# Patient Record
Sex: Female | Born: 1945 | Race: White | Hispanic: No | Marital: Married | Smoking: Never smoker
Health system: Southern US, Community
[De-identification: ages and names within clinical notes are randomized; demographics above are authoritative.]

## PROBLEM LIST (undated history)

## (undated) DIAGNOSIS — E785 Hyperlipidemia, unspecified: Secondary | ICD-10-CM

## (undated) DIAGNOSIS — K219 Gastro-esophageal reflux disease without esophagitis: Secondary | ICD-10-CM

## (undated) DIAGNOSIS — M2550 Pain in unspecified joint: Secondary | ICD-10-CM

## (undated) DIAGNOSIS — J189 Pneumonia, unspecified organism: Secondary | ICD-10-CM

## (undated) DIAGNOSIS — T7840XA Allergy, unspecified, initial encounter: Secondary | ICD-10-CM

## (undated) DIAGNOSIS — IMO0001 Reserved for inherently not codable concepts without codable children: Secondary | ICD-10-CM

## (undated) DIAGNOSIS — I10 Essential (primary) hypertension: Secondary | ICD-10-CM

## (undated) DIAGNOSIS — K648 Other hemorrhoids: Secondary | ICD-10-CM

## (undated) DIAGNOSIS — J45909 Unspecified asthma, uncomplicated: Secondary | ICD-10-CM

## (undated) DIAGNOSIS — R42 Dizziness and giddiness: Secondary | ICD-10-CM

## (undated) DIAGNOSIS — Z9889 Other specified postprocedural states: Secondary | ICD-10-CM

## (undated) DIAGNOSIS — D649 Anemia, unspecified: Secondary | ICD-10-CM

## (undated) DIAGNOSIS — R000 Tachycardia, unspecified: Secondary | ICD-10-CM

## (undated) DIAGNOSIS — M1990 Unspecified osteoarthritis, unspecified site: Secondary | ICD-10-CM

## (undated) DIAGNOSIS — R0602 Shortness of breath: Secondary | ICD-10-CM

## (undated) DIAGNOSIS — J383 Other diseases of vocal cords: Secondary | ICD-10-CM

## (undated) DIAGNOSIS — G4730 Sleep apnea, unspecified: Secondary | ICD-10-CM

## (undated) DIAGNOSIS — R112 Nausea with vomiting, unspecified: Secondary | ICD-10-CM

## (undated) DIAGNOSIS — E669 Obesity, unspecified: Secondary | ICD-10-CM

## (undated) DIAGNOSIS — K829 Disease of gallbladder, unspecified: Secondary | ICD-10-CM

## (undated) DIAGNOSIS — F329 Major depressive disorder, single episode, unspecified: Secondary | ICD-10-CM

## (undated) DIAGNOSIS — F32A Depression, unspecified: Secondary | ICD-10-CM

## (undated) DIAGNOSIS — K589 Irritable bowel syndrome without diarrhea: Secondary | ICD-10-CM

## (undated) DIAGNOSIS — E538 Deficiency of other specified B group vitamins: Secondary | ICD-10-CM

## (undated) DIAGNOSIS — I480 Paroxysmal atrial fibrillation: Secondary | ICD-10-CM

## (undated) DIAGNOSIS — M549 Dorsalgia, unspecified: Secondary | ICD-10-CM

## (undated) DIAGNOSIS — R1310 Dysphagia, unspecified: Secondary | ICD-10-CM

## (undated) DIAGNOSIS — K5790 Diverticulosis of intestine, part unspecified, without perforation or abscess without bleeding: Secondary | ICD-10-CM

## (undated) DIAGNOSIS — K5900 Constipation, unspecified: Secondary | ICD-10-CM

## (undated) DIAGNOSIS — Z9289 Personal history of other medical treatment: Secondary | ICD-10-CM

## (undated) DIAGNOSIS — R079 Chest pain, unspecified: Secondary | ICD-10-CM

## (undated) DIAGNOSIS — F419 Anxiety disorder, unspecified: Secondary | ICD-10-CM

## (undated) DIAGNOSIS — E559 Vitamin D deficiency, unspecified: Secondary | ICD-10-CM

## (undated) DIAGNOSIS — I200 Unstable angina: Secondary | ICD-10-CM

## (undated) HISTORY — DX: Dorsalgia, unspecified: M54.9

## (undated) HISTORY — DX: Anemia, unspecified: D64.9

## (undated) HISTORY — DX: Disease of gallbladder, unspecified: K82.9

## (undated) HISTORY — DX: Dizziness and giddiness: R42

## (undated) HISTORY — PX: ANKLE FUSION: SHX881

## (undated) HISTORY — DX: Vitamin D deficiency, unspecified: E55.9

## (undated) HISTORY — DX: Dysphagia, unspecified: R13.10

## (undated) HISTORY — DX: Constipation, unspecified: K59.00

## (undated) HISTORY — PX: ELBOW SURGERY: SHX618

## (undated) HISTORY — PX: TOTAL KNEE ARTHROPLASTY: SHX125

## (undated) HISTORY — PX: COLONOSCOPY: SHX174

## (undated) HISTORY — DX: Unspecified osteoarthritis, unspecified site: M19.90

## (undated) HISTORY — DX: Diverticulosis of intestine, part unspecified, without perforation or abscess without bleeding: K57.90

## (undated) HISTORY — PX: TOTAL HIP ARTHROPLASTY: SHX124

## (undated) HISTORY — DX: Shortness of breath: R06.02

## (undated) HISTORY — DX: Chest pain, unspecified: R07.9

## (undated) HISTORY — PX: WRIST SURGERY: SHX841

## (undated) HISTORY — PX: BACK SURGERY: SHX140

## (undated) HISTORY — DX: Deficiency of other specified B group vitamins: E53.8

## (undated) HISTORY — DX: Reserved for inherently not codable concepts without codable children: IMO0001

## (undated) HISTORY — DX: Irritable bowel syndrome, unspecified: K58.9

## (undated) HISTORY — PX: TOTAL ANKLE REPLACEMENT: SUR1218

## (undated) HISTORY — DX: Tachycardia, unspecified: R00.0

## (undated) HISTORY — PX: SHOULDER SURGERY: SHX246

## (undated) HISTORY — DX: Other hemorrhoids: K64.8

## (undated) HISTORY — DX: Obesity, unspecified: E66.9

## (undated) HISTORY — PX: CHOLECYSTECTOMY: SHX55

## (undated) HISTORY — DX: Allergy, unspecified, initial encounter: T78.40XA

## (undated) HISTORY — PX: JOINT REPLACEMENT: SHX530

## (undated) HISTORY — PX: OTHER SURGICAL HISTORY: SHX169

## (undated) HISTORY — DX: Pain in unspecified joint: M25.50

## (undated) HISTORY — DX: Paroxysmal atrial fibrillation: I48.0

## (undated) HISTORY — PX: FOOT OSTEOTOMY W/ PLANTAR FASCIA RELEASE: SHX1665

---
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
## 1986-03-22 HISTORY — PX: ABDOMINAL HYSTERECTOMY: SHX81

## 1995-03-23 HISTORY — PX: NASAL SINUS SURGERY: SHX719

## 1995-03-23 HISTORY — PX: ILIOTIBIAL BAND RELEASE: SHX675

## 1995-03-23 HISTORY — PX: OTHER SURGICAL HISTORY: SHX169

## 1997-06-20 ENCOUNTER — Ambulatory Visit (HOSPITAL_BASED_OUTPATIENT_CLINIC_OR_DEPARTMENT_OTHER): Admission: RE | Admit: 1997-06-20 | Discharge: 1997-06-20 | Payer: Self-pay | Admitting: *Deleted

## 1997-10-07 ENCOUNTER — Ambulatory Visit (HOSPITAL_COMMUNITY): Admission: RE | Admit: 1997-10-07 | Discharge: 1997-10-07 | Payer: Self-pay

## 1998-12-24 ENCOUNTER — Ambulatory Visit (HOSPITAL_COMMUNITY): Admission: RE | Admit: 1998-12-24 | Discharge: 1998-12-24 | Payer: Self-pay | Admitting: Orthopedic Surgery

## 1999-10-03 ENCOUNTER — Emergency Department (HOSPITAL_COMMUNITY): Admission: EM | Admit: 1999-10-03 | Discharge: 1999-10-03 | Payer: Self-pay | Admitting: Emergency Medicine

## 1999-10-03 ENCOUNTER — Encounter: Payer: Self-pay | Admitting: Emergency Medicine

## 2000-06-09 ENCOUNTER — Encounter: Payer: Self-pay | Admitting: Orthopedic Surgery

## 2000-06-14 ENCOUNTER — Inpatient Hospital Stay (HOSPITAL_COMMUNITY): Admission: RE | Admit: 2000-06-14 | Discharge: 2000-06-18 | Payer: Self-pay | Admitting: Orthopedic Surgery

## 2000-07-28 ENCOUNTER — Encounter: Admission: RE | Admit: 2000-07-28 | Discharge: 2000-07-28 | Payer: Self-pay | Admitting: Endocrinology

## 2000-07-28 ENCOUNTER — Encounter: Payer: Self-pay | Admitting: Endocrinology

## 2001-10-04 ENCOUNTER — Other Ambulatory Visit: Admission: RE | Admit: 2001-10-04 | Discharge: 2001-10-04 | Payer: Self-pay | Admitting: *Deleted

## 2002-09-07 ENCOUNTER — Encounter: Payer: Self-pay | Admitting: Gastroenterology

## 2002-09-07 ENCOUNTER — Ambulatory Visit (HOSPITAL_COMMUNITY): Admission: RE | Admit: 2002-09-07 | Discharge: 2002-09-07 | Payer: Self-pay | Admitting: Gastroenterology

## 2004-03-12 ENCOUNTER — Ambulatory Visit (HOSPITAL_COMMUNITY): Admission: RE | Admit: 2004-03-12 | Discharge: 2004-03-12 | Payer: Self-pay | Admitting: Endocrinology

## 2005-03-31 ENCOUNTER — Ambulatory Visit (HOSPITAL_COMMUNITY): Admission: RE | Admit: 2005-03-31 | Discharge: 2005-03-31 | Payer: Self-pay | Admitting: Orthopedic Surgery

## 2005-07-23 ENCOUNTER — Encounter: Admission: RE | Admit: 2005-07-23 | Discharge: 2005-07-23 | Payer: Self-pay | Admitting: Neurosurgery

## 2006-07-25 ENCOUNTER — Encounter: Admission: RE | Admit: 2006-07-25 | Discharge: 2006-07-25 | Payer: Self-pay | Admitting: Orthopedic Surgery

## 2007-10-31 ENCOUNTER — Encounter: Admission: RE | Admit: 2007-10-31 | Discharge: 2007-10-31 | Payer: Self-pay | Admitting: Endocrinology

## 2008-01-17 ENCOUNTER — Ambulatory Visit (HOSPITAL_COMMUNITY): Admission: RE | Admit: 2008-01-17 | Discharge: 2008-01-17 | Payer: Self-pay | Admitting: Gastroenterology

## 2008-05-07 ENCOUNTER — Encounter (INDEPENDENT_AMBULATORY_CARE_PROVIDER_SITE_OTHER): Payer: Self-pay | Admitting: General Surgery

## 2008-05-07 ENCOUNTER — Ambulatory Visit (HOSPITAL_COMMUNITY): Admission: RE | Admit: 2008-05-07 | Discharge: 2008-05-07 | Payer: Self-pay | Admitting: General Surgery

## 2008-07-25 ENCOUNTER — Encounter: Admission: RE | Admit: 2008-07-25 | Discharge: 2008-07-25 | Payer: Self-pay | Admitting: Orthopedic Surgery

## 2010-02-17 ENCOUNTER — Encounter: Admission: RE | Admit: 2010-02-17 | Discharge: 2010-02-17 | Payer: Self-pay | Admitting: Allergy and Immunology

## 2010-04-08 ENCOUNTER — Encounter
Admission: RE | Admit: 2010-04-08 | Discharge: 2010-04-21 | Payer: Self-pay | Source: Home / Self Care | Attending: Otolaryngology | Admitting: Otolaryngology

## 2010-04-24 ENCOUNTER — Ambulatory Visit: Payer: BC Managed Care – PPO | Attending: Otolaryngology

## 2010-04-24 DIAGNOSIS — IMO0001 Reserved for inherently not codable concepts without codable children: Secondary | ICD-10-CM | POA: Insufficient documentation

## 2010-04-24 DIAGNOSIS — R498 Other voice and resonance disorders: Secondary | ICD-10-CM | POA: Insufficient documentation

## 2010-06-09 ENCOUNTER — Encounter (HOSPITAL_COMMUNITY)
Admission: RE | Admit: 2010-06-09 | Discharge: 2010-06-09 | Disposition: A | Discharge status: Home / Self Care | Payer: BC Managed Care – PPO | Source: Ambulatory Visit | Attending: Orthopedic Surgery | Admitting: Orthopedic Surgery

## 2010-06-09 LAB — DIFFERENTIAL
Basophils Relative: 0 % (ref 0–1)
Eosinophils Relative: 2 % (ref 0–5)
Lymphs Abs: 1.6 10*3/uL (ref 0.7–4.0)
Monocytes Absolute: 0.2 10*3/uL (ref 0.1–1.0)
Monocytes Relative: 5 % (ref 3–12)
Neutrophils Relative %: 59 % (ref 43–77)

## 2010-06-09 LAB — CBC
Hemoglobin: 12.7 g/dL (ref 12.0–15.0)
MCHC: 32.9 g/dL (ref 30.0–36.0)
MCV: 88.3 fL (ref 78.0–100.0)
RBC: 4.37 MIL/uL (ref 3.87–5.11)
WBC: 4.7 10*3/uL (ref 4.0–10.5)

## 2010-06-09 LAB — COMPREHENSIVE METABOLIC PANEL
AST: 25 U/L (ref 0–37)
Alkaline Phosphatase: 80 U/L (ref 39–117)
BUN: 14 mg/dL (ref 6–23)
Calcium: 9.3 mg/dL (ref 8.4–10.5)
GFR calc Af Amer: >60 mL/min (ref >60)
Glucose, Bld: 99 mg/dL (ref 70–99)
Total Bilirubin: 0.4 mg/dL (ref 0.3–1.2)
Total Protein: 6.2 g/dL (ref 6.0–8.3)

## 2010-06-09 LAB — URINALYSIS, ROUTINE W REFLEX MICROSCOPIC
Bilirubin Urine: NEGATIVE
Ketones, ur: NEGATIVE mg/dL
Leukocytes, UA: NEGATIVE
Nitrite: NEGATIVE
pH: 5.5 (ref 5.0–8.0)

## 2010-06-09 LAB — PROTIME-INR: INR: 0.91 (ref 0.00–1.49)

## 2010-06-09 LAB — URINE MICROSCOPIC-ADD ON

## 2010-06-09 LAB — ABO/RH: ABO/RH(D): A POS

## 2010-06-10 LAB — URINE CULTURE

## 2010-06-12 ENCOUNTER — Inpatient Hospital Stay (HOSPITAL_COMMUNITY): Payer: BC Managed Care – PPO

## 2010-06-12 ENCOUNTER — Inpatient Hospital Stay (HOSPITAL_COMMUNITY)
Admission: RE | Admit: 2010-06-12 | Discharge: 2010-06-15 | DRG: 209 | Disposition: A | Discharge status: Home Health | Payer: BC Managed Care – PPO | Source: Ambulatory Visit | Attending: Orthopedic Surgery | Admitting: Orthopedic Surgery

## 2010-06-12 DIAGNOSIS — I10 Essential (primary) hypertension: Secondary | ICD-10-CM | POA: Diagnosis present

## 2010-06-12 DIAGNOSIS — K219 Gastro-esophageal reflux disease without esophagitis: Secondary | ICD-10-CM | POA: Diagnosis present

## 2010-06-12 DIAGNOSIS — J449 Chronic obstructive pulmonary disease, unspecified: Secondary | ICD-10-CM | POA: Diagnosis present

## 2010-06-12 DIAGNOSIS — M1710 Unilateral primary osteoarthritis, unspecified knee: Principal | ICD-10-CM | POA: Diagnosis present

## 2010-06-12 DIAGNOSIS — J4489 Other specified chronic obstructive pulmonary disease: Secondary | ICD-10-CM | POA: Diagnosis present

## 2010-06-12 DIAGNOSIS — Z6841 Body Mass Index (BMI) 40.0 and over, adult: Secondary | ICD-10-CM

## 2010-06-12 DIAGNOSIS — R32 Unspecified urinary incontinence: Secondary | ICD-10-CM | POA: Diagnosis present

## 2010-06-13 LAB — CBC
MCH: 28.9 pg (ref 26.0–34.0)
MCHC: 32.3 g/dL (ref 30.0–36.0)
MCV: 89.4 fL (ref 78.0–100.0)
RBC: 3.39 MIL/uL — ABNORMAL LOW (ref 3.87–5.11)

## 2010-06-13 LAB — BASIC METABOLIC PANEL
Calcium: 8 mg/dL — ABNORMAL LOW (ref 8.4–10.5)
Chloride: 106 mEq/L (ref 96–112)
GFR calc non Af Amer: >60 mL/min (ref >60)
Potassium: 4.4 mEq/L (ref 3.5–5.1)

## 2010-06-14 LAB — CBC
Hemoglobin: 8.8 g/dL — ABNORMAL LOW (ref 12.0–15.0)
MCH: 29.4 pg (ref 26.0–34.0)
MCHC: 33.1 g/dL (ref 30.0–36.0)
MCV: 89 fL (ref 78.0–100.0)
Platelets: 96 10*3/uL — ABNORMAL LOW (ref 150–400)
RBC: 2.99 MIL/uL — ABNORMAL LOW (ref 3.87–5.11)
RDW: 13.2 % (ref 11.5–15.5)
WBC: 5.4 10*3/uL (ref 4.0–10.5)

## 2010-06-14 LAB — BASIC METABOLIC PANEL
BUN: 3 mg/dL — ABNORMAL LOW (ref 6–23)
CO2: 29 mEq/L (ref 19–32)
Calcium: 7.7 mg/dL — ABNORMAL LOW (ref 8.4–10.5)
Creatinine, Ser: 0.73 mg/dL (ref 0.4–1.2)
GFR calc non Af Amer: >60 mL/min (ref >60)
Potassium: 3.5 mEq/L (ref 3.5–5.1)

## 2010-06-15 LAB — CBC
Hemoglobin: 9.8 g/dL — ABNORMAL LOW (ref 12.0–15.0)
MCH: 28.8 pg (ref 26.0–34.0)
MCHC: 32.9 g/dL (ref 30.0–36.0)
Platelets: 108 10*3/uL — ABNORMAL LOW (ref 150–400)
RBC: 3.4 MIL/uL — ABNORMAL LOW (ref 3.87–5.11)
RDW: 13.5 % (ref 11.5–15.5)

## 2010-06-15 LAB — TYPE AND SCREEN
ABO/RH(D): A POS
Antibody Screen: NEGATIVE

## 2010-06-15 LAB — BASIC METABOLIC PANEL
BUN: 5 mg/dL — ABNORMAL LOW (ref 6–23)
CO2: 29 mEq/L (ref 19–32)
Chloride: 104 mEq/L (ref 96–112)
Creatinine, Ser: 0.73 mg/dL (ref 0.4–1.2)
Glucose, Bld: 116 mg/dL — ABNORMAL HIGH (ref 70–99)
Potassium: 3.5 mEq/L (ref 3.5–5.1)

## 2010-07-07 LAB — URINALYSIS, ROUTINE W REFLEX MICROSCOPIC
Bilirubin Urine: NEGATIVE
Glucose, UA: NEGATIVE mg/dL
Ketones, ur: NEGATIVE mg/dL
Protein, ur: NEGATIVE mg/dL
pH: 5.5 (ref 5.0–8.0)

## 2010-07-07 LAB — URINE MICROSCOPIC-ADD ON

## 2010-07-07 LAB — COMPREHENSIVE METABOLIC PANEL
ALT: 14 U/L (ref 0–35)
Albumin: 3.5 g/dL (ref 3.5–5.2)
Alkaline Phosphatase: 79 U/L (ref 39–117)
BUN: 16 mg/dL (ref 6–23)
Chloride: 107 mEq/L (ref 96–112)
Glucose, Bld: 101 mg/dL — ABNORMAL HIGH (ref 70–99)
Potassium: 4.1 mEq/L (ref 3.5–5.1)
Sodium: 138 mEq/L (ref 135–145)
Total Bilirubin: 0.5 mg/dL (ref 0.3–1.2)
Total Protein: 6 g/dL (ref 6.0–8.3)

## 2010-07-07 LAB — CBC
HCT: 36.9 % (ref 36.0–46.0)
Hemoglobin: 12.6 g/dL (ref 12.0–15.0)
RDW: 12.7 % (ref 11.5–15.5)
WBC: 5.4 10*3/uL (ref 4.0–10.5)

## 2010-07-07 LAB — DIFFERENTIAL
Basophils Absolute: 0 10*3/uL (ref 0.0–0.1)
Basophils Relative: 1 % (ref 0–1)
Eosinophils Absolute: 0.1 10*3/uL (ref 0.0–0.7)
Monocytes Absolute: 0.3 10*3/uL (ref 0.1–1.0)
Monocytes Relative: 6 % (ref 3–12)
Neutrophils Relative %: 56 % (ref 43–77)

## 2010-07-20 NOTE — Op Note (Signed)
NAMESARYA, LINENBERGER             ACCOUNT NO.:  [id number]  MEDICAL RECORD NO.:  [id number]           PATIENT TYPE:  I  LOCATION:  5039                         FACILITY:  [facility]  PHYSICIAN:  Dyke Brackett, M.D.    DATE OF BIRTH:  [birth year]  DATE OF PROCEDURE:  06/12/2010 DATE OF DISCHARGE:                              OPERATIVE REPORT   PREOPERATIVE DIAGNOSES: 1. Severe osteoarthritis valgus deformity, right knee. 2. Morbid obesity with body mass index greater than 40.  POSTOPERATIVE DIAGNOSES: 1. Severe osteoarthritis valgus deformity, right knee. 2. Morbid obesity with body mass index greater than 40.  OPERATION:  Right total knee replacement (DePuy Sigma knee cemented size 3 femur, tibia, 10-mm bearing, 3 peg all poly patella).  SURGEON:  Dyke Brackett, MD  ASSISTANTSu Hilt, PA  TOURNIQUET TIME:  1 hour and 25 minutes.  DESCRIPTION OF PROCEDURE:  The patient was noted to be technically more difficult secondary to a significant elevation of her BMI as indicated above made the technical difficulty of the case higher, however, we did not have any significant complications throughout the case.  She was approached through a straight skin incision, medial parapatellar approach to the knee.  A straight skin incision was made with medial parapatellar approach.  We identified the most diseased lateral compartment.  We first placed a hole in the femur with placement of the distal femoral cutting guide, cut with 10 mm resection with minimal cutting of the deficient lateral condyle.  Attention was next directed to the tibia.  Using the external guide, we set the appropriate degree with valgus and we actually decided to use a 3-degree valgus for the femoral cut, cut about 2 mm below the most diseased lateral compartment after excising the anterior horns of menisci and a 2-degree slope for the MBT tray.  I completed this cut without difficulty, then checked the extension gap  which was symmetric at 10 mm with full extension.  We next resected remnants of the menisci and the posterior cruciate ligament was sacrificed.  The femur was trialed to size 3, followed by placement of the guide through the femur.  We then placed a C clamp after fixing the provisional cutting guide for the pin holes to the flange in the femur making care that this was set not to notch the femur.  It did set the anterior and posterior cutting.  We then placed the C clamp to set the rotation and external rotation.  Two pins were placed followed by replacement back guide with the 5-in-1 cutting guide. The 5-in-1 cutting guide was used to resect anterior and posterior femoral with chamfer cuts.  We then put a cruciate retaining trial to check and then we did have some posterior bone particularly on the lateral side that we resected, recessed the capsule and removed several loose bodies from the posterior medial and intercondylar area of the knee.  On the tibial side, we elected due to the patient's high BMI to do revision MBT tray.  We sized it to be 3, placed the keel punch for the revision tray, followed by putting the trial in  the tibia.  The patella itself showed significant wear with almost deficiency on the lateral side making it somewhat difficult to retain enough lateral thickness on the patella.  Therefore, we did a freehand technique trying to preserve about 15-16 mm of the patella to make a total of patellar thickness based on the original thickness of 25.  We placed a  38-mm patella trial once this was done and checked all parameters with all trials.  Full extension and flexion was noted to be stable.  No tendency to bearing spin-out was noted and good balance.  Trials were removed.  The bony surfaces were irrigated and the final components were put in with the tourniquet up, tibia followed by femur and patella.  We did elect to put a trial bearing back in.  Cement  was allowed to harden.  Once the cement was hardened, we removed the trial peg and trial bearing.  Tourniquet was released.  We first for any excess bone in the back and did not see any.  Then, the tourniquet was released.  No excess bleeding was noted in the posterior aspect of the knee.  All bleeders were coagulated.  Closure was effected after Hemovac drain placed superolaterally and we closed with #1 Ethibond to the capsule, 0 and 2-0 Vicryl, skin clips and placed some Marcaine with epinephrine into the skin.  She also had a preoperative femoral nerve block.     Dyke Brackett, M.D.     WDC/MEDQ  D:  06/12/2010  T:  06/13/2010  Job:  [id number]  Electronically Signed by W. Jamara Vary M.D. on 07/20/2010 05:11:13 PM

## 2010-08-04 NOTE — Op Note (Signed)
NAMEJIMMY, Green             ACCOUNT NO.:  [id number]   MEDICAL RECORD NO.:  [id number]          PATIENT TYPE:  AMB   LOCATION:  DAY                          FACILITY:  [facility]   PHYSICIAN:  Sharlet Salina T. Hoxworth, M.D.DATE OF BIRTH:  Jul 25, 1945   DATE OF PROCEDURE:  05/07/2008  DATE OF DISCHARGE:                               OPERATIVE REPORT   PREOPERATIVE DIAGNOSIS:  Biliary dyskinesia/chronic cholecystitis.   POSTOPERATIVE DIAGNOSIS:  Biliary dyskinesia/chronic cholecystitis.   SURGICAL PROCEDURE:  Laparoscopic cholecystectomy with intraoperative  cholangiogram.   ASSISTANT:  Velora Heckler, M.D.   ANESTHESIA:  General.   BRIEF HISTORY:  [name] is a 65 year old female who presents  with persistent episodic right upper quadrant and epigastric abdominal  pain associated with nausea and vomiting.  Her symptoms are very typical  for severe biliary colic, although she has had a negative abdominal  ultrasound and CT scan of the abdomen.  Due to persistent symptoms  despite negative workup, she is felt to have biliary dyskinesia or  chronic cholecystitis and laparoscopic cholecystectomy with  cholangiogram has been recommended in an effort to relieve her symptoms.  The nature of the procedure, its indications, risks of anesthetic  complications, bleeding, infection, bile leak, bile duct injury, and  failure to relieve her symptoms have been discussed and understood.  She  is now brought to the operating room for this procedure.   DESCRIPTION OF OPERATION:  The patient was brought to the operating  room, placed in supine position on the operating table, and general  endotracheal anesthesia was induced.  The abdomen was widely sterilely  prepped and draped.  PAS's were in place.  She received preoperative IV  antibiotics.  Correct patient and procedure were verified.  Local  anesthesia was used to infiltrate the trocar sites.  A 1.5-cm incision  was made at the umbilicus  and dissection carried down to the midline  fascia, which was sharply incised for 1 cm transversely and the  peritoneum entered under direct vision.  Through a mattress suture of 0  Vicryl, the Hasson trocar was placed and pneumoperitoneum established.  Under direct vision, an 11-mm trocar was placed in the epigastrium and  two 5-mm trocars along the right subcostal margin.   The gallbladder was exposed and grossly did not have evidence of  inflammation.  The fundus was grasped and elevated up over the liver and  the infundibulum retracted inferolaterally.  Peritoneum anterior and  posterior to Calot triangle was incised and fibrofatty tissue was  stripped off the neck of the gallbladder toward the porta hepatis.  The  distal gallbladder was thoroughly dissected.  The anterior branch of the  cystic artery was clearly identified coursing up to the gallbladder wall  and was divided between 2 proximal and 1 distal clip.   The cystic duct was then identified and dissected free, Calot triangle  skeletonized, and the cystic duct/gallbladder junction dissected 360  degrees.  When the anatomy appeared clear, the cystic duct was clipped  at the gallbladder junction and an operative cholangiogram obtained  through the cystic duct.  This showed good  visualization of a normal  common bile duct and intrahepatic ducts with free flow into the duodenum  and no filling defects.  Following this, the cholangiocath was removed  and the cystic duct was doubly clipped proximally and divided.  The  gallbladder was then dissected free from its bed using hook cautery.  A  posterior branch of the cystic artery well up on the gallbladder bed was  clipped.  The gallbladder was dissected off the liver intact and removed  through the umbilicus.   Complete hemostasis was assured.  Trocars were removed under direct  vision and all CO2 evacuated.  The mattress suture was secured to the  umbilicus.  Skin incisions were  closed with subcuticular Monocryl and  Dermabond.  Sponge, needle, and instrument counts were correct.  The  patient was taken to recovery in good condition.      [name]. Hoxworth, M.D.  Electronically Signed     BTH/MEDQ  D:  05/07/2008  T:  05/07/2008  Job:  [id number]

## 2010-08-06 NOTE — Discharge Summary (Signed)
[name], [name]             ACCOUNT NO.:  [id number]  MEDICAL RECORD NO.:  [id number]           PATIENT TYPE:  I  LOCATION:  5039                         FACILITY:  [facility]  PHYSICIAN:  Dyke Brackett, M.D.    DATE OF BIRTH:  [birth year]  DATE OF ADMISSION:  06/12/2010 DATE OF DISCHARGE:  06/15/2010                              DISCHARGE SUMMARY   DIAGNOSIS:  Severe osteoarthritis right knee.  SECONDARY DIAGNOSES: 1. Morbid obesity with BMI greater than 40. 2. Asthma/chronic obstructive pulmonary disease. 3. Urinary retention, 4. Vocal cord injury. 5. Hypertension. 6. Gastroesophageal reflux disease.  PROCEDURE IN HOSPITAL:  Right total knee arthroplasty.  DISCHARGE SUMMARY:  The patient is a 65 year old woman with a previous left total knee arthroplasty for severe osteoarthritis of her knee.  She has a many year history of bilateral knee pain and after undergoing her successful left total knee arthroplasty in 2002 has been considering one on the right.  Her pain has now increased in the right knee to the point where it interferes with activities of daily living, interferes with sleep and is worsening making ambulation difficult and at times unsafe. She has failed conservative care,  rest and physical therapy and wishes to proceed with a total knee arthroplasty after discussing risks versus benefits.  MEDICAL HISTORY:  Significant for osteoarthritis, asthma, COPD, urinary retention, vocal cord injury with resulting speech difficulties that has been cleared for surgical procedure as far as relative risks of anesthesia, hypertension and GERD.  SURGICAL HISTORY:  Three surgeries on the right foot, plantar fascial release, right knee scope, left knee scope, left total knee arthroplasty, left hip scope, right thumb joint replacement, left wrist procedure, hysterectomy, sinus surgery, varicose vein stripping and lumbar disk surgeries, gallbladder, left shoulder surgery and  fatty tumor removal from left leg as teenager.  ALLERGIES:  CORTISONE INJECTIONS.  She can tolerate cortisone orally and she had a GI bleed from taking ANTI-INFLAMMATORIES.  PRIMARY MD:  Dr. Clelia Croft and she sees Dr. Coralyn Mark at [facility] for her vocal cord injury.  REVIEW OF SYSTEMS:  Positive for bridge work in her upper teeth, glasses and history of IBS.  PHYSICAL EXAMINATION:  VITAL SIGNS:  The patient's temperature 97.9, pulse of 85, blood pressure 138/95.  She is 5 feet 3-1/2 inch, 246 woman. HEAD:  Normocephalic, atraumatic. NECK:  Full range of motion. EYES:  Pupils equal, round and reactive to light and accommodation. NARES:  Pink. EARS: TMs are clear. CHEST:  Lungs clear to auscultation. CARDIAC:  Regular rate and rhythm. ABDOMEN:  Soft, nontender, obese. NEUROVASCULAR:  Intact with exception of her right foot which has decreased sensation along the lateral foot. SKIN:  Multiple scars from surgeries but no other abnormalities. MUSCULOSKELETAL:  Right knee range of motion 5-110 degrees with stable ligaments, positive crepitus, questionable effusion, 5-10 degrees varus deformity.  X-rays show bone-on-bone lateral compartment with osteophytes throughout.  MEDICATIONS:  At the time of admission: 1. Hydrocodone 1 tablet as needed at bedtime. 2. Tylenol No. 3 every 8 hours as needed for pain. 3. Tussionex Extended oral release suspension for cough. 4. Zyrtec 10 mg one p.o.  daily as needed. 5. Clorazepate 7.5 mg 1 tablet daily as needed. 6. ProAir albuterol inhaler 2 puffs every 4 hours as needed. 7. QVAR inhalation treatment 40 mg dose 2 puffs twice daily. 8. Nasonex nasal spray, 1 spray twice daily. 9. Ranitidine 300 mg p.o. daily at bedtime. 10.Omeprazole 20 mg p.o. b.i.d. 11.Simvastatin 1 tablet q.a.m. 12.Losartan 50 mg p.o. q.a.m.  The patient's preoperative labs including CBC, CMET, chest x-ray, EKG, PT and PTT were within acceptable levels.  On the day of admission,  the patient underwent a right total knee arthroplasty using DePuy Sigma cemented size 3 femur, tibia with a 10-mm bearing and a 3-peg all poly patella.  She was placed on perioperative antibiotics.  She was placed on postoperative prophylaxis using Lovenox.  Physical Therapy was begun in the recovery room with a CPM.  She was placed on CPM postoperatively for 16 hours per day first postoperative day.  HOSPITAL COURSE:  Continues the first postoperative day the patient complained of moderate pain.  Hemoglobin 9.8, WBC of 5.8, T-max 99.0, blood pressure 120/73.  She was alert and oriented x3.  She was neurovascularly intact at her baseline level.  Calf was soft and nontender and she was tolerating a CPM well.  She showed no signs or symptoms from her acute blood loss anemia but continued to monitor. Physical Therapy was continued.  Weightbearing as tolerated. Postoperative day #2, the patient's T-max 98.2.  Pain had decreased. Hemoglobin 8.8.  Alert and oriented x3, neurovascularly intact at her baseline level 0 to 90 degrees with CPM, otherwise, stable, continuing postoperative course.  Postoperative day #3, the patient pain's level had settled to 3/10.  She was tolerating her diet, voiding without difficulty, was not having bowel yet did have positive flatus without pain or distention.  Hemoglobin 9.8, blood pressure 109/31.  WBC 6.3. Wound was clean and dry.  Calf was soft and nontender.  She continued to tolerate a CPM and physical therapy and she was medically stable and improving with physical therapy and medically she was discharged home to the care of her family on that day.  She will return to see Dr. Madelon Lips in 10 days time for follow-up appointment or sooner should she have any increasing pain and the drainage from the wound or temperature greater than 101.  She will continue with physical therapy at home with home health PT as well as CPM 6-8 hours per day, 0-90 degrees.   Weightbearing as tolerated using a walker.  Dressing changes daily or as needed remained clean and dry.  MEDICATIONS:  At the time of discharge: 1. Cepacol throat lozenges as needed for sore throat. 2. Docusate sodium 100 mg b.i.d. 3. Lovenox 30 mg injection subcu b.i.d. for a total of 10 days     postoperatively. 4. Methocarbamol 500 mg p.o. q.6 hours as needed for spasm. 5. Percocet 5/325 1-2 tablets by mouth every 4 hours as needed for     pain. 6. Senokot 1 tablet by mouth at bedtime as needed for constipation. 7. Clorazepate 7.5 mg one p.o. daily as needed for anxiety. 8. Losartan 50 mg one p.o. daily q.a.m. 9. Nasonex nasal spray 1 spray nasally twice daily. 10.Omeprazole 20 mg 1 capsule by mouth b.i.d. 11.Albuterol inhaler 2 puffs inhaled every 4 hours as needed. 12.QVAR inhalation 2 puffs inhaled twice daily. 13.Ranitidine one p.o. at bedtime. 14.Simvastatin 20 mg one p.o. q.a.m. 15.Tussionex Extended Release oral suspension 1-2 teaspoons by mouth     every 12 hours  as needed for cough. 16.Zyrtec 10 mg 1 tablet daily as needed.     Laural Benes. Jannet Mantis   ______________________________ Dyke Brackett, M.D.    JBR/MEDQ  D:  07/22/2010  T:  07/23/2010  Job:  [id number]  Electronically Signed by Dan Humphreys P.A. on 08/03/2010 01:29:32 PM Electronically Signed by Lacretia Nicks. Glenda Kunst M.D. on 08/06/2010 11:13:00 AM

## 2010-08-07 NOTE — Discharge Summary (Signed)
[place]  Patient:    [name], [name]                    MRN: [id number] Adm. Date:  [id number] Disc. Date: [id number] Attending:  Cornell Barman Dictator:   Jamelle Rushing, P.A.                           Discharge Summary  ADMISSION DIAGNOSES: 1. Osteoarthritis of bilateral knees, left greater than right. 2. Hypertension. 3. Obesity. 4. Asthma. 5. Chronic pain.  DISCHARGE DIAGNOSES: 1. Left total knee arthroplasty. 2. Postoperative blood loss anemia. 3. Hypertension. 4. Obesity. 5. Asthma. 6. Esophageal reflux.  HISTORY OF PRESENT ILLNESS:  The patient is a 65 year old white female with a history of five to six years of off and on bilateral knee pain.  The patient had her knees go out in September of 2000 as a result of an injury and had arthroscopic evaluation and debridement with slight improvement.  The pain gradually returned over time.  The patient did have Hyalgan injections bilaterally with improvement in the right knee, but none in the left.  As time continued, the pain did significantly worsen.  A second attempt with Hyalgan injections gave her only short-term improvement.  The patient describes the pain currently as a constant ache with occasional knife-like stabbing pains. The pain is present in the posterolateral and in the anterior middle part of the knee with no radiation.  She does have a sensation like the knee will give out.  She does have popping, grinding, and swelling of the knees.  She is currently using a cane and crutches on a p.r.n. basis.  ALLERGIES:  No known drug allergies.  CURRENT MEDICATIONS: 1. Celebrex 100 mg p.o. b.i.d. 2. Paxil. 3. Atenolol. 4. Darvocet. 5. Esterase patch. 6. Prilosec. 7. Claritin. 8. Albuterol multidose inhaler.  SURGICAL PROCEDURE:  On June 14, 2000, the patient was taken to the OR by Thereasa Distance A. Chaney Malling, M.D., assisted by Jamelle Rushing, P.A.  Under  general anesthesia, the patient had a left total knee arthroplasty performed.  The patient had the following components glued in:  A standard sized left femoral component with a #3 cemented tibial keel tray with a 12.5 LSC standard sized insert and a three-peg cemented patellar rotating standard size.  The patient tolerated the procedure well.  There were no complications.  One Hemovac drain was left in place.  CONSULTS:  On June 14, 2000, the patient had the following routine consults requested:  Physical therapy, occupational therapy, rehabilitation, care management, and pharmacy for dosing of Lovenox for DVT prophylaxis.  HOSPITAL COURSE:  On June 14, 2000, the patient was admitted to [facility] under the care of O'Neill A. Chaney Malling, M.D.  The patient was taken to the OR where a left total knee arthroplasty was performed.  The patient tolerated the procedure well and was transferred to the recovery room and then to the orthopedic floor for further recuperative phases.  The patient then incurred a four-day postoperative course in which she did develop some postoperative blood loss anemia on postoperative day #1 with her hemoglobin and hematocrit dropping to 7.7 and 23.0, respectively.  The patient was transfused two units of autologous blood without any complications.  The patient worked well with physical therapy.  She tolerated the CPM very well. Her leg remained neuromuscular and vascularly intact and her wound benign. She  did not develop a Homans sign.  There were no other significant complications or problems incurred.  It was felt that on postoperative day #4 the patient had progressed nicely and she was safe to be able to be discharged to home with continued home health physical therapy.  So arrangements were made and she was discharged on postoperative day #4 to home.  LABORATORY DATA:  The EKG on admission was normal sinus rhythm with nonspecific T-wave  abnormalities at 84 beats per minute.  The CBC on June 17, 2000, showed WBC 6.1, hemoglobin 8.6, hematocrit 25.2, and platelets 145.  Routine chemistries on June 16, 2000, were sodium of 135, potassium of 3.6, glucose 148, BUN 11, and creatinine 0.9.  Routine urinalysis on admission was normal.  The patient received a total of two units of autologous blood during hospitalization.  MEDICATIONS ON DISCHARGE FROM THE ORTHOPEDIC FLOOR:  1. Tylenol 650 mg p.o. q.4h. p.r.n. temperature.  2. Albuterol MDI two puffs q.6h. p.r.n.  3. Tenormin 25 mg p.o. q.h.s.  4. Beclomethasone inhaler one puff q.d.  5. Dulcolax 10 mg p.o. q.d.  6. Colace 100 mg p.o. b.i.d.  7. Lovenox 300 mg subqu b.i.d.  8. Robaxin 500 mg p.o. q.6-8h. p.r.n. spasm.  9. Reglan 10 mg p.o. q.8h. p.r.n. 10. OxyContin CR 10 mg p.o. q.h.s. 11. Percocet one or two tablets every four to six hours p.r.n. pain. 12. Protonix 40 mg p.o. q.d. 13. Paxil 20 mg p.o. q.d. 14. Fleets enema p.r.n. 15. Restoril 15 mg p.o. q.h.s. p.r.n.  DISCHARGE INSTRUCTIONS: 1. Discharge medications:  The patient is to continue routine home    medications.    a. OxyContin CR 10 mg p.o. q.h.s.    b. Percocet one or two tablets every four to six hours for pain if needed.    c. Colace 100 mg p.o. b.i.d.    d. Lovenox per home health.    e. Atenolol 25 mg once a day. 2. Activity:  No restrictions. 3. Diet: No restrictions. 4. Wound care:  Keep the dressing dry. 5. Follow-up:  Call for a follow-up appointment two weeks from surgery.  CONDITION ON DISCHARGE TO HOME:  Listed as good. DD:  07/19/00 TD:  07/19/00 Job: [id number]

## 2010-08-07 NOTE — H&P (Signed)
[place]  Patient:    [name], [name]                 MRN: [id number] Adm. Date:  06/14/00 Attending:  Thereasa Distance A. Chaney Malling, M.D. Dictator:   Jamelle Rushing, P.A.                         History and Physical  DATE OF BIRTH:  [birth year]  CHIEF COMPLAINT:  Left knee pain for approximately 6-7 years.  HISTORY OF PRESENT ILLNESS:  Patient is a 65 year old white female with a 6-7 year history of off-and-on knee pain.  The patient had an injury in September 2000 in which her knee collapsed.  The patient had an arthroscopic debridement with some slight improvement.  Initially the patient did have some pain in bilateral knees so Hyalgan injections were attempted in both knees.  She had significant improvement in the right knee but no improvement in the left knee and the injections were discontinued after the third injection.  The patient continued to have worsening of her pain until June 2001 so Hyalgan injections once again were attempted and the patient did have some improvement of the pain in her left knee with the Hyalgan.  The pain once again progressively returned and it described as a constant aching sensation with occasional knife-like quality.  The patient can make the pain improve with manipulation of her knee.  The pain is primarily located in the posterolateral aspect but will radiate around to the anterior middle part of the knee with no radiation up or down the leg.  The patient does have a sensation that occasionally the knee will give out.  She does have a significant sharp popping quality to it with some constant grinding when she bends over to pick up something off the floor.  The patient has been using a cane since about June 2001 and she occasionally goes to a crutch when the pain is at its worst.  DRUG ALLERGIES:  No known drug allergies.  CURRENT MEDICATIONS:  Celebrex, Paxil, atenolol, Darvocet, Estrace patch, Prilosec,  Claritin, Albuterol MDI.  Patient will provide the dosages and scheduling on admission to the hospital.  MEDICAL HISTORY:  Patient has been diagnosed with hypertension over the last three years and initially was started on Maxzide but currently managed with the atenolol with fairly good results.  Patient is also diagnosed with asthma which is exacerbated with upper respiratory infections and exercise.  The patient has never been hospitalized or intubated to treat the asthma.  The patient is taking Paxil due to some chronic pains.  The patient specifically denies any diabetes, thyroid disease, hiatal hernia, peptic ulcers, or heart disease.  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  Patient denies any complications with any of the above-mentioned surgical procedures.  SOCIAL HISTORY:  Patient is a 65 year old white obese female who denies any history of smoking or alcohol use.  The patient is currently married with two grown children.  She does live in a one-story house and she is currently employed as a Programmer, systems.  FAMILY PHYSICIAN:  Dr. Alfonse Alpers. [place], [phone].  FAMILY MEDICAL HISTORY:  Mother is alive at 49 years of age with insulin-dependent  diabetes, hypertension, significant osteoarthritis.  The father is deceased at 69 from esophageal cancer.  Patient has one sister alive and in good health.  REVIEW OF SYSTEMS:  Positive for increased weight of 30 pounds since last June.  She contributes this to lack of activity due to significant problems with her right ankle fusion surgery and inability to exercise due to her left knee.  Patient does wear contact lenses and glasses.  Patient does have occasional shortness of breath with exertion she contributes to  her significant weight and deconditioning.  Patient does have occasional reflux which is well treated with the Prilosec.  Patient does have occasional increased urinary frequency which has improved since being taken off the Maxzide.  PHYSICAL EXAMINATION:  VITAL SIGNS:  Weight 300 pounds, height 5 feet 4 inches.  Pulse 80, respirations 12, temperature 98.4, blood pressure 128/82.  GENERAL:  This is a very pleasant, well-groomed, obese white female.  She does ambulate with the use of a cane in her right hand.  She does get on and off the exam table with a little bit of assistance but no significant discomfort. Patient did have some very slight shortness of breath upon entering the exam room from ambulation from the front lobby.  HEENT:  Head is normocephalic, atraumatic, nontender over maxillary or frontal sinuses.  Pupils are equal, round, and reactive, accommodating to light. Extraocular movements are intact.  Sclerae are not icteric.  Conjunctivae are pink and moist.  External ears are without deformities, canals patent, TMs pearly gray and intact.  Gross hearing is intact.  Nasal septum was midline. Mucous membranes pink and moist, no polyps noted.  Oral buccal mucosa was pink and moist.  Dentition was in good repair.  Uvula was midline, moved symmetrically with phonation.  Patient was able to swallow without any difficulty.  NECK:  Supple, no palpable lymphadenopathy.  Thyroid gland was nontender.  The patient had good range of motion of her cervical spine without any difficulty or tenderness.  CHEST:  Large from AP diameter and lateral diameter.  Lungs clear and equal bilaterally.  No wheezes, rales, rhonchi, or rubs noted.  HEART:  Regular rate and rhythm of S1 and S2 was auscultated.  No murmurs, rubs, or gallops noted.  ABDOMEN:  Obese, unable to palpate any hepatosplenomegaly.  It was soft, she had no tenderness with deep palpation in any quadrants.  There were  normal active bowel sounds throughout.  CVA was nontender to percussion.  EXTREMITIES:  Upper extremities:  Symmetrically sized and shaped with  excellent range of motion of her elbows, shoulder, and wrists.  She had 5/5 motor strength in all muscle groups tested.  Lower extremities:  Patient had full extension and flexion to approximately 90 degrees in the right and left hip with about 10-20 degrees internal-external rotation with no tenderness in either hip.  Patients lower extremities were extremely obese in the upper thighs which significantly limited range of motion of her knees and her hips.  Bilateral knees were symmetrically sized and shaped with no sign of erythema or ecchymosis.  They were rather large and obese and boggy appearing.  She had no palpable effusion.  She did have tenderness along the medial joint lines bilaterally.  She did have significant valgus-varus laxity with stressing, which was about 10 degrees on the left and approximately 15 degrees on the right.  The left leg had approximately a 10 degree valgus stress with no weight on it but would increase to approximately  20 degrees with weight bearing.  Right leg had a 20 degree valgus deformity without any weightbearing.  Bilateral calves were nontender.  She had trace lower extremity edema bilaterally.  Bilateral ankles were symmetrical with good dorsi and plantar flexion.  PERIPHERAL VASCULATURE:  Carotid pulses were 2+, no bruits.  Radial pulses were 1+ bilaterally.  Unable to palpate femoral pulses.  Dorsalis pedis pulses were 1+ bilaterally.  No lower extremity varicosities or venous stasis changes noted in the lower extremities.  NEUROLOGIC:  Patient was conscious, alert, and appropriate, held an easy conversation with the examiner.  Cranial nerves 2-12 were grossly intact. Deep tendon reflexes of the upper and lower extremities were grossly intact.  BREAST/RECTAL/GENITOURINARY:  Deferred at this  time.  IMPRESSION: 1. Significant osteoarthritis bilateral knees, left being greater than right. 2. Hypertension. 3. Obesity. 4. Asthma. 5. Chronic pain.  PLAN:  Patient will be admitted to [facility] on June 14, 2000. Patient will undergo all routine labs and tests prior to undergoing a left total knee arthroplasty by Dr. Chaney Malling.  The patient has donated 2 units of autologous blood. DD:  06/06/00 TD:  06/07/00 Job: [id number]

## 2010-08-07 NOTE — Op Note (Signed)
[place]  Patient:    [name], [name]                    MRN: [id number] Proc. Date: 06/14/00 Adm. Date:  [id number] Attending:  Cornell Barman                           Operative Report  PREOPERATIVE DIAGNOSIS:  Severe osteoarthritis, left knee.  POSTOPERATIVE DIAGNOSIS:  Severe osteoarthritis, left knee.  OPERATION PERFORMED:  Total knee replacement on the left.  All components were glued in.  A cemented standard size left femoral component with a #3 cemented tibial keel tray with a 12.5 mm LCS standard size insert and a three-peg cemented patella rotating standard size.  SURGEON:  Lenard Galloway. Chaney Malling, M.D.  ASSISTANT:  Jamelle Rushing, P.A.  ANESTHESIA:  General.  DESCRIPTION OF PROCEDURE:  The patient was placed on the operating table in the supine position.  After satisfactory general anesthesia, a pneumatic tourniquet was placed about the left upper thigh.  The entire left lower extremity was prepped with DuraPrep and draped out in the usual manner.  The leg was then wrapped out with and Esmarch and the tourniquet was elevated.  A long incision was made starting at about the area of the tibial tubercle adn carried proximally to the patella.  Skin edges were retracted.  Bleeders were coagulated.  A great deal of time and care was taken to coagulate all the bleeders.  There was a large amount of adipose tissue over the anterior aspect of the knee.  A long median parapatellar incision was then made with a Bovie. The patella was everted.  The knee was flexed to 90 degrees.  Both medial and lateral meniscus were excised.  At this point fairly good exposure of the proximal tibia was achieved.  Tibial guide #1 was placed over the proximal end of the tibia and the cutting block was placed at what was felt to be the appropriate level.  This was checked with the external guide with the tibial tower.  A good alignment appeared to be  achieved.  A cutting block was then fixed with pins.  Using capture guide, the proximal end of the tibia was amputated.  The cut section was removed and excellent flush cut was achieved. At this point attention was then turned to the distal end of the femur.  A notch was made over the anterior aspect of the femur.  A standard sized femoral block #1 was placed over the anterior aspect of the femur and the drill hole was placed in the femur.  The intramedullary rod was inserted. With the C-clamp and the knee flexed 90 degrees, with part of the C-clamp on the cut surface of the tibia, the rotation of the distal femur centered correctly.  This was then fixed with fixation pins.  Anterior and posterior cuts were made over the distal end of the femur.  A spacer block was then placed with the knee flexed and a 12.5 mm spacer block showed both the collateral ligaments were balanced beautifully.  Cutting block #2 was placed over the anterior aspect of the femur with intramedullary rod.  This was held flush with the cut surface of the femur and fixed in place with fixation pins. With the knee extended, a 12.5 mm cutting block set up the collateral ligaments very nicely.  The distal end of the femur was then  amputated.  With the spacer block inserted, there was excellent stability of the collateral ligaments with varus and valgus stressing.  There was full extension.  The final chamfer guide was placed over the distal end of the femur.  This was locked in place with fixation pins.  The chamfer cuts were made and drill holes were made.  Block #3 was removed and debris was removed from the popliteal area.  Bone spurs were removed.  At this point the proximal tibia was subluxed anteriorly using ____________ retractor.  A size 3 tibial tray seemed to fit very nicely.  This was pinned in position.  A drill hole was placed for the tibial keel.  The wing of the keel was then passed down the guide in the  tibial tray.  The tray was then locked in position.  A 12 mm trial platform was inserted and the trial femoral component was placed over the distal end of the femur.  The knee was then articulated and put through a full range of motion.  This had excellent stability and excellent alignment, full flexion, full extension and excellent balancing of collateral ligaments. At this point attention was turned to the posterior aspect of the patella which was everted.  A lateral release was done.  The cutting guide was placed over the posterior aspect of the patella and this was amputated.  Using a 3-peg guide, drill holes were placed over the posterior aspect of the patella and a trial patella was inserted.  The knee was then put through a full range of motion.  Patella tracked very nicely after a lateral release.  There was no tilt of the patella.  All the components fit very nicely.  At this point all of the components were removed.  Using pulsating lavage, all debris was removed. Blood was evacuated from the knee.  Antibiotic solution was used to flush the knee out multiple times throughout the procedure.  Glue was mixed. All the components were then inserted sequentially starting with the tibia. The tibial tray was inserted first.  Excess glue was removed.  The poly spacer was inserted and glue was placed over the distal end of the femur and posterior runners of the femoral component.  The femoral component was then articulated and driven home with impactor.  Exess glue was removed.  The knee was articulated and ____________  upon the Mayo stand.  Glue was placed in the  posterior aspect of the patella and the patella was inserted in the drill holes.  This was held in place with a patellar clamp.  As the glue was hardening, all excess glue was removed.  Once the glue was hardened, the knee was put through a full range of motion and the knee was extremely stable.  The tibial poly trial was then  removed and the tourniquet was dropped.  All bleeders were coagulated.  Good hemostasis was achieved.  The final 12.5 mm poly insert was inserted with a tibial tray and articulated.  The knee was put  through a full range of motion again and wonderful stability in full flexion and extension was achieved.  Hemovac drain was inserted.  The long median parapatellar incision was closed with interrupted heavy Tycron sutures. Vicryl was used to close the subcutaneous tissues and stainless steel staples were used to close the skin.  Sterile dressings were applied and the patient returned to recovery room in excellent condition.  Technically, this went extremely well.  DRAINS:  Hemovac.  COMPLICATIONS:  None. DD:  06/14/00 TD:  06/14/00 Job: [id number]

## 2010-08-07 NOTE — Op Note (Signed)
NAMEERIONA, KINCHEN             ACCOUNT NO.:  [id number]   MEDICAL RECORD NO.:  [id number]          PATIENT TYPE:  AMB   LOCATION:  SDS                          FACILITY:  [facility]   PHYSICIAN:  Artist Pais. Weingold, M.D.DATE OF BIRTH:  July 13, 1945   DATE OF PROCEDURE:  03/31/2005  DATE OF DISCHARGE:  03/31/2005                                 OPERATIVE REPORT   PREOPERATIVE DIAGNOSIS:  Right thumb carpometacarpal joint arthritis.   POSTOPERATIVE DIAGNOSIS:  Right thumb carpometacarpal joint arthritis.   OPERATION PERFORMED:  Right thumb carpometacarpal joint arthroplasty with  Artelon spacer.   SURGEON:  Artist Pais. Mina Marble, M.D.   ASSISTANT:  None.   ANESTHESIA:  General.   TOURNIQUET TIME:  40 minutes.   COMPLICATIONS:  None.   DRAINS:  None.   DESCRIPTION OF PROCEDURE:  The patient was taken to the operating room.  After the induction of adequate general anesthesia, the right upper  extremity was prepped and draped in the usual sterile fashion.  Esmarch was  used to exsanguinate the limb.  The tourniquet was inflated to 275 mmHg.  At  that point a longitudinal incision was made over the [facility] joint of the right  thumb for a distance of 3 to 4 cm.  Skin was incised.  Dissection was  carried down to the [facility] capsule.  The snuffbox artery was carefully  identified and retracted.  A CMC capsulotomy was performed and a large  proximally based flap was elevated.  The dorsal cortical aspects of the base  of the thumb metacarpal and trapezium were decorticated down to cancellous  bone and a small trough was made in both bones.  At this point using a small  oscillating saw, the articular surface of the trapezium was removed as well  as large osteophytes both on the radial and ulnar sides.  The wound was then  thoroughly irrigated to remove all loose bone chips.  At this point the  Artelon spacer was placed through the thumb metacarpal using 18 mm 2.0 screw  and a 14 mm 2.0 screw in  the trapezium.  Intraoperative fluoroscopy revealed  adequate placement of the screw and all hardware.  The wound was then  thoroughly irrigated.  The capsule was repaired with 4-0 Tycron and the skin  with 3-0 Prolene subcuticular stitch.  Steri-Strips, 4 x 4s, fluffs and a  radial gutter splint was applied.  The patient tolerated the procedure well  and went to recovery in stable fashion.     Artist Pais Mina Marble, M.D.  Electronically Signed    MAW/MEDQ  D:  03/31/2005  T:  03/31/2005  Job:  [id number]

## 2011-03-23 DIAGNOSIS — J383 Other diseases of vocal cords: Secondary | ICD-10-CM

## 2011-03-23 HISTORY — DX: Other diseases of vocal cords: J38.3

## 2011-03-23 HISTORY — PX: CARDIAC CATHETERIZATION: SHX172

## 2011-05-31 DIAGNOSIS — M19079 Primary osteoarthritis, unspecified ankle and foot: Secondary | ICD-10-CM | POA: Insufficient documentation

## 2011-07-01 ENCOUNTER — Other Ambulatory Visit: Payer: Self-pay | Admitting: Orthopedic Surgery

## 2011-07-01 ENCOUNTER — Ambulatory Visit
Admission: RE | Admit: 2011-07-01 | Discharge: 2011-07-01 | Disposition: A | Discharge status: Home / Self Care | Payer: BC Managed Care – PPO | Source: Ambulatory Visit | Attending: Orthopedic Surgery | Admitting: Orthopedic Surgery

## 2011-07-01 DIAGNOSIS — R52 Pain, unspecified: Secondary | ICD-10-CM

## 2011-07-04 ENCOUNTER — Ambulatory Visit
Admission: RE | Admit: 2011-07-04 | Discharge: 2011-07-04 | Disposition: A | Discharge status: Home / Self Care | Payer: BC Managed Care – PPO | Source: Ambulatory Visit | Attending: Orthopedic Surgery | Admitting: Orthopedic Surgery

## 2011-07-04 DIAGNOSIS — R52 Pain, unspecified: Secondary | ICD-10-CM

## 2011-08-06 DIAGNOSIS — M25559 Pain in unspecified hip: Secondary | ICD-10-CM | POA: Diagnosis not present

## 2011-08-09 DIAGNOSIS — M7060 Trochanteric bursitis, unspecified hip: Secondary | ICD-10-CM | POA: Insufficient documentation

## 2011-08-17 DIAGNOSIS — J45901 Unspecified asthma with (acute) exacerbation: Secondary | ICD-10-CM | POA: Diagnosis not present

## 2011-08-27 ENCOUNTER — Ambulatory Visit
Admission: RE | Admit: 2011-08-27 | Discharge: 2011-08-27 | Disposition: A | Discharge status: Home / Self Care | Payer: Medicare Other | Source: Ambulatory Visit | Attending: Allergy and Immunology | Admitting: Allergy and Immunology

## 2011-08-27 ENCOUNTER — Other Ambulatory Visit: Payer: Self-pay | Admitting: Allergy and Immunology

## 2011-08-27 DIAGNOSIS — R0602 Shortness of breath: Secondary | ICD-10-CM | POA: Diagnosis not present

## 2011-08-27 DIAGNOSIS — J45909 Unspecified asthma, uncomplicated: Secondary | ICD-10-CM | POA: Diagnosis not present

## 2011-08-27 DIAGNOSIS — J309 Allergic rhinitis, unspecified: Secondary | ICD-10-CM | POA: Diagnosis not present

## 2011-08-31 DIAGNOSIS — J45909 Unspecified asthma, uncomplicated: Secondary | ICD-10-CM | POA: Diagnosis not present

## 2011-08-31 DIAGNOSIS — J309 Allergic rhinitis, unspecified: Secondary | ICD-10-CM | POA: Diagnosis not present

## 2011-09-07 ENCOUNTER — Emergency Department (HOSPITAL_COMMUNITY)
Admission: EM | Admit: 2011-09-07 | Discharge: 2011-09-07 | Disposition: A | Discharge status: Home / Self Care | Payer: Medicare Other | Attending: Emergency Medicine | Admitting: Emergency Medicine

## 2011-09-07 ENCOUNTER — Encounter (HOSPITAL_COMMUNITY): Payer: Self-pay | Admitting: Emergency Medicine

## 2011-09-07 ENCOUNTER — Emergency Department (HOSPITAL_COMMUNITY): Payer: Medicare Other

## 2011-09-07 DIAGNOSIS — R079 Chest pain, unspecified: Secondary | ICD-10-CM | POA: Diagnosis not present

## 2011-09-07 DIAGNOSIS — M129 Arthropathy, unspecified: Secondary | ICD-10-CM | POA: Insufficient documentation

## 2011-09-07 DIAGNOSIS — R209 Unspecified disturbances of skin sensation: Secondary | ICD-10-CM | POA: Insufficient documentation

## 2011-09-07 DIAGNOSIS — Z7982 Long term (current) use of aspirin: Secondary | ICD-10-CM | POA: Insufficient documentation

## 2011-09-07 DIAGNOSIS — R61 Generalized hyperhidrosis: Secondary | ICD-10-CM | POA: Diagnosis not present

## 2011-09-07 DIAGNOSIS — J45909 Unspecified asthma, uncomplicated: Secondary | ICD-10-CM | POA: Insufficient documentation

## 2011-09-07 DIAGNOSIS — I10 Essential (primary) hypertension: Secondary | ICD-10-CM | POA: Insufficient documentation

## 2011-09-07 DIAGNOSIS — E7800 Pure hypercholesterolemia, unspecified: Secondary | ICD-10-CM | POA: Insufficient documentation

## 2011-09-07 DIAGNOSIS — F411 Generalized anxiety disorder: Secondary | ICD-10-CM | POA: Insufficient documentation

## 2011-09-07 DIAGNOSIS — R0602 Shortness of breath: Secondary | ICD-10-CM

## 2011-09-07 HISTORY — DX: Unspecified osteoarthritis, unspecified site: M19.90

## 2011-09-07 HISTORY — DX: Essential (primary) hypertension: I10

## 2011-09-07 HISTORY — DX: Unspecified asthma, uncomplicated: J45.909

## 2011-09-07 LAB — POCT I-STAT TROPONIN I
Troponin i, poc: 0 ng/mL (ref 0.00–0.08)
Troponin i, poc: 0 ng/mL (ref 0.00–0.08)

## 2011-09-07 LAB — DIFFERENTIAL
Basophils Absolute: 0 10*3/uL (ref 0.0–0.1)
Basophils Relative: 0 % (ref 0–1)
Eosinophils Absolute: 0.1 10*3/uL (ref 0.0–0.7)
Monocytes Relative: 8 % (ref 3–12)
Neutrophils Relative %: 59 % (ref 43–77)

## 2011-09-07 LAB — CBC
MCH: 25.5 pg — ABNORMAL LOW (ref 26.0–34.0)
MCHC: 31.8 g/dL (ref 30.0–36.0)
Platelets: 158 10*3/uL (ref 150–400)
RDW: 16.7 % — ABNORMAL HIGH (ref 11.5–15.5)

## 2011-09-07 LAB — BASIC METABOLIC PANEL
BUN: 19 mg/dL (ref 6–23)
Calcium: 9.1 mg/dL (ref 8.4–10.5)
Creatinine, Ser: 0.82 mg/dL (ref 0.50–1.10)
GFR calc Af Amer: 85 mL/min — ABNORMAL LOW (ref >90)
GFR calc non Af Amer: 73 mL/min — ABNORMAL LOW (ref >90)
Glucose, Bld: 110 mg/dL — ABNORMAL HIGH (ref 70–99)

## 2011-09-07 MED ORDER — NITROGLYCERIN 0.4 MG SL SUBL: 0.4000 mg | SUBLINGUAL_TABLET | SUBLINGUAL | Status: DC | PRN

## 2011-09-07 NOTE — ED Notes (Signed)
Pt woke up right at 0200 with tingling and pain in right breast, arm, back tingling. Been struggling with SOB for several weeks; aspirin taken at the house before coming.

## 2011-09-07 NOTE — Discharge Instructions (Signed)
Please read and follow all provided instructions.  Your diagnoses today include:  1. Shortness of breath     Tests performed today include:  An EKG of your heart  A chest x-ray  Cardiac enzymes - a blood test for heart muscle damage  Blood counts and electrolytes  Vital signs. See below for your results today.   Medications prescribed:   Nitroglycerin - take if you have chest pain  Take any prescribed medications only as directed.  Follow-up instructions: Please follow-up with your primary care provider next Monday as planned for further evaluation of your symptoms.  If you have chest pain, take nitroglycerin. If your pain is not improved after one nitroglycerin, call 9-1-1.    Return instructions:  SEEK IMMEDIATE MEDICAL ATTENTION IF:  You have severe chest pain, especially if the pain is crushing or pressure-like and spreads to the arms, back, neck, or jaw, or if you have sweating, nausea (feeling sick to your stomach), or shortness of breath. THIS IS AN EMERGENCY. Don't wait to see if the pain will go away. Get medical help at once. Call 911 or 0 (operator). DO NOT drive yourself to the hospital.   Your chest pain gets worse and does not go away with rest.   You have an attack of chest pain lasting longer than usual, despite rest and treatment with the medications your caregiver has prescribed.   You wake from sleep with chest pain or shortness of breath.  You feel dizzy or faint.  You have chest pain not typical of your usual pain for which you originally saw your caregiver.   You have any other emergent concerns regarding your health.  Additional Information: Chest pain comes from many different causes. Your caregiver has diagnosed you as having chest pain that is not specific for one problem, but does not require admission.  You are at low risk for an acute heart condition or other serious illness.   Your vital signs today were: BP 152/79  Pulse 98  Temp  98.3 F (36.8 C) (Oral)  Resp 18  Ht 5' 3.5" (1.613 m)  Wt 250 lb (113.399 kg)  BMI 43.59 kg/m2  SpO2 100% If your blood pressure (BP) was elevated above 135/85 this visit, please have this repeated by your doctor within one month. -------------- No Primary Care Doctor Call [facility]  [phone] Other agencies that provide inexpensive medical care    [facility]  [phone]    [facility]  [phone]    [facility]  [phone]    [facility]  [phone]    [facility]  [phone]    [facility]  [phone] -------------- RESOURCE GUIDE:  Dental Problems  Patients with Medicaid: [facility] [phone]                                            [phone] [address] [employer] [address] Phone:  [phone]  Phone:  [phone]  If unable to pay or uninsured, contact:  [facility] or [facility] to become qualified for the adult dental clinic.  Chronic Pain Problems Contact [facility]  [phone] Patients need to be referred by their primary care doctor.  Insufficient Money for Medicine Contact [employer]:  call "211" or [facility] [phone].  Psychological Services [facility]  [phone] [facility]  [phone] [facility]   [phone] (emergency services [phone])  Substance Abuse Resources [facility]  [phone] [facility] [phone] [facility] [place] [phone] Floydene Flock [phone] Residential & Outpatient Substance Abuse Program  [phone]  Abuse/Neglect [facility] Child Abuse Hotline 3321180794 [facility] Child Abuse Hotline [phone] (After Hours)  Emergency Shelter [facility] [phone]  Maternity Homes Room at the [place] [facility] [phone] [place] [facility] [phone]  [facility] Resources  [facility]     [employer]                          [facility] Dept. [address]                        [address]      [address]  Blondell Reveal Phone:  [phone]                                   Phone:  [phone]                 Phone:  [phone]  [facility] Phone:  [phone]  [facility] Child Abuse Hotline 8652183237 [phone] (After Hours)

## 2011-09-07 NOTE — ED Notes (Addendum)
First meeting with patient. Patient state she continues to feel SOB and is waiting for test results.

## 2011-09-07 NOTE — ED Notes (Signed)
Pain free at this time.  SR on monitor.  Given po liquids per PA Geiple

## 2011-09-07 NOTE — ED Provider Notes (Addendum)
Medical screening examination/treatment/procedure(s) were conducted as a shared visit with non-physician practitioner(s) and myself.  I personally evaluated the patient during the encounter Pt c/o intermittent cp with dyspnea, nausea and sweating when she ambulates.  Sxs do not occur all the time with ambulation. When sxs do occur, they resolve with rest.  She is asx now.  pe normal except obese.  Trop neg X2.  Will discuss with pcp  Cheri Guppy, MD 09/07/11 [phone]  Spoke with dr. Clelia Croft.   He will arrange eval by cardiologist.  He also wants pt to keep her appt. With him next Monday.  I advised pt to take asa daily.  We will give ntg.  If her sxs recur, she is to take 1 ntg, if sxs persist, she is to call 911 and come to ed. Pt and husband both understand and agree with plan.  Cheri Guppy, MD 09/07/11 [phone]

## 2011-09-07 NOTE — ED Provider Notes (Signed)
History     CSN: [id number]  Arrival date & time 09/07/11  4098   First MD Initiated Contact with Patient 09/07/11 [phone]      Chief Complaint  Patient presents with  . Chest Pain    (Consider location/radiation/quality/duration/timing/severity/associated sxs/prior treatment) HPI Comments: Patient with history of hypertension, high cholesterol, asthma, no prior cardiac history -- presents with complaint of right chest pain with radiation to right arm and back that awoke her from sleep at approximately 2 AM. This was associated with shortness of breath, sweating. The symptoms lasted for approximately 30 minutes. Patient took aspirin prior to coming to the hospital. Patient states that she has had several episodes of shortness of breath over the past several months that are intermittent, worsened with activity, and resolved with rest. She's not had chest pain with these symptoms prior. Patient is scheduled an appointment with her primary care physician in 6 days for further evaluation of the symptoms. Patient denies fever, cough, swelling of her legs.   The history is provided by the patient.    Past Medical History  Diagnosis Date  . Asthma   . Arthritis   . Hypertension   . Hypercholesteremia     No past surgical history on file.  No family history on file.  History  Substance Use Topics  . Smoking status: Not on file  . Smokeless tobacco: Not on file  . Alcohol Use:     OB History    Grav Para Term Preterm Abortions TAB SAB Ect Mult Living                  Review of Systems  Constitutional: Positive for diaphoresis. Negative for fever.  HENT: Negative for neck pain.   Eyes: Negative for redness.  Respiratory: Positive for shortness of breath. Negative for cough.   Cardiovascular: Positive for chest pain. Negative for palpitations and leg swelling.  Gastrointestinal: Negative for nausea, vomiting and abdominal pain.  Genitourinary: Negative for dysuria.    Musculoskeletal: Negative for back pain.  Skin: Negative for rash.  Neurological: Negative for syncope and light-headedness.  Psychiatric/Behavioral: The patient is nervous/anxious.     Allergies  Review of patient's allergies indicates no known allergies.  Home Medications   Current Outpatient Rx  Name Route Sig Dispense Refill  . ALBUTEROL SULFATE HFA 108 (90 BASE) MCG/ACT IN AERS Inhalation Inhale 2 puffs into the lungs 3 (three) times daily.    . ASPIRIN 325 MG PO TABS Oral Take 325 mg by mouth once.    . BECLOMETHASONE DIPROPIONATE 80 MCG/ACT IN AERS Inhalation Inhale 1 puff into the lungs 2 (two) times daily.    . CELECOXIB 200 MG PO CAPS Oral Take 200 mg by mouth daily.    [name] CETIRIZINE HCL 10 MG PO TABS Oral Take 10 mg by mouth daily.    . DULOXETINE HCL 60 MG PO CPEP Oral Take 60 mg by mouth daily.    [name] LOSARTAN POTASSIUM 50 MG PO TABS Oral Take 50 mg by mouth daily.    . MOMETASONE FUROATE 50 MCG/ACT NA SUSP Nasal Place 2 sprays into the nose 2 (two) times daily.    [name] OMEPRAZOLE 20 MG PO CPDR Oral Take 20 mg by mouth 2 (two) times daily.    [name] RANITIDINE HCL 300 MG PO TABS Oral Take 300 mg by mouth at bedtime.    [name] SIMVASTATIN 20 MG PO TABS Oral Take 20 mg by mouth every evening.  BP 134/86  Pulse 95  Temp 98.3 F (36.8 C) (Oral)  Resp 15  Ht 5' 3.5" (1.613 m)  Wt 250 lb (113.399 kg)  BMI 43.59 kg/m2  SpO2 98%  Physical Exam  Nursing note and vitals reviewed. Constitutional: She appears well-developed and well-nourished.  HENT:  Head: Normocephalic and atraumatic.  Mouth/Throat: Mucous membranes are normal. Mucous membranes are not dry.  Eyes: Conjunctivae are normal.  Neck: Trachea normal and normal range of motion. Neck supple. Normal carotid pulses and no JVD present. No muscular tenderness present. Carotid bruit is not present. No tracheal deviation present.  Cardiovascular: Normal rate, regular rhythm, S1 normal, S2 normal, normal heart sounds and intact  distal pulses.  Exam reveals no decreased pulses.   No murmur heard. Pulmonary/Chest: Effort normal and breath sounds normal. No respiratory distress. She has no wheezes. She exhibits no tenderness.  Abdominal: Soft. Normal aorta and bowel sounds are normal. There is no tenderness. There is no rebound and no guarding.  Musculoskeletal: Normal range of motion.  Neurological: She is alert.  Skin: Skin is warm and dry. She is not diaphoretic. No cyanosis. No pallor.  Psychiatric: She has a normal mood and affect.    ED Course  Procedures (including critical care time)  Labs Reviewed  BASIC METABOLIC PANEL - Abnormal; Notable for the following:    Glucose, Bld 110 (*)     GFR calc non Af Amer 73 (*)     GFR calc Af Amer 85 (*)     All other components within normal limits  CBC - Abnormal; Notable for the following:    Hemoglobin 11.4 (*)     HCT 35.8 (*)     MCH 25.5 (*)     RDW 16.7 (*)     All other components within normal limits  DIFFERENTIAL   Dg Chest 2 View  09/07/2011  *RADIOLOGY REPORT*  Clinical Data: Right-sided chest pain and right hand numbness. Shortness of breath.  CHEST - 2 VIEW  Comparison: Chest radiograph performed 08/27/2011  Findings: The lungs are well-aerated and clear.  There is no evidence of focal opacification, pleural effusion or pneumothorax.  The heart is normal in size; the mediastinal contour is within normal limits.  No acute osseous abnormalities are seen; subcortical cystic change is noted at the right humeral head. Clips are noted within the right upper quadrant, reflecting prior cholecystectomy.  IMPRESSION: No acute cardiopulmonary process seen.  Original Report Authenticated By: Tonia Ghent, M.D.     1. Shortness of breath     6:32 AM Patient seen and examined. Results reviewed. Two troponins are negative.   Vital signs reviewed and are as follows: Filed Vitals:   09/07/11 0402  BP: 134/86  Pulse: 95  Temp: 98.3 F (36.8 C)  Resp: 15     Date: 09/07/2011  Rate: 101  Rhythm: sinus tachycardia  QRS Axis: normal  Intervals: normal  ST/T Wave abnormalities: normal  Conduction Disutrbances:none  Narrative Interpretation:   Old EKG Reviewed: changes noted from 11/01/10, poor r progression resolved.    Monitor strip review shows NSR with frequent PACs. Monitor was reading rhythm as irregular, however this was not afib.   8:12 AM Patient was discussed with Dr. Weldon Inches who has seen patient. He will speak with patient's PCP to arrange follow-up.   Dr. Weldon Inches has spoken with Dr. Clelia Croft who will arrange cardiology eval and see in office next week.  Patient d/c to home with rx for nitro. Instructed  to take one with CP. If no relief, call [id number]. She verbalizes understanding.   Patient was counseled to return with severe chest pain, especially if the pain is crushing or pressure-like and spreads to the arms, back, neck, or jaw, or if they have sweating, nausea, or shortness of breath with the pain. They were encouraged to call 911 with these symptoms.   They were also told to return if their chest pain gets worse and does not go away with rest, they have an attack of chest pain lasting longer than usual despite rest and treatment with the medications their caregiver has prescribed, if they wake from sleep with chest pain or shortness of breath, if they feel dizzy or faint, if they have chest pain not typical of their usual pain, or if they have any other emergent concerns regarding their health.  The patient verbalized understanding and agreed.    MDM  CP -- work-up neg here however she will need further evaluation to delineate etiology. Patient has good PCP follow-up. Contact with PCP made. They will arrange for cardiology eval and follow in office. Patient is comfortable with plan and is pain free. Strict return instructions given.         Renne Crigler, [place] 09/07/11 1924

## 2011-09-08 DIAGNOSIS — R0602 Shortness of breath: Secondary | ICD-10-CM | POA: Diagnosis not present

## 2011-09-08 DIAGNOSIS — R079 Chest pain, unspecified: Secondary | ICD-10-CM | POA: Diagnosis not present

## 2011-09-08 NOTE — ED Provider Notes (Signed)
Medical screening examination/treatment/procedure(s) were conducted as a shared visit with non-physician practitioner(s) and myself.  I personally evaluated the patient during the encounter  Kevonna Nolte, MD 09/08/11 1538 

## 2011-09-09 ENCOUNTER — Other Ambulatory Visit: Payer: Self-pay | Admitting: Cardiovascular Disease

## 2011-09-09 ENCOUNTER — Encounter (HOSPITAL_COMMUNITY): Payer: Self-pay | Admitting: Cardiology

## 2011-09-09 ENCOUNTER — Encounter (HOSPITAL_COMMUNITY): Payer: Self-pay | Admitting: Pharmacy Technician

## 2011-09-09 DIAGNOSIS — Z96669 Presence of unspecified artificial ankle joint: Secondary | ICD-10-CM | POA: Diagnosis not present

## 2011-09-09 DIAGNOSIS — R0602 Shortness of breath: Secondary | ICD-10-CM | POA: Diagnosis not present

## 2011-09-09 DIAGNOSIS — K219 Gastro-esophageal reflux disease without esophagitis: Secondary | ICD-10-CM | POA: Diagnosis present

## 2011-09-09 DIAGNOSIS — M25579 Pain in unspecified ankle and joints of unspecified foot: Secondary | ICD-10-CM | POA: Diagnosis not present

## 2011-09-09 DIAGNOSIS — E785 Hyperlipidemia, unspecified: Secondary | ICD-10-CM | POA: Diagnosis present

## 2011-09-09 DIAGNOSIS — I200 Unstable angina: Secondary | ICD-10-CM

## 2011-09-09 DIAGNOSIS — I10 Essential (primary) hypertension: Secondary | ICD-10-CM

## 2011-09-09 DIAGNOSIS — R079 Chest pain, unspecified: Secondary | ICD-10-CM | POA: Diagnosis not present

## 2011-09-09 DIAGNOSIS — M19079 Primary osteoarthritis, unspecified ankle and foot: Secondary | ICD-10-CM | POA: Diagnosis not present

## 2011-09-09 HISTORY — DX: Essential (primary) hypertension: I10

## 2011-09-09 HISTORY — DX: Hyperlipidemia, unspecified: E78.5

## 2011-09-09 HISTORY — PX: TRANSTHORACIC ECHOCARDIOGRAM: SHX275

## 2011-09-09 HISTORY — DX: Unstable angina: I20.0

## 2011-09-09 HISTORY — DX: Gastro-esophageal reflux disease without esophagitis: K21.9

## 2011-09-09 NOTE — H&P (Addendum)
[name] is an 66 y.o. female.    OFFICE NOTE FROM 09/08/11  PCP: Dr. Clelia Croft  Chief Complaint: chest pain HPI: 66 year old white, married, female with no prior history of CAD presents to the office at referral of Dr. Clelia Croft secondary to ER visit early AM after awakening from sleep with chest pain with radiation from rt. Chest to rt. scapula  Associated with SOB, Nausea and diaphoresis.  The severity of the pain was resolving enroute to ER and it was resolved in the ER.  Her cardiac markers were negative, chest x-ray WNL.  She was discharged and followed up in the office today.  Currently occ. Mild chest tightness.    She also stated she has been having increased SOB with exertion over the last 2 months increasing more in the last 2 weeks.   This is frequently associated with chest tightness.  Resolves with rest.   No nausea until last pm.  She does have recent history of PNA that has been treated and normal CXR in ER.  She also has asthma that is aggravated by her reflux.  She also complains of runs, short runs, of fast heart beats.   Here in the office HR is 120 at rest.  In the ER it was 102  SR.  EKG ST no acute ST changes.  Past Medical History  Diagnosis Date  . Asthma   . Arthritis   . Hypertension   . Hypercholesteremia   . Unstable angina 09/09/2011  . HTN (hypertension) 09/09/2011  . Hyperlipemia 09/09/2011  . GERD (gastroesophageal reflux disease) 09/09/2011  . Anxiety     Past Surgical History  Procedure Date  . Back surgery [phone]    3 ruptured discs  . Joint replacement lt. knee 2002, rt. knee 05/2010, rt. ankle replacement 03/12/11,    . Cholecystectomy   . Abdominal hysterectomy   . Tumor excision 1964    rt. leg fatty tumor  . Shoulder surgery 198    rt. shoulder  . Wrist surgery 1997, 2009     Family History  Problem Relation Age of Onset  . Cancer Mother   . Cancer Father   . Coronary artery disease Neg Hx    Social History:  reports that  she has been passively smoking.  She has never used smokeless tobacco. She reports that she does not drink alcohol or use illicit drugs. She is married ,2 children and 4 GC.    Allergies: No Known Allergies  OUTPATIENT MEDICATIONS:  Losartan 50 mg. Daily Simvastatin 20 mg. Daily Omeprazole 20 mg BID Ranitidine 300 mg. Daily Nasonex spray BID QVAR 80 mg. BID Cymbalta 60 mg daily Celebrex 200 mg daily NTG 0.4 mg sl prn Albuterol inhaler prn Zyrtec 10 mg daily    No results found for this or any previous visit (from the past 48 hour(s)). No results found.  ROS: General:no colds of fevers Skin:no rashes or ulcers HEENT:no blurred vision, wears glasses CV:see HPI PUL:see HPI, but no wheezes this week GI:no diarrhea no constipation, no melena GU:no hematuria, no dysuria MW:[id number] pain in most joints Neuro:no syncope, no lightheadedness except with pain prior to ER visit she thought she would pass out. Endo: no DM no thyroid disease   BP 126/80 lt  Rt arm 124/90  Standing 112/90  Wt 260.4  Ht 5'3.5"  P 121 PE: General:alert and oriented X3, pleasant affect, husband is with her for visit. Skin:w&d, brisk capillary refill HEENT:Glasses in place, sclera  clear Neck:supple, no JVD, no bruits, 2+ carotid upstroke Heart:S1S2 RRR rapid heart rate, no obvious murmur or gallup Lungs:no rales or wheezes, clear lungs bil. ZOX:WRUEAV, soft non tender + BS Ext:no edema, 2 + pedal pulses bil. 2 + radial pulses bil.  Neuro:alert and oriented X 3 MAE, follows commands,     Assessment/Plan Patient Active Problem List  Diagnosis  . Unstable angina  . HTN (hypertension)  . Hyperlipemia  . GERD (gastroesophageal reflux disease)   PLAN:Dr. Little saw and examined the patient.  Concern for CAD with unstable angina with chest pain, SOB, diaphoresis, and nausea.  Additionally she is having increased episodes of SOB with exertion and chest tightness.  Also inappropriate sinus tach.  Dr. Clarene Duke  discussed cardiac cath most likely rt. Radial procedure to evaluate her coronary arteries.  She has NTG if needed for chest pain.  We will also obtain 2D Echo prior to cath. Pt. And her husband were agreeable to proceed.  [id number],BJYNW R 09/09/2011, 2:37 PM    Agree with note written by Nada Boozer RNP  Pt with + CRF, recent ER visit for ROMI with neg enz, and increasing SOB presents today for OP cath.  Runell Gess 09/10/2011 2:41 PM

## 2011-09-10 ENCOUNTER — Ambulatory Visit (HOSPITAL_COMMUNITY)
Admission: RE | Admit: 2011-09-10 | Discharge: 2011-09-10 | Disposition: A | Discharge status: Home / Self Care | Payer: Medicare Other | Source: Ambulatory Visit | Attending: Cardiovascular Disease | Admitting: Cardiovascular Disease

## 2011-09-10 ENCOUNTER — Encounter (HOSPITAL_COMMUNITY)
Admission: RE | Disposition: A | Discharge status: Home / Self Care | Payer: Self-pay | Source: Ambulatory Visit | Attending: Cardiovascular Disease

## 2011-09-10 DIAGNOSIS — E785 Hyperlipidemia, unspecified: Secondary | ICD-10-CM | POA: Diagnosis not present

## 2011-09-10 DIAGNOSIS — R079 Chest pain, unspecified: Secondary | ICD-10-CM | POA: Insufficient documentation

## 2011-09-10 DIAGNOSIS — J45909 Unspecified asthma, uncomplicated: Secondary | ICD-10-CM | POA: Diagnosis not present

## 2011-09-10 DIAGNOSIS — K219 Gastro-esophageal reflux disease without esophagitis: Secondary | ICD-10-CM | POA: Insufficient documentation

## 2011-09-10 DIAGNOSIS — I2510 Atherosclerotic heart disease of native coronary artery without angina pectoris: Secondary | ICD-10-CM | POA: Insufficient documentation

## 2011-09-10 DIAGNOSIS — I10 Essential (primary) hypertension: Secondary | ICD-10-CM | POA: Diagnosis not present

## 2011-09-10 DIAGNOSIS — R0602 Shortness of breath: Secondary | ICD-10-CM | POA: Insufficient documentation

## 2011-09-10 DIAGNOSIS — F411 Generalized anxiety disorder: Secondary | ICD-10-CM | POA: Insufficient documentation

## 2011-09-10 DIAGNOSIS — I200 Unstable angina: Secondary | ICD-10-CM | POA: Diagnosis present

## 2011-09-10 DIAGNOSIS — E7800 Pure hypercholesterolemia, unspecified: Secondary | ICD-10-CM | POA: Diagnosis not present

## 2011-09-10 HISTORY — DX: Anxiety disorder, unspecified: F41.9

## 2011-09-10 HISTORY — PX: LEFT HEART CATHETERIZATION WITH CORONARY ANGIOGRAM: SHX5451

## 2011-09-10 HISTORY — DX: Hyperlipidemia, unspecified: E78.5

## 2011-09-10 HISTORY — DX: Essential (primary) hypertension: I10

## 2011-09-10 HISTORY — DX: Gastro-esophageal reflux disease without esophagitis: K21.9

## 2011-09-10 HISTORY — DX: Unstable angina: I20.0

## 2011-09-10 LAB — BASIC METABOLIC PANEL
Calcium: 9.1 mg/dL (ref 8.4–10.5)
Creatinine, Ser: 0.81 mg/dL (ref 0.50–1.10)
GFR calc Af Amer: 86 mL/min — ABNORMAL LOW (ref >90)
Sodium: 140 mEq/L (ref 135–145)

## 2011-09-10 LAB — PROTIME-INR: Prothrombin Time: 13.4 seconds (ref 11.6–15.2)

## 2011-09-10 SURGERY — LEFT HEART CATHETERIZATION WITH CORONARY ANGIOGRAM
Anesthesia: LOCAL | Laterality: Bilateral

## 2011-09-10 MED ORDER — SODIUM CHLORIDE 0.9 % IJ SOLN: 3.0000 mL | INTRAMUSCULAR | Status: DC | PRN

## 2011-09-10 MED ORDER — NITROGLYCERIN 0.2 MG/ML ON CALL CATH LAB
INTRAVENOUS | Status: AC
  Filled 2011-09-10: qty 1

## 2011-09-10 MED ORDER — DIAZEPAM 5 MG PO TABS
5.0000 mg | ORAL_TABLET | Freq: Once | ORAL | Status: AC
  Administered 2011-09-10: 5 mg via ORAL

## 2011-09-10 MED ORDER — MIDAZOLAM HCL 2 MG/2ML IJ SOLN
INTRAMUSCULAR | Status: AC
  Filled 2011-09-10: qty 2

## 2011-09-10 MED ORDER — HEPARIN SODIUM (PORCINE) 1000 UNIT/ML IJ SOLN
INTRAMUSCULAR | Status: AC
  Filled 2011-09-10: qty 1

## 2011-09-10 MED ORDER — SODIUM CHLORIDE 0.9 % IV SOLN
INTRAVENOUS | Status: DC
  Administered 2011-09-10: 11:00:00 via INTRAVENOUS

## 2011-09-10 MED ORDER — DIAZEPAM 5 MG PO TABS
ORAL_TABLET | ORAL | Status: AC
  Administered 2011-09-10: 1 mg
  Filled 2011-09-10: qty 1

## 2011-09-10 MED ORDER — VERAPAMIL HCL 2.5 MG/ML IV SOLN
INTRAVENOUS | Status: AC
  Filled 2011-09-10: qty 2

## 2011-09-10 MED ORDER — LORAZEPAM 2 MG/ML IJ SOLN: 1.0000 mg | Freq: Once | INTRAMUSCULAR | Status: DC

## 2011-09-10 MED ORDER — ASPIRIN 81 MG PO CHEW
CHEWABLE_TABLET | ORAL | Status: AC
  Filled 2011-09-10: qty 4

## 2011-09-10 MED ORDER — LIDOCAINE HCL (PF) 1 % IJ SOLN
INTRAMUSCULAR | Status: AC
  Filled 2011-09-10: qty 30

## 2011-09-10 MED ORDER — DIAZEPAM 5 MG PO TABS
5.0000 mg | ORAL_TABLET | ORAL | Status: AC
  Administered 2011-09-10: 5 mg via ORAL

## 2011-09-10 MED ORDER — HEPARIN (PORCINE) IN NACL 2-0.9 UNIT/ML-% IJ SOLN
INTRAMUSCULAR | Status: AC
  Filled 2011-09-10: qty 2000

## 2011-09-10 MED ORDER — LORAZEPAM 2 MG/ML IJ SOLN
INTRAMUSCULAR | Status: AC
  Filled 2011-09-10: qty 1

## 2011-09-10 MED ORDER — ASPIRIN 81 MG PO CHEW
324.0000 mg | CHEWABLE_TABLET | ORAL | Status: AC
  Administered 2011-09-10: 324 mg via ORAL

## 2011-09-10 MED ORDER — DIAZEPAM 5 MG PO TABS
ORAL_TABLET | ORAL | Status: AC
  Filled 2011-09-10: qty 1

## 2011-09-10 MED ORDER — FENTANYL CITRATE 0.05 MG/ML IJ SOLN
INTRAMUSCULAR | Status: AC
  Filled 2011-09-10: qty 2

## 2011-09-10 NOTE — H&P (Signed)
    Pt was reexamined and existing H & P reviewed. No changes found.  Runell Gess, MD [facility] 09/10/2011 2:42 PM

## 2011-09-10 NOTE — CV Procedure (Signed)
[name] is a 66 y.o. female    [id number] LOCATION:  FACILITY: [facility]  PHYSICIAN: Nanetta Batty, M.D. 17-Jun-1945   DATE OF PROCEDURE:  09/10/2011  DATE OF DISCHARGE:  [facility]  CARDIAC CATHETERIZATION     History obtained from chart review. 66 year old white, married, female with no prior history of CAD presents to the office at referral of Dr. Clelia Croft secondary to ER visit early AM after awakening from sleep with chest pain with radiation from rt. Chest to rt. scapula Associated with SOB, Nausea and diaphoresis. The severity of the pain was resolving enroute to ER and it was resolved in the ER. Her cardiac markers were negative, chest x-ray WNL. She was discharged and followed up in the office today. Currently occ. Mild chest tightness.  She also stated she has been having increased SOB with exertion over the last 2 months increasing more in the last 2 weeks. This is frequently associated with chest tightness. Resolves with rest. No nausea until last pm. She presents today for outpatient diagnostic cardiac catheterization via the right radial approach.    PROCEDURE DESCRIPTION:    The patient was brought to the second floor  [place] [facility] cath lab in the postabsorptive state. She was  premedicated with Valium 5 mg by mouth, IV Ativan Versed and fentanyl.. Her right wrist was prepped and shaved in usual sterile fashion. Xylocaine 1% was used  for local anesthesia. A 5 French sheath was inserted into the right radial  artery using standard Seldinger technique. The patient received  5000 units  of heparin  intravenously.  She received 10 cc of "radial cocktai'l via the SideArm sheath. A 5 [place] TIG catheter was used for selective coronary angiography. Left ventriculography was not performed because she had previously had a 2-D echo and there was evidence of "radial spasm". Visipaque dye was used for the entirety of the case. Retrograde aortic  pressure was monitored in the case.    HEMODYNAMICS:    AO SYSTOLIC/AO DIASTOLIC: 140/91    ANGIOGRAPHIC RESULTS:   1. Left main; normal  2. LAD; the LAD itself was normal. The first and second diagonal branches were small and had 80% ostial stenoses. 3. Left circumflex; normal.  4. 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 Continue medical therapy will be recommended. The radial sheath was removed and a curet was placed on the right wrist which is patent hemostasis. The patient left lung stable condition. She'll be gently hydrated and discharged home. I'll see her in the office in one to 2 weeks for followup. Dr. Eric Form at [facility] was notified of these results.  Runell Gess MD, [facility] 09/10/2011 3:21 PM

## 2011-09-10 NOTE — Discharge Instructions (Signed)
Radial Site Care Refer to this sheet in the next few weeks. These instructions provide you with information on caring for yourself after your procedure. Your caregiver may also give you more specific instructions. Your treatment has been planned according to current medical practices, but problems sometimes occur. Call your caregiver if you have any problems or questions after your procedure. HOME CARE INSTRUCTIONS  You may shower the day after the procedure.Remove the bandage (dressing) and gently wash the site with plain soap and water.Gently pat the site dry.   Do not apply powder or lotion to the site.   Do not submerge the affected site in water for 3 to 5 days.   Inspect the site at least twice daily.   Do not flex or bend the affected arm for 24 hours.   No lifting over 5 pounds (2.3 kg) for 5 days after your procedure.   Do not drive home if you are discharged the same day of the procedure. Have someone else drive you.   You may drive 24 hours after the procedure unless otherwise instructed by your caregiver.   Do not operate machinery or power tools for 24 hours.   A responsible adult should be with you for the first 24 hours after you arrive home.  What to expect:  Any bruising will usually fade within 1 to 2 weeks.   Blood that collects in the tissue (hematoma) may be painful to the touch. It should usually decrease in size and tenderness within 1 to 2 weeks.  SEEK IMMEDIATE MEDICAL CARE IF:  You have unusual pain at the radial site.   You have redness, warmth, swelling, or pain at the radial site.   You have drainage (other than a small amount of blood on the dressing).   You have chills.   You have a fever or persistent symptoms for more than 72 hours.   You have a fever and your symptoms suddenly get worse.   Your arm becomes pale, cool, tingly, or numb.   You have heavy bleeding from the site. Hold pressure on the site.  Document Released: 04/10/2010  Document Revised: 02/25/2011 Document Reviewed: 04/10/2010 [employer] Patient Information 2012 [employer]. 

## 2011-09-10 NOTE — Progress Notes (Signed)
Pt very anxious even after valium.  I have explained that due to prolonged procedures her cath will be around 1500.  Will give extra valium now.

## 2011-09-13 DIAGNOSIS — F341 Dysthymic disorder: Secondary | ICD-10-CM | POA: Diagnosis not present

## 2011-09-13 DIAGNOSIS — J45909 Unspecified asthma, uncomplicated: Secondary | ICD-10-CM | POA: Diagnosis not present

## 2011-09-13 DIAGNOSIS — R0602 Shortness of breath: Secondary | ICD-10-CM | POA: Diagnosis not present

## 2011-09-13 DIAGNOSIS — I10 Essential (primary) hypertension: Secondary | ICD-10-CM | POA: Diagnosis not present

## 2011-09-29 DIAGNOSIS — E782 Mixed hyperlipidemia: Secondary | ICD-10-CM | POA: Diagnosis not present

## 2011-09-29 DIAGNOSIS — R0602 Shortness of breath: Secondary | ICD-10-CM | POA: Diagnosis not present

## 2011-09-29 DIAGNOSIS — I10 Essential (primary) hypertension: Secondary | ICD-10-CM | POA: Diagnosis not present

## 2011-10-07 ENCOUNTER — Other Ambulatory Visit: Payer: Self-pay | Admitting: Internal Medicine

## 2011-10-07 DIAGNOSIS — J45909 Unspecified asthma, uncomplicated: Secondary | ICD-10-CM | POA: Diagnosis not present

## 2011-10-07 DIAGNOSIS — R0600 Dyspnea, unspecified: Secondary | ICD-10-CM

## 2011-10-07 DIAGNOSIS — J383 Other diseases of vocal cords: Secondary | ICD-10-CM | POA: Diagnosis not present

## 2011-10-07 DIAGNOSIS — R0602 Shortness of breath: Secondary | ICD-10-CM | POA: Diagnosis not present

## 2011-10-07 DIAGNOSIS — I10 Essential (primary) hypertension: Secondary | ICD-10-CM | POA: Diagnosis not present

## 2011-10-08 ENCOUNTER — Ambulatory Visit
Admission: RE | Admit: 2011-10-08 | Discharge: 2011-10-08 | Disposition: A | Discharge status: Home / Self Care | Payer: Medicare Other | Source: Ambulatory Visit | Attending: Internal Medicine | Admitting: Internal Medicine

## 2011-10-08 DIAGNOSIS — R0600 Dyspnea, unspecified: Secondary | ICD-10-CM

## 2011-10-08 DIAGNOSIS — R0602 Shortness of breath: Secondary | ICD-10-CM | POA: Diagnosis not present

## 2011-10-08 DIAGNOSIS — R0789 Other chest pain: Secondary | ICD-10-CM | POA: Diagnosis not present

## 2011-10-08 MED ORDER — IOHEXOL 300 MG/ML  SOLN
125.0000 mL | Freq: Once | INTRAMUSCULAR | Status: AC | PRN
  Administered 2011-10-08: 125 mL via INTRAVENOUS

## 2011-11-05 ENCOUNTER — Ambulatory Visit (INDEPENDENT_AMBULATORY_CARE_PROVIDER_SITE_OTHER): Payer: Medicare Other | Admitting: Pulmonary Disease

## 2011-11-05 ENCOUNTER — Encounter: Payer: Self-pay | Admitting: Pulmonary Disease

## 2011-11-05 VITALS — BP 126/82 | HR 96 | Temp 98.1°F | Ht 63.5 in | Wt 273.2 lb

## 2011-11-05 DIAGNOSIS — R0989 Other specified symptoms and signs involving the circulatory and respiratory systems: Secondary | ICD-10-CM | POA: Diagnosis not present

## 2011-11-05 DIAGNOSIS — R0609 Other forms of dyspnea: Secondary | ICD-10-CM | POA: Diagnosis not present

## 2011-11-05 DIAGNOSIS — R0600 Dyspnea, unspecified: Secondary | ICD-10-CM

## 2011-11-05 DIAGNOSIS — J45909 Unspecified asthma, uncomplicated: Secondary | ICD-10-CM | POA: Diagnosis not present

## 2011-11-05 DIAGNOSIS — Z6841 Body Mass Index (BMI) 40.0 and over, adult: Secondary | ICD-10-CM

## 2011-11-05 DIAGNOSIS — J383 Other diseases of vocal cords: Secondary | ICD-10-CM

## 2011-11-05 DIAGNOSIS — R05 Cough: Secondary | ICD-10-CM | POA: Insufficient documentation

## 2011-11-05 DIAGNOSIS — J45998 Other asthma: Secondary | ICD-10-CM | POA: Insufficient documentation

## 2011-11-05 NOTE — Assessment & Plan Note (Signed)
I do not think her current symptoms are related to her history of asthma.  I have requested that her records be sent from Dr. Kathyrn Lass office.  Will defer additional evaluation of her asthma.

## 2011-11-05 NOTE — Assessment & Plan Note (Signed)
She has significant weight gain, and relatively sedentary life style since her orthopedic surgeries.  She would likely benefit from an exercise/weight reduction program.  Certainly her weight and deconditioning are contributing to her dyspnea with exertion.

## 2011-11-05 NOTE — Assessment & Plan Note (Signed)
Likely related to vocal cord dysfunction, obesity, and deconditioning.

## 2011-11-05 NOTE — Assessment & Plan Note (Signed)
I think the majority of her current symptoms are related to vocal cord dysfunction.    I will arrange for there to be re-evaluated with [facility] in [place].

## 2011-11-05 NOTE — Progress Notes (Signed)
Chief Complaint  Patient presents with  . Advice Only    refer Dr. Clelia Croft. Pt c/o SOB w/ activity but doesn't usually last long, dry cough. Sometimes she coughs so much she will throat up. Also has terrible wheezing    History of Present Illness: [name] is a 66 y.o. female for evaluation of dyspnea.   She has notice trouble with her breathing for several years.  She has a history of asthma and has been followed by Dr. Lucie Leather.  There was concern that some of her asthma may be related to reflux, and she has been on several medicines for this.  There was concern that tenormin use could have contributed to her asthma and breathing difficulties.  This was stopped, but she still had difficulty with her breathing.  She gets hoarse frequently, especially when she talks a lot.  She gets a hacking cough, but not as much as before.  Her breathing seems to have gotten worse over the past 6 months.  She has tried using symbicort, but this made her gag and didn't help her breathing or cough.  She typically will have asthma symptoms when she gets bronchitis.  She will get wheezing in her chest when she has bronchitis.  Her current symptoms are related to wheezing in her upper chest and throat.  This happens more when she gets excited.  Once she slows her breathing down and relaxes her breathing and wheezing get better.  She has to frequently clear her throat.  She takes nasonex on a regular basis for her sinuses, and will occasionally use astepro.  She does not think her sinuses are causing too much trouble at present.  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  She does not notice much trouble around strong smells.  She has tried using albuterol when she gets her attacks, but this does not help.  She saw an ENT doctor at [facility] about 1.5 years ago.  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  She was seen by a speech therapist.  She felt the therapies offered were "voodoo."  She, however, was surprised that  the therapies did help some when she tried them.  She has not been doing any of the exercises recently.  She had knee surgery about 1 year ago.  Interestingly, her hoarseness and voice quality temporarily improved after she was intubated for her procedure.  After her surgery she gained about 25 lbs.  She has not been as active since her surgery.  She was admitted to the hospital in June 2013 chest pain.  She had a heart catheterization which was unremarkable.  She also had CT chest which was normal.   Past Medical History  Diagnosis Date  . Asthma   . Arthritis   . Hypertension   . Hypercholesteremia   . Unstable angina 09/09/2011  . HTN (hypertension) 09/09/2011  . Hyperlipemia 09/09/2011  . GERD (gastroesophageal reflux disease) 09/09/2011  . Anxiety     Past Surgical History  Procedure Date  . Back surgery [id number]    3 ruptured discs  . Joint replacement lt. knee 2002, rt. knee 05/2010, rt. ankle replacement 03/12/11,    . Cholecystectomy   . Abdominal hysterectomy   . Tumor excision 1964    rt. leg fatty tumor  . Shoulder surgery 198    rt. shoulder  . Wrist surgery 1997, 2009   . Left elbow surgery     removed bone chip  . Foot osteotomy  w/ plantar fascia release     left  . Right ankle reconstrucion and 2 fusions     Current Outpatient Prescriptions on File Prior to Visit  Medication Sig Dispense Refill  . albuterol (PROVENTIL HFA;VENTOLIN HFA) 108 (90 BASE) MCG/ACT inhaler Inhale 2 puffs into the lungs every 4 (four) hours as needed. Shortness of breath      . Azelastine HCl (ASTEPRO NA) As needed      . celecoxib (CELEBREX) 200 MG capsule Take 200 mg by mouth daily.      . cetirizine (ZYRTEC) 10 MG tablet Take 10 mg by mouth daily as needed. allergies      . losartan (COZAAR) 50 MG tablet Take 50 mg by mouth daily.      . mometasone (NASONEX) 50 MCG/ACT nasal spray Place 2 sprays into the nose 2 (two) times daily.      . nitroGLYCERIN (NITROSTAT) 0.4 MG SL  tablet Place 0.4 mg under the tongue every 5 (five) minutes as needed. Chest pain      . omeprazole (PRILOSEC) 20 MG capsule Take 20 mg by mouth 2 (two) times daily.      . ranitidine (ZANTAC) 300 MG tablet Take 300 mg by mouth at bedtime.      . simvastatin (ZOCOR) 20 MG tablet Take 20 mg by mouth every evening.        No Known Allergies  Family History  Problem Relation Age of Onset  . Esophageal cancer Mother   . Esophageal cancer Father   . Coronary artery disease Neg Hx     History  Substance Use Topics  . Smoking status: Passive Smoker  . Smokeless tobacco: Never Used   Comment: as a teenager tried a couple times  . Alcohol Use: No    Review of Systems  Constitutional: Positive for unexpected weight change. Negative for fever and appetite change.  HENT: Negative for ear pain, congestion, sore throat, sneezing, trouble swallowing and dental problem.   Respiratory: Positive for cough and shortness of breath.   Cardiovascular: Negative for chest pain, palpitations and leg swelling.  Gastrointestinal: Negative for abdominal pain.  Musculoskeletal: Positive for arthralgias.  Skin: Negative for rash.  Neurological: Negative for headaches.  Psychiatric/Behavioral: Negative for dysphoric mood. The patient is not nervous/anxious.    Physical Exam: Filed Vitals:   11/05/11 1504 11/05/11 1506  BP:  126/82  Pulse:  96  Temp: 98.1 F (36.7 C)   TempSrc: Oral   Height: 5' 3.5" (1.613 m)   Weight: 273 lb 3.2 oz (123.923 kg)   SpO2:  100%  ,  Current Encounter SPO2  11/05/11 1506 100%  09/10/11 1720 98%  09/10/11 1705 97%  09/10/11 1650 97%  09/10/11 1028 96%  09/10/11 1720 98%  09/10/11 1705 97%  09/10/11 1650 97%  09/10/11 1028 96%    Wt Readings from Last 3 Encounters:  11/05/11 273 lb 3.2 oz (123.923 kg)  09/10/11 250 lb (113.399 kg)  09/10/11 250 lb (113.399 kg)    Body mass index is 47.64 kg/(m^2).   General - No distress, obese ENT - TM clear, no sinus  tenderness, no oral exudate, no LAN, no thyromegaly Cardiac - s1s2 regular, no murmur, pulses symmetric, no edema Chest - normal respiratory excursion, good air entry, no wheeze/rales/dullness Back - no focal tenderness Abd - soft, non-tender, no organomegaly, + bowel sounds Ext - normal motor strength Neuro - Cranial nerves are normal. PERLA. EOM's intact. Skin - no discernible active dermatitis,  erythema, urticaria or inflammatory process. Psych - normal mood, and behavior.   Ct Angio Chest W/cm &/or Wo Cm  10/08/2011  *RADIOLOGY REPORT*  Clinical Data: Shortness of breath, chest pressure, evaluate for PE  CT ANGIOGRAPHY CHEST  Technique:  Multidetector CT imaging of the chest using the standard protocol during bolus administration of intravenous contrast. Multiplanar reconstructed images including MIPs were obtained and reviewed to evaluate the vascular anatomy.  Contrast: OMNIPAQUE IOHEXOL 300 MG/ML  SOLN  Comparison: Chest radiograph dated 09/07/2011  Findings: No evidence of pulmonary embolism.  Mild mosaic attenuation.  Lungs are otherwise clear.  No suspicious pulmonary nodules.  No pleural effusion or pneumothorax.  Visualized thyroid is unremarkable.  The heart is normal in size.  No pericardial effusion.  Coronary atherosclerosis.  Dialysis fascia is mediastinal, hilar, or axillary lymphadenopathy.  Visualized upper abdomen is notable for cholecystectomy clips.  Degenerative changes of the visualized thoracolumbar spine.  IMPRESSION: No evidence of pulmonary embolism.  No evidence of acute cardiopulmonary disease.  Original Report Authenticated By: Charline Bills, M.D.    Lab Results  Component Value Date   WBC 6.0 09/07/2011   HGB 11.4* 09/07/2011   HCT 35.8* 09/07/2011   MCV 80.1 09/07/2011   PLT 158 09/07/2011    Lab Results  Component Value Date   CREATININE 0.81 09/10/2011   BUN 19 09/10/2011   NA 140 09/10/2011   K 3.8 09/10/2011   CL 105 09/10/2011   CO2 23 09/10/2011     Lab Results  Component Value Date   ALT 19 06/09/2010   AST 25 06/09/2010   ALKPHOS 80 06/09/2010   BILITOT 0.4 06/09/2010     Assessment/Plan:  Coralyn Helling, MD [facility] Pager:  [phone] 11/05/2011, 3:11 PM

## 2011-11-05 NOTE — Progress Notes (Deleted)
  Subjective:    Patient ID: [name], female    DOB: 01-04-1946, 66 y.o.   MRN: [id number]  HPI    Review of Systems  Constitutional: Positive for unexpected weight change. Negative for fever and appetite change.  HENT: Negative for ear pain, congestion, sore throat, sneezing, trouble swallowing and dental problem.   Respiratory: Positive for cough and shortness of breath.   Cardiovascular: Negative for chest pain, palpitations and leg swelling.  Gastrointestinal: Negative for abdominal pain.  Musculoskeletal: Positive for arthralgias.  Skin: Negative for rash.  Neurological: Negative for headaches.  Psychiatric/Behavioral: Negative for dysphoric mood. The patient is not nervous/anxious.        Objective:   Physical Exam        Assessment & Plan:

## 2011-11-05 NOTE — Patient Instructions (Signed)
Will arrange for evaluation at [facility] in [place] Will get records from Dr. Kathyrn Lass office Follow up in 2 months

## 2011-11-12 DIAGNOSIS — M169 Osteoarthritis of hip, unspecified: Secondary | ICD-10-CM | POA: Diagnosis not present

## 2011-11-12 DIAGNOSIS — Z96669 Presence of unspecified artificial ankle joint: Secondary | ICD-10-CM | POA: Diagnosis not present

## 2011-11-12 DIAGNOSIS — Z96659 Presence of unspecified artificial knee joint: Secondary | ICD-10-CM | POA: Diagnosis not present

## 2011-11-30 ENCOUNTER — Other Ambulatory Visit: Payer: Self-pay | Admitting: Dermatology

## 2011-11-30 DIAGNOSIS — D485 Neoplasm of uncertain behavior of skin: Secondary | ICD-10-CM | POA: Diagnosis not present

## 2011-11-30 DIAGNOSIS — L820 Inflamed seborrheic keratosis: Secondary | ICD-10-CM | POA: Diagnosis not present

## 2011-11-30 DIAGNOSIS — L570 Actinic keratosis: Secondary | ICD-10-CM | POA: Diagnosis not present

## 2011-11-30 DIAGNOSIS — B079 Viral wart, unspecified: Secondary | ICD-10-CM | POA: Diagnosis not present

## 2011-11-30 DIAGNOSIS — D2370 Other benign neoplasm of skin of unspecified lower limb, including hip: Secondary | ICD-10-CM | POA: Diagnosis not present

## 2011-11-30 DIAGNOSIS — L919 Hypertrophic disorder of the skin, unspecified: Secondary | ICD-10-CM | POA: Diagnosis not present

## 2011-11-30 DIAGNOSIS — L821 Other seborrheic keratosis: Secondary | ICD-10-CM | POA: Diagnosis not present

## 2011-11-30 DIAGNOSIS — L909 Atrophic disorder of skin, unspecified: Secondary | ICD-10-CM | POA: Diagnosis not present

## 2011-12-07 DIAGNOSIS — Z23 Encounter for immunization: Secondary | ICD-10-CM | POA: Diagnosis not present

## 2011-12-14 DIAGNOSIS — M47817 Spondylosis without myelopathy or radiculopathy, lumbosacral region: Secondary | ICD-10-CM | POA: Diagnosis not present

## 2011-12-14 DIAGNOSIS — M169 Osteoarthritis of hip, unspecified: Secondary | ICD-10-CM | POA: Diagnosis not present

## 2011-12-21 DIAGNOSIS — J309 Allergic rhinitis, unspecified: Secondary | ICD-10-CM | POA: Diagnosis not present

## 2011-12-21 DIAGNOSIS — J45909 Unspecified asthma, uncomplicated: Secondary | ICD-10-CM | POA: Diagnosis not present

## 2012-01-04 DIAGNOSIS — M5137 Other intervertebral disc degeneration, lumbosacral region: Secondary | ICD-10-CM | POA: Diagnosis not present

## 2012-01-04 DIAGNOSIS — M1990 Unspecified osteoarthritis, unspecified site: Secondary | ICD-10-CM | POA: Diagnosis not present

## 2012-01-04 DIAGNOSIS — M47817 Spondylosis without myelopathy or radiculopathy, lumbosacral region: Secondary | ICD-10-CM | POA: Diagnosis not present

## 2012-01-04 DIAGNOSIS — IMO0002 Reserved for concepts with insufficient information to code with codable children: Secondary | ICD-10-CM | POA: Diagnosis not present

## 2012-01-17 DIAGNOSIS — K219 Gastro-esophageal reflux disease without esophagitis: Secondary | ICD-10-CM | POA: Diagnosis not present

## 2012-01-17 DIAGNOSIS — R490 Dysphonia: Secondary | ICD-10-CM | POA: Diagnosis not present

## 2012-01-17 DIAGNOSIS — J385 Laryngeal spasm: Secondary | ICD-10-CM | POA: Diagnosis not present

## 2012-01-20 DIAGNOSIS — J383 Other diseases of vocal cords: Secondary | ICD-10-CM | POA: Diagnosis not present

## 2012-01-20 DIAGNOSIS — M5137 Other intervertebral disc degeneration, lumbosacral region: Secondary | ICD-10-CM | POA: Diagnosis not present

## 2012-01-20 DIAGNOSIS — Z96669 Presence of unspecified artificial ankle joint: Secondary | ICD-10-CM | POA: Diagnosis not present

## 2012-01-20 DIAGNOSIS — Z79899 Other long term (current) drug therapy: Secondary | ICD-10-CM | POA: Diagnosis not present

## 2012-01-20 DIAGNOSIS — I10 Essential (primary) hypertension: Secondary | ICD-10-CM | POA: Diagnosis not present

## 2012-01-20 DIAGNOSIS — M76899 Other specified enthesopathies of unspecified lower limb, excluding foot: Secondary | ICD-10-CM | POA: Diagnosis not present

## 2012-01-20 DIAGNOSIS — Z96659 Presence of unspecified artificial knee joint: Secondary | ICD-10-CM | POA: Diagnosis not present

## 2012-01-20 DIAGNOSIS — J45909 Unspecified asthma, uncomplicated: Secondary | ICD-10-CM | POA: Diagnosis not present

## 2012-01-20 DIAGNOSIS — K219 Gastro-esophageal reflux disease without esophagitis: Secondary | ICD-10-CM | POA: Diagnosis not present

## 2012-01-20 DIAGNOSIS — E785 Hyperlipidemia, unspecified: Secondary | ICD-10-CM | POA: Diagnosis not present

## 2012-01-20 DIAGNOSIS — M1990 Unspecified osteoarthritis, unspecified site: Secondary | ICD-10-CM | POA: Diagnosis not present

## 2012-02-01 DIAGNOSIS — Z1231 Encounter for screening mammogram for malignant neoplasm of breast: Secondary | ICD-10-CM | POA: Diagnosis not present

## 2012-02-08 DIAGNOSIS — K219 Gastro-esophageal reflux disease without esophagitis: Secondary | ICD-10-CM | POA: Diagnosis not present

## 2012-02-08 DIAGNOSIS — D131 Benign neoplasm of stomach: Secondary | ICD-10-CM | POA: Diagnosis not present

## 2012-02-08 DIAGNOSIS — I10 Essential (primary) hypertension: Secondary | ICD-10-CM | POA: Diagnosis not present

## 2012-02-08 DIAGNOSIS — K449 Diaphragmatic hernia without obstruction or gangrene: Secondary | ICD-10-CM | POA: Diagnosis not present

## 2012-02-08 DIAGNOSIS — J449 Chronic obstructive pulmonary disease, unspecified: Secondary | ICD-10-CM | POA: Diagnosis not present

## 2012-02-08 DIAGNOSIS — R490 Dysphonia: Secondary | ICD-10-CM | POA: Diagnosis not present

## 2012-02-08 DIAGNOSIS — Z9889 Other specified postprocedural states: Secondary | ICD-10-CM | POA: Diagnosis not present

## 2012-02-08 DIAGNOSIS — M129 Arthropathy, unspecified: Secondary | ICD-10-CM | POA: Diagnosis not present

## 2012-02-08 DIAGNOSIS — J45909 Unspecified asthma, uncomplicated: Secondary | ICD-10-CM | POA: Diagnosis not present

## 2012-02-14 DIAGNOSIS — R82998 Other abnormal findings in urine: Secondary | ICD-10-CM | POA: Diagnosis not present

## 2012-02-14 DIAGNOSIS — E785 Hyperlipidemia, unspecified: Secondary | ICD-10-CM | POA: Diagnosis not present

## 2012-02-14 DIAGNOSIS — I10 Essential (primary) hypertension: Secondary | ICD-10-CM | POA: Diagnosis not present

## 2012-02-14 DIAGNOSIS — E559 Vitamin D deficiency, unspecified: Secondary | ICD-10-CM | POA: Diagnosis not present

## 2012-02-21 DIAGNOSIS — R7301 Impaired fasting glucose: Secondary | ICD-10-CM | POA: Diagnosis not present

## 2012-02-21 DIAGNOSIS — Z23 Encounter for immunization: Secondary | ICD-10-CM | POA: Diagnosis not present

## 2012-02-21 DIAGNOSIS — Z1331 Encounter for screening for depression: Secondary | ICD-10-CM | POA: Diagnosis not present

## 2012-02-21 DIAGNOSIS — Z0000 Encounter for general adult medical examination without abnormal findings: Secondary | ICD-10-CM | POA: Diagnosis not present

## 2012-02-21 DIAGNOSIS — I10 Essential (primary) hypertension: Secondary | ICD-10-CM | POA: Diagnosis not present

## 2012-02-21 DIAGNOSIS — E785 Hyperlipidemia, unspecified: Secondary | ICD-10-CM | POA: Diagnosis not present

## 2012-02-24 DIAGNOSIS — N3946 Mixed incontinence: Secondary | ICD-10-CM | POA: Diagnosis not present

## 2012-02-24 DIAGNOSIS — Z124 Encounter for screening for malignant neoplasm of cervix: Secondary | ICD-10-CM | POA: Diagnosis not present

## 2012-02-24 DIAGNOSIS — Z01419 Encounter for gynecological examination (general) (routine) without abnormal findings: Secondary | ICD-10-CM | POA: Diagnosis not present

## 2012-03-01 DIAGNOSIS — M1990 Unspecified osteoarthritis, unspecified site: Secondary | ICD-10-CM | POA: Diagnosis not present

## 2012-03-01 DIAGNOSIS — M47817 Spondylosis without myelopathy or radiculopathy, lumbosacral region: Secondary | ICD-10-CM | POA: Diagnosis not present

## 2012-03-01 DIAGNOSIS — M5137 Other intervertebral disc degeneration, lumbosacral region: Secondary | ICD-10-CM | POA: Diagnosis not present

## 2012-03-01 DIAGNOSIS — IMO0002 Reserved for concepts with insufficient information to code with codable children: Secondary | ICD-10-CM | POA: Diagnosis not present

## 2012-03-09 DIAGNOSIS — M79609 Pain in unspecified limb: Secondary | ICD-10-CM | POA: Diagnosis not present

## 2012-03-09 DIAGNOSIS — S8290XS Unspecified fracture of unspecified lower leg, sequela: Secondary | ICD-10-CM | POA: Diagnosis not present

## 2012-03-09 DIAGNOSIS — M7989 Other specified soft tissue disorders: Secondary | ICD-10-CM | POA: Diagnosis not present

## 2012-03-09 DIAGNOSIS — S92919A Unspecified fracture of unspecified toe(s), initial encounter for closed fracture: Secondary | ICD-10-CM | POA: Diagnosis not present

## 2012-03-09 DIAGNOSIS — M19079 Primary osteoarthritis, unspecified ankle and foot: Secondary | ICD-10-CM | POA: Diagnosis not present

## 2012-03-24 DIAGNOSIS — H04129 Dry eye syndrome of unspecified lacrimal gland: Secondary | ICD-10-CM | POA: Diagnosis not present

## 2012-03-24 DIAGNOSIS — H5710 Ocular pain, unspecified eye: Secondary | ICD-10-CM | POA: Diagnosis not present

## 2012-04-18 DIAGNOSIS — H5710 Ocular pain, unspecified eye: Secondary | ICD-10-CM | POA: Diagnosis not present

## 2012-04-18 DIAGNOSIS — H04129 Dry eye syndrome of unspecified lacrimal gland: Secondary | ICD-10-CM | POA: Diagnosis not present

## 2012-05-09 DIAGNOSIS — M19049 Primary osteoarthritis, unspecified hand: Secondary | ICD-10-CM | POA: Diagnosis not present

## 2012-05-22 DIAGNOSIS — E669 Obesity, unspecified: Secondary | ICD-10-CM | POA: Diagnosis not present

## 2012-05-22 DIAGNOSIS — R7301 Impaired fasting glucose: Secondary | ICD-10-CM | POA: Diagnosis not present

## 2012-05-22 DIAGNOSIS — I10 Essential (primary) hypertension: Secondary | ICD-10-CM | POA: Diagnosis not present

## 2012-05-22 DIAGNOSIS — M549 Dorsalgia, unspecified: Secondary | ICD-10-CM | POA: Diagnosis not present

## 2012-06-13 DIAGNOSIS — M19049 Primary osteoarthritis, unspecified hand: Secondary | ICD-10-CM | POA: Diagnosis not present

## 2012-06-21 DIAGNOSIS — G5630 Lesion of radial nerve, unspecified upper limb: Secondary | ICD-10-CM | POA: Diagnosis not present

## 2012-06-21 DIAGNOSIS — M654 Radial styloid tenosynovitis [de Quervain]: Secondary | ICD-10-CM | POA: Diagnosis not present

## 2012-06-21 DIAGNOSIS — M19049 Primary osteoarthritis, unspecified hand: Secondary | ICD-10-CM | POA: Diagnosis not present

## 2012-06-29 DIAGNOSIS — G5690 Unspecified mononeuropathy of unspecified upper limb: Secondary | ICD-10-CM | POA: Diagnosis not present

## 2012-06-29 DIAGNOSIS — M654 Radial styloid tenosynovitis [de Quervain]: Secondary | ICD-10-CM | POA: Diagnosis not present

## 2012-06-29 DIAGNOSIS — M19049 Primary osteoarthritis, unspecified hand: Secondary | ICD-10-CM | POA: Diagnosis not present

## 2012-07-13 DIAGNOSIS — R82998 Other abnormal findings in urine: Secondary | ICD-10-CM | POA: Diagnosis not present

## 2012-07-13 DIAGNOSIS — R300 Dysuria: Secondary | ICD-10-CM | POA: Diagnosis not present

## 2012-07-31 DIAGNOSIS — H43819 Vitreous degeneration, unspecified eye: Secondary | ICD-10-CM | POA: Diagnosis not present

## 2012-08-21 DIAGNOSIS — H43819 Vitreous degeneration, unspecified eye: Secondary | ICD-10-CM | POA: Diagnosis not present

## 2012-08-25 DIAGNOSIS — J45909 Unspecified asthma, uncomplicated: Secondary | ICD-10-CM | POA: Diagnosis not present

## 2012-08-25 DIAGNOSIS — I10 Essential (primary) hypertension: Secondary | ICD-10-CM | POA: Diagnosis not present

## 2012-08-25 DIAGNOSIS — Z6841 Body Mass Index (BMI) 40.0 and over, adult: Secondary | ICD-10-CM | POA: Diagnosis not present

## 2012-08-25 DIAGNOSIS — E669 Obesity, unspecified: Secondary | ICD-10-CM | POA: Diagnosis not present

## 2012-08-25 DIAGNOSIS — R7301 Impaired fasting glucose: Secondary | ICD-10-CM | POA: Diagnosis not present

## 2012-08-25 DIAGNOSIS — F341 Dysthymic disorder: Secondary | ICD-10-CM | POA: Diagnosis not present

## 2012-08-25 DIAGNOSIS — E785 Hyperlipidemia, unspecified: Secondary | ICD-10-CM | POA: Diagnosis not present

## 2012-08-25 DIAGNOSIS — J383 Other diseases of vocal cords: Secondary | ICD-10-CM | POA: Diagnosis not present

## 2012-09-05 DIAGNOSIS — M25539 Pain in unspecified wrist: Secondary | ICD-10-CM | POA: Diagnosis not present

## 2012-09-08 DIAGNOSIS — M25539 Pain in unspecified wrist: Secondary | ICD-10-CM | POA: Diagnosis not present

## 2012-09-11 DIAGNOSIS — M25539 Pain in unspecified wrist: Secondary | ICD-10-CM | POA: Diagnosis not present

## 2012-09-13 DIAGNOSIS — M25539 Pain in unspecified wrist: Secondary | ICD-10-CM | POA: Diagnosis not present

## 2012-09-18 DIAGNOSIS — M25539 Pain in unspecified wrist: Secondary | ICD-10-CM | POA: Diagnosis not present

## 2012-09-21 DIAGNOSIS — M25539 Pain in unspecified wrist: Secondary | ICD-10-CM | POA: Diagnosis not present

## 2012-09-25 DIAGNOSIS — I10 Essential (primary) hypertension: Secondary | ICD-10-CM | POA: Diagnosis not present

## 2012-09-25 DIAGNOSIS — M25539 Pain in unspecified wrist: Secondary | ICD-10-CM | POA: Diagnosis not present

## 2012-09-28 DIAGNOSIS — M25539 Pain in unspecified wrist: Secondary | ICD-10-CM | POA: Diagnosis not present

## 2012-10-03 DIAGNOSIS — M25539 Pain in unspecified wrist: Secondary | ICD-10-CM | POA: Diagnosis not present

## 2012-10-17 DIAGNOSIS — M25539 Pain in unspecified wrist: Secondary | ICD-10-CM | POA: Diagnosis not present

## 2012-10-19 DIAGNOSIS — M25539 Pain in unspecified wrist: Secondary | ICD-10-CM | POA: Diagnosis not present

## 2012-11-14 DIAGNOSIS — K219 Gastro-esophageal reflux disease without esophagitis: Secondary | ICD-10-CM | POA: Diagnosis not present

## 2012-11-14 DIAGNOSIS — R1011 Right upper quadrant pain: Secondary | ICD-10-CM | POA: Diagnosis not present

## 2012-11-16 DIAGNOSIS — R1013 Epigastric pain: Secondary | ICD-10-CM | POA: Diagnosis not present

## 2012-11-16 DIAGNOSIS — K219 Gastro-esophageal reflux disease without esophagitis: Secondary | ICD-10-CM | POA: Diagnosis not present

## 2012-11-16 DIAGNOSIS — K2960 Other gastritis without bleeding: Secondary | ICD-10-CM | POA: Diagnosis not present

## 2012-11-16 DIAGNOSIS — K449 Diaphragmatic hernia without obstruction or gangrene: Secondary | ICD-10-CM | POA: Diagnosis not present

## 2012-11-16 DIAGNOSIS — R1011 Right upper quadrant pain: Secondary | ICD-10-CM | POA: Diagnosis not present

## 2012-11-16 DIAGNOSIS — D131 Benign neoplasm of stomach: Secondary | ICD-10-CM | POA: Diagnosis not present

## 2012-11-23 ENCOUNTER — Other Ambulatory Visit: Payer: Self-pay | Admitting: Gastroenterology

## 2012-11-23 DIAGNOSIS — R109 Unspecified abdominal pain: Secondary | ICD-10-CM

## 2012-11-24 ENCOUNTER — Ambulatory Visit
Admission: RE | Admit: 2012-11-24 | Discharge: 2012-11-24 | Disposition: A | Discharge status: Home / Self Care | Payer: Medicare Other | Source: Ambulatory Visit | Attending: Gastroenterology | Admitting: Gastroenterology

## 2012-11-24 DIAGNOSIS — K449 Diaphragmatic hernia without obstruction or gangrene: Secondary | ICD-10-CM | POA: Diagnosis not present

## 2012-11-24 DIAGNOSIS — R109 Unspecified abdominal pain: Secondary | ICD-10-CM

## 2012-11-24 MED ORDER — IOHEXOL 300 MG/ML  SOLN
30.0000 mL | Freq: Once | INTRAMUSCULAR | Status: AC | PRN
  Administered 2012-11-24: 30 mL via ORAL

## 2012-11-24 MED ORDER — IOHEXOL 300 MG/ML  SOLN
125.0000 mL | Freq: Once | INTRAMUSCULAR | Status: AC | PRN
  Administered 2012-11-24: 125 mL via INTRAVENOUS

## 2012-12-08 DIAGNOSIS — R1011 Right upper quadrant pain: Secondary | ICD-10-CM | POA: Diagnosis not present

## 2012-12-12 DIAGNOSIS — M5137 Other intervertebral disc degeneration, lumbosacral region: Secondary | ICD-10-CM | POA: Diagnosis not present

## 2012-12-12 DIAGNOSIS — Z96659 Presence of unspecified artificial knee joint: Secondary | ICD-10-CM | POA: Diagnosis not present

## 2012-12-12 DIAGNOSIS — M47817 Spondylosis without myelopathy or radiculopathy, lumbosacral region: Secondary | ICD-10-CM | POA: Diagnosis not present

## 2012-12-12 DIAGNOSIS — J45909 Unspecified asthma, uncomplicated: Secondary | ICD-10-CM | POA: Diagnosis not present

## 2012-12-12 DIAGNOSIS — I10 Essential (primary) hypertension: Secondary | ICD-10-CM | POA: Diagnosis not present

## 2012-12-12 DIAGNOSIS — J383 Other diseases of vocal cords: Secondary | ICD-10-CM | POA: Diagnosis not present

## 2012-12-12 DIAGNOSIS — E785 Hyperlipidemia, unspecified: Secondary | ICD-10-CM | POA: Diagnosis not present

## 2012-12-12 DIAGNOSIS — Z79899 Other long term (current) drug therapy: Secondary | ICD-10-CM | POA: Diagnosis not present

## 2012-12-12 DIAGNOSIS — K219 Gastro-esophageal reflux disease without esophagitis: Secondary | ICD-10-CM | POA: Diagnosis not present

## 2012-12-12 DIAGNOSIS — M76899 Other specified enthesopathies of unspecified lower limb, excluding foot: Secondary | ICD-10-CM | POA: Diagnosis not present

## 2012-12-12 DIAGNOSIS — Z96669 Presence of unspecified artificial ankle joint: Secondary | ICD-10-CM | POA: Diagnosis not present

## 2012-12-12 DIAGNOSIS — M1990 Unspecified osteoarthritis, unspecified site: Secondary | ICD-10-CM | POA: Diagnosis not present

## 2012-12-26 DIAGNOSIS — M25569 Pain in unspecified knee: Secondary | ICD-10-CM | POA: Diagnosis not present

## 2012-12-28 DIAGNOSIS — Z23 Encounter for immunization: Secondary | ICD-10-CM | POA: Diagnosis not present

## 2013-01-02 ENCOUNTER — Encounter: Payer: Self-pay | Admitting: *Deleted

## 2013-01-04 ENCOUNTER — Other Ambulatory Visit (INDEPENDENT_AMBULATORY_CARE_PROVIDER_SITE_OTHER): Payer: Medicare Other

## 2013-01-04 ENCOUNTER — Ambulatory Visit (INDEPENDENT_AMBULATORY_CARE_PROVIDER_SITE_OTHER): Payer: Medicare Other | Admitting: Gastroenterology

## 2013-01-04 ENCOUNTER — Encounter: Payer: Self-pay | Admitting: Gastroenterology

## 2013-01-04 VITALS — BP 124/74 | HR 78 | Ht 63.5 in | Wt 279.0 lb

## 2013-01-04 DIAGNOSIS — K219 Gastro-esophageal reflux disease without esophagitis: Secondary | ICD-10-CM

## 2013-01-04 DIAGNOSIS — R197 Diarrhea, unspecified: Secondary | ICD-10-CM | POA: Insufficient documentation

## 2013-01-04 DIAGNOSIS — R110 Nausea: Secondary | ICD-10-CM

## 2013-01-04 DIAGNOSIS — R1011 Right upper quadrant pain: Secondary | ICD-10-CM | POA: Diagnosis not present

## 2013-01-04 DIAGNOSIS — R490 Dysphonia: Secondary | ICD-10-CM | POA: Diagnosis not present

## 2013-01-04 DIAGNOSIS — R109 Unspecified abdominal pain: Secondary | ICD-10-CM | POA: Diagnosis not present

## 2013-01-04 DIAGNOSIS — R634 Abnormal weight loss: Secondary | ICD-10-CM | POA: Diagnosis not present

## 2013-01-04 LAB — COMPREHENSIVE METABOLIC PANEL
BUN: 18 mg/dL (ref 6–23)
CO2: 27 mEq/L (ref 19–32)
Creatinine, Ser: 1.1 mg/dL (ref 0.4–1.2)
GFR: 55.66 mL/min — ABNORMAL LOW (ref >60.00)
Glucose, Bld: 108 mg/dL — ABNORMAL HIGH (ref 70–99)
Total Bilirubin: 0.3 mg/dL (ref 0.3–1.2)

## 2013-01-04 LAB — CBC WITH DIFFERENTIAL/PLATELET
Basophils Relative: 0.5 % (ref 0.0–3.0)
Eosinophils Absolute: 0.1 10*3/uL (ref 0.0–0.7)
Eosinophils Relative: 2.1 % (ref 0.0–5.0)
HCT: 34.9 % — ABNORMAL LOW (ref 36.0–46.0)
Hemoglobin: 11.9 g/dL — ABNORMAL LOW (ref 12.0–15.0)
Lymphs Abs: 2 10*3/uL (ref 0.7–4.0)
MCHC: 34 g/dL (ref 30.0–36.0)
MCV: 83.3 fl (ref 78.0–100.0)
Monocytes Absolute: 0.3 10*3/uL (ref 0.1–1.0)
Neutro Abs: 3.2 10*3/uL (ref 1.4–7.7)
RBC: 4.19 Mil/uL (ref 3.87–5.11)
WBC: 5.7 10*3/uL (ref 4.5–10.5)

## 2013-01-04 MED ORDER — DICYCLOMINE HCL 20 MG PO TABS: ORAL_TABLET | ORAL | Status: DC

## 2013-01-04 NOTE — Progress Notes (Signed)
01/04/2013 [name] [id number] 25-May-1945   HISTORY OF PRESENT ILLNESS:  This is a 67 year old female who was a patient of Dr. Jennye Boroughs, but has transferred her care to our practice and was accepted by Dr. Arlyce Dice. She comes in today with several complaints however, her main complaint is of right upper quadrant abdominal pain. She states that the right upper quadrant abdominal pain came on suddenly in August.  It stays in her right upper quadrant and sometimes goes around to her right side. The pain is constant on a daily basis but sometimes worse than others. She cannot identify certain foods or other triggers that seem to make it worse. States that if gets a severe at times that she has to take pain medication for it. She is status post cholecystectomy. She just had an EGD in August of this year which showed a large hiatal hernia and an antral polyp, which was removed and found to be benign antral-type mucosa with minimal chronic inflammation, negative for H. pylori. She had a CT scan of the abdomen with contrast in September which showed no acute findings; just confirmed moderate hiatal hernia, and transverse colon and descending colon diverticulosis without evidence of diverticulitis. Last colonoscopy was in 2009 and she was only found have diverticulosis at that time. Colonoscopy in 2004 only showed diverticulosis and internal hemorrhoids as well. She also complains of hoarseness in her voice on and off for the past couple of years. She has seen an ENT physician who told her her vocal cords looked inflamed, most likely secondary to reflux. She has been on several reflux regimens over the years including her current regimen of Protonix 40 mg twice daily and Zantac 300 mg daily. Despite acid suppression she continues to have a hoarse voice. She states that on days that her right upper quadrant abdominal pain is worse then her voice seems to be more hoarse as well. She also complains of nausea,  bloating, and loss of appetite. She states that despite having no appetite she continues to gain weight. She does have intermittent vomiting and does vomited undigested food on occasion if it occurs immediately after eating. In regards to the pain, it seems to be worse if she does not eat then gets better for a little while when she eats, but then returns again shortly after. She takes a daily probiotic.    Past Medical History  Diagnosis Date  . Asthma   . Arthritis   . Hypertension   . Hypercholesteremia   . Unstable angina 09/09/2011  . HTN (hypertension) 09/09/2011  . Hyperlipemia 09/09/2011  . GERD (gastroesophageal reflux disease) 09/09/2011  . Anxiety   . Fatty liver 07/28/00  . Hiatal hernia   . Diverticulosis   . Antral polyp     benign  . IBS (irritable bowel syndrome)   . Obesity   . Internal hemorrhoid    Past Surgical History  Procedure Laterality Date  . Back surgery  [phone]    3 ruptured discs  . Total knee arthroplasty Bilateral   . Cholecystectomy    . Abdominal hysterectomy  1988  . Tumor excision  1964    rt. leg fatty tumor  . Shoulder surgery Right 1987  . Wrist surgery  1997, 2009   . Elbow surgery Left     removed bone chip  . Foot osteotomy w/ plantar fascia release Left   . Ankle reconstruction Right     x 3  . Hip ilioban release Right 1997  .  Ankle fusion Right     x 2  . Total ankle replacement Right 2012    reports that she has been passively smoking.  She has never used smokeless tobacco. She reports that she does not drink alcohol or use illicit drugs. family history includes Arthritis in her mother; Diabetes in her mother; Esophageal cancer (age of onset: 89) in her father; Hypertension in her mother. There is no history of Coronary artery disease. No Known Allergies    Outpatient Encounter Prescriptions as of 01/04/2013  Medication Sig Dispense Refill  . acetaminophen-codeine (TYLENOL #3) 300-30 MG per tablet Take 1 tablet by  mouth every 4 (four) hours as needed for pain.      [name] albuterol (PROVENTIL HFA;VENTOLIN HFA) 108 (90 BASE) MCG/ACT inhaler Inhale 2 puffs into the lungs every 4 (four) hours as needed. Shortness of breath      . cetirizine (ZYRTEC) 10 MG tablet Take 10 mg by mouth daily as needed. allergies      . clorazepate (TRANXENE) 7.5 MG tablet Take 7.5 mg by mouth as needed.      . fish oil-omega-3 fatty acids 1000 MG capsule Take 2 g by mouth daily.      . hydrochlorothiazide (MICROZIDE) 12.5 MG capsule Take 12.5 mg by mouth daily.      [name] losartan (COZAAR) 50 MG tablet Take 50 mg by mouth daily.      . methocarbamol (ROBAXIN) 750 MG tablet Take 750 mg by mouth 4 (four) times daily.      . pantoprazole (PROTONIX) 40 MG tablet Take 40 mg by mouth 2 (two) times daily.      . Probiotic Product (PROBIOTIC DAILY PO) Take by mouth.      . ranitidine (ZANTAC) 300 MG tablet Take 300 mg by mouth at bedtime.      . simvastatin (ZOCOR) 20 MG tablet Take 20 mg by mouth every evening.      . dicyclomine (BENTYL) 20 MG tablet Take 1 tab twice daily for cramping and spasms  60 tablet  1  . nitroGLYCERIN (NITROSTAT) 0.4 MG SL tablet Place 0.4 mg under the tongue every 5 (five) minutes as needed. Chest pain      . [DISCONTINUED] Azelastine HCl (ASTEPRO NA) As needed      . [DISCONTINUED] celecoxib (CELEBREX) 200 MG capsule Take 200 mg by mouth daily.      . [DISCONTINUED] mometasone (NASONEX) 50 MCG/ACT nasal spray Place 2 sprays into the nose 2 (two) times daily.      . [DISCONTINUED] omeprazole (PRILOSEC) 20 MG capsule Take 20 mg by mouth 2 (two) times daily.       No facility-administered encounter medications on file as of 01/04/2013.     REVIEW OF SYSTEMS  : All other systems reviewed and negative except where noted in the History of Present Illness.   PHYSICAL EXAM: BP 124/74  Pulse 78  Ht 5' 3.5" (1.613 m)  Wt 279 lb (126.554 kg)  BMI 48.64 kg/m2 General: Well developed white female in no acute  distress Head: Normocephalic and atraumatic Eyes:  Sclerae anicteric, conjunctiva pink. Ears: Normal auditory acuity Lungs: Clear throughout to auscultation Heart: Regular rate and rhythm Abdomen: Soft, obese, non-distended.  BS present.  Laparoscopy scars noted.  Mild RUQ TTP without R/R/G. Musculoskeletal: Symmetrical with no gross deformities  Skin: No lesions on visible extremities Extremities: No edema  Neurological: Alert oriented x 4, grossly nonfocal Psychological:  Alert and cooperative. Normal mood and affect  ASSESSMENT  AND PLAN: -This is a 67 year old female with complaints mostly of right upper quadrant abdominal pain, but also with nausea, hoarseness, and intermittent diarrhea as well. She has been given a diagnosis of IBS per Dr. Jennye Boroughs notes. I am unsure at this point what the cause of her abdominal pain is.  ? If this is cramping/spasm secondary to irritable bowel versus symptoms from her hiatal hernia versus scar tissue and adhesions versus musculoskeletal or other causes. I also wonder if she has some gastroparesis that is causing her nausea and hoarseness, which could be from uncontrolled reflux. We will start by scheduling her for a gastric emptying scan. We will check some basic labs including a CBC, CMP and celiac labs. I am going to place her on Bentyl 20 mg twice a day to see if this helps her abdominal pain. She will follow up with Dr. Arlyce Dice in approximately 4 weeks.  ? If she will need a pH study while on her medications to see if she is having adequate acid suppression if GES is negative.

## 2013-01-04 NOTE — Patient Instructions (Signed)
Please go to the basement level to have your labs drawn.   We scheduled the Gastric Emptying Scan at [facility], 1st floor for 01-17-2013. Arrive at 12:45 PM.  Have nothing to eat or drink after 7 Am that day. Do not take the Protonix the day before of the morning of.   We sent a prescription for the Bentyl 20 mg ( dicyclomine ) to [address]   We made you a follow up appointment with Dr. Arlyce Dice  On 11-26-at 9:15 AM..

## 2013-01-04 NOTE — Progress Notes (Signed)
Reviewed and agree with management.  Proceed with gastric emptying scan.  If negative it is probably worthwhile doing a bravo pH study while on PPI therapy. Barbette Hair. Arlyce Dice, M.D., [facility]

## 2013-01-08 ENCOUNTER — Other Ambulatory Visit: Payer: Self-pay | Admitting: *Deleted

## 2013-01-08 MED ORDER — RANITIDINE HCL 300 MG PO TABS: 300.0000 mg | ORAL_TABLET | Freq: Every day | ORAL | Status: DC

## 2013-01-17 ENCOUNTER — Encounter (HOSPITAL_COMMUNITY)
Admission: RE | Admit: 2013-01-17 | Discharge: 2013-01-17 | Disposition: A | Discharge status: Home / Self Care | Payer: Medicare Other | Source: Ambulatory Visit | Attending: Gastroenterology | Admitting: Gastroenterology

## 2013-01-17 ENCOUNTER — Encounter (HOSPITAL_COMMUNITY): Payer: Self-pay

## 2013-01-17 DIAGNOSIS — R197 Diarrhea, unspecified: Secondary | ICD-10-CM | POA: Diagnosis not present

## 2013-01-17 DIAGNOSIS — J383 Other diseases of vocal cords: Secondary | ICD-10-CM | POA: Diagnosis not present

## 2013-01-17 DIAGNOSIS — K219 Gastro-esophageal reflux disease without esophagitis: Secondary | ICD-10-CM | POA: Diagnosis not present

## 2013-01-17 DIAGNOSIS — R110 Nausea: Secondary | ICD-10-CM | POA: Diagnosis not present

## 2013-01-17 MED ORDER — TECHNETIUM TC 99M SULFUR COLLOID
2.0000 | Freq: Once | INTRAVENOUS | Status: AC | PRN
  Administered 2013-01-17: 2 via INTRAVENOUS

## 2013-01-18 ENCOUNTER — Other Ambulatory Visit: Payer: Self-pay | Admitting: *Deleted

## 2013-01-18 MED ORDER — COLESTIPOL HCL 1 G PO TABS: 1.0000 g | ORAL_TABLET | Freq: Two times a day (BID) | ORAL | Status: DC

## 2013-01-18 MED ORDER — CHOLESTYRAMINE 4 G PO PACK: PACK | ORAL | Status: DC

## 2013-01-31 DIAGNOSIS — IMO0002 Reserved for concepts with insufficient information to code with codable children: Secondary | ICD-10-CM | POA: Diagnosis not present

## 2013-01-31 DIAGNOSIS — M5137 Other intervertebral disc degeneration, lumbosacral region: Secondary | ICD-10-CM | POA: Diagnosis not present

## 2013-01-31 DIAGNOSIS — M1990 Unspecified osteoarthritis, unspecified site: Secondary | ICD-10-CM | POA: Diagnosis not present

## 2013-01-31 DIAGNOSIS — M47817 Spondylosis without myelopathy or radiculopathy, lumbosacral region: Secondary | ICD-10-CM | POA: Diagnosis not present

## 2013-02-14 ENCOUNTER — Encounter (HOSPITAL_COMMUNITY): Payer: Self-pay

## 2013-02-14 ENCOUNTER — Ambulatory Visit (INDEPENDENT_AMBULATORY_CARE_PROVIDER_SITE_OTHER): Payer: Medicare Other | Admitting: Gastroenterology

## 2013-02-14 ENCOUNTER — Encounter: Payer: Self-pay | Admitting: Gastroenterology

## 2013-02-14 ENCOUNTER — Ambulatory Visit (HOSPITAL_COMMUNITY): Admit: 2013-02-14 | Payer: Self-pay | Admitting: Gastroenterology

## 2013-02-14 ENCOUNTER — Ambulatory Visit: Payer: Medicare Other | Admitting: Gastroenterology

## 2013-02-14 VITALS — BP 106/74 | HR 104 | Ht 63.25 in | Wt 280.1 lb

## 2013-02-14 DIAGNOSIS — K219 Gastro-esophageal reflux disease without esophagitis: Secondary | ICD-10-CM | POA: Diagnosis not present

## 2013-02-14 DIAGNOSIS — R1011 Right upper quadrant pain: Secondary | ICD-10-CM

## 2013-02-14 DIAGNOSIS — M25579 Pain in unspecified ankle and joints of unspecified foot: Secondary | ICD-10-CM | POA: Diagnosis not present

## 2013-02-14 DIAGNOSIS — Z96669 Presence of unspecified artificial ankle joint: Secondary | ICD-10-CM | POA: Diagnosis not present

## 2013-02-14 DIAGNOSIS — M79609 Pain in unspecified limb: Secondary | ICD-10-CM | POA: Diagnosis not present

## 2013-02-14 SURGERY — PH MONITORING, ESOPHAGUS, WIRELESS
Anesthesia: Moderate Sedation

## 2013-02-14 NOTE — Assessment & Plan Note (Signed)
Etiology is unclear but she clearly has had an excellent response to Bentyl.  Plan to continue with the same but the patient was instructed to use it when necessary only   

## 2013-02-14 NOTE — Progress Notes (Signed)
History of Present Illness:  Patient has returned for followup of abdominal pain and reflux.  Right upper quadrant pain has entirely subsided.  She suffers from intermittent hoarseness which has been attributed to reflux.  She remains on ranitidine and Protonix.  Despite this regimen she intermittently has stomach upset that's associated with severe hoarseness or raspiness of her throat.  Recent gastric emptying scan was normal.    Review of Systems: Pertinent positive and negative review of systems were noted in the above HPI section. All other review of systems were otherwise negative.    Current Medications, Allergies, Past Medical History, Past Surgical History, Family History and Social History were reviewed in [employer] electronic medical record  Vital signs were reviewed in today's medical record. Physical Exam: General: Well developed , well nourished, no acute distress

## 2013-02-14 NOTE — Addendum Note (Signed)
Addended by: Marlowe Kays on: 02/14/2013 12:05 PM   Modules accepted: Orders

## 2013-02-14 NOTE — Assessment & Plan Note (Signed)
Symptoms are moderately well controlled with ranitidine and Protonix.  She has ongoing issues with vocal cord dysfunction which may be due to reflux.  I think it's prudent to explore this further since, if she has ongoing reflux causing vocal cord dysfunction, then it may be worthwhile doing an antireflux procedure.  Accordingly, she'll be scheduled for a 48 hour bravo pH study while taking her medications.

## 2013-02-14 NOTE — Patient Instructions (Addendum)
Use Colestipol twice a day Discontinue Zantac Use Bentyl as needed

## 2013-02-19 ENCOUNTER — Encounter (HOSPITAL_COMMUNITY): Payer: Self-pay | Admitting: *Deleted

## 2013-02-20 ENCOUNTER — Encounter (HOSPITAL_COMMUNITY): Payer: Self-pay | Admitting: Pharmacy Technician

## 2013-02-22 DIAGNOSIS — R609 Edema, unspecified: Secondary | ICD-10-CM | POA: Diagnosis not present

## 2013-02-22 DIAGNOSIS — Z96669 Presence of unspecified artificial ankle joint: Secondary | ICD-10-CM | POA: Diagnosis not present

## 2013-02-22 DIAGNOSIS — M79609 Pain in unspecified limb: Secondary | ICD-10-CM | POA: Diagnosis not present

## 2013-02-22 DIAGNOSIS — M25579 Pain in unspecified ankle and joints of unspecified foot: Secondary | ICD-10-CM | POA: Diagnosis not present

## 2013-02-22 DIAGNOSIS — M19079 Primary osteoarthritis, unspecified ankle and foot: Secondary | ICD-10-CM | POA: Diagnosis not present

## 2013-03-02 DIAGNOSIS — E559 Vitamin D deficiency, unspecified: Secondary | ICD-10-CM | POA: Diagnosis not present

## 2013-03-02 DIAGNOSIS — I10 Essential (primary) hypertension: Secondary | ICD-10-CM | POA: Diagnosis not present

## 2013-03-02 DIAGNOSIS — R7301 Impaired fasting glucose: Secondary | ICD-10-CM | POA: Diagnosis not present

## 2013-03-02 DIAGNOSIS — E785 Hyperlipidemia, unspecified: Secondary | ICD-10-CM | POA: Diagnosis not present

## 2013-03-06 ENCOUNTER — Ambulatory Visit (HOSPITAL_COMMUNITY)
Admission: RE | Admit: 2013-03-06 | Discharge: 2013-03-06 | Disposition: A | Discharge status: Home / Self Care | Payer: Medicare Other | Source: Ambulatory Visit | Attending: Gastroenterology | Admitting: Gastroenterology

## 2013-03-06 ENCOUNTER — Ambulatory Visit (HOSPITAL_COMMUNITY): Payer: Medicare Other | Admitting: Anesthesiology

## 2013-03-06 ENCOUNTER — Encounter (HOSPITAL_COMMUNITY)
Admission: RE | Disposition: A | Discharge status: Home / Self Care | Payer: Self-pay | Source: Ambulatory Visit | Attending: Gastroenterology

## 2013-03-06 ENCOUNTER — Encounter (HOSPITAL_COMMUNITY): Payer: Self-pay | Admitting: *Deleted

## 2013-03-06 ENCOUNTER — Encounter (HOSPITAL_COMMUNITY): Payer: Medicare Other | Admitting: Anesthesiology

## 2013-03-06 DIAGNOSIS — J449 Chronic obstructive pulmonary disease, unspecified: Secondary | ICD-10-CM | POA: Insufficient documentation

## 2013-03-06 DIAGNOSIS — Z79899 Other long term (current) drug therapy: Secondary | ICD-10-CM | POA: Insufficient documentation

## 2013-03-06 DIAGNOSIS — I10 Essential (primary) hypertension: Secondary | ICD-10-CM | POA: Insufficient documentation

## 2013-03-06 DIAGNOSIS — E669 Obesity, unspecified: Secondary | ICD-10-CM | POA: Insufficient documentation

## 2013-03-06 DIAGNOSIS — R1011 Right upper quadrant pain: Secondary | ICD-10-CM

## 2013-03-06 DIAGNOSIS — J4489 Other specified chronic obstructive pulmonary disease: Secondary | ICD-10-CM | POA: Insufficient documentation

## 2013-03-06 DIAGNOSIS — R490 Dysphonia: Secondary | ICD-10-CM | POA: Diagnosis not present

## 2013-03-06 DIAGNOSIS — K219 Gastro-esophageal reflux disease without esophagitis: Secondary | ICD-10-CM | POA: Diagnosis not present

## 2013-03-06 DIAGNOSIS — R1310 Dysphagia, unspecified: Secondary | ICD-10-CM | POA: Diagnosis not present

## 2013-03-06 HISTORY — DX: Nausea with vomiting, unspecified: Z98.890

## 2013-03-06 HISTORY — DX: Other diseases of vocal cords: J38.3

## 2013-03-06 HISTORY — DX: Shortness of breath: R06.02

## 2013-03-06 HISTORY — DX: Nausea with vomiting, unspecified: R11.2

## 2013-03-06 HISTORY — PX: BRAVO PH STUDY: SHX5421

## 2013-03-06 HISTORY — PX: ESOPHAGOGASTRODUODENOSCOPY: SHX5428

## 2013-03-06 SURGERY — EGD (ESOPHAGOGASTRODUODENOSCOPY)
Anesthesia: Monitor Anesthesia Care

## 2013-03-06 MED ORDER — MIDAZOLAM HCL 10 MG/2ML IJ SOLN
INTRAMUSCULAR | Status: AC
  Filled 2013-03-06: qty 4

## 2013-03-06 MED ORDER — PROPOFOL 10 MG/ML IV BOLUS
INTRAVENOUS | Status: AC
  Filled 2013-03-06: qty 20

## 2013-03-06 MED ORDER — PROPOFOL INFUSION 10 MG/ML OPTIME
INTRAVENOUS | Status: DC | PRN
  Administered 2013-03-06: 50 ug/kg/min via INTRAVENOUS

## 2013-03-06 MED ORDER — MIDAZOLAM HCL 5 MG/5ML IJ SOLN
INTRAMUSCULAR | Status: DC | PRN
  Administered 2013-03-06: 2 mg via INTRAVENOUS

## 2013-03-06 MED ORDER — LACTATED RINGERS IV SOLN
INTRAVENOUS | Status: DC | PRN
  Administered 2013-03-06: 11:00:00 via INTRAVENOUS

## 2013-03-06 MED ORDER — GLYCOPYRROLATE 0.2 MG/ML IJ SOLN
INTRAMUSCULAR | Status: AC
  Filled 2013-03-06: qty 1

## 2013-03-06 MED ORDER — SODIUM CHLORIDE 0.9 % IV SOLN: INTRAVENOUS | Status: DC

## 2013-03-06 MED ORDER — KETAMINE HCL 50 MG/ML IJ SOLN
INTRAMUSCULAR | Status: AC
  Filled 2013-03-06: qty 10

## 2013-03-06 MED ORDER — KETAMINE HCL 50 MG/ML IJ SOLN
INTRAMUSCULAR | Status: DC | PRN
  Administered 2013-03-06: 25 mg via INTRAMUSCULAR

## 2013-03-06 MED ORDER — FENTANYL CITRATE 0.05 MG/ML IJ SOLN
INTRAMUSCULAR | Status: AC
  Filled 2013-03-06: qty 4

## 2013-03-06 MED ORDER — MIDAZOLAM HCL 2 MG/2ML IJ SOLN
INTRAMUSCULAR | Status: AC
  Filled 2013-03-06: qty 2

## 2013-03-06 NOTE — Anesthesia Preprocedure Evaluation (Addendum)
Anesthesia Evaluation  Patient identified by MRN, date of birth, ID band Patient awake    Reviewed: Allergy & Precautions, H&P , NPO status , Patient's Chart, lab work & pertinent test results  History of Anesthesia Complications (+) PONV and history of anesthetic complications  Airway Mallampati: II TM Distance: >3 FB Neck ROM: Full    Dental no notable dental hx.    Pulmonary shortness of breath, asthma , COPD COPD inhaler,  breath sounds clear to auscultation  Pulmonary exam normal       Cardiovascular Exercise Tolerance: Good hypertension, Pt. on medications + angina Rhythm:Regular Rate:Normal  Denies recent chest pain. No recent SOB.   Neuro/Psych Anxiety  Neuromuscular disease    GI/Hepatic Neg liver ROS, hiatal hernia, GERD-  Medicated,  Endo/Other  negative endocrine ROS  Renal/GU negative Renal ROS  negative genitourinary   Musculoskeletal negative musculoskeletal ROS (+)   Abdominal (+) + obese,   Peds negative pediatric ROS (+)  Hematology negative hematology ROS (+)   Anesthesia Other Findings   Reproductive/Obstetrics negative OB ROS                          Anesthesia Physical Anesthesia Plan  ASA: III  Anesthesia Plan: MAC   Post-op Pain Management:    Induction: Intravenous  Airway Management Planned:   Additional Equipment:   Intra-op Plan:   Post-operative Plan:   Informed Consent: I have reviewed the patients History and Physical, chart, labs and discussed the procedure including the risks, benefits and alternatives for the proposed anesthesia with the patient or authorized representative who has indicated his/her understanding and acceptance.   Dental advisory given  Plan Discussed with: CRNA  Anesthesia Plan Comments: (H/O vocal cord dysplasia. Bad experience in the past with oral anesthetic. Her throat will not be anesthetized today per Dr. Arlyce Dice.)        Anesthesia Quick Evaluation

## 2013-03-06 NOTE — Anesthesia Postprocedure Evaluation (Signed)
  Anesthesia Post-op Note  Patient: [name]  Procedure(s) Performed: Procedure(s) (LRB): ESOPHAGOGASTRODUODENOSCOPY (EGD) ([name]) BRAVO PH STUDY ([name])  Patient Location: PACU  Anesthesia Type: MAC  Level of Consciousness: awake and alert   Airway and Oxygen Therapy: Patient Spontanous Breathing  Post-op Pain: mild  Post-op Assessment: Post-op Vital signs reviewed, Patient's Cardiovascular Status Stable, Respiratory Function Stable, Patent Airway and No signs of Nausea or vomiting  Last Vitals:  Filed Vitals:   03/06/13 1210  BP: 160/97  Pulse:   Temp:   Resp: 35    Post-op Vital Signs: stable   Complications: No apparent anesthesia complications

## 2013-03-06 NOTE — Op Note (Signed)
[facility] [address] [place], [address]   ENDOSCOPY PROCEDURE REPORT  PATIENT: [name], [name]  MR#: [id number] BIRTHDATE: 10/27/1945 , 67  yrs. old GENDER: Female ENDOSCOPIST: Louis Meckel, MD REFERRED BY:  Kari Baars, M.D. PROCEDURE DATE:  03/06/2013 PROCEDURE:  EGD w/ Bravo capsule placement ASA CLASS:     Class II INDICATIONS:  Therapy of esophageal reflux.   chronic hoarseness. MEDICATIONS: MAC sedation, administered by CRNA TOPICAL ANESTHETIC:  DESCRIPTION OF PROCEDURE: After the risks benefits and alternatives of the procedure were thoroughly explained, informed consent was obtained.  The    endoscope was introduced through the mouth and advanced to the third portion of the duodenum. Without limitations. The instrument was slowly withdrawn as the mucosa was fully examined.      The upper, middle and distal third of the esophagus were carefully inspected and no abnormalities were noted.  The z-line was well seen at the [facility] which was 40 cm from the incisors. The endoscope was pushed into the fundus which was normal including a retroflexed view.  The antrum, gastric body, first and second part of the duodenum were unremarkable.  Retroflexed views revealed no abnormalities.     The scope was then withdrawn from the patient and the procedure completed.  A probable 48 hour pH probe was placed at 34 cm from the incisors.  COMPLICATIONS: There were no complications. ENDOSCOPIC IMPRESSION: Normal EGD - status post bravo pH probe placement  RECOMMENDATIONS: 1.  continue PPI therapy 2.  office visit 2-3 weeks REPEAT EXAM:  eSigned:  Louis Meckel, MD 03/06/2013 11:44 AM   CC:

## 2013-03-06 NOTE — H&P (View-Only) (Signed)
History of Present Illness:  Patient has returned for followup of abdominal pain and reflux.  Right upper quadrant pain has entirely subsided.  She suffers from intermittent hoarseness which has been attributed to reflux.  She remains on ranitidine and Protonix.  Despite this regimen she intermittently has stomach upset that's associated with severe hoarseness or raspiness of her throat.  Recent gastric emptying scan was normal.    Review of Systems: Pertinent positive and negative review of systems were noted in the above HPI section. All other review of systems were otherwise negative.    Current Medications, Allergies, Past Medical History, Past Surgical History, Family History and Social History were reviewed in [facility] electronic medical record  Vital signs were reviewed in today's medical record. Physical Exam: General: Well developed , well nourished, no acute distress     

## 2013-03-06 NOTE — Transfer of Care (Signed)
Immediate Anesthesia Transfer of Care Note  Patient: [name]  Procedure(s) Performed: Procedure(s): ESOPHAGOGASTRODUODENOSCOPY (EGD) ([name]) BRAVO PH STUDY ([name])  Patient Location: PACU  Anesthesia Type:MAC  Level of Consciousness: sedated  Airway & Oxygen Therapy: Patient Spontanous Breathing  Post-op Assessment: Report given to PACU RN and Post -op Vital signs reviewed and stable  Post vital signs: Reviewed and stable  Complications: No apparent anesthesia complications

## 2013-03-06 NOTE — Interval H&P Note (Signed)
History and Physical Interval Note:  03/06/2013 11:20 AM  Byrd Hesselbach  has presented today for surgery, with the diagnosis of Dysphagia [787.20]  The various methods of treatment have been discussed with the patient and family. After consideration of risks, benefits and other options for treatment, the patient has consented to  Procedure(s): ESOPHAGOGASTRODUODENOSCOPY (EGD) ([name]) BRAVO PH STUDY ([name]) as a surgical intervention .  The patient's history has been reviewed, patient examined, no change in status, stable for surgery.  I have reviewed the patient's chart and labs.  Questions were answered to the patient's satisfaction.     The recent H&P (dated **02/14/13*) was reviewed, the patient was examined and there is no change in the patients condition since that H&P was completed.   Melvia Heaps  03/06/2013, 11:20 AM   Melvia Heaps

## 2013-03-07 ENCOUNTER — Encounter (HOSPITAL_COMMUNITY): Payer: Self-pay | Admitting: Gastroenterology

## 2013-03-07 DIAGNOSIS — Z23 Encounter for immunization: Secondary | ICD-10-CM | POA: Diagnosis not present

## 2013-03-07 DIAGNOSIS — I10 Essential (primary) hypertension: Secondary | ICD-10-CM | POA: Diagnosis not present

## 2013-03-07 DIAGNOSIS — E785 Hyperlipidemia, unspecified: Secondary | ICD-10-CM | POA: Diagnosis not present

## 2013-03-07 DIAGNOSIS — J45909 Unspecified asthma, uncomplicated: Secondary | ICD-10-CM | POA: Diagnosis not present

## 2013-03-07 DIAGNOSIS — K449 Diaphragmatic hernia without obstruction or gangrene: Secondary | ICD-10-CM | POA: Diagnosis not present

## 2013-03-07 DIAGNOSIS — R7301 Impaired fasting glucose: Secondary | ICD-10-CM | POA: Diagnosis not present

## 2013-03-07 DIAGNOSIS — Z0000 Encounter for general adult medical examination without abnormal findings: Secondary | ICD-10-CM | POA: Diagnosis not present

## 2013-03-07 DIAGNOSIS — E669 Obesity, unspecified: Secondary | ICD-10-CM | POA: Diagnosis not present

## 2013-03-07 DIAGNOSIS — F341 Dysthymic disorder: Secondary | ICD-10-CM | POA: Diagnosis not present

## 2013-03-19 DIAGNOSIS — Z1231 Encounter for screening mammogram for malignant neoplasm of breast: Secondary | ICD-10-CM | POA: Diagnosis not present

## 2013-03-27 DIAGNOSIS — N6009 Solitary cyst of unspecified breast: Secondary | ICD-10-CM | POA: Diagnosis not present

## 2013-03-28 NOTE — Procedures (Signed)
[facility]                               PROCEDURE NOTE  NAME:[name], Theresa ODONNELL                    MRN:          [id number] DATE:01/04/2013                            DOB:          1945-06-09   This is a 48-hour BRAVO pH study.  48-hour BRAVO pH study was done while the patient took her PPI therapy, and ranitidine.  1. On day 1, there were 5 episodes of reflux.  The total percent of     time in reflux was 0.5%.  DeMeester score for day 1 was 1.8 with     normal less than or equal to 14.72. 2. On day 2, there were only 2     episodes of reflux.  DeMeester score was 0.7.  The findings on 48-hour pH study  conclusively do not demonstrate significant acid reflux.  RECOMMENDATIONS:  Continue current medications.    Sandy Salaam. Deatra Ina, MD,FACG    RDK/MedQ  DD: 03/27/2013  DT: 03/28/2013  Job #: [id number]

## 2013-04-05 ENCOUNTER — Ambulatory Visit (INDEPENDENT_AMBULATORY_CARE_PROVIDER_SITE_OTHER): Payer: Medicare Other | Admitting: Gastroenterology

## 2013-04-05 ENCOUNTER — Encounter: Payer: Self-pay | Admitting: Gastroenterology

## 2013-04-05 VITALS — BP 140/90 | HR 108 | Ht 63.5 in | Wt 277.0 lb

## 2013-04-05 DIAGNOSIS — K219 Gastro-esophageal reflux disease without esophagitis: Secondary | ICD-10-CM | POA: Diagnosis not present

## 2013-04-05 DIAGNOSIS — R1319 Other dysphagia: Secondary | ICD-10-CM

## 2013-04-05 DIAGNOSIS — R1011 Right upper quadrant pain: Secondary | ICD-10-CM | POA: Diagnosis not present

## 2013-04-05 MED ORDER — SUCRALFATE 1 G PO TABS: 1.0000 g | ORAL_TABLET | Freq: Three times a day (TID) | ORAL | Status: DC

## 2013-04-05 MED ORDER — PANTOPRAZOLE SODIUM 40 MG PO TBEC: 40.0000 mg | DELAYED_RELEASE_TABLET | Freq: Two times a day (BID) | ORAL | Status: DC

## 2013-04-05 MED ORDER — SUCRALFATE 1 GM/10ML PO SUSP: 1.0000 g | Freq: Four times a day (QID) | ORAL | Status: DC

## 2013-04-05 NOTE — Assessment & Plan Note (Signed)
Etiology is unclear but she clearly has had an excellent response to Bentyl.  Plan to continue with the same but the patient was instructed to use it when necessary only

## 2013-04-05 NOTE — Patient Instructions (Addendum)
You have been scheduled for a Barium Esophogram at [facility] (1st floor of the hospital) on __1/22/2015______ at __9:30am_________. Please arrive 15 minutes prior to your appointment for registration. Make certain not to have anything to eat or drink 6 hours prior to your test. If you need to reschedule for any reason, please contact radiology at [phone] to do so. __________________________________________________________________ A barium swallow is an examination that concentrates on views of the esophagus. This tends to be a double contrast exam (barium and two liquids which, when combined, create a gas to distend the wall of the oesophagus) or single contrast (non-ionic iodine based). The study is usually tailored to your symptoms so a good history is essential. Attention is paid during the study to the form, structure and configuration of the esophagus, looking for functional disorders (such as aspiration, dysphagia, achalasia, motility and reflux) EXAMINATION You may be asked to change into a gown, depending on the type of swallow being performed. A radiologist and radiographer will perform the procedure. The radiologist will advise you of the type of contrast selected for your procedure and direct you during the exam. You will be asked to stand, sit or lie in several different positions and to hold a small amount of fluid in your mouth before being asked to swallow while the imaging is performed .In some instances you may be asked to swallow barium coated marshmallows to assess the motility of a solid food bolus. The exam can be recorded as a digital or video fluoroscopy procedure. POST PROCEDURE It will take 1-2 days for the barium to pass through your system. To facilitate this, it is important, unless otherwise directed, to increase your fluids for the next 24-48hrs and to resume your normal diet.  This test typically takes about 30 minutes to  perform. __________________________________________________________________________________    Dennis Bast stated you could not take a liquid carafate, so I resent in carafate in a tablet per your request

## 2013-04-05 NOTE — Assessment & Plan Note (Signed)
Patient remains symptomatic despite not having significant acid reflux.  She claims hoarseness is worse when she has significant reflux characterized by fullness in her chest and throat.  Recommendations #1 barium swallow #2 trial of Carafate slurry 1 g 4 times a day

## 2013-04-05 NOTE — Progress Notes (Signed)
          History of Present Illness:  [name] has returned following 48 hour bravo pH study.  This did not demonstrate significant acid reflux.  Nonetheless, she still suffers from regurgitation of gastric contents, especially at night.  She claims that, when this occurs, hoarseness is worse.  Prior gastric emptying scan was normal.  Occasional right upper quadrant pain response to Bentyl.    Review of Systems: Pertinent positive and negative review of systems were noted in the above HPI section. All other review of systems were otherwise negative.    Current Medications, Allergies, Past Medical History, Past Surgical History, Family History and Social History were reviewed in [facility] record  Vital signs were reviewed in today's medical record. Physical Exam: General: Well developed , well nourished, no acute distress   See Assessment and Plan under Problem List

## 2013-04-12 ENCOUNTER — Ambulatory Visit (HOSPITAL_COMMUNITY): Payer: Medicare Other

## 2013-04-12 ENCOUNTER — Encounter: Payer: Self-pay | Admitting: Gastroenterology

## 2013-05-11 ENCOUNTER — Ambulatory Visit (HOSPITAL_COMMUNITY)
Admission: RE | Admit: 2013-05-11 | Discharge: 2013-05-11 | Disposition: A | Discharge status: Home / Self Care | Payer: Medicare Other | Source: Ambulatory Visit | Attending: Gastroenterology | Admitting: Gastroenterology

## 2013-05-11 DIAGNOSIS — R1319 Other dysphagia: Secondary | ICD-10-CM

## 2013-05-11 DIAGNOSIS — K219 Gastro-esophageal reflux disease without esophagitis: Secondary | ICD-10-CM | POA: Diagnosis not present

## 2013-05-11 DIAGNOSIS — K449 Diaphragmatic hernia without obstruction or gangrene: Secondary | ICD-10-CM | POA: Diagnosis not present

## 2013-05-14 DIAGNOSIS — R059 Cough, unspecified: Secondary | ICD-10-CM | POA: Diagnosis not present

## 2013-05-14 DIAGNOSIS — R05 Cough: Secondary | ICD-10-CM | POA: Diagnosis not present

## 2013-05-14 DIAGNOSIS — J111 Influenza due to unidentified influenza virus with other respiratory manifestations: Secondary | ICD-10-CM | POA: Diagnosis not present

## 2013-05-22 DIAGNOSIS — J309 Allergic rhinitis, unspecified: Secondary | ICD-10-CM | POA: Diagnosis not present

## 2013-05-22 DIAGNOSIS — J45901 Unspecified asthma with (acute) exacerbation: Secondary | ICD-10-CM | POA: Diagnosis not present

## 2013-06-01 ENCOUNTER — Encounter: Payer: Self-pay | Admitting: Pulmonary Disease

## 2013-06-01 ENCOUNTER — Encounter: Payer: Self-pay | Admitting: Gastroenterology

## 2013-06-01 ENCOUNTER — Ambulatory Visit (INDEPENDENT_AMBULATORY_CARE_PROVIDER_SITE_OTHER): Payer: Medicare Other | Admitting: Gastroenterology

## 2013-06-01 ENCOUNTER — Ambulatory Visit (INDEPENDENT_AMBULATORY_CARE_PROVIDER_SITE_OTHER): Payer: Medicare Other | Admitting: Pulmonary Disease

## 2013-06-01 ENCOUNTER — Ambulatory Visit (INDEPENDENT_AMBULATORY_CARE_PROVIDER_SITE_OTHER)
Admission: RE | Admit: 2013-06-01 | Discharge: 2013-06-01 | Disposition: A | Discharge status: Home / Self Care | Payer: Medicare Other | Source: Ambulatory Visit | Attending: Pulmonary Disease | Admitting: Pulmonary Disease

## 2013-06-01 ENCOUNTER — Ambulatory Visit: Payer: Medicare Other | Admitting: Gastroenterology

## 2013-06-01 VITALS — BP 110/78 | HR 100 | Temp 97.8°F | Ht 63.25 in | Wt 281.0 lb

## 2013-06-01 VITALS — BP 120/70 | HR 116 | Ht 63.25 in | Wt 279.1 lb

## 2013-06-01 DIAGNOSIS — R05 Cough: Secondary | ICD-10-CM

## 2013-06-01 DIAGNOSIS — R059 Cough, unspecified: Secondary | ICD-10-CM | POA: Diagnosis not present

## 2013-06-01 DIAGNOSIS — J45909 Unspecified asthma, uncomplicated: Secondary | ICD-10-CM | POA: Diagnosis not present

## 2013-06-01 DIAGNOSIS — R053 Chronic cough: Secondary | ICD-10-CM

## 2013-06-01 DIAGNOSIS — R0609 Other forms of dyspnea: Secondary | ICD-10-CM | POA: Diagnosis not present

## 2013-06-01 DIAGNOSIS — R0989 Other specified symptoms and signs involving the circulatory and respiratory systems: Secondary | ICD-10-CM

## 2013-06-01 DIAGNOSIS — K219 Gastro-esophageal reflux disease without esophagitis: Secondary | ICD-10-CM

## 2013-06-01 DIAGNOSIS — R058 Other specified cough: Secondary | ICD-10-CM

## 2013-06-01 DIAGNOSIS — J45998 Other asthma: Secondary | ICD-10-CM

## 2013-06-01 DIAGNOSIS — R0600 Dyspnea, unspecified: Secondary | ICD-10-CM

## 2013-06-01 MED ORDER — TRIAMCINOLONE ACETONIDE 55 MCG/ACT NA AERO: 2.0000 | INHALATION_SPRAY | Freq: Every day | NASAL | Status: DC

## 2013-06-01 MED ORDER — DM-DOXYLAMINE-ACETAMINOPHEN 15-6.25-325 MG/15ML PO LIQD: 30.0000 mL | Freq: Every evening | ORAL | Status: DC | PRN

## 2013-06-01 MED ORDER — BACLOFEN 10 MG PO TABS: 10.0000 mg | ORAL_TABLET | Freq: Three times a day (TID) | ORAL | Status: DC

## 2013-06-01 NOTE — Patient Instructions (Signed)
Your appointment in pulmonary is scheduled on  Today at 1:45pm  Follow up in one month

## 2013-06-01 NOTE — Assessment & Plan Note (Signed)
The patient's continued hoarseness with subjective regurgitation, and possible asthma could be related to non-acid reflux.  PH study only measures acid reflux. If her symptoms are reflective of continued reflux and she fails medical therapy she might be a candidate for a fundoplication, provided that we can demonstrate significant nonreflux.  Recommendations #1 trial of baclofen #2 if symptoms do not improve with baclofen I would consider a formal impedance study with plethysmography to determine whether she has significant non-acid reflux.  Should this be the case I would consider fundoplication.

## 2013-06-01 NOTE — Progress Notes (Signed)
Chief Complaint  Patient presents with  . Shortness of Breath    Breathing is not doing well. Reports SOB, wheezing, coughing and chest tightness.     History of Present Illness: [name] is a 68 y.o. female never smoker with dyspnea.  I last saw her in August 2013.  At that time her dyspnea was felt to be related to vocal cord dysfunction, asthma, and obesity with deconditioning.  She has been followed by Dr. Neldon Mc with allergy medicine.  There was concern she had GERD as cause of her vocal cord dysfunction.  She was [facility] at [facility], and had upper endoscopy >> she was told this was normal except for small nodule.  She has notice recurrence of hoarseness.  She had a bad spell with her stomach in July 2015, and then has noticed trouble with her voice and throat since.  With this she has noticed more trouble breathing.  She reports having the flu 3 weeks ago, and had temperature of 102F then.  There was concern this triggered flare of her asthma, and she was started on symbicort.  She saw Dr. Neldon Mc last week, was given a steroid shot and changed to Qvar.  She was seen by Dr. Deatra Ina with GI, and is being assessed for reflux as a contributor to her upper airway symptoms.  TESTS: CT chest 10/07/11 >> mild mosaic attenuation, coronary atherosclerosis  [name]  has a past medical history of Asthma; Arthritis; Hypertension; Hypercholesteremia; Unstable angina (09/09/2011); HTN (hypertension) (09/09/2011); Hyperlipemia (09/09/2011); GERD (gastroesophageal reflux disease) (09/09/2011); Anxiety; Hiatal hernia; Diverticulosis; Antral polyp; IBS (irritable bowel syndrome); Obesity; Internal hemorrhoid; Vocal cord dysfunction (2013); PONV (postoperative nausea and vomiting); COPD (chronic obstructive pulmonary disease); and Shortness of breath.  [name]  has past surgical history that includes Total knee arthroplasty (Bilateral); Cholecystectomy; Abdominal  hysterectomy (1988); Tumor excision (1964); Shoulder surgery (Right, 1987); Wrist surgery (Left, 1997, 2009 ); Elbow surgery (Left); Foot osteotomy w/ plantar fascia release (Left); Ankle reconstruction (Right); hip ilioban release (Right, 1997); Total ankle replacement (Right, 2012); Ankle Fusion (Right); thumb replacement (Bilateral); Back surgery [phone]); lower back (sept 2014); Nasal sinus surgery (1997); Esophagogastroduodenoscopy ([name], 03/06/2013); and BRAVO ph study ([name], 03/06/2013).  Prior to Admission medications   Medication Sig Start Date End Date Taking? Authorizing Provider  acetaminophen-codeine (TYLENOL #3) 300-30 MG per tablet Take 1 tablet by mouth every 4 (four) hours as needed for moderate pain or severe pain.    Yes Historical Provider, MD  albuterol (PROVENTIL HFA;VENTOLIN HFA) 108 (90 BASE) MCG/ACT inhaler Inhale 2 puffs into the lungs every 4 (four) hours as needed for wheezing or shortness of breath. Shortness of breath   Yes Historical Provider, MD  baclofen (LIORESAL) 10 MG tablet Take 1 tablet (10 mg total) by mouth 3 (three) times daily. 06/01/13  Yes Inda Castle, MD  beclomethasone (QVAR) 40 MCG/ACT inhaler Inhale 2 puffs into the lungs 2 (two) times daily.   Yes Historical Provider, MD  clorazepate (TRANXENE) 7.5 MG tablet Take 7.5 mg by mouth as needed for anxiety.    Yes Historical Provider, MD  diclofenac (VOLTAREN) 75 MG EC tablet Take 75 mg by mouth daily.   Yes Historical Provider, MD  dicyclomine (BENTYL) 20 MG tablet Take 20 mg by mouth 2 (two) times daily as needed for spasms. Take 1 tab twice daily for cramping and spasms 01/04/13  Yes Jessica D. Zehr, PA-C  DULoxetine HCl (CYMBALTA PO) Take 1 tablet by  mouth daily with supper.   Yes Historical Provider, MD  hydrochlorothiazide (MICROZIDE) 12.5 MG capsule Take 12.5 mg by mouth daily with breakfast.    Yes Historical Provider, MD  losartan (COZAAR) 50 MG tablet Take 50 mg by mouth 2 (two) times daily.     Yes Historical Provider, MD  methocarbamol (ROBAXIN) 750 MG tablet Take 750 mg by mouth every 6 (six) hours as needed for muscle spasms.   Yes Historical Provider, MD  pantoprazole (PROTONIX) 40 MG tablet Take 1 tablet (40 mg total) by mouth 2 (two) times daily. 04/05/13  Yes Inda Castle, MD  simvastatin (ZOCOR) 20 MG tablet Take 20 mg by mouth every evening.   Yes Historical Provider, MD  sucralfate (CARAFATE) 1 G tablet Take 1 tablet (1 g total) by mouth 4 (four) times daily -  with meals and at bedtime. 04/05/13  Yes Inda Castle, MD    No Known Allergies   Physical Exam:  General - No distress ENT - No sinus tenderness, no oral exudate, no LAN, wheeze over neck area Cardiac - s1s2 regular, no murmur Chest - No wheeze/rales/dullness Back - No focal tenderness Abd - Soft, non-tender Ext - No edema Neuro - Normal strength Skin - No rashes Psych - normal mood, and behavior   Assessment/Plan:  Chesley Mires, MD [place] Pager:  [phone]

## 2013-06-01 NOTE — Patient Instructions (Signed)
Delsym multi-symptom night time relief 30 ml at night as needed Nasacort two sprays each nostril daily Sip water when you have urge to cough Limit speaking to only when necessary Use sugarless candy to keep your mouth moist Chest xray today Follow up in 2 to 3 weeks

## 2013-06-01 NOTE — Progress Notes (Signed)
          History of Present Illness:  Ms. Faraone has returned for followup of hoarseness and regurgitation.  Barium swallow demonstrated only mild reflux and a small hiatal hernia.  She continues to complain of hoarseness and regurgitation of gastric contents.  She's also having worsening dyspnea on exertion.    Review of Systems: She has muscle cramps in her abdomen and lower extremities.  Pertinent positive and negative review of systems were noted in the above HPI section. All other review of systems were otherwise negative.    Current Medications, Allergies, Past Medical History, Past Surgical History, Family History and Social History were reviewed in [facility] record  Vital signs were reviewed in today's medical record. Physical Exam: General: Well developed , well nourished, no acute distress Skin: anicteric Head: Normocephalic and atraumatic Eyes:  sclerae anicteric, EOMI Ears: Normal auditory acuity Mouth: No deformity or lesions Lungs: Clear throughout to auscultation Heart: Regular rate and rhythm; no murmurs, rubs or bruits Abdomen: Soft, non tender and non distended. No masses, hepatosplenomegaly or hernias noted. Normal Bowel sounds Rectal:deferred Musculoskeletal: Symmetrical with no gross deformities  Pulses:  Normal pulses noted Extremities: No clubbing, cyanosis, edema or deformities noted Neurological: Alert oriented x 4, grossly nonfocal Psychological:  Alert and cooperative. Normal mood and affect  See Assessment and Plan under Problem List

## 2013-06-04 ENCOUNTER — Telehealth: Payer: Self-pay | Admitting: Gastroenterology

## 2013-06-04 ENCOUNTER — Telehealth: Payer: Self-pay | Admitting: Pulmonary Disease

## 2013-06-04 DIAGNOSIS — J383 Other diseases of vocal cords: Secondary | ICD-10-CM

## 2013-06-04 DIAGNOSIS — R05 Cough: Secondary | ICD-10-CM

## 2013-06-04 DIAGNOSIS — R059 Cough, unspecified: Secondary | ICD-10-CM

## 2013-06-04 NOTE — Telephone Encounter (Signed)
Dg Chest 2 View  06/01/2013   CLINICAL DATA:  Cough.  EXAM: CHEST  2 VIEW  COMPARISON:  CT ANGIO CHEST W/CM &/OR WO/CM dated 10/08/2011; DG CHEST 2 VIEW dated 09/07/2011  FINDINGS: The heart size and mediastinal contours are within normal limits. Small hiatal hernia. Both lungs are clear. Degenerative changes thoracic spine. .  IMPRESSION: No active cardiopulmonary disease.  Small hiatal hernia.   Electronically Signed   By: Marcello Moores  Register   On: 06/01/2013 15:31   Please inform pt that CXR showed small hiatal hernia, but otherwise normal.  Okay to place order to refer her to [facility].

## 2013-06-04 NOTE — Telephone Encounter (Signed)
I spoke with patient about results and she verbalized understanding and had no questions. Referral placed. 

## 2013-06-04 NOTE — Telephone Encounter (Signed)
Dr. Deatra Ina please see note below regarding referral. Are you ok with making this referral or do you want this to come from her PCP?

## 2013-06-04 NOTE — Telephone Encounter (Signed)
I called and spoke with pt. Aware will ask VS regarding CXR results. Pt also stated she would like a referral to [facility]. The # is [phone] Please advise VS thanks

## 2013-06-04 NOTE — Telephone Encounter (Signed)
I don't mind making the referral, however, I think we should try baclofen first and, failing that, impedance plethysmography.  I am suspicious that symptoms may be related to reflux. It is up to the patient.

## 2013-06-05 ENCOUNTER — Telehealth: Payer: Self-pay | Admitting: Pulmonary Disease

## 2013-06-05 NOTE — Telephone Encounter (Signed)
Called and spoke with pt. She reports she called [facility] and was advised they have not received order. I can't tell anything has been done with order. Please advise [facility] THANKS

## 2013-06-05 NOTE — Telephone Encounter (Signed)
i have spoke with this pt she is gfoing to see dr Shanon Brow witseel 06/15/13@1 :30 and she is aware  [name]

## 2013-06-06 NOTE — Telephone Encounter (Signed)
Spoke with pt and she states that Dr. Naida Sleight has already taken care of this for her.

## 2013-06-07 DIAGNOSIS — R490 Dysphonia: Secondary | ICD-10-CM | POA: Diagnosis not present

## 2013-06-07 DIAGNOSIS — G479 Sleep disorder, unspecified: Secondary | ICD-10-CM | POA: Diagnosis not present

## 2013-06-07 DIAGNOSIS — J383 Other diseases of vocal cords: Secondary | ICD-10-CM | POA: Diagnosis not present

## 2013-06-11 ENCOUNTER — Telehealth: Payer: Self-pay | Admitting: Pulmonary Disease

## 2013-06-11 NOTE — Assessment & Plan Note (Signed)
I don't think her current symptoms are related to lower airway disease with asthma.  She is to continue Qvar and prn albuterol.

## 2013-06-11 NOTE — Assessment & Plan Note (Addendum)
Likely related to vocal cord dysfunction, deconditioning, and asthma.  Will get chest xray today and call her with results.

## 2013-06-11 NOTE — Telephone Encounter (Signed)
Called and made pt aware of recs. Nothing further needed 

## 2013-06-11 NOTE — Telephone Encounter (Signed)
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  They were also concerned about her sleep apnea as a contributor to her shortness of breath, and this is likely what was meant by "pulmonary work up".  Can discuss in more detail at next ROV.

## 2013-06-11 NOTE — Assessment & Plan Note (Addendum)
Advised her to try nasal irrigation and nasacort on regular basis.  She can use delsym multi-symptom night time relief 30 ml at night as needed.  She is to sip water when she has urge to cough, limit speaking, and use sugarless candy to maintain salivary flow.  She is to f/u with GI for further assessment of reflux.

## 2013-06-11 NOTE — Telephone Encounter (Signed)
Called and spoke with pt. She reports she was seen at [facility] Thursday. Per pt they want her to have a "pulmonary work up".  She asked what this meant and was advised we would know what this means.  [facility] note is in care everywhere. Please advise Dr. Halford Chessman thanks

## 2013-06-12 DIAGNOSIS — H5210 Myopia, unspecified eye: Secondary | ICD-10-CM | POA: Diagnosis not present

## 2013-06-12 DIAGNOSIS — H43819 Vitreous degeneration, unspecified eye: Secondary | ICD-10-CM | POA: Diagnosis not present

## 2013-06-15 DIAGNOSIS — R490 Dysphonia: Secondary | ICD-10-CM | POA: Diagnosis not present

## 2013-06-25 ENCOUNTER — Telehealth: Payer: Self-pay | Admitting: Gastroenterology

## 2013-06-25 NOTE — Telephone Encounter (Signed)
Pt having lots of issues with her stomach, really does not know what to take for her stomach. States she was placed on Baclofen and it has not helped. Pt reports having lots of diarrhea, abdominal cramping and pain. Pt scheduled to see Nicoletta Ba PA tomorrow at 3:30pm. Pt aware of appt.

## 2013-06-26 ENCOUNTER — Ambulatory Visit (INDEPENDENT_AMBULATORY_CARE_PROVIDER_SITE_OTHER): Payer: Medicare Other | Admitting: Physician Assistant

## 2013-06-26 ENCOUNTER — Other Ambulatory Visit (INDEPENDENT_AMBULATORY_CARE_PROVIDER_SITE_OTHER): Payer: Medicare Other

## 2013-06-26 ENCOUNTER — Encounter: Payer: Self-pay | Admitting: Physician Assistant

## 2013-06-26 VITALS — BP 110/90 | HR 74 | Ht 63.25 in | Wt 274.4 lb

## 2013-06-26 DIAGNOSIS — K5730 Diverticulosis of large intestine without perforation or abscess without bleeding: Secondary | ICD-10-CM | POA: Diagnosis not present

## 2013-06-26 DIAGNOSIS — R110 Nausea: Secondary | ICD-10-CM

## 2013-06-26 DIAGNOSIS — R1011 Right upper quadrant pain: Secondary | ICD-10-CM | POA: Diagnosis not present

## 2013-06-26 DIAGNOSIS — R197 Diarrhea, unspecified: Secondary | ICD-10-CM | POA: Diagnosis not present

## 2013-06-26 LAB — CBC WITH DIFFERENTIAL/PLATELET
Basophils Absolute: 0.1 10*3/uL (ref 0.0–0.1)
Basophils Relative: 0.6 % (ref 0.0–3.0)
EOS ABS: 0.1 10*3/uL (ref 0.0–0.7)
EOS PCT: 0.6 % (ref 0.0–5.0)
HEMATOCRIT: 38.9 % (ref 36.0–46.0)
HEMOGLOBIN: 13.1 g/dL (ref 12.0–15.0)
Lymphocytes Relative: 31.8 % (ref 12.0–46.0)
Lymphs Abs: 2.7 10*3/uL (ref 0.7–4.0)
MCHC: 33.6 g/dL (ref 30.0–36.0)
MCV: 85.4 fl (ref 78.0–100.0)
MONO ABS: 0.5 10*3/uL (ref 0.1–1.0)
Monocytes Relative: 6.1 % (ref 3.0–12.0)
NEUTROS ABS: 5.2 10*3/uL (ref 1.4–7.7)
Neutrophils Relative %: 60.9 % (ref 43.0–77.0)
Platelets: 237 10*3/uL (ref 150.0–400.0)
RBC: 4.55 Mil/uL (ref 3.87–5.11)
RDW: 14.9 % — ABNORMAL HIGH (ref 11.5–14.6)
WBC: 8.6 10*3/uL (ref 4.5–10.5)

## 2013-06-26 LAB — BASIC METABOLIC PANEL
BUN: 17 mg/dL (ref 6–23)
CHLORIDE: 100 meq/L (ref 96–112)
CO2: 27 mEq/L (ref 19–32)
CREATININE: 0.9 mg/dL (ref 0.4–1.2)
Calcium: 9.6 mg/dL (ref 8.4–10.5)
GFR: 66.4 mL/min (ref >60.00)
Glucose, Bld: 111 mg/dL — ABNORMAL HIGH (ref 70–99)
Potassium: 4.7 mEq/L (ref 3.5–5.1)
Sodium: 135 mEq/L (ref 135–145)

## 2013-06-26 MED ORDER — TRAMADOL HCL 50 MG PO TABS: 50.0000 mg | ORAL_TABLET | Freq: Four times a day (QID) | ORAL | Status: DC | PRN

## 2013-06-26 MED ORDER — DICYCLOMINE HCL 20 MG PO TABS: ORAL_TABLET | ORAL | Status: DC

## 2013-06-26 MED ORDER — METRONIDAZOLE 250 MG PO TABS: 250.0000 mg | ORAL_TABLET | Freq: Four times a day (QID) | ORAL | Status: AC

## 2013-06-26 MED ORDER — ONDANSETRON HCL 4 MG PO TABS: 4.0000 mg | ORAL_TABLET | Freq: Three times a day (TID) | ORAL | Status: DC | PRN

## 2013-06-26 NOTE — Progress Notes (Signed)
Subjective:    Patient ID: [name], female    DOB: 26-Jan-1946, 68 y.o.   MRN: [id number]  HPI  [name] is a 68 year old white female known to Dr. Deatra Ina. She has history of chronic GERD and has had chronic hoarseness for which she is now been referred to the [facility] voice clinic. She also has diverticular disease, morbid obesity, asthma hypertension and hyperlipidemia. She had colonoscopy in 2009 with Dr. Earlean Shawl  revealing extensive universal diverticulosis Patient had last been seen in mid March and at that time baclofen was added to her regimen to see if this would help with her hoarseness and regurgitation symptoms. She has since been seen in pulmonary. She has been taking the baclofen but does not feel this is making much difference. She comes in today stating that she had been having increased problems with diarrhea over the past couple of weeks and some urgency postprandially. She had taken a Z-Pak in March for respiratory symptoms. Now over the past 5 days she has had horrible problems with right upper quadrant pain cramping and severe diarrhea as well as nausea and vomiting. She says she'll have 2-3 bowel movements after every meal and these have been watery and yellow. She put herself back on dicyclomine twice daily. She's not had any documented fever but says she's been having sweats and generally feels bad. She had 2 days over the weekend at which time she was having nausea, vomiting and diarrhea at the same  time. Now she is able to keep down by mouth but continues with diarrhea. She is having primarily right-sided pain which she describes as crampy and radiating through to her back. She says this is the same pain that she has had in the past "which nobody can figure out". She is status post cholecystectomy.    Review of Systems  Constitutional: Positive for appetite change and fatigue.  HENT: Positive for voice change.   Eyes: Negative.   Respiratory: Negative.   Cardiovascular:  Negative.   Gastrointestinal: Positive for nausea, abdominal pain and diarrhea.  Endocrine: Negative.   Genitourinary: Negative.   Musculoskeletal: Positive for arthralgias.  Allergic/Immunologic: Negative.   Neurological: Negative.   Hematological: Negative.   Psychiatric/Behavioral: Negative.    Outpatient Prescriptions Prior to Visit  Medication Sig Dispense Refill  . acetaminophen-codeine (TYLENOL #3) 300-30 MG per tablet Take 1 tablet by mouth every 4 (four) hours as needed for moderate pain or severe pain.       [name] albuterol (PROVENTIL HFA;VENTOLIN HFA) 108 (90 BASE) MCG/ACT inhaler Inhale 2 puffs into the lungs every 4 (four) hours as needed for wheezing or shortness of breath. Shortness of breath      . beclomethasone (QVAR) 40 MCG/ACT inhaler Inhale 2 puffs into the lungs 2 (two) times daily.      . clorazepate (TRANXENE) 7.5 MG tablet Take 7.5 mg by mouth as needed for anxiety.       [name] DM-Doxylamine-Acetaminophen (DELSYM NIGHT TIME MULTI-SYMPT) 15-6.25-325 MG/15ML LIQD Take 30 mLs by mouth at bedtime as needed (cough).      . DULoxetine HCl (CYMBALTA PO) Take 1 tablet by mouth daily with supper.      . hydrochlorothiazide (MICROZIDE) 12.5 MG capsule Take 12.5 mg by mouth daily with breakfast.       . losartan (COZAAR) 50 MG tablet Take 50 mg by mouth 2 (two) times daily.       . methocarbamol (ROBAXIN) 750 MG tablet Take 750 mg by mouth every  6 (six) hours as needed for muscle spasms.      . pantoprazole (PROTONIX) 40 MG tablet Take 1 tablet (40 mg total) by mouth 2 (two) times daily.  180 tablet  3  . simvastatin (ZOCOR) 20 MG tablet Take 20 mg by mouth every evening.      . triamcinolone (NASACORT ALLERGY 24HR) 55 MCG/ACT AERO nasal inhaler Place 2 sprays into the nose daily.  1 Inhaler  12  . dicyclomine (BENTYL) 20 MG tablet Take 20 mg by mouth 2 (two) times daily as needed for spasms. Take 1 tab twice daily for cramping and spasms      . baclofen (LIORESAL) 10 MG tablet Take 1  tablet (10 mg total) by mouth 3 (three) times daily.  30 each  5  . diclofenac (VOLTAREN) 75 MG EC tablet Take 75 mg by mouth daily.      . sucralfate (CARAFATE) 1 G tablet Take 1 tablet (1 g total) by mouth 4 (four) times daily -  with meals and at bedtime.  90 tablet  1   No facility-administered medications prior to visit.   No Known Allergies Patient Active Problem List   Diagnosis Date Noted  . Diverticulosis of colon without hemorrhage 06/26/2013  . Abdominal pain, right upper quadrant 01/04/2013  . Nausea alone 01/04/2013  . Hoarseness of voice 01/04/2013  . Diarrhea 01/04/2013  . Dyspnea 11/05/2011  . Upper airway cough syndrome 11/05/2011  . Asthma, persistent controlled 11/05/2011  . Morbid obesity with BMI of 45.0-49.9, adult 11/05/2011  . Unstable angina 09/09/2011  . HTN (hypertension) 09/09/2011  . Hyperlipemia 09/09/2011   History  Substance Use Topics  . Smoking status: Passive Smoke Exposure - Never Smoker  . Smokeless tobacco: Never Used     Comment: as a teenager tried a couple times  . Alcohol Use: No   family history includes Arthritis in her mother; Diabetes in her mother; Esophageal cancer (age of onset: 31) in her father; Hypertension in her mother. There is no history of Coronary artery disease.     Objective:   Physical Exam  well-developed older white female no acute distress, voice is hoarse blood pressure 110/90 pulse 74 height 5 foot 3 weight 274, BMI 49.03. HEENT; nontraumatic normocephalic EOMI PERRLA sclera anicteric, Supple ;no JVD, Cardiovascular; regular rate and rhythm with S1-S2 no murmur or gallop, Pulmonary; clear bilaterally, Abdomen; obese soft she is tender in the right upper quadrant right mid quadrant there is no guarding or rebound no palpable mass or hepatosplenomegaly she does have cholecystectomy scar bowel sounds are present, Rectal; exam not done, Ext;no clubbing cyanosis or edema skin warm and dry, Psych; mood and affect  appropriate        Assessment & Plan:  #46  68 year old female with acute illness with right-sided abdominal pain cramping diarrhea and nausea. Symptoms x5 days. Rule out acute gastroenteritis versus possible C. difficile colitis. #2 chronic GERD #3 vocal cord dysfunction #4 morbid obesity #5 diffuse diverticular disease #6 hypertension #7 hyperlipidemia #8 status post cholecystectomy  Plan CBC with differential, be met and GI pathogen panel today Increase Bentyl to 10 mg by mouth every 6 hours when necessary for cramping and pain Add Ultram  50 mg every 6 hours when necessary pain Add Zofran 4 mg every 6 hours when necessary nausea Start empiric Flagyl 250 mg by mouth 4 times daily x10 days Stop baclofen Continue Protonix 40 mg as previous ;

## 2013-06-26 NOTE — Patient Instructions (Signed)
Please go to the basement level to have your labs drawn and for a stool study. We sent prescriptions to [facility] [address] [place], [place]. 1. Dicyclomine ( Bentyl)  2. Zofran  4 mg (Ondansetron )  3. Flagyl 250 mg 4. Ultram ( Tramadol)  We faxed to the pharmacy.  We made you a follow up appointment with Dr. Deatra Ina on 08-08-2013 at 8:45 am.

## 2013-06-27 ENCOUNTER — Other Ambulatory Visit: Payer: Medicare Other

## 2013-06-27 DIAGNOSIS — R197 Diarrhea, unspecified: Secondary | ICD-10-CM

## 2013-06-27 DIAGNOSIS — R110 Nausea: Secondary | ICD-10-CM

## 2013-06-27 DIAGNOSIS — R1011 Right upper quadrant pain: Secondary | ICD-10-CM

## 2013-06-27 NOTE — Progress Notes (Signed)
Reviewed and agree with management. Robert D. Kaplan, M.D., FACG  

## 2013-06-28 LAB — GASTROINTESTINAL PATHOGEN PANEL PCR
C. DIFFICILE TOX A/B, PCR: NEGATIVE
CAMPYLOBACTER, PCR: NEGATIVE
Cryptosporidium, PCR: NEGATIVE
E COLI (ETEC) LT/ST, PCR: NEGATIVE
E coli (STEC) stx1/stx2, PCR: NEGATIVE
E coli 0157, PCR: NEGATIVE
Giardia lamblia, PCR: NEGATIVE
Norovirus, PCR: NEGATIVE
Rotavirus A, PCR: NEGATIVE
Salmonella, PCR: NEGATIVE
Shigella, PCR: NEGATIVE

## 2013-07-02 ENCOUNTER — Telehealth: Payer: Self-pay | Admitting: Physician Assistant

## 2013-07-02 NOTE — Telephone Encounter (Signed)
Spoke with patient and told her GI pathogen is negative. She wants to let Nicoletta Ba, PA know she is much better since taking antibiotics.

## 2013-07-03 ENCOUNTER — Encounter: Payer: Self-pay | Admitting: Pulmonary Disease

## 2013-07-03 ENCOUNTER — Ambulatory Visit (INDEPENDENT_AMBULATORY_CARE_PROVIDER_SITE_OTHER): Payer: Medicare Other | Admitting: Pulmonary Disease

## 2013-07-03 VITALS — BP 126/76 | HR 106 | Ht 63.5 in | Wt 277.0 lb

## 2013-07-03 DIAGNOSIS — R0989 Other specified symptoms and signs involving the circulatory and respiratory systems: Secondary | ICD-10-CM

## 2013-07-03 DIAGNOSIS — J45998 Other asthma: Secondary | ICD-10-CM

## 2013-07-03 DIAGNOSIS — R0609 Other forms of dyspnea: Secondary | ICD-10-CM | POA: Diagnosis not present

## 2013-07-03 DIAGNOSIS — J45909 Unspecified asthma, uncomplicated: Secondary | ICD-10-CM | POA: Diagnosis not present

## 2013-07-03 DIAGNOSIS — R05 Cough: Secondary | ICD-10-CM | POA: Diagnosis not present

## 2013-07-03 DIAGNOSIS — R0683 Snoring: Secondary | ICD-10-CM | POA: Insufficient documentation

## 2013-07-03 DIAGNOSIS — R0600 Dyspnea, unspecified: Secondary | ICD-10-CM

## 2013-07-03 DIAGNOSIS — R000 Tachycardia, unspecified: Secondary | ICD-10-CM | POA: Insufficient documentation

## 2013-07-03 DIAGNOSIS — R059 Cough, unspecified: Secondary | ICD-10-CM

## 2013-07-03 DIAGNOSIS — R110 Nausea: Secondary | ICD-10-CM

## 2013-07-03 DIAGNOSIS — R058 Other specified cough: Secondary | ICD-10-CM

## 2013-07-03 MED ORDER — ALBUTEROL SULFATE (2.5 MG/3ML) 0.083% IN NEBU: 2.5000 mg | INHALATION_SOLUTION | Freq: Four times a day (QID) | RESPIRATORY_TRACT | Status: DC | PRN

## 2013-07-03 MED ORDER — BUDESONIDE 0.5 MG/2ML IN SUSP: 0.5000 mg | Freq: Two times a day (BID) | RESPIRATORY_TRACT | Status: DC

## 2013-07-03 NOTE — Assessment & Plan Note (Signed)
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  She is to continue her sinus regimen, and f/u at [facility] voice center for speech therapy.  Advised her to avoid forcing cough/clearing throat and allowing for voice rest when able.  She can also sip on water when she has urge to cough, and use sugarless candy to keep her mouth moist.  She has very sensitive gag and does not think she could do salt water garlges.  Will also change her inhaler regimen to determine if this reduces upper airway irritation.  I explained that improvement for this can be a slow process.

## 2013-07-03 NOTE — Progress Notes (Signed)
Chief Complaint  Patient presents with  . Follow-up    Breathing has gotten worse since last OV. Reports hoarseness and SOB    History of Present Illness: [name] is a 68 y.o. female never smoker with dyspnea likely related to vocal cord dysfunction, deconditioning, upper airway cough syndrome, and asthma.  Since her last visit she was seen at [facility] voice center.  Concern was for vocal cord atrophy.  She will f/u with speech therapy therapy.  It was recommended that she get PFT and sleep study.  She feels Qvar makes her breathing worse.  She does not think she has trouble with her sleep.  Her husband reports that she snores occasionally.  She continues to have trouble with her stomach and throwing up.  She is followed by Dr. Deatra Ina with GI.    She gets wheeze in her throat.  She gets short of breath when she does activities, but then feels better after a few minutes of rest.  She has noticed that her heart rate has been staying high.   TESTS: CT chest 10/07/11 >> mild mosaic attenuation, coronary atherosclerosis   [name]  has a past medical history of Asthma; Arthritis; Hypertension; Hypercholesteremia; Unstable angina (09/09/2011); HTN (hypertension) (09/09/2011); Hyperlipemia (09/09/2011); GERD (gastroesophageal reflux disease) (09/09/2011); Anxiety; Hiatal hernia; Diverticulosis; Antral polyp; IBS (irritable bowel syndrome); Obesity; Internal hemorrhoid; Vocal cord dysfunction (2013); PONV (postoperative nausea and vomiting); COPD (chronic obstructive pulmonary disease); and Shortness of breath.  [name]  has past surgical history that includes Total knee arthroplasty (Bilateral); Cholecystectomy; Abdominal hysterectomy (1988); Tumor excision (1964); Shoulder surgery (Right, 1987); Wrist surgery (Left, 1997, 2009 ); Elbow surgery (Left); Foot osteotomy w/ plantar fascia release (Left); Ankle reconstruction (Right); hip ilioban release (Right, 1997); Total ankle  replacement (Right, 2012); Ankle Fusion (Right); thumb replacement (Bilateral); Back surgery [phone]); lower back (sept 2014); Nasal sinus surgery (1997); Esophagogastroduodenoscopy ([name], 03/06/2013); and BRAVO ph study ([name], 03/06/2013).  Prior to Admission medications   Medication Sig Start Date End Date Taking? Authorizing Provider  acetaminophen-codeine (TYLENOL #3) 300-30 MG per tablet Take 1 tablet by mouth every 4 (four) hours as needed for moderate pain or severe pain.    Yes Historical Provider, MD  albuterol (PROVENTIL HFA;VENTOLIN HFA) 108 (90 BASE) MCG/ACT inhaler Inhale 2 puffs into the lungs every 4 (four) hours as needed for wheezing or shortness of breath. Shortness of breath   Yes Historical Provider, MD  beclomethasone (QVAR) 40 MCG/ACT inhaler Inhale 2 puffs into the lungs 2 (two) times daily.   Yes Historical Provider, MD  clorazepate (TRANXENE) 7.5 MG tablet Take 7.5 mg by mouth as needed for anxiety.    Yes Historical Provider, MD  diclofenac (VOLTAREN) 75 MG EC tablet Take 75 mg by mouth daily.   Yes Historical Provider, MD  dicyclomine (BENTYL) 20 MG tablet Take 1 tab every 6 hours as needed  for cramping and spasms 06/26/13  Yes Amy S Esterwood, PA-C  DM-Doxylamine-Acetaminophen (DELSYM NIGHT TIME MULTI-SYMPT) 15-6.25-325 MG/15ML LIQD Take 30 mLs by mouth at bedtime as needed (cough). 06/01/13  Yes Chesley Mires, MD  DULoxetine HCl (CYMBALTA PO) Take 1 tablet by mouth daily with supper.   Yes Historical Provider, MD  hydrochlorothiazide (MICROZIDE) 12.5 MG capsule Take 12.5 mg by mouth daily with breakfast.    Yes Historical Provider, MD  losartan (COZAAR) 50 MG tablet Take 50 mg by mouth 2 (two) times daily.    Yes Historical Provider, MD  metroNIDAZOLE (FLAGYL)  250 MG tablet Take 1 tablet (250 mg total) by mouth 4 (four) times daily. 06/26/13 07/06/13 Yes Amy S Esterwood, PA-C  ondansetron (ZOFRAN) 4 MG tablet Take 1 tablet (4 mg total) by mouth every 8 (eight) hours as  needed for nausea or vomiting. 06/26/13  Yes Amy S Esterwood, PA-C  pantoprazole (PROTONIX) 40 MG tablet Take 1 tablet (40 mg total) by mouth 2 (two) times daily. 04/05/13  Yes Inda Castle, MD  simvastatin (ZOCOR) 20 MG tablet Take 20 mg by mouth every evening.   Yes Historical Provider, MD  sucralfate (CARAFATE) 1 G tablet Take 1 tablet (1 g total) by mouth 4 (four) times daily -  with meals and at bedtime. 04/05/13  Yes Inda Castle, MD  traMADol (ULTRAM) 50 MG tablet Take 1 tablet (50 mg total) by mouth every 6 (six) hours as needed. 06/26/13  Yes Amy S Esterwood, PA-C  triamcinolone (NASACORT ALLERGY 24HR) 55 MCG/ACT AERO nasal inhaler Place 2 sprays into the nose daily. 06/01/13  Yes Chesley Mires, MD    No Known Allergies   Physical Exam:  General - No distress, hoarse voice ENT - No sinus tenderness, no oral exudate, no LAN, wheeze over neck with partial improvement with purse lip breathing Cardiac - s1s2 regular, tachycardic, no murmur Chest - No wheeze/rales/dullness Back - No focal tenderness Abd - Soft, non-tender Ext - No edema Neuro - Normal strength Skin - No rashes Psych - normal mood, and behavior  Dg Chest 2 View  06/01/2013   CLINICAL DATA:  Cough.  EXAM: CHEST  2 VIEW  COMPARISON:  CT ANGIO CHEST W/CM &/OR WO/CM dated 10/08/2011; DG CHEST 2 VIEW dated 09/07/2011  FINDINGS: The heart size and mediastinal contours are within normal limits. Small hiatal hernia. Both lungs are clear. Degenerative changes thoracic spine. .  IMPRESSION: No active cardiopulmonary disease.  Small hiatal hernia.   Electronically Signed   By: Marcello Moores  Register   On: 06/01/2013 15:31   CBC    Component Value Date/Time   WBC 8.6 06/26/2013 1640   RBC 4.55 06/26/2013 1640   HGB 13.1 06/26/2013 1640   HCT 38.9 06/26/2013 1640   PLT 237.0 06/26/2013 1640   MCV 85.4 06/26/2013 1640   MCHC 33.6 06/26/2013 1640   RDW 14.9* 06/26/2013 1640   LYMPHSABS 2.7 06/26/2013 1640   MONOABS 0.5 06/26/2013 1640   EOSABS 0.1  06/26/2013 1640   BASOSABS 0.1 06/26/2013 1640    CMP     Component Value Date/Time   NA 135 06/26/2013 1640   K 4.7 06/26/2013 1640   CL 100 06/26/2013 1640   CO2 27 06/26/2013 1640   GLUCOSE 111* 06/26/2013 1640   BUN 17 06/26/2013 1640   CREATININE 0.9 06/26/2013 1640   CALCIUM 9.6 06/26/2013 1640       Assessment/Plan:  Chesley Mires, MD [place] [facility] Pager:  [phone]

## 2013-07-03 NOTE — Assessment & Plan Note (Signed)
She has developed resting tachycardia >> she reports this happens even when she does not use her albuterol.  She was previously seen by Dr. Gwenlyn Found with cardiology.  Will arrange for referral to cardiology to further assess.

## 2013-07-03 NOTE — Telephone Encounter (Signed)
Ok, good!

## 2013-07-03 NOTE — Patient Instructions (Signed)
Pulmicort one vial nebulized twice per day >> rinse mouth after each use Albuterol one vial nebulized up to four times per day as needed for cough, wheeze or chest congestion Stop Qvar while using pulmicort Will arrange for breathing test (PFT) Will arrange for cardiology evaluation Follow up in 4 to 6 weeks

## 2013-07-03 NOTE — Assessment & Plan Note (Signed)
Main concern still is related to vocal cord dysfunction and deconditioning.  She may have a component of asthma, but this does not seem prominent at present.

## 2013-07-03 NOTE — Assessment & Plan Note (Signed)
Will arrange for PFT's to further assess.  Will have her stop Qvar, and change to nebulized pulmicort and albuterol.

## 2013-07-03 NOTE — Assessment & Plan Note (Signed)
She is to f/u with GI.

## 2013-07-03 NOTE — Assessment & Plan Note (Signed)
She has mild snoring, and is not convinced she has sleep disorder.  She would like to defer sleep study for now.

## 2013-07-09 ENCOUNTER — Telehealth: Payer: Self-pay | Admitting: Gastroenterology

## 2013-07-09 DIAGNOSIS — M25559 Pain in unspecified hip: Secondary | ICD-10-CM | POA: Diagnosis not present

## 2013-07-09 NOTE — Telephone Encounter (Signed)
Pt had a steroid injection today into her hip. Her doctor would like to know if she can take Celebrex or would it cause her more GI problems. Please advise.

## 2013-07-10 NOTE — Telephone Encounter (Signed)
Okay to take Celebrex provided that it doesn't bother her stomach or worsen her GERD

## 2013-07-10 NOTE — Telephone Encounter (Signed)
Spoke with pt and she is aware.

## 2013-07-11 DIAGNOSIS — R490 Dysphonia: Secondary | ICD-10-CM | POA: Diagnosis not present

## 2013-07-12 ENCOUNTER — Telehealth: Payer: Self-pay | Admitting: Gastroenterology

## 2013-07-12 ENCOUNTER — Telehealth: Payer: Self-pay | Admitting: Pulmonary Disease

## 2013-07-12 NOTE — Telephone Encounter (Signed)
Pts appt rescheduled to 08/03/13@10 :30am. Pt aware of appt.

## 2013-07-12 NOTE — Telephone Encounter (Signed)
Spoke with the pt  She states that she is fine with [place] faxing next ov note and PFT that will be done in May  I advised we will take care of this after she is seen then  Nothing further needed per pt

## 2013-07-12 NOTE — Telephone Encounter (Signed)
Called and spoke with pt and she is aware of how to use the nebulizer medications now.  She voiced her understanding and is aware.    Pt stated that she has been seeing the [place] voice center and they would like to have copies of any notes that VS feels would be beneficial for the pts care with them.  They would like to have a copy of the next ov note and PFT that is due on 5-15.    Fax #  [phone] Phone#  [phone]  VS please advise. thanks

## 2013-07-12 NOTE — Telephone Encounter (Signed)
If pt is okay with this, then okay to send copy of my notes.

## 2013-07-18 DIAGNOSIS — R490 Dysphonia: Secondary | ICD-10-CM | POA: Diagnosis not present

## 2013-07-20 DIAGNOSIS — M25559 Pain in unspecified hip: Secondary | ICD-10-CM | POA: Diagnosis not present

## 2013-07-25 DIAGNOSIS — R490 Dysphonia: Secondary | ICD-10-CM | POA: Diagnosis not present

## 2013-08-03 ENCOUNTER — Ambulatory Visit (INDEPENDENT_AMBULATORY_CARE_PROVIDER_SITE_OTHER): Payer: Medicare Other | Admitting: Pulmonary Disease

## 2013-08-03 ENCOUNTER — Encounter: Payer: Self-pay | Admitting: Gastroenterology

## 2013-08-03 ENCOUNTER — Ambulatory Visit (INDEPENDENT_AMBULATORY_CARE_PROVIDER_SITE_OTHER): Payer: Medicare Other | Admitting: Gastroenterology

## 2013-08-03 ENCOUNTER — Other Ambulatory Visit (INDEPENDENT_AMBULATORY_CARE_PROVIDER_SITE_OTHER): Payer: Medicare Other

## 2013-08-03 ENCOUNTER — Encounter: Payer: Self-pay | Admitting: Pulmonary Disease

## 2013-08-03 VITALS — BP 100/70 | HR 100 | Ht 63.25 in | Wt 274.4 lb

## 2013-08-03 VITALS — BP 114/70 | HR 100 | Ht 63.0 in | Wt 275.0 lb

## 2013-08-03 DIAGNOSIS — R0683 Snoring: Secondary | ICD-10-CM

## 2013-08-03 DIAGNOSIS — R059 Cough, unspecified: Secondary | ICD-10-CM

## 2013-08-03 DIAGNOSIS — R197 Diarrhea, unspecified: Secondary | ICD-10-CM

## 2013-08-03 DIAGNOSIS — R0609 Other forms of dyspnea: Secondary | ICD-10-CM

## 2013-08-03 DIAGNOSIS — G4733 Obstructive sleep apnea (adult) (pediatric): Secondary | ICD-10-CM | POA: Diagnosis not present

## 2013-08-03 DIAGNOSIS — R000 Tachycardia, unspecified: Secondary | ICD-10-CM

## 2013-08-03 DIAGNOSIS — R058 Other specified cough: Secondary | ICD-10-CM

## 2013-08-03 DIAGNOSIS — R05 Cough: Secondary | ICD-10-CM | POA: Diagnosis not present

## 2013-08-03 DIAGNOSIS — J45909 Unspecified asthma, uncomplicated: Secondary | ICD-10-CM

## 2013-08-03 DIAGNOSIS — J45998 Other asthma: Secondary | ICD-10-CM

## 2013-08-03 DIAGNOSIS — R0989 Other specified symptoms and signs involving the circulatory and respiratory systems: Secondary | ICD-10-CM

## 2013-08-03 LAB — MAGNESIUM: Magnesium: 1.8 mg/dL (ref 1.5–2.5)

## 2013-08-03 LAB — CBC
HEMATOCRIT: 37.5 % (ref 36.0–46.0)
HEMOGLOBIN: 12.6 g/dL (ref 12.0–15.0)
MCHC: 33.4 g/dL (ref 30.0–36.0)
MCV: 87.2 fl (ref 78.0–100.0)
Platelets: 218 10*3/uL (ref 150.0–400.0)
RBC: 4.3 Mil/uL (ref 3.87–5.11)
RDW: 15.4 % (ref 11.5–15.5)
WBC: 8.9 10*3/uL (ref 4.0–10.5)

## 2013-08-03 LAB — TSH: TSH: 1.37 u[IU]/mL (ref 0.35–4.50)

## 2013-08-03 LAB — COMPREHENSIVE METABOLIC PANEL
ALT: 20 U/L (ref 0–35)
AST: 21 U/L (ref 0–37)
Albumin: 3.7 g/dL (ref 3.5–5.2)
Alkaline Phosphatase: 62 U/L (ref 39–117)
BILIRUBIN TOTAL: 0.5 mg/dL (ref 0.2–1.2)
BUN: 21 mg/dL (ref 6–23)
CO2: 27 mEq/L (ref 19–32)
CREATININE: 1.1 mg/dL (ref 0.4–1.2)
Calcium: 9.1 mg/dL (ref 8.4–10.5)
Chloride: 102 mEq/L (ref 96–112)
GFR: 53.22 mL/min — AB (ref >60.00)
Glucose, Bld: 107 mg/dL — ABNORMAL HIGH (ref 70–99)
Potassium: 4.1 mEq/L (ref 3.5–5.1)
Sodium: 139 mEq/L (ref 135–145)
Total Protein: 6.5 g/dL (ref 6.0–8.3)

## 2013-08-03 MED ORDER — ALOSETRON HCL 1 MG PO TABS: 1.0000 mg | ORAL_TABLET | Freq: Two times a day (BID) | ORAL | Status: DC

## 2013-08-03 MED ORDER — TRAMADOL HCL 50 MG PO TABS: 50.0000 mg | ORAL_TABLET | Freq: Four times a day (QID) | ORAL | Status: DC | PRN

## 2013-08-03 MED ORDER — METOCLOPRAMIDE HCL 10 MG PO TABS: ORAL_TABLET | ORAL | Status: DC

## 2013-08-03 NOTE — Addendum Note (Signed)
Addended by: Oda Kilts on: 08/03/2013 11:38 AM   Modules accepted: Orders

## 2013-08-03 NOTE — Assessment & Plan Note (Signed)
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  May also have snoring/sleep apnea as contributor to her upper airway instability.

## 2013-08-03 NOTE — Assessment & Plan Note (Signed)
She is concerned that she could have a thyroid disorder causing her tachycardia.  Will check labs today.  She is scheduled for cardiology appointment later this month.

## 2013-08-03 NOTE — Assessment & Plan Note (Addendum)
She has snoring, sleep disruption, and daytime sleepiness.  She has a history of hypertension.  We discussed how sleep apnea can affect various health problems including risks for hypertension, cardiovascular disease, and diabetes.  Also discussed how sleep apnea can contribute to upper airway instability, and increase frequency of reflux at night.  In addition we reviewed how sleep disruption can increase risks for accident, such as while driving.  Weight loss as a means of improving sleep apnea was also reviewed.  Additional treatment options discussed were CPAP therapy, oral appliance, and surgical intervention.  To further assess will arrange for in lab sleep study.

## 2013-08-03 NOTE — Assessment & Plan Note (Signed)
Continue scheduled budesonide with prn albuterol for now.

## 2013-08-03 NOTE — Assessment & Plan Note (Signed)
At this point I suspect that the patient's symptoms are 2 to IBS.  Recommendations #1 trial of Lotronex 1 mg twice a day

## 2013-08-03 NOTE — Assessment & Plan Note (Signed)
I remain suspicious that upper airway symptoms of cough and hoarseness are 2 to GERD.  Symptoms did not improved with baclofen.  Bravo pH study ruled out acid reflux but this does not rule out non-acid reflux.  Recommendations #1 trial of Reglan 10 mg one half hour a.c. and at bedtime #2 reduce Protonix to 40 mg daily #3 gastroparesis diet #4 if symptoms have not improved I will refer her for impedance plethysmography  Instruct patient to  contact me immediately  if she develops any side effects from her Reglan including paresthesias, tremors, confusion , weakness or muscle spasms.

## 2013-08-03 NOTE — Patient Instructions (Signed)
Reduce Protonix to once a day Follow up with first available appointment Prescription of Reglan will be sent to your pharmacy Lotronex prescription printed for you to take to your pharmacy  Gastroparesis  Gastroparesis is also called slowed stomach emptying (delayed gastric emptying). It is a condition in which the stomach takes too long to empty its contents. It often happens in people with diabetes.  CAUSES  Gastroparesis happens when nerves to the stomach are damaged or stop working. When the nerves are damaged, the muscles of the stomach and intestines do not work normally. The movement of food is slowed or stopped. High blood glucose (sugar) causes changes in nerves and can damage the blood vessels that carry oxygen and nutrients to the nerves. RISK FACTORS  Diabetes.  Post-viral syndromes.  Eating disorders (anorexia, bulimia).  Surgery on the stomach or vagus nerve.  Gastroesophageal reflux disease (rarely).  Smooth muscle disorders (amyloidosis, scleroderma).  Metabolic disorders, including hypothyroidism.  Parkinson disease. SYMPTOMS   Heartburn.  Feeling sick to your stomach (nausea).  Vomiting of undigested food.  An early feeling of fullness when eating.  Weight loss.  Abdominal bloating.  Erratic blood glucose levels.  Lack of appetite.  Gastroesophageal reflux.  Spasms of the stomach wall. Complications can include:  Bacterial overgrowth in stomach. Food stays in the stomach and can ferment and cause bacteria to grow.  Weight loss due to difficulty digesting and absorbing nutrients.  Vomiting.  Obstruction in the stomach. Undigested food can harden and cause nausea and vomiting.  Blood glucose fluctuations caused by inconsistent food absorption. DIAGNOSIS  The diagnosis of gastroparesis is confirmed through one or more of the following tests:  Barium X-rays and scans. These tests look at how long it takes for food to move through the  stomach.  Gastric manometry. This test measures electrical and muscular activity in the stomach. A thin tube is passed down the throat into the stomach. The tube contains a wire that takes measurements of the stomach's electrical and muscular activity as it digests liquids and solid food.  Endoscopy. This procedure is done with a long, thin tube called an endoscope. It is passed through the mouth and gently down the esophagus into the stomach. This tube helps the caregiver look at the lining of the stomach to check for any abnormalities.  Ultrasonography. This can rule out gallbladder disease or pancreatitis. This test will outline and define the shape of the gallbladder and pancreas. TREATMENT   Treatments may include:  Exercise.  Medicines to control nausea and vomiting.  Medicines to stimulate stomach muscles.  Changes in what and when you eat.  Having smaller meals more often.  Eating low-fiber forms of high-fiber foods, such as eating cooked vegetables instead of raw vegetables.  Eating low-fat foods.  Consuming liquids, which are easier to digest.  In severe cases, feeding tubes and intravenous (IV) feeding may be needed. It is important to note that in most cases, treatment does not cure gastroparesis. It is usually a lasting (chronic) condition. Treatment helps you manage the underlying condition so that you can be as healthy and comfortable as possible. Other treatments  A gastric neurostimulator has been developed to assist people with gastroparesis. The battery-operated device is surgically implanted. It emits mild electrical pulses to help improve stomach emptying and to control nausea and vomiting.  The use of botulinum toxin has been shown to improve stomach emptying by decreasing the prolonged contractions of the muscle between the stomach and the small intestine (  pyloric sphincter). The benefits are temporary. SEEK MEDICAL CARE IF:   You have diabetes and you are  having problems keeping your blood glucose in goal range.  You are having nausea, vomiting, bloating, or early feelings of fullness with eating.  Your symptoms do not change with a change in diet. Document Released: 03/08/2005 Document Revised: 07/03/2012 Document Reviewed: 08/15/2008 [facility] Patient Information 2014 [place], [place].

## 2013-08-03 NOTE — Progress Notes (Signed)
Chief Complaint  Patient presents with  . Follow-up    review PFT.  Pt c/o dizziness after the pft.  Believes that her nebulized medications are helping greatly.     History of Present Illness: [name] is a 68 y.o. female never smoker with dyspnea likely related to vocal cord dysfunction, deconditioning, upper airway cough syndrome, and asthma.  She feels that using budesonide nebulizer has helped.  She is not as hoarse, and able to keep her voice quality longer.  She is not having wheeze or chest congestion as often.  She does not need to use albuterol much >> she gets jittery when she uses albuterol.  She does snore, and has trouble with her breathing while asleep.  She will get sleepy during the day.  She had PFT earlier today.  This was essentially normal except for borderline bronchodilator responsiveness based on change in FEF 25-75%.  She has appointment with Dr. Gwenlyn Found next week.  She saw Dr. Deatra Ina earlier today, and had additional recommendations to control her reflux.  TESTS: CT chest 10/07/11 >> mild mosaic attenuation, coronary atherosclerosis PFT 08/03/13 >> FEV1 2.27 (100%), FEV1% 84, TLC 4.51 (92%), DLCO 88%, +BD from FEF 25-75  [name]  has a past medical history of Asthma; Arthritis; Hypertension; Hypercholesteremia; Unstable angina (09/09/2011); HTN (hypertension) (09/09/2011); Hyperlipemia (09/09/2011); GERD (gastroesophageal reflux disease) (09/09/2011); Anxiety; Hiatal hernia; Diverticulosis; Antral polyp; IBS (irritable bowel syndrome); Obesity; Internal hemorrhoid; Vocal cord dysfunction (2013); PONV (postoperative nausea and vomiting); COPD (chronic obstructive pulmonary disease); and Shortness of breath.  [name]  has past surgical history that includes Total knee arthroplasty (Bilateral); Cholecystectomy; Abdominal hysterectomy (1988); Tumor excision (1964); Shoulder surgery (Right, 1987); Wrist surgery (Left, 1997, 2009 ); Elbow surgery  (Left); Foot osteotomy w/ plantar fascia release (Left); Ankle reconstruction (Right); hip ilioban release (Right, 1997); Total ankle replacement (Right, 2012); Ankle Fusion (Right); thumb replacement (Bilateral); Back surgery [phone]); lower back (sept 2014); Nasal sinus surgery (1997); Esophagogastroduodenoscopy ([name], 03/06/2013); and BRAVO ph study ([name], 03/06/2013).  Prior to Admission medications   Medication Sig Start Date End Date Taking? Authorizing Provider  acetaminophen-codeine (TYLENOL #3) 300-30 MG per tablet Take 1 tablet by mouth every 4 (four) hours as needed for moderate pain or severe pain.    Yes Historical Provider, MD  albuterol (PROVENTIL HFA;VENTOLIN HFA) 108 (90 BASE) MCG/ACT inhaler Inhale 2 puffs into the lungs every 4 (four) hours as needed for wheezing or shortness of breath. Shortness of breath   Yes Historical Provider, MD  beclomethasone (QVAR) 40 MCG/ACT inhaler Inhale 2 puffs into the lungs 2 (two) times daily.   Yes Historical Provider, MD  clorazepate (TRANXENE) 7.5 MG tablet Take 7.5 mg by mouth as needed for anxiety.    Yes Historical Provider, MD  diclofenac (VOLTAREN) 75 MG EC tablet Take 75 mg by mouth daily.   Yes Historical Provider, MD  dicyclomine (BENTYL) 20 MG tablet Take 1 tab every 6 hours as needed  for cramping and spasms 06/26/13  Yes Amy S Esterwood, PA-C  DM-Doxylamine-Acetaminophen (DELSYM NIGHT TIME MULTI-SYMPT) 15-6.25-325 MG/15ML LIQD Take 30 mLs by mouth at bedtime as needed (cough). 06/01/13  Yes Chesley Mires, MD  DULoxetine HCl (CYMBALTA PO) Take 1 tablet by mouth daily with supper.   Yes Historical Provider, MD  hydrochlorothiazide (MICROZIDE) 12.5 MG capsule Take 12.5 mg by mouth daily with breakfast.    Yes Historical Provider, MD  losartan (COZAAR) 50 MG tablet Take 50 mg by mouth 2 (  two) times daily.    Yes Historical Provider, MD  metroNIDAZOLE (FLAGYL) 250 MG tablet Take 1 tablet (250 mg total) by mouth 4 (four) times daily. 06/26/13  07/06/13 Yes Amy S Esterwood, PA-C  ondansetron (ZOFRAN) 4 MG tablet Take 1 tablet (4 mg total) by mouth every 8 (eight) hours as needed for nausea or vomiting. 06/26/13  Yes Amy S Esterwood, PA-C  pantoprazole (PROTONIX) 40 MG tablet Take 1 tablet (40 mg total) by mouth 2 (two) times daily. 04/05/13  Yes Inda Castle, MD  simvastatin (ZOCOR) 20 MG tablet Take 20 mg by mouth every evening.   Yes Historical Provider, MD  sucralfate (CARAFATE) 1 G tablet Take 1 tablet (1 g total) by mouth 4 (four) times daily -  with meals and at bedtime. 04/05/13  Yes Inda Castle, MD  traMADol (ULTRAM) 50 MG tablet Take 1 tablet (50 mg total) by mouth every 6 (six) hours as needed. 06/26/13  Yes Amy S Esterwood, PA-C  triamcinolone (NASACORT ALLERGY 24HR) 55 MCG/ACT AERO nasal inhaler Place 2 sprays into the nose daily. 06/01/13  Yes Chesley Mires, MD    No Known Allergies   Physical Exam:  General - No distress, improved voice quality ENT - No sinus tenderness, no oral exudate, no LAN Cardiac - s1s2 regular, tachycardic, no murmur Chest - No wheeze/rales/dullness Back - No focal tenderness Abd - Soft, non-tender Ext - No edema Neuro - Normal strength Skin - No rashes Psych - normal mood, and behavior  Assessment/Plan:  Chesley Mires, MD [facility] Pager:  [phone]

## 2013-08-03 NOTE — Progress Notes (Signed)
PFT done today. 

## 2013-08-03 NOTE — Progress Notes (Signed)
          History of Present Illness:  [name] continues to complain of hoarseness, regurgitation of gastric contents, and coughing.  At baseline she has at least 3-4 loose stools a day.  She's had bouts of severe diarrhea accompanied by right upper quadrant pain.  She was treated empirically with antibiotics at her last office visit without much improvement.  48 hour bravo pH study was negative for acid reflux.  Gastric empty scan was negative.  Colonoscopy in 2009 demonstrated diverticulosis.    Review of Systems: She complains of left hip pain Pertinent positive and negative review of systems were noted in the above HPI section. All other review of systems were otherwise negative.    Current Medications, Allergies, Past Medical History, Past Surgical History, Family History and Social History were reviewed in [name] record  Vital signs were reviewed in today's medical record. Physical Exam: General: Well developed , well nourished, no acute distress   See Assessment and Plan under Problem List

## 2013-08-03 NOTE — Patient Instructions (Signed)
Blood test today Will arrange for sleep study Will call to arrange for follow up after sleep study reviewed

## 2013-08-06 ENCOUNTER — Telehealth: Payer: Self-pay | Admitting: Pulmonary Disease

## 2013-08-06 ENCOUNTER — Encounter: Payer: Self-pay | Admitting: Cardiovascular Disease

## 2013-08-06 ENCOUNTER — Telehealth: Payer: Self-pay | Admitting: Gastroenterology

## 2013-08-06 ENCOUNTER — Ambulatory Visit (INDEPENDENT_AMBULATORY_CARE_PROVIDER_SITE_OTHER): Payer: Medicare Other | Admitting: Cardiovascular Disease

## 2013-08-06 ENCOUNTER — Encounter: Payer: Self-pay | Admitting: Internal Medicine

## 2013-08-06 VITALS — BP 141/77 | HR 97 | Ht 63.0 in | Wt 277.3 lb

## 2013-08-06 DIAGNOSIS — I10 Essential (primary) hypertension: Secondary | ICD-10-CM

## 2013-08-06 DIAGNOSIS — R000 Tachycardia, unspecified: Secondary | ICD-10-CM

## 2013-08-06 DIAGNOSIS — E785 Hyperlipidemia, unspecified: Secondary | ICD-10-CM | POA: Diagnosis not present

## 2013-08-06 DIAGNOSIS — R002 Palpitations: Secondary | ICD-10-CM

## 2013-08-06 NOTE — Assessment & Plan Note (Signed)
On statin therapy followed by her PCP 

## 2013-08-06 NOTE — Telephone Encounter (Signed)
CMP     Component Value Date/Time   NA 139 08/03/2013 1421   K 4.1 08/03/2013 1421   CL 102 08/03/2013 1421   CO2 27 08/03/2013 1421   GLUCOSE 107* 08/03/2013 1421   BUN 21 08/03/2013 1421   CREATININE 1.1 08/03/2013 1421   CALCIUM 9.1 08/03/2013 1421   PROT 6.5 08/03/2013 1421   ALBUMIN 3.7 08/03/2013 1421   AST 21 08/03/2013 1421   ALT 20 08/03/2013 1421   ALKPHOS 62 08/03/2013 1421   BILITOT 0.5 08/03/2013 1421    CBC    Component Value Date/Time   WBC 8.9 08/03/2013 1421   RBC 4.30 08/03/2013 1421   HGB 12.6 08/03/2013 1421   HCT 37.5 08/03/2013 1421   PLT 218.0 08/03/2013 1421   MCV 87.2 08/03/2013 1421   MCHC 33.4 08/03/2013 1421   RDW 15.4 08/03/2013 1421   Will have my nurse inform pt that lab tests normal.

## 2013-08-06 NOTE — Telephone Encounter (Signed)
Pt states she cannot afford Lotronex and she wants to know if there is an alternative. She also states her paperwork mentioned gastroparesis and a gastroparesis diet. Pt states she never heard the word gastroparesis at her visit and wants to know where this came from. Please advise.

## 2013-08-06 NOTE — Assessment & Plan Note (Signed)
Well-controlled on current medications 

## 2013-08-06 NOTE — Patient Instructions (Signed)
Your physician has recommended that you wear an event monitor for 2 weeks. Event monitors are medical devices that record the heart's electrical activity. Doctors most often [place] these monitors to diagnose arrhythmias. Arrhythmias are problems with the speed or rhythm of the heartbeat. The monitor is a small, portable device. You can wear one while you do your normal daily activities. This is usually used to diagnose what is causing palpitations/syncope (passing out).  Dr Gwenlyn Found will see you in follow-up after you have worn the monitor for 2 weeks.

## 2013-08-06 NOTE — Assessment & Plan Note (Signed)
The patient is a 68 year old morbidly overweight Caucasian female with a history of normal coronary arteries and normal left function referred to me by her pulmonologist, Dr. Halford Chessman , for evaluation of sinus tachycardia.in reviewing her past chart it appears that she's had sinus tachycardia in almost every office visit. He does complain of chronic pain in her back and various joints. There is a history of reactive airways disease as well. Her thyroid function tests are normal. It is unclear whether she has physiologic sinus tachycardia or inappropriate sinus tachycardia although I don't think that it will really matters. I do not see a need to do with beta-blockade. She does have episodes of shortness of breath and palpitations. I'm going to get a 2 week event monitor to rule out any arrhythmia.

## 2013-08-06 NOTE — Progress Notes (Signed)
08/06/2013 [name]   [birth year]  [id number]  Primary Physician [name], MD Primary Cardiologist: Lorretta Harp MD [name]   HPI:  The patient is a 68 year old, severely overweight, married Caucasian female who was seen by Dr. Aldona Bar in the office on September 08, 2011, for chest pain. Her risk factors include hypertension and hyperlipidemia. She also has GERD. She was admitted for heart cath which I performed radially on September 10, 2011, that was essentially normal with normal LV function. Her 2D echo was normal as well. Her major complaints have been progressive dyspnea on exertion. She does have reactive airway disease and had pneumonia in the recent past as well. I saw her last 7-10/13 she has been evaluated at [facility] and was told she had vocal cord dysfunction. She is seeing Dr. Halford Chessman for pulmonary evaluation for her back here for evaluation of sinus tachycardia. She really denies chest pain but does get dyspneic on exertion which was a complaint when I saw her 2 years ago as well. Recent thyroid function tests were normal.    Current Outpatient Prescriptions  Medication Sig Dispense Refill  . acetaminophen-codeine (TYLENOL #3) 300-30 MG per tablet Take 1 tablet by mouth every 4 (four) hours as needed for moderate pain or severe pain.       [name] albuterol (PROVENTIL HFA;VENTOLIN HFA) 108 (90 BASE) MCG/ACT inhaler Inhale 2 puffs into the lungs every 4 (four) hours as needed for wheezing or shortness of breath. Shortness of breath      . albuterol (PROVENTIL) (2.5 MG/3ML) 0.083% nebulizer solution Take 3 mLs (2.5 mg total) by nebulization every 6 (six) hours as needed for wheezing or shortness of breath.  75 mL  12  . budesonide (PULMICORT) 0.5 MG/2ML nebulizer solution Take 2 mLs (0.5 mg total) by nebulization 2 (two) times daily.  120 mL  12  . clorazepate (TRANXENE) 7.5 MG tablet Take 7.5 mg by mouth as needed for anxiety.       .  dicyclomine (BENTYL) 20 MG tablet Take 1 tab every 6 hours as needed  for cramping and spasms  120 tablet  3  . DULoxetine HCl (CYMBALTA PO) Take 1 tablet by mouth daily with supper.      . hydrochlorothiazide (MICROZIDE) 12.5 MG capsule Take 12.5 mg by mouth daily with breakfast.       . losartan (COZAAR) 50 MG tablet Take 50 mg by mouth 2 (two) times daily.       . metoCLOPramide (REGLAN) 10 MG tablet Take one tab one half-hour a.c. and at bedtime  120 tablet  2  . ondansetron (ZOFRAN) 4 MG tablet Take 1 tablet (4 mg total) by mouth every 8 (eight) hours as needed for nausea or vomiting.  40 tablet  0  . pantoprazole (PROTONIX) 40 MG tablet Take 1 tablet (40 mg total) by mouth 2 (two) times daily.  180 tablet  3  . simvastatin (ZOCOR) 20 MG tablet Take 20 mg by mouth every evening.      . traMADol (ULTRAM) 50 MG tablet Take 1 tablet (50 mg total) by mouth every 6 (six) hours as needed.  40 tablet  0  . triamcinolone (NASACORT ALLERGY 24HR) 55 MCG/ACT AERO nasal inhaler Place 2 sprays into the nose daily.  1 Inhaler  12   No current facility-administered medications for this visit.    No Known Allergies  History   Social History  . Marital Status:  Married    Spouse Name: [name]    Number of Children: [name]  . Years of Education: [name]   Occupational History  . retired    Social History Main Topics  . Smoking status: Passive Smoke Exposure - Never Smoker  . Smokeless tobacco: Never Used     Comment: as a teenager tried a couple times  . Alcohol Use: No  . Drug Use: No  . Sexual Activity: Yes   Other Topics Concern  . Not on file   Social History Narrative  . No narrative on file     Review of Systems: General: negative for chills, fever, night sweats or weight changes.  Cardiovascular: negative for chest pain, dyspnea on exertion, edema, orthopnea, palpitations, paroxysmal nocturnal dyspnea or shortness of breath Dermatological: negative for rash Respiratory: negative for cough  or wheezing Urologic: negative for hematuria Abdominal: negative for nausea, vomiting, diarrhea, bright red blood per rectum, melena, or hematemesis Neurologic: negative for visual changes, syncope, or dizziness All other systems reviewed and are otherwise negative except as noted above.    Blood pressure 141/77, pulse 97, height 5\' 3"  (1.6 m), weight 277 lb 4.8 oz (125.782 kg).  General appearance: alert and no distress Neck: no adenopathy, no carotid bruit, no JVD, supple, symmetrical, trachea midline and thyroid not enlarged, symmetric, no tenderness/mass/nodules Lungs: clear to auscultation bilaterally Heart: regular rate and rhythm, S1, S2 normal, no murmur, click, rub or gallop Extremities: extremities normal, atraumatic, no cyanosis or edema  EKG sinus tachycardia at 100 without ST or T wave changes. 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  ASSESSMENT AND PLAN:   HTN (hypertension) Well-controlled on current medications  Hyperlipemia On statin therapy followed by her PCP  Tachycardia The patient is a 68 year old morbidly overweight Caucasian female with a history of normal coronary arteries and normal left function referred to me by her pulmonologist, Dr. Halford Chessman , for evaluation of sinus tachycardia.in reviewing her past chart it appears that she's had sinus tachycardia in almost every office visit. He does complain of chronic pain in her back and various joints. There is a history of reactive airways disease as well. Her thyroid function tests are normal. It is unclear whether she has physiologic sinus tachycardia or inappropriate sinus tachycardia although I don't think that it will really matters. I do not see a need to do with beta-blockade. She does have episodes of shortness of breath and palpitations. I'm going to get a 2 week event monitor to rule out any arrhythmia.      Lorretta Harp MD FACP,FACC,FAHA, [facility] 08/06/2013 4:41 PM

## 2013-08-07 NOTE — Telephone Encounter (Signed)
She does not have gastroparesis but frequent small feedings may help her symptoms - hence a gastroparesis diet. Can try immodium 2 tabs qam then q6h prn

## 2013-08-07 NOTE — Telephone Encounter (Signed)
Put her on Amy Esterwoods schedule per Patients request  08/16/2013  Wanted to see someone sooner than Dr Guillermina City next available which was 7/28  Patient aware

## 2013-08-07 NOTE — Telephone Encounter (Signed)
Pt is aware of results. 

## 2013-08-07 NOTE — Telephone Encounter (Signed)
lmtcb x1 

## 2013-08-07 NOTE — Telephone Encounter (Signed)
Dr Deatra Ina, Please advise on alternative med for Lotonex  Also patient wants to know why we gave her a gastroporesis information  Her GES was normal in 12/2012

## 2013-08-08 ENCOUNTER — Ambulatory Visit: Payer: Medicare Other | Admitting: Pulmonary Disease

## 2013-08-08 ENCOUNTER — Ambulatory Visit: Payer: Medicare Other | Admitting: Cardiovascular Disease

## 2013-08-08 ENCOUNTER — Ambulatory Visit: Payer: Medicare Other | Admitting: Gastroenterology

## 2013-08-14 ENCOUNTER — Telehealth: Payer: Self-pay | Admitting: Cardiovascular Disease

## 2013-08-14 DIAGNOSIS — M545 Low back pain, unspecified: Secondary | ICD-10-CM | POA: Diagnosis not present

## 2013-08-14 DIAGNOSIS — M25559 Pain in unspecified hip: Secondary | ICD-10-CM | POA: Diagnosis not present

## 2013-08-16 ENCOUNTER — Ambulatory Visit: Payer: Medicare Other | Admitting: Physician Assistant

## 2013-08-16 DIAGNOSIS — M169 Osteoarthritis of hip, unspecified: Secondary | ICD-10-CM | POA: Diagnosis not present

## 2013-08-16 DIAGNOSIS — M1610 Unilateral primary osteoarthritis, unspecified hip: Secondary | ICD-10-CM | POA: Diagnosis not present

## 2013-08-22 ENCOUNTER — Other Ambulatory Visit: Payer: Self-pay

## 2013-08-22 DIAGNOSIS — R002 Palpitations: Secondary | ICD-10-CM

## 2013-08-24 NOTE — Telephone Encounter (Signed)
Closed encounter °

## 2013-08-27 ENCOUNTER — Encounter: Payer: Self-pay | Admitting: Physician Assistant

## 2013-08-27 ENCOUNTER — Telehealth: Payer: Self-pay | Admitting: Cardiovascular Disease

## 2013-08-27 ENCOUNTER — Ambulatory Visit (INDEPENDENT_AMBULATORY_CARE_PROVIDER_SITE_OTHER): Payer: Medicare Other | Admitting: Physician Assistant

## 2013-08-27 VITALS — BP 120/68 | HR 86 | Ht 63.25 in | Wt 278.2 lb

## 2013-08-27 DIAGNOSIS — R059 Cough, unspecified: Secondary | ICD-10-CM

## 2013-08-27 DIAGNOSIS — R058 Other specified cough: Secondary | ICD-10-CM

## 2013-08-27 DIAGNOSIS — K219 Gastro-esophageal reflux disease without esophagitis: Secondary | ICD-10-CM

## 2013-08-27 DIAGNOSIS — K589 Irritable bowel syndrome without diarrhea: Secondary | ICD-10-CM | POA: Diagnosis not present

## 2013-08-27 DIAGNOSIS — R05 Cough: Secondary | ICD-10-CM | POA: Diagnosis not present

## 2013-08-27 DIAGNOSIS — R197 Diarrhea, unspecified: Secondary | ICD-10-CM | POA: Diagnosis not present

## 2013-08-27 DIAGNOSIS — Z860100 Personal history of colon polyps, unspecified: Secondary | ICD-10-CM | POA: Insufficient documentation

## 2013-08-27 MED ORDER — METOCLOPRAMIDE HCL 10 MG PO TABS: ORAL_TABLET | ORAL | Status: DC

## 2013-08-27 MED ORDER — DICYCLOMINE HCL 20 MG PO TABS: ORAL_TABLET | ORAL | Status: DC

## 2013-08-27 NOTE — Progress Notes (Signed)
Subjective:    Patient ID: [name], female    DOB: 1945-04-12, 68 y.o.   MRN: [id number]  HPI  [name] is a pleasant 68 year old white female known to Dr. Deatra Ina who has also recently been seen by myself. She has multiple medical issues including morbid obesity, sleep apnea, asthma, chronic GERD, vocal cord dysfunction, universal diverticulosis and chronic diarrhea which is felt to be IBS related. Patient has had fairly extensive evaluation for hoarseness and chronic reflux. She had undergone a pH study in the fall of 2014 and this was negative for significant acid reflux. She also had a gastric emptying scan done which was normal. She is being maintained on Protonix 40 mg daily and had metoclopramide 10 mg a.c. and at bedtime added about a month ago. She seems to be tolerating this fairly well. Today she has multiple questions about diarrhea and IBS and wants to know if that is actually her diagnosis. Last colonoscopy was done in 2009 per Dr. Earlean Shawl and showed universal diverticulosis and otherwise was negative. She had been given a trial of Lotronex which would have caused her $900 per month with her co-pay and therefore she did not try this. She was told by Dr. Deatra Ina to only use Bentyl as needed for abdominal   cramping and therefore has not been using this regularly either. Patient says she's been doing fairly well recently and has not had any" major IBS attacks ". With these she gets diarrhea and abdominal cramping and pain which is usually on the right side. She is normally having a couple of bowel movements every day and says that over the weekend she did have a few episodes of urgency postprandially with diarrhea. No melena or hematochezia. Appetite is good. She is interested in having gastric bypass surgery done and is scheduled for her first class coming up. She has been told by her orthopedist that this would help her greatly as she is artery had several joint replacements and needs a  hip replacement.    Review of Systems  Constitutional: Negative.   HENT: Negative.   Eyes: Negative.   Respiratory: Negative.   Cardiovascular: Negative.   Gastrointestinal: Positive for diarrhea.  Endocrine: Negative.   Genitourinary: Negative.   Musculoskeletal: Positive for arthralgias and gait problem.  Skin: Negative.   Allergic/Immunologic: Negative.   Neurological: Negative.   Hematological: Negative.   Psychiatric/Behavioral: Negative.    Outpatient Prescriptions Prior to Visit  Medication Sig Dispense Refill  . albuterol (PROVENTIL) (2.5 MG/3ML) 0.083% nebulizer solution Take 3 mLs (2.5 mg total) by nebulization every 6 (six) hours as needed for wheezing or shortness of breath.  75 mL  12  . budesonide (PULMICORT) 0.5 MG/2ML nebulizer solution Take 2 mLs (0.5 mg total) by nebulization 2 (two) times daily.  120 mL  12  . clorazepate (TRANXENE) 7.5 MG tablet Take 7.5 mg by mouth as needed for anxiety.       . DULoxetine HCl (CYMBALTA PO) Take 1 tablet by mouth daily with supper.      . hydrochlorothiazide (MICROZIDE) 12.5 MG capsule Take 12.5 mg by mouth daily with breakfast.       . losartan (COZAAR) 50 MG tablet Take 50 mg by mouth 2 (two) times daily.       . simvastatin (ZOCOR) 20 MG tablet Take 20 mg by mouth every evening.      . traMADol (ULTRAM) 50 MG tablet Take 1 tablet (50 mg total) by mouth every 6 (six)  hours as needed.  40 tablet  0  . triamcinolone (NASACORT ALLERGY 24HR) 55 MCG/ACT AERO nasal inhaler Place 2 sprays into the nose daily.  1 Inhaler  12  . metoCLOPramide (REGLAN) 10 MG tablet Take one tab one half-hour a.c. and at bedtime  120 tablet  2  . pantoprazole (PROTONIX) 40 MG tablet Take 1 tablet (40 mg total) by mouth 2 (two) times daily.  180 tablet  3  . albuterol (PROVENTIL HFA;VENTOLIN HFA) 108 (90 BASE) MCG/ACT inhaler Inhale 2 puffs into the lungs every 4 (four) hours as needed for wheezing or shortness of breath. Shortness of breath      .  acetaminophen-codeine (TYLENOL #3) 300-30 MG per tablet Take 1 tablet by mouth every 4 (four) hours as needed for moderate pain or severe pain.       [name] dicyclomine (BENTYL) 20 MG tablet Take 1 tab every 6 hours as needed  for cramping and spasms  120 tablet  3  . ondansetron (ZOFRAN) 4 MG tablet Take 1 tablet (4 mg total) by mouth every 8 (eight) hours as needed for nausea or vomiting.  40 tablet  0   No facility-administered medications prior to visit.   No Known Allergies    Patient Active Problem List   Diagnosis Date Noted  . Tachycardia 07/03/2013  . Snoring 07/03/2013  . Diverticulosis of colon without hemorrhage 06/26/2013  . Abdominal pain, right upper quadrant 01/04/2013  . Nausea alone 01/04/2013  . Hoarseness of voice 01/04/2013  . Diarrhea 01/04/2013  . Dyspnea 11/05/2011  . Upper airway cough syndrome 11/05/2011  . Asthma, persistent controlled 11/05/2011  . Morbid obesity with BMI of 45.0-49.9, adult 11/05/2011  . HTN (hypertension) 09/09/2011  . Hyperlipemia 09/09/2011   History  Substance Use Topics  . Smoking status: Passive Smoke Exposure - Never Smoker  . Smokeless tobacco: Never Used     Comment: as a teenager tried a couple times  . Alcohol Use: No   family history includes Arthritis in her mother; Diabetes in her mother; Esophageal cancer in her mother; Esophageal cancer (age of onset: 1) in her father; Heart disease in her maternal grandmother; Hypertension in her mother; Other in her sister. There is no history of Coronary artery disease, Colon cancer, Liver disease, or Kidney disease.  Objective:   Physical Exam     WDWF in NAD,pleasant, accompanied by husband, pt is  Morbidly obese. BMI 48.8, BP;120/68, P;86 HEENT; nontraumatic normocephalic EOMI PERRLA sclera anicteric, Supple ;no JVD, Cardiovascular; regular rate and rhythm with S1-S2 no murmur or gallop, Pulmonary; clear bilaterally, Abdomen ;obese soft mildly tender in the right mid quadrant , no  guarding ,or rebound no palpable mass or hepatosplenomegaly bowel sounds are present, Rectal; exam not done, Extremities; no clubbing cyanosis or edema skin warm and dry, Psych; mood and affect appropriate      Assessment & Plan:  #1  68 year old white female with chronic GERD. Currently doing well on protonix 40 mg daily and metoclopramide 10 a.c. and at bedtime #2 vocal cord dysfunction with chronic hoarseness-unclear whether this may be in part related to GERD though her pH study was negative. Her symptoms are improved. #3 IBS with chronic diarrhea/variable #4 universal diverticulosis #5 morbid obesity BMI 48.8 #6 hypertension #7 sleep apnea  Plan; continue Protonix 40 mg by mouth every morning Continue metoclopramide 10 mg a.c. and at bedtime for now-try to decrease dose and a couple of months Add daily probiotic, patient was given samples of  Restora today Patient encouraged to take Bentyl 10 mg every morning and then up to 3-4 times daily if she is having a bad day with urgency and diarrhea. She may also use Imodium on a when necessary basis We discussed triggers for her IBS symptoms and avoidance of those triggers- she is also given  a FODMAP diet to try to eliminate potential dietary triggers  Office followup in 2-3 months as needed

## 2013-08-27 NOTE — Telephone Encounter (Signed)
Spoke to patient . RN informed patient. Dr Gwenlyn Found has not reviewed monitor. pateint request she is called once results are given. she is anxious does not want to wait until 09/17/13. Will notify Dr Gwenlyn Found Curt Bears RN

## 2013-08-27 NOTE — Patient Instructions (Addendum)
Take a probiotic , 1 tab daily. We have given you samples of Restora. You can get this at [facility], or any pharmacy. We sent prescriptions to your mail order: [facility] 1. Bentyl  ( dicyclomine)  Take 3 times daily for bad cramping and diarrhea. 2. Reglan, (Metoclopramide) Take 1/2 hour before meals and at bedtime.  Stay on Protonix 40 mg once daily in the morning. We have given you a FOD MAP DIET to follow.  Call and make an appointment to see Amy for 2-3 months out.  Her schedule is not our that far.  Call 2 weeks before the week  you want to come for an appointment.

## 2013-08-27 NOTE — Telephone Encounter (Signed)
Pt said she turned her monitor in last Monday,she wants to know the results please.

## 2013-08-27 NOTE — Telephone Encounter (Signed)
I think that I've reviewed and resulted

## 2013-08-28 NOTE — Progress Notes (Signed)
Reviewed and agree with management. Robert D. Kaplan, M.D., FACG  

## 2013-08-28 NOTE — Telephone Encounter (Signed)
Results released to mychart

## 2013-09-06 ENCOUNTER — Ambulatory Visit: Payer: Medicare Other | Admitting: Cardiovascular Disease

## 2013-09-07 ENCOUNTER — Ambulatory Visit (INDEPENDENT_AMBULATORY_CARE_PROVIDER_SITE_OTHER): Payer: Medicare Other | Admitting: Cardiovascular Disease

## 2013-09-07 ENCOUNTER — Encounter: Payer: Self-pay | Admitting: Cardiovascular Disease

## 2013-09-07 VITALS — BP 135/81 | HR 100 | Ht 63.5 in | Wt 275.4 lb

## 2013-09-07 DIAGNOSIS — I4891 Unspecified atrial fibrillation: Secondary | ICD-10-CM

## 2013-09-07 DIAGNOSIS — I480 Paroxysmal atrial fibrillation: Secondary | ICD-10-CM | POA: Insufficient documentation

## 2013-09-07 MED ORDER — DILTIAZEM HCL ER COATED BEADS 180 MG PO CP24: 180.0000 mg | ORAL_CAPSULE | Freq: Every day | ORAL | Status: DC

## 2013-09-07 NOTE — Assessment & Plan Note (Signed)
The monitor showed sinus rhythm with episodes of PAF.Her CHA2DS2VASC score is 3. I'm going to arrange for her to start a novel oral anticoagulant as well as Cardizem CD 180 mg a day. She does have normal LV function and normal coronary arteries and therefore may be a candidate for antiarrhythmic medication. I am referring her to Dr. Thompson Grayer for further evaluation.

## 2013-09-07 NOTE — Progress Notes (Signed)
09/07/2013 [name]   10/21/1945  [id number]  Primary Physician [name], MD Primary Cardiologist: Lorretta Harp MD [name]   HPI:  The patient is a 68 year old, severely overweight, married Caucasian female who was seen by Dr. Aldona Bar in the office on September 08, 2011, for chest pain. Her risk factors include hypertension and hyperlipidemia. She also has GERD. She was admitted for heart cath which I performed radially on September 10, 2011, that was essentially normal with normal LV function. Her 2D echo was normal as well. Her major complaints have been progressive dyspnea on exertion. She does have reactive airway disease and had pneumonia in the recent past as well. 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 She is seeing Dr. Halford Chessman for pulmonary evaluation for her back here for evaluation of sinus tachycardia. She really denies chest pain but does get dyspneic on exertion which was a complaint when I saw her 2 years ago as well. Recent thyroid function tests were normal. An event monitor showed sinus rhythm, sinus tachycardia with episodes of paroxysmal atrial fibrillation. Based on her age, is gender and history of hypertension she would be a candidate for oral anticoagulation. I am going to put her on a calcium channel blocker and arrange for her to see Dr. Genene Churn for further evaluation and treatment of hiatal fibrillation. She may be a candidate for a class IC drug    Current Outpatient Prescriptions  Medication Sig Dispense Refill  . albuterol (PROVENTIL HFA;VENTOLIN HFA) 108 (90 BASE) MCG/ACT inhaler Inhale 2 puffs into the lungs every 4 (four) hours as needed for wheezing or shortness of breath. Shortness of breath      . albuterol (PROVENTIL) (2.5 MG/3ML) 0.083% nebulizer solution Take 3 mLs (2.5 mg total) by nebulization every 6 (six) hours as needed for wheezing or shortness of  breath.  75 mL  12  . budesonide (PULMICORT) 0.5 MG/2ML nebulizer solution Take 2 mLs (0.5 mg total) by nebulization 2 (two) times daily.  120 mL  12  . clorazepate (TRANXENE) 7.5 MG tablet Take 7.5 mg by mouth as needed for anxiety.       . diclofenac (VOLTAREN) 75 MG EC tablet Take 75 mg by mouth daily.      [name] dicyclomine (BENTYL) 20 MG tablet Take 1 tab every 6 hours as needed  for cramping and spasms  90 tablet  3  . DULoxetine HCl (CYMBALTA PO) Take 1 tablet by mouth daily with supper.      . hydrochlorothiazide (MICROZIDE) 12.5 MG capsule Take 12.5 mg by mouth daily with breakfast.       . HYDROcodone-acetaminophen (NORCO) 7.5-325 MG per tablet Take 0.5 tablets by mouth every 6 (six) hours as needed for moderate pain.      [name] losartan (COZAAR) 50 MG tablet Take 50 mg by mouth 2 (two) times daily.       . metoCLOPramide (REGLAN) 10 MG tablet Take one tab one half-hour a.c. and at bedtime  360 tablet  3  . pantoprazole (PROTONIX) 40 MG tablet Take 40 mg by mouth daily.      . simvastatin (ZOCOR) 20 MG tablet Take 20 mg by mouth every evening.      . traMADol (ULTRAM) 50 MG tablet Take 1 tablet (50 mg total) by mouth every 6 (six) hours as needed.  40 tablet  0  . triamcinolone (  NASACORT ALLERGY 24HR) 55 MCG/ACT AERO nasal inhaler Place 2 sprays into the nose daily.  1 Inhaler  12  . diltiazem (CARDIZEM CD) 180 MG 24 hr capsule Take 1 capsule (180 mg total) by mouth daily.  30 capsule  11   No current facility-administered medications for this visit.    No Known Allergies  History   Social History  . Marital Status: Married    Spouse Name: [name]    Number of Children: [name]  . Years of Education: [name]   Occupational History  . retired    Social History Main Topics  . Smoking status: Passive Smoke Exposure - Never Smoker  . Smokeless tobacco: Never Used     Comment: as a teenager tried a couple times  . Alcohol Use: No  . Drug Use: No  . Sexual Activity: Yes   Other Topics Concern   . Not on file   Social History Narrative  . No narrative on file     Review of Systems: General: negative for chills, fever, night sweats or weight changes.  Cardiovascular: negative for chest pain, dyspnea on exertion, edema, orthopnea, palpitations, paroxysmal nocturnal dyspnea or shortness of breath Dermatological: negative for rash Respiratory: negative for cough or wheezing Urologic: negative for hematuria Abdominal: negative for nausea, vomiting, diarrhea, bright red blood per rectum, melena, or hematemesis Neurologic: negative for visual changes, syncope, or dizziness All other systems reviewed and are otherwise negative except as noted above.    Blood pressure 135/81, pulse 100, height 5' 3.5" (1.613 m), weight 275 lb 6.4 oz (124.921 kg).  General appearance: alert and no distress Neck: no adenopathy, no carotid bruit, no JVD, supple, symmetrical, trachea midline and thyroid not enlarged, symmetric, no tenderness/mass/nodules Lungs: clear to auscultation bilaterally Heart: regular rate and rhythm, S1, S2 normal, no murmur, click, rub or gallop Extremities: extremities normal, atraumatic, no cyanosis or edema  EKG not performed today  ASSESSMENT AND PLAN:   Paroxysmal atrial fibrillation The monitor showed sinus rhythm with episodes of PAF.Her CHA2DS2VASC score is 3. I'm going to arrange for her to start a novel oral anticoagulant as well as Cardizem CD 180 mg a day. She does have normal LV function and normal coronary arteries and therefore may be a candidate for antiarrhythmic medication. I am referring her to Dr. Thompson Grayer for further evaluation.      Lorretta Harp MD FACP,FACC,FAHA, [facility] 09/07/2013 5:41 PM

## 2013-09-07 NOTE — Patient Instructions (Signed)
Dr Gwenlyn Found has referred you to Dr Rayann Heman for discussion about your irregular heart rhythm (atrial fibrillation)  Dr Gwenlyn Found has also referred you to our pharmacist, Erasmo Downer, for discussion of anticoagulants.   Start Cardizem CD 180mg  daily  Dr Gwenlyn Found will see you back in 3 months.

## 2013-09-10 ENCOUNTER — Telehealth: Payer: Self-pay | Admitting: Pulmonary Disease

## 2013-09-10 DIAGNOSIS — J45998 Other asthma: Secondary | ICD-10-CM

## 2013-09-10 NOTE — Telephone Encounter (Signed)
Spoke with the pt  She states that Dr Gwenlyn Found had rec that we change albuterol nebs to xopenex nebs since she has dx of Afib  If this is okay with you we will need to send new rx to [facility]  Please advise, thanks!

## 2013-09-11 DIAGNOSIS — Z6841 Body Mass Index (BMI) 40.0 and over, adult: Secondary | ICD-10-CM | POA: Diagnosis not present

## 2013-09-11 DIAGNOSIS — E785 Hyperlipidemia, unspecified: Secondary | ICD-10-CM | POA: Diagnosis not present

## 2013-09-11 DIAGNOSIS — I10 Essential (primary) hypertension: Secondary | ICD-10-CM | POA: Diagnosis not present

## 2013-09-11 DIAGNOSIS — I4891 Unspecified atrial fibrillation: Secondary | ICD-10-CM | POA: Diagnosis not present

## 2013-09-11 DIAGNOSIS — M159 Polyosteoarthritis, unspecified: Secondary | ICD-10-CM | POA: Diagnosis not present

## 2013-09-11 DIAGNOSIS — E669 Obesity, unspecified: Secondary | ICD-10-CM | POA: Diagnosis not present

## 2013-09-11 DIAGNOSIS — R7301 Impaired fasting glucose: Secondary | ICD-10-CM | POA: Diagnosis not present

## 2013-09-11 DIAGNOSIS — F341 Dysthymic disorder: Secondary | ICD-10-CM | POA: Diagnosis not present

## 2013-09-11 MED ORDER — LEVALBUTEROL HCL 0.63 MG/3ML IN NEBU: 0.6300 mg | INHALATION_SOLUTION | Freq: Four times a day (QID) | RESPIRATORY_TRACT | Status: DC | PRN

## 2013-09-11 MED ORDER — LEVALBUTEROL TARTRATE 45 MCG/ACT IN AERO: 2.0000 | INHALATION_SPRAY | Freq: Four times a day (QID) | RESPIRATORY_TRACT | Status: DC | PRN

## 2013-09-11 NOTE — Telephone Encounter (Signed)
Per rhonda no DME will supply xopenex neb. This will need to go to local pharm. I caleld made pt aware. RX sent to [facility]. Nothing further needed

## 2013-09-11 NOTE — Telephone Encounter (Signed)
Okay to send order for xopenex HFA, dispense 1 inhaler with 5 refills.  Also send order for xopenex 0.63 mg nebulized q6h prn, dispense 30 day supply with 5 refills.

## 2013-09-11 NOTE — Telephone Encounter (Signed)
RX printed for VS to sign. Order placed. Spoke with pt. She would like RX sent to [facility]. Nothing further needed

## 2013-09-11 NOTE — Addendum Note (Signed)
Addended by: Inge Rise on: 09/11/2013 10:09 AM   Modules accepted: Orders

## 2013-09-12 ENCOUNTER — Encounter: Payer: Self-pay | Admitting: Internal Medicine

## 2013-09-12 ENCOUNTER — Ambulatory Visit (INDEPENDENT_AMBULATORY_CARE_PROVIDER_SITE_OTHER): Payer: Medicare Other | Admitting: Pharmacist Clinician (PhC)/ Clinical Pharmacy Specialist

## 2013-09-12 DIAGNOSIS — I4891 Unspecified atrial fibrillation: Secondary | ICD-10-CM | POA: Diagnosis not present

## 2013-09-12 DIAGNOSIS — Z5181 Encounter for therapeutic drug level monitoring: Secondary | ICD-10-CM | POA: Diagnosis not present

## 2013-09-12 MED ORDER — APIXABAN 5 MG PO TABS: 5.0000 mg | ORAL_TABLET | Freq: Two times a day (BID) | ORAL | Status: DC

## 2013-09-12 NOTE — Progress Notes (Signed)
Pt was started on Eliquis for atrial fibrillation on June 19.    Reviewed patients medication list.  Pt is not currently on any combined P-gp and strong CYP3A4 inhibitors/inducers (ketoconazole, traconazole, ritonavir, carbamazepine, phenytoin, rifampin, St. John's wort).  Reviewed labs.  SCr 1.1, Weight 275, CrCl- 97.8.  Dose appropriate based on CrCl.   Hgb and HCT Within Normal Limits  A full discussion of the nature of anticoagulants has been carried out.  A benefit/risk analysis has been presented to the patient, so that they understand the justification for choosing anticoagulation with Eliquis at this time.  The need for compliance is stressed.  Pt is aware to take the medication twice daily.  Side effects of potential bleeding are discussed, including unusual colored urine or stools, coughing up blood or coffee ground emesis, nose bleeds or serious fall or head trauma.  Discussed signs and symptoms of stroke. The patient should avoid any OTC items containing aspirin or ibuprofen.  Avoid alcohol consumption.   Call if any signs of abnormal bleeding.  Discussed financial obligations and resolved any difficulty in obtaining medication.  Next lab test test in 6 months.   Pt took first dose of diltiazem 180mg  on Saturday.  Complained of dizziness/diaphoresis/difficulty catching her breath.  Did not take Sunday or Monday am.  Saw her PCP - Dr. Brigitte Pulse on Monday.  He advised to d/c HCTZ 12.5mg  and restart diltiazem.  Pt did this Tuesday and today, felt fine.

## 2013-09-14 ENCOUNTER — Other Ambulatory Visit: Payer: Self-pay | Admitting: Pulmonary Disease

## 2013-09-14 MED ORDER — LEVALBUTEROL HCL 0.63 MG/3ML IN NEBU: 0.6300 mg | INHALATION_SOLUTION | Freq: Four times a day (QID) | RESPIRATORY_TRACT | Status: DC | PRN

## 2013-09-17 ENCOUNTER — Ambulatory Visit (HOSPITAL_BASED_OUTPATIENT_CLINIC_OR_DEPARTMENT_OTHER): Payer: Medicare Other | Attending: Pulmonary Disease | Admitting: Radiology

## 2013-09-17 ENCOUNTER — Telehealth: Payer: Self-pay | Admitting: Pulmonary Disease

## 2013-09-17 VITALS — Ht 63.0 in | Wt 268.0 lb

## 2013-09-17 DIAGNOSIS — Z79899 Other long term (current) drug therapy: Secondary | ICD-10-CM | POA: Diagnosis not present

## 2013-09-17 DIAGNOSIS — G4710 Hypersomnia, unspecified: Secondary | ICD-10-CM | POA: Insufficient documentation

## 2013-09-17 DIAGNOSIS — R0609 Other forms of dyspnea: Secondary | ICD-10-CM | POA: Diagnosis not present

## 2013-09-17 DIAGNOSIS — R000 Tachycardia, unspecified: Secondary | ICD-10-CM | POA: Diagnosis not present

## 2013-09-17 DIAGNOSIS — R0989 Other specified symptoms and signs involving the circulatory and respiratory systems: Secondary | ICD-10-CM | POA: Insufficient documentation

## 2013-09-17 DIAGNOSIS — G4733 Obstructive sleep apnea (adult) (pediatric): Secondary | ICD-10-CM

## 2013-09-17 DIAGNOSIS — J45909 Unspecified asthma, uncomplicated: Secondary | ICD-10-CM | POA: Insufficient documentation

## 2013-09-17 DIAGNOSIS — I10 Essential (primary) hypertension: Secondary | ICD-10-CM | POA: Insufficient documentation

## 2013-09-17 MED ORDER — LEVALBUTEROL HCL 0.63 MG/3ML IN NEBU: 0.6300 mg | INHALATION_SOLUTION | Freq: Four times a day (QID) | RESPIRATORY_TRACT | Status: DC | PRN

## 2013-09-17 NOTE — Telephone Encounter (Signed)
Pt states that she was told her Xopenex HFA inhaler she was already changed to was not at her pharmacy as well as her Xopenex 0.63% neb solution Rx was not at pharmacy. Looks as though Rx was sent electronically for [facility] inhaler and neb Rx was printed to have [facility] send to [facility]. Pt can not go through DME for her neb solution and therefore I have resent Rx electronically to pharmacy and they are aware its coming so they can get both HFA (was on file at pharmacy) and neb solution ready for pick up.    Pt was made aware of the above and will pick both Rx's up at pharmacy tomorrow after she completes her sleep study tonight. Pt is also aware that (per pharmacy) her neb solution will be filed under her Medicare Part B.   Nothing more needed at this time.

## 2013-09-19 ENCOUNTER — Telehealth: Payer: Self-pay | Admitting: Pulmonary Disease

## 2013-09-19 DIAGNOSIS — G4730 Sleep apnea, unspecified: Secondary | ICD-10-CM

## 2013-09-19 DIAGNOSIS — G4710 Hypersomnia, unspecified: Secondary | ICD-10-CM | POA: Diagnosis not present

## 2013-09-19 NOTE — Sleep Study (Signed)
[name]  NAME: [name] DATE OF BIRTH:  [birth year] MEDICAL RECORD NUMBER [id number]  LOCATION: [facility]  PHYSICIAN: Chesley Mires, M.D. DATE OF STUDY: 09/17/2013  SLEEP STUDY TYPE: Polysomnogram               REFERRING PHYSICIAN: Chesley Mires, MD  INDICATION FOR STUDY:  [name] is a 68 y.o. female who presents to the sleep lab for evaluation of hypersomnia with obstructive sleep apnea.  She reports snoring, sleep disruption, apnea, and daytime sleepiness.  She has history of hypertension, tachycardia and asthma.  EPWORTH SLEEPINESS SCORE: 4. HEIGHT: 5\' 3"  (160 cm)  WEIGHT: 268 lb (121.564 kg)    Body mass index is 47.49 kg/(m^2).  NECK SIZE: 16.5 in.  MEDICATIONS:  Current Outpatient Prescriptions on File Prior to Visit  Medication Sig Dispense Refill  . apixaban (ELIQUIS) 5 MG TABS tablet Take 1 tablet (5 mg total) by mouth 2 (two) times daily.  60 tablet  5  . budesonide (PULMICORT) 0.5 MG/2ML nebulizer solution Take 2 mLs (0.5 mg total) by nebulization 2 (two) times daily.  120 mL  12  . clorazepate (TRANXENE) 7.5 MG tablet Take 7.5 mg by mouth as needed for anxiety.       . diclofenac (VOLTAREN) 75 MG EC tablet Take 75 mg by mouth daily.      [name] dicyclomine (BENTYL) 20 MG tablet Take 1 tab every 6 hours as needed  for cramping and spasms  90 tablet  3  . diltiazem (CARDIZEM CD) 180 MG 24 hr capsule Take 1 capsule (180 mg total) by mouth daily.  30 capsule  11  . DULoxetine HCl (CYMBALTA PO) Take 1 tablet by mouth daily with supper.      [name] HYDROcodone-acetaminophen (NORCO) 7.5-325 MG per tablet Take 0.5 tablets by mouth every 6 (six) hours as needed for moderate pain.      [name] levalbuterol (XOPENEX HFA) 45 MCG/ACT inhaler Inhale 2 puffs into the lungs every 6 (six) hours as needed for wheezing.  1 Inhaler  5  . losartan (COZAAR) 50 MG tablet Take 50 mg by mouth 2 (two) times daily.       . metoCLOPramide (REGLAN) 10 MG  tablet Take one tab one half-hour a.c. and at bedtime  360 tablet  3  . pantoprazole (PROTONIX) 40 MG tablet Take 40 mg by mouth daily.      . simvastatin (ZOCOR) 20 MG tablet Take 20 mg by mouth every evening.      . traMADol (ULTRAM) 50 MG tablet Take 1 tablet (50 mg total) by mouth every 6 (six) hours as needed.  40 tablet  0  . triamcinolone (NASACORT ALLERGY 24HR) 55 MCG/ACT AERO nasal inhaler Place 2 sprays into the nose daily.  1 Inhaler  12   No current facility-administered medications on file prior to visit.    SLEEP ARCHITECTURE:  Total recording time: 385.5 minutes.  Total sleep time was: 253 minutes.  Sleep efficiency: 65.6%.  Sleep latency: 63 minutes.  REM latency: 316.5 minutes.  Stage N1: 5.5%.  Stage N2: 93.7%.  Stage N3: 0%.  Stage R:  0.8%.  Supine sleep: 116.5 minutes.  Non-supine sleep: 136.5 minutes.  CARDIAC DATA:  Average heart rate: 75 beats per minute. Rhythm strip: sinus rhythm with PAC's and PVC's.  RESPIRATORY DATA: Average respiratory rate: 14. Snoring: loud. Average AHI: 3.8.   Apnea index: 0.7.  Hypopnea index: 3.1. Obstructive apnea index:  0.7.  Central apnea index: 0.  Mixed apnea index: 0. REM AHI: 0.  NREM AHI: 3.8. Supine AHI: 2.1. Non-supine AHI: 2.7.  MOVEMENT/PARASOMNIA:  Periodic limb movement: 0.9.  Period limb movements with arousals: 0.2. Restroom trips: two.  OXYGEN DATA:  Baseline oxygenation: 92%. Lowest SaO2: 85%. Time spent below SaO2 90%: 3.3 minutes. Supplemental oxygen used: none.  IMPRESSION/ RECOMMENDATION:   While this study showed several obstructive respiratory events, these were not sufficient to diagnose obstructive sleep apnea.  If there is strong clinical suspicion for sleep apnea, then the patient could be referred back to sleep lab for additional sleep testing.   Weight loss options should also be reviewed.  Chesley Mires, M.D. Diplomate, [employer] of Sleep Medicine  ELECTRONICALLY SIGNED ON:  09/19/2013,  11:05 AM [place] PH: [phone]   FX: [phone] [place]

## 2013-09-19 NOTE — Telephone Encounter (Signed)
PSG 09/17/13 >> AHI 3.8, SaO2 low 85%  Will have my nurse schedule ROV to review results.

## 2013-09-25 NOTE — Telephone Encounter (Signed)
ATC x 2 Phone busy wcb

## 2013-09-26 ENCOUNTER — Telehealth: Payer: Self-pay | Admitting: Cardiovascular Disease

## 2013-09-26 NOTE — Telephone Encounter (Signed)
[facility] requested cardionet results.  Results faxed to [facility]

## 2013-09-27 DIAGNOSIS — M1610 Unilateral primary osteoarthritis, unspecified hip: Secondary | ICD-10-CM | POA: Diagnosis not present

## 2013-09-27 DIAGNOSIS — M25559 Pain in unspecified hip: Secondary | ICD-10-CM | POA: Diagnosis not present

## 2013-09-27 DIAGNOSIS — M169 Osteoarthritis of hip, unspecified: Secondary | ICD-10-CM | POA: Diagnosis not present

## 2013-09-27 DIAGNOSIS — M259 Joint disorder, unspecified: Secondary | ICD-10-CM | POA: Diagnosis not present

## 2013-09-27 DIAGNOSIS — Z6841 Body Mass Index (BMI) 40.0 and over, adult: Secondary | ICD-10-CM | POA: Diagnosis not present

## 2013-09-28 NOTE — Telephone Encounter (Signed)
Pt scheduled for ROV to discuss results Scheduled for 10/03/13 at 1:45 with VS Nothing further needed.

## 2013-10-03 ENCOUNTER — Ambulatory Visit (INDEPENDENT_AMBULATORY_CARE_PROVIDER_SITE_OTHER): Payer: Medicare Other | Admitting: Pulmonary Disease

## 2013-10-03 ENCOUNTER — Institutional Professional Consult (permissible substitution): Payer: Medicare Other | Admitting: Internal Medicine

## 2013-10-03 ENCOUNTER — Encounter: Payer: Self-pay | Admitting: Pulmonary Disease

## 2013-10-03 VITALS — BP 124/86 | HR 91 | Temp 98.0°F | Ht 63.5 in | Wt 273.0 lb

## 2013-10-03 DIAGNOSIS — R05 Cough: Secondary | ICD-10-CM

## 2013-10-03 DIAGNOSIS — J45909 Unspecified asthma, uncomplicated: Secondary | ICD-10-CM

## 2013-10-03 DIAGNOSIS — R0683 Snoring: Secondary | ICD-10-CM

## 2013-10-03 DIAGNOSIS — R490 Dysphonia: Secondary | ICD-10-CM | POA: Diagnosis not present

## 2013-10-03 DIAGNOSIS — R059 Cough, unspecified: Secondary | ICD-10-CM

## 2013-10-03 DIAGNOSIS — R0609 Other forms of dyspnea: Secondary | ICD-10-CM | POA: Diagnosis not present

## 2013-10-03 DIAGNOSIS — R0989 Other specified symptoms and signs involving the circulatory and respiratory systems: Secondary | ICD-10-CM | POA: Diagnosis not present

## 2013-10-03 DIAGNOSIS — J45998 Other asthma: Secondary | ICD-10-CM

## 2013-10-03 DIAGNOSIS — R058 Other specified cough: Secondary | ICD-10-CM

## 2013-10-03 NOTE — Progress Notes (Signed)
Chief Complaint  Patient presents with  . Follow-up    Review sleep study    History of Present Illness: [name] is a 68 y.o. female never smoker with dyspnea likely related to vocal cord dysfunction, deconditioning, upper airway cough syndrome, and asthma.  She is here to review her sleep study from June 29.  This showed an AHI of only 3.8.  Since her last visit she was diagnosed with atrial fibrillation.  She has been started on cardizem and eliquis.    Her cough and hoarse have improved.  She continues with exercises from speech therapy and these have helped.    She still gets dry mouth at times.    She is trying to lose weight.  She gets sore in her mouth occasionally.  TESTS: CT chest 10/07/11 >> mild mosaic attenuation, coronary atherosclerosis PFT 08/03/13 >> FEV1 2.27 (100%), FEV1% 84, TLC 4.51 (92%), DLCO 88%, +BD from FEF 25-75 PSG 09/17/13 >> AHI 3.8, SaO2 low 85%  [name]  has a past medical history of Asthma; Arthritis; Hypertension; Hypercholesteremia; Unstable angina (09/09/2011); HTN (hypertension) (09/09/2011); Hyperlipemia (09/09/2011); GERD (gastroesophageal reflux disease) (09/09/2011); Anxiety; Hiatal hernia; Diverticulosis; Antral polyp; IBS (irritable bowel syndrome); Obesity; Internal hemorrhoid; Vocal cord dysfunction (2013); PONV (postoperative nausea and vomiting); Shortness of breath; Tachycardia; and Paroxysmal atrial fibrillation.  [name]  has past surgical history that includes Total knee arthroplasty (Bilateral); Cholecystectomy; Abdominal hysterectomy (1988); Tumor excision (1964); Shoulder surgery (Right, 1987); Wrist surgery (Left, 1997, 2009 ); Elbow surgery (Left); Foot osteotomy w/ plantar fascia release (Left); Total ankle replacement (Right); hip ilioban release (Right, 1997); Total ankle replacement (Right, 2012); Ankle Fusion (Right); thumb replacement (Bilateral); Back surgery [phone]); Nasal sinus surgery (1997);  Esophagogastroduodenoscopy ([name], 03/06/2013); BRAVO ph study ([name], 03/06/2013); and tfc wrist (Left, 1997).  Prior to Admission medications   Medication Sig Start Date End Date Taking? Authorizing Provider  acetaminophen-codeine (TYLENOL #3) 300-30 MG per tablet Take 1 tablet by mouth every 4 (four) hours as needed for moderate pain or severe pain.    Yes Historical Provider, MD  albuterol (PROVENTIL HFA;VENTOLIN HFA) 108 (90 BASE) MCG/ACT inhaler Inhale 2 puffs into the lungs every 4 (four) hours as needed for wheezing or shortness of breath. Shortness of breath   Yes Historical Provider, MD  beclomethasone (QVAR) 40 MCG/ACT inhaler Inhale 2 puffs into the lungs 2 (two) times daily.   Yes Historical Provider, MD  clorazepate (TRANXENE) 7.5 MG tablet Take 7.5 mg by mouth as needed for anxiety.    Yes Historical Provider, MD  diclofenac (VOLTAREN) 75 MG EC tablet Take 75 mg by mouth daily.   Yes Historical Provider, MD  dicyclomine (BENTYL) 20 MG tablet Take 1 tab every 6 hours as needed  for cramping and spasms 06/26/13  Yes Amy S Esterwood, PA-C  DM-Doxylamine-Acetaminophen (DELSYM NIGHT TIME MULTI-SYMPT) 15-6.25-325 MG/15ML LIQD Take 30 mLs by mouth at bedtime as needed (cough). 06/01/13  Yes Chesley Mires, MD  DULoxetine HCl (CYMBALTA PO) Take 1 tablet by mouth daily with supper.   Yes Historical Provider, MD  hydrochlorothiazide (MICROZIDE) 12.5 MG capsule Take 12.5 mg by mouth daily with breakfast.    Yes Historical Provider, MD  losartan (COZAAR) 50 MG tablet Take 50 mg by mouth 2 (two) times daily.    Yes Historical Provider, MD  metroNIDAZOLE (FLAGYL) 250 MG tablet Take 1 tablet (250 mg total) by mouth 4 (four) times daily. 06/26/13 07/06/13 Yes Amy S Esterwood, PA-C  ondansetron (  ZOFRAN) 4 MG tablet Take 1 tablet (4 mg total) by mouth every 8 (eight) hours as needed for nausea or vomiting. 06/26/13  Yes Amy S Esterwood, PA-C  pantoprazole (PROTONIX) 40 MG tablet Take 1 tablet (40 mg total) by mouth 2  (two) times daily. 04/05/13  Yes Inda Castle, MD  simvastatin (ZOCOR) 20 MG tablet Take 20 mg by mouth every evening.   Yes Historical Provider, MD  sucralfate (CARAFATE) 1 G tablet Take 1 tablet (1 g total) by mouth 4 (four) times daily -  with meals and at bedtime. 04/05/13  Yes Inda Castle, MD  traMADol (ULTRAM) 50 MG tablet Take 1 tablet (50 mg total) by mouth every 6 (six) hours as needed. 06/26/13  Yes Amy S Esterwood, PA-C  triamcinolone (NASACORT ALLERGY 24HR) 55 MCG/ACT AERO nasal inhaler Place 2 sprays into the nose daily. 06/01/13  Yes Chesley Mires, MD    No Known Allergies   Physical Exam:  General - No distress, improved voice quality ENT - No sinus tenderness, no oral exudate, no LAN Cardiac - s1s2 regular, no murmur Chest - No wheeze/rales/dullness Back - No focal tenderness Abd - Soft, non-tender Ext - No edema Neuro - Normal strength Skin - No rashes Psych - normal mood, and behavior  Assessment/Plan:  Chesley Mires, MD [place] [facility] Pager:  [phone]

## 2013-10-03 NOTE — Patient Instructions (Signed)
Follow up in 6 months 

## 2013-10-05 DIAGNOSIS — D239 Other benign neoplasm of skin, unspecified: Secondary | ICD-10-CM | POA: Diagnosis not present

## 2013-10-05 DIAGNOSIS — D1801 Hemangioma of skin and subcutaneous tissue: Secondary | ICD-10-CM | POA: Diagnosis not present

## 2013-10-05 DIAGNOSIS — D2370 Other benign neoplasm of skin of unspecified lower limb, including hip: Secondary | ICD-10-CM | POA: Diagnosis not present

## 2013-10-05 DIAGNOSIS — L821 Other seborrheic keratosis: Secondary | ICD-10-CM | POA: Diagnosis not present

## 2013-10-05 DIAGNOSIS — L820 Inflamed seborrheic keratosis: Secondary | ICD-10-CM | POA: Diagnosis not present

## 2013-10-07 ENCOUNTER — Encounter: Payer: Self-pay | Admitting: Pulmonary Disease

## 2013-10-07 NOTE — Assessment & Plan Note (Addendum)
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  Improved.

## 2013-10-07 NOTE — Assessment & Plan Note (Signed)
Improved since last visit.  She is to continue with exercises from speech therapy.

## 2013-10-07 NOTE — Assessment & Plan Note (Signed)
Her sleep study showed a few respiratory events, but not significant enough to qualify for dx of sleep apnea.  Encouraged her to continue with her weight loss efforts.

## 2013-10-07 NOTE — Assessment & Plan Note (Addendum)
Continue scheduled budesonide with prn xopenex for now.  Will re-assess her asthma regimen at next visit >> if she continues to improve, then could consider stepping down her regimen.

## 2013-10-08 ENCOUNTER — Telehealth: Payer: Self-pay | Admitting: Physician Assistant

## 2013-10-08 MED ORDER — RESTORA PO CAPS: 1.0000 | ORAL_CAPSULE | Freq: Every day | ORAL | Status: DC

## 2013-10-08 NOTE — Telephone Encounter (Signed)
I sent a prescription to [facility], [place], [address]. I called the patient and she asked me to send it to [facility], [place].  I called it in to a pharmacist there.

## 2013-10-17 ENCOUNTER — Ambulatory Visit (INDEPENDENT_AMBULATORY_CARE_PROVIDER_SITE_OTHER): Payer: Medicare Other | Admitting: Internal Medicine

## 2013-10-17 ENCOUNTER — Encounter: Payer: Self-pay | Admitting: Internal Medicine

## 2013-10-17 VITALS — BP 118/69 | HR 83 | Ht 63.5 in | Wt 273.0 lb

## 2013-10-17 DIAGNOSIS — I4891 Unspecified atrial fibrillation: Secondary | ICD-10-CM

## 2013-10-17 DIAGNOSIS — Z6841 Body Mass Index (BMI) 40.0 and over, adult: Secondary | ICD-10-CM

## 2013-10-17 DIAGNOSIS — I10 Essential (primary) hypertension: Secondary | ICD-10-CM | POA: Diagnosis not present

## 2013-10-17 DIAGNOSIS — I480 Paroxysmal atrial fibrillation: Secondary | ICD-10-CM

## 2013-10-17 NOTE — Patient Instructions (Signed)
Your physician recommends that you schedule a follow-up appointment in: 3 months in the afib clinic with Roderic Palau, NP   Your physician has requested that you have an echocardiogram. Echocardiography is a painless test that uses sound waves to create images of your heart. It provides your doctor with information about the size and shape of your heart and how well your heart's chambers and valves are working. This procedure takes approximately one hour. There are no restrictions for this procedure.

## 2013-10-17 NOTE — Progress Notes (Signed)
Primary Care Physician: [name], MD Primary Cardiologist:Dr. Quay Burow Primary Electrophysiologist: Dr. Thompson Grayer Referring Physician: Dr. Quay Burow   [name] is a 68 y.o. female with a h/o morbid obesity paroxysmal atrial fibrillation who presents for consultation in the [facility].  The patient was initially diagnosed with atrial fibrillation  after presenting with symptoms of shortness of breath with exertion, diaphoresis. Patient was evaluated in the vocal cord dysfunction clinic at [facility] and it was felt that her shortness of breath was out of proportion  to her level of dysfunction. She was encouraged to see a cardiologist and pulmonologist. She does have history of asthma and her inhaler was changed which patient thinks may be contributing to less shortness of breath. She saw Dr. Gwenlyn Found for a Holter monitor which did reveal some nonsustained episodes of A. Fib, as well as SVT.   Chads 2 vas score of at least  3 and has been started on Eliquis which she is tolerating. She was also started on diltiazem which she feels has made a significant improvement in her symptoms, now with less shortness of breath very little diaphoresis with exertion. She was never aware of any heart racing or palpitations. Had undergone a sleep study which was borderline for the need for cpap but just 2 nights ago went through further testing with pulse oximetry for which the results are not known. Patient currently is seeing a nutritionist for weight loss she did have hip surgery. She has lost 11 pounds. Her goal is to BMI less than 40 currently is 47.6.   Today, she denies symptoms of palpitations, chest pain, shortness of breath, orthopnea, PND, lower extremity edema, dizziness, presyncope, syncope, snoring, daytime somnolence, bleeding, or neurologic sequela. The patient is tolerating medications without difficulties and is otherwise without complaint today.     Atrial Fibrillation Risk Factors:  she does have symptoms or diagnosis of sleep apnea with borderline sleep apnea, not requiring CPAP, recently undergoing pulse oximetry overnight.    she does not have a history of rheumatic fever.  she does not have a history of alcohol use.  she has a BMI of Body mass index is 47.6 kg/(m^2).[name] Filed Weights   10/17/13 1124  Weight: 123.832 kg (273 lb)    LA size: echo pending, reported normal in 2013. Cardiac catheterization also reported as normal in 2013. Atrial Fibrillation Management history:  Previous antiarrhythmic drugs: none  Previous cardioversions: none  Previous ablations: none  CHADS2VASC score: at least 3  Anticoagulation history: Eliquis 5 mg bid   Past Medical History  Diagnosis Date  . Asthma   . Arthritis   . Hypertension   . Hypercholesteremia   . Unstable angina 09/09/2011  . HTN (hypertension) 09/09/2011  . Hyperlipemia 09/09/2011  . GERD (gastroesophageal reflux disease) 09/09/2011  . Anxiety   . Hiatal hernia   . Diverticulosis   . Antral polyp     benign  . IBS (irritable bowel syndrome)   . Obesity   . Internal hemorrhoid   . Vocal cord dysfunction 2013    swelling  . PONV (postoperative nausea and vomiting)     with big surgeries, none with endos etc  . Shortness of breath   . Tachycardia   . Paroxysmal atrial fibrillation    Past Surgical History  Procedure Laterality Date  . Total knee arthroplasty Bilateral     left 2002, right 2012  . Cholecystectomy    . Abdominal  hysterectomy  1988  . Tumor excision  1964    rt. leg fatty tumor  . Shoulder surgery Right 1987  . Wrist surgery Left 1997, 2009   . Elbow surgery Left     removed bone chip  . Foot osteotomy w/ plantar fascia release Left   . Total ankle replacement Right     x 3  . Hip ilioban release Right 1997  . Total ankle replacement Right 2012  . Ankle fusion Right     x 2  . Thumb replacement Bilateral     one 2009 and one  2014  . Back surgery  [id number]    3 ruptured discs, lower back  . Nasal sinus surgery  1997  . Esophagogastroduodenoscopy [name] 03/06/2013    Procedure: ESOPHAGOGASTRODUODENOSCOPY (EGD);  Surgeon: Inda Castle, MD;  Location: [facility];  Service: Endoscopy;  Laterality: [name];  . Bravo ph study [name] 03/06/2013    Procedure: BRAVO Rowan STUDY;  Surgeon: Inda Castle, MD;  Location: [facility];  Service: Endoscopy;  Laterality: [name];  . Tfc wrist Left 1997    Current Outpatient Prescriptions  Medication Sig Dispense Refill  . apixaban (ELIQUIS) 5 MG TABS tablet Take 1 tablet (5 mg total) by mouth 2 (two) times daily.  60 tablet  5  . budesonide (PULMICORT) 0.5 MG/2ML nebulizer solution Take 2 mLs (0.5 mg total) by nebulization 2 (two) times daily.  120 mL  12  . Cholecalciferol (VITAMIN D3) 5000 UNITS CAPS Take 1 tablet by mouth at bedtime.      . clorazepate (TRANXENE) 7.5 MG tablet Take 7.5 mg by mouth as needed for anxiety.       . diclofenac (VOLTAREN) 75 MG EC tablet Take 75 mg by mouth daily.      [name] diltiazem (CARDIZEM CD) 180 MG 24 hr capsule Take 1 capsule (180 mg total) by mouth daily.  30 capsule  11  . DULoxetine HCl (CYMBALTA PO) Take 1 tablet by mouth daily with supper.      [name] HYDROcodone-acetaminophen (NORCO) 7.5-325 MG per tablet Take 1 tablet by mouth every 6 (six) hours as needed for moderate pain.       [name] losartan (COZAAR) 50 MG tablet Take 50 mg by mouth 2 (two) times daily.       . metoCLOPramide (REGLAN) 10 MG tablet Take one tablet one half-hour before meals & 1 tablet at bedtime      . pantoprazole (PROTONIX) 40 MG tablet Take 40 mg by mouth daily.      [name] PROAIR HFA 108 (90 BASE) MCG/ACT inhaler Inhale 2 puffs into the lungs 2 (two) times daily as needed.      . Probiotic Product (RESTORA) CAPS Take 1 capsule by mouth daily.  30 capsule  2  . simvastatin (ZOCOR) 20 MG tablet Take 20 mg by mouth every evening.      . triamcinolone (NASACORT) 55 MCG/ACT AERO  nasal inhaler Place 1 spray into the nose daily.       No current facility-administered medications for this visit.    No Known Allergies  History   Social History  . Marital Status: Married    Spouse Name: [name]    Number of Children: [name]  . Years of Education: [name]   Occupational History  . retired    Social History Main Topics  . Smoking status: Passive Smoke Exposure - Never Smoker  . Smokeless tobacco: Never Used     Comment: as a  teenager tried a couple times  . Alcohol Use: No  . Drug Use: No  . Sexual Activity: Yes   Other Topics Concern  . Not on file   Social History Narrative  . No narrative on file    Family History  Problem Relation Age of Onset  . Hypertension Mother   . Esophageal cancer Father 67  . Coronary artery disease Neg Hx   . Diabetes Mother   . Arthritis Mother   . Colon cancer Neg Hx   . Esophageal cancer Mother   . Liver disease Neg Hx   . Kidney disease Neg Hx   . Other Sister     kidney mass removed  . Heart disease Maternal Grandmother    The patient does not have a history of early familial atrial fibrillation or other arrhythmias.  ROS- All systems are reviewed and negative except as per the HPI above.  Physical Exam: Filed Vitals:   10/17/13 1124  BP: 118/69  Pulse: 83  Height: 5' 3.5" (1.613 m)  Weight: 123.832 kg (273 lb)    GEN- The patient is well appearing, alert and oriented x 3 today.   Head- normocephalic, atraumatic Eyes-  Sclera clear, conjunctiva pink Ears- hearing intact Oropharynx- clear Neck- supple, no JVP Lymph- no cervical lymphadenopathy Lungs- Clear to ausculation bilaterally, normal work of breathing Heart- Regular rate and rhythm, no murmurs, rubs or gallops, PMI not laterally displaced GI- soft, NT, ND, + BS Extremities- no clubbing, cyanosis, or edema MS- no significant deformity or atrophy Skin- no rash or lesion Psych- euthymic mood, full affect Neuro- strength and sensation are  intact  EKG sinus rhythm at 83 bpm Echo pending Holter monitor revealed afib of short duration without any significant burden, as well as nonsustained episodes of SVT.  Epic records are reviewed at length today.  Assessment and Plan:  1. Atrial fibrillation The patient has Asymptomatic paroxysmal  fibrillation.  The patients CHAD2VASC score is  at least 3.  she is  appropriately anticoagulated at this time. The patient is adequately rate controlled with ditiazem, with significant reduction is symptoms. For now, continue with rate control and reserve  antiarrhythmics if afib burden significantly increases and patient becomes symptomatic.   The patients left atrial size is  unknown.  Echo pending. A long discussion with the patient was had today regarding therapeutic strategies.  Extensive discussion of lifestyle modification, including weight loss, treatment of any sleep disorders was also discussed.   Presently, our recommendations include -  2. Morbid obesity As above, lifestyle modification was discussed at length including regular exercise and weight reduction. She  has already lost 11 pounds seeing a local nutritionist who helped her lose 130 lbs in the past.   3. Obstructive sleep apnea The importance of adequate treatment of sleep apnea was discussed today in order to improve our ability to maintain sinus rhythm long term. She was encouraged to continue with current testing.   4. See back in afib clinic in 3 months.   Roderic Palau NP  Nurse Practitioner, [facility] 10/17/2013 12:23 PM   I have seen, examined the patient, and reviewed the above assessment and plan.  Changes to above are made where necessary.  The patient has minimal symptoms presently.  She does have significant lifestyle modification measures required.  She will follow with Roderic Palau in the afib clinic going forward.  I am happy to see her in the future should she have  additional  issues with her rhythm.  Co Sign: Thompson Grayer, MD 10/22/2013 9:17 AM

## 2013-10-18 LAB — PULMONARY FUNCTION TEST
DL/VA % pred: 102 %
DL/VA: 4.79 ml/min/mmHg/L
DLCO UNC % PRED: 88 %
DLCO UNC: 20.38 ml/min/mmHg
FEF 25-75 Post: 2.96 L/sec
FEF 25-75 Pre: 2.38 L/sec
FEF2575-%Change-Post: 24 %
FEF2575-%Pred-Post: 149 %
FEF2575-%Pred-Pre: 120 %
FEV1-%Change-Post: 4 %
FEV1-%Pred-Post: 100 %
FEV1-%Pred-Pre: 96 %
FEV1-POST: 2.27 L
FEV1-Pre: 2.17 L
FEV1FVC-%CHANGE-POST: 1 %
FEV1FVC-%Pred-Pre: 108 %
FEV6-%Change-Post: 3 %
FEV6-%PRED-PRE: 91 %
FEV6-%Pred-Post: 94 %
FEV6-PRE: 2.6 L
FEV6-Post: 2.69 L
FEV6FVC-%PRED-POST: 104 %
FEV6FVC-%PRED-PRE: 104 %
FVC-%Change-Post: 3 %
FVC-%PRED-POST: 90 %
FVC-%PRED-PRE: 87 %
FVC-PRE: 2.6 L
FVC-Post: 2.69 L
PRE FEV6/FVC RATIO: 100 %
Post FEV1/FVC ratio: 84 %
Post FEV6/FVC ratio: 100 %
Pre FEV1/FVC ratio: 83 %
RV % PRED: 89 %
RV: 1.87 L
TLC % PRED: 92 %
TLC: 4.51 L

## 2013-10-26 ENCOUNTER — Ambulatory Visit (HOSPITAL_COMMUNITY): Payer: Medicare Other | Attending: Internal Medicine | Admitting: Cardiology

## 2013-10-26 DIAGNOSIS — R0989 Other specified symptoms and signs involving the circulatory and respiratory systems: Secondary | ICD-10-CM | POA: Insufficient documentation

## 2013-10-26 DIAGNOSIS — I359 Nonrheumatic aortic valve disorder, unspecified: Secondary | ICD-10-CM | POA: Insufficient documentation

## 2013-10-26 DIAGNOSIS — R0609 Other forms of dyspnea: Secondary | ICD-10-CM | POA: Diagnosis not present

## 2013-10-26 DIAGNOSIS — R0602 Shortness of breath: Secondary | ICD-10-CM

## 2013-10-26 DIAGNOSIS — I4891 Unspecified atrial fibrillation: Secondary | ICD-10-CM | POA: Insufficient documentation

## 2013-10-26 DIAGNOSIS — I480 Paroxysmal atrial fibrillation: Secondary | ICD-10-CM

## 2013-10-26 DIAGNOSIS — I059 Rheumatic mitral valve disease, unspecified: Secondary | ICD-10-CM | POA: Insufficient documentation

## 2013-10-26 NOTE — Progress Notes (Signed)
Echo performed. 

## 2013-10-30 DIAGNOSIS — M25569 Pain in unspecified knee: Secondary | ICD-10-CM | POA: Diagnosis not present

## 2013-11-14 DIAGNOSIS — M25559 Pain in unspecified hip: Secondary | ICD-10-CM | POA: Diagnosis not present

## 2013-12-11 ENCOUNTER — Encounter: Payer: Self-pay | Admitting: Cardiovascular Disease

## 2013-12-11 ENCOUNTER — Ambulatory Visit (INDEPENDENT_AMBULATORY_CARE_PROVIDER_SITE_OTHER): Payer: Medicare Other | Admitting: Cardiovascular Disease

## 2013-12-11 VITALS — BP 149/94 | HR 100 | Ht 63.5 in | Wt 272.0 lb

## 2013-12-11 DIAGNOSIS — E785 Hyperlipidemia, unspecified: Secondary | ICD-10-CM

## 2013-12-11 DIAGNOSIS — I10 Essential (primary) hypertension: Secondary | ICD-10-CM

## 2013-12-11 DIAGNOSIS — I480 Paroxysmal atrial fibrillation: Secondary | ICD-10-CM

## 2013-12-11 DIAGNOSIS — I4891 Unspecified atrial fibrillation: Secondary | ICD-10-CM | POA: Diagnosis not present

## 2013-12-11 MED ORDER — DILTIAZEM HCL ER COATED BEADS 180 MG PO CP24: 180.0000 mg | ORAL_CAPSULE | Freq: Every day | ORAL | Status: DC

## 2013-12-11 MED ORDER — APIXABAN 5 MG PO TABS: 5.0000 mg | ORAL_TABLET | Freq: Two times a day (BID) | ORAL | Status: DC

## 2013-12-11 NOTE — Assessment & Plan Note (Signed)
On statin therapy followed by her PCP 

## 2013-12-11 NOTE — Patient Instructions (Signed)
Your physician wants you to follow-up in: 1 year with Dr Berry. You will receive a reminder letter in the mail two months in advance. If you don't receive a letter, please call our office to schedule the follow-up appointment.  

## 2013-12-11 NOTE — Assessment & Plan Note (Signed)
On rate lowering drugs and eliquis anticoagulation with a CHA2DS2VASC score of 3. She did see Dr. Rayann Heman in the office for evaluation.2-D echocardiogram revealed normal LV function and normal left atrial size

## 2013-12-11 NOTE — Progress Notes (Signed)
12/11/2013 [name]   02-15-1946  [id number]  Primary Physician [name], MD Primary Cardiologist: Lorretta Harp MD [name]   HPI:  The patient is a 68 year old, severely overweight, married Caucasian female who was seen by Dr. Aldona Bar in the office on September 08, 2011, for chest pain. I last saw her in the office 09/07/13.Her risk factors include hypertension and hyperlipidemia. She also has GERD. She was admitted for heart cath which I performed radially on September 10, 2011, that was essentially normal with normal LV function. Her 2D echo was normal as well. Her major complaints have been progressive dyspnea on exertion. She does have reactive airway disease and had pneumonia in the recent past as well. I saw her last 7-10/13 she has been evaluated at [facility] and was told she had vocal cord dysfunction. She is seeing Dr. Halford Chessman for pulmonary evaluation for her back here for evaluation of sinus tachycardia. She really denies chest pain but does get dyspneic on exertion which was a complaint when I saw her 2 years ago as well. Recent thyroid function tests were normal. An event monitor showed sinus rhythm, sinus tachycardia with episodes of paroxysmal atrial fibrillation. Based on her age, is gender and history of hypertension she would be a candidate for oral anticoagulation. I am going to put her on a calcium channel blocker and arrange for her to see Dr. Rayann Heman for further evaluation and treatment of her atrial fibrillation. Because of a CHA2DS2VASC score of 3, she was begun on Eiquis oral anticoagulations.  Current Outpatient Prescriptions  Medication Sig Dispense Refill  . apixaban (ELIQUIS) 5 MG TABS tablet Take 1 tablet (5 mg total) by mouth 2 (two) times daily.  180 tablet  3  . budesonide (PULMICORT) 0.5 MG/2ML nebulizer solution Take 2 mLs (0.5 mg total) by nebulization 2 (two) times daily.  120 mL  12  . Cholecalciferol (VITAMIN D3) 5000  UNITS CAPS Take 1 tablet by mouth at bedtime.      . clorazepate (TRANXENE) 7.5 MG tablet Take 7.5 mg by mouth as needed for anxiety.       . diclofenac (VOLTAREN) 75 MG EC tablet Take 75 mg by mouth daily.      [name] diltiazem (CARDIZEM CD) 180 MG 24 hr capsule Take 1 capsule (180 mg total) by mouth daily.  90 capsule  3  . DULoxetine HCl (CYMBALTA PO) Take 1 tablet by mouth daily with supper.      [name] HYDROcodone-acetaminophen (NORCO) 7.5-325 MG per tablet Take 1 tablet by mouth every 6 (six) hours as needed for moderate pain.       [name] losartan (COZAAR) 50 MG tablet Take 50 mg by mouth 2 (two) times daily.       . metoCLOPramide (REGLAN) 10 MG tablet Take one tablet one half-hour before meals & 1 tablet at bedtime      . pantoprazole (PROTONIX) 40 MG tablet Take 40 mg by mouth daily.      [name] PROAIR HFA 108 (90 BASE) MCG/ACT inhaler Inhale 2 puffs into the lungs 2 (two) times daily as needed.      . Probiotic Product (RESTORA) CAPS Take 1 capsule by mouth daily.  30 capsule  2  . simvastatin (ZOCOR) 20 MG tablet Take 20 mg by mouth every evening.      . triamcinolone (NASACORT) 55 MCG/ACT AERO nasal inhaler Place 1 spray into the nose daily.  No current facility-administered medications for this visit.    No Known Allergies  History   Social History  . Marital Status: Married    Spouse Name: [name]    Number of Children: [name]  . Years of Education: [name]   Occupational History  . retired    Social History Main Topics  . Smoking status: Passive Smoke Exposure - Never Smoker  . Smokeless tobacco: Never Used     Comment: as a teenager tried a couple times  . Alcohol Use: No  . Drug Use: No  . Sexual Activity: Yes   Other Topics Concern  . Not on file   Social History Narrative  . No narrative on file     Review of Systems: General: negative for chills, fever, night sweats or weight changes.  Cardiovascular: negative for chest pain, dyspnea on exertion, edema, orthopnea,  palpitations, paroxysmal nocturnal dyspnea or shortness of breath Dermatological: negative for rash Respiratory: negative for cough or wheezing Urologic: negative for hematuria Abdominal: negative for nausea, vomiting, diarrhea, bright red blood per rectum, melena, or hematemesis Neurologic: negative for visual changes, syncope, or dizziness All other systems reviewed and are otherwise negative except as noted above.    Blood pressure 149/94, pulse 100, height 5' 3.5" (1.613 m), weight 272 lb (123.378 kg).  General appearance: alert and no distress Neck: no adenopathy, no carotid bruit, no JVD, supple, symmetrical, trachea midline and thyroid not enlarged, symmetric, no tenderness/mass/nodules Lungs: clear to auscultation bilaterally Heart: regular rate and rhythm, S1, S2 normal, no murmur, click, rub or gallop Extremities: extremities normal, atraumatic, no cyanosis or edema  EKG not performed today  ASSESSMENT AND PLAN:   Paroxysmal atrial fibrillation On rate lowering drugs and eliquis anticoagulation with a CHA2DS2VASC score of 3. She did see Dr. Rayann Heman in the office for evaluation.2-D echocardiogram revealed normal LV function and normal left atrial size  HTN (hypertension) Controlled on current medications  Hyperlipemia On statin therapy followed by her PCP      Lorretta Harp MD [facility], [facility] 12/11/2013 11:38 AM

## 2013-12-11 NOTE — Assessment & Plan Note (Signed)
Controlled on current medications 

## 2013-12-13 ENCOUNTER — Other Ambulatory Visit: Payer: Self-pay | Admitting: Orthopedic Surgery

## 2013-12-13 DIAGNOSIS — M545 Low back pain, unspecified: Secondary | ICD-10-CM | POA: Diagnosis not present

## 2013-12-17 ENCOUNTER — Ambulatory Visit
Admission: RE | Admit: 2013-12-17 | Discharge: 2013-12-17 | Disposition: A | Discharge status: Home / Self Care | Payer: Medicare Other | Source: Ambulatory Visit | Attending: Orthopedic Surgery | Admitting: Orthopedic Surgery

## 2013-12-17 DIAGNOSIS — IMO0002 Reserved for concepts with insufficient information to code with codable children: Secondary | ICD-10-CM | POA: Diagnosis not present

## 2013-12-17 DIAGNOSIS — M48061 Spinal stenosis, lumbar region without neurogenic claudication: Secondary | ICD-10-CM | POA: Diagnosis not present

## 2013-12-17 DIAGNOSIS — M5137 Other intervertebral disc degeneration, lumbosacral region: Secondary | ICD-10-CM | POA: Diagnosis not present

## 2013-12-17 DIAGNOSIS — M545 Low back pain, unspecified: Secondary | ICD-10-CM

## 2013-12-17 DIAGNOSIS — M47817 Spondylosis without myelopathy or radiculopathy, lumbosacral region: Secondary | ICD-10-CM | POA: Diagnosis not present

## 2013-12-18 ENCOUNTER — Telehealth: Payer: Self-pay | Admitting: *Deleted

## 2013-12-18 NOTE — Telephone Encounter (Signed)
[facility] pharmacy faxed a paper asking for clarification about simvastatin and diltiazem.  Patient is taking simvastatin 20mg  and diltiazem 180mg .  Dr Gwenlyn Found, please advise

## 2013-12-18 NOTE — Telephone Encounter (Signed)
Okay to continue diltiazem and simvastatin

## 2013-12-19 NOTE — Telephone Encounter (Signed)
Form signed to continue meds and faxed back

## 2013-12-20 ENCOUNTER — Other Ambulatory Visit: Payer: Self-pay | Admitting: Orthopedic Surgery

## 2013-12-20 DIAGNOSIS — M25551 Pain in right hip: Secondary | ICD-10-CM

## 2013-12-20 DIAGNOSIS — M545 Low back pain: Secondary | ICD-10-CM | POA: Diagnosis not present

## 2013-12-22 ENCOUNTER — Ambulatory Visit
Admission: RE | Admit: 2013-12-22 | Discharge: 2013-12-22 | Disposition: A | Discharge status: Home / Self Care | Payer: Medicare Other | Source: Ambulatory Visit | Attending: Orthopedic Surgery | Admitting: Orthopedic Surgery

## 2013-12-22 DIAGNOSIS — M25551 Pain in right hip: Secondary | ICD-10-CM | POA: Diagnosis not present

## 2013-12-22 DIAGNOSIS — S79911A Unspecified injury of right hip, initial encounter: Secondary | ICD-10-CM | POA: Diagnosis not present

## 2013-12-22 DIAGNOSIS — M1611 Unilateral primary osteoarthritis, right hip: Secondary | ICD-10-CM | POA: Diagnosis not present

## 2013-12-28 ENCOUNTER — Other Ambulatory Visit: Payer: Medicare Other

## 2014-01-03 DIAGNOSIS — Z23 Encounter for immunization: Secondary | ICD-10-CM | POA: Diagnosis not present

## 2014-01-14 ENCOUNTER — Ambulatory Visit: Payer: Medicare Other | Admitting: Internal Medicine

## 2014-01-16 DIAGNOSIS — M25551 Pain in right hip: Secondary | ICD-10-CM | POA: Diagnosis not present

## 2014-02-04 ENCOUNTER — Encounter: Payer: Self-pay | Admitting: Internal Medicine

## 2014-02-04 ENCOUNTER — Ambulatory Visit (INDEPENDENT_AMBULATORY_CARE_PROVIDER_SITE_OTHER): Payer: Medicare Other | Admitting: Internal Medicine

## 2014-02-04 VITALS — BP 136/84 | HR 92 | Ht 63.5 in | Wt 275.8 lb

## 2014-02-04 DIAGNOSIS — I480 Paroxysmal atrial fibrillation: Secondary | ICD-10-CM

## 2014-02-04 DIAGNOSIS — Z6841 Body Mass Index (BMI) 40.0 and over, adult: Secondary | ICD-10-CM

## 2014-02-04 NOTE — Progress Notes (Signed)
Primary Care Physician: [name], MD Primary Cardiologist:Dr. Quay Burow Primary Electrophysiologist: Dr. Thompson Grayer Referring Physician: Dr. Quay Burow   [name] is a 68 y.o. female with a h/o morbid obesity paroxysmal atrial fibrillation who presents for consultation in the [facility].  The patient was initially diagnosed with atrial fibrillation  after presenting with symptoms of shortness of breath with exertion, diaphoresis. Patient was evaluated in the vocal cord dysfunction clinic at [facility] and it was felt that her shortness of breath was out of proportion  to her level of dysfunction. She was encouraged to see a cardiologist and pulmonologist. She does have history of asthma and her inhaler was changed which patient thinks may be contributing to less shortness of breath. She saw Dr. Gwenlyn Found for a Holter monitor which did reveal some nonsustained episodes of A. Fib, as well as SVT.   Chads 2 vas score of at least  3 and has been started on Eliquis which she is tolerating. She was also started on diltiazem which she feels has made a significant improvement in her symptoms, now with less shortness of breath very little diaphoresis with exertion. She was never aware of any heart racing or palpitations. Had undergone a sleep study which was borderline for the need for CPAP. Had an echo which shows normal EF with LVD, normal atrial sizes.  She returns today without any reports of atrial fibrillation. Says she was never aware of any irregularity. She still has some dyspnea possibly from inactivity and dyspnea. She has normal EF and a cath 3 years ago with She has multiple orthopedic issues with previous back, hip and knee surgeries which limit her ability to be active. Was seeing a dietitian but due to knee issues cannot get up her steps. Weight is unchanged. No bleeding issues on NOAC.  Today, she denies symptoms of palpitations, chest pain,   orthopnea, PND, lower extremity edema, dizziness, presyncope, syncope,  daytime somnolence, bleeding, or neurologic sequela. The patient is tolerating medications without difficulties and is otherwise without complaint today.    Atrial Fibrillation Risk Factors:  she does have symptoms or diagnosis of sleep apnea with borderline sleep apnea, not requiring CPAP.   she does not have a history of rheumatic fever.  she does not have a history of alcohol use.  she has a BMI of Body mass index is 48.08 kg/(m^2).[name] Filed Weights   02/04/14 1041  Weight: 275 lb 12.8 oz (125.102 kg)    LA size: echo pending, reported normal in 2013. Cardiac catheterization also reported as normal in 2013. Atrial Fibrillation Management history:  Previous antiarrhythmic drugs: none  Previous cardioversions: none  Previous ablations: none  CHADS2VASC score: at least 3  Anticoagulation history: Eliquis 5 mg bid   Past Medical History  Diagnosis Date  . Asthma   . Arthritis   . Hypertension   . Hypercholesteremia   . Unstable angina 09/09/2011  . HTN (hypertension) 09/09/2011  . Hyperlipemia 09/09/2011  . GERD (gastroesophageal reflux disease) 09/09/2011  . Anxiety   . Hiatal hernia   . Diverticulosis   . Antral polyp     benign  . IBS (irritable bowel syndrome)   . Obesity   . Internal hemorrhoid   . Vocal cord dysfunction 2013    swelling  . PONV (postoperative nausea and vomiting)     with big surgeries, none with endos etc  . Shortness of breath   . Tachycardia   .  Paroxysmal atrial fibrillation    Past Surgical History  Procedure Laterality Date  . Total knee arthroplasty Bilateral     left 2002, right 2012  . Cholecystectomy    . Abdominal hysterectomy  1988  . Tumor excision  1964    rt. leg fatty tumor  . Shoulder surgery Right 1987  . Wrist surgery Left 1997, 2009   . Elbow surgery Left     removed bone chip  . Foot osteotomy w/ plantar fascia release Left   . Total ankle  replacement Right     x 3  . Hip ilioban release Right 1997  . Total ankle replacement Right 2012  . Ankle fusion Right     x 2  . Thumb replacement Bilateral     one 2009 and one 2014  . Back surgery  [id number]    3 ruptured discs, lower back  . Nasal sinus surgery  1997  . Esophagogastroduodenoscopy [name] 03/06/2013    Procedure: ESOPHAGOGASTRODUODENOSCOPY (EGD);  Surgeon: Inda Castle, MD;  Location: [facility];  Service: Endoscopy;  Laterality: [name];  . Bravo ph study [name] 03/06/2013    Procedure: BRAVO [place] STUDY;  Surgeon: Inda Castle, MD;  Location: [facility];  Service: Endoscopy;  Laterality: [name];  . Tfc wrist Left 1997    Current Outpatient Prescriptions  Medication Sig Dispense Refill  . apixaban (ELIQUIS) 5 MG TABS tablet Take 1 tablet (5 mg total) by mouth 2 (two) times daily. 180 tablet 3  . budesonide (PULMICORT) 0.5 MG/2ML nebulizer solution Take 2 mLs (0.5 mg total) by nebulization 2 (two) times daily. 120 mL 12  . Cholecalciferol (VITAMIN D3) 5000 UNITS CAPS Take 1 tablet by mouth at bedtime.    . clorazepate (TRANXENE) 7.5 MG tablet Take 7.5 mg by mouth daily as needed for anxiety.     . diclofenac (VOLTAREN) 75 MG EC tablet Take 75 mg by mouth daily.    [name] diltiazem (CARDIZEM CD) 180 MG 24 hr capsule Take 1 capsule (180 mg total) by mouth daily. 90 capsule 3  . DULoxetine (CYMBALTA) 60 MG capsule Take 1 capsule by mouth daily with supper.    [name] HYDROcodone-acetaminophen (NORCO) 7.5-325 MG per tablet Take 1 tablet by mouth every 6 (six) hours as needed for moderate pain.     [name] losartan (COZAAR) 50 MG tablet Take 50 mg by mouth 2 (two) times daily.     . metoCLOPramide (REGLAN) 10 MG tablet Take 1 tablet by mouth one half-hour before meals & 1 tablet at bedtime    . pantoprazole (PROTONIX) 40 MG tablet Take 40 mg by mouth daily.    [name] PROAIR HFA 108 (90 BASE) MCG/ACT inhaler Inhale 2 puffs into the lungs 2 (two) times daily as needed for wheezing or shortness  of breath.     . Probiotic Product (RESTORA) CAPS Take 1 capsule by mouth daily. 30 capsule 2  . simvastatin (ZOCOR) 20 MG tablet Take 20 mg by mouth every evening.    . triamcinolone (NASACORT) 55 MCG/ACT AERO nasal inhaler Place 1 spray into the nose daily.     No current facility-administered medications for this visit.    No Known Allergies  History   Social History  . Marital Status: Married    Spouse Name: [name]    Number of Children: [name]  . Years of Education: [name]   Occupational History  . retired    Social History Main Topics  . Smoking status: Passive Smoke Exposure -  Never Smoker  . Smokeless tobacco: Never Used     Comment: as a teenager tried a couple times  . Alcohol Use: No  . Drug Use: No  . Sexual Activity: Yes   Other Topics Concern  . Not on file   Social History Narrative    Family History  Problem Relation Age of Onset  . Hypertension Mother   . Esophageal cancer Father 4  . Coronary artery disease Neg Hx   . Diabetes Mother   . Arthritis Mother   . Colon cancer Neg Hx   . Esophageal cancer Mother   . Liver disease Neg Hx   . Kidney disease Neg Hx   . Other Sister     kidney mass removed  . Heart disease Maternal Grandmother    The patient does not have a history of early familial atrial fibrillation or other arrhythmias.  ROS- All systems are reviewed and negative except as per the HPI above.  Physical Exam: Filed Vitals:   02/04/14 1041  BP: 136/84  Pulse: 92  Height: 5' 3.5" (1.613 m)  Weight: 275 lb 12.8 oz (125.102 kg)    GEN- The patient is well appearing, alert and oriented x 3 today.   Head- normocephalic, atraumatic Eyes-  Sclera clear, conjunctiva pink Ears- hearing intact Oropharynx- clear Neck- supple, no JVP Lymph- no cervical lymphadenopathy Lungs- Clear to ausculation bilaterally, normal work of breathing Heart- Regular rate and rhythm, no murmurs, rubs or gallops, PMI not laterally displaced GI- soft, NT, ND, +  BS Extremities- no clubbing, cyanosis, or edema MS- no significant deformity or atrophy Skin- no rash or lesion Psych- euthymic mood, full affect Neuro- strength and sensation are intact  EKG sinus rhythm at 92 bpm, LAD, unchanged from previous. Echo 10/26/13  Left ventricle: The cavity size was normal. There was moderate concentric hypertrophy. Systolic function was normal. The estimated ejection fraction was in the range of 55% to 60%. Wall motion was normal; there were no regional wall motion abnormalities. Doppler parameters are consistent with abnormal left ventricular relaxation (grade 1 diastolic dysfunction). The E/e&' ratio is between 8-15, suggesting indeterminate LV filling pressure. - Aortic valve: Poorly visualized. Trileaflet. There was trivial regurgitation. - Right atrium: The atrium was normal in size.  Holter monitor revealed afib of short duration without any significant burden, as well as nonsustained episodes of SVT.    Assessment and Plan:  1. Atrial fibrillation The patient has Asymptomatic paroxysmal  fibrillation.  The patients CHAD2VASC score is  at least 3.  she is  appropriately anticoagulated at this time. The patient is adequately rate controlled with ditiazem, with  reduction in exertional edema.  For now, continue with rate control and reserve  antiarrhythmics if afib burden significantly increases and patient becomes symptomatic.  A long discussion with the patient was had today regarding therapeutic strategies.  Extensive discussion of lifestyle modification, including weight loss, treatment of any sleep disorders was also discussed.    2. Morbid obesity As above, lifestyle modification was discussed at length including regular exercise and weight reduction. She struggles with weight loss, due to orthopedic issues.  3. Obstructive sleep apnea Evaluated and does not appear to have significant sleep apnea.  4. See back in afib  clinic in 3 months.   Roderic Palau NP  Nurse Practitioner, [facility]  I have seen, examined the patient, and reviewed the above assessment and plan.  Changes to above are made where necessary.  The patient  has minimal symptoms presently.  She does have significant lifestyle modification measures required.  She will follow with Roderic Palau in the afib clinic going forward.  I am happy to see her in the future should she have additional issues with her rhythm.  Co Sign: Thompson Grayer, MD 02/04/2014 8:29 PM

## 2014-02-04 NOTE — Patient Instructions (Signed)
Your physician recommends that you schedule a follow-up appointment in: 3 months afib clinic with Roderic Palau, NP at hospital

## 2014-02-07 ENCOUNTER — Telehealth: Payer: Self-pay | Admitting: Cardiovascular Disease

## 2014-02-07 DIAGNOSIS — M545 Low back pain: Secondary | ICD-10-CM | POA: Diagnosis not present

## 2014-02-07 DIAGNOSIS — M25551 Pain in right hip: Secondary | ICD-10-CM | POA: Diagnosis not present

## 2014-02-07 DIAGNOSIS — M25562 Pain in left knee: Secondary | ICD-10-CM | POA: Diagnosis not present

## 2014-02-07 NOTE — Telephone Encounter (Signed)
[name] is calling in to get clearance for the pt to stop taking her Eliquis because she will be receiving a epidural steriod shot. She stated that she will be faxing over a clearance form. Please call  thanks

## 2014-02-08 ENCOUNTER — Telehealth: Payer: Self-pay | Admitting: *Deleted

## 2014-02-08 NOTE — Telephone Encounter (Signed)
Okay to stop Eliquis  for her injections

## 2014-02-08 NOTE — Telephone Encounter (Signed)
Cardiac clearance for injection request. Ok to stop Eliquis??

## 2014-02-08 NOTE — Telephone Encounter (Signed)
Spoke to [employer] [facility]) RN asked Laurette Schimke to refax clearance  RN  gave to [employer]

## 2014-02-11 NOTE — Telephone Encounter (Signed)
Pt says she still do not know when to stop her Eliquis and they still have not received her clarence at Dr Cherre Huger office.

## 2014-02-11 NOTE — Telephone Encounter (Signed)
Patient can hold Eliquis 2 days prior to procedure per Dr Gwenlyn Found.  Patient aware.  Dr Gwenlyn Found signed fax for [facility] and I faxed it to them.

## 2014-02-11 NOTE — Telephone Encounter (Signed)
Spoke with [employer], they need to know how long the patient would need to hold the eliquis

## 2014-02-26 DIAGNOSIS — M25551 Pain in right hip: Secondary | ICD-10-CM | POA: Diagnosis not present

## 2014-02-26 DIAGNOSIS — M545 Low back pain: Secondary | ICD-10-CM | POA: Diagnosis not present

## 2014-02-26 DIAGNOSIS — M25552 Pain in left hip: Secondary | ICD-10-CM | POA: Diagnosis not present

## 2014-02-28 ENCOUNTER — Encounter (HOSPITAL_COMMUNITY): Payer: Self-pay | Admitting: Cardiovascular Disease

## 2014-03-28 DIAGNOSIS — M25571 Pain in right ankle and joints of right foot: Secondary | ICD-10-CM | POA: Diagnosis not present

## 2014-03-28 DIAGNOSIS — Z471 Aftercare following joint replacement surgery: Secondary | ICD-10-CM | POA: Insufficient documentation

## 2014-03-28 DIAGNOSIS — M1990 Unspecified osteoarthritis, unspecified site: Secondary | ICD-10-CM | POA: Diagnosis not present

## 2014-03-28 DIAGNOSIS — Z96661 Presence of right artificial ankle joint: Secondary | ICD-10-CM | POA: Diagnosis not present

## 2014-03-28 DIAGNOSIS — Z9660 Presence of unspecified orthopedic joint implant: Secondary | ICD-10-CM | POA: Insufficient documentation

## 2014-03-29 DIAGNOSIS — I10 Essential (primary) hypertension: Secondary | ICD-10-CM | POA: Diagnosis not present

## 2014-03-29 DIAGNOSIS — R7301 Impaired fasting glucose: Secondary | ICD-10-CM | POA: Diagnosis not present

## 2014-03-29 DIAGNOSIS — Z0000 Encounter for general adult medical examination without abnormal findings: Secondary | ICD-10-CM | POA: Diagnosis not present

## 2014-03-29 DIAGNOSIS — E785 Hyperlipidemia, unspecified: Secondary | ICD-10-CM | POA: Diagnosis not present

## 2014-03-29 DIAGNOSIS — E559 Vitamin D deficiency, unspecified: Secondary | ICD-10-CM | POA: Diagnosis not present

## 2014-03-31 ENCOUNTER — Other Ambulatory Visit: Payer: Self-pay | Admitting: Cardiology

## 2014-04-05 DIAGNOSIS — K589 Irritable bowel syndrome without diarrhea: Secondary | ICD-10-CM | POA: Diagnosis not present

## 2014-04-05 DIAGNOSIS — Z1389 Encounter for screening for other disorder: Secondary | ICD-10-CM | POA: Diagnosis not present

## 2014-04-05 DIAGNOSIS — I480 Paroxysmal atrial fibrillation: Secondary | ICD-10-CM | POA: Diagnosis not present

## 2014-04-05 DIAGNOSIS — R7301 Impaired fasting glucose: Secondary | ICD-10-CM | POA: Diagnosis not present

## 2014-04-05 DIAGNOSIS — Z6841 Body Mass Index (BMI) 40.0 and over, adult: Secondary | ICD-10-CM | POA: Diagnosis not present

## 2014-04-05 DIAGNOSIS — E559 Vitamin D deficiency, unspecified: Secondary | ICD-10-CM | POA: Diagnosis not present

## 2014-04-05 DIAGNOSIS — M159 Polyosteoarthritis, unspecified: Secondary | ICD-10-CM | POA: Diagnosis not present

## 2014-04-05 DIAGNOSIS — I10 Essential (primary) hypertension: Secondary | ICD-10-CM | POA: Diagnosis not present

## 2014-04-05 DIAGNOSIS — Z0000 Encounter for general adult medical examination without abnormal findings: Secondary | ICD-10-CM | POA: Diagnosis not present

## 2014-04-05 DIAGNOSIS — J45909 Unspecified asthma, uncomplicated: Secondary | ICD-10-CM | POA: Diagnosis not present

## 2014-04-05 DIAGNOSIS — M25552 Pain in left hip: Secondary | ICD-10-CM | POA: Diagnosis not present

## 2014-04-05 DIAGNOSIS — R358 Other polyuria: Secondary | ICD-10-CM | POA: Diagnosis not present

## 2014-04-05 DIAGNOSIS — E785 Hyperlipidemia, unspecified: Secondary | ICD-10-CM | POA: Diagnosis not present

## 2014-04-16 DIAGNOSIS — M25552 Pain in left hip: Secondary | ICD-10-CM | POA: Diagnosis not present

## 2014-04-16 DIAGNOSIS — M25852 Other specified joint disorders, left hip: Secondary | ICD-10-CM | POA: Diagnosis not present

## 2014-04-16 DIAGNOSIS — M1612 Unilateral primary osteoarthritis, left hip: Secondary | ICD-10-CM | POA: Diagnosis not present

## 2014-04-24 ENCOUNTER — Ambulatory Visit (INDEPENDENT_AMBULATORY_CARE_PROVIDER_SITE_OTHER): Payer: Medicare Other | Admitting: Nurse Practitioner

## 2014-04-24 VITALS — BP 120/72 | HR 99 | Ht 63.5 in | Wt 272.8 lb

## 2014-04-24 DIAGNOSIS — I480 Paroxysmal atrial fibrillation: Secondary | ICD-10-CM

## 2014-04-24 NOTE — Patient Instructions (Addendum)
Your physician recommends that you continue on your current medications as directed. Please refer to the Current Medication list given to you today.  Your physician wants you to follow-up in: 6 months with Dr. Rayann Heman.  You will receive a reminder letter in the mail two months in advance. If you don't receive a letter, please call our office to schedule the follow-up appointment.

## 2014-04-24 NOTE — Progress Notes (Signed)
Primary Care Physician: [name], MD Primary Cardiologist:Dr. Quay Burow Primary Electrophysiologist: Dr. Thompson Grayer Referring Physician: Dr. Quay Burow   [name] is a 69 y.o. female with a h/o morbid obesity paroxysmal atrial fibrillation who presents for follow up  in the  [facility].  The patient was initially diagnosed with atrial fibrillation  after presenting with symptoms of shortness of breath with exertion, diaphoresis. Patient was evaluated in the vocal cord dysfunction clinic at [facility] and it was felt that her shortness of breath was out of proportion  to her level of dysfunction. She was encouraged to see a cardiologist and pulmonologist. She does have history of asthma and her inhaler was changed which patient thinks may be contributing to less shortness of breath. She saw Dr. Gwenlyn Found for a Holter monitor which did reveal some nonsustained episodes of A. Fib, as well as SVT.   Chads 2 vas score of at least  3 and has been started on Eliquis which she is tolerating. She was also started on diltiazem which she feels has made a significant improvement in her symptoms, now with less shortness of breath very little diaphoresis with exertion. She was never aware of any heart racing or palpitations. Had undergone a sleep study which was borderline for the need for CPAP. Had an echo which shows normal EF with LVD, normal atrial sizes.  She returns today in pain, crying and upset for need of Left hip surgery  after seeking the opinions of 4 orthopedic surgeons. Everyone of the surgeons said that surgery was out of the question unless she underwent bariatric surgery. The first MD suggested that, but she failed to take his advice and she went on trying to find a doctor that would do the surgery without weight loss. She has finally accepted bariatric surgery as her way to have her hip repaired and get out of pain. She is to have her first appointment next week.In  the interim, she is waiting to be seen in the pain management clinic for her constant pain.  She is unaware of any heart palpitations, and EKG today shows NSR with one PAC. She continues on NOAC without bleeding issues. Is also taking Voltaren, which may increase her bleeding risk and she is going to ask when evaluated in the pain clinic if she can stop this drug.   Today, she denies symptoms of palpitations, chest pain,  orthopnea, PND, lower extremity edema, dizziness, presyncope, syncope,  daytime somnolence, bleeding, or neurologic sequela.Positive for chronic pain as described above. The patient is tolerating medications without difficulties and is otherwise without complaint today.    Atrial Fibrillation Risk Factors:  she does have symptoms or diagnosis of sleep apnea with borderline sleep apnea, not requiring CPAP.   she does not have a history of rheumatic fever.  she does not have a history of alcohol use.  she has a BMI of There is no weight on file to calculate BMI.. There were no vitals filed for this visit.  LA size: echo pending, reported normal in 2013. Cardiac catheterization also reported as normal in 2013. Atrial Fibrillation Management history:  Previous antiarrhythmic drugs: none  Previous cardioversions: none  Previous ablations: none  CHADS2VASC score: at least 3  Anticoagulation history: Eliquis 5 mg bid   Past Medical History  Diagnosis Date  . Asthma   . Arthritis   . Hypertension   . Hypercholesteremia   . Unstable angina 09/09/2011  . HTN (hypertension)  09/09/2011  . Hyperlipemia 09/09/2011  . GERD (gastroesophageal reflux disease) 09/09/2011  . Anxiety   . Hiatal hernia   . Diverticulosis   . Antral polyp     benign  . IBS (irritable bowel syndrome)   . Obesity   . Internal hemorrhoid   . Vocal cord dysfunction 2013    swelling  . PONV (postoperative nausea and vomiting)     with big surgeries, none with endos etc  . Shortness of breath    . Tachycardia   . Paroxysmal atrial fibrillation    Past Surgical History  Procedure Laterality Date  . Total knee arthroplasty Bilateral     left 2002, right 2012  . Cholecystectomy    . Abdominal hysterectomy  1988  . Tumor excision  1964    rt. leg fatty tumor  . Shoulder surgery Right 1987  . Wrist surgery Left 1997, 2009   . Elbow surgery Left     removed bone chip  . Foot osteotomy w/ plantar fascia release Left   . Total ankle replacement Right     x 3  . Hip ilioban release Right 1997  . Total ankle replacement Right 2012  . Ankle fusion Right     x 2  . Thumb replacement Bilateral     one 2009 and one 2014  . Back surgery  [phone]    3 ruptured discs, lower back  . Nasal sinus surgery  1997  . Esophagogastroduodenoscopy [name] 03/06/2013    Procedure: ESOPHAGOGASTRODUODENOSCOPY (EGD);  Surgeon: Inda Castle, MD;  Location: [facility];  Service: Endoscopy;  Laterality: [name];  . Bravo ph study [name] 03/06/2013    Procedure: BRAVO [place] STUDY;  Surgeon: Inda Castle, MD;  Location: [facility] ENDOSCOPY;  Service: Endoscopy;  Laterality: [name];  . Tfc wrist Left 1997  . Left heart catheterization with coronary angiogram Bilateral 09/10/2011    Procedure: LEFT HEART CATHETERIZATION WITH CORONARY ANGIOGRAM;  Surgeon: Lorretta Harp, MD;  Location: [facility] CATH LAB;  Service: Cardiovascular;  Laterality: Bilateral;    Current Outpatient Prescriptions  Medication Sig Dispense Refill  . apixaban (ELIQUIS) 5 MG TABS tablet Take 1 tablet (5 mg total) by mouth 2 (two) times daily. 180 tablet 3  . budesonide (PULMICORT) 0.5 MG/2ML nebulizer solution Take 2 mLs (0.5 mg total) by nebulization 2 (two) times daily. 120 mL 12  . Cholecalciferol (VITAMIN D3) 5000 UNITS CAPS Take 1 tablet by mouth at bedtime.    . clorazepate (TRANXENE) 7.5 MG tablet Take 7.5 mg by mouth daily as needed for anxiety.     . diclofenac (VOLTAREN) 75 MG EC tablet Take 75 mg by mouth daily.    [name] diltiazem  (CARDIZEM CD) 180 MG 24 hr capsule Take 1 capsule (180 mg total) by mouth daily. 90 capsule 3  . DULoxetine (CYMBALTA) 60 MG capsule Take 1 capsule by mouth daily with supper.    [name] ELIQUIS 5 MG TABS tablet TAKE ONE TABLET BY MOUTH TWICE DAILY 60 tablet 5  . HYDROcodone-acetaminophen (NORCO) 7.5-325 MG per tablet Take 1 tablet by mouth every 6 (six) hours as needed for moderate pain.     [name] losartan (COZAAR) 50 MG tablet Take 50 mg by mouth 2 (two) times daily.     . metoCLOPramide (REGLAN) 10 MG tablet Take 1 tablet by mouth one half-hour before meals & 1 tablet at bedtime    . pantoprazole (PROTONIX) 40 MG tablet Take 40 mg by mouth daily.    [name]  PROAIR HFA 108 (90 BASE) MCG/ACT inhaler Inhale 2 puffs into the lungs 2 (two) times daily as needed for wheezing or shortness of breath.     . Probiotic Product (RESTORA) CAPS Take 1 capsule by mouth daily. 30 capsule 2  . simvastatin (ZOCOR) 20 MG tablet Take 20 mg by mouth every evening.    . triamcinolone (NASACORT) 55 MCG/ACT AERO nasal inhaler Place 1 spray into the nose daily.     No current facility-administered medications for this visit.    No Known Allergies  History   Social History  . Marital Status: Married    Spouse Name: [name]    Number of Children: [name]  . Years of Education: [name]   Occupational History  . retired    Social History Main Topics  . Smoking status: Passive Smoke Exposure - Never Smoker  . Smokeless tobacco: Never Used     Comment: as a teenager tried a couple times  . Alcohol Use: No  . Drug Use: No  . Sexual Activity: Yes   Other Topics Concern  . Not on file   Social History Narrative    Family History  Problem Relation Age of Onset  . Hypertension Mother   . Esophageal cancer Father 67  . Coronary artery disease Neg Hx   . Diabetes Mother   . Arthritis Mother   . Colon cancer Neg Hx   . Esophageal cancer Mother   . Liver disease Neg Hx   . Kidney disease Neg Hx   . Other Sister     kidney mass  removed  . Heart disease Maternal Grandmother    The patient does not have a history of early familial atrial fibrillation or other arrhythmias.  ROS- All systems are reviewed and negative except as per the HPI above.  Physical Exam:  GEN- The patient is upset,crying, alert and oriented x 3 today.   Head- normocephalic, atraumatic Eyes-  Sclera clear, conjunctiva pink Ears- hearing intact Oropharynx- clear Neck- supple, no JVP Lymph- no cervical lymphadenopathy Lungs- Clear to ausculation bilaterally, normal work of breathing Heart- Regular rate and rhythm, no murmurs, rubs or gallops, PMI not laterally displaced GI- soft, NT, ND, + BS Extremities- no clubbing, cyanosis, or edema MS- no significant deformity or atrophy Skin- no rash or lesion Psych- euthymic mood, full affect Neuro- strength and sensation are intact  EKG sinus rhythm at 93 bpm, one PAC, LAD, possible antrolateral infarct,  unchanged from previous.  Echo 10/26/13  Left ventricle: The cavity size was normal. There was moderate concentric hypertrophy. Systolic function was normal. The estimated ejection fraction was in the range of 55% to 60%. Wall motion was normal; there were no regional wall motion abnormalities. Doppler parameters are consistent with abnormal left ventricular relaxation (grade 1 diastolic dysfunction). The E/e&' ratio is between 8-15, suggesting indeterminate LV filling pressure. - Aortic valve: Poorly visualized. Trileaflet. There was trivial regurgitation. - Right atrium: The atrium was normal in size.   Previous Holter monitor revealed afib of short duration without any significant burden, as well as nonsustained episodes of SVT.    Assessment and Plan:  1. Atrial fibrillation The patient has Asymptomatic paroxysmal  fibrillation.  The patients CHAD2VASC score is  at least 3.  she is  appropriately anticoagulated at this time. She plans to discuss stopping Voltaren  with pain management clinic due to possible issues with NOAC/bleeding. The patient is adequately rate controlled with ditiazem, with  reduction in exertional edema.  For  now, continue with rate control and reserve antiarrhythmics if afib burden significantly increases and patient becomes symptomatic.   2. Morbid obesity As above, lifestyle modification was discussed at length including  weight reduction. She struggles with weight loss, due to orthopedic issues. F/u with bariatric surgery  as scheduled.  3. Obstructive sleep apnea Evaluated and does not appear to have significant sleep apnea.  4. See back in afib clinic in 6 months.   Roderic Palau NP  Nurse Practitioner, [facility]    Co Sign: Roderic Palau, MD 04/24/2014 1:49 PM     Primary Care Physician: [name], MD Primary Cardiologist:Dr. Quay Burow Primary Electrophysiologist: Dr. Thompson Grayer Referring Physician: Dr. Quay Burow   [name] is a 69 y.o. female with a h/o morbid obesity paroxysmal atrial fibrillation who presents for consultation in the [facility].  The patient was initially diagnosed with atrial fibrillation  after presenting with symptoms of shortness of breath with exertion, diaphoresis. Patient was evaluated in the vocal cord dysfunction clinic at [facility] and it was felt that her shortness of breath was out of proportion  to her level of dysfunction. She was encouraged to see a cardiologist and pulmonologist. She does have history of asthma and her inhaler was changed which patient thinks may be contributing to less shortness of breath. She saw Dr. Gwenlyn Found for a Holter monitor which did reveal some nonsustained episodes of A. Fib, as well as SVT.   Chads 2 vas score of at least  3 and has been started on Eliquis which she is tolerating. She was also started on diltiazem which she feels has made a significant improvement in her symptoms, now  with less shortness of breath very little diaphoresis with exertion. She was never aware of any heart racing or palpitations. Had undergone a sleep study which was borderline for the need for CPAP. Had an echo which shows normal EF with LVD, normal atrial sizes.  She returns today without any reports of atrial fibrillation. Says she was never aware of any irregularity. She still has some dyspnea possibly from inactivity and dyspnea. She has normal EF and a cath 3 years ago with She has multiple orthopedic issues with previous back, hip and knee surgeries which limit her ability to be active. Was seeing a dietitian but due to knee issues cannot get up her steps. Weight is unchanged. No bleeding issues on NOAC.  Today, she denies symptoms of palpitations, chest pain,  orthopnea, PND, lower extremity edema, dizziness, presyncope, syncope,  daytime somnolence, bleeding, or neurologic sequela. The patient is tolerating medications without difficulties and is otherwise without complaint today.    Atrial Fibrillation Risk Factors:  she does have symptoms or diagnosis of sleep apnea with borderline sleep apnea, not requiring CPAP.   she does not have a history of rheumatic fever.  she does not have a history of alcohol use.  she has a BMI of Body mass index is 47.56 kg/(m^2).[name] Filed Weights   04/24/14 1405  Weight: 272 lb 12.8 oz (123.741 kg)    LA size: echo pending, reported normal in 2013. Cardiac catheterization also reported as normal in 2013. Atrial Fibrillation Management history:  Previous antiarrhythmic drugs: none  Previous cardioversions: none  Previous ablations: none  CHADS2VASC score: at least 3  Anticoagulation history: Eliquis 5 mg bid   Past Medical History  Diagnosis Date  . Asthma   . Arthritis   . Hypertension   . Hypercholesteremia   .  Unstable angina 09/09/2011  . HTN (hypertension) 09/09/2011  . Hyperlipemia 09/09/2011  . GERD (gastroesophageal reflux disease)  09/09/2011  . Anxiety   . Hiatal hernia   . Diverticulosis   . Antral polyp     benign  . IBS (irritable bowel syndrome)   . Obesity   . Internal hemorrhoid   . Vocal cord dysfunction 2013    swelling  . PONV (postoperative nausea and vomiting)     with big surgeries, none with endos etc  . Shortness of breath   . Tachycardia   . Paroxysmal atrial fibrillation    Past Surgical History  Procedure Laterality Date  . Total knee arthroplasty Bilateral     left 2002, right 2012  . Cholecystectomy    . Abdominal hysterectomy  1988  . Tumor excision  1964    rt. leg fatty tumor  . Shoulder surgery Right 1987  . Wrist surgery Left 1997, 2009   . Elbow surgery Left     removed bone chip  . Foot osteotomy w/ plantar fascia release Left   . Total ankle replacement Right     x 3  . Hip ilioban release Right 1997  . Total ankle replacement Right 2012  . Ankle fusion Right     x 2  . Thumb replacement Bilateral     one 2009 and one 2014  . Back surgery  [phone]    3 ruptured discs, lower back  . Nasal sinus surgery  1997  . Esophagogastroduodenoscopy [name] 03/06/2013    Procedure: ESOPHAGOGASTRODUODENOSCOPY (EGD);  Surgeon: Inda Castle, MD;  Location: [facility];  Service: Endoscopy;  Laterality: [name];  . Bravo ph study [name] 03/06/2013    Procedure: BRAVO [place] STUDY;  Surgeon: Inda Castle, MD;  Location: [facility]OS[facility];  Service: Endoscopy;  Laterality: [name];  . Tfc wrist Left 1997  . Left heart catheterization with coronary angiogram Bilateral 09/10/2011    Procedure: LEFT HEART CATHETERIZATION WITH CORONARY ANGIOGRAM;  Surgeon: Lorretta Harp, MD;  Location: [facility] CATH LAB;  Service: Cardiovascular;  Laterality: Bilateral;    Current Outpatient Prescriptions  Medication Sig Dispense Refill  . apixaban (ELIQUIS) 5 MG TABS tablet Take 1 tablet (5 mg total) by mouth 2 (two) times daily. 180 tablet 3  . budesonide (PULMICORT) 0.5 MG/2ML nebulizer solution Take 2 mLs (0.5 mg  total) by nebulization 2 (two) times daily. 120 mL 12  . Cholecalciferol (VITAMIN D3) 5000 UNITS CAPS Take 1 tablet by mouth at bedtime.    . clorazepate (TRANXENE) 7.5 MG tablet Take 7.5 mg by mouth daily as needed for anxiety.     . diclofenac (VOLTAREN) 75 MG EC tablet Take 75 mg by mouth 2 (two) times daily.     [name] diltiazem (CARDIZEM CD) 180 MG 24 hr capsule Take 1 capsule (180 mg total) by mouth daily. 90 capsule 3  . DULoxetine (CYMBALTA) 60 MG capsule Take 1 capsule by mouth daily with supper.    [name] HYDROcodone-acetaminophen (NORCO/VICODIN) 5-325 MG per tablet Take 1 tablet by mouth as needed.    [name] losartan (COZAAR) 50 MG tablet Take 50 mg by mouth 2 (two) times daily.     . metoCLOPramide (REGLAN) 10 MG tablet Take 1 tablet by mouth one half-hour before meals & 1 tablet at bedtime    . oxyCODONE-acetaminophen (PERCOCET/ROXICET) 5-325 MG per tablet Take 1 tablet by mouth. Every 4 to 6 hours as needed    . pantoprazole (PROTONIX) 40 MG tablet Take  40 mg by mouth daily.    [name] PROAIR HFA 108 (90 BASE) MCG/ACT inhaler Inhale 2 puffs into the lungs 2 (two) times daily as needed for wheezing or shortness of breath.     . Probiotic Product (RESTORA) CAPS Take 1 capsule by mouth daily. 30 capsule 2  . simvastatin (ZOCOR) 20 MG tablet Take 20 mg by mouth every evening.    . triamcinolone (NASACORT) 55 MCG/ACT AERO nasal inhaler Place 1 spray into the nose daily.     No current facility-administered medications for this visit.    No Known Allergies  History   Social History  . Marital Status: Married    Spouse Name: [name]    Number of Children: [name]  . Years of Education: [name]   Occupational History  . retired    Social History Main Topics  . Smoking status: Passive Smoke Exposure - Never Smoker  . Smokeless tobacco: Never Used     Comment: as a teenager tried a couple times  . Alcohol Use: No  . Drug Use: No  . Sexual Activity: Yes   Other Topics Concern  . Not on file   Social  History Narrative    Family History  Problem Relation Age of Onset  . Hypertension Mother   . Esophageal cancer Father 66  . Coronary artery disease Neg Hx   . Diabetes Mother   . Arthritis Mother   . Colon cancer Neg Hx   . Esophageal cancer Mother   . Liver disease Neg Hx   . Kidney disease Neg Hx   . Other Sister     kidney mass removed  . Heart disease Maternal Grandmother    The patient does not have a history of early familial atrial fibrillation or other arrhythmias.  ROS- All systems are reviewed and negative except as per the HPI above.  Physical Exam: Filed Vitals:   04/24/14 1405  BP: 120/72  Pulse: 99  Height: 5' 3.5" (1.613 m)  Weight: 272 lb 12.8 oz (123.741 kg)  SpO2: 97%    GEN- The patient is well appearing, alert and oriented x 3 today.   Head- normocephalic, atraumatic Eyes-  Sclera clear, conjunctiva pink Ears- hearing intact Oropharynx- clear Neck- supple, no JVP Lymph- no cervical lymphadenopathy Lungs- Clear to ausculation bilaterally, normal work of breathing Heart- Regular rate and rhythm, no murmurs, rubs or gallops, PMI not laterally displaced GI- soft, NT, ND, + BS Extremities- no clubbing, cyanosis, or edema MS- no significant deformity or atrophy Skin- no rash or lesion Psych- euthymic mood, full affect Neuro- strength and sensation are intact  EKG sinus rhythm at 92 bpm, LAD, unchanged from previous. Echo 10/26/13  Left ventricle: The cavity size was normal. There was moderate concentric hypertrophy. Systolic function was normal. The estimated ejection fraction was in the range of 55% to 60%. Wall motion was normal; there were no regional wall motion abnormalities. Doppler parameters are consistent with abnormal left ventricular relaxation (grade 1 diastolic dysfunction). The E/e&' ratio is between 8-15, suggesting indeterminate LV filling pressure. - Aortic valve: Poorly visualized. Trileaflet. There was  trivial regurgitation. - Right atrium: The atrium was normal in size.  Holter monitor revealed afib of short duration without any significant burden, as well as nonsustained episodes of SVT.    Assessment and Plan:  1. Atrial fibrillation The patient has Asymptomatic paroxysmal  fibrillation.  The patients CHAD2VASC score is  at least 3.  she is  appropriately anticoagulated at this time.  The patient is adequately rate controlled with ditiazem, with  reduction in exertional edema.  For now, continue with rate control and reserve  antiarrhythmics if afib burden significantly increases and patient becomes symptomatic.  A long discussion with the patient was had today regarding therapeutic strategies.  Extensive discussion of lifestyle modification, including weight loss, treatment of any sleep disorders was also discussed.    2. Morbid obesity As above, lifestyle modification was discussed at length including regular exercise and weight reduction. She struggles with weight loss, due to orthopedic issues.  3. Obstructive sleep apnea Evaluated and does not appear to have significant sleep apnea.  4. See back in afib clinic in 3 months.   Roderic Palau NP  Nurse Practitioner, [facility]  I have seen, examined the patient, and reviewed the above assessment and plan.  Changes to above are made where necessary.  The patient has minimal symptoms presently.  She does have significant lifestyle modification measures required.  She will follow with Roderic Palau in the afib clinic going forward.  I am happy to see her in the future should she have additional issues with her rhythm.  Co SignRoderic Palau, MD 04/25/2014 8:36 AM

## 2014-04-25 ENCOUNTER — Encounter: Payer: Self-pay | Admitting: Nurse Practitioner

## 2014-04-29 DIAGNOSIS — Z6841 Body Mass Index (BMI) 40.0 and over, adult: Secondary | ICD-10-CM | POA: Diagnosis not present

## 2014-04-29 DIAGNOSIS — G8929 Other chronic pain: Secondary | ICD-10-CM | POA: Diagnosis not present

## 2014-04-29 DIAGNOSIS — I10 Essential (primary) hypertension: Secondary | ICD-10-CM | POA: Diagnosis not present

## 2014-04-29 DIAGNOSIS — J45909 Unspecified asthma, uncomplicated: Secondary | ICD-10-CM | POA: Diagnosis not present

## 2014-04-29 DIAGNOSIS — M159 Polyosteoarthritis, unspecified: Secondary | ICD-10-CM | POA: Diagnosis not present

## 2014-04-29 DIAGNOSIS — M25552 Pain in left hip: Secondary | ICD-10-CM | POA: Diagnosis not present

## 2014-04-29 DIAGNOSIS — I480 Paroxysmal atrial fibrillation: Secondary | ICD-10-CM | POA: Diagnosis not present

## 2014-05-13 DIAGNOSIS — M1990 Unspecified osteoarthritis, unspecified site: Secondary | ICD-10-CM | POA: Diagnosis not present

## 2014-05-13 DIAGNOSIS — M797 Fibromyalgia: Secondary | ICD-10-CM | POA: Diagnosis not present

## 2014-05-13 DIAGNOSIS — M961 Postlaminectomy syndrome, not elsewhere classified: Secondary | ICD-10-CM | POA: Diagnosis not present

## 2014-05-13 DIAGNOSIS — M792 Neuralgia and neuritis, unspecified: Secondary | ICD-10-CM | POA: Diagnosis not present

## 2014-05-27 DIAGNOSIS — G894 Chronic pain syndrome: Secondary | ICD-10-CM | POA: Diagnosis not present

## 2014-05-27 DIAGNOSIS — M961 Postlaminectomy syndrome, not elsewhere classified: Secondary | ICD-10-CM | POA: Diagnosis not present

## 2014-05-27 DIAGNOSIS — M25569 Pain in unspecified knee: Secondary | ICD-10-CM | POA: Diagnosis not present

## 2014-05-27 DIAGNOSIS — Z79899 Other long term (current) drug therapy: Secondary | ICD-10-CM | POA: Diagnosis not present

## 2014-06-10 DIAGNOSIS — M545 Low back pain: Secondary | ICD-10-CM | POA: Diagnosis not present

## 2014-06-10 DIAGNOSIS — G894 Chronic pain syndrome: Secondary | ICD-10-CM | POA: Diagnosis not present

## 2014-06-10 DIAGNOSIS — M25569 Pain in unspecified knee: Secondary | ICD-10-CM | POA: Diagnosis not present

## 2014-06-10 DIAGNOSIS — Z79899 Other long term (current) drug therapy: Secondary | ICD-10-CM | POA: Diagnosis not present

## 2014-06-12 ENCOUNTER — Other Ambulatory Visit: Payer: Self-pay | Admitting: Surgery

## 2014-06-17 DIAGNOSIS — I10 Essential (primary) hypertension: Secondary | ICD-10-CM | POA: Diagnosis not present

## 2014-06-17 DIAGNOSIS — K912 Postsurgical malabsorption, not elsewhere classified: Secondary | ICD-10-CM | POA: Diagnosis not present

## 2014-06-17 DIAGNOSIS — E780 Pure hypercholesterolemia: Secondary | ICD-10-CM | POA: Diagnosis not present

## 2014-06-26 ENCOUNTER — Other Ambulatory Visit (INDEPENDENT_AMBULATORY_CARE_PROVIDER_SITE_OTHER): Payer: Self-pay

## 2014-06-26 ENCOUNTER — Other Ambulatory Visit: Payer: Self-pay | Admitting: Surgery

## 2014-06-27 ENCOUNTER — Encounter: Payer: Medicare Other | Attending: Surgery | Admitting: Dietician

## 2014-06-27 ENCOUNTER — Other Ambulatory Visit: Payer: Self-pay

## 2014-06-27 ENCOUNTER — Encounter: Payer: Self-pay | Admitting: Dietician

## 2014-06-27 VITALS — Ht 63.0 in | Wt 277.7 lb

## 2014-06-27 DIAGNOSIS — Z713 Dietary counseling and surveillance: Secondary | ICD-10-CM | POA: Insufficient documentation

## 2014-06-27 DIAGNOSIS — Z6841 Body Mass Index (BMI) 40.0 and over, adult: Secondary | ICD-10-CM | POA: Diagnosis not present

## 2014-06-27 NOTE — Patient Instructions (Signed)
Follow Pre-Op Goals Try Protein Shakes Call [facility] at [phone] when surgery is scheduled to enroll in Pre-Op Class Contact Sherion at [facility] to see about your next steps.  Things to remember:  Please always be honest with [place]. We want to support you!  If you have any questions or concerns in between appointments, please call or email Ferol Luz, or Margarita Grizzle.  The diet after surgery will be high protein and low in carbohydrate.  Vitamins and calcium need to be taken for the rest of your life.  Feel free to include support people in any classes or appointments.

## 2014-06-27 NOTE — Progress Notes (Signed)
  Pre-Op Assessment Visit:  Pre-Operative Sleeve Gastrectomy Surgery  Medical Nutrition Therapy:  Appt start time: 0845   End time:  0940.  Patient was seen on 06/27/2014 for Pre-Operative Nutrition Assessment. Assessment and letter of approval faxed to [facility] Bariatric Surgery Program coordinator on 06/27/2014.   Preferred Learning Style:   No preference indicated   Learning Readiness:   Ready  Handouts given during visit include:  Pre-Op Goals Bariatric Surgery Protein Shakes   During the appointment today the following Pre-Op Goals were reviewed with the patient: Maintain or lose weight as instructed by your surgeon Make healthy food choices Begin to limit portion sizes Limited concentrated sugars and fried foods Keep fat/sugar in the single digits per serving on   food labels Practice CHEWING your food  (aim for 30 chews per bite or until applesauce consistency) Practice not drinking 15 minutes before, during, and 30 minutes after each meal/snack Avoid all carbonated beverages  Avoid/limit caffeinated beverages  Avoid all sugar-sweetened beverages Consume 3 meals per day; eat every 3-5 hours Make a list of non-food related activities Aim for 64-100 ounces of FLUID daily  Aim for at least 60-80 grams of PROTEIN daily Look for a liquid protein source that contain ?15 g protein and ?5 g carbohydrate  (ex: shakes, drinks, shots)  Patient-Centered Goals:  She would like to start to start walking again and look better. 7 level of confidence/10 level of importanve  Demonstrated degree of understanding via:  Teach Back  Teaching Method Utilized:  Visual Auditory Hands on  Barriers to learning/adherence to lifestyle change: pain, limited mobility  Patient to call the [facility] to enroll in Pre-Op and Post-Op Nutrition Education when surgery date is scheduled.

## 2014-07-01 DIAGNOSIS — M79606 Pain in leg, unspecified: Secondary | ICD-10-CM | POA: Diagnosis not present

## 2014-07-01 DIAGNOSIS — Z79899 Other long term (current) drug therapy: Secondary | ICD-10-CM | POA: Diagnosis not present

## 2014-07-01 DIAGNOSIS — M545 Low back pain: Secondary | ICD-10-CM | POA: Diagnosis not present

## 2014-07-01 DIAGNOSIS — G894 Chronic pain syndrome: Secondary | ICD-10-CM | POA: Diagnosis not present

## 2014-07-03 ENCOUNTER — Ambulatory Visit (HOSPITAL_COMMUNITY)
Admission: RE | Admit: 2014-07-03 | Discharge: 2014-07-03 | Disposition: A | Discharge status: Home / Self Care | Payer: Medicare Other | Source: Ambulatory Visit | Attending: Surgery | Admitting: Surgery

## 2014-07-03 ENCOUNTER — Other Ambulatory Visit: Payer: Self-pay

## 2014-07-03 DIAGNOSIS — Z1389 Encounter for screening for other disorder: Secondary | ICD-10-CM | POA: Diagnosis not present

## 2014-07-03 DIAGNOSIS — K449 Diaphragmatic hernia without obstruction or gangrene: Secondary | ICD-10-CM | POA: Insufficient documentation

## 2014-07-17 DIAGNOSIS — R1030 Lower abdominal pain, unspecified: Secondary | ICD-10-CM | POA: Diagnosis not present

## 2014-07-17 DIAGNOSIS — M1990 Unspecified osteoarthritis, unspecified site: Secondary | ICD-10-CM | POA: Diagnosis not present

## 2014-07-17 DIAGNOSIS — M25559 Pain in unspecified hip: Secondary | ICD-10-CM | POA: Diagnosis not present

## 2014-07-17 DIAGNOSIS — Z79899 Other long term (current) drug therapy: Secondary | ICD-10-CM | POA: Diagnosis not present

## 2014-07-17 DIAGNOSIS — G894 Chronic pain syndrome: Secondary | ICD-10-CM | POA: Diagnosis not present

## 2014-07-17 DIAGNOSIS — M545 Low back pain: Secondary | ICD-10-CM | POA: Diagnosis not present

## 2014-07-18 ENCOUNTER — Ambulatory Visit (INDEPENDENT_AMBULATORY_CARE_PROVIDER_SITE_OTHER): Payer: Medicare Other | Admitting: Psychology

## 2014-07-18 DIAGNOSIS — E669 Obesity, unspecified: Secondary | ICD-10-CM

## 2014-07-18 DIAGNOSIS — F4321 Adjustment disorder with depressed mood: Secondary | ICD-10-CM

## 2014-07-29 ENCOUNTER — Encounter: Payer: Medicare Other | Attending: Surgery

## 2014-07-29 DIAGNOSIS — Z6841 Body Mass Index (BMI) 40.0 and over, adult: Secondary | ICD-10-CM | POA: Insufficient documentation

## 2014-07-29 DIAGNOSIS — Z713 Dietary counseling and surveillance: Secondary | ICD-10-CM | POA: Insufficient documentation

## 2014-07-29 NOTE — Progress Notes (Signed)
  Pre-Operative Nutrition Class:  Appt start time: 7225   End time:  1830.  Patient was seen on 07/29/2014 for Pre-Operative Bariatric Surgery Education at [facility].   Surgery date: 08/20/14 Surgery type: Gastric sleeve Start weight at [facility]: 278 lbs on 06/27/14 Weight today: patient declined  TANITA  BODY COMP RESULTS  Patient unable   BMI (kg/m^2)    Fat Mass (lbs)    Fat Free Mass (lbs)    Total Body Water (lbs)    Samples given per MNT protocol. Patient educated on appropriate usage: Premier protein shake (chocolate - qty 1) Lot #: [id number] Exp: 01/2015  Celebrate multivitamin (mandarin orange - qty 1) Lot #: [id number] Exp: 02/2015  PB2 (chocolate - qty 1) Lot #: none Exp: 01/2015  Unjury Protein Powder (unflavored - qty 1) Lot #: [id number] Exp: 04/2015  The following the learning objectives were met by the patient during this course:  Identify Pre-Op Dietary Goals and will begin 2 weeks pre-operatively  Identify appropriate sources of fluids and proteins   State protein recommendations and appropriate sources pre and post-operatively  Identify Post-Operative Dietary Goals and will follow for 2 weeks post-operatively  Identify appropriate multivitamin and calcium sources  Describe the need for physical activity post-operatively and will follow MD recommendations  State when to call healthcare provider regarding medication questions or post-operative complications  Handouts given during class include:  Pre-Op Bariatric Surgery Diet Handout  Protein Shake Handout  Post-Op Bariatric Surgery Nutrition Handout  BELT Program Information Flyer  Support Group Information Flyer  [facility] Bariatric Supplements Price List  Follow-Up Plan: Patient will follow-up at [facility] 2 weeks post operatively for diet advancement per MD.

## 2014-07-31 ENCOUNTER — Ambulatory Visit: Payer: PRIVATE HEALTH INSURANCE | Admitting: Psychology

## 2014-08-05 ENCOUNTER — Telehealth: Payer: Self-pay | Admitting: Cardiovascular Disease

## 2014-08-05 NOTE — Telephone Encounter (Signed)
Request for surgical clearance:  1. What type of surgery is being performed? Bariactric Sleeve  2. When is this surgery scheduled? 5/31  3. Are there any medications that need to be held prior to surgery and how long?Eliquis   4. Name of physician performing surgery? Dr. Johnathan Hausen  5. What is your office phone and fax number? Fax [phone] 6.  7.

## 2014-08-05 NOTE — Telephone Encounter (Signed)
FORWARD TO DR Gwenlyn Found AND Brita Romp RN

## 2014-08-06 NOTE — Telephone Encounter (Signed)
Cleared for her surgery at low risk. She has a history of normal coronary arteries by cath the last several years. She can come off her oral anti-coagulation since she has PAF.

## 2014-08-07 NOTE — Telephone Encounter (Signed)
CALLED SPOKE TO SYLVIA AT [facility] TRIAGE  SEND INFORMATION TO ATTN CHRISTY  FAX [phone] INFORMATION ROUTED

## 2014-08-08 ENCOUNTER — Telehealth: Payer: Self-pay | Admitting: Cardiovascular Disease

## 2014-08-08 ENCOUNTER — Telehealth: Payer: Self-pay | Admitting: Dietician

## 2014-08-08 NOTE — Progress Notes (Signed)
Please put orders in Epic surgery 08-20-14 pre op 08-15-14 Thanks 

## 2014-08-08 NOTE — Telephone Encounter (Signed)
Will forward to kristin pharm md to advise

## 2014-08-08 NOTE — Telephone Encounter (Signed)
Spoke with pt, aware of instructions

## 2014-08-08 NOTE — Telephone Encounter (Signed)
[name] is calling to find out when she is supposed to stop taking her Eliquis as well as her other blood thinners, before her bariatric surgery on May 31,2016. Please call if you have any questions    Thanks

## 2014-08-08 NOTE — Telephone Encounter (Signed)
Talked with Rip Harbour about trying Unflavored Unjury in [employer], Flinstones Complete, and Citrical Petites for vitamins and supplements.

## 2014-08-08 NOTE — Telephone Encounter (Signed)
Ok to hold Eliquis x 48 hours prior to surgery.  Restart 24-48 hours after at discretion of surgeon. CHADS2 score of 1, platelets ok.  Per protocol.

## 2014-08-13 DIAGNOSIS — G894 Chronic pain syndrome: Secondary | ICD-10-CM | POA: Diagnosis not present

## 2014-08-13 DIAGNOSIS — M1990 Unspecified osteoarthritis, unspecified site: Secondary | ICD-10-CM | POA: Diagnosis not present

## 2014-08-13 DIAGNOSIS — M797 Fibromyalgia: Secondary | ICD-10-CM | POA: Diagnosis not present

## 2014-08-13 DIAGNOSIS — Z79899 Other long term (current) drug therapy: Secondary | ICD-10-CM | POA: Diagnosis not present

## 2014-08-13 DIAGNOSIS — M961 Postlaminectomy syndrome, not elsewhere classified: Secondary | ICD-10-CM | POA: Diagnosis not present

## 2014-08-13 DIAGNOSIS — M792 Neuralgia and neuritis, unspecified: Secondary | ICD-10-CM | POA: Diagnosis not present

## 2014-08-14 ENCOUNTER — Ambulatory Visit: Payer: Self-pay | Admitting: Surgery

## 2014-08-14 NOTE — Progress Notes (Signed)
Please place orders in [facility] for surgery on 08/20/14.  Preop on 08/15/14 at 200pm.  Thank You.

## 2014-08-14 NOTE — Patient Instructions (Addendum)
[name]  08/14/2014   Your procedure is scheduled on:     08/20/2014    Report to [facility] Main  Entrance and follow signs to               [place] at     [phone].  Call this number if you have problems the morning of surgery [phone]   Remember: ONLY 1 PERSON MAY GO WITH YOU TO SHORT STAY TO GET  READY MORNING OF YOUR SURGERY.  Do not eat food or drink liquids :After Midnight.     Take these medicines the morning of surgery with A SIP OF WATER:    Albuterol inhaler if needed and bring, Pulmicort nebulizer if needed, Xopenex neb is needed, Diltiazem ( Cardiazem), Protonix,                                You may not have any metal on your body including hair pins and              piercings  Do not wear jewelry, make-up, lotions, powders or perfumes, deodorant             Do not wear nail polish.  Do not shave  48 hours prior to surgery.     Do not bring valuables to the hospital. [place].  Contacts, dentures or bridgework may not be worn into surgery.  Leave suitcase in the car. After surgery it may be brought to your room.         Special Instructions: coughing and deep breathing exercises, leg exercises               Please read over the following fact sheets you were given: _____________________________________________________________________             [facility] - Preparing for Surgery Before surgery, you can play an important role.  Because skin is not sterile, your skin needs to be as free of germs as possible.  You can reduce the number of germs on your skin by washing with CHG (chlorahexidine gluconate) soap before surgery.  CHG is an antiseptic cleaner which kills germs and bonds with the skin to continue killing germs even after washing. Please DO NOT use if you have an allergy to CHG or antibacterial soaps.  If your skin becomes reddened/irritated stop using the CHG and  inform your nurse when you arrive at Short Stay. Do not shave (including legs and underarms) for at least 48 hours prior to the first CHG shower.  You may shave your face/neck. Please follow these instructions carefully:  1.  Shower with CHG Soap the night before surgery and the  morning of Surgery.  2.  If you choose to wash your hair, wash your hair first as usual with your  normal  shampoo.  3.  After you shampoo, rinse your hair and body thoroughly to remove the  shampoo.                           4.  Use CHG as you would any other liquid soap.  You can apply chg directly  to the skin and wash  Gently with a scrungie or clean washcloth.  5.  Apply the CHG Soap to your body ONLY FROM THE NECK DOWN.   Do not use on face/ open                           Wound or open sores. Avoid contact with eyes, ears mouth and genitals (private parts).                       Wash face,  Genitals (private parts) with your normal soap.             6.  Wash thoroughly, paying special attention to the area where your surgery  will be performed.  7.  Thoroughly rinse your body with warm water from the neck down.  8.  DO NOT shower/wash with your normal soap after using and rinsing off  the CHG Soap.                9.  Pat yourself dry with a clean towel.            10.  Wear clean pajamas.            11.  Place clean sheets on your bed the night of your first shower and do not  sleep with pets. Day of Surgery : Do not apply any lotions/deodorants the morning of surgery.  Please wear clean clothes to the hospital/surgery center.  FAILURE TO FOLLOW THESE INSTRUCTIONS MAY RESULT IN THE CANCELLATION OF YOUR SURGERY PATIENT SIGNATURE_________________________________  NURSE SIGNATURE__________________________________  ________________________________________________________________________

## 2014-08-14 NOTE — H&P (Signed)
Chief Complaint:  Morbid obesity and severe arthritis  History of Present Illness:  [name] is an 69 y.o. female with a BMI of 46 who has bone on bone issues in her left hip.  She has been told that she cannot have hip surgery with her current BMI and she has sought bariatric surgery and has decided to have a sleeve gastrectomy.  Informed consent has been obtained with her and her husband.  She has atrial fibrillation and will have been off her Eloquis for 1 week.    Past Medical History  Diagnosis Date  . Asthma   . Arthritis   . Hypertension   . Hypercholesteremia   . Unstable angina 09/09/2011  . HTN (hypertension) 09/09/2011  . Hyperlipemia 09/09/2011  . GERD (gastroesophageal reflux disease) 09/09/2011  . Anxiety   . Hiatal hernia   . Diverticulosis   . Antral polyp     benign  . IBS (irritable bowel syndrome)   . Obesity   . Internal hemorrhoid   . Vocal cord dysfunction 2013    swelling  . PONV (postoperative nausea and vomiting)     with big surgeries, none with endos etc  . Shortness of breath   . Tachycardia   . Paroxysmal atrial fibrillation     Past Surgical History  Procedure Laterality Date  . Total knee arthroplasty Bilateral     left 2002, right 2012  . Cholecystectomy    . Abdominal hysterectomy  1988  . Tumor excision  1964    rt. leg fatty tumor  . Shoulder surgery Right 1987  . Wrist surgery Left 1997, 2009   . Elbow surgery Left     removed bone chip  . Foot osteotomy w/ plantar fascia release Left   . Total ankle replacement Right     x 3  . Hip ilioban release Right 1997  . Total ankle replacement Right 2012  . Ankle fusion Right     x 2  . Thumb replacement Bilateral     one 2009 and one 2014  . Back surgery  [phone]    3 ruptured discs, lower back  . Nasal sinus surgery  1997  . Esophagogastroduodenoscopy [name] 03/06/2013    Procedure: ESOPHAGOGASTRODUODENOSCOPY (EGD);  Surgeon: Inda Castle, MD;  Location: [facility];   Service: Endoscopy;  Laterality: [name];  . Bravo ph study [name] 03/06/2013    Procedure: BRAVO [place] STUDY;  Surgeon: Inda Castle, MD;  Location: [facility];  Service: Endoscopy;  Laterality: [name];  . Tfc wrist Left 1997  . Left heart catheterization with coronary angiogram Bilateral 09/10/2011    Procedure: LEFT HEART CATHETERIZATION WITH CORONARY ANGIOGRAM;  Surgeon: Lorretta Harp, MD;  Location: [facility] CATH LAB;  Service: Cardiovascular;  Laterality: Bilateral;    Current Outpatient Prescriptions  Medication Sig Dispense Refill  . acetaminophen (TYLENOL) 500 MG tablet Take 1,000 mg by mouth 3 (three) times daily as needed for moderate pain or headache.     . albuterol (PROVENTIL HFA;VENTOLIN HFA) 108 (90 BASE) MCG/ACT inhaler Inhale 1-2 puffs into the lungs every 6 (six) hours as needed for wheezing or shortness of breath.    [name] apixaban (ELIQUIS) 5 MG TABS tablet Take 1 tablet (5 mg total) by mouth 2 (two) times daily. 180 tablet 3  . budesonide (PULMICORT) 0.5 MG/2ML nebulizer solution Take 2 mLs (0.5 mg total) by nebulization 2 (two) times daily. (Patient taking differently: Take 0.5 mg by nebulization 2 (two) times daily  as needed (shorntess of breathe.). ) 120 mL 12  . clorazepate (TRANXENE) 7.5 MG tablet Take 7.5 mg by mouth daily as needed for anxiety.     . cyclobenzaprine (FLEXERIL) 10 MG tablet Take 10 mg by mouth 3 (three) times daily.    . diclofenac (VOLTAREN) 75 MG EC tablet Take 75 mg by mouth 2 (two) times daily.     [name] diltiazem (CARDIZEM CD) 180 MG 24 hr capsule Take 1 capsule (180 mg total) by mouth daily. 90 capsule 3  . DULoxetine (CYMBALTA) 60 MG capsule Take 1 capsule by mouth daily with supper.    . levalbuterol (XOPENEX) 0.63 MG/3ML nebulizer solution Take 0.63 mg by nebulization every 4 (four) hours as needed for wheezing or shortness of breath.    . losartan (COZAAR) 50 MG tablet Take 50 mg by mouth 2 (two) times daily.     . metoCLOPramide (REGLAN) 10 MG tablet Take 10  mg by mouth 2 (two) times daily.     [name] oxyCODONE (ROXICODONE) 15 MG immediate release tablet Take 15 mg by mouth every 4 (four) hours as needed for pain.    . pantoprazole (PROTONIX) 40 MG tablet Take 40 mg by mouth 2 (two) times daily.     . Probiotic Product (RESTORA) CAPS Take 1 capsule by mouth daily. (Patient taking differently: Take 1 capsule by mouth daily with supper. ) 30 capsule 2  . simvastatin (ZOCOR) 20 MG tablet Take 20 mg by mouth every evening.     No current facility-administered medications for this visit.   Morphine and related Family History  Problem Relation Age of Onset  . Hypertension Mother   . Esophageal cancer Father 25  . Coronary artery disease Neg Hx   . Diabetes Mother   . Arthritis Mother   . Colon cancer Neg Hx   . Esophageal cancer Mother   . Liver disease Neg Hx   . Kidney disease Neg Hx   . Other Sister     kidney mass removed  . Heart disease Maternal Grandmother    Social History:   reports that she has been passively smoking.  She has never used smokeless tobacco. She reports that she does not drink alcohol or use illicit drugs.   REVIEW OF SYSTEMS : Negative except for see extensive problem list  Physical Exam:   There were no vitals taken for this visit. There is no weight on file to calculate BMI.  Gen:  WDWN WF NAD  Neurological: Alert and oriented to person, place, and time. Motor and sensory function is grossly intact  Head: Normocephalic and atraumatic.  Eyes: Conjunctivae are normal. Pupils are equal, round, and reactive to light. No scleral icterus.  Neck: Normal range of motion. Neck supple. No tracheal deviation or thyromegaly present.  Cardiovascular:  SR without murmurs or gallops.  No carotid bruits Breast:  Not examined Respiratory: Effort normal.  No respiratory distress. No chest wall tenderness. Breath sounds normal.  No wheezes, rales or rhonchi.  Abdomen:  obese GU:  Not examined Musculoskeletal: Normal range of  motion. Extremities are nontender. No cyanosis, edema or clubbing noted Lymphadenopathy: No cervical, preauricular, postauricular or axillary adenopathy is present Skin: Skin is warm and dry. No rash noted. No diaphoresis. No erythema. No pallor. Pscyh: Normal mood and affect. Behavior is normal. Judgment and thought content normal.   LABORATORY RESULTS: No results found for this or any previous visit (from the past 48 hour(s)).   RADIOLOGY RESULTS: No results  found.  Problem List: Patient Active Problem List   Diagnosis Date Noted  . Aftercare following joint replacement 03/28/2014  . History of artificial joint 03/28/2014  . Degenerative arthritis of hip 09/27/2013  . Paroxysmal atrial fibrillation 09/07/2013  . Tachycardia 07/03/2013  . Snoring 07/03/2013  . Diverticulosis of colon without hemorrhage 06/26/2013  . Abdominal pain, right upper quadrant 01/04/2013  . Nausea alone 01/04/2013  . Hoarseness of voice 01/04/2013  . Diarrhea 01/04/2013  . Broken toe 03/09/2012  . Dyspnea 11/05/2011  . Upper airway cough syndrome 11/05/2011  . Asthma, persistent controlled 11/05/2011  . Morbid obesity with BMI of 45.0-49.9, adult 11/05/2011  . HTN (hypertension) 09/09/2011  . Hyperlipemia 09/09/2011  . Bursitis, trochanteric 08/09/2011  . Ankle arthropathy 05/31/2011    Assessment & Plan: Morbid obesity with long history of debilitating back and orthopedic operations severely limiting her mobility.  Plan sleeve gastrectomy    Matt B. Hassell Done, MD, [facility] [phone] beeper [phone]  08/14/2014 12:09 PM

## 2014-08-15 ENCOUNTER — Encounter (HOSPITAL_COMMUNITY): Payer: Self-pay

## 2014-08-15 ENCOUNTER — Encounter (HOSPITAL_COMMUNITY)
Admission: RE | Admit: 2014-08-15 | Discharge: 2014-08-15 | Disposition: A | Discharge status: Home / Self Care | Payer: Medicare Other | Source: Ambulatory Visit | Attending: Surgery | Admitting: Surgery

## 2014-08-15 DIAGNOSIS — Z01818 Encounter for other preprocedural examination: Secondary | ICD-10-CM | POA: Insufficient documentation

## 2014-08-15 DIAGNOSIS — I480 Paroxysmal atrial fibrillation: Secondary | ICD-10-CM | POA: Insufficient documentation

## 2014-08-15 HISTORY — DX: Sleep apnea, unspecified: G47.30

## 2014-08-15 LAB — COMPREHENSIVE METABOLIC PANEL
ALK PHOS: 93 U/L (ref 38–126)
ALT: 16 U/L (ref 14–54)
AST: 23 U/L (ref 15–41)
Albumin: 3.7 g/dL (ref 3.5–5.0)
Anion gap: 10 (ref 5–15)
BUN: 29 mg/dL — ABNORMAL HIGH (ref 6–20)
CO2: 24 mmol/L (ref 22–32)
CREATININE: 0.8 mg/dL (ref 0.44–1.00)
Calcium: 9 mg/dL (ref 8.9–10.3)
Chloride: 100 mmol/L — ABNORMAL LOW (ref 101–111)
GFR calc Af Amer: >60 mL/min (ref >60)
GFR calc non Af Amer: >60 mL/min (ref >60)
GLUCOSE: 127 mg/dL — AB (ref 65–99)
Potassium: 4.1 mmol/L (ref 3.5–5.1)
Sodium: 134 mmol/L — ABNORMAL LOW (ref 135–145)
TOTAL PROTEIN: 6.5 g/dL (ref 6.5–8.1)
Total Bilirubin: 0.3 mg/dL (ref 0.3–1.2)

## 2014-08-15 LAB — CBC WITH DIFFERENTIAL/PLATELET
BASOS ABS: 0 10*3/uL (ref 0.0–0.1)
BASOS PCT: 0 % (ref 0–1)
Eosinophils Absolute: 0 10*3/uL (ref 0.0–0.7)
Eosinophils Relative: 0 % (ref 0–5)
HCT: 32.7 % — ABNORMAL LOW (ref 36.0–46.0)
Hemoglobin: 9.8 g/dL — ABNORMAL LOW (ref 12.0–15.0)
LYMPHS ABS: 1.4 10*3/uL (ref 0.7–4.0)
Lymphocytes Relative: 19 % (ref 12–46)
MCH: 23.8 pg — ABNORMAL LOW (ref 26.0–34.0)
MCHC: 30 g/dL (ref 30.0–36.0)
MCV: 79.4 fL (ref 78.0–100.0)
Monocytes Absolute: 0.5 10*3/uL (ref 0.1–1.0)
Monocytes Relative: 7 % (ref 3–12)
NEUTROS PCT: 74 % (ref 43–77)
Neutro Abs: 5.2 10*3/uL (ref 1.7–7.7)
Platelets: 257 10*3/uL (ref 150–400)
RBC: 4.12 MIL/uL (ref 3.87–5.11)
RDW: 17.2 % — ABNORMAL HIGH (ref 11.5–15.5)
WBC: 7.1 10*3/uL (ref 4.0–10.5)

## 2014-08-15 LAB — PROTIME-INR
INR: 1.03 (ref 0.00–1.49)
Prothrombin Time: 13.7 seconds (ref 11.6–15.2)

## 2014-08-15 NOTE — Progress Notes (Signed)
AT preop appointment blood pressure in left arm was 87/57 and right arm 89/55.  Patient states " people have a hard time taking my blood pressure.  Sometimes they use my lower arm. Recheck with xlarge cuff and blood pressure was 112/53 right and 118/54 on left.  Patient denies any dizziness or lightheadedness.

## 2014-08-15 NOTE — Progress Notes (Addendum)
EKG- 07/03/14  EPIC  10/26/13 ECHO- EPIC  08/03/13 PFT- EPIC  11/01/13 Sleep Study Documents - EPIC  2V CXR- 07/03/2014 EPIC  04/24/14- LOV with NP in Cardiology EPIC  Telephone note regarding Eliquis in EPIc.

## 2014-08-15 NOTE — Progress Notes (Signed)
CMP report faxed to Dr Hassell Done via [facility]

## 2014-08-15 NOTE — Progress Notes (Signed)
CBC results done 08/15/14 faxed to Dr Hassell Done via [facility].

## 2014-08-20 ENCOUNTER — Encounter (HOSPITAL_COMMUNITY): Payer: Self-pay | Admitting: *Deleted

## 2014-08-20 ENCOUNTER — Inpatient Hospital Stay (HOSPITAL_COMMUNITY)
Admission: RE | Admit: 2014-08-20 | Discharge: 2014-08-24 | DRG: 621 | Disposition: A | Discharge status: Home Health | Payer: Medicare Other | Source: Ambulatory Visit | Attending: Surgery | Admitting: Surgery

## 2014-08-20 ENCOUNTER — Inpatient Hospital Stay (HOSPITAL_COMMUNITY): Payer: Medicare Other | Admitting: Certified Registered Nurse Anesthetist

## 2014-08-20 ENCOUNTER — Encounter (HOSPITAL_COMMUNITY)
Admission: RE | Disposition: A | Discharge status: Home Health | Payer: Self-pay | Source: Ambulatory Visit | Attending: Surgery

## 2014-08-20 DIAGNOSIS — Z833 Family history of diabetes mellitus: Secondary | ICD-10-CM

## 2014-08-20 DIAGNOSIS — Z01812 Encounter for preprocedural laboratory examination: Secondary | ICD-10-CM

## 2014-08-20 DIAGNOSIS — E785 Hyperlipidemia, unspecified: Secondary | ICD-10-CM | POA: Diagnosis present

## 2014-08-20 DIAGNOSIS — Z79891 Long term (current) use of opiate analgesic: Secondary | ICD-10-CM

## 2014-08-20 DIAGNOSIS — I480 Paroxysmal atrial fibrillation: Secondary | ICD-10-CM | POA: Diagnosis present

## 2014-08-20 DIAGNOSIS — K589 Irritable bowel syndrome without diarrhea: Secondary | ICD-10-CM | POA: Diagnosis present

## 2014-08-20 DIAGNOSIS — Z96653 Presence of artificial knee joint, bilateral: Secondary | ICD-10-CM | POA: Diagnosis present

## 2014-08-20 DIAGNOSIS — Z800 Family history of malignant neoplasm of digestive organs: Secondary | ICD-10-CM

## 2014-08-20 DIAGNOSIS — F419 Anxiety disorder, unspecified: Secondary | ICD-10-CM | POA: Diagnosis present

## 2014-08-20 DIAGNOSIS — K449 Diaphragmatic hernia without obstruction or gangrene: Secondary | ICD-10-CM | POA: Diagnosis present

## 2014-08-20 DIAGNOSIS — Z79899 Other long term (current) drug therapy: Secondary | ICD-10-CM | POA: Diagnosis not present

## 2014-08-20 DIAGNOSIS — Z6841 Body Mass Index (BMI) 40.0 and over, adult: Secondary | ICD-10-CM

## 2014-08-20 DIAGNOSIS — Z90710 Acquired absence of both cervix and uterus: Secondary | ICD-10-CM | POA: Diagnosis not present

## 2014-08-20 DIAGNOSIS — K219 Gastro-esophageal reflux disease without esophagitis: Secondary | ICD-10-CM | POA: Diagnosis present

## 2014-08-20 DIAGNOSIS — M25559 Pain in unspecified hip: Secondary | ICD-10-CM | POA: Diagnosis present

## 2014-08-20 DIAGNOSIS — Z9884 Bariatric surgery status: Secondary | ICD-10-CM

## 2014-08-20 DIAGNOSIS — E780 Pure hypercholesterolemia: Secondary | ICD-10-CM | POA: Diagnosis present

## 2014-08-20 DIAGNOSIS — M1990 Unspecified osteoarthritis, unspecified site: Secondary | ICD-10-CM | POA: Diagnosis present

## 2014-08-20 DIAGNOSIS — Z8249 Family history of ischemic heart disease and other diseases of the circulatory system: Secondary | ICD-10-CM | POA: Diagnosis not present

## 2014-08-20 DIAGNOSIS — K2950 Unspecified chronic gastritis without bleeding: Secondary | ICD-10-CM | POA: Diagnosis not present

## 2014-08-20 DIAGNOSIS — Z9889 Other specified postprocedural states: Secondary | ICD-10-CM | POA: Diagnosis not present

## 2014-08-20 DIAGNOSIS — I10 Essential (primary) hypertension: Secondary | ICD-10-CM | POA: Diagnosis present

## 2014-08-20 DIAGNOSIS — J45909 Unspecified asthma, uncomplicated: Secondary | ICD-10-CM | POA: Diagnosis present

## 2014-08-20 HISTORY — PX: UPPER GI ENDOSCOPY: SHX6162

## 2014-08-20 HISTORY — PX: LAPAROSCOPIC GASTRIC SLEEVE RESECTION WITH HIATAL HERNIA REPAIR: SHX6512

## 2014-08-20 LAB — CREATININE, SERUM: Creatinine, Ser: 0.87 mg/dL (ref 0.44–1.00)

## 2014-08-20 LAB — CBC
HCT: 34.1 % — ABNORMAL LOW (ref 36.0–46.0)
HEMOGLOBIN: 10 g/dL — AB (ref 12.0–15.0)
MCH: 23.3 pg — AB (ref 26.0–34.0)
MCHC: 29.3 g/dL — AB (ref 30.0–36.0)
MCV: 79.3 fL (ref 78.0–100.0)
Platelets: 215 10*3/uL (ref 150–400)
RBC: 4.3 MIL/uL (ref 3.87–5.11)
RDW: 17.3 % — ABNORMAL HIGH (ref 11.5–15.5)
WBC: 10.6 10*3/uL — ABNORMAL HIGH (ref 4.0–10.5)

## 2014-08-20 LAB — MRSA PCR SCREENING: MRSA BY PCR: NEGATIVE

## 2014-08-20 SURGERY — GASTRECTOMY, SLEEVE, LAPAROSCOPIC, WITH HIATAL HERNIA REPAIR
Anesthesia: General

## 2014-08-20 MED ORDER — ACETAMINOPHEN 10 MG/ML IV SOLN
1000.0000 mg | Freq: Once | INTRAVENOUS | Status: AC
  Administered 2014-08-20: 1000 mg via INTRAVENOUS
  Filled 2014-08-20: qty 100

## 2014-08-20 MED ORDER — PROMETHAZINE HCL 25 MG/ML IJ SOLN
12.5000 mg | INTRAMUSCULAR | Status: DC | PRN
  Administered 2014-08-21: 12.5 mg via INTRAVENOUS
  Filled 2014-08-20: qty 1

## 2014-08-20 MED ORDER — ACETAMINOPHEN 160 MG/5ML PO SOLN: 325.0000 mg | ORAL | Status: DC | PRN

## 2014-08-20 MED ORDER — KCL IN DEXTROSE-NACL 20-5-0.45 MEQ/L-%-% IV SOLN
INTRAVENOUS | Status: AC
  Administered 2014-08-20: 1000 mL
  Filled 2014-08-20: qty 1000

## 2014-08-20 MED ORDER — CHLORHEXIDINE GLUCONATE CLOTH 2 % EX PADS: 6.0000 | MEDICATED_PAD | Freq: Once | CUTANEOUS | Status: DC

## 2014-08-20 MED ORDER — SODIUM CHLORIDE 0.9 % IJ SOLN
INTRAMUSCULAR | Status: AC
  Filled 2014-08-20: qty 50

## 2014-08-20 MED ORDER — ARTIFICIAL TEARS OP OINT
TOPICAL_OINTMENT | OPHTHALMIC | Status: AC
  Filled 2014-08-20: qty 3.5

## 2014-08-20 MED ORDER — SODIUM CHLORIDE 0.9 % IJ SOLN
INTRAMUSCULAR | Status: AC
  Filled 2014-08-20: qty 10

## 2014-08-20 MED ORDER — HEPARIN SODIUM (PORCINE) 5000 UNIT/ML IJ SOLN
5000.0000 [IU] | INTRAMUSCULAR | Status: AC
  Administered 2014-08-20: 5000 [IU] via SUBCUTANEOUS
  Filled 2014-08-20: qty 1

## 2014-08-20 MED ORDER — DEXTROSE 5 % IV SOLN
2.0000 g | INTRAVENOUS | Status: AC
  Administered 2014-08-20 (×2): 2 g via INTRAVENOUS

## 2014-08-20 MED ORDER — UNJURY CHICKEN SOUP POWDER
2.0000 [oz_av] | Freq: Four times a day (QID) | ORAL | Status: DC
  Administered 2014-08-23: 2 [oz_av] via ORAL
  Filled 2014-08-20 (×8): qty 27

## 2014-08-20 MED ORDER — UNJURY CHOCOLATE CLASSIC POWDER
2.0000 [oz_av] | Freq: Four times a day (QID) | ORAL | Status: DC
  Administered 2014-08-22 – 2014-08-24 (×2): 2 [oz_av] via ORAL
  Filled 2014-08-20 (×8): qty 27

## 2014-08-20 MED ORDER — 0.9 % SODIUM CHLORIDE (POUR BTL) OPTIME
TOPICAL | Status: DC | PRN
  Administered 2014-08-20: 1000 mL

## 2014-08-20 MED ORDER — LACTATED RINGERS IR SOLN
Status: DC | PRN
  Administered 2014-08-20: 1000 mL

## 2014-08-20 MED ORDER — HYDROMORPHONE HCL 1 MG/ML IJ SOLN: 0.5000 mg | INTRAMUSCULAR | Status: DC | PRN

## 2014-08-20 MED ORDER — MIDAZOLAM HCL 5 MG/5ML IJ SOLN
INTRAMUSCULAR | Status: DC | PRN
  Administered 2014-08-20 (×2): 1 mg via INTRAVENOUS

## 2014-08-20 MED ORDER — ROCURONIUM BROMIDE 100 MG/10ML IV SOLN
INTRAVENOUS | Status: DC | PRN
  Administered 2014-08-20: 30 mg via INTRAVENOUS
  Administered 2014-08-20 (×5): 10 mg via INTRAVENOUS

## 2014-08-20 MED ORDER — ONDANSETRON HCL 4 MG/2ML IJ SOLN
INTRAMUSCULAR | Status: AC
  Filled 2014-08-20: qty 2

## 2014-08-20 MED ORDER — FENTANYL CITRATE (PF) 250 MCG/5ML IJ SOLN
INTRAMUSCULAR | Status: AC
  Filled 2014-08-20: qty 5

## 2014-08-20 MED ORDER — PANTOPRAZOLE SODIUM 40 MG IV SOLR
40.0000 mg | Freq: Every day | INTRAVENOUS | Status: DC
  Administered 2014-08-20 – 2014-08-23 (×4): 40 mg via INTRAVENOUS
  Filled 2014-08-20 (×5): qty 40

## 2014-08-20 MED ORDER — UNJURY VANILLA POWDER
2.0000 [oz_av] | Freq: Four times a day (QID) | ORAL | Status: DC
  Administered 2014-08-23: 2 [oz_av] via ORAL
  Filled 2014-08-20 (×8): qty 27

## 2014-08-20 MED ORDER — ONDANSETRON HCL 4 MG/2ML IJ SOLN
4.0000 mg | INTRAMUSCULAR | Status: DC | PRN
  Administered 2014-08-20 (×2): 4 mg via INTRAVENOUS
  Filled 2014-08-20 (×2): qty 2

## 2014-08-20 MED ORDER — ROCURONIUM BROMIDE 100 MG/10ML IV SOLN
INTRAVENOUS | Status: AC
  Filled 2014-08-20: qty 1

## 2014-08-20 MED ORDER — BUPIVACAINE LIPOSOME 1.3 % IJ SUSP
20.0000 mL | Freq: Once | INTRAMUSCULAR | Status: DC
  Filled 2014-08-20: qty 20

## 2014-08-20 MED ORDER — MIDAZOLAM HCL 2 MG/2ML IJ SOLN
INTRAMUSCULAR | Status: AC
Start: 2014-08-20 — End: 2014-08-20
  Filled 2014-08-20: qty 2

## 2014-08-20 MED ORDER — BUDESONIDE 0.5 MG/2ML IN SUSP: 0.5000 mg | Freq: Two times a day (BID) | RESPIRATORY_TRACT | Status: DC | PRN

## 2014-08-20 MED ORDER — KCL IN DEXTROSE-NACL 20-5-0.45 MEQ/L-%-% IV SOLN
INTRAVENOUS | Status: DC
  Administered 2014-08-20: 100 mL/h via INTRAVENOUS
  Administered 2014-08-20 – 2014-08-22 (×5): via INTRAVENOUS
  Filled 2014-08-20 (×8): qty 1000

## 2014-08-20 MED ORDER — PROPOFOL 10 MG/ML IV BOLUS
INTRAVENOUS | Status: AC
  Filled 2014-08-20: qty 20

## 2014-08-20 MED ORDER — GLYCOPYRROLATE 0.2 MG/ML IJ SOLN
INTRAMUSCULAR | Status: AC
  Filled 2014-08-20: qty 3

## 2014-08-20 MED ORDER — NEOSTIGMINE METHYLSULFATE 10 MG/10ML IV SOLN
INTRAVENOUS | Status: AC
  Filled 2014-08-20: qty 1

## 2014-08-20 MED ORDER — LACTATED RINGERS IV SOLN
INTRAVENOUS | Status: DC | PRN
  Administered 2014-08-20 (×2): via INTRAVENOUS

## 2014-08-20 MED ORDER — LIDOCAINE HCL (CARDIAC) 20 MG/ML IV SOLN
INTRAVENOUS | Status: AC
  Filled 2014-08-20: qty 5

## 2014-08-20 MED ORDER — DEXAMETHASONE SODIUM PHOSPHATE 10 MG/ML IJ SOLN
INTRAMUSCULAR | Status: AC
  Filled 2014-08-20: qty 1

## 2014-08-20 MED ORDER — ACETAMINOPHEN 160 MG/5ML PO SOLN: 650.0000 mg | ORAL | Status: DC | PRN

## 2014-08-20 MED ORDER — FENTANYL CITRATE (PF) 100 MCG/2ML IJ SOLN
INTRAMUSCULAR | Status: DC | PRN
  Administered 2014-08-20 (×6): 50 ug via INTRAVENOUS
  Administered 2014-08-20: 100 ug via INTRAVENOUS

## 2014-08-20 MED ORDER — HYDRALAZINE HCL 20 MG/ML IJ SOLN
10.0000 mg | Freq: Four times a day (QID) | INTRAMUSCULAR | Status: DC | PRN
  Administered 2014-08-20 – 2014-08-21 (×2): 10 mg via INTRAVENOUS
  Filled 2014-08-20 (×3): qty 1

## 2014-08-20 MED ORDER — LEVALBUTEROL HCL 0.63 MG/3ML IN NEBU: 0.6300 mg | INHALATION_SOLUTION | RESPIRATORY_TRACT | Status: DC | PRN

## 2014-08-20 MED ORDER — ALBUTEROL SULFATE HFA 108 (90 BASE) MCG/ACT IN AERS: 1.0000 | INHALATION_SPRAY | Freq: Four times a day (QID) | RESPIRATORY_TRACT | Status: DC | PRN

## 2014-08-20 MED ORDER — LIDOCAINE HCL (CARDIAC) 20 MG/ML IV SOLN
INTRAVENOUS | Status: DC | PRN
  Administered 2014-08-20: 75 mg via INTRAVENOUS

## 2014-08-20 MED ORDER — CETYLPYRIDINIUM CHLORIDE 0.05 % MT LIQD
7.0000 mL | Freq: Two times a day (BID) | OROMUCOSAL | Status: DC
  Administered 2014-08-20 – 2014-08-24 (×7): 7 mL via OROMUCOSAL

## 2014-08-20 MED ORDER — HYDROMORPHONE HCL 1 MG/ML IJ SOLN
0.5000 mg | INTRAMUSCULAR | Status: DC | PRN
  Administered 2014-08-20 – 2014-08-22 (×16): 1 mg via INTRAVENOUS
  Filled 2014-08-20 (×17): qty 1

## 2014-08-20 MED ORDER — EPHEDRINE SULFATE 50 MG/ML IJ SOLN
INTRAMUSCULAR | Status: AC
  Filled 2014-08-20: qty 1

## 2014-08-20 MED ORDER — GLYCOPYRROLATE 0.2 MG/ML IJ SOLN
INTRAMUSCULAR | Status: DC | PRN
  Administered 2014-08-20: .1 mg via INTRAVENOUS
  Administered 2014-08-20: .3 mg via INTRAVENOUS

## 2014-08-20 MED ORDER — MORPHINE SULFATE 2 MG/ML IJ SOLN: 2.0000 mg | INTRAMUSCULAR | Status: DC | PRN

## 2014-08-20 MED ORDER — OXYCODONE HCL 5 MG/5ML PO SOLN
5.0000 mg | ORAL | Status: DC | PRN
  Administered 2014-08-22 – 2014-08-24 (×10): 10 mg via ORAL
  Filled 2014-08-20 (×4): qty 10
  Filled 2014-08-20: qty 50
  Filled 2014-08-20 (×5): qty 10

## 2014-08-20 MED ORDER — BUPIVACAINE LIPOSOME 1.3 % IJ SUSP
INTRAMUSCULAR | Status: DC | PRN
  Administered 2014-08-20: 20 mL

## 2014-08-20 MED ORDER — DEXTROSE 5 % IV SOLN
INTRAVENOUS | Status: AC
  Filled 2014-08-20: qty 2

## 2014-08-20 MED ORDER — ONDANSETRON HCL 4 MG/2ML IJ SOLN
INTRAMUSCULAR | Status: DC | PRN
  Administered 2014-08-20 (×2): 2 mg via INTRAVENOUS

## 2014-08-20 MED ORDER — SUCCINYLCHOLINE CHLORIDE 20 MG/ML IJ SOLN
INTRAMUSCULAR | Status: DC | PRN
  Administered 2014-08-20: 120 mg via INTRAVENOUS

## 2014-08-20 MED ORDER — PROPOFOL 10 MG/ML IV BOLUS
INTRAVENOUS | Status: DC | PRN
  Administered 2014-08-20: 175 mg via INTRAVENOUS

## 2014-08-20 MED ORDER — DEXAMETHASONE SODIUM PHOSPHATE 10 MG/ML IJ SOLN
INTRAMUSCULAR | Status: DC | PRN
  Administered 2014-08-20: 10 mg via INTRAVENOUS

## 2014-08-20 MED ORDER — HEPARIN SODIUM (PORCINE) 5000 UNIT/ML IJ SOLN
5000.0000 [IU] | Freq: Three times a day (TID) | INTRAMUSCULAR | Status: DC
  Administered 2014-08-20 – 2014-08-24 (×10): 5000 [IU] via SUBCUTANEOUS
  Filled 2014-08-20 (×15): qty 1

## 2014-08-20 MED ORDER — NEOSTIGMINE METHYLSULFATE 10 MG/10ML IV SOLN
INTRAVENOUS | Status: DC | PRN
  Administered 2014-08-20: 5 mg via INTRAVENOUS

## 2014-08-20 MED ORDER — ONDANSETRON HCL 4 MG/2ML IJ SOLN
4.0000 mg | Freq: Once | INTRAMUSCULAR | Status: AC | PRN
  Administered 2014-08-20: 4 mg via INTRAVENOUS

## 2014-08-20 SURGICAL SUPPLY — 68 items
APL SRG 32X5 SNPLK LF DISP (MISCELLANEOUS)
APPLICATOR COTTON TIP 6IN STRL (MISCELLANEOUS) ×3 IMPLANT
APPLIER CLIP 5 13 M/L LIGAMAX5 (MISCELLANEOUS) ×4
APPLIER CLIP ROT 10 11.4 M/L (STAPLE)
APPLIER CLIP ROT 13.4 12 LRG (CLIP)
APR CLP LRG 13.4X12 ROT 20 MLT (CLIP)
APR CLP MED LRG 11.4X10 (STAPLE)
APR CLP MED LRG 5 ANG JAW (MISCELLANEOUS) ×2
BLADE SURG 15 STRL LF DISP TIS (BLADE) ×2 IMPLANT
BLADE SURG 15 STRL SS (BLADE) ×4
CABLE HIGH FREQUENCY MONO STRZ (ELECTRODE) ×3 IMPLANT
CLIP APPLIE 5 13 M/L LIGAMAX5 (MISCELLANEOUS) ×1 IMPLANT
CLIP APPLIE ROT 10 11.4 M/L (STAPLE) IMPLANT
CLIP APPLIE ROT 13.4 12 LRG (CLIP) IMPLANT
DEVICE SUT QUICK LOAD TK 5 (STAPLE) ×8 IMPLANT
DEVICE SUT TI-KNOT TK 5X26 (MISCELLANEOUS) ×2 IMPLANT
DEVICE SUTURE ENDOST 10MM (ENDOMECHANICALS) ×3 IMPLANT
DEVICE TI KNOT TK5 (MISCELLANEOUS) ×1
DEVICE TROCAR PUNCTURE CLOSURE (ENDOMECHANICALS) ×4 IMPLANT
DISSECTOR BLUNT TIP ENDO 5MM (MISCELLANEOUS) ×1 IMPLANT
DRAPE CAMERA CLOSED 9X96 (DRAPES) ×4 IMPLANT
ELECT REM PT RETURN 9FT ADLT (ELECTROSURGICAL) ×4
ELECTRODE REM PT RTRN 9FT ADLT (ELECTROSURGICAL) ×2 IMPLANT
GAUZE SPONGE 4X4 12PLY STRL (GAUZE/BANDAGES/DRESSINGS) IMPLANT
GLOVE BIOGEL M 8.0 STRL (GLOVE) ×7 IMPLANT
GOWN STRL REUS W/TWL XL LVL3 (GOWN DISPOSABLE) ×19 IMPLANT
HANDLE STAPLE EGIA 4 XL (STAPLE) ×4 IMPLANT
HOLDER FOLEY CATH W/STRAP (MISCELLANEOUS) ×3 IMPLANT
HOVERMATT SINGLE USE (MISCELLANEOUS) ×4 IMPLANT
KIT BASIN OR (CUSTOM PROCEDURE TRAY) ×4 IMPLANT
LIQUID BAND (GAUZE/BANDAGES/DRESSINGS) ×3 IMPLANT
NDL SPNL 22GX3.5 QUINCKE BK (NEEDLE) ×1 IMPLANT
NEEDLE SPNL 22GX3.5 QUINCKE BK (NEEDLE) ×4 IMPLANT
PACK UNIVERSAL I (CUSTOM PROCEDURE TRAY) ×4 IMPLANT
PEN SKIN MARKING BROAD (MISCELLANEOUS) ×4 IMPLANT
QUICK LOAD TK 5 (STAPLE) ×4
RELOAD STAPLE 45 PURP MED/THCK (STAPLE) IMPLANT
RELOAD TRI 45 ART MED THCK BLK (STAPLE) ×7 IMPLANT
RELOAD TRI 45 ART MED THCK PUR (STAPLE) IMPLANT
RELOAD TRI 60 ART MED THCK BLK (STAPLE) ×13 IMPLANT
RELOAD TRI 60 ART MED THCK PUR (STAPLE) ×4 IMPLANT
SCISSORS LAP 5X45 EPIX DISP (ENDOMECHANICALS) ×3 IMPLANT
SCRUB PCMX 4 OZ (MISCELLANEOUS) ×5 IMPLANT
SEALANT SURGICAL APPL DUAL CAN (MISCELLANEOUS) IMPLANT
SET IRRIG TUBING LAPAROSCOPIC (IRRIGATION / IRRIGATOR) ×4 IMPLANT
SHEARS CURVED HARMONIC AC 45CM (MISCELLANEOUS) ×4 IMPLANT
SLEEVE ADV FIXATION 5X100MM (TROCAR) ×11 IMPLANT
SLEEVE GASTRECTOMY 36FR VISIGI (MISCELLANEOUS) ×4 IMPLANT
SOLUTION ANTI FOG 6CC (MISCELLANEOUS) ×4 IMPLANT
SPONGE LAP 18X18 X RAY DECT (DISPOSABLE) ×4 IMPLANT
STAPLER VISISTAT 35W (STAPLE) ×4 IMPLANT
SUT SURGIDAC NAB ES-9 0 48 120 (SUTURE) ×12 IMPLANT
SUT VIC AB 4-0 SH 18 (SUTURE) ×4 IMPLANT
SUT VICRYL 0 TIES 12 18 (SUTURE) ×4 IMPLANT
SYR 20CC LL (SYRINGE) ×4 IMPLANT
SYR 50ML LL SCALE MARK (SYRINGE) ×4 IMPLANT
TOWEL OR 17X26 10 PK STRL BLUE (TOWEL DISPOSABLE) ×8 IMPLANT
TOWEL OR NON WOVEN STRL DISP B (DISPOSABLE) ×4 IMPLANT
TRAY FOLEY W/METER SILVER 14FR (SET/KITS/TRAYS/PACK) ×3 IMPLANT
TROCAR ADV FIXATION 12X100MM (TROCAR) ×4 IMPLANT
TROCAR ADV FIXATION 5X100MM (TROCAR) ×4 IMPLANT
TROCAR BLADELESS 15MM (ENDOMECHANICALS) ×4 IMPLANT
TROCAR BLADELESS OPT 5 100 (ENDOMECHANICALS) ×4 IMPLANT
TUBE CALIBRATION LAPBAND (TUBING) IMPLANT
TUBING CONNECTING 10 (TUBING) ×3 IMPLANT
TUBING CONNECTING 10' (TUBING) ×1
TUBING ENDO SMARTCAP (MISCELLANEOUS) ×4 IMPLANT
TUBING FILTER THERMOFLATOR (ELECTROSURGICAL) ×4 IMPLANT

## 2014-08-20 NOTE — Anesthesia Procedure Notes (Signed)
Procedure Name: Intubation Date/Time: 08/20/2014 7:14 AM Performed by: Ofilia Neas Pre-anesthesia Checklist: Patient identified, Timeout performed, Emergency Drugs available, Suction available and Patient being monitored Patient Re-evaluated:Patient Re-evaluated prior to inductionOxygen Delivery Method: Circle system utilized Preoxygenation: Pre-oxygenation with 100% oxygen Intubation Type: IV induction Ventilation: Mask ventilation without difficulty Laryngoscope Size: Mac and 4 Grade View: Grade I Tube type: Oral Tube size: 7.5 mm Number of attempts: 1 Airway Equipment and Method: Stylet Placement Confirmation: ETT inserted through vocal cords under direct vision,  positive ETCO2 and breath sounds checked- equal and bilateral Secured at: 21 cm Tube secured with: Tape Dental Injury: Teeth and Oropharynx as per pre-operative assessment

## 2014-08-20 NOTE — Anesthesia Postprocedure Evaluation (Signed)
  Anesthesia Post-op Note  Patient: [name]  Procedure(s) Performed: Procedure(s): LAPAROSCOPIC GASTRIC SLEEVE RESECTION WITH HIATAL HERNIA  REPAIR AND  UPPER ENDOSCOPY UPPER GI ENDOSCOPY  Patient Location: PACU  Anesthesia Type:General  Level of Consciousness: awake, alert , oriented and patient cooperative  Airway and Oxygen Therapy: Patient Spontanous Breathing  Post-op Pain: mild  Post-op Assessment: Post-op Vital signs reviewed, Patient's Cardiovascular Status Stable, Respiratory Function Stable, Patent Airway, No signs of Nausea or vomiting and Pain level controlled  Post-op Vital Signs: stable  Last Vitals:  Filed Vitals:   08/20/14 1133  BP: 180/78  Pulse: 88  Temp: 36.7 C  Resp: 14    Complications: No apparent anesthesia complications

## 2014-08-20 NOTE — Transfer of Care (Signed)
Immediate Anesthesia Transfer of Care Note  Patient: [name]  Procedure(s) Performed: Procedure(s): LAPAROSCOPIC GASTRIC SLEEVE RESECTION WITH HIATAL HERNIA  REPAIR AND  UPPER ENDOSCOPY UPPER GI ENDOSCOPY  Patient Location: PACU  Anesthesia Type:General  Level of Consciousness: awake, oriented, pateint uncooperative, lethargic and responds to stimulation  Airway & Oxygen Therapy: Patient Spontanous Breathing and Patient connected to face mask oxygen  Post-op Assessment: Report given to RN, Post -op Vital signs reviewed and stable and Patient moving all extremities  Post vital signs: Reviewed and stable  Last Vitals:  Filed Vitals:   08/20/14 1030  BP: 152/63  Pulse:   Temp:   Resp:     Complications: No apparent anesthesia complications

## 2014-08-20 NOTE — Op Note (Signed)
ROSEMARY MOSSBARGER [id number] 01-12-1946 08/20/2014  Preoperative diagnosis: morbid obesity  Postoperative diagnosis: Same   Procedure: upper endoscopy   Surgeon: Leighton Ruff. Demosthenes Virnig M.D., FACS   Anesthesia: Gen.   Indications for procedure: 69 year old WF undergoing Laparoscopic Gastric Sleeve Resection and an EGD was requested to evaluate the new gastric sleeve.   Description of procedure: After we have completed the sleeve resection, I scrubbed out and obtained the Olympus endoscope. I gently placed endoscope in the patient's oropharynx and gently glided it down the esophagus without any difficulty under direct visualization. Once I was in the gastric sleeve, I insufflated the stomach with air. I was able to cannulate and advanced the scope through the gastric sleeve. I was able to cannulate the duodenum with ease. Dr. Hassell Done had placed saline in the upper abdomen. Upon further insufflation of the gastric sleeve there was no evidence of bubbles. GE junction located at 42 cm.  Upon further inspection of the gastric sleeve, the mucosa appeared normal. There is no evidence of any mucosal abnormality. The sleeve was widely patent at the angularis. There was no evidence of bleeding. The gastric sleeve was decompressed. The scope was withdrawn. The patient tolerated this portion of the procedure well. Please see Dr Earlie Server operative note for details regarding the laparoscopic gastric sleeve resection.   Leighton Ruff. Redmond Pulling, MD, FACS  General, Bariatric, & Minimally Invasive Surgery  [facility], [place]

## 2014-08-20 NOTE — Progress Notes (Signed)
Patient does not walk at home except from the bed to the door and gets in wheelchair.  Patient had a recent fall with in the last week.  She is unsteady due to the pain medications.  She was placed in the chair position in the bed and it will be done a few more times before bed time.  She is in a Progressa Bed and the bed can assist her to the standing position.  Spoke to Bariatric Coordinator and she agrees that the plan will be to progress her to the floor with a rolling walker tomorrow.  Highly recommend that PT work with this patient.

## 2014-08-20 NOTE — Anesthesia Preprocedure Evaluation (Signed)
Anesthesia Evaluation  Patient identified by MRN, date of birth, ID band Patient awake    Reviewed: Allergy & Precautions, NPO status , Patient's Chart, lab work & pertinent test results  History of Anesthesia Complications (+) PONV  Airway Mallampati: II  TM Distance: >3 FB     Dental   Pulmonary asthma , sleep apnea ,    Pulmonary exam normal       Cardiovascular hypertension, + angina Normal cardiovascular exam    Neuro/Psych    GI/Hepatic GERD-  ,  Endo/Other  Morbid obesity  Renal/GU      Musculoskeletal  (+) Arthritis -,   Abdominal   Peds  Hematology   Anesthesia Other Findings   Reproductive/Obstetrics                             Anesthesia Physical Anesthesia Plan  ASA: III  Anesthesia Plan: General   Post-op Pain Management:    Induction: Intravenous  Airway Management Planned: Oral ETT  Additional Equipment:   Intra-op Plan:   Post-operative Plan: Extubation in OR  Informed Consent: I have reviewed the patients History and Physical, chart, labs and discussed the procedure including the risks, benefits and alternatives for the proposed anesthesia with the patient or authorized representative who has indicated his/her understanding and acceptance.     Plan Discussed with: CRNA, Anesthesiologist and Surgeon  Anesthesia Plan Comments:         Anesthesia Quick Evaluation

## 2014-08-20 NOTE — Brief Op Note (Signed)
08/20/2014  10:32 AM  PATIENT:  [name]  69 y.o. female  PRE-OPERATIVE DIAGNOSIS:  MORBID OBESITY  POST-OPERATIVE DIAGNOSIS:  MORBID OBESITY  PROCEDURE:  Procedure(s): LAPAROSCOPIC GASTRIC SLEEVE RESECTION WITH HIATAL HERNIA  REPAIR AND  UPPER ENDOSCOPY UPPER GI ENDOSCOPY  SURGEON:  Surgeon(s) and Role:    * Johnathan Hausen, MD - Primary    * Greer Pickerel, MD - Assisting  PHYSICIAN ASSISTANT:   ASSISTANTS: Greer Pickerel, MD FACS   ANESTHESIA:   general  EBL:  Total I/O In: 1700 [I.V.:1700] Out: 175 [Urine:175]  BLOOD ADMINISTERED:none  DRAINS: none   LOCAL MEDICATIONS USED:  BUPIVICAINE   SPECIMEN:  Source of Specimen:  stomach sleeve gastrectomy  DISPOSITION OF SPECIMEN:  PATHOLOGY  COUNTS:  YES  TOURNIQUET:  * No tourniquets in log *  DICTATION: .Other Dictation: Dictation Number [id number]  PLAN OF CARE: Admit to inpatient   PATIENT DISPOSITION:  PACU - hemodynamically stable.   Delay start of Pharmacological VTE agent (>24hrs) due to surgical blood loss or risk of bleeding: yes

## 2014-08-20 NOTE — H&P (View-Only) (Signed)
Chief Complaint:  Morbid obesity and severe arthritis  History of Present Illness:  [name] is an 69 y.o. female with a BMI of 46 who has bone on bone issues in her left hip.  She has been told that she cannot have hip surgery with her current BMI and she has sought bariatric surgery and has decided to have a sleeve gastrectomy.  Informed consent has been obtained with her and her husband.  She has atrial fibrillation and will have been off her Eloquis for 1 week.    Past Medical History  Diagnosis Date  . Asthma   . Arthritis   . Hypertension   . Hypercholesteremia   . Unstable angina 09/09/2011  . HTN (hypertension) 09/09/2011  . Hyperlipemia 09/09/2011  . GERD (gastroesophageal reflux disease) 09/09/2011  . Anxiety   . Hiatal hernia   . Diverticulosis   . Antral polyp     benign  . IBS (irritable bowel syndrome)   . Obesity   . Internal hemorrhoid   . Vocal cord dysfunction 2013    swelling  . PONV (postoperative nausea and vomiting)     with big surgeries, none with endos etc  . Shortness of breath   . Tachycardia   . Paroxysmal atrial fibrillation     Past Surgical History  Procedure Laterality Date  . Total knee arthroplasty Bilateral     left 2002, right 2012  . Cholecystectomy    . Abdominal hysterectomy  1988  . Tumor excision  1964    rt. leg fatty tumor  . Shoulder surgery Right 1987  . Wrist surgery Left 1997, 2009   . Elbow surgery Left     removed bone chip  . Foot osteotomy w/ plantar fascia release Left   . Total ankle replacement Right     x 3  . Hip ilioban release Right 1997  . Total ankle replacement Right 2012  . Ankle fusion Right     x 2  . Thumb replacement Bilateral     one 2009 and one 2014  . Back surgery  [phone]    3 ruptured discs, lower back  . Nasal sinus surgery  1997  . Esophagogastroduodenoscopy [name] 03/06/2013    Procedure: ESOPHAGOGASTRODUODENOSCOPY (EGD);  Surgeon: Inda Castle, MD;  Location: [facility];   Service: Endoscopy;  Laterality: [name];  . Bravo ph study [name] 03/06/2013    Procedure: BRAVO [place] STUDY;  Surgeon: Inda Castle, MD;  Location: [facility] ENDOSCOPY;  Service: Endoscopy;  Laterality: [name];  . Tfc wrist Left 1997  . Left heart catheterization with coronary angiogram Bilateral 09/10/2011    Procedure: LEFT HEART CATHETERIZATION WITH CORONARY ANGIOGRAM;  Surgeon: Lorretta Harp, MD;  Location: [facility] CATH LAB;  Service: Cardiovascular;  Laterality: Bilateral;    Current Outpatient Prescriptions  Medication Sig Dispense Refill  . acetaminophen (TYLENOL) 500 MG tablet Take 1,000 mg by mouth 3 (three) times daily as needed for moderate pain or headache.     . albuterol (PROVENTIL HFA;VENTOLIN HFA) 108 (90 BASE) MCG/ACT inhaler Inhale 1-2 puffs into the lungs every 6 (six) hours as needed for wheezing or shortness of breath.    [name] apixaban (ELIQUIS) 5 MG TABS tablet Take 1 tablet (5 mg total) by mouth 2 (two) times daily. 180 tablet 3  . budesonide (PULMICORT) 0.5 MG/2ML nebulizer solution Take 2 mLs (0.5 mg total) by nebulization 2 (two) times daily. (Patient taking differently: Take 0.5 mg by nebulization 2 (two) times daily  as needed (shorntess of breathe.). ) 120 mL 12  . clorazepate (TRANXENE) 7.5 MG tablet Take 7.5 mg by mouth daily as needed for anxiety.     . cyclobenzaprine (FLEXERIL) 10 MG tablet Take 10 mg by mouth 3 (three) times daily.    . diclofenac (VOLTAREN) 75 MG EC tablet Take 75 mg by mouth 2 (two) times daily.     [name] diltiazem (CARDIZEM CD) 180 MG 24 hr capsule Take 1 capsule (180 mg total) by mouth daily. 90 capsule 3  . DULoxetine (CYMBALTA) 60 MG capsule Take 1 capsule by mouth daily with supper.    . levalbuterol (XOPENEX) 0.63 MG/3ML nebulizer solution Take 0.63 mg by nebulization every 4 (four) hours as needed for wheezing or shortness of breath.    . losartan (COZAAR) 50 MG tablet Take 50 mg by mouth 2 (two) times daily.     . metoCLOPramide (REGLAN) 10 MG tablet Take 10  mg by mouth 2 (two) times daily.     [name] oxyCODONE (ROXICODONE) 15 MG immediate release tablet Take 15 mg by mouth every 4 (four) hours as needed for pain.    . pantoprazole (PROTONIX) 40 MG tablet Take 40 mg by mouth 2 (two) times daily.     . Probiotic Product (RESTORA) CAPS Take 1 capsule by mouth daily. (Patient taking differently: Take 1 capsule by mouth daily with supper. ) 30 capsule 2  . simvastatin (ZOCOR) 20 MG tablet Take 20 mg by mouth every evening.     No current facility-administered medications for this visit.   Morphine and related Family History  Problem Relation Age of Onset  . Hypertension Mother   . Esophageal cancer Father 15  . Coronary artery disease Neg Hx   . Diabetes Mother   . Arthritis Mother   . Colon cancer Neg Hx   . Esophageal cancer Mother   . Liver disease Neg Hx   . Kidney disease Neg Hx   . Other Sister     kidney mass removed  . Heart disease Maternal Grandmother    Social History:   reports that she has been passively smoking.  She has never used smokeless tobacco. She reports that she does not drink alcohol or use illicit drugs.   REVIEW OF SYSTEMS : Negative except for see extensive problem list  Physical Exam:   There were no vitals taken for this visit. There is no weight on file to calculate BMI.  Gen:  WDWN WF NAD  Neurological: Alert and oriented to person, place, and time. Motor and sensory function is grossly intact  Head: Normocephalic and atraumatic.  Eyes: Conjunctivae are normal. Pupils are equal, round, and reactive to light. No scleral icterus.  Neck: Normal range of motion. Neck supple. No tracheal deviation or thyromegaly present.  Cardiovascular:  SR without murmurs or gallops.  No carotid bruits Breast:  Not examined Respiratory: Effort normal.  No respiratory distress. No chest wall tenderness. Breath sounds normal.  No wheezes, rales or rhonchi.  Abdomen:  obese GU:  Not examined Musculoskeletal: Normal range of  motion. Extremities are nontender. No cyanosis, edema or clubbing noted Lymphadenopathy: No cervical, preauricular, postauricular or axillary adenopathy is present Skin: Skin is warm and dry. No rash noted. No diaphoresis. No erythema. No pallor. Pscyh: Normal mood and affect. Behavior is normal. Judgment and thought content normal.   LABORATORY RESULTS: No results found for this or any previous visit (from the past 48 hour(s)).   RADIOLOGY RESULTS: No results  found.  Problem List: Patient Active Problem List   Diagnosis Date Noted  . Aftercare following joint replacement 03/28/2014  . History of artificial joint 03/28/2014  . Degenerative arthritis of hip 09/27/2013  . Paroxysmal atrial fibrillation 09/07/2013  . Tachycardia 07/03/2013  . Snoring 07/03/2013  . Diverticulosis of colon without hemorrhage 06/26/2013  . Abdominal pain, right upper quadrant 01/04/2013  . Nausea alone 01/04/2013  . Hoarseness of voice 01/04/2013  . Diarrhea 01/04/2013  . Broken toe 03/09/2012  . Dyspnea 11/05/2011  . Upper airway cough syndrome 11/05/2011  . Asthma, persistent controlled 11/05/2011  . Morbid obesity with BMI of 45.0-49.9, adult 11/05/2011  . HTN (hypertension) 09/09/2011  . Hyperlipemia 09/09/2011  . Bursitis, trochanteric 08/09/2011  . Ankle arthropathy 05/31/2011    Assessment & Plan: Morbid obesity with long history of debilitating back and orthopedic operations severely limiting her mobility.  Plan sleeve gastrectomy    Matt B. Hassell Done, MD, [facility] [phone] beeper [phone]  08/14/2014 12:09 PM

## 2014-08-20 NOTE — Interval H&P Note (Signed)
History and Physical Interval Note:  08/20/2014 7:03 AM  [name]  has presented today for surgery, with the diagnosis of MORBID OBESITY  The various methods of treatment have been discussed with the patient and family. After consideration of risks, benefits and other options for treatment, the patient has consented to  Procedure(s): LAPAROSCOPIC GASTRIC SLEEVE RESECTION ([name]) as a surgical intervention .  The patient's history has been reviewed, patient examined, no change in status, stable for surgery.  I have reviewed the patient's chart and labs.  Questions were answered to the patient's satisfaction.     Jamile Sivils B

## 2014-08-21 ENCOUNTER — Inpatient Hospital Stay (HOSPITAL_COMMUNITY): Payer: Medicare Other

## 2014-08-21 ENCOUNTER — Encounter (HOSPITAL_COMMUNITY): Payer: Self-pay | Admitting: Surgery

## 2014-08-21 DIAGNOSIS — Z9889 Other specified postprocedural states: Secondary | ICD-10-CM

## 2014-08-21 LAB — COMPREHENSIVE METABOLIC PANEL
ALK PHOS: 82 U/L (ref 38–126)
ALT: 438 U/L — ABNORMAL HIGH (ref 14–54)
AST: 354 U/L — AB (ref 15–41)
Albumin: 3.5 g/dL (ref 3.5–5.0)
Anion gap: 7 (ref 5–15)
BUN: 10 mg/dL (ref 6–20)
CO2: 27 mmol/L (ref 22–32)
CREATININE: 0.7 mg/dL (ref 0.44–1.00)
Calcium: 8.8 mg/dL — ABNORMAL LOW (ref 8.9–10.3)
Chloride: 102 mmol/L (ref 101–111)
GFR calc non Af Amer: >60 mL/min (ref >60)
Glucose, Bld: 159 mg/dL — ABNORMAL HIGH (ref 65–99)
Potassium: 4.3 mmol/L (ref 3.5–5.1)
Sodium: 136 mmol/L (ref 135–145)
Total Bilirubin: 0.4 mg/dL (ref 0.3–1.2)
Total Protein: 6.3 g/dL — ABNORMAL LOW (ref 6.5–8.1)

## 2014-08-21 LAB — CBC WITH DIFFERENTIAL/PLATELET
BASOS ABS: 0 10*3/uL (ref 0.0–0.1)
Basophils Relative: 0 % (ref 0–1)
EOS ABS: 0 10*3/uL (ref 0.0–0.7)
Eosinophils Relative: 0 % (ref 0–5)
HEMATOCRIT: 34 % — AB (ref 36.0–46.0)
HEMOGLOBIN: 10 g/dL — AB (ref 12.0–15.0)
Lymphocytes Relative: 13 % (ref 12–46)
Lymphs Abs: 1.1 10*3/uL (ref 0.7–4.0)
MCH: 23 pg — AB (ref 26.0–34.0)
MCHC: 29.4 g/dL — AB (ref 30.0–36.0)
MCV: 78.3 fL (ref 78.0–100.0)
Monocytes Absolute: 0.7 10*3/uL (ref 0.1–1.0)
Monocytes Relative: 8 % (ref 3–12)
Neutro Abs: 6.7 10*3/uL (ref 1.7–7.7)
Neutrophils Relative %: 79 % — ABNORMAL HIGH (ref 43–77)
Platelets: 178 10*3/uL (ref 150–400)
RBC: 4.34 MIL/uL (ref 3.87–5.11)
RDW: 17.1 % — AB (ref 11.5–15.5)
WBC: 8.5 10*3/uL (ref 4.0–10.5)

## 2014-08-21 LAB — HEMOGLOBIN AND HEMATOCRIT, BLOOD
HCT: 33.6 % — ABNORMAL LOW (ref 36.0–46.0)
Hemoglobin: 9.9 g/dL — ABNORMAL LOW (ref 12.0–15.0)

## 2014-08-21 MED ORDER — IOHEXOL 300 MG/ML  SOLN
50.0000 mL | Freq: Once | INTRAMUSCULAR | Status: AC | PRN
  Administered 2014-08-21: 15 mL via ORAL

## 2014-08-21 MED ORDER — LORAZEPAM 2 MG/ML IJ SOLN
1.0000 mg | Freq: Four times a day (QID) | INTRAMUSCULAR | Status: DC | PRN
  Administered 2014-08-21 – 2014-08-23 (×3): 1 mg via INTRAVENOUS
  Filled 2014-08-21 (×3): qty 1

## 2014-08-21 NOTE — Plan of Care (Signed)
Problem: Phase II Progression Outcomes Goal: Activity at appropriate level-compared to baseline (UP IN CHAIR FOR HEMODIALYSIS)  Outcome: Not Progressing Unable to ambulate pt as ordered d/t weakness. Pt uses wheelchair at home and only ambulates to get in and out of wheelchair. PT evaluation ordered.

## 2014-08-21 NOTE — Progress Notes (Signed)
8:00 PM   Checked on patient because she failed her swallow.  She has no nausea.  She has had some hiccups, which she says hurt a lot.  She has not been out of bed today.  She is severely limited in her physical activity at home.  She is essentially wheel chair bound.  She takes a fair amount of narcotic because of arthritis pain.  She does not tolerate pain well.  Her VS are stable.  There is no evidence of acute problem.  Her daughter is in the room.  I tried to answer questions about surgery and plans.    Alphonsa Overall, MD, [facility] Pager: [phone] Office phone:  [phone]

## 2014-08-21 NOTE — Op Note (Signed)
[name], [name]             ACCOUNT NO.:  [id number]  MEDICAL RECORD NO.:  [id number]  LOCATION:  1601                         FACILITY:  [facility]  PHYSICIAN:  Isabel Caprice. Hassell Done, MD  DATE OF BIRTH:  1945/12/06  DATE OF PROCEDURE:  08/20/2014 DATE OF DISCHARGE:                              OPERATIVE REPORT   PREOPERATIVE DIAGNOSIS:  Morbid obesity, trying to get needed hip surgery with body mass index about 46.  POSTOPERATIVE DIAGNOSIS:  Large hiatal hernia with morbid obesity.  PROCEDURES:  Laparoscopic repair of a large hiatal hernia with stripping of the sac and reduction of the stomach out of the chest and posterior for suture repair of the hiatus with permanent sutures, laparoscopic sleeve gastrectomy using a 36 bougie (ViSiGi tube) and upper endoscopy by Dr. Redmond Pulling.  SURGEON:  Isabel Caprice. Hassell Done, MD  ASSISTANT:  Leighton Ruff. Redmond Pulling, MD, FACS  ANESTHESIA:  General endotracheal.  OPERATIVE TIME:  2.5 hours of which approximately 1-1/2 hour was repair of the hiatal hernia.  DESCRIPTION OF PROCEDURE:  The patient was taken to room 1 and after general anesthesia was administered, the abdomen was prepped with PCMX and draped sterilely.  Access to the abdomen was achieved through the left upper quadrant with a 5 mm OptiView without difficulty.  Following inspection, she had some adhesions in her left upper quadrant, which I took down with sharp dissection.  Standard placement of trocars occurred using 5 mm initially and 2 on the left side for Dr. Redmond Pulling, 1 left of midline for the camera.  I eventually upgraded to right of midline with a 15 mm port and another 5 laterally and then a 5 in the upper midline to retract the liver.  With the [facility] retractor placed, she was found to have about the upper third of her stomach up in her chest.  We were able to pull that down without too much difficulty and then I found her to have probably replaced hepatic vessels over on the left  side with large vessel, so I ended up having an operator around those and then added some time to the dissection.  I was able to go posteriorly to define the right crus and then go anteriorly to define the right crus and then go up and strip out the sac anteriorly.  On the patient's left side, we dissected along the left crus and again mobilized the sac out of the chest on to the stomach.  Eventually, we had the esophagogastric junction in the abdomen and there seemed to be no tension.  I did not place a Penrose drain, as we did all this with just a manual traction. At that point, I went ahead and passed the [place] and aspirated the stomach.  I repaired the hiatal hernia posteriorly with 4 sutures using Endo Stitch  and tie knots, these were 0 Surgidacs.  Felt like, it was nice and reasonably snugged using the 36 tube as a calibration.  We passed that on into the pylorus.  We had previously measured 5 cm back from the pylorus.  At that point, began dividing short gastrics.  I took those all the way up to the EG junction with the  Harmonic scalpel over on the left side.  Bleeding was controlled with Harmonic scalpel.  The sleeve then proceeded using the Covidien 4.5 mm black load initially followed by subsequent 6 cm black loads, all with the DWS buttressing material.  When completed, the patient was endoscoped by Dr. Redmond Pulling, this revealed the EG junction to be in the abdomen.  The sleeve was nice and cylindrical and seemed to be fairly uniformed.  No bleeding or leaks were noted.  I then repaired intact some of the remnant of the sac to the anterior crus.  The patient tolerated the procedure well.  Port sites were injected with Exparel.  The 15 mm where this specimen was extracted was closed using the Endo Close with a 0 Vicryl.  Wounds were closed with 4-0 Vicryl with Dermabond.  The patient was taken to the recovery room in satisfactory condition.  She will go to step-down because of her  history of atrial fibrillation.  She is also on an anticoagulant chronically.  She has mobility issues and Foley will be left in at least overnight.  FINAL DIAGNOSIS:  Large hiatal hernia, morbid obesity.  Procedures were laparoscopic takedown and repair of hiatal hernia with posterior repair and a laparoscopic sleeve resection and upper endoscopy.     Isabel Caprice Hassell Done, MD     MBM/MEDQ  D:  08/20/2014  T:  08/21/2014  Job:  [id number]

## 2014-08-21 NOTE — Progress Notes (Signed)
Patient A/Ox3 with occasional and brief, ntermittent confusion. She had c/o pain during the shift and prn pain medication were given. Patient received Zofran at change of shift. Pt.began to develop redness to left upper arm and neck area. She denied any itching or shortness of breath. Pt.blood pressure was also elevated. On call with [facility] was called and notified. New orders written. Pt.had two brief episodes of SVT non-sustained with HR in 160's -170's. She was asymptomatic and was in bed. On call with [facility] was called and notified. Was told to wait for results of am labs, report any abnormal electrolytes and to also call again if patient's begins to have SVT again. Pt.is currently resting in bed.

## 2014-08-21 NOTE — Progress Notes (Addendum)
General Surgery Note  LOS: 1 day  POD -  1 Day Post-Op  Assessment/Plan: 1.  LAPAROSCOPIC GASTRIC SLEEVE RESECTION WITH HIATAL HERNIA  REPAIR AND  UPPER ENDOSCOPY - 08/20/2014 - M.Hassell Done  For morbid obesity - BMI - 55  Swallow pending - will leave in unit until swallow back  Note: Swallow showed esophagus does not empty.  Questionably secondary to [facility] repair.  Will discuss with Dr. Hassell Done.  2.  HTN 3.  Hyperlipidemia 4.  History of atrial fibrillation  History of being on Eliquis - stopped 7 days pre op 5.  Significant ortho/hip issues  She has limited mobility and uses a wheel chair for any significant movement  Will get PT to see while in the hospital 5.  DVT prophylaxis - SQ Heparin 6.  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  No nausea.   Husband at the bedside. Objective:   Filed Vitals:   08/21/14 0555  BP: 150/68  Pulse: 110  Temp:   Resp: 20     Intake/Output from previous day:  05/31 0701 - 06/01 0700 In: 3850 [I.V.:3850] Out: 3875 [Urine:3535]  Intake/Output this shift:      Physical Exam:   General: Obese WF who is alert and oriented.    HEENT: Normal. Pupils equal. .   Lungs: Clear.  (no IS at bedside - nurse to get this)   Abdomen: Large.  Soft.   Wound: Clean   Lab Results:    Recent Labs  08/20/14 1216 08/21/14 0340  WBC 10.6* 8.5  HGB 10.0* 10.0*  HCT 34.1* 34.0*  PLT 215 178    BMET   Recent Labs  08/20/14 1216 08/21/14 0340  NA  --  136  K  --  4.3  CL  --  102  CO2  --  27  GLUCOSE  --  159*  BUN  --  10  CREATININE 0.87 0.70  CALCIUM  --  8.8*    PT/INR  No results for input(s): LABPROT, INR in the last 72 hours.  ABG  No results for input(s): PHART, HCO3 in the last 72 hours.  Invalid input(s): PCO2, PO2   Studies/Results:  No results found.   Anti-infectives:   Anti-infectives    Start     Dose/Rate Route Frequency  Ordered Stop   08/20/14 0524  cefOXitin (MEFOXIN) 2 g in dextrose 5 % 50 mL IVPB     2 g 100 mL/hr over 30 Minutes Intravenous On call to O.R. 08/20/14 0524 08/20/14 0900      Alphonsa Overall, MD, FACS Pager: [facility] Office: [phone] 08/21/2014

## 2014-08-21 NOTE — Progress Notes (Signed)
Patient alert and oriented, Post op day 1.  Provided support and encouragement.  Encouraged pulmonary toilet, ambulation.  Patient unable to complete swallow study this morning due to hip pain, if UGI is repeated tomorrow, patient will need pain medication prior to going to radiology.  Patient has a history of severe bone-on-bone arthritis in the left hip, as well as bilateral knee replacement, and right ankle replacement.  Patient's home medication regimen includes oxycodone 15mg  Q4H, her pain is managed on IV Dilaudid as long as she remains still, but with movement (transporting to radiology, moving to Fluoroscopy table, etc) she will need pain medications prior to transport to complete the test.  All questions answered.  Will continue to monitor.

## 2014-08-21 NOTE — Plan of Care (Signed)
Problem: Food- and Nutrition-Related Knowledge Deficit (NB-1.1) Goal: Nutrition education Formal process to instruct or train a patient/client in a skill or to impart knowledge to help patients/clients voluntarily manage or modify food choices and eating behavior to maintain or improve health. Nutrition Education Note  Received consult for diet education per DROP protocol.   Discussed 2 week post op diet with pt. Emphasized that liquids must be non carbonated, non caffeinated, and sugar free. Fluid goals discussed. Pt to follow up with outpatient bariatric RD for further diet progression after 2 weeks. Multivitamins and minerals also reviewed. Teach back method used, pt expressed understanding, expect good compliance.   Diet: First 2 Weeks  You will see the nutritionist about two (2) weeks after your surgery. The nutritionist will increase the types of foods you can eat if you are handling liquids well:  If you have severe vomiting or nausea and cannot handle clear liquids lasting longer than 1 day, call your surgeon  Protein Shake  Drink at least 2 ounces of shake 5-6 times per day  Each serving of protein shakes (usually 8 - 12 ounces) should have a minimum of:  15 grams of protein  And no more than 5 grams of carbohydrate  Goal for protein each day:  Men = 80 grams per day  Women = 60 grams per day  Protein powder may be added to fluids such as non-fat milk or Lactaid milk or Soy milk (limit to 35 grams added protein powder per serving)   Hydration  Slowly increase the amount of water and other clear liquids as tolerated (See Acceptable Fluids)  Slowly increase the amount of protein shake as tolerated  Sip fluids slowly and throughout the day  May use sugar substitutes in small amounts (no more than 6 - 8 packets per day; i.e. Splenda)   Fluid Goal  The first goal is to drink at least 8 ounces of protein shake/drink per day (or as directed by the nutritionist); some examples of  protein shakes are [employer], [employer], EAS Edge HP, and Unjury. See handout from pre-op Bariatric Education Class:  Slowly increase the amount of protein shake you drink as tolerated  You may find it easier to slowly sip shakes throughout the day  It is important to get your proteins in first  Your fluid goal is to drink 64 - 100 ounces of fluid daily  It may take a few weeks to build up to this  32 oz (or more) should be clear liquids  And  32 oz (or more) should be full liquids (see below for examples)  Liquids should not contain sugar, caffeine, or carbonation   Clear Liquids:  Water or Sugar-free flavored water (i.e. Fruit H2O, Propel)  Decaffeinated coffee or tea (sugar-free)  Crystal Lite, Wyler's Lite, Minute Maid Lite  Sugar-free Jell-O  Bouillon or broth  Sugar-free Popsicle: *Less than 20 calories each; Limit 1 per day   Full Liquids:  Protein Shakes/Drinks + 2 choices per day of other full liquids  Full liquids must be:  No More Than 12 grams of Carbs per serving  No More Than 3 grams of Fat per serving  Strained low-fat cream soup  Non-Fat milk  Fat-free Lactaid Milk  Sugar-free yogurt (Dannon Lite & Fit, Greek yogurt)   Pt reported that she does not like any of the protein shakes or chewable vitamins. She asked what would happen if she did not do any of the recommended 2 week post-op  diet. Concern that pt may not be compliant after discharge.   [name], [place], [place]

## 2014-08-21 NOTE — Progress Notes (Signed)
Bilateral lower extremity venous duplex completed:  No obvious evidence of DVT, superficial thrombosis, or Baker's cyst.  Technically difficult study due to the patient's body habitus.   

## 2014-08-21 NOTE — Discharge Instructions (Signed)

## 2014-08-22 LAB — CBC WITH DIFFERENTIAL/PLATELET
Basophils Absolute: 0 10*3/uL (ref 0.0–0.1)
Basophils Relative: 0 % (ref 0–1)
Eosinophils Absolute: 0 10*3/uL (ref 0.0–0.7)
Eosinophils Relative: 0 % (ref 0–5)
HEMATOCRIT: 31.5 % — AB (ref 36.0–46.0)
Hemoglobin: 9.3 g/dL — ABNORMAL LOW (ref 12.0–15.0)
Lymphocytes Relative: 19 % (ref 12–46)
Lymphs Abs: 1.2 10*3/uL (ref 0.7–4.0)
MCH: 23.4 pg — ABNORMAL LOW (ref 26.0–34.0)
MCHC: 29.5 g/dL — ABNORMAL LOW (ref 30.0–36.0)
MCV: 79.3 fL (ref 78.0–100.0)
MONO ABS: 0.6 10*3/uL (ref 0.1–1.0)
Monocytes Relative: 9 % (ref 3–12)
NEUTROS ABS: 4.6 10*3/uL (ref 1.7–7.7)
Neutrophils Relative %: 72 % (ref 43–77)
PLATELETS: 119 10*3/uL — AB (ref 150–400)
RBC: 3.97 MIL/uL (ref 3.87–5.11)
RDW: 17.5 % — AB (ref 11.5–15.5)
WBC: 6.4 10*3/uL (ref 4.0–10.5)

## 2014-08-22 LAB — BASIC METABOLIC PANEL
ANION GAP: 6 (ref 5–15)
BUN: 10 mg/dL (ref 6–20)
CALCIUM: 8.5 mg/dL — AB (ref 8.9–10.3)
CO2: 29 mmol/L (ref 22–32)
Chloride: 102 mmol/L (ref 101–111)
Creatinine, Ser: 0.63 mg/dL (ref 0.44–1.00)
GFR calc Af Amer: >60 mL/min (ref >60)
Glucose, Bld: 126 mg/dL — ABNORMAL HIGH (ref 65–99)
POTASSIUM: 4.4 mmol/L (ref 3.5–5.1)
SODIUM: 137 mmol/L (ref 135–145)

## 2014-08-22 NOTE — Progress Notes (Signed)
Patient alert and oriented, pain is not controlled but not related to surgery (pain in chronic hip and leg pain). Patient is tolerating fluids, plan to advance to protein shake today.  Reviewed Gastric sleeve discharge instructions with patient and husband, patient is able to articulate understanding.  Provided information on BELT program, Support Group and [facility] outpatient pharmacy. I still have concerns about patient mobility and compliance with post-operative diet, she does not "like" protein shakes or vitamins.  Awaiting PT evaulation and treatment, patient may benefit from bed side commode, un ure of families ability to  assist patient with ADLs.  All questions answered, will continue to monitor.

## 2014-08-22 NOTE — Progress Notes (Signed)
Patient ID: [name], female   DOB: 16-Oct-1945, 69 y.o.   MRN: [id number] [facility] Progress Note:   2 Days Post-Op  Subjective: Mental status is clear.  No nausea.   Objective: Vital signs in last 24 hours: Temp:  [97.9 F (36.6 C)-99.5 F (37.5 C)] 99.4 F (37.4 C) (06/02 0752) Pulse Rate:  [92-118] 106 (06/02 0800) Resp:  [13-29] 18 (06/02 0800) BP: (114-162)/(35-70) 124/39 mmHg (06/02 0800) SpO2:  [91 %-100 %] 91 % (06/02 0800) Weight:  [122.6 kg (270 lb 4.5 oz)] 122.6 kg (270 lb 4.5 oz) (06/02 0600)  Intake/Output from previous day: 06/01 0701 - 06/02 0700 In: 2400 [I.V.:2400] Out: 1275 [Urine:1275] Intake/Output this shift: Total I/O In: 200 [I.V.:200] Out: -   Physical Exam: Work of breathing is not labored.  Main pain is in her hip.    Lab Results:  Results for orders placed or performed during the hospital encounter of 08/20/14 (from the past 48 hour(s))  MRSA PCR Screening     Status: None   Collection Time: 08/20/14 11:37 AM  Result Value Ref Range   MRSA by PCR NEGATIVE NEGATIVE    Comment:        The GeneXpert MRSA Assay (FDA approved for NASAL specimens only), is one component of a comprehensive MRSA colonization surveillance program. It is not intended to diagnose MRSA infection nor to guide or monitor treatment for MRSA infections.   CBC     Status: Abnormal   Collection Time: 08/20/14 12:16 PM  Result Value Ref Range   WBC 10.6 (H) 4.0 - 10.5 K/uL   RBC 4.30 3.87 - 5.11 MIL/uL   Hemoglobin 10.0 (L) 12.0 - 15.0 g/dL   HCT 34.1 (L) 36.0 - 46.0 %   MCV 79.3 78.0 - 100.0 fL   MCH 23.3 (L) 26.0 - 34.0 pg   MCHC 29.3 (L) 30.0 - 36.0 g/dL   RDW 17.3 (H) 11.5 - 15.5 %   Platelets 215 150 - 400 K/uL  Creatinine, serum     Status: None   Collection Time: 08/20/14 12:16 PM  Result Value Ref Range   Creatinine, Ser 0.87 0.44 - 1.00 mg/dL   GFR calc non Af Amer >60 >60 mL/min   GFR calc Af Amer >60 >60 mL/min    Comment:  (NOTE) The eGFR has been calculated using the CKD EPI equation. This calculation has not been validated in all clinical situations. eGFR's persistently <60 mL/min signify possible Chronic Kidney Disease.   CBC WITH DIFFERENTIAL     Status: Abnormal   Collection Time: 08/21/14  3:40 AM  Result Value Ref Range   WBC 8.5 4.0 - 10.5 K/uL   RBC 4.34 3.87 - 5.11 MIL/uL   Hemoglobin 10.0 (L) 12.0 - 15.0 g/dL   HCT 34.0 (L) 36.0 - 46.0 %   MCV 78.3 78.0 - 100.0 fL   MCH 23.0 (L) 26.0 - 34.0 pg   MCHC 29.4 (L) 30.0 - 36.0 g/dL   RDW 17.1 (H) 11.5 - 15.5 %   Platelets 178 150 - 400 K/uL   Neutrophils Relative % 79 (H) 43 - 77 %   Neutro Abs 6.7 1.7 - 7.7 K/uL   Lymphocytes Relative 13 12 - 46 %   Lymphs Abs 1.1 0.7 - 4.0 K/uL   Monocytes Relative 8 3 - 12 %   Monocytes Absolute 0.7 0.1 - 1.0 K/uL   Eosinophils Relative 0 0 - 5 %   Eosinophils Absolute 0.0  0.0 - 0.7 K/uL   Basophils Relative 0 0 - 1 %   Basophils Absolute 0.0 0.0 - 0.1 K/uL  Comprehensive metabolic panel     Status: Abnormal   Collection Time: 08/21/14  3:40 AM  Result Value Ref Range   Sodium 136 135 - 145 mmol/L   Potassium 4.3 3.5 - 5.1 mmol/L   Chloride 102 101 - 111 mmol/L   CO2 27 22 - 32 mmol/L   Glucose, Bld 159 (H) 65 - 99 mg/dL   BUN 10 6 - 20 mg/dL   Creatinine, Ser 0.70 0.44 - 1.00 mg/dL   Calcium 8.8 (L) 8.9 - 10.3 mg/dL   Total Protein 6.3 (L) 6.5 - 8.1 g/dL   Albumin 3.5 3.5 - 5.0 g/dL   AST 354 (H) 15 - 41 U/L   ALT 438 (H) 14 - 54 U/L   Alkaline Phosphatase 82 38 - 126 U/L   Total Bilirubin 0.4 0.3 - 1.2 mg/dL   GFR calc non Af Amer >60 >60 mL/min   GFR calc Af Amer >60 >60 mL/min    Comment: (NOTE) The eGFR has been calculated using the CKD EPI equation. This calculation has not been validated in all clinical situations. eGFR's persistently <60 mL/min signify possible Chronic Kidney Disease.    Anion gap 7 5 - 15  Hemoglobin and hematocrit, blood     Status: Abnormal   Collection Time:  08/21/14  4:03 PM  Result Value Ref Range   Hemoglobin 9.9 (L) 12.0 - 15.0 g/dL   HCT 33.6 (L) 36.0 - 46.0 %  CBC with Differential     Status: Abnormal   Collection Time: 08/22/14  4:11 AM  Result Value Ref Range   WBC 6.4 4.0 - 10.5 K/uL   RBC 3.97 3.87 - 5.11 MIL/uL   Hemoglobin 9.3 (L) 12.0 - 15.0 g/dL   HCT 31.5 (L) 36.0 - 46.0 %   MCV 79.3 78.0 - 100.0 fL   MCH 23.4 (L) 26.0 - 34.0 pg   MCHC 29.5 (L) 30.0 - 36.0 g/dL   RDW 17.5 (H) 11.5 - 15.5 %   Platelets 119 (L) 150 - 400 K/uL    Comment: SPECIMEN CHECKED FOR CLOTS REPEATED TO VERIFY PLATELET COUNT CONFIRMED BY SMEAR    Neutrophils Relative % 72 43 - 77 %   Lymphocytes Relative 19 12 - 46 %   Monocytes Relative 9 3 - 12 %   Eosinophils Relative 0 0 - 5 %   Basophils Relative 0 0 - 1 %   Neutro Abs 4.6 1.7 - 7.7 K/uL   Lymphs Abs 1.2 0.7 - 4.0 K/uL   Monocytes Absolute 0.6 0.1 - 1.0 K/uL   Eosinophils Absolute 0.0 0.0 - 0.7 K/uL   Basophils Absolute 0.0 0.0 - 0.1 K/uL  Basic metabolic panel     Status: Abnormal   Collection Time: 08/22/14  4:11 AM  Result Value Ref Range   Sodium 137 135 - 145 mmol/L   Potassium 4.4 3.5 - 5.1 mmol/L   Chloride 102 101 - 111 mmol/L   CO2 29 22 - 32 mmol/L   Glucose, Bld 126 (H) 65 - 99 mg/dL   BUN 10 6 - 20 mg/dL   Creatinine, Ser 0.63 0.44 - 1.00 mg/dL   Calcium 8.5 (L) 8.9 - 10.3 mg/dL   GFR calc non Af Amer >60 >60 mL/min   GFR calc Af Amer >60 >60 mL/min    Comment: (NOTE) The eGFR has been  calculated using the CKD EPI equation. This calculation has not been validated in all clinical situations. eGFR's persistently <60 mL/min signify possible Chronic Kidney Disease.    Anion gap 6 5 - 15    Radiology/Results: Dg Ugi W/water Sol Cm  08/21/2014   CLINICAL DATA:  Postop day 1 status post laparoscopic sleeve gastrectomy.  EXAM: WATER SOLUBLE UPPER GI SERIES  TECHNIQUE: Single-column upper GI series was performed using water soluble contrast.  CONTRAST:  28m OMNIPAQUE  IOHEXOL 300 MG/ML  SOLN  COMPARISON:  07/03/2014  FLUOROSCOPY TIME:  Radiation Exposure Index (as provided by the fluoroscopic device):  If the device does not provide the exposure index:  Fluoroscopy Time (in minutes and seconds):  3 minutes, 34 seconds  Number of Acquired Images:  0  FINDINGS: When I entered the room, the patient was crying and moaning. She complained of discomfort all over her body, but especially in her hips, knees, and back. She was unable to tolerate having the table lifted up more than about 20 degrees from horizontal.  Initial scout image demonstrates bandlike opacities at the lung bases favoring atelectasis.  I administered 50 cc of Omnipaque 300 to the patient using a straw and holding the cup for her, since she was unable to tolerate upright positioning or controlling the cup.  Over an observation time of greater than 11 minutes, the contrast was noted to extend into the distal esophagus but never into the stomach. I tried various maneuvers such as turning the patient into the RPO position, and tilting her out as much as she could tolerate (to about 20 degrees from the horizontal), but there was stasis of contrast in the distal esophagus with occasional tertiary contractions and to and fro motion of the contrast in the esophagus. Given the prolonged observation and desire to minimize radiation exposure, as well as the patient's active complaints and demand that the procedure be terminated, the exam was ended at this point prior to filling of the stomach.  IMPRESSION: 1. Stasis of contrast medium in the esophagus without passage to the stomach despite prolonged observation. The patient was complaining of pain in her back, knees, and hips throughout the exam despite supine positioning, as well as crying throughout the exam. She was completely unable to tolerate the type of upright positioning which may have allowed gravity to drain the esophagus to the stomach. Accordingly, I am uncertain  how much of the appearance today might be due to actual narrowing at the gastroesophageal junction versus physiologic dysmotility of the esophagus. Clearly, as no contrast was observed to enter the stomach, we are not able to issue an opinion as to the presence or absence of gastric leak. Options for further workup might include CT scan or a repeat study with the patient under much better pain control common to see if standing the patient up allows the esophagus to drain.   Electronically Signed   By: WVan ClinesM.D.   On: 08/21/2014 10:40    Anti-infectives: Anti-infectives    Start     Dose/Rate Route Frequency Ordered Stop   08/20/14 0524  cefOXitin (MEFOXIN) 2 g in dextrose 5 % 50 mL IVPB     2 g 100 mL/hr over 30 Minutes Intravenous On call to O.R. 08/20/14 0524 08/20/14 0900      Assessment/Plan: Problem List: Patient Active Problem List   Diagnosis Date Noted  . 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 Aftercare following joint replacement 03/28/2014  .  History of artificial joint 03/28/2014  . Degenerative arthritis of hip 09/27/2013  . Paroxysmal atrial fibrillation 09/07/2013  . Tachycardia 07/03/2013  . Snoring 07/03/2013  . Diverticulosis of colon without hemorrhage 06/26/2013  . Abdominal pain, right upper quadrant 01/04/2013  . Nausea alone 01/04/2013  . Hoarseness of voice 01/04/2013  . Diarrhea 01/04/2013  . Broken toe 03/09/2012  . Dyspnea 11/05/2011  . Upper airway cough syndrome 11/05/2011  . Asthma, persistent controlled 11/05/2011  . Morbid obesity with BMI of 45.0-49.9, adult 11/05/2011  . HTN (hypertension) 09/09/2011  . Hyperlipemia 09/09/2011  . Bursitis, trochanteric 08/09/2011  . Ankle arthropathy 05/31/2011    UGI reviewed and results are not surprising because of the size of her hiatal hernia.  Will try her on sips of clears-PD1 diet.  Needs to get up into chair at least.   2 Days Post-Op    LOS: 2 days   Matt B.  Hassell Done, MD, [facility] [phone] beeper [phone]  08/22/2014 8:37 AM

## 2014-08-22 NOTE — Progress Notes (Signed)
Date:  August 22, 2014 U.R. performed for needs and level of care. Will continue to follow for Case Management needs.  Velva Harman, RN, BSN, [place]   [phone]

## 2014-08-22 NOTE — Care Management Note (Signed)
Case Management Note  Patient Details  Name: [name] MRN: [id number] Date of Birth: 19-Mar-1946  Subjective/Objective:                 Gastric sleeve with complications   Action/Plan: home when stable   Expected Discharge Date:        [id number]          Expected Discharge Plan:     In-House Referral:     Discharge planning Services     Post Acute Care Choice:    Choice offered to:     DME Arranged:    DME Agency:     HH Arranged:    [place] Agency:     Status of Service:     Medicare Important Message Given:    Date Medicare IM Given:    Medicare IM give by:    Date Additional Medicare IM Given:    Additional Medicare Important Message give by:     If discussed at [place] of Stay Meetings, dates discussed:    Additional Comments:  Leeroy Cha, RN 08/22/2014, 9:15 AM

## 2014-08-22 NOTE — Progress Notes (Deleted)
Paged MD and called box to let them know of increased HR and agitation still.

## 2014-08-22 NOTE — Progress Notes (Signed)
Spoke with Dr. Hassell Done re: patient's progress and motivation.  Concerned that patient may not be appropriate for discharge to home as she is unable to ambulate even 3 steps without 4+assist.  She is awaiting PT evaluation.  Informed Dr. Hassell Done of patients reluctance to move and immediate desire to return to the bed when placed in chair.  I discussed with patient the importance of movement and my desire that she spend time in the chair this afternoon while drinking her clear fluids.  Patient husband agreeable to my request that she stay in chair for 1 hour.  Will continue to monitor

## 2014-08-22 NOTE — Evaluation (Signed)
Physical Therapy Evaluation Patient Details Name: [name] MRN: [id number] DOB: [birth year] Today's Date: 08/22/2014   History of Present Illness  s/p laparoscopic gastric sleeve.  Pt with hx of Bil TKR, wrist replacement and need for Bil THR (L hip worse than R)  Clinical Impression  Pt s/p laparoscopic gastric sleeve presents with post op pain, generalized weakness, obesity, gross deconditioning, and painful arthritic hips limiting functional mobility.  Dependent on acute stay progress, pt may benefit from follow up rehab at SNF level to maximize IND. Pt and spouse (who assisted extensively prior to surgery) hope and prefer to dc home.  HHPT would be beneficial if pt progresses to home dc.    Follow Up Recommendations Home health PT;SNF (dependent on acute stay progress)    Equipment Recommendations  None recommended by PT    Recommendations for Other Services OT consult     Precautions / Restrictions Precautions Precautions: Fall Restrictions Weight Bearing Restrictions: No      Mobility  Bed Mobility Overal bed mobility: Needs Assistance             General bed mobility comments: Pt assisted to EOB by RN and spouse  Transfers Overall transfer level: Needs assistance Equipment used: Rolling walker (2 wheeled) Transfers: Sit to/from Stand Sit to Stand: Min assist;From elevated surface         General transfer comment: Cues for LE management and use of UEs to self assist  Ambulation/Gait Ambulation/Gait assistance: Min assist;+2 safety/equipment Ambulation Distance (Feet): 5 Feet Assistive device: Rolling walker (2 wheeled) Gait Pattern/deviations: Decreased step length - right;Decreased step length - left;Step-to pattern;Step-through pattern;Shuffle;Trunk flexed Gait velocity: decr   General Gait Details: cues for posture, position from RW and sequence  Stairs            Wheelchair Mobility    Modified Rankin (Stroke Patients Only)        Balance Overall balance assessment: Needs assistance Sitting-balance support: No upper extremity supported;Feet supported Sitting balance-Leahy Scale: Good     Standing balance support: Bilateral upper extremity supported Standing balance-Leahy Scale: Poor                               Pertinent Vitals/Pain Pain Assessment: Faces Faces Pain Scale: Hurts whole lot Pain Location: Hips - L>R Pain Descriptors / Indicators: Aching;Sharp Pain Intervention(s): Limited activity within patient's tolerance;Monitored during session;Premedicated before session    Home Living Family/patient expects to be discharged to:: Private residence Living Arrangements: Spouse/significant other Available Help at Discharge: Family Type of Home: House Home Access: [place]: [place]: Environmental consultant - 2 wheels;Walker - 4 wheels;Bedside commode;Wheelchair - manual      Prior Function Level of Independence: Needs assistance   Gait / Transfers Assistance Needed: Husband assists with limited gait.  Spouse states pt ambulates max of 15' to get to bathroom or to bedroom door to sit in [facility]  ADL's / Homemaking Assistance Needed: Spouse assists or completes task himself  Comments: Pt very limited 2* arthritic hips and obesity     Hand Dominance        Extremity/Trunk Assessment   Upper Extremity Assessment: Generalized weakness           Lower Extremity Assessment: Generalized weakness;RLE deficits/detail;LLE deficits/detail RLE Deficits / Details: Not fully assessed 2* pt fatigue level and low tolerance LLE Deficits / Details: Not fully assessed 2* pt fatigue  level and low tolerance     Communication   Communication: No difficulties  Cognition Arousal/Alertness: Awake/alert Behavior During Therapy: WFL for tasks assessed/performed Overall Cognitive Status: Within Functional Limits for tasks assessed                      General  Comments      Exercises        Assessment/Plan    PT Assessment Patient needs continued PT services  PT Diagnosis Difficulty walking   PT Problem List Decreased strength;Decreased range of motion;Decreased activity tolerance;Decreased balance;Decreased mobility;Decreased knowledge of use of DME;Obesity;Pain  PT Treatment Interventions DME instruction;Gait training;Functional mobility training;Therapeutic activities;Therapeutic exercise;Patient/family education   PT Goals (Current goals can be found in the Care Plan section) Acute Rehab PT Goals Patient Stated Goal: HOME PT Goal Formulation: With patient/family Time For Goal Achievement: 09/05/14 Potential to Achieve Goals: Fair    Frequency Min 3X/week   Barriers to discharge        Co-evaluation               End of Session   Activity Tolerance: Patient limited by fatigue;Patient limited by pain Patient left: in chair;with call bell/phone within reach;with family/visitor present;with nursing/sitter in room Nurse Communication: Mobility status         Time: 1510-1531 PT Time Calculation (min) (ACUTE ONLY): 21 min   Charges:   PT Evaluation $Initial PT Evaluation Tier I: 1 Procedure     PT G Codes:        Juston Goheen 09/09/14, 5:21 PM

## 2014-08-23 LAB — CBC WITH DIFFERENTIAL/PLATELET
Basophils Absolute: 0 10*3/uL (ref 0.0–0.1)
Basophils Relative: 0 % (ref 0–1)
EOS ABS: 0 10*3/uL (ref 0.0–0.7)
Eosinophils Relative: 0 % (ref 0–5)
HEMATOCRIT: 30.3 % — AB (ref 36.0–46.0)
HEMOGLOBIN: 8.9 g/dL — AB (ref 12.0–15.0)
Lymphocytes Relative: 23 % (ref 12–46)
Lymphs Abs: 1.1 10*3/uL (ref 0.7–4.0)
MCH: 22.9 pg — AB (ref 26.0–34.0)
MCHC: 29.4 g/dL — AB (ref 30.0–36.0)
MCV: 78.1 fL (ref 78.0–100.0)
Monocytes Absolute: 0.4 10*3/uL (ref 0.1–1.0)
Monocytes Relative: 9 % (ref 3–12)
Neutro Abs: 3.2 10*3/uL (ref 1.7–7.7)
Neutrophils Relative %: 67 % (ref 43–77)
Platelets: 107 10*3/uL — ABNORMAL LOW (ref 150–400)
RBC: 3.88 MIL/uL (ref 3.87–5.11)
RDW: 17.2 % — ABNORMAL HIGH (ref 11.5–15.5)
WBC: 4.7 10*3/uL (ref 4.0–10.5)

## 2014-08-23 MED ORDER — LOSARTAN POTASSIUM 50 MG PO TABS
50.0000 mg | ORAL_TABLET | Freq: Two times a day (BID) | ORAL | Status: DC
  Administered 2014-08-24: 50 mg via ORAL
  Filled 2014-08-23 (×4): qty 1

## 2014-08-23 MED ORDER — DULOXETINE HCL 60 MG PO CPEP
60.0000 mg | ORAL_CAPSULE | Freq: Every day | ORAL | Status: DC
  Filled 2014-08-23: qty 1

## 2014-08-23 NOTE — Progress Notes (Signed)
Pt transferred to 1531 via bed. Report given to dana and all questions answered.

## 2014-08-23 NOTE — Progress Notes (Signed)
OT Cancellation Note  Patient Details Name: [name] MRN: [id number] DOB: 1945-11-22   Cancelled Treatment:    Reason Eval/Treat Not Completed: Other (comment).  Pt just got back to bed.  Husband reports that pt is having a harder time moving since sx and is not oriented at this time--thinks she is in [place].  Will check back over the weekend.  Rios,Aaliyah Gavel 08/23/2014, 2:50 PM  Lesle Chris, OTR/L [phone] 08/23/2014

## 2014-08-23 NOTE — Progress Notes (Signed)
Patient ID: [name], female   DOB: 1945/05/11, 69 y.o.   MRN: [id number] [facility] Surgery Progress Note:   3 Days Post-Op  Subjective: Mental status is confused.  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 ?Ativan effect Objective: Vital signs in last 24 hours: Temp:  [97.7 F (36.5 C)-99.4 F (37.4 C)] 97.9 F (36.6 C) (06/03 0400) Pulse Rate:  [52-115] 52 (06/03 0400) Resp:  [12-25] 12 (06/03 0400) BP: (106-153)/(39-91) 124/91 mmHg (06/03 0400) SpO2:  [91 %-98 %] 98 % (06/03 0400)  Intake/Output from previous day: 06/02 0701 - 06/03 0700 In: 3520 [P.O.:1020; I.V.:2500] Out: 200 [Urine:200] Intake/Output this shift:    Physical Exam: Work of breathing is not labored.  Not complaining of much incisional pain.  Taking POs.    Lab Results:  Results for orders placed or performed during the hospital encounter of 08/20/14 (from the past 48 hour(s))  Hemoglobin and hematocrit, blood     Status: Abnormal   Collection Time: 08/21/14  4:03 PM  Result Value Ref Range   Hemoglobin 9.9 (L) 12.0 - 15.0 g/dL   HCT 33.6 (L) 36.0 - 46.0 %  CBC with Differential     Status: Abnormal   Collection Time: 08/22/14  4:11 AM  Result Value Ref Range   WBC 6.4 4.0 - 10.5 K/uL   RBC 3.97 3.87 - 5.11 MIL/uL   Hemoglobin 9.3 (L) 12.0 - 15.0 g/dL   HCT 31.5 (L) 36.0 - 46.0 %   MCV 79.3 78.0 - 100.0 fL   MCH 23.4 (L) 26.0 - 34.0 pg   MCHC 29.5 (L) 30.0 - 36.0 g/dL   RDW 17.5 (H) 11.5 - 15.5 %   Platelets 119 (L) 150 - 400 K/uL    Comment: SPECIMEN CHECKED FOR CLOTS REPEATED TO VERIFY PLATELET COUNT CONFIRMED BY SMEAR    Neutrophils Relative % 72 43 - 77 %   Lymphocytes Relative 19 12 - 46 %   Monocytes Relative 9 3 - 12 %   Eosinophils Relative 0 0 - 5 %   Basophils Relative 0 0 - 1 %   Neutro Abs 4.6 1.7 - 7.7 K/uL   Lymphs Abs 1.2 0.7 - 4.0 K/uL   Monocytes Absolute 0.6 0.1 - 1.0 K/uL   Eosinophils Absolute 0.0 0.0 - 0.7 K/uL   Basophils Absolute 0.0 0.0 - 0.1 K/uL  Basic metabolic panel      Status: Abnormal   Collection Time: 08/22/14  4:11 AM  Result Value Ref Range   Sodium 137 135 - 145 mmol/L   Potassium 4.4 3.5 - 5.1 mmol/L   Chloride 102 101 - 111 mmol/L   CO2 29 22 - 32 mmol/L   Glucose, Bld 126 (H) 65 - 99 mg/dL   BUN 10 6 - 20 mg/dL   Creatinine, Ser 0.63 0.44 - 1.00 mg/dL   Calcium 8.5 (L) 8.9 - 10.3 mg/dL   GFR calc non Af Amer >60 >60 mL/min   GFR calc Af Amer >60 >60 mL/min    Comment: (NOTE) The eGFR has been calculated using the CKD EPI equation. This calculation has not been validated in all clinical situations. eGFR's persistently <60 mL/min signify possible Chronic Kidney Disease.    Anion gap 6 5 - 15  CBC with Differential/Platelet     Status: Abnormal   Collection Time: 08/23/14  3:36 AM  Result Value Ref Range   WBC 4.7 4.0 - 10.5 K/uL   RBC 3.88 3.87 - 5.11 MIL/uL  Hemoglobin 8.9 (L) 12.0 - 15.0 g/dL   HCT 30.3 (L) 36.0 - 46.0 %   MCV 78.1 78.0 - 100.0 fL   MCH 22.9 (L) 26.0 - 34.0 pg   MCHC 29.4 (L) 30.0 - 36.0 g/dL   RDW 17.2 (H) 11.5 - 15.5 %   Platelets 107 (L) 150 - 400 K/uL    Comment: CONSISTENT WITH PREVIOUS RESULT   Neutrophils Relative % 67 43 - 77 %   Neutro Abs 3.2 1.7 - 7.7 K/uL   Lymphocytes Relative 23 12 - 46 %   Lymphs Abs 1.1 0.7 - 4.0 K/uL   Monocytes Relative 9 3 - 12 %   Monocytes Absolute 0.4 0.1 - 1.0 K/uL   Eosinophils Relative 0 0 - 5 %   Eosinophils Absolute 0.0 0.0 - 0.7 K/uL   Basophils Relative 0 0 - 1 %   Basophils Absolute 0.0 0.0 - 0.1 K/uL    Radiology/Results: Dg Ugi W/water Sol Cm  08/21/2014   CLINICAL DATA:  Postop day 1 status post laparoscopic sleeve gastrectomy.  EXAM: WATER SOLUBLE UPPER GI SERIES  TECHNIQUE: Single-column upper GI series was performed using water soluble contrast.  CONTRAST:  66m OMNIPAQUE IOHEXOL 300 MG/ML  SOLN  COMPARISON:  07/03/2014  FLUOROSCOPY TIME:  Radiation Exposure Index (as provided by the fluoroscopic device):  If the device does not provide the exposure index:   Fluoroscopy Time (in minutes and seconds):  3 minutes, 34 seconds  Number of Acquired Images:  0  FINDINGS: When I entered the room, the patient was crying and moaning. She complained of discomfort all over her body, but especially in her hips, knees, and back. She was unable to tolerate having the table lifted up more than about 20 degrees from horizontal.  Initial scout image demonstrates bandlike opacities at the lung bases favoring atelectasis.  I administered 50 cc of Omnipaque 300 to the patient using a straw and holding the cup for her, since she was unable to tolerate upright positioning or controlling the cup.  Over an observation time of greater than 11 minutes, the contrast was noted to extend into the distal esophagus but never into the stomach. I tried various maneuvers such as turning the patient into the RPO position, and tilting her out as much as she could tolerate (to about 20 degrees from the horizontal), but there was stasis of contrast in the distal esophagus with occasional tertiary contractions and to and fro motion of the contrast in the esophagus. Given the prolonged observation and desire to minimize radiation exposure, as well as the patient's active complaints and demand that the procedure be terminated, the exam was ended at this point prior to filling of the stomach.  IMPRESSION: 1. Stasis of contrast medium in the esophagus without passage to the stomach despite prolonged observation. The patient was complaining of pain in her back, knees, and hips throughout the exam despite supine positioning, as well as crying throughout the exam. She was completely unable to tolerate the type of upright positioning which may have allowed gravity to drain the esophagus to the stomach. Accordingly, I am uncertain how much of the appearance today might be due to actual narrowing at the gastroesophageal junction versus physiologic dysmotility of the esophagus. Clearly, as no contrast was observed to  enter the stomach, we are not able to issue an opinion as to the presence or absence of gastric leak. Options for further workup might include CT scan or a repeat study with  the patient under much better pain control common to see if standing the patient up allows the esophagus to drain.   Electronically Signed   By: Van Clines M.D.   On: 08/21/2014 10:40    Anti-infectives: Anti-infectives    Start     Dose/Rate Route Frequency Ordered Stop   08/20/14 0524  cefOXitin (MEFOXIN) 2 g in dextrose 5 % 50 mL IVPB     2 g 100 mL/hr over 30 Minutes Intravenous On call to O.R. 08/20/14 0524 08/20/14 0900      Assessment/Plan: Problem List: Patient Active Problem List   Diagnosis Date Noted  . 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 Aftercare following joint replacement 03/28/2014  . History of artificial joint 03/28/2014  . Degenerative arthritis of hip 09/27/2013  . Paroxysmal atrial fibrillation 09/07/2013  . Tachycardia 07/03/2013  . Snoring 07/03/2013  . Diverticulosis of colon without hemorrhage 06/26/2013  . Abdominal pain, right upper quadrant 01/04/2013  . Nausea alone 01/04/2013  . Hoarseness of voice 01/04/2013  . Diarrhea 01/04/2013  . Broken toe 03/09/2012  . Dyspnea 11/05/2011  . Upper airway cough syndrome 11/05/2011  . Asthma, persistent controlled 11/05/2011  . Morbid obesity with BMI of 45.0-49.9, adult 11/05/2011  . HTN (hypertension) 09/09/2011  . Hyperlipemia 09/09/2011  . Bursitis, trochanteric 08/09/2011  . Ankle arthropathy 05/31/2011    May need SNF placement for intensive PT.   3 Days Post-Op    LOS: 3 days   Matt B. Hassell Done, MD, [facility] [phone] beeper [phone]  08/23/2014 7:48 AM

## 2014-08-23 NOTE — Progress Notes (Signed)
Pt has increased confusion since yesterday. Pulling at wires this am and was incontinent all night with urinary urgency and frequency. Nursing attempted to toilet patient every hour.

## 2014-08-24 LAB — CBC WITH DIFFERENTIAL/PLATELET
BASOS ABS: 0 10*3/uL (ref 0.0–0.1)
Basophils Relative: 0 % (ref 0–1)
EOS ABS: 0.1 10*3/uL (ref 0.0–0.7)
Eosinophils Relative: 1 % (ref 0–5)
HCT: 28 % — ABNORMAL LOW (ref 36.0–46.0)
Hemoglobin: 8.4 g/dL — ABNORMAL LOW (ref 12.0–15.0)
LYMPHS ABS: 1.1 10*3/uL (ref 0.7–4.0)
Lymphocytes Relative: 22 % (ref 12–46)
MCH: 23.4 pg — ABNORMAL LOW (ref 26.0–34.0)
MCHC: 30 g/dL (ref 30.0–36.0)
MCV: 78 fL (ref 78.0–100.0)
MONOS PCT: 11 % (ref 3–12)
Monocytes Absolute: 0.5 10*3/uL (ref 0.1–1.0)
Neutro Abs: 3.1 10*3/uL (ref 1.7–7.7)
Neutrophils Relative %: 65 % (ref 43–77)
PLATELETS: 113 10*3/uL — AB (ref 150–400)
RBC: 3.59 MIL/uL — ABNORMAL LOW (ref 3.87–5.11)
RDW: 17.2 % — AB (ref 11.5–15.5)
WBC: 4.8 10*3/uL (ref 4.0–10.5)

## 2014-08-24 NOTE — Care Management Note (Signed)
Case Management Note  Patient Details  Name: [name] MRN: [id number] Date of Birth: 02/17/1946  Subjective/Objective:                    Action/Plan:d/c home w/[facility]PT.[facility]. No further d/c needs.   Expected Discharge Date:                  Expected Discharge Plan:  [place] ([place].TC [facility] rep Tiffany aware of d/c & HHPT order.)  In-House Referral:  NA  Discharge planning Services  CM Consult  Post Acute Care Choice:  NA Choice offered to:  NA, Patient  DME Arranged:    DME Agency:     HH Arranged:  PT [place]:  [place]  Status of Service:  Completed, signed off  Medicare Important Message Given:  Yes Date Medicare IM Given:  08/24/14 Medicare IM give by:  Dessa Phi Date Additional Medicare IM Given:    Additional Medicare Important Message give by:     If discussed at [place] of Stay Meetings, dates discussed:    Additional Comments:  Dessa Phi, RN 08/24/2014, 10:52 AM

## 2014-08-24 NOTE — Progress Notes (Signed)
Ok not to give Cozaar this time by T.Rosenbower.Pt on liquid diet only.

## 2014-08-24 NOTE — Discharge Summary (Signed)
Physician Discharge Summary  Patient ID: [name] MRN: [id number] DOB/AGE: 1945/06/28 69 y.o.  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  Observed in stepdown.  UGI showed slow emptying of esophagus.  Began tolerating clears.  Given Ativan which caused her to "visit [place]".  Medication reaction cleared.  Back to baseline and family wanted to take her home and not go to SNF for rehab.  Will abide by their wishes.  Will restart Eloquis at discharge  Consults: PT and OT  Significant Diagnostic Studies: UGI series    Discharge Exam: Blood pressure 159/88, pulse 101, temperature 98.2 F (36.8 C), temperature source Oral, resp. rate 16, height 5\' 3"  (1.6 m), weight 108.863 kg (240 lb), SpO2 97 %. Incisions OK and less sore  Disposition: 01-Home or Self Care  Discharge Instructions    Ambulate hourly while awake    Complete by:  As directed      Call MD for:  difficulty breathing, headache or visual disturbances    Complete by:  As directed      Call MD for:  persistant dizziness or light-headedness    Complete by:  As directed      Call MD for:  persistant nausea and vomiting    Complete by:  As directed      Call MD for:  redness, tenderness, or signs of infection (pain, swelling, redness, odor or green/yellow discharge around incision site)    Complete by:  As directed      Call MD for:  severe uncontrolled pain    Complete by:  As directed      Call MD for:  temperature >101 F    Complete by:  As directed      Diet bariatric full liquid    Complete by:  As directed      Discharge instructions    Complete by:  As directed   May resume Eloquis     Incentive  spirometry    Complete by:  As directed   Perform hourly while awake     No wound care    Complete by:  As directed             Medication List    STOP taking these medications        diclofenac 75 MG EC tablet  Commonly known as:  VOLTAREN      TAKE these medications        acetaminophen 500 MG tablet  Commonly known as:  TYLENOL  Take 1,000 mg by mouth 3 (three) times daily as needed for moderate pain or headache.     albuterol 108 (90 BASE) MCG/ACT inhaler  Commonly known as:  PROVENTIL HFA;VENTOLIN HFA  Inhale 1-2 puffs into the lungs every 6 (six) hours as needed for wheezing or shortness of breath.     apixaban 5 MG Tabs tablet  Commonly known as:  ELIQUIS  Take 1 tablet (5 mg total) by mouth 2 (two) times daily.     budesonide 0.5 MG/2ML nebulizer solution  Commonly known as:  PULMICORT  Take 2 mLs (0.5 mg total) by nebulization 2 (two) times daily.     clorazepate 7.5 MG tablet  Commonly  known as:  TRANXENE  Take 7.5 mg by mouth daily as needed for anxiety.     cyclobenzaprine 10 MG tablet  Commonly known as:  FLEXERIL  Take 10 mg by mouth 3 (three) times daily.     diltiazem 180 MG 24 hr capsule  Commonly known as:  CARDIZEM CD  Take 1 capsule (180 mg total) by mouth daily.     DULoxetine 60 MG capsule  Commonly known as:  CYMBALTA  Take 1 capsule by mouth daily with supper.     levalbuterol 0.63 MG/3ML nebulizer solution  Commonly known as:  XOPENEX  Take 0.63 mg by nebulization every 4 (four) hours as needed for wheezing or shortness of breath.     losartan 50 MG tablet  Commonly known as:  COZAAR  Take 50 mg by mouth 2 (two) times daily.     metoCLOPramide 10 MG tablet  Commonly known as:  REGLAN  Take 10 mg by mouth 2 (two) times daily.     oxyCODONE 15 MG immediate release tablet  Commonly known as:  ROXICODONE  Take 15 mg by mouth every 4 (four) hours as needed for pain. Patient takes 6 per day     oxyCODONE-acetaminophen 10-325 MG per  tablet  Commonly known as:  PERCOCET  Take 1 tablet by mouth every 4 (four) hours as needed for pain.     pantoprazole 40 MG tablet  Commonly known as:  PROTONIX  Take 40 mg by mouth 2 (two) times daily.     RESTORA Caps  Take 1 capsule by mouth daily.     simvastatin 20 MG tablet  Commonly known as:  ZOCOR  Take 20 mg by mouth every evening.           Follow-up Information    Follow up with Pedro Earls, MD. Go on 08/29/2014.   Specialty:  General Surgery   Why:  For Post-Op Check at 11:00AM   Contact information:   [address] [id number] [phone]       Signed: Pedro Earls 08/24/2014, 8:10 AM

## 2014-08-24 NOTE — Clinical Social Work Note (Signed)
CSW spoke with RN who stated that pt wants in home pt services  CSW called and spoke with RN/case manager Sarah to provide this information as discharge summary has been written by MD for today  .Dede Query, LCSW [facility] Clinical Social Worker - Weekend Coverage cell #: [phone]

## 2014-08-26 ENCOUNTER — Telehealth (HOSPITAL_COMMUNITY): Payer: Self-pay

## 2014-08-26 DIAGNOSIS — M25552 Pain in left hip: Secondary | ICD-10-CM | POA: Diagnosis not present

## 2014-08-26 DIAGNOSIS — Z6841 Body Mass Index (BMI) 40.0 and over, adult: Secondary | ICD-10-CM | POA: Diagnosis not present

## 2014-08-26 DIAGNOSIS — Z48815 Encounter for surgical aftercare following surgery on the digestive system: Secondary | ICD-10-CM | POA: Diagnosis not present

## 2014-08-26 DIAGNOSIS — Z7901 Long term (current) use of anticoagulants: Secondary | ICD-10-CM | POA: Diagnosis not present

## 2014-08-26 DIAGNOSIS — M25551 Pain in right hip: Secondary | ICD-10-CM | POA: Diagnosis not present

## 2014-08-26 NOTE — Telephone Encounter (Signed)
Made discharge phone call to patient per DROP protocol. Asking the following questions.    1. Do you have someone to care for you now that you are home?  yes 2. Are you having pain now that is not relieved by your pain medication?  no 3. Are you able to drink the recommended daily amount of fluids (48 ounces minimum/day) and protein (60-80 grams/day) as prescribed by the dietitian or nutritional counselor? Yes, doing well but doesn't like cold shakes  4. Are you taking the vitamins and minerals as prescribed?  yes 5. Do you have the "on call" number to contact your surgeon if you have a problem or question?  yes 6. Are your incisions free of redness, swelling or drainage? (If steri strips, address that these can fall off, shower as tolerated) no 7. Have your bowels moved since your surgery?  If not, are you passing gas?  Yes, with milk magnesia  8. Are you up and walking 3-4 times per day?  As i can -- my hip impedes movement significantly    1. Do you have an appointment made to see your surgeon in the next month?  yes 2. Were you provided your discharge medications before your surgery or before you were discharged from the hospital and are you taking them without problem?  yes 3. Were you provided phone numbers to the clinic/surgeon's office?  yes 4. Did you watch the patient education video module in the (clinic, surgeon's office, etc.) before your surgery? yes 5. Do you have a discharge checklist that was provided to you in the hospital to reference with instructions on how to take care of yourself after surgery?  yes 6. Did you see a dietitian or nutritional counselor while you were in the hospital?  yes 7. Do you have an appointment to see a dietitian or nutritional counselor in the next month?  yes

## 2014-08-28 DIAGNOSIS — Z6841 Body Mass Index (BMI) 40.0 and over, adult: Secondary | ICD-10-CM | POA: Diagnosis not present

## 2014-08-28 DIAGNOSIS — Z7901 Long term (current) use of anticoagulants: Secondary | ICD-10-CM | POA: Diagnosis not present

## 2014-08-28 DIAGNOSIS — M25551 Pain in right hip: Secondary | ICD-10-CM | POA: Diagnosis not present

## 2014-08-28 DIAGNOSIS — Z48815 Encounter for surgical aftercare following surgery on the digestive system: Secondary | ICD-10-CM | POA: Diagnosis not present

## 2014-08-28 DIAGNOSIS — M25552 Pain in left hip: Secondary | ICD-10-CM | POA: Diagnosis not present

## 2014-09-03 ENCOUNTER — Encounter: Payer: Medicare Other | Attending: Surgery

## 2014-09-03 ENCOUNTER — Other Ambulatory Visit: Payer: Self-pay | Admitting: Gastroenterology

## 2014-09-03 VITALS — Ht 63.0 in | Wt 261.6 lb

## 2014-09-03 DIAGNOSIS — Z6841 Body Mass Index (BMI) 40.0 and over, adult: Secondary | ICD-10-CM | POA: Diagnosis not present

## 2014-09-03 DIAGNOSIS — Z713 Dietary counseling and surveillance: Secondary | ICD-10-CM | POA: Insufficient documentation

## 2014-09-03 NOTE — Progress Notes (Signed)
  Follow-up visit:  2 Weeks Post-Operative Sleeve Gastrectomy Surgery  Medical Nutrition Therapy:  Appt start time: 2248 end time:  1615.  Primary concerns today: Post-operative Bariatric Surgery Nutrition Management. Returns with a 16.4 lbs since April. Started to feel hungry on Sunday. Retired and gets up at various times. Having about 4 protein shake per day.   Surgery date: 08/20/14 Surgery type: Gastric sleeve [facility] weight at [facility]: 278 lbs on 06/27/14 Weight today: 261.6 lbs Weight change: 16.4 lbs  TANITA  BODY COMP RESULTS  09/03/14   BMI (kg/m^2) no   Fat Mass (lbs) [place]   Fat Free Mass (lbs) done   Total Body Water (lbs) today     Preferred Learning Style:   No preference indicated   Learning Readiness:   Ready  24-hr recall: B (AM): Unjury chicken soup (21 g) Snk (AM): 1/2 shake (10 g) L (PM): Unjury with non fat lactaid (29 g) Snk (PM): 1/2 shake (10 g) D (PM): Unjury with non fat lactaid (29 g) Snk (PM): none  Fluid intake: 4, 8 oz shakes per day (32 oz), 24 oz water with flavoring or decaf unsweet tea with sweet and low (~56 oz)  Estimated total protein intake: ~102 g  Medications: see list  Supplementation: taking   Using straws: Yes Drinking while eating: [name] Hair loss: No  Carbonated beverages: No N/V/D/C: Constipation - just started taking Miralax  Dumping syndrome: No  Recent physical activity:  Physical therapy 1 x week, does not move much d/t pain hip  Progress Towards Goal(s):  In progress.  Handouts given during visit include:  Phase 3A High Protein   Nutritional Diagnosis:  [place]-3.3 Overweight/obesity related to past poor dietary habits and physical inactivity as evidenced by patient w/ recent sleeve gastrectomy surgery following dietary guidelines for continued weight loss.    Intervention:  Nutrition education/diet advancement.  Teaching Method Utilized:  Visual Auditory Hands on  Barriers to learning/adherence to lifestyle  change: limited mobility d/t hip pain  Demonstrated degree of understanding via:  Teach Back   Monitoring/Evaluation:  Dietary intake, exercise, lap band fills, and body weight. Follow up in 6 weeks 3 month post-op visit.

## 2014-09-04 DIAGNOSIS — Z48815 Encounter for surgical aftercare following surgery on the digestive system: Secondary | ICD-10-CM | POA: Diagnosis not present

## 2014-09-04 DIAGNOSIS — Z7901 Long term (current) use of anticoagulants: Secondary | ICD-10-CM | POA: Diagnosis not present

## 2014-09-04 DIAGNOSIS — Z6841 Body Mass Index (BMI) 40.0 and over, adult: Secondary | ICD-10-CM | POA: Diagnosis not present

## 2014-09-04 DIAGNOSIS — M25551 Pain in right hip: Secondary | ICD-10-CM | POA: Diagnosis not present

## 2014-09-04 DIAGNOSIS — M25552 Pain in left hip: Secondary | ICD-10-CM | POA: Diagnosis not present

## 2014-09-09 DIAGNOSIS — Z7901 Long term (current) use of anticoagulants: Secondary | ICD-10-CM | POA: Diagnosis not present

## 2014-09-09 DIAGNOSIS — M25551 Pain in right hip: Secondary | ICD-10-CM | POA: Diagnosis not present

## 2014-09-09 DIAGNOSIS — Z6841 Body Mass Index (BMI) 40.0 and over, adult: Secondary | ICD-10-CM | POA: Diagnosis not present

## 2014-09-09 DIAGNOSIS — Z48815 Encounter for surgical aftercare following surgery on the digestive system: Secondary | ICD-10-CM | POA: Diagnosis not present

## 2014-09-09 DIAGNOSIS — M25552 Pain in left hip: Secondary | ICD-10-CM | POA: Diagnosis not present

## 2014-09-11 ENCOUNTER — Encounter: Payer: Self-pay | Admitting: *Deleted

## 2014-09-16 ENCOUNTER — Other Ambulatory Visit: Payer: Self-pay

## 2014-09-18 DIAGNOSIS — M961 Postlaminectomy syndrome, not elsewhere classified: Secondary | ICD-10-CM | POA: Diagnosis not present

## 2014-09-18 DIAGNOSIS — M797 Fibromyalgia: Secondary | ICD-10-CM | POA: Diagnosis not present

## 2014-09-18 DIAGNOSIS — Z79899 Other long term (current) drug therapy: Secondary | ICD-10-CM | POA: Diagnosis not present

## 2014-09-18 DIAGNOSIS — M792 Neuralgia and neuritis, unspecified: Secondary | ICD-10-CM | POA: Diagnosis not present

## 2014-09-18 DIAGNOSIS — G894 Chronic pain syndrome: Secondary | ICD-10-CM | POA: Diagnosis not present

## 2014-09-18 DIAGNOSIS — M1990 Unspecified osteoarthritis, unspecified site: Secondary | ICD-10-CM | POA: Diagnosis not present

## 2014-09-25 DIAGNOSIS — M25551 Pain in right hip: Secondary | ICD-10-CM | POA: Diagnosis not present

## 2014-09-25 DIAGNOSIS — M25552 Pain in left hip: Secondary | ICD-10-CM | POA: Diagnosis not present

## 2014-09-27 DIAGNOSIS — Z48815 Encounter for surgical aftercare following surgery on the digestive system: Secondary | ICD-10-CM | POA: Diagnosis not present

## 2014-09-27 DIAGNOSIS — M25552 Pain in left hip: Secondary | ICD-10-CM | POA: Diagnosis not present

## 2014-09-27 DIAGNOSIS — M25551 Pain in right hip: Secondary | ICD-10-CM | POA: Diagnosis not present

## 2014-09-27 DIAGNOSIS — Z6841 Body Mass Index (BMI) 40.0 and over, adult: Secondary | ICD-10-CM | POA: Diagnosis not present

## 2014-09-27 DIAGNOSIS — Z7901 Long term (current) use of anticoagulants: Secondary | ICD-10-CM | POA: Diagnosis not present

## 2014-09-29 ENCOUNTER — Emergency Department (HOSPITAL_COMMUNITY)
Admission: EM | Admit: 2014-09-29 | Discharge: 2014-09-29 | Disposition: A | Discharge status: Home / Self Care | Payer: Medicare Other | Attending: Emergency Medicine | Admitting: Emergency Medicine

## 2014-09-29 ENCOUNTER — Emergency Department (HOSPITAL_COMMUNITY): Payer: Medicare Other

## 2014-09-29 ENCOUNTER — Encounter (HOSPITAL_COMMUNITY): Payer: Self-pay

## 2014-09-29 DIAGNOSIS — Z86018 Personal history of other benign neoplasm: Secondary | ICD-10-CM | POA: Insufficient documentation

## 2014-09-29 DIAGNOSIS — Z79899 Other long term (current) drug therapy: Secondary | ICD-10-CM | POA: Insufficient documentation

## 2014-09-29 DIAGNOSIS — E669 Obesity, unspecified: Secondary | ICD-10-CM | POA: Diagnosis not present

## 2014-09-29 DIAGNOSIS — R410 Disorientation, unspecified: Secondary | ICD-10-CM | POA: Diagnosis not present

## 2014-09-29 DIAGNOSIS — Z8669 Personal history of other diseases of the nervous system and sense organs: Secondary | ICD-10-CM | POA: Insufficient documentation

## 2014-09-29 DIAGNOSIS — E785 Hyperlipidemia, unspecified: Secondary | ICD-10-CM | POA: Insufficient documentation

## 2014-09-29 DIAGNOSIS — I209 Angina pectoris, unspecified: Secondary | ICD-10-CM | POA: Diagnosis not present

## 2014-09-29 DIAGNOSIS — Z8739 Personal history of other diseases of the musculoskeletal system and connective tissue: Secondary | ICD-10-CM | POA: Diagnosis not present

## 2014-09-29 DIAGNOSIS — R4182 Altered mental status, unspecified: Secondary | ICD-10-CM | POA: Insufficient documentation

## 2014-09-29 DIAGNOSIS — K219 Gastro-esophageal reflux disease without esophagitis: Secondary | ICD-10-CM | POA: Insufficient documentation

## 2014-09-29 DIAGNOSIS — J45909 Unspecified asthma, uncomplicated: Secondary | ICD-10-CM | POA: Diagnosis not present

## 2014-09-29 DIAGNOSIS — I10 Essential (primary) hypertension: Secondary | ICD-10-CM | POA: Diagnosis not present

## 2014-09-29 DIAGNOSIS — E780 Pure hypercholesterolemia: Secondary | ICD-10-CM | POA: Insufficient documentation

## 2014-09-29 LAB — COMPREHENSIVE METABOLIC PANEL
ALT: 16 U/L (ref 14–54)
ANION GAP: 12 (ref 5–15)
AST: 18 U/L (ref 15–41)
Albumin: 3.5 g/dL (ref 3.5–5.0)
Alkaline Phosphatase: 87 U/L (ref 38–126)
BILIRUBIN TOTAL: 0.4 mg/dL (ref 0.3–1.2)
BUN: 21 mg/dL — ABNORMAL HIGH (ref 6–20)
CALCIUM: 9.4 mg/dL (ref 8.9–10.3)
CHLORIDE: 102 mmol/L (ref 101–111)
CO2: 22 mmol/L (ref 22–32)
CREATININE: 0.79 mg/dL (ref 0.44–1.00)
Glucose, Bld: 130 mg/dL — ABNORMAL HIGH (ref 65–99)
Potassium: 3.7 mmol/L (ref 3.5–5.1)
SODIUM: 136 mmol/L (ref 135–145)
Total Protein: 6.3 g/dL — ABNORMAL LOW (ref 6.5–8.1)

## 2014-09-29 LAB — CBC
HEMATOCRIT: 35.4 % — AB (ref 36.0–46.0)
Hemoglobin: 10.8 g/dL — ABNORMAL LOW (ref 12.0–15.0)
MCH: 24.1 pg — ABNORMAL LOW (ref 26.0–34.0)
MCHC: 30.5 g/dL (ref 30.0–36.0)
MCV: 79 fL (ref 78.0–100.0)
Platelets: 282 10*3/uL (ref 150–400)
RBC: 4.48 MIL/uL (ref 3.87–5.11)
RDW: 20.1 % — ABNORMAL HIGH (ref 11.5–15.5)
WBC: 7.4 10*3/uL (ref 4.0–10.5)

## 2014-09-29 LAB — PROTIME-INR
INR: 1.27 (ref 0.00–1.49)
Prothrombin Time: 16 seconds — ABNORMAL HIGH (ref 11.6–15.2)

## 2014-09-29 NOTE — ED Notes (Signed)
Wheeled pt to room, she refused to get out of the wheelchair and onto the bed.

## 2014-09-29 NOTE — ED Notes (Signed)
Allen, MD at bedside

## 2014-09-29 NOTE — ED Notes (Signed)
Pt here for confusion, doesn't recognize people, places and other things. Pt had recent bariatric surgery. This has been going on for several weeks since the surgery

## 2014-09-29 NOTE — ED Provider Notes (Signed)
CSN: [id number]     Arrival date & time 09/29/14  1948 History   First MD Initiated Contact with Patient 09/29/14 2211     Chief Complaint  Patient presents with  . Altered Mental Status     (Consider location/radiation/quality/duration/timing/severity/associated sxs/prior Treatment) HPI Comments: Patient here complaining of confusion that has been intermittent times several weeks. She has had trouble with memory. Symptoms wax and wane and nothing makes them better worse. Her husband did decrease her dose of oxycodone at this time over the past 3 days and this seemed to improve things at times. No reported fever, cough, urinary symptoms. No headache or visual changes. No chest or abdominal pain. She has had periods of diffuse weakness. Husband states that times she doesn't recognize him. No prior history of same.  Patient is a 69 y.o. female presenting with altered mental status. The history is provided by the patient, the spouse and a relative.  Altered Mental Status   Past Medical History  Diagnosis Date  . Asthma   . Arthritis   . Hypertension   . Hypercholesteremia   . Unstable angina 09/09/2011  . HTN (hypertension) 09/09/2011  . Hyperlipemia 09/09/2011  . GERD (gastroesophageal reflux disease) 09/09/2011  . Anxiety   . Diverticulosis   . Antral polyp     benign  . IBS (irritable bowel syndrome)   . Obesity   . Internal hemorrhoid   . Vocal cord dysfunction 2013    swelling  . Shortness of breath   . Tachycardia   . Paroxysmal atrial fibrillation   . PONV (postoperative nausea and vomiting)     with big surgeries, none with endos etc, with colonoscopy- 5-6 years ago could not swallow   . Urinary incontinence   . Sleep apnea     borderline per patient no cpap    Past Surgical History  Procedure Laterality Date  . Total knee arthroplasty Bilateral     left 2002, right 2012  . Cholecystectomy    . Abdominal hysterectomy  1988  . Tumor excision  1964    rt. leg fatty  tumor  . Shoulder surgery Right 1987  . Wrist surgery Left 1997, 2009   . Elbow surgery Left     removed bone chip  . Foot osteotomy w/ plantar fascia release Left   . Total ankle replacement Right     x 3  . Hip ilioban release Right 1997  . Total ankle replacement Right 2012  . Ankle fusion Right     x 2  . Thumb replacement Bilateral     one 2009 and one 2014  . Back surgery  [phone]    3 ruptured discs, lower back  . Nasal sinus surgery  1997  . Esophagogastroduodenoscopy [name] 03/06/2013    Procedure: ESOPHAGOGASTRODUODENOSCOPY (EGD);  Surgeon: Inda Castle, MD;  Location: [facility];  Service: Endoscopy;  Laterality: [name];  . Bravo ph study [name] 03/06/2013    Procedure: BRAVO Rossiter STUDY;  Surgeon: Inda Castle, MD;  Location: [facility];  Service: Endoscopy;  Laterality: [name];  . Tfc wrist Left 1997  . Left heart catheterization with coronary angiogram Bilateral 09/10/2011    Procedure: LEFT HEART CATHETERIZATION WITH CORONARY ANGIOGRAM;  Surgeon: Lorretta Harp, MD;  [facility];  Service: Cardiovascular;  Laterality: Bilateral;  . Laparoscopic gastric sleeve resection with hiatal hernia repair  08/20/2014    Procedure: LAPAROSCOPIC GASTRIC SLEEVE RESECTION WITH HIATAL HERNIA  REPAIR AND  UPPER ENDOSCOPY;  Surgeon: Johnathan Hausen, MD;  Location: [facility] ORS;  Service: General;;  . Upper gi endoscopy  08/20/2014    Procedure: UPPER GI ENDOSCOPY;  Surgeon: Johnathan Hausen, MD;  Location: [facility] ORS;  Service: General;;  . Cardiac catheterization  2013    NORMAL  . Transthoracic echocardiogram  09/09/2011    MILD CONCENTRIC LVH. TRACE MITRAL REGURG. TRACE TR. MILD AORTIC REGURG.   Family History  Problem Relation Age of Onset  . Hypertension Mother   . Diabetes Mother   . Arthritis Mother   . Esophageal cancer Mother   . Esophageal cancer Father 57  . Coronary artery disease Neg Hx   . Colon cancer Neg Hx   . Liver disease Neg Hx   . Kidney disease Neg Hx   .  Other Sister     kidney mass removed  . Hypertension Sister   . Heart disease Maternal Grandmother   . Stroke Paternal Grandmother    History  Substance Use Topics  . Smoking status: Never Smoker   . Smokeless tobacco: Never Used  . Alcohol Use: No   OB History    No data available     Review of Systems  All other systems reviewed and are negative.     Allergies  Morphine and related  Home Medications   Prior to Admission medications   Medication Sig Start Date End Date Taking? Authorizing Provider  acetaminophen (TYLENOL) 500 MG tablet Take 1,000 mg by mouth 3 (three) times daily as needed for moderate pain or headache.     Historical Provider, MD  albuterol (PROVENTIL HFA;VENTOLIN HFA) 108 (90 BASE) MCG/ACT inhaler Inhale 1-2 puffs into the lungs every 6 (six) hours as needed for wheezing or shortness of breath.    Historical Provider, MD  apixaban (ELIQUIS) 5 MG TABS tablet Take 1 tablet (5 mg total) by mouth 2 (two) times daily. 12/11/13   Lorretta Harp, MD  budesonide (PULMICORT) 0.5 MG/2ML nebulizer solution Take 2 mLs (0.5 mg total) by nebulization 2 (two) times daily. Patient taking differently: Take 0.5 mg by nebulization 2 (two) times daily as needed (shorntess of breathe.).  07/03/13   Chesley Mires, MD  clorazepate (TRANXENE) 7.5 MG tablet Take 7.5 mg by mouth daily as needed for anxiety.     Historical Provider, MD  cyclobenzaprine (FLEXERIL) 10 MG tablet Take 10 mg by mouth 3 (three) times daily.    Historical Provider, MD  diltiazem (CARDIZEM CD) 180 MG 24 hr capsule Take 1 capsule (180 mg total) by mouth daily. 12/11/13   Lorretta Harp, MD  DULoxetine (CYMBALTA) 60 MG capsule Take 1 capsule by mouth daily with supper. 01/07/14   Historical Provider, MD  levalbuterol Penne Lash) 0.63 MG/3ML nebulizer solution Take 0.63 mg by nebulization every 4 (four) hours as needed for wheezing or shortness of breath.    Historical Provider, MD  losartan (COZAAR) 50 MG tablet  Take 50 mg by mouth 2 (two) times daily.     Historical Provider, MD  metoCLOPramide (REGLAN) 10 MG tablet Take 10 mg by mouth 2 (two) times daily.  08/27/13   Amy S Esterwood, PA-C  metoCLOPramide (REGLAN) 10 MG tablet TAKE ONE TABLET BY MOUTH 30 MINUTES  BEFORE MEAL(S) AND ONE AT BEDTIME 09/03/14   Inda Castle, MD  oxyCODONE (ROXICODONE) 15 MG immediate release tablet Take 15 mg by mouth every 4 (four) hours as needed for pain. Patient takes 6 per day    Historical Provider, MD  oxyCODONE-acetaminophen (  PERCOCET) 10-325 MG per tablet Take 1 tablet by mouth every 4 (four) hours as needed for pain.    Historical Provider, MD  pantoprazole (PROTONIX) 40 MG tablet Take 40 mg by mouth 2 (two) times daily.  04/05/13   Inda Castle, MD  Probiotic Product (RESTORA) CAPS Take 1 capsule by mouth daily. Patient taking differently: Take 1 capsule by mouth daily with supper.  10/08/13   Amy S Esterwood, PA-C  simvastatin (ZOCOR) 20 MG tablet Take 20 mg by mouth every evening.    Historical Provider, MD   BP 131/83 mmHg  Pulse 108  Temp(Src) 98.5 F (36.9 C) (Oral)  Resp 18  Ht 5\' 4"  (1.626 m)  Wt 242 lb (109.77 kg)  BMI 41.52 kg/m2  SpO2 97% Physical Exam  Constitutional: She is oriented to person, place, and time. She appears well-developed and well-nourished.  Non-toxic appearance. No distress.  HENT:  Head: Normocephalic and atraumatic.  Eyes: Conjunctivae, EOM and lids are normal. Pupils are equal, round, and reactive to light.  Neck: Normal range of motion. Neck supple. No tracheal deviation present. No thyroid mass present.  Cardiovascular: Normal rate, regular rhythm and normal heart sounds.  Exam reveals no gallop.   No murmur heard. Pulmonary/Chest: Effort normal and breath sounds normal. No stridor. No respiratory distress. She has no decreased breath sounds. She has no wheezes. She has no rhonchi. She has no rales.  Abdominal: Soft. Normal appearance and bowel sounds are normal. She  exhibits no distension. There is no tenderness. There is no rebound and no CVA tenderness.  Musculoskeletal: Normal range of motion. She exhibits no edema or tenderness.  Neurological: She is alert and oriented to person, place, and time. She has normal strength. No cranial nerve deficit or sensory deficit. GCS eye subscore is 4. GCS verbal subscore is 5. GCS motor subscore is 6.  Skin: Skin is warm and dry. No abrasion and no rash noted.  Psychiatric: Her affect is blunt. Her speech is delayed. She is slowed. Thought content is not paranoid and not delusional.  Nursing note and vitals reviewed.   ED Course  Procedures (including critical care time) Labs Review Labs Reviewed  COMPREHENSIVE METABOLIC PANEL - Abnormal; Notable for the following:    Glucose, Bld 130 (*)    BUN 21 (*)    Total Protein 6.3 (*)    All other components within normal limits  CBC - Abnormal; Notable for the following:    Hemoglobin 10.8 (*)    HCT 35.4 (*)    MCH 24.1 (*)    RDW 20.1 (*)    All other components within normal limits  PROTIME-INR - Abnormal; Notable for the following:    Prothrombin Time 16.0 (*)    All other components within normal limits  URINALYSIS, ROUTINE W REFLEX MICROSCOPIC (NOT AT [facility])  CBG MONITORING, ED    Imaging Review Dg Chest 2 View  09/29/2014   CLINICAL DATA:  Confusion. Recent bariatric surgery. Confusion for several weeks since the surgery.  EXAM: CHEST  2 VIEW  COMPARISON:  07/03/2014  FINDINGS: Normal heart size and pulmonary vascularity. No focal airspace disease or consolidation in the lungs. No blunting of costophrenic angles. No pneumothorax. Mediastinal contours appear intact. Degenerative changes in the spine and shoulders. Probable degenerative cysts in the right humeral head. Calcification of the aorta.  IMPRESSION: No active cardiopulmonary disease.   Electronically Signed   By: Lucienne Capers M.D.   On: 09/29/2014 21:28  Ct Head Wo Contrast  09/29/2014    CLINICAL DATA:  Confusion.  No longer recognizes people or places.  EXAM: CT HEAD WITHOUT CONTRAST  TECHNIQUE: Contiguous axial images were obtained from the base of the skull through the vertex without intravenous contrast.  COMPARISON:  None.  FINDINGS: The brain shows generalized atrophy, somewhat advanced for age. No sign of old or acute focal infarction, mass lesion, hemorrhage, hydrocephalus or extra-axial collection. The calvarium is unremarkable. Sinuses, middle ears and mastoids are clear. There is atherosclerotic calcification of the major vessels at the base of the brain.  IMPRESSION: Atrophy, advanced for age.  No focal or acute finding.   Electronically Signed   By: Nelson Chimes M.D.   On: 09/29/2014 20:51     EKG Interpretation None      MDM   Final diagnoses:  None    Discussed at length with patient about her current condition. CT of the head results reviewed. Patient has deferred urinalysis at this time. She has had symptoms for several weeks. I suspect that her current symptoms are from polypharmacy.. Symptoms have somewhat improved since decreasing her dose of opiates. She has a scheduled appointment in 4 days with her physician and she was instructed to keep this. Return precautions given    Lacretia Leigh, MD 09/29/14 2259

## 2014-09-29 NOTE — Discharge Instructions (Signed)
Keep your appointment with your physician for Thursday. Return here for fever, persistent confusion, or any other problems

## 2014-10-01 DIAGNOSIS — N390 Urinary tract infection, site not specified: Secondary | ICD-10-CM | POA: Diagnosis not present

## 2014-10-01 DIAGNOSIS — R410 Disorientation, unspecified: Secondary | ICD-10-CM | POA: Diagnosis not present

## 2014-10-03 DIAGNOSIS — Z6841 Body Mass Index (BMI) 40.0 and over, adult: Secondary | ICD-10-CM | POA: Diagnosis not present

## 2014-10-03 DIAGNOSIS — G8929 Other chronic pain: Secondary | ICD-10-CM | POA: Diagnosis not present

## 2014-10-03 DIAGNOSIS — R7301 Impaired fasting glucose: Secondary | ICD-10-CM | POA: Diagnosis not present

## 2014-10-03 DIAGNOSIS — Z9884 Bariatric surgery status: Secondary | ICD-10-CM | POA: Diagnosis not present

## 2014-10-03 DIAGNOSIS — R410 Disorientation, unspecified: Secondary | ICD-10-CM | POA: Diagnosis not present

## 2014-10-03 DIAGNOSIS — Z79899 Other long term (current) drug therapy: Secondary | ICD-10-CM | POA: Diagnosis not present

## 2014-10-03 DIAGNOSIS — M25552 Pain in left hip: Secondary | ICD-10-CM | POA: Diagnosis not present

## 2014-10-04 NOTE — Patient Outreach (Signed)
[place] [facility]) Care Management  10/04/2014  [name] 1945-12-26 [id number]   Referral from MD, assigned to Theresa Plowman, RN for patient outreach.  [name] L. Elbert Ew[facility] Care Management Assistant [phone]

## 2014-10-05 ENCOUNTER — Other Ambulatory Visit: Payer: Self-pay | Admitting: Gastroenterology

## 2014-10-07 DIAGNOSIS — M25552 Pain in left hip: Secondary | ICD-10-CM | POA: Diagnosis not present

## 2014-10-07 DIAGNOSIS — M25551 Pain in right hip: Secondary | ICD-10-CM | POA: Diagnosis not present

## 2014-10-09 ENCOUNTER — Other Ambulatory Visit: Payer: Self-pay | Admitting: Cardiovascular Disease

## 2014-10-09 MED ORDER — APIXABAN 5 MG PO TABS: 5.0000 mg | ORAL_TABLET | Freq: Two times a day (BID) | ORAL | Status: DC

## 2014-10-09 MED ORDER — DILTIAZEM HCL ER COATED BEADS 180 MG PO CP24: 180.0000 mg | ORAL_CAPSULE | Freq: Every day | ORAL | Status: DC

## 2014-10-09 NOTE — Telephone Encounter (Signed)
°  1. Which medications need to be refilled? Eliquis and Diltiazem-please call today  2. Which pharmacy is medication to be sent to [place]  3. Do they need a 30 day or 90 day supply? 30 and refills 4. Would they like a call back once the medication has been sent to the pharmacy? no

## 2014-10-09 NOTE — Telephone Encounter (Signed)
Rx(s) sent to pharmacy electronically.  

## 2014-10-10 DIAGNOSIS — M25551 Pain in right hip: Secondary | ICD-10-CM | POA: Diagnosis not present

## 2014-10-10 DIAGNOSIS — M25552 Pain in left hip: Secondary | ICD-10-CM | POA: Diagnosis not present

## 2014-10-11 ENCOUNTER — Encounter: Payer: Self-pay | Admitting: Cardiovascular Disease

## 2014-10-11 ENCOUNTER — Other Ambulatory Visit: Payer: Self-pay

## 2014-10-11 NOTE — Patient Outreach (Signed)
[facility]) Care Management  10/11/2014  [name] 1945-05-31 [id number]   Telephone call to patient regarding high risk list referral.  HIPAA confirmed with patient. Discussed and offered [facility] care management services to patient.  Patient refused services at this time.   PLAN: RNCM will refer patient to [facility] to close due to refusal of services.  RNCM will notify patients primary MD.  Quinn Plowman RN,BSN,CCM [place] Coordinator [phone]

## 2014-10-11 NOTE — Patient Outreach (Signed)
[facility] [facility]) Care Management  10/11/2014  ARONDA BURFORD [birth year] [id number]   Notification received from Quinn Plowman, RN to close case due to refusal of services.  Rowland Ericsson L. Toleen Lachapelle, [place] Care Management Assistant

## 2014-10-13 ENCOUNTER — Emergency Department (HOSPITAL_COMMUNITY): Payer: Medicare Other

## 2014-10-13 ENCOUNTER — Observation Stay (HOSPITAL_COMMUNITY)
Admission: EM | Admit: 2014-10-13 | Discharge: 2014-10-15 | Disposition: A | Discharge status: Home / Self Care | Payer: Medicare Other | Attending: Internal Medicine | Admitting: Internal Medicine

## 2014-10-13 ENCOUNTER — Encounter (HOSPITAL_COMMUNITY): Payer: Self-pay | Admitting: Emergency Medicine

## 2014-10-13 ENCOUNTER — Observation Stay (HOSPITAL_COMMUNITY): Payer: Medicare Other

## 2014-10-13 DIAGNOSIS — R1084 Generalized abdominal pain: Secondary | ICD-10-CM | POA: Diagnosis not present

## 2014-10-13 DIAGNOSIS — F419 Anxiety disorder, unspecified: Secondary | ICD-10-CM | POA: Diagnosis not present

## 2014-10-13 DIAGNOSIS — F329 Major depressive disorder, single episode, unspecified: Secondary | ICD-10-CM | POA: Diagnosis not present

## 2014-10-13 DIAGNOSIS — K760 Fatty (change of) liver, not elsewhere classified: Secondary | ICD-10-CM | POA: Diagnosis not present

## 2014-10-13 DIAGNOSIS — Z96653 Presence of artificial knee joint, bilateral: Secondary | ICD-10-CM | POA: Diagnosis not present

## 2014-10-13 DIAGNOSIS — I10 Essential (primary) hypertension: Secondary | ICD-10-CM | POA: Insufficient documentation

## 2014-10-13 DIAGNOSIS — M1610 Unilateral primary osteoarthritis, unspecified hip: Secondary | ICD-10-CM | POA: Diagnosis not present

## 2014-10-13 DIAGNOSIS — I482 Chronic atrial fibrillation: Secondary | ICD-10-CM | POA: Diagnosis not present

## 2014-10-13 DIAGNOSIS — E785 Hyperlipidemia, unspecified: Secondary | ICD-10-CM | POA: Insufficient documentation

## 2014-10-13 DIAGNOSIS — Z6841 Body Mass Index (BMI) 40.0 and over, adult: Secondary | ICD-10-CM | POA: Diagnosis not present

## 2014-10-13 DIAGNOSIS — R4182 Altered mental status, unspecified: Secondary | ICD-10-CM | POA: Diagnosis not present

## 2014-10-13 DIAGNOSIS — E860 Dehydration: Secondary | ICD-10-CM | POA: Diagnosis present

## 2014-10-13 DIAGNOSIS — K5900 Constipation, unspecified: Secondary | ICD-10-CM | POA: Insufficient documentation

## 2014-10-13 DIAGNOSIS — T40601A Poisoning by unspecified narcotics, accidental (unintentional), initial encounter: Principal | ICD-10-CM | POA: Insufficient documentation

## 2014-10-13 DIAGNOSIS — M160 Bilateral primary osteoarthritis of hip: Secondary | ICD-10-CM | POA: Diagnosis not present

## 2014-10-13 DIAGNOSIS — K219 Gastro-esophageal reflux disease without esophagitis: Secondary | ICD-10-CM | POA: Diagnosis not present

## 2014-10-13 DIAGNOSIS — E669 Obesity, unspecified: Secondary | ICD-10-CM | POA: Diagnosis not present

## 2014-10-13 DIAGNOSIS — K5730 Diverticulosis of large intestine without perforation or abscess without bleeding: Secondary | ICD-10-CM | POA: Diagnosis not present

## 2014-10-13 DIAGNOSIS — F4489 Other dissociative and conversion disorders: Secondary | ICD-10-CM

## 2014-10-13 DIAGNOSIS — F05 Delirium due to known physiological condition: Secondary | ICD-10-CM | POA: Diagnosis present

## 2014-10-13 DIAGNOSIS — G8929 Other chronic pain: Secondary | ICD-10-CM | POA: Insufficient documentation

## 2014-10-13 DIAGNOSIS — G311 Senile degeneration of brain, not elsewhere classified: Secondary | ICD-10-CM | POA: Diagnosis not present

## 2014-10-13 DIAGNOSIS — R410 Disorientation, unspecified: Secondary | ICD-10-CM | POA: Diagnosis not present

## 2014-10-13 DIAGNOSIS — M169 Osteoarthritis of hip, unspecified: Secondary | ICD-10-CM | POA: Diagnosis present

## 2014-10-13 DIAGNOSIS — R109 Unspecified abdominal pain: Secondary | ICD-10-CM

## 2014-10-13 LAB — CBC WITH DIFFERENTIAL/PLATELET
Basophils Absolute: 0 10*3/uL (ref 0.0–0.1)
Basophils Relative: 0 % (ref 0–1)
EOS PCT: 0 % (ref 0–5)
Eosinophils Absolute: 0 10*3/uL (ref 0.0–0.7)
HCT: 36.6 % (ref 36.0–46.0)
Hemoglobin: 11.5 g/dL — ABNORMAL LOW (ref 12.0–15.0)
LYMPHS PCT: 23 % (ref 12–46)
Lymphs Abs: 1.6 10*3/uL (ref 0.7–4.0)
MCH: 24.3 pg — ABNORMAL LOW (ref 26.0–34.0)
MCHC: 31.4 g/dL (ref 30.0–36.0)
MCV: 77.2 fL — AB (ref 78.0–100.0)
Monocytes Absolute: 0.5 10*3/uL (ref 0.1–1.0)
Monocytes Relative: 7 % (ref 3–12)
NEUTROS ABS: 4.8 10*3/uL (ref 1.7–7.7)
Neutrophils Relative %: 70 % (ref 43–77)
Platelets: 199 10*3/uL (ref 150–400)
RBC: 4.74 MIL/uL (ref 3.87–5.11)
RDW: 19.8 % — AB (ref 11.5–15.5)
WBC: 6.9 10*3/uL (ref 4.0–10.5)

## 2014-10-13 LAB — URINE MICROSCOPIC-ADD ON

## 2014-10-13 LAB — COMPREHENSIVE METABOLIC PANEL
ALBUMIN: 3.6 g/dL (ref 3.5–5.0)
ALK PHOS: 89 U/L (ref 38–126)
ALT: 18 U/L (ref 14–54)
ANION GAP: 14 (ref 5–15)
AST: 18 U/L (ref 15–41)
BUN: 14 mg/dL (ref 6–20)
CHLORIDE: 101 mmol/L (ref 101–111)
CO2: 20 mmol/L — ABNORMAL LOW (ref 22–32)
CREATININE: 0.69 mg/dL (ref 0.44–1.00)
Calcium: 9.4 mg/dL (ref 8.9–10.3)
GFR calc Af Amer: >60 mL/min (ref >60)
GFR calc non Af Amer: >60 mL/min (ref >60)
Glucose, Bld: 125 mg/dL — ABNORMAL HIGH (ref 65–99)
Potassium: 3.2 mmol/L — ABNORMAL LOW (ref 3.5–5.1)
Sodium: 135 mmol/L (ref 135–145)
TOTAL PROTEIN: 6.4 g/dL — AB (ref 6.5–8.1)
Total Bilirubin: 0.4 mg/dL (ref 0.3–1.2)

## 2014-10-13 LAB — CBG MONITORING, ED: Glucose-Capillary: 128 mg/dL — ABNORMAL HIGH (ref 65–99)

## 2014-10-13 LAB — URINALYSIS, ROUTINE W REFLEX MICROSCOPIC
GLUCOSE, UA: NEGATIVE mg/dL
HGB URINE DIPSTICK: NEGATIVE
Ketones, ur: 40 mg/dL — AB
Nitrite: NEGATIVE
Protein, ur: NEGATIVE mg/dL
Specific Gravity, Urine: 1.023 (ref 1.005–1.030)
Urobilinogen, UA: 0.2 mg/dL (ref 0.0–1.0)
pH: 6 (ref 5.0–8.0)

## 2014-10-13 LAB — MAGNESIUM: Magnesium: 1.9 mg/dL (ref 1.7–2.4)

## 2014-10-13 LAB — I-STAT CG4 LACTIC ACID, ED
Lactic Acid, Venous: 1.24 mmol/L (ref 0.5–2.0)
Lactic Acid, Venous: 1.77 mmol/L (ref 0.5–2.0)

## 2014-10-13 LAB — TROPONIN I
Troponin I: <0.03 ng/mL (ref <0.031)
Troponin I: <0.03 ng/mL (ref <0.031)

## 2014-10-13 LAB — TSH: TSH: 0.771 u[IU]/mL (ref 0.350–4.500)

## 2014-10-13 MED ORDER — FENTANYL CITRATE (PF) 100 MCG/2ML IJ SOLN
50.0000 ug | Freq: Once | INTRAMUSCULAR | Status: AC
  Administered 2014-10-13: 50 ug via INTRAVENOUS
  Filled 2014-10-13: qty 2

## 2014-10-13 MED ORDER — LOSARTAN POTASSIUM 50 MG PO TABS
50.0000 mg | ORAL_TABLET | Freq: Every day | ORAL | Status: DC
  Administered 2014-10-13 – 2014-10-15 (×3): 50 mg via ORAL
  Filled 2014-10-13 (×3): qty 1

## 2014-10-13 MED ORDER — POTASSIUM CHLORIDE IN NACL 40-0.9 MEQ/L-% IV SOLN
INTRAVENOUS | Status: DC
  Administered 2014-10-13 – 2014-10-14 (×2): 75 mL/h via INTRAVENOUS
  Filled 2014-10-13 (×3): qty 1000

## 2014-10-13 MED ORDER — SODIUM CHLORIDE 0.9 % IJ SOLN
3.0000 mL | Freq: Two times a day (BID) | INTRAMUSCULAR | Status: DC
  Administered 2014-10-13: 10 mL via INTRAVENOUS
  Administered 2014-10-14 – 2014-10-15 (×2): 3 mL via INTRAVENOUS

## 2014-10-13 MED ORDER — ACETAMINOPHEN 325 MG PO TABS
650.0000 mg | ORAL_TABLET | Freq: Four times a day (QID) | ORAL | Status: DC | PRN
  Administered 2014-10-14 – 2014-10-15 (×2): 650 mg via ORAL
  Filled 2014-10-13 (×2): qty 2

## 2014-10-13 MED ORDER — ACETAMINOPHEN 500 MG PO TABS: 1000.0000 mg | ORAL_TABLET | Freq: Three times a day (TID) | ORAL | Status: DC | PRN

## 2014-10-13 MED ORDER — APIXABAN 5 MG PO TABS
5.0000 mg | ORAL_TABLET | Freq: Two times a day (BID) | ORAL | Status: DC
  Administered 2014-10-13 – 2014-10-15 (×4): 5 mg via ORAL
  Filled 2014-10-13 (×5): qty 1

## 2014-10-13 MED ORDER — ONDANSETRON HCL 4 MG/2ML IJ SOLN
4.0000 mg | Freq: Four times a day (QID) | INTRAMUSCULAR | Status: DC | PRN
  Administered 2014-10-14: 4 mg via INTRAVENOUS
  Filled 2014-10-13: qty 2

## 2014-10-13 MED ORDER — BUDESONIDE 0.5 MG/2ML IN SUSP
0.5000 mg | Freq: Two times a day (BID) | RESPIRATORY_TRACT | Status: DC
Start: 2014-10-13 — End: 2014-10-13

## 2014-10-13 MED ORDER — POTASSIUM CHLORIDE CRYS ER 20 MEQ PO TBCR
40.0000 meq | EXTENDED_RELEASE_TABLET | Freq: Once | ORAL | Status: AC
  Administered 2014-10-13: 40 meq via ORAL
  Filled 2014-10-13: qty 2

## 2014-10-13 MED ORDER — SODIUM CHLORIDE 0.9 % IV BOLUS (SEPSIS)
1000.0000 mL | Freq: Once | INTRAVENOUS | Status: AC
  Administered 2014-10-13: 1000 mL via INTRAVENOUS

## 2014-10-13 MED ORDER — DIPHENHYDRAMINE HCL 50 MG/ML IJ SOLN
12.5000 mg | Freq: Once | INTRAMUSCULAR | Status: AC
  Administered 2014-10-13: 12.5 mg via INTRAVENOUS
  Filled 2014-10-13: qty 1

## 2014-10-13 MED ORDER — IOHEXOL 300 MG/ML  SOLN
100.0000 mL | Freq: Once | INTRAMUSCULAR | Status: AC | PRN
  Administered 2014-10-13: 100 mL via INTRAVENOUS

## 2014-10-13 MED ORDER — ALBUTEROL SULFATE (2.5 MG/3ML) 0.083% IN NEBU: 2.5000 mg | INHALATION_SOLUTION | Freq: Four times a day (QID) | RESPIRATORY_TRACT | Status: DC | PRN

## 2014-10-13 MED ORDER — OXYCODONE HCL 5 MG PO TABS
5.0000 mg | ORAL_TABLET | Freq: Four times a day (QID) | ORAL | Status: DC | PRN
  Administered 2014-10-13 (×2): 10 mg via ORAL
  Administered 2014-10-14: 5 mg via ORAL
  Filled 2014-10-13 (×3): qty 2

## 2014-10-13 MED ORDER — SODIUM CHLORIDE 0.9 % IV SOLN: INTRAVENOUS | Status: AC

## 2014-10-13 MED ORDER — DILTIAZEM HCL ER COATED BEADS 180 MG PO CP24
180.0000 mg | ORAL_CAPSULE | Freq: Every day | ORAL | Status: DC
  Administered 2014-10-13 – 2014-10-15 (×3): 180 mg via ORAL
  Filled 2014-10-13 (×3): qty 1

## 2014-10-13 MED ORDER — PANTOPRAZOLE SODIUM 40 MG PO TBEC
40.0000 mg | DELAYED_RELEASE_TABLET | Freq: Two times a day (BID) | ORAL | Status: DC
  Administered 2014-10-13 – 2014-10-15 (×4): 40 mg via ORAL
  Filled 2014-10-13 (×3): qty 1

## 2014-10-13 MED ORDER — OXYCODONE HCL 5 MG PO TABS: 5.0000 mg | ORAL_TABLET | Freq: Once | ORAL | Status: DC

## 2014-10-13 MED ORDER — DULOXETINE HCL 60 MG PO CPEP
60.0000 mg | ORAL_CAPSULE | Freq: Every day | ORAL | Status: DC
  Administered 2014-10-13 – 2014-10-14 (×2): 60 mg via ORAL
  Filled 2014-10-13 (×3): qty 1

## 2014-10-13 MED ORDER — ONDANSETRON HCL 4 MG PO TABS
4.0000 mg | ORAL_TABLET | Freq: Four times a day (QID) | ORAL | Status: DC | PRN
  Administered 2014-10-15: 4 mg via ORAL
  Filled 2014-10-13: qty 1

## 2014-10-13 MED ORDER — ACETAMINOPHEN 650 MG RE SUPP: 650.0000 mg | Freq: Four times a day (QID) | RECTAL | Status: DC | PRN

## 2014-10-13 NOTE — ED Notes (Signed)
  Pt transported to ct 

## 2014-10-13 NOTE — H&P (Signed)
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 Patient has a history of atrial fibrillation, followed by Dr. Gwenlyn Found, she held her eliquis for 7 days prior to surgery. Patient had some postop acute Delirium , after surgery which was attributed to lorazepam. Patient was also discharged home on large doses of oxycodone 15 mg every 4 hours. Patient was found to have intermittent confusion which was waxing and waning, worse in the morning. Husband states that the patient recently had a sleep study which was negative. She was recently treated with ciprofloxacin by her PCP. She completed ciprofloxacin on the 19th. Patient has had no fever, urinary symptoms, chest pain or shortness of breath. She simply had diffuse generalized weakness , worsening confusion when she wakes up in the morning.  In the ED the patient was found to be tachycardic but otherwise stable. She has been compliant with her medications. She has cut back  her oxycodone to 7.5 mg every 4 hours. Workup in the ER including  CT scan of the abdomen and pelvis, UA did not show any  postop complications of small bowel obstruction. UA unimpressive. Patient was admitted for dehydration and possible  nursing home placement.     Review of Systems: negative for the following  Constitutional: Denies fever, chills, diaphoresis, appetite change and fatigue.  HEENT: Denies photophobia, eye pain, redness, hearing loss, ear pain, congestion, sore throat, rhinorrhea, sneezing, mouth sores, trouble swallowing, neck pain, neck stiffness and tinnitus.  Respiratory: Denies SOB, DOE, cough, chest  tightness, and wheezing.  Cardiovascular: Denies chest pain, palpitations and leg swelling.  Gastrointestinal: Positive for abdominal pain and constipation.   Genitourinary: Denies dysuria, urgency, frequency, hematuria, flank pain and difficulty urinating.  Musculoskeletal: Denies myalgias, back pain, joint swelling, arthralgias and gait problem.  Skin: Denies pallor, rash and wound.  Neurological: Denies dizziness, seizures, syncope, weakness, light-headedness, numbness and headaches.  Hematological: Denies adenopathy. Easy bruising, personal or family bleeding history  Psychiatric/Behavioral: Positive for confusion.      Past Medical History  Diagnosis Date  . Asthma   . Arthritis   . Hypertension   . Hypercholesteremia   . Unstable angina 09/09/2011  . HTN (hypertension) 09/09/2011  . Hyperlipemia 09/09/2011  . GERD (gastroesophageal reflux disease) 09/09/2011  . Anxiety   . Diverticulosis   . Antral polyp     benign  . IBS (irritable bowel syndrome)   . Obesity   . Internal hemorrhoid   . Vocal cord dysfunction 2013    swelling  . Shortness of breath   . Tachycardia   . Paroxysmal atrial fibrillation   . PONV (postoperative nausea and vomiting)     with big surgeries, none with endos etc, with colonoscopy- 5-6 years ago could not swallow   . Urinary incontinence   . Sleep apnea     borderline per patient no cpap   . Confusion 10/13/2014     Past Surgical History  Procedure Laterality Date  . Total knee arthroplasty Bilateral     left 2002, right 2012  . Cholecystectomy    . Abdominal hysterectomy  1988  . Tumor excision  1964    rt. leg fatty tumor  .  Shoulder surgery Right 1987  . Wrist surgery Left 1997, 2009   . Elbow surgery Left     removed bone chip  . Foot osteotomy w/ plantar fascia release Left   . Total ankle replacement Right     x 3  . Hip ilioban release Right 1997  . Total ankle replacement Right 2012  . Ankle fusion Right     x 2  . Thumb  replacement Bilateral     one 2009 and one 2014  . Back surgery  [phone]    3 ruptured discs, lower back  . Nasal sinus surgery  1997  . Esophagogastroduodenoscopy [name] 03/06/2013    Procedure: ESOPHAGOGASTRODUODENOSCOPY (EGD);  Surgeon: Inda Castle, MD;  Location: [facility];  Service: Endoscopy;  Laterality: [name];  . Bravo ph study [name] 03/06/2013    Procedure: BRAVO [place] STUDY;  Surgeon: Inda Castle, MD;  Location: [facility]OSCOPY;  Service: Endoscopy;  Laterality: [name];  . Tfc wrist Left 1997  . Left heart catheterization with coronary angiogram Bilateral 09/10/2011    Procedure: LEFT HEART CATHETERIZATION WITH CORONARY ANGIOGRAM;  Surgeon: Lorretta Harp, MD;  Location: [facility] CATH LAB;  Service: Cardiovascular;  Laterality: Bilateral;  . Laparoscopic gastric sleeve resection with hiatal hernia repair  08/20/2014    Procedure: LAPAROSCOPIC GASTRIC SLEEVE RESECTION WITH HIATAL HERNIA  REPAIR AND  UPPER ENDOSCOPY;  Surgeon: Johnathan Hausen, MD;  Location: [facility] ORS;  Service: General;;  . Upper gi endoscopy  08/20/2014    Procedure: UPPER GI ENDOSCOPY;  Surgeon: Johnathan Hausen, MD;  Location: [facility] ORS;  Service: General;;  . Cardiac catheterization  2013    NORMAL  . Transthoracic echocardiogram  09/09/2011    MILD CONCENTRIC LVH. TRACE MITRAL REGURG. TRACE TR. MILD AORTIC REGURG.      Social History:  reports that she has never smoked. She has never used smokeless tobacco. She reports that she does not drink alcohol or use illicit drugs.    Allergies  Allergen Reactions  . Ativan [Lorazepam] Other (See Comments)    Agitation, aggressive actions, significant mental changes  . Fentanyl Itching  . Morphine And Related Itching and Rash    Family History  Problem Relation Age of Onset  . Hypertension Mother   . Diabetes Mother   . Arthritis Mother   . Esophageal cancer Mother   . Esophageal cancer Father 73  . Coronary artery disease Neg Hx   . Colon cancer Neg Hx   . Liver  disease Neg Hx   . Kidney disease Neg Hx   . Other Sister     kidney mass removed  . Hypertension Sister   . Heart disease Maternal Grandmother   . Stroke Paternal Grandmother        Prior to Admission medications   Medication Sig Start Date End Date Taking? Authorizing Provider  acetaminophen (TYLENOL) 500 MG tablet Take 1,000 mg by mouth 3 (three) times daily as needed for moderate pain or headache.    Yes Historical Provider, MD  albuterol (PROVENTIL HFA;VENTOLIN HFA) 108 (90 BASE) MCG/ACT inhaler Inhale 1-2 puffs into the lungs every 6 (six) hours as needed for wheezing or shortness of breath.   Yes Historical Provider, MD  apixaban (ELIQUIS) 5 MG TABS tablet Take 1 tablet (5 mg total) by mouth 2 (two) times daily. 10/09/14  Yes Lorretta Harp, MD  budesonide (PULMICORT) 0.5 MG/2ML nebulizer solution Take 2 mLs (0.5 mg total) by nebulization 2 (two) times daily. Patient taking differently:  Take 0.5 mg by nebulization 2 (two) times daily as needed (shorntess of breathe.).  07/03/13  Yes Chesley Mires, MD  diclofenac (VOLTAREN) 75 MG EC tablet Take 75 mg by mouth 2 (two) times daily.   Yes Historical Provider, MD  diltiazem (CARDIZEM CD) 180 MG 24 hr capsule Take 1 capsule (180 mg total) by mouth daily. 10/09/14  Yes Lorretta Harp, MD  DULoxetine (CYMBALTA) 60 MG capsule Take 1 capsule by mouth daily with supper. 01/07/14  Yes Historical Provider, MD  levalbuterol Penne Lash) 0.63 MG/3ML nebulizer solution Take 0.63 mg by nebulization every 4 (four) hours as needed for wheezing or shortness of breath.   Yes Historical Provider, MD  losartan (COZAAR) 50 MG tablet Take 50 mg by mouth daily.    Yes Historical Provider, MD  oxyCODONE (ROXICODONE) 15 MG immediate release tablet Take 15 mg by mouth every 4 (four) hours as needed for pain. Patient takes 6 per day   Yes Historical Provider, MD  pantoprazole (PROTONIX) 40 MG tablet TAKE 1 TABLET TWICE DAILY 10/07/14  Yes Inda Castle, MD  Probiotic  Product [facility]) CAPS Take 1 capsule by mouth daily. Patient taking differently: Take 1 capsule by mouth daily with supper.  10/08/13  Yes Amy S Esterwood, PA-C  ciprofloxacin (CIPRO) 500 MG tablet Take 500 mg by mouth 2 (two) times daily. 10/01/14   Historical Provider, MD     Physical Exam: Filed Vitals:   10/13/14 1400 10/13/14 1430 10/13/14 1500 10/13/14 1613  BP: 156/101  169/67 168/83  Pulse: 101 105 89 91  Temp:    98.2 F (36.8 C)  TempSrc:    Oral  Resp: 21 16 17 18   SpO2: 100% 99% 100% 100%     Constitutional: Vital signs reviewed. Patient is a well-developed and well-nourished in no acute distress and cooperative with exam. Alert and oriented x3.  Head: Normocephalic and atraumatic  Ear: TM normal bilaterally  Mouth: no erythema or exudates, MMM  slightly dry  Eyes: Conjunctivae are normal. Pupils are equal, round, and reactive to light.  Neck: Normal range of motion. Neck supple.  Cardiovascular: Normal rate, regular rhythm and normal heart sounds.  Pulmonary/Chest: Effort normal and breath sounds normal. No respiratory distress. She has no wheezes. She has no rales.  Abdominal: Soft. Bowel sounds are normal.  Mild diffuse lower abdominal tenderness  Musculoskeletal: Normal range of motion. She exhibits no edema or tenderness.  Neurological: She is alert.  A & O x 3. Moving all extremities. 4/5 strength throughout.  Skin: Warm, dry and intact. No rash, cyanosis, or clubbing.  Psychiatric: Normal mood and affect. speech and behavior is normal. Judgment and thought content normal. Cognition and memory are normal.      Data Review   Micro Results No results found for this or any previous visit (from the past 240 hour(s)).  Radiology Reports Dg Chest 2 View  09/29/2014   CLINICAL DATA:  Confusion. Recent bariatric surgery. Confusion for several weeks since the surgery.  EXAM: CHEST  2 VIEW  COMPARISON:  07/03/2014  FINDINGS: Normal heart size and pulmonary  vascularity. No focal airspace disease or consolidation in the lungs. No blunting of costophrenic angles. No pneumothorax. Mediastinal contours appear intact. Degenerative changes in the spine and shoulders. Probable degenerative cysts in the right humeral head. Calcification of the aorta.  IMPRESSION: No active cardiopulmonary disease.   Electronically Signed   By: Lucienne Capers M.D.   On: 09/29/2014 21:28   Ct Head  Wo Contrast  09/29/2014   CLINICAL DATA:  Confusion.  No longer recognizes people or places.  EXAM: CT HEAD WITHOUT CONTRAST  TECHNIQUE: Contiguous axial images were obtained from the base of the skull through the vertex without intravenous contrast.  COMPARISON:  None.  FINDINGS: The brain shows generalized atrophy, somewhat advanced for age. No sign of old or acute focal infarction, mass lesion, hemorrhage, hydrocephalus or extra-axial collection. The calvarium is unremarkable. Sinuses, middle ears and mastoids are clear. There is atherosclerotic calcification of the major vessels at the base of the brain.  IMPRESSION: Atrophy, advanced for age.  No focal or acute finding.   Electronically Signed   By: Nelson Chimes M.D.   On: 09/29/2014 20:51   Ct Abdomen Pelvis W Contrast  10/13/2014   CLINICAL DATA:  Left groin pain, constipation, and decreased oral intake. History of diverticulitis.  EXAM: CT ABDOMEN AND PELVIS WITH CONTRAST  TECHNIQUE: Multidetector CT imaging of the abdomen and pelvis was performed using the standard protocol following bolus administration of intravenous contrast.  CONTRAST:  117mL OMNIPAQUE IOHEXOL 300 MG/ML  SOLN  COMPARISON:  Abdominal radiographs earlier today. CT abdomen 11/24/2012. CT abdomen and pelvis 01/25/2007.  FINDINGS: Mosaic attenuation in the lung bases may reflect respiratory motion artifact and subsegmental atelectasis although a component of air trapping is also possible. There is no pleural effusion.  The diffusely decreased attenuation of the liver is  compatible with mild steatosis. The gallbladder is surgically absent. Mild extrahepatic biliary dilatation is slightly more prominent than on the prior study but most likely related to prior cholecystectomy and measuring up to 1.2 cm in diameter. The spleen, left adrenal gland, and pancreas are unremarkable. Right adrenal calcification is unchanged and may reflect prior hemorrhage. Subcentimeter hypodense lesions in the kidneys are too small to fully characterize but most likely represent cysts.  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 Oral contrast is present in the stomach and in multiple nondilated loops of small bowel without evidence of obstruction. Diffuse colonic diverticulosis is noted without evidence of diverticulitis. Ingested medication capsules are noted scattered throughout the colon and in the rectum. The appendix is not clearly identified, however no inflammatory changes are seen in the right lower quadrant. There is a small amount of stool throughout the colon and rectum.  The uterus is absent. The ovaries and bladder are unremarkable. Mild abdominal aortic atherosclerosis is noted. No free fluid or enlarged lymph nodes are identified. Severe degenerative changes are present involving both hips with complete joint space loss, subchondral sclerosis and cyst formation, and flattening of the femoral heads. Thoracolumbar spondylosis is noted.  IMPRESSION: 1. No acute abnormality identified in the abdomen or pelvis. 2. Hepatic steatosis. 3. Colonic diverticulosis. 4. Advanced bilateral hip osteoarthrosis.   Electronically Signed   By: Logan Bores   On: 10/13/2014 12:50   Dg Abd Acute W/chest  10/13/2014   CLINICAL DATA:  Altered mental status for 3 days which worsened last night. Initial encounter.  EXAM: DG ABDOMEN ACUTE W/ 1V CHEST  COMPARISON:  PA and lateral chest 09/29/2014. CT abdomen 11/24/2012.  FINDINGS: Single view of the chest demonstrates clear lungs and normal heart size. No  pneumothorax or pleural fluid.  Two views of the abdomen show no free intraperitoneal air. The bowel gas pattern is normal. No unexpected abdominal calcification is seen. There is convex right lumbar scoliosis and multilevel spondylosis. Advanced degenerative change is present about the hips.  IMPRESSION: No acute abnormality.  Advanced bilateral hip  osteoarthritis. Convex right lumbar scoliosis and spondylosis also noted.   Electronically Signed   By: Inge Rise M.D.   On: 10/13/2014 08:46     CBC  Recent Labs Lab 10/13/14 0755  WBC 6.9  HGB 11.5*  HCT 36.6  PLT 199  MCV 77.2*  MCH 24.3*  MCHC 31.4  RDW 19.8*  LYMPHSABS 1.6  MONOABS 0.5  EOSABS 0.0  BASOSABS 0.0    Chemistries   Recent Labs Lab 10/13/14 0755  NA 135  K 3.2*  CL 101  CO2 20*  GLUCOSE 125*  BUN 14  CREATININE 0.69  CALCIUM 9.4  AST 18  ALT 18  ALKPHOS 89  BILITOT 0.4   ------------------------------------------------------------------------------------------------------------------ estimated creatinine clearance is 81.5 mL/min (by C-G formula based on Cr of 0.69). ------------------------------------------------------------------------------------------------------------------ No results for input(s): HGBA1C in the last 72 hours. ------------------------------------------------------------------------------------------------------------------ No results for input(s): CHOL, HDL, LDLCALC, TRIG, CHOLHDL, LDLDIRECT in the last 72 hours. ------------------------------------------------------------------------------------------------------------------ No results for input(s): TSH, T4TOTAL, T3FREE, THYROIDAB in the last 72 hours.  Invalid input(s): FREET3 ------------------------------------------------------------------------------------------------------------------ No results for input(s): VITAMINB12, FOLATE, FERRITIN, TIBC, IRON, RETICCTPCT in the last 72 hours.  Coagulation profile No results  for input(s): INR, PROTIME in the last 168 hours.  No results for input(s): DDIMER in the last 72 hours.  Cardiac Enzymes No results for input(s): CKMB, TROPONINI, MYOGLOBIN in the last 168 hours.  Invalid input(s): CK ------------------------------------------------------------------------------------------------------------------ Invalid input(s): POCBNP   CBG:  Recent Labs Lab 10/13/14 0747  GLUCAP 128*       EKG: Independently reviewed.    Assessment/Plan Active Problems:   Confusion   Acute confusional state  #1 acute confusional state Patient is on high-dose narcotics at home, currently no acute pathology seen on abdominal CT and chest x-ray, UA negative No underlying infectious etiology to explain the confusion Have discussed with the patient's husband slowly cut back on her oxycodone, the patient does go to a pain clinic and was managed over there MRI of the brain to rule out stroke, patient is very deconditioned and may benefit from some therapy , will order PT OT consultation Currently the patient is neurologically intact but has a very sluggish speech According to the husband patient had a recent sleep study which was negative  #2 atrial fibrillation Continue with eliquis Patient is rate controlled  #3 hypertension-continue Cozaar, Cardizem,  #4 anxiety/ , continue with cymbalta   #5 Constipation start the patient on aggressive constipation regimen        Code Status:   full Family Communication: bedside Disposition Plan: admit   Total time spent 55 minutes.Greater than 50% of this time was spent in counseling, explanation of diagnosis, planning of further management, and coordination of care  [facility] Pager [phone]  If 7PM-7AM, please contact night-coverage [email or web address] Password [facility] 10/13/2014, 5:34 PM

## 2014-10-13 NOTE — ED Notes (Signed)
MD at bedside.[email or web address]

## 2014-10-13 NOTE — ED Notes (Signed)
Family sts pt has been constipated recently as well

## 2014-10-13 NOTE — ED Notes (Addendum)
Pt here with family c/o AMS x 3 days worse last night with increased confusion; pt alert at present; pt with hx of gastric sleeve sx in May and family thinks may be dehydrated due to decreased PO intake; per family pt has difficulty ambulating; pt recently stopped taking antibiotics for UTI

## 2014-10-13 NOTE — ED Notes (Signed)
Report attempted nurse to call back.  

## 2014-10-13 NOTE — ED Provider Notes (Addendum)
CSN: [id number]     Arrival date & time 10/13/14  0714 History   First MD Initiated Contact with Patient 10/13/14 0825     Chief Complaint  Patient presents with  . Altered Mental Status     (Consider location/radiation/quality/duration/timing/severity/associated sxs/prior Treatment) The history is provided by the EMS personnel and a relative.  [name] is a 69 y.o. female hx of HTN, HL, recent gastric sleeve surgery, afib on eliquis, here with confusion. Patient had gastric sleeve surgery in April and since then has been progressively more confused. She has sometimes trouble recognizing family and is disoriented. She sometimes has some visual hallucinations as well. She was seen in the ER several weeks ago and had CT head that was unremarkable and labs unremarkable. She was thought to be confused from polypharmacy so medications were adjusted. She then was diagnosed with urinary tract infection and finished a course of Cipro 5 days ago. Since then she has worsening confusion and sundowning. She has not been eating much but denies any vomiting. Does have worsening pain from her hips and has severe hip arthritis at baseline.    Past Medical History  Diagnosis Date  . Asthma   . Arthritis   . Hypertension   . Hypercholesteremia   . Unstable angina 09/09/2011  . HTN (hypertension) 09/09/2011  . Hyperlipemia 09/09/2011  . GERD (gastroesophageal reflux disease) 09/09/2011  . Anxiety   . Diverticulosis   . Antral polyp     benign  . IBS (irritable bowel syndrome)   . Obesity   . Internal hemorrhoid   . Vocal cord dysfunction 2013    swelling  . Shortness of breath   . Tachycardia   . Paroxysmal atrial fibrillation   . PONV (postoperative nausea and vomiting)     with big surgeries, none with endos etc, with colonoscopy- 5-6 years ago could not swallow   . Urinary incontinence   . Sleep apnea     borderline per patient no cpap    Past Surgical History  Procedure Laterality  Date  . Total knee arthroplasty Bilateral     left 2002, right 2012  . Cholecystectomy    . Abdominal hysterectomy  1988  . Tumor excision  1964    rt. leg fatty tumor  . Shoulder surgery Right 1987  . Wrist surgery Left 1997, 2009   . Elbow surgery Left     removed bone chip  . Foot osteotomy w/ plantar fascia release Left   . Total ankle replacement Right     x 3  . Hip ilioban release Right 1997  . Total ankle replacement Right 2012  . Ankle fusion Right     x 2  . Thumb replacement Bilateral     one 2009 and one 2014  . Back surgery  [id number]    3 ruptured discs, lower back  . Nasal sinus surgery  1997  . Esophagogastroduodenoscopy [name] 03/06/2013    Procedure: ESOPHAGOGASTRODUODENOSCOPY (EGD);  Surgeon: Inda Castle, MD;  Location: [facility];  Service: Endoscopy;  Laterality: [name];  . Bravo ph study [name] 03/06/2013    Procedure: BRAVO [place] STUDY;  Surgeon: Inda Castle, MD;  Location: [facility];  Service: Endoscopy;  Laterality: [name];  . Tfc wrist Left 1997  . Left heart catheterization with coronary angiogram Bilateral 09/10/2011    Procedure: LEFT HEART CATHETERIZATION WITH CORONARY ANGIOGRAM;  Surgeon: Lorretta Harp, MD;  Location: [facility] CATH LAB;  Service: Cardiovascular;  Laterality: Bilateral;  .  Laparoscopic gastric sleeve resection with hiatal hernia repair  08/20/2014    Procedure: LAPAROSCOPIC GASTRIC SLEEVE RESECTION WITH HIATAL HERNIA  REPAIR AND  UPPER ENDOSCOPY;  Surgeon: Johnathan Hausen, MD;  Location: [facility] ORS;  Service: General;;  . Upper gi endoscopy  08/20/2014    Procedure: UPPER GI ENDOSCOPY;  Surgeon: Johnathan Hausen, MD;  Location: [facility] ORS;  Service: General;;  . Cardiac catheterization  2013    NORMAL  . Transthoracic echocardiogram  09/09/2011    MILD CONCENTRIC LVH. TRACE MITRAL REGURG. TRACE TR. MILD AORTIC REGURG.   Family History  Problem Relation Age of Onset  . Hypertension Mother   . Diabetes Mother   . Arthritis Mother   .  Esophageal cancer Mother   . Esophageal cancer Father 26  . Coronary artery disease Neg Hx   . Colon cancer Neg Hx   . Liver disease Neg Hx   . Kidney disease Neg Hx   . Other Sister     kidney mass removed  . Hypertension Sister   . Heart disease Maternal Grandmother   . Stroke Paternal Grandmother    History  Substance Use Topics  . Smoking status: Never Smoker   . Smokeless tobacco: Never Used  . Alcohol Use: No   OB History    No data available     Review of Systems  Gastrointestinal: Positive for abdominal pain and constipation.  Psychiatric/Behavioral: Positive for confusion.  All other systems reviewed and are negative.     Allergies  Ativan and Morphine and related  Home Medications   Prior to Admission medications   Medication Sig Start Date End Date Taking? Authorizing Provider  acetaminophen (TYLENOL) 500 MG tablet Take 1,000 mg by mouth 3 (three) times daily as needed for moderate pain or headache.    Yes Historical Provider, MD  albuterol (PROVENTIL HFA;VENTOLIN HFA) 108 (90 BASE) MCG/ACT inhaler Inhale 1-2 puffs into the lungs every 6 (six) hours as needed for wheezing or shortness of breath.   Yes Historical Provider, MD  apixaban (ELIQUIS) 5 MG TABS tablet Take 1 tablet (5 mg total) by mouth 2 (two) times daily. 10/09/14  Yes Lorretta Harp, MD  budesonide (PULMICORT) 0.5 MG/2ML nebulizer solution Take 2 mLs (0.5 mg total) by nebulization 2 (two) times daily. Patient taking differently: Take 0.5 mg by nebulization 2 (two) times daily as needed (shorntess of breathe.).  07/03/13  Yes Chesley Mires, MD  diclofenac (VOLTAREN) 75 MG EC tablet Take 75 mg by mouth 2 (two) times daily.   Yes Historical Provider, MD  diltiazem (CARDIZEM CD) 180 MG 24 hr capsule Take 1 capsule (180 mg total) by mouth daily. 10/09/14  Yes Lorretta Harp, MD  DULoxetine (CYMBALTA) 60 MG capsule Take 1 capsule by mouth daily with supper. 01/07/14  Yes Historical Provider, MD   levalbuterol Penne Lash) 0.63 MG/3ML nebulizer solution Take 0.63 mg by nebulization every 4 (four) hours as needed for wheezing or shortness of breath.   Yes Historical Provider, MD  losartan (COZAAR) 50 MG tablet Take 50 mg by mouth daily.    Yes Historical Provider, MD  oxyCODONE (ROXICODONE) 15 MG immediate release tablet Take 15 mg by mouth every 4 (four) hours as needed for pain. Patient takes 6 per day   Yes Historical Provider, MD  pantoprazole (PROTONIX) 40 MG tablet TAKE 1 TABLET TWICE DAILY 10/07/14  Yes Inda Castle, MD  Probiotic Product [facility]) CAPS Take 1 capsule by mouth daily. Patient taking differently: Take 1  capsule by mouth daily with supper.  10/08/13  Yes Amy S Esterwood, PA-C  ciprofloxacin (CIPRO) 500 MG tablet Take 500 mg by mouth 2 (two) times daily. 10/01/14   Historical Provider, MD   BP 156/101 mmHg  Pulse 101  Temp(Src) 97.5 F (36.4 C) (Oral)  Resp 21  SpO2 100% Physical Exam  Constitutional:  Chronically ill, dehydrated   HENT:  Head: Normocephalic.  MM slightly dry   Eyes: Conjunctivae are normal. Pupils are equal, round, and reactive to light.  Neck: Normal range of motion. Neck supple.  Cardiovascular: Normal rate, regular rhythm and normal heart sounds.   Pulmonary/Chest: Effort normal and breath sounds normal. No respiratory distress. She has no wheezes. She has no rales.  Abdominal: Soft. Bowel sounds are normal.  Mild diffuse lower abdominal tenderness   Musculoskeletal: Normal range of motion. She exhibits no edema or tenderness.  Neurological: She is alert.  A & O x 3. Moving all extremities. 4/5 strength throughout.   Skin: Skin is warm and dry.  Psychiatric: She has a normal mood and affect. Her behavior is normal. Judgment and thought content normal.  Nursing note and vitals reviewed.   ED Course  Procedures (including critical care time) Labs Review Labs Reviewed  COMPREHENSIVE METABOLIC PANEL - Abnormal; Notable for the following:     Potassium 3.2 (*)    CO2 20 (*)    Glucose, Bld 125 (*)    Total Protein 6.4 (*)    All other components within normal limits  CBC WITH DIFFERENTIAL/PLATELET - Abnormal; Notable for the following:    Hemoglobin 11.5 (*)    MCV 77.2 (*)    MCH 24.3 (*)    RDW 19.8 (*)    All other components within normal limits  URINALYSIS, ROUTINE W REFLEX MICROSCOPIC (NOT AT [facility]) - Abnormal; Notable for the following:    Color, Urine AMBER (*)    APPearance CLOUDY (*)    Bilirubin Urine MODERATE (*)    Ketones, ur 40 (*)    Leukocytes, UA SMALL (*)    All other components within normal limits  URINE MICROSCOPIC-ADD ON - Abnormal; Notable for the following:    Crystals CA OXALATE CRYSTALS (*)    All other components within normal limits  CBG MONITORING, ED - Abnormal; Notable for the following:    Glucose-Capillary 128 (*)    All other components within normal limits  URINE CULTURE  I-STAT CG4 LACTIC ACID, ED  I-STAT CG4 LACTIC ACID, ED    Imaging Review Ct Abdomen Pelvis W Contrast  10/13/2014   CLINICAL DATA:  Left groin pain, constipation, and decreased oral intake. History of diverticulitis.  EXAM: CT ABDOMEN AND PELVIS WITH CONTRAST  TECHNIQUE: Multidetector CT imaging of the abdomen and pelvis was performed using the standard protocol following bolus administration of intravenous contrast.  CONTRAST:  133mL OMNIPAQUE IOHEXOL 300 MG/ML  SOLN  COMPARISON:  Abdominal radiographs earlier today. CT abdomen 11/24/2012. CT abdomen and pelvis 01/25/2007.  FINDINGS: Mosaic attenuation in the lung bases may reflect respiratory motion artifact and subsegmental atelectasis although a component of air trapping is also possible. There is no pleural effusion.  The diffusely decreased attenuation of the liver is compatible with mild steatosis. The gallbladder is surgically absent. Mild extrahepatic biliary dilatation is slightly more prominent than on the prior study but most likely related to prior  cholecystectomy and measuring up to 1.2 cm in diameter. The spleen, left adrenal gland, and pancreas are unremarkable. Right adrenal  calcification is unchanged and may reflect prior hemorrhage. Subcentimeter hypodense lesions in the kidneys are too small to fully characterize but most likely represent cysts.  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 Oral contrast is present in the stomach and in multiple nondilated loops of small bowel without evidence of obstruction. Diffuse colonic diverticulosis is noted without evidence of diverticulitis. Ingested medication capsules are noted scattered throughout the colon and in the rectum. The appendix is not clearly identified, however no inflammatory changes are seen in the right lower quadrant. There is a small amount of stool throughout the colon and rectum.  The uterus is absent. The ovaries and bladder are unremarkable. Mild abdominal aortic atherosclerosis is noted. No free fluid or enlarged lymph nodes are identified. Severe degenerative changes are present involving both hips with complete joint space loss, subchondral sclerosis and cyst formation, and flattening of the femoral heads. Thoracolumbar spondylosis is noted.  IMPRESSION: 1. No acute abnormality identified in the abdomen or pelvis. 2. Hepatic steatosis. 3. Colonic diverticulosis. 4. Advanced bilateral hip osteoarthrosis.   Electronically Signed   By: Logan Bores   On: 10/13/2014 12:50   Dg Abd Acute W/chest  10/13/2014   CLINICAL DATA:  Altered mental status for 3 days which worsened last night. Initial encounter.  EXAM: DG ABDOMEN ACUTE W/ 1V CHEST  COMPARISON:  PA and lateral chest 09/29/2014. CT abdomen 11/24/2012.  FINDINGS: Single view of the chest demonstrates clear lungs and normal heart size. No pneumothorax or pleural fluid.  Two views of the abdomen show no free intraperitoneal air. The bowel gas pattern is normal. No unexpected abdominal calcification is seen. There is convex right  lumbar scoliosis and multilevel spondylosis. Advanced degenerative change is present about the hips.  IMPRESSION: No acute abnormality.  Advanced bilateral hip osteoarthritis. Convex right lumbar scoliosis and spondylosis also noted.   Electronically Signed   By: Inge Rise M.D.   On: 10/13/2014 08:46     EKG Interpretation None      MDM   Final diagnoses:  Abdominal pain   [name] is a 69 y.o. female here with intermittent confusion. Likely multi factorial- poly pharmacy vs infection vs dementia vs complication from gastric sleeve. Will get labs, UA, CXR. Will get CT ab/pel to assess gastric sleeve. Likely need rehab vs nursing home vs home health aid to take care of her.   2:14 PM Given fentanyl but had hives improved with benadryl. K 3.2. Bicarb 20. Appears dehydrated. Given IVF, CT showed no SBO. UA contaminated. Urine culture sent. Held abx. Will admit for IVF. Discussed with family regarding possible rehab vs nursing home but they are not interested. Would rather get her hydrated overnight.     Wandra Arthurs, MD 10/13/14 [place], MD 10/13/14 [phone]

## 2014-10-14 DIAGNOSIS — F05 Delirium due to known physiological condition: Secondary | ICD-10-CM | POA: Diagnosis not present

## 2014-10-14 DIAGNOSIS — T40601A Poisoning by unspecified narcotics, accidental (unintentional), initial encounter: Secondary | ICD-10-CM | POA: Diagnosis not present

## 2014-10-14 LAB — COMPREHENSIVE METABOLIC PANEL
ALT: 15 U/L (ref 14–54)
AST: 17 U/L (ref 15–41)
Albumin: 3.3 g/dL — ABNORMAL LOW (ref 3.5–5.0)
Alkaline Phosphatase: 79 U/L (ref 38–126)
Anion gap: 8 (ref 5–15)
BILIRUBIN TOTAL: 0.3 mg/dL (ref 0.3–1.2)
BUN: 7 mg/dL (ref 6–20)
CALCIUM: 8.9 mg/dL (ref 8.9–10.3)
CO2: 24 mmol/L (ref 22–32)
CREATININE: 0.55 mg/dL (ref 0.44–1.00)
Chloride: 107 mmol/L (ref 101–111)
GFR calc Af Amer: >60 mL/min (ref >60)
Glucose, Bld: 110 mg/dL — ABNORMAL HIGH (ref 65–99)
POTASSIUM: 4 mmol/L (ref 3.5–5.1)
SODIUM: 139 mmol/L (ref 135–145)
Total Protein: 5.8 g/dL — ABNORMAL LOW (ref 6.5–8.1)

## 2014-10-14 LAB — HEMOGLOBIN A1C
Hgb A1c MFr Bld: 5.8 % — ABNORMAL HIGH (ref 4.8–5.6)
MEAN PLASMA GLUCOSE: 120 mg/dL

## 2014-10-14 LAB — GLUCOSE, CAPILLARY: GLUCOSE-CAPILLARY: 103 mg/dL — AB (ref 65–99)

## 2014-10-14 LAB — TROPONIN I

## 2014-10-14 MED ORDER — IBUPROFEN 600 MG PO TABS
600.0000 mg | ORAL_TABLET | Freq: Three times a day (TID) | ORAL | Status: DC | PRN
  Administered 2014-10-14: 600 mg via ORAL
  Filled 2014-10-14: qty 1

## 2014-10-14 MED ORDER — SODIUM CHLORIDE 0.9 % IV SOLN
INTRAVENOUS | Status: AC
  Administered 2014-10-14 – 2014-10-15 (×2): via INTRAVENOUS

## 2014-10-14 MED ORDER — IBUPROFEN 600 MG PO TABS
600.0000 mg | ORAL_TABLET | Freq: Three times a day (TID) | ORAL | Status: DC | PRN
  Administered 2014-10-14 – 2014-10-15 (×3): 600 mg via ORAL
  Filled 2014-10-14 (×3): qty 1

## 2014-10-14 MED ORDER — POTASSIUM CHLORIDE IN NACL 40-0.9 MEQ/L-% IV SOLN
INTRAVENOUS | Status: DC
  Administered 2014-10-14: 75 mL/h via INTRAVENOUS
  Filled 2014-10-14 (×2): qty 1000

## 2014-10-14 MED ORDER — OXYCODONE HCL 5 MG PO TABS
5.0000 mg | ORAL_TABLET | Freq: Two times a day (BID) | ORAL | Status: DC | PRN
  Administered 2014-10-14: 5 mg via ORAL
  Filled 2014-10-14 (×2): qty 1

## 2014-10-14 NOTE — Evaluation (Signed)
Physical Therapy Evaluation Patient Details Name: [name] MRN: [id number] DOB: Mar 07, 1946 Today's Date: 10/14/2014   History of Present Illness  69 y.o. female admitted to [facility] on 10/13/14 for AMS suspect due to narcotic use due to chronic bil hip and knee arthritis.  Pt with significant PMHx of HTN, anxiety, SOB, tachycardia, PAF, R total ankle replacement, multiple back surgeries, R shoulder surgery, and "weight loss surgery".  Clinical Impression  Pt was able to get up OOB to the chair and participate in short distance gait with PT/OT.  She needs quite a bit of motivation to participate partially due to pain, but she also seems to have some reasoning deficits that may be tied into her AMS.  PT/OT and husband did our best to encourage OOB mobility, but pt was very resistant even after being pre medicated.  PT will continue to follow acutely and recommends that she continue with OP PT aquatic therapy (she likes it, but has only been once), but with increased frequency to 5 times per week to help encourage daily mobility and reach her goal of having her hip replaced.      Follow Up Recommendations Outpatient PT;Supervision/Assistance - 24 hour (Resume OP PT aquatic therapy 5xs/wk recommended)    Equipment Recommendations  None recommended by PT    Recommendations for Other Services   NA    Precautions / Restrictions Precautions Precautions: Fall Precaution Comments: pt with h/o falls Restrictions Weight Bearing Restrictions: No RLE Weight Bearing: Weight bearing as tolerated LLE Weight Bearing: Weight bearing as tolerated      Mobility  Bed Mobility Overal bed mobility: Needs Assistance;+2 for physical assistance Bed Mobility: Sit to Supine     Supine to sit: +2 for physical assistance;Mod assist;HOB elevated     General bed mobility comments: Two person mod assist to help progress legs over EOB, support trunk for last 2/3 of supine to sit and help to weight shift hips  to scoot to EOB.   Transfers Overall transfer level: Needs assistance Equipment used: Rolling walker (2 wheeled) Transfers: Sit to/from Stand Sit to Stand: +2 physical assistance;Min assist         General transfer comment: Two person min assist to stabilize the RW and support trunk during transitions. Verbal cues for safe hand placement and slow descent to sit.   Ambulation/Gait Ambulation/Gait assistance: +2 physical assistance;Min assist Ambulation Distance (Feet): 5 Feet Assistive device: Rolling walker (2 wheeled) Gait Pattern/deviations: Step-to pattern;Antalgic;Trunk flexed Gait velocity: decreased Gait velocity interpretation: Below normal speed for age/gender General Gait Details: Two person min assist to support pt's trunk and steer RW during gait.  Chair to follow to encourage increased gait distance and safety.  Pt's joints could be audibly heard during gait, creeking and poping.  Pt reporting pain and inability to go further.          Balance Overall balance assessment: Needs assistance Sitting-balance support: Feet supported;Bilateral upper extremity supported Sitting balance-Leahy Scale: Fair     Standing balance support: Bilateral upper extremity supported Standing balance-Leahy Scale: Poor                               Pertinent Vitals/Pain Pain Assessment: Faces Faces Pain Scale: Hurts worst Pain Location: bil legs, hips and knees  Pain Descriptors / Indicators: Grimacing;Guarding Pain Intervention(s): Limited activity within patient's tolerance;Monitored during session;Premedicated before session;Repositioned    Home Living Family/patient expects to be discharged to:: Private  residence Living Arrangements: Spouse/significant other Available Help at Discharge: Family;Available 24 hours/day Type of Home: House Home Access: Ramped entrance     Home Layout: One level Home Equipment: Walker - 2 wheels;Walker - 4 wheels;Bedside  commode;Wheelchair - manual;Shower seat      Prior Function Level of Independence: Needs assistance   Gait / Transfers Assistance Needed: husband assists with gait with RW.  Pt spends most of her day in bed due to pain.    ADL's / Homemaking Assistance Needed: Husband assists.  Pt is able to preform her own peri care, but he helps bathe, dress, mobilize her.    Comments: Pt very limited 2* arthritic hips and obesity     Hand Dominance   Dominant Hand: Right    Extremity/Trunk Assessment   Upper Extremity Assessment: Defer to OT evaluation           Lower Extremity Assessment: RLE deficits/detail;LLE deficits/detail RLE Deficits / Details: bil LE functional weakness and audible crepitus during transitions from sit <->stand as well ad during gait.  Per husband report her left hip is the more arthritic side and she is trying to lose 10 more lbs to qualify for hip replacement surgery.  Pt functionally was unable, without assitance, to move bil legs over to the side of the bed, yet she had enough strength to support her body weight in standing and walk ~5' with the RW.  LLE Deficits / Details: bil LE functional weakness and audible crepitus during transitions from sit <->stand as well ad during gait.  Per husband report her left hip is the more arthritic side and she is trying to lose 10 more lbs to qualify for hip replacement surgery.  Pt functionally was unable, without assitance, to move bil legs over to the side of the bed, yet she had enough strength to support her body weight in standing and walk ~5' with the RW.   Cervical / Trunk Assessment: Other exceptions  Communication   Communication: No difficulties  Cognition Arousal/Alertness: Awake/alert Behavior During Therapy: WFL for tasks assessed/performed Overall Cognitive Status: Impaired/Different from baseline Area of Impairment: Safety/judgement;Awareness;Problem solving         Safety/Judgement: Decreased awareness of  safety;Decreased awareness of deficits Awareness: Emergent Problem Solving: Difficulty sequencing;Requires verbal cues General Comments: Pt with decreased clear reasoning.  She reported at one point during our session that she did not realize she was so weak until she went to her aquatic therapy session, yet she was not walking at home without assistance.  When this was pointed out she re-stated that it was not until her aquatic therapy that she realized how weak she had become.               Assessment/Plan    PT Assessment Patient needs continued PT services  PT Diagnosis Difficulty walking;Abnormality of gait;Generalized weakness;Acute pain   PT Problem List Decreased strength;Decreased range of motion;Decreased activity tolerance;Decreased balance;Decreased mobility;Decreased cognition;Decreased knowledge of use of DME;Decreased safety awareness  PT Treatment Interventions DME instruction;Gait training;Functional mobility training;Therapeutic activities;Therapeutic exercise;Balance training;Neuromuscular re-education;Cognitive remediation;Patient/family education;Modalities   PT Goals (Current goals can be found in the Care Plan section) Acute Rehab PT Goals Patient Stated Goal: pt would like to stay in the bed all day.  PT Goal Formulation: With family Time For Goal Achievement: 10/28/14 Potential to Achieve Goals: Fair    Frequency Min 3X/week        Co-evaluation PT/OT/SLP Co-Evaluation/Treatment: Yes Reason for Co-Treatment: For patient/therapist safety;Other (comment) (due to  pain) PT goals addressed during session: Mobility/safety with mobility;Balance;Proper use of DME;Strengthening/ROM         End of Session Equipment Utilized During Treatment: Gait belt Activity Tolerance: Patient limited by pain Patient left: in chair;with call bell/phone within reach;with family/visitor present Nurse Communication: Mobility status    Functional Assessment Tool Used: assist  level Functional Limitation: Mobility: Walking and moving around Mobility: Walking and Moving Around Current Status ([id number]): At least 20 percent but less than 40 percent impaired, limited or restricted Mobility: Walking and Moving Around Goal Status [phone]): At least 1 percent but less than 20 percent impaired, limited or restricted    Time: 1116-1150 PT Time Calculation (min) (ACUTE ONLY): 34 min   Charges:   PT Evaluation $Initial PT Evaluation Tier I: 1 Procedure     PT G Codes:   PT G-Codes **NOT FOR INPATIENT CLASS** Functional Assessment Tool Used: assist level Functional Limitation: Mobility: Walking and moving around Mobility: Walking and Moving Around Current Status (J6967): At least 20 percent but less than 40 percent impaired, limited or restricted Mobility: Walking and Moving Around Goal Status [phone]): At least 1 percent but less than 20 percent impaired, limited or restricted    Jarret Torre [place], [place], DPT [phone]   10/14/2014, 10:17 PM

## 2014-10-14 NOTE — Progress Notes (Signed)
Occupational Therapy Treatment Patient Details Name: [name] MRN: [id number] DOB: 1945-10-05 Today's Date: 10/14/2014    History of present illness 69 y.o. female admitted to [facility] on 10/13/14 for AMS suspect due to narcotic use due to chronic bil hip and knee arthritis.  Pt with significant PMHx of HTN, anxiety, SOB, tachycardia, PAF, R total ankle replacement, multiple back surgeries, R shoulder surgery, and "weight loss surgery".   OT comments  This 69 yo female admitted with above presents to acute OT with confusion and unable to get her to reason through and see how her point if view is not exactly are things are, decreased mobility and balance, as well as continued increased pain. She will continue to benefit from acute OT.  Follow Up Recommendations   (would like OPOT; however where pt is currently receiving OPPT they do not offer OT, do would recommend to increase her PT aquatic therapy to 5x/wk)    Equipment Recommendations  None recommended by OT       Precautions / Restrictions Precautions Precautions: Fall Precaution Comments: pt with h/o falls Restrictions Weight Bearing Restrictions: No RLE Weight Bearing: Weight bearing as tolerated LLE Weight Bearing: Weight bearing as tolerated       Mobility Bed Mobility Overal bed mobility: Needs Assistance Bed Mobility: Sit to Supine;Sit to Sidelying     Sit to supine: Max assist;HOB elevated (2nd time) Sit to sidelying: Max assist (1st time)    Transfers Overall transfer level: Needs assistance Equipment used: Stedy Transfers: Sit to/from Stand Sit to Stand: [employer] safety/equipment              Balance Overall balance assessment: Needs assistance Sitting-balance support: Feet supported;No upper extremity supported Sitting balance-Leahy Scale: Fair     Standing balance support: Bilateral upper extremity supported;During functional activity Standing balance-Leahy Scale: Poor                      ADL Overall ADL's : Needs assistance/impaired   Toilet Transfer: Minimal assistance;+2 for safety/equipment;BSC (using Stedy)   Toileting- Clothing Manipulation and Hygiene: Total assistance (with min guard for standing with Stedy)                          Cognition   Behavior During Therapy: WFL for tasks assessed/performed Overall Cognitive Status: Impaired/Different from baseline                  General Comments: Pt not happy with me when I entered room since it was 13:15 and she was to get back to bed with nursing staff around 12:45 (I aplogized for this and per RN they had left her up longer due to lunch had arrived). I  asked pt if she needed to use the BR before she got back to bed and she said no.  A'd  pt back to bed with use of Stedy (when asking her to pull up with both arms she said she had not had to do this last time, but she would have had to if she used the [place] with nursing earlier which she said she had). Pt back to bed and being quite silent with me and when asked why she said because she does not like people who are not honest with her--this was all due to having her work with [place] (OT/PT earlier), sitting up in recliner, and then not getting her back to bed when we said we would.  She then also said that we took her husband off to talk to him out of her presence--when in actuality she came to the desk and spoke to us)--her response when he corrected her this fact was that we had him on our side as well. Got her back to bed and while still in room she then said we had talked so long she now need to urinate (10 minutes after asking her and getting her back to bed)    Extremity/Trunk Assessment  Upper Extremity Assessment Upper Extremity Assessment: Generalized weakness                 Pertinent Vitals/ Pain       Pain Assessment: Faces Faces Pain Scale: Hurts worst Pain Location: Bil hips/knees Pain Descriptors / Indicators:  Discomfort;Crying;Grimacing;Aching Pain Intervention(s): Monitored during session;Repositioned;Patient requesting pain meds-RN notified         Frequency Min 2X/week     Progress Toward Goals  OT Goals(current goals can now be found in the care plan section)  Progress towards OT goals: Progressing toward goals  Acute Rehab OT Goals Patient Stated Goal: to not get up OT Goal Formulation: With family Time For Goal Achievement: 10/21/14 Potential to Achieve Goals: Good  Plan Discharge plan remains appropriate       End of Session Equipment Utilized During Treatment: Gait belt;Rolling walker   Activity Tolerance Patient limited by pain   Patient Left in bed;with call bell/phone within reach;with bed alarm set;with family/visitor present   Nurse Communication Patient requests pain meds    Functional Assessment Tool Used: Clinical observation Functional Limitation: Self care Self Care Current Status (D0301): At least 80 percent but less than 100 percent impaired, limited or restricted Self Care Goal Status (T1438): At least 60 percent but less than 80 percent impaired, limited or restricted   Time: 1301-1346 OT Time Calculation (min): 45 min  Charges: OT G-codes **NOT FOR INPATIENT CLASS** Functional Assessment Tool Used: Clinical observation Functional Limitation: Self care Self Care Current Status (O8757): At least 80 percent but less than 100 percent impaired, limited or restricted Self Care Goal Status (V7282): At least 60 percent but less than 80 percent impaired, limited or restricted OT General Charges $OT Visit: 1 Procedure OT Evaluation $Initial OT Evaluation Tier I: 1 Procedure OT Treatments $Self Care/Home Management : 38-52 mins  Almon Register [phone] 10/14/2014, 3:11 PM

## 2014-10-14 NOTE — Evaluation (Signed)
Occupational Therapy Evaluation Patient Details Name: [name] MRN: [id number] DOB: [birth year] Today's Date: 10/14/2014    History of Present Illness 69 y.o. female admitted to [facility] on 10/13/14 for AMS suspect due to narcotic use due to chronic bil hip and knee arthritis.  Pt with significant PMHx of HTN, anxiety, SOB, tachycardia, PAF, R total ankle replacement, multiple back surgeries, R shoulder surgery, and "weight loss surgery".   Clinical Impression   This 69 female admitted with above presents to acute OT with increased pain, decreased mobility, decreased balance, increased confusion, and obesity all affecting her ability to help care for herself at home. She would benefit from an inpatient rehab or SNF stay to increase her mobility and strength prior to hip surgery that she is hoping to have soon, however due to her insurance and observation status she does not qualify for either of these options. To keep her more mobile Outpatient services are the best (had just started outpt PT aquatic therapy last week--see follow up recommendations right below this). Will continue to follow.    Follow Up Recommendations   (would like OPOT; however where pt is currently receiving OPPT they do not offer OT, do would recommend to increase her PT aquatic therapy to 5x/wk)    Equipment Recommendations  None recommended by OT       Precautions / Restrictions Precautions Precautions: Fall Precaution Comments: pt with h/o falls Restrictions Weight Bearing Restrictions: No RLE Weight Bearing: Weight bearing as tolerated LLE Weight Bearing: Weight bearing as tolerated      Mobility Bed Mobility Overal bed mobility: Needs Assistance;+2 for physical assistance Bed Mobility: Supine to Sit     Supine to sit: Mod assist;+2 for physical assistance        Transfers Overall transfer level: Needs assistance Equipment used: Rolling walker (2 wheeled) Transfers: Sit to/from Stand Sit to  Stand: Min assist;+2 safety/equipment              Balance Overall balance assessment: Needs assistance Sitting-balance support: Feet supported;No upper extremity supported Sitting balance-Leahy Scale: Fair     Standing balance support: Bilateral upper extremity supported;During functional activity Standing balance-Leahy Scale: Poor                              ADL Overall ADL's : Needs assistance/impaired Eating/Feeding: Independent;Sitting   Grooming: Set up;Sitting   Upper Body Bathing: Minimal assitance;Sitting   Lower Body Bathing: Total assistance (with miin A sit<>stand)   Upper Body Dressing : Minimal assistance;Sitting   Lower Body Dressing: Total assistance (with min A sit<>stand)   Toilet Transfer: Minimal assistance;+2 for physical assistance;Ambulation;RW;BSC   Toileting- Clothing Manipulation and Hygiene: Total assistance (with min A sit<>stand)               Vision Additional Comments: No change from baseline          Pertinent Vitals/Pain Pain Assessment: Faces Faces Pain Scale: Hurts whole lot Pain Location: Bil hips/knees Pain Descriptors / Indicators: Discomfort;Grimacing;Aching (can hear and feel her creptius when she moves) Pain Intervention(s): Monitored during session;Repositioned     Hand Dominance Right   Extremity/Trunk Assessment Upper Extremity Assessment Upper Extremity Assessment: Generalized weakness   Lower Extremity Assessment Lower Extremity Assessment: Defer to PT evaluation       Communication Communication Communication: No difficulties   Cognition Arousal/Alertness: Awake/alert Behavior During Therapy: WFL for tasks assessed/performed Overall Cognitive Status: Impaired/Different from baseline  General Comments: We told pt from beginning that we were there to get her up and get her moving--the pt stated she was not up for this today and which point her husband interjected  that the doctor had told her that he wanted her up in the recliner and sitting up most of the day.  She did work with [place] and after walking 10 feet and sitting down in the recliner behind her she said she was ready to get back to bed. We explained and were firm with her that it was benefiical to be up to prevent PNA, blood clots, and further weakening.  We left her up in recliner with husband in room and asked her to sit up for 1 hour. I  called the NT and asked  her (April) to try and get pt back to bed from recliner around 12:45 with Stedy.  Pt not wanting to sit up and we could no reason with her that she needed to --she felt like we were not listening to her. I made her aware that we were hearing her and  also trying to help her get to her ultimate goal of getting hip surgery.              Home Living Family/patient expects to be discharged to:: Private residence Living Arrangements: Spouse/significant other Available Help at Discharge: Family;Available 24 hours/day Type of Home: House Home Access: Ramped entrance     Home Layout: One level     Bathroom Shower/Tub: Occupational psychologist: Standard     Home Equipment: Environmental consultant - 2 wheels;Walker - 4 wheels;Bedside commode;Wheelchair - manual;Shower seat          Prior Functioning/Environment Level of Independence: Needs assistance  Gait / Transfers Assistance Needed: husband assists with gait with RW.  Pt spends most of her day in bed due to pain.   ADL's / Homemaking Assistance Needed: Husband assists.  Pt is able to preform her own peri care, but he helps bathe, dress, mobilize her.   Communication / Swallowing Assistance Needed: NA Comments: Pt very limited 2* arthritic hips and obesity    OT Diagnosis: Generalized weakness;Cognitive deficits;Acute pain   OT Problem List: Decreased strength;Decreased range of motion;Decreased activity tolerance;Impaired balance (sitting and/or standing);Pain;Decreased  cognition;Obesity;Decreased knowledge of use of DME or AE   OT Treatment/Interventions: Self-care/ADL training;Patient/family education;Balance training;Therapeutic activities;DME and/or AE instruction    OT Goals(Current goals can be found in the care plan section) Acute Rehab OT Goals Patient Stated Goal: to not get up OT Goal Formulation: With family Time For Goal Achievement: 10/21/14 Potential to Achieve Goals: Good  OT Frequency: Min 2X/week              End of Session Equipment Utilized During Treatment: Gait belt;Rolling walker Nurse Communication:  (NT: please get pt back to bed around 12:45pm with Stedy)  Activity Tolerance: Patient limited by pain Patient left: in chair;with call bell/phone within reach;with family/visitor present   Time: 1116-1150 OT Time Calculation (min): 34 min Charges:  OT General Charges $OT Visit: 1 Procedure OT Evaluation $Initial OT Evaluation Tier I: 1 Procedure OT Treatments $Self Care/Home Management : 8-22 mins G-Codes: OT G-codes **NOT FOR INPATIENT CLASS** Functional Assessment Tool Used: Clinical observation Functional Limitation: Self care Self Care Current Status (Z0017): At least 80 percent but less than 100 percent impaired, limited or restricted Self Care Goal Status (C9449): At least 60 percent but less than 80 percent impaired, limited or restricted  Almon Register [phone] 10/14/2014, 2:54 PM

## 2014-10-14 NOTE — Progress Notes (Addendum)
Dr. Candiss Norse notified about patient's chronic pain and patient and family stating that MD stated he was going to put patient on another medication. Medication order placed by MD.

## 2014-10-14 NOTE — Progress Notes (Signed)
Patient Demographics:    [name], is a 69 y.o. female, DOB - Jun 26, 1945, DHR:[id number]  Admit date - 10/13/2014   Admitting Physician Reyne Dumas, MD  Outpatient Primary MD for the patient is [name], MD  LOS -    Chief Complaint  Patient presents with  . Altered Mental Status        Subjective:    [name] today has, No headache, No chest pain, No abdominal pain - No Nausea, No new weakness tingling or numbness, No Cough - SOB. Chronic bilateral hip pain, appears in no distress whatsoever   Assessment  & Plan :     1. Unintentional narcotic overdose with somnolence and confusion. Much improved with reduction in narcotic dose, CT MRI brain nonacute, no focal deficits, no headache. No alert awake oriented 3. Patient and husband extensively counseled on reducing narcotic use. We will place on Motrin with PPI. Continue Cymbalta.   2. Chronic atrial fibrillation. [place] VASC score is 3 or above - continue diltiazem and Eliquis.   3. 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 Supportive care. PT eval, increase activity, follow with outpatient PCP and orthopedics for definitive management.   4. GERD. On PPI   5. Essential hypertension. Continue ARB at home dose.   6. Depression on Cymbalta continue   Code Status : Full  Family Communication  : Husband bedside  Disposition Plan  : Home in a.m.  Consults  : None  Procedures  : CT, MRI brain unremarkable  DVT Prophylaxis  :  Eliquis  Lab Results  Component Value Date   PLT 157 10/14/2014    Inpatient Medications  Scheduled Meds: . apixaban  5 mg Oral BID  . diltiazem  180 mg Oral Daily  . DULoxetine  60 mg Oral Q supper  . losartan  50 mg Oral Daily  . pantoprazole  40 mg Oral BID  . sodium  chloride  3 mL Intravenous Q12H   Continuous Infusions: . 0.9 % NaCl with KCl 40 mEq / L 75 mL/hr (10/14/14 1008)   PRN Meds:.acetaminophen **OR** acetaminophen, albuterol, ibuprofen, ondansetron **OR** ondansetron (ZOFRAN) IV, oxyCODONE  Antibiotics  :    Anti-infectives    None        Objective:   Filed Vitals:   10/13/14 1613 10/13/14 2135 10/14/14 0536 10/14/14 1002  BP: 168/83 147/66 141/61 143/40  Pulse: 91 94 97 89  Temp: 98.2 F (36.8 C) 97.8 F (36.6 C) 97.6 F (36.4 C)   TempSrc: Oral Oral Oral   Resp: 18 17 18    SpO2: 100% 99% 99%     Wt Readings from Last 3 Encounters:  09/29/14 109.77 kg (242 lb)  09/03/14 118.661 kg (261 lb 9.6 oz)  08/23/14 108.863 kg (240 lb)     Intake/Output Summary (Last 24 hours) at 10/14/14 1134 Last data filed at 10/14/14 0900  Gross per 24 hour  Intake    320 ml  Output   1100 ml  Net   -780 ml     Physical Exam  Awake Alert, Oriented X 3, No new F.N deficits, Normal affect Vian.AT,PERRAL Supple Neck,No JVD, No cervical lymphadenopathy appriciated.  Symmetrical Chest wall movement, Good air movement bilaterally, CTAB RRR,No Gallops,Rubs or  new Murmurs, No Parasternal Heave +ve B.Sounds, Abd Soft, No tenderness, No organomegaly appriciated, No rebound - guarding or rigidity. No Cyanosis, Clubbing or edema, No new Rash or bruise       Data Review:   Micro Results No results found for this or any previous visit (from the past 240 hour(s)).  Radiology Reports Dg Chest 2 View  09/29/2014   CLINICAL DATA:  Confusion. Recent bariatric surgery. Confusion for several weeks since the surgery.  EXAM: CHEST  2 VIEW  COMPARISON:  07/03/2014  FINDINGS: Normal heart size and pulmonary vascularity. No focal airspace disease or consolidation in the lungs. No blunting of costophrenic angles. No pneumothorax. Mediastinal contours appear intact. Degenerative changes in the spine and shoulders. Probable degenerative cysts in the right  humeral head. Calcification of the aorta.  IMPRESSION: No active cardiopulmonary disease.   Electronically Signed   By: Lucienne Capers M.D.   On: 09/29/2014 21:28   Ct Head Wo Contrast  09/29/2014   CLINICAL DATA:  Confusion.  No longer recognizes people or places.  EXAM: CT HEAD WITHOUT CONTRAST  TECHNIQUE: Contiguous axial images were obtained from the base of the skull through the vertex without intravenous contrast.  COMPARISON:  None.  FINDINGS: The brain shows generalized atrophy, somewhat advanced for age. No sign of old or acute focal infarction, mass lesion, hemorrhage, hydrocephalus or extra-axial collection. The calvarium is unremarkable. Sinuses, middle ears and mastoids are clear. There is atherosclerotic calcification of the major vessels at the base of the brain.  IMPRESSION: Atrophy, advanced for age.  No focal or acute finding.   Electronically Signed   By: Nelson Chimes M.D.   On: 09/29/2014 20:51   Mr Brain Wo Contrast  10/13/2014   CLINICAL DATA:  Acute confusional state.  EXAM: MRI HEAD WITHOUT CONTRAST  TECHNIQUE: Multiplanar, multiecho pulse sequences of the brain and surrounding structures were obtained without intravenous contrast.  COMPARISON:  Head CT 09/29/2014  FINDINGS: Diffusion imaging does not show any acute or subacute infarction. There are are moderate chronic small-vessel ischemic changes affecting the pons. No focal cerebellar finding. The cerebral hemispheres show mild generalized atrophy with mild chronic small-vessel change of the white matter. No cortical or large vessel territory infarctions. No mass lesion, hemorrhage, hydrocephalus or extra-axial collection. No pituitary mass. No inflammatory sinus disease. No skull or skullbase lesion. Major vessels at the base of the brain show flow.  IMPRESSION: No acute or reversible finding.  Chronic small-vessel ischemic changes of the pons and cerebral hemispheric white matter.   Electronically Signed   By: Nelson Chimes M.D.    On: 10/13/2014 18:32   Ct Abdomen Pelvis W Contrast  10/13/2014   CLINICAL DATA:  Left groin pain, constipation, and decreased oral intake. History of diverticulitis.  EXAM: CT ABDOMEN AND PELVIS WITH CONTRAST  TECHNIQUE: Multidetector CT imaging of the abdomen and pelvis was performed using the standard protocol following bolus administration of intravenous contrast.  CONTRAST:  116mL OMNIPAQUE IOHEXOL 300 MG/ML  SOLN  COMPARISON:  Abdominal radiographs earlier today. CT abdomen 11/24/2012. CT abdomen and pelvis 01/25/2007.  FINDINGS: Mosaic attenuation in the lung bases may reflect respiratory motion artifact and subsegmental atelectasis although a component of air trapping is also possible. There is no pleural effusion.  The diffusely decreased attenuation of the liver is compatible with mild steatosis. The gallbladder is surgically absent. Mild extrahepatic biliary dilatation is slightly more prominent than on the prior study but most likely related to prior cholecystectomy  and measuring up to 1.2 cm in diameter. The spleen, left adrenal gland, and pancreas are unremarkable. Right adrenal calcification is unchanged and may reflect prior hemorrhage. Subcentimeter hypodense lesions in the kidneys are too small to fully characterize but most likely represent cysts.  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 Oral contrast is present in the stomach and in multiple nondilated loops of small bowel without evidence of obstruction. Diffuse colonic diverticulosis is noted without evidence of diverticulitis. Ingested medication capsules are noted scattered throughout the colon and in the rectum. The appendix is not clearly identified, however no inflammatory changes are seen in the right lower quadrant. There is a small amount of stool throughout the colon and rectum.  The uterus is absent. The ovaries and bladder are unremarkable. Mild abdominal aortic atherosclerosis is noted. No free fluid or enlarged lymph  nodes are identified. Severe degenerative changes are present involving both hips with complete joint space loss, subchondral sclerosis and cyst formation, and flattening of the femoral heads. Thoracolumbar spondylosis is noted.  IMPRESSION: 1. No acute abnormality identified in the abdomen or pelvis. 2. Hepatic steatosis. 3. Colonic diverticulosis. 4. Advanced bilateral hip osteoarthrosis.   Electronically Signed   By: Logan Bores   On: 10/13/2014 12:50   Dg Abd Acute W/chest  10/13/2014   CLINICAL DATA:  Altered mental status for 3 days which worsened last night. Initial encounter.  EXAM: DG ABDOMEN ACUTE W/ 1V CHEST  COMPARISON:  PA and lateral chest 09/29/2014. CT abdomen 11/24/2012.  FINDINGS: Single view of the chest demonstrates clear lungs and normal heart size. No pneumothorax or pleural fluid.  Two views of the abdomen show no free intraperitoneal air. The bowel gas pattern is normal. No unexpected abdominal calcification is seen. There is convex right lumbar scoliosis and multilevel spondylosis. Advanced degenerative change is present about the hips.  IMPRESSION: No acute abnormality.  Advanced bilateral hip osteoarthritis. Convex right lumbar scoliosis and spondylosis also noted.   Electronically Signed   By: Inge Rise M.D.   On: 10/13/2014 08:46     CBC  Recent Labs Lab 10/13/14 0755 10/14/14 0330  WBC 6.9 7.2  HGB 11.5* 8.9*  HCT 36.6 28.0*  PLT 199 157  MCV 77.2* 78.9  MCH 24.3* 25.1*  MCHC 31.4 31.8  RDW 19.8* 20.5*  LYMPHSABS 1.6  --   MONOABS 0.5  --   EOSABS 0.0  --   BASOSABS 0.0  --     Chemistries   Recent Labs Lab 10/13/14 0755 10/13/14 1700 10/14/14 0330  NA 135  --  139  K 3.2*  --  4.0  CL 101  --  107  CO2 20*  --  24  GLUCOSE 125*  --  110*  BUN 14  --  7  CREATININE 0.69  --  0.55  CALCIUM 9.4  --  8.9  MG  --  1.9  --   AST 18  --  17  ALT 18  --  15  ALKPHOS 89  --  79  BILITOT 0.4  --  0.3    ------------------------------------------------------------------------------------------------------------------ CrCl cannot be calculated (Unknown ideal weight.). ------------------------------------------------------------------------------------------------------------------ No results for input(s): HGBA1C in the last 72 hours. ------------------------------------------------------------------------------------------------------------------ No results for input(s): CHOL, HDL, LDLCALC, TRIG, CHOLHDL, LDLDIRECT in the last 72 hours. ------------------------------------------------------------------------------------------------------------------  Recent Labs  10/13/14 1700  TSH 0.771   ------------------------------------------------------------------------------------------------------------------ No results for input(s): VITAMINB12, FOLATE, FERRITIN, TIBC, IRON, RETICCTPCT in the last 72 hours.  Coagulation profile No results for  input(s): INR, PROTIME in the last 168 hours.  No results for input(s): DDIMER in the last 72 hours.  Cardiac Enzymes  Recent Labs Lab 10/13/14 1700 10/13/14 2131 10/14/14 0330  TROPONINI <0.03 <0.03 <0.03   ------------------------------------------------------------------------------------------------------------------ Invalid input(s): POCBNP   Time Spent in minutes  30   Wilmar Prabhakar K M.D on 10/14/2014 at 11:34 AM  Between 7am to 7pm - Pager - [phone]  After 7pm go to [email or web address] - password [facility]  [facility] -  Office  [phone]

## 2014-10-15 DIAGNOSIS — F05 Delirium due to known physiological condition: Secondary | ICD-10-CM | POA: Diagnosis not present

## 2014-10-15 DIAGNOSIS — T40601A Poisoning by unspecified narcotics, accidental (unintentional), initial encounter: Secondary | ICD-10-CM | POA: Diagnosis not present

## 2014-10-15 LAB — URINE CULTURE

## 2014-10-15 LAB — CBC
HCT: 28 % — ABNORMAL LOW (ref 36.0–46.0)
Hemoglobin: 8.9 g/dL — ABNORMAL LOW (ref 12.0–15.0)
MCH: 25.1 pg — ABNORMAL LOW (ref 26.0–34.0)
MCHC: 31.8 g/dL (ref 30.0–36.0)
MCV: 78.9 fL (ref 78.0–100.0)
Platelets: 157 10*3/uL (ref 150–400)
RBC: 3.55 MIL/uL — ABNORMAL LOW (ref 3.87–5.11)
RDW: 20.5 % — ABNORMAL HIGH (ref 11.5–15.5)
WBC: 7.2 10*3/uL (ref 4.0–10.5)

## 2014-10-15 MED ORDER — NONFORMULARY OR COMPOUNDED ITEM: Status: DC

## 2014-10-15 MED ORDER — OXYCODONE HCL 15 MG PO TABS: 15.0000 mg | ORAL_TABLET | Freq: Two times a day (BID) | ORAL | Status: DC | PRN

## 2014-10-15 MED ORDER — IBUPROFEN 600 MG PO TABS: 600.0000 mg | ORAL_TABLET | Freq: Two times a day (BID) | ORAL | Status: DC | PRN

## 2014-10-15 NOTE — Discharge Instructions (Signed)
Follow with Primary MD [name], MD in 7 days   Get CBC, CMP, 2 view Chest X ray checked  by Primary MD next visit.    Activity: As tolerated with Full fall precautions use walker/cane & assistance as needed   Disposition Home     Diet: Heart Healthy     For Heart failure patients - Check your Weight same time everyday, if you gain over 2 pounds, or you develop in leg swelling, experience more shortness of breath or chest pain, call your Primary MD immediately. Follow Cardiac Low Salt Diet and 1.5 lit/day fluid restriction.   On your next visit with your primary care physician please Get Medicines reviewed and adjusted.   Please request your Prim.MD to go over all Hospital Tests and Procedure/Radiological results at the follow up, please get all Hospital records sent to your Prim MD by signing hospital release before you go home.   If you experience worsening of your admission symptoms, develop shortness of breath, life threatening emergency, suicidal or homicidal thoughts you must seek medical attention immediately by calling 911 or calling your MD immediately  if symptoms less severe.  You Must read complete instructions/literature along with all the possible adverse reactions/side effects for all the Medicines you take and that have been prescribed to you. Take any new Medicines after you have completely understood and accpet all the possible adverse reactions/side effects.   Do not drive, operating heavy machinery, perform activities at heights, swimming or participation in water activities or provide baby sitting services if your were admitted for syncope or siezures until you have seen by Primary MD or a Neurologist and advised to do so again.  Do not drive when taking Pain medications.    Do not take more than prescribed Pain, Sleep and Anxiety Medications  Special Instructions: If you have smoked or chewed Tobacco  in the last 2 yrs please stop smoking, stop any regular  Alcohol  and or any Recreational drug use.  Wear Seat belts while driving.   Please note  You were cared for by a hospitalist during your hospital stay. If you have any questions about your discharge medications or the care you received while you were in the hospital after you are discharged, you can call the unit and asked to speak with the hospitalist on call if the hospitalist that took care of you is not available. Once you are discharged, your primary care physician will handle any further medical issues. Please note that NO REFILLS for any discharge medications will be authorized once you are discharged, as it is imperative that you return to your primary care physician (or establish a relationship with a primary care physician if you do not have one) for your aftercare needs so that they can reassess your need for medications and monitor your lab values.

## 2014-10-15 NOTE — Progress Notes (Signed)
Patient and husband given discharge instructions.  Patient husband verbalized understanding of discharge instruction.  PIV removed and area clean, dry, and intact.    Discharge Vitals Filed Vitals:   10/15/14 0952  BP: 128/70  Pulse: 93  Temp:   Resp:    Discharge medications   Medication List    STOP taking these medications        ciprofloxacin 500 MG tablet  Commonly known as:  CIPRO     diclofenac 75 MG EC tablet  Commonly known as:  VOLTAREN      TAKE these medications        acetaminophen 500 MG tablet  Commonly known as:  TYLENOL  Take 1,000 mg by mouth 3 (three) times daily as needed for moderate pain or headache.     albuterol 108 (90 BASE) MCG/ACT inhaler  Commonly known as:  PROVENTIL HFA;VENTOLIN HFA  Inhale 1-2 puffs into the lungs every 6 (six) hours as needed for wheezing or shortness of breath.     apixaban 5 MG Tabs tablet  Commonly known as:  ELIQUIS  Take 1 tablet (5 mg total) by mouth 2 (two) times daily.     budesonide 0.5 MG/2ML nebulizer solution  Commonly known as:  PULMICORT  Take 2 mLs (0.5 mg total) by nebulization 2 (two) times daily.     diltiazem 180 MG 24 hr capsule  Commonly known as:  CARDIZEM CD  Take 1 capsule (180 mg total) by mouth daily.     DULoxetine 60 MG capsule  Commonly known as:  CYMBALTA  Take 1 capsule by mouth daily with supper.     ibuprofen 600 MG tablet  Commonly known as:  ADVIL,MOTRIN  Take 1 tablet (600 mg total) by mouth every 12 (twelve) hours as needed for headache.     levalbuterol 0.63 MG/3ML nebulizer solution  Commonly known as:  XOPENEX  Take 0.63 mg by nebulization every 4 (four) hours as needed for wheezing or shortness of breath.     losartan 50 MG tablet  Commonly known as:  COZAAR  Take 50 mg by mouth daily.     NONFORMULARY OR COMPOUNDED ITEM  Resume outpatient water therapy as before to contraindications     oxyCODONE 15 MG immediate release tablet  Commonly known as:  ROXICODONE   Take 1 tablet (15 mg total) by mouth every 12 (twelve) hours as needed for pain. Patient takes 6 per day     pantoprazole 40 MG tablet  Commonly known as:  PROTONIX  TAKE 1 TABLET TWICE DAILY     RESTORA Caps  Take 1 capsule by mouth daily.       Patient escorted via wheelchair to car.   Forrestine Him, RN 10/15/14 at 1130am.

## 2014-10-15 NOTE — Care Management Note (Signed)
Case Management Note  Patient Details  Name: [name] MRN: [id number] Date of Birth: 04/18/1945  Subjective/Objective:                 Patient lives at home with husband. Patient goes to aquatic therapy three times a week, as arranged through her PMD. Patient stated she has gone twice and likes it, and would like to go everyday. Patient has medicare and her/ husband looking into what additional therapy days medicare will cover. NO further DME or home needs identified.     Action/Plan:  Pt to discharge to home today with husband.   Expected Discharge Date:                  Expected Discharge Plan:  Home/Self Care  In-House Referral:     Discharge planning Services  CM Consult  Post Acute Care Choice:    Choice offered to:  Patient  DME Arranged:    DME Agency:     HH Arranged:    [place] Agency:     Status of Service:  Completed, signed off  Medicare Important Message Given:    Date Medicare IM Given:    Medicare IM give by:    Date Additional Medicare IM Given:    Additional Medicare Important Message give by:     If discussed at [place] of Stay Meetings, dates discussed:    Additional Comments:  Carles Collet, RN 10/15/2014, 12:04 PM

## 2014-10-15 NOTE — Discharge Summary (Signed)
[name], is a 69 y.o. female  DOB 16-Sep-1945  MRN [id number].  Admission date:  10/13/2014  Admitting Physician  Reyne Dumas, MD  Discharge Date:  10/15/2014   Primary MD  [name], MD  Recommendations for primary care physician for things to follow:   Monitor mental status closely, minimize narcotics. Monitor BMP and GI symptoms on low-dose NSAID.   Admission Diagnosis  Dehydration [E86.0] Confusion [R41.0] Confusion state [F44.89] Abdominal pain [R10.9]   Discharge Diagnosis  Dehydration [E86.0] Confusion [R41.0] Confusion state [F44.89] Abdominal pain [R10.9]     Principal Problem:   Acute confusional state Active Problems:   Hyperlipemia   Morbid obesity with BMI of 45.0-49.9, adult   Diverticulosis of colon without hemorrhage   Degenerative arthritis of hip      Past Medical History  Diagnosis Date  . Asthma   . Arthritis   . Hypertension   . Hypercholesteremia   . Unstable angina 09/09/2011  . HTN (hypertension) 09/09/2011  . Hyperlipemia 09/09/2011  . GERD (gastroesophageal reflux disease) 09/09/2011  . Anxiety   . Diverticulosis   . Antral polyp     benign  . IBS (irritable bowel syndrome)   . Obesity   . Internal hemorrhoid   . Vocal cord dysfunction 2013    swelling  . Shortness of breath   . Tachycardia   . Paroxysmal atrial fibrillation   . PONV (postoperative nausea and vomiting)     with big surgeries, none with endos etc, with colonoscopy- 5-6 years ago could not swallow   . Urinary incontinence   . Sleep apnea     borderline per patient no cpap   . Confusion 10/13/2014    Past Surgical History  Procedure Laterality Date  . Total knee arthroplasty Bilateral     left 2002, right 2012  . Cholecystectomy    . Abdominal hysterectomy  1988  . Tumor excision   1964    rt. leg fatty tumor  . Shoulder surgery Right 1987  . Wrist surgery Left 1997, 2009   . Elbow surgery Left     removed bone chip  . Foot osteotomy w/ plantar fascia release Left   . Total ankle replacement Right     x 3  . Hip ilioban release Right 1997  . Total ankle replacement Right 2012  . Ankle fusion Right     x 2  . Thumb replacement Bilateral     one 2009 and one 2014  . Back surgery  [phone]    3 ruptured discs, lower back  . Nasal sinus surgery  1997  . Esophagogastroduodenoscopy [name] 03/06/2013    Procedure: ESOPHAGOGASTRODUODENOSCOPY (EGD);  Surgeon: Inda Castle, MD;  Location: [facility];  Service: Endoscopy;  Laterality: [name];  . Bravo ph study [name] 03/06/2013    Procedure: BRAVO [place] STUDY;  Surgeon: Inda Castle, MD;  Location: [facility];  Service: Endoscopy;  Laterality: [name];  . Tfc wrist Left 1997  . Left heart catheterization with coronary angiogram  Bilateral 09/10/2011    Procedure: LEFT HEART CATHETERIZATION WITH CORONARY ANGIOGRAM;  Surgeon: Lorretta Harp, MD;  Location: [facility] CATH LAB;  Service: Cardiovascular;  Laterality: Bilateral;  . Laparoscopic gastric sleeve resection with hiatal hernia repair  08/20/2014    Procedure: LAPAROSCOPIC GASTRIC SLEEVE RESECTION WITH HIATAL HERNIA  REPAIR AND  UPPER ENDOSCOPY;  Surgeon: Johnathan Hausen, MD;  Location: [facility] ORS;  Service: General;;  . Upper gi endoscopy  08/20/2014    Procedure: UPPER GI ENDOSCOPY;  Surgeon: Johnathan Hausen, MD;  Location: [facility] ORS;  Service: General;;  . Cardiac catheterization  2013    NORMAL  . Transthoracic echocardiogram  09/09/2011    MILD CONCENTRIC LVH. TRACE MITRAL REGURG. TRACE TR. MILD AORTIC REGURG.       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 Patient has a history of atrial fibrillation, followed by Dr. Gwenlyn Found, she held her eliquis for 7 days prior to surgery. Patient had some postop acute Delirium , after surgery which was attributed to lorazepam. Patient was also discharged home on large doses of oxycodone 15 mg every 4 hours. Patient was found to have intermittent confusion which was waxing and waning, worse in the morning. Husband states that the patient recently had a sleep study which was negative. She was recently treated with ciprofloxacin by her PCP. She completed ciprofloxacin on the 19th. Patient has had no fever, urinary symptoms, chest pain or shortness of breath. She simply had diffuse generalized weakness , worsening confusion when she wakes up in the morning.   In the ED the patient was found to be tachycardic but otherwise stable. She has been compliant with her medications. She has cut back her oxycodone to 7.5 mg every 4 hours. Workup in the ER including CT scan of the abdomen and pelvis, UA did not show any postop complications of small bowel obstruction. UA unimpressive. Patient was admitted for dehydration and possible nursing home placement.       Hospital Course:     1. Unintentional narcotic overdose with somnolence and confusion. Much improved with reduction in narcotic dose, CT MRI brain nonacute, no focal deficits, no headache. No alert awake oriented 3. Patient and husband extensively counseled on reducing narcotic use. Excellent response to low-dose Motrin in terms of pain control, she is already on  PPI. Continue Cymbalta. Will minimize narcotics upon discharge and place her on low-dose Motrin along with home dose PPI, request PCP to monitor renal function and GI side effects of NSAIDs closely.   2. Chronic atrial fibrillation. [place] VASC score is 3 or above - continue diltiazem and Eliquis.   3. 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 Supportive care. PT eval, increase  activity, follow with outpatient PCP and orthopedics for definitive management.   4. GERD. On PPI   5. Essential hypertension. Continue ARB at home dose.   6. Depression on Cymbalta continue   Discharge Condition: Fair  Follow UP  Follow-up Information    Follow up with [name], MD. Schedule an appointment as soon as possible for a visit in 1 week.   Specialty:  Internal Medicine   Contact information:   [place] [id number] [phone]        Consults obtained - stable  Diet and Activity recommendation: See Discharge Instructions below  Discharge Instructions       Discharge Instructions    Diet - low sodium heart healthy    Complete by:  As directed      Discharge instructions    Complete by:  As directed   Follow with Primary MD [name], MD in 7 days   Get CBC, CMP, 2 view Chest X ray checked  by Primary MD next visit.    Activity: As tolerated with Full fall precautions use walker/cane & assistance as needed   Disposition Home     Diet: Heart Healthy     For Heart failure patients - Check your Weight same time everyday, if you gain over 2 pounds, or you develop in leg swelling, experience more shortness of breath or chest pain, call your Primary MD immediately. Follow Cardiac Low Salt Diet and 1.5 lit/day fluid restriction.   On your next visit with your primary care physician please Get Medicines reviewed and adjusted.   Please request your Prim.MD to go over all Hospital Tests and Procedure/Radiological results at the follow up, please get all Hospital records sent to your Prim MD by signing hospital release before you go home.   If you experience worsening of your admission symptoms, develop shortness of breath, life threatening emergency, suicidal or homicidal thoughts you must seek medical attention immediately by calling 911 or calling your MD immediately  if symptoms less severe.  You Must read complete  instructions/literature along with all the possible adverse reactions/side effects for all the Medicines you take and that have been prescribed to you. Take any new Medicines after you have completely understood and accpet all the possible adverse reactions/side effects.   Do not drive, operating heavy machinery, perform activities at heights, swimming or participation in water activities or provide baby sitting services if your were admitted for syncope or siezures until you have seen by Primary MD or a Neurologist and advised to do so again.  Do not drive when taking Pain medications.    Do not take more than prescribed Pain, Sleep and Anxiety Medications  Special Instructions: If you have smoked or chewed Tobacco  in the last 2 yrs please stop smoking, stop any regular Alcohol  and or any Recreational drug use.  Wear Seat belts while driving.   Please note  You were cared for by a hospitalist during your hospital stay. If you have any questions about your discharge medications or the care you received while you were in the hospital after you are discharged, you can call the unit and asked to speak with the hospitalist on call if the hospitalist that took care of you is not available. Once you are discharged, your primary care physician will handle any further medical issues. Please note that NO REFILLS for any discharge medications will be authorized once you are discharged, as it is imperative that you return to your primary care physician (or establish a relationship with a primary care physician if you do not have one) for your aftercare needs so that they can reassess your need for medications and monitor your lab values.     Increase activity slowly    Complete by:  As directed              Discharge Medications       Medication List    STOP taking these medications        ciprofloxacin 500 MG tablet  Commonly known  as:  CIPRO     diclofenac 75 MG EC tablet  Commonly  known as:  VOLTAREN      TAKE these medications        acetaminophen 500 MG tablet  Commonly known as:  TYLENOL  Take 1,000 mg by mouth 3 (three) times daily as needed for moderate pain or headache.     albuterol 108 (90 BASE) MCG/ACT inhaler  Commonly known as:  PROVENTIL HFA;VENTOLIN HFA  Inhale 1-2 puffs into the lungs every 6 (six) hours as needed for wheezing or shortness of breath.     apixaban 5 MG Tabs tablet  Commonly known as:  ELIQUIS  Take 1 tablet (5 mg total) by mouth 2 (two) times daily.     budesonide 0.5 MG/2ML nebulizer solution  Commonly known as:  PULMICORT  Take 2 mLs (0.5 mg total) by nebulization 2 (two) times daily.     diltiazem 180 MG 24 hr capsule  Commonly known as:  CARDIZEM CD  Take 1 capsule (180 mg total) by mouth daily.     DULoxetine 60 MG capsule  Commonly known as:  CYMBALTA  Take 1 capsule by mouth daily with supper.     ibuprofen 600 MG tablet  Commonly known as:  ADVIL,MOTRIN  Take 1 tablet (600 mg total) by mouth every 12 (twelve) hours as needed for headache.     levalbuterol 0.63 MG/3ML nebulizer solution  Commonly known as:  XOPENEX  Take 0.63 mg by nebulization every 4 (four) hours as needed for wheezing or shortness of breath.     losartan 50 MG tablet  Commonly known as:  COZAAR  Take 50 mg by mouth daily.     NONFORMULARY OR COMPOUNDED ITEM  Resume outpatient water therapy as before to contraindications     oxyCODONE 15 MG immediate release tablet  Commonly known as:  ROXICODONE  Take 1 tablet (15 mg total) by mouth every 12 (twelve) hours as needed for pain. Patient takes 6 per day     pantoprazole 40 MG tablet  Commonly known as:  PROTONIX  TAKE 1 TABLET TWICE DAILY     RESTORA Caps  Take 1 capsule by mouth daily.        Major procedures and Radiology Reports - PLEASE review detailed and final reports for all details, in brief -       Dg Chest 2 View  09/29/2014   CLINICAL DATA:  Confusion. Recent  bariatric surgery. Confusion for several weeks since the surgery.  EXAM: CHEST  2 VIEW  COMPARISON:  07/03/2014  FINDINGS: Normal heart size and pulmonary vascularity. No focal airspace disease or consolidation in the lungs. No blunting of costophrenic angles. No pneumothorax. Mediastinal contours appear intact. Degenerative changes in the spine and shoulders. Probable degenerative cysts in the right humeral head. Calcification of the aorta.  IMPRESSION: No active cardiopulmonary disease.   Electronically Signed   By: Lucienne Capers M.D.   On: 09/29/2014 21:28   Ct Head Wo Contrast  09/29/2014   CLINICAL DATA:  Confusion.  No longer recognizes people or places.  EXAM: CT HEAD WITHOUT CONTRAST  TECHNIQUE: Contiguous axial images were obtained from the base of the skull through the vertex without intravenous contrast.  COMPARISON:  None.  FINDINGS: The brain shows generalized atrophy, somewhat advanced for age. No sign of old or acute focal infarction, mass lesion, hemorrhage, hydrocephalus or extra-axial collection. The calvarium is unremarkable. Sinuses, middle ears and mastoids are clear. There is atherosclerotic  calcification of the major vessels at the base of the brain.  IMPRESSION: Atrophy, advanced for age.  No focal or acute finding.   Electronically Signed   By: Nelson Chimes M.D.   On: 09/29/2014 20:51   Mr Brain Wo Contrast  10/13/2014   CLINICAL DATA:  Acute confusional state.  EXAM: MRI HEAD WITHOUT CONTRAST  TECHNIQUE: Multiplanar, multiecho pulse sequences of the brain and surrounding structures were obtained without intravenous contrast.  COMPARISON:  Head CT 09/29/2014  FINDINGS: Diffusion imaging does not show any acute or subacute infarction. There are are moderate chronic small-vessel ischemic changes affecting the pons. No focal cerebellar finding. The cerebral hemispheres show mild generalized atrophy with mild chronic small-vessel change of the white matter. No cortical or large vessel  territory infarctions. No mass lesion, hemorrhage, hydrocephalus or extra-axial collection. No pituitary mass. No inflammatory sinus disease. No skull or skullbase lesion. Major vessels at the base of the brain show flow.  IMPRESSION: No acute or reversible finding.  Chronic small-vessel ischemic changes of the pons and cerebral hemispheric white matter.   Electronically Signed   By: Nelson Chimes M.D.   On: 10/13/2014 18:32   Ct Abdomen Pelvis W Contrast  10/13/2014   CLINICAL DATA:  Left groin pain, constipation, and decreased oral intake. History of diverticulitis.  EXAM: CT ABDOMEN AND PELVIS WITH CONTRAST  TECHNIQUE: Multidetector CT imaging of the abdomen and pelvis was performed using the standard protocol following bolus administration of intravenous contrast.  CONTRAST:  183mL OMNIPAQUE IOHEXOL 300 MG/ML  SOLN  COMPARISON:  Abdominal radiographs earlier today. CT abdomen 11/24/2012. CT abdomen and pelvis 01/25/2007.  FINDINGS: Mosaic attenuation in the lung bases may reflect respiratory motion artifact and subsegmental atelectasis although a component of air trapping is also possible. There is no pleural effusion.  The diffusely decreased attenuation of the liver is compatible with mild steatosis. The gallbladder is surgically absent. Mild extrahepatic biliary dilatation is slightly more prominent than on the prior study but most likely related to prior cholecystectomy and measuring up to 1.2 cm in diameter. The spleen, left adrenal gland, and pancreas are unremarkable. Right adrenal calcification is unchanged and may reflect prior hemorrhage. Subcentimeter hypodense lesions in the kidneys are too small to fully characterize but most likely represent cysts.  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 Oral contrast is present in the stomach and in multiple nondilated loops of small bowel without evidence of obstruction. Diffuse colonic diverticulosis is noted without evidence of diverticulitis.  Ingested medication capsules are noted scattered throughout the colon and in the rectum. The appendix is not clearly identified, however no inflammatory changes are seen in the right lower quadrant. There is a small amount of stool throughout the colon and rectum.  The uterus is absent. The ovaries and bladder are unremarkable. Mild abdominal aortic atherosclerosis is noted. No free fluid or enlarged lymph nodes are identified. Severe degenerative changes are present involving both hips with complete joint space loss, subchondral sclerosis and cyst formation, and flattening of the femoral heads. Thoracolumbar spondylosis is noted.  IMPRESSION: 1. No acute abnormality identified in the abdomen or pelvis. 2. Hepatic steatosis. 3. Colonic diverticulosis. 4. Advanced bilateral hip osteoarthrosis.   Electronically Signed   By: Logan Bores   On: 10/13/2014 12:50   Dg Abd Acute W/chest  10/13/2014   CLINICAL DATA:  Altered mental status for 3 days which worsened last night. Initial encounter.  EXAM: DG ABDOMEN ACUTE W/ 1V CHEST  COMPARISON:  PA  and lateral chest 09/29/2014. CT abdomen 11/24/2012.  FINDINGS: Single view of the chest demonstrates clear lungs and normal heart size. No pneumothorax or pleural fluid.  Two views of the abdomen show no free intraperitoneal air. The bowel gas pattern is normal. No unexpected abdominal calcification is seen. There is convex right lumbar scoliosis and multilevel spondylosis. Advanced degenerative change is present about the hips.  IMPRESSION: No acute abnormality.  Advanced bilateral hip osteoarthritis. Convex right lumbar scoliosis and spondylosis also noted.   Electronically Signed   By: Inge Rise M.D.   On: 10/13/2014 08:46    Micro Results      Recent Results (from the past 240 hour(s))  Urine culture     Status: None (Preliminary result)   Collection Time: 10/13/14  9:12 AM  Result Value Ref Range Status   Specimen Description URINE, CLEAN CATCH  Final    Special Requests NONE  Final   Culture MULTIPLE SPECIES PRESENT, SUGGEST RECOLLECTION  Final   Report Status PENDING  Incomplete       Today   Subjective    Saba Gomm today has no headache,no chest abdominal pain,no new weakness tingling or numbness, feels much better wants to go home today.     Objective   Blood pressure 128/70, pulse 93, temperature 98.5 F (36.9 C), temperature source Oral, resp. rate 18, SpO2 96 %.   Intake/Output Summary (Last 24 hours) at 10/15/14 1036 Last data filed at 10/15/14 0238  Gross per 24 hour  Intake    240 ml  Output   1600 ml  Net  -1360 ml    Exam Awake Alert, Oriented x 3, No new F.N deficits, Normal affect [place].AT,PERRAL Supple Neck,No JVD, No cervical lymphadenopathy appriciated.  Symmetrical Chest wall movement, Good air movement bilaterally, CTAB RRR,No Gallops,Rubs or new Murmurs, No Parasternal Heave +ve B.Sounds, Abd Soft, Non tender, No organomegaly appriciated, No rebound -guarding or rigidity. No Cyanosis, Clubbing or edema, No new Rash or bruise   Data Review   CBC w Diff: Lab Results  Component Value Date   WBC 7.2 10/14/2014   HGB 8.9* 10/14/2014   HCT 28.0* 10/14/2014   PLT 157 10/14/2014   LYMPHOPCT 23 10/13/2014   MONOPCT 7 10/13/2014   EOSPCT 0 10/13/2014   BASOPCT 0 10/13/2014    CMP: Lab Results  Component Value Date   NA 139 10/14/2014   K 4.0 10/14/2014   CL 107 10/14/2014   CO2 24 10/14/2014   BUN 7 10/14/2014   CREATININE 0.55 10/14/2014   PROT 5.8* 10/14/2014   ALBUMIN 3.3* 10/14/2014   BILITOT 0.3 10/14/2014   ALKPHOS 79 10/14/2014   AST 17 10/14/2014   ALT 15 10/14/2014  .   Total Time in preparing paper work, data evaluation and todays exam - 35 minutes  Thurnell Lose M.D on 10/15/2014 at 10:36 AM  [facility]   Office  [phone]

## 2014-10-16 DIAGNOSIS — M25551 Pain in right hip: Secondary | ICD-10-CM | POA: Diagnosis not present

## 2014-10-16 DIAGNOSIS — M25552 Pain in left hip: Secondary | ICD-10-CM | POA: Diagnosis not present

## 2014-10-17 ENCOUNTER — Encounter: Payer: Medicare Other | Attending: Surgery | Admitting: Dietician

## 2014-10-17 ENCOUNTER — Encounter: Payer: Self-pay | Admitting: Dietician

## 2014-10-17 DIAGNOSIS — Z713 Dietary counseling and surveillance: Secondary | ICD-10-CM | POA: Insufficient documentation

## 2014-10-17 DIAGNOSIS — Z6841 Body Mass Index (BMI) 40.0 and over, adult: Secondary | ICD-10-CM | POA: Diagnosis not present

## 2014-10-17 NOTE — Patient Instructions (Addendum)
Goals: Follow Phase 3B: High Protein + Non-Starchy Vegetables Eat 3-6 small meals/snacks, every 3-5 hrs Increase lean protein foods to meet 60g goal Increase fluid intake to 64oz + Avoid drinking 15 minutes before, during and 30 minutes after eating Aim for >30 min of physical activity daily Make sure to drink one protein shake per day  Try protein like [place] sausage, try cottage cheese with salsa, chicken/egg salad, cheese/lunch meat roll up, [employer] protein chips, white beans  Eat protein first and then vegetables

## 2014-10-17 NOTE — Progress Notes (Signed)
  Follow-up visit:  8 Weeks Post-Operative Sleeve Gastrectomy Surgery  Medical Nutrition Therapy:  Appt start time: 1500 end time: 8182  .  Primary concerns today: Post-operative Bariatric Surgery Nutrition Management. Returns with a 18.8 lbs since last visit. Was admitted to hospital on Sunday for dehydration and confusion. No longer taking Oxycodone for pain. Still in a lot of pain today.   "Never feeling hungry" and struggles with breakfast. Not tolerating eggs. Not getting in fluid requirements and not getting in protein requirements most days. Is a self described "picky eater".   Surgery date: 08/20/14 Surgery type: Gastric sleeve [facility] weight at [facility]: 278 lbs on 06/27/14 Weight today: 242.8 lbs  Weight change: 18.8 lbs Total weight loss: 35.2 lbs  [place]  BODY COMP RESULTS  09/03/14   BMI (kg/m^2) no   Fat Mass (lbs) [place]   Fat Free Mass (lbs) done   Total Body Water (lbs) today     Preferred Learning Style:   No preference indicated   Learning Readiness:   Ready  24-hr recall: B (AM): egg or [place] chocolate with milk (6-29 g) Snk (AM): cheese stick (6 g) L (PM): cream of chicken soup or 1.5-2 oz hamburger or chicken (6-14 g) Snk (PM): 1/2 shake - whole shake or cheese stick or SF popsicle (6-29 g) D (PM): 1-2 oz chicken or beef or vegetables or white beans with ham and tomatoes (0-14 g) Snk (PM): none  Fluid intake: 32 oz water with flavoring or decaf unsweet tea with sweet and low and sometimes an 8 oz protein shake (32 oz)  Estimated total protein intake: 24-92 g  Medications: see list  Supplementation: not taking starting this week (since hospital)  Using straws: Yes Drinking while eating: Yes sips Hair loss: No  Carbonated beverages: No N/V/D/C: nausea mostly since the hospital and will start anti-nausea pill, constipation has been better since coming home from the hospital  Dumping syndrome: No  Recent physical activity:  Water aerobics 3 x week for 60  minutes does not move much d/t pain hip  Progress Towards Goal(s):  In progress.  Handouts given during visit include:  Phase 3B High Protein + Non Starchy Vegetables   Nutritional Diagnosis:  [place]-3.3 Overweight/obesity related to past poor dietary habits and physical inactivity as evidenced by patient w/ recent sleeve gastrectomy surgery following dietary guidelines for continued weight loss.    Intervention:  Nutrition education/diet advancement. Goals: Follow Phase 3B: High Protein + Non-Starchy Vegetables Eat 3-6 small meals/snacks, every 3-5 hrs Increase lean protein foods to meet 60g goal Increase fluid intake to 64oz + Avoid drinking 15 minutes before, during and 30 minutes after eating Aim for >30 min of physical activity daily Make sure to drink one protein shake per day  Try protein like [place] sausage, try cottage cheese with salsa, chicken/egg salad, cheese/lunch meat roll up, [employer] protein chips, white beans  Eat protein first and then vegetables  Teaching Method Utilized:  Visual Auditory Hands on  Barriers to learning/adherence to lifestyle change: limited mobility d/t hip pain  Demonstrated degree of understanding via:  Teach Back   Monitoring/Evaluation:  Dietary intake, exercise, and body weight. Follow up in 6 weeks 4.5 month post-op visit.

## 2014-10-18 DIAGNOSIS — M25552 Pain in left hip: Secondary | ICD-10-CM | POA: Diagnosis not present

## 2014-10-18 DIAGNOSIS — M25551 Pain in right hip: Secondary | ICD-10-CM | POA: Diagnosis not present

## 2014-10-21 DIAGNOSIS — G894 Chronic pain syndrome: Secondary | ICD-10-CM | POA: Diagnosis not present

## 2014-10-21 DIAGNOSIS — Z79899 Other long term (current) drug therapy: Secondary | ICD-10-CM | POA: Diagnosis not present

## 2014-10-22 DIAGNOSIS — M25552 Pain in left hip: Secondary | ICD-10-CM | POA: Diagnosis not present

## 2014-10-22 DIAGNOSIS — M25551 Pain in right hip: Secondary | ICD-10-CM | POA: Diagnosis not present

## 2014-10-24 DIAGNOSIS — M25552 Pain in left hip: Secondary | ICD-10-CM | POA: Diagnosis not present

## 2014-10-24 DIAGNOSIS — M25551 Pain in right hip: Secondary | ICD-10-CM | POA: Diagnosis not present

## 2014-10-28 DIAGNOSIS — M1611 Unilateral primary osteoarthritis, right hip: Secondary | ICD-10-CM | POA: Diagnosis not present

## 2014-10-28 DIAGNOSIS — M1612 Unilateral primary osteoarthritis, left hip: Secondary | ICD-10-CM | POA: Diagnosis not present

## 2014-10-28 DIAGNOSIS — M25552 Pain in left hip: Secondary | ICD-10-CM | POA: Diagnosis not present

## 2014-10-28 DIAGNOSIS — M25551 Pain in right hip: Secondary | ICD-10-CM | POA: Diagnosis not present

## 2014-10-30 ENCOUNTER — Telehealth: Payer: Self-pay | Admitting: Cardiovascular Disease

## 2014-10-30 NOTE — Telephone Encounter (Signed)
Spoke w/ caller. Verified that fax was received from Dr. Aurea Graff office.  Informed her Dr. Gwenlyn Found will return to office tomorrow, will have this signed then.   Asked pt if she needs f/u phone call from [place] once signed - she states no. No further questions, all concerns addressed on phone.  Routing to [place] for review.

## 2014-10-30 NOTE — Telephone Encounter (Signed)
Pt says she needs her clarence for hip replacement sent back to her surgeon asap please.They said they sent it over yesterday.

## 2014-10-31 NOTE — Telephone Encounter (Signed)
Requesting surgical clearance:   1. Type of surgery: left hip: THA w/wo autograft/allograft  2. Surgeon: Dr Paralee Cancel  3. Surgical date: 12/10/14  4. Medications that need to be held: Eliquis  5. CAD:NO     6. I will defer to: Dr Quay Burow

## 2014-10-31 NOTE — Telephone Encounter (Signed)
[name], please advise length to hold med.

## 2014-10-31 NOTE — Telephone Encounter (Signed)
Hold Eliquis for 2 full days prior to procedure.

## 2014-10-31 NOTE — Telephone Encounter (Signed)
Cleared for hip surgery at low cardiovascular risk. Okay to stop her oral anticoagulation for the surgery.

## 2014-11-01 NOTE — Telephone Encounter (Signed)
Encounter routed to [facility]

## 2014-11-01 NOTE — Telephone Encounter (Signed)
Left message for patient so that I could update her on her surgical clearance and to hold her Eliquis

## 2014-11-04 NOTE — Patient Instructions (Addendum)
YOUR PROCEDURE IS SCHEDULED ON : 11/12/14  REPORT TO [facility] MAIN ENTRANCE FOLLOW SIGNS TO EAST ELEVATOR - GO TO 3rd FLOOR CHECK IN AT 3 EAST NURSES STATION (SHORT STAY) AT:  11:15 AM  CALL THIS NUMBER IF YOU HAVE PROBLEMS THE MORNING OF SURGERY [phone]  REMEMBER:ONLY 1 PER PERSON MAY GO TO SHORT STAY WITH YOU TO GET READY THE MORNING OF YOUR SURGERY  DO NOT EAT FOOD  AFTER MIDNIGHT   MAY HAVE CLEAR LIQUIDS UNTIL 8:15 AM  TAKE THESE MEDICINES THE MORNING OF SURGERY: CARTIA / PANTOPRAZOLE / CLONIDINE / MAY TAKE CLONAZEPAM IF NEEDED / MAY TAKE TRAMADOL IF NEEDED / MAY USE INHALERS IF NEEDED  CLEAR LIQUID DIET  Foods Allowed                                                                     Foods Excluded  Coffee and tea, regular and decaf                             liquids that you cannot  Plain Jell-O in any flavor                                             see through such as: Fruit ices (not with fruit pulp)                                     milk, soups, orange juice  Iced Popsicles                                                 All solid food Carbonated beverages, regular and diet                                    Cranberry, grape and apple juices Sports drinks like Gatorade Lightly seasoned clear broth or consume(fat free) Sugar, honey syrup _____________________________________________________________________  YOU MAY NOT HAVE ANY METAL ON YOUR BODY INCLUDING HAIR PINS AND PIERCING'S. DO NOT WEAR JEWELRY, MAKEUP, LOTIONS, POWDERS OR PERFUMES. DO NOT WEAR NAIL POLISH. DO NOT SHAVE 48 HRS PRIOR TO SURGERY. MEN MAY SHAVE FACE AND NECK.  DO NOT [place]. [facility] IS NOT RESPONSIBLE FOR VALUABLES.  CONTACTS, DENTURES OR PARTIALS MAY NOT BE WORN TO SURGERY. LEAVE SUITCASE IN CAR. CAN BE BROUGHT TO ROOM AFTER SURGERY.  PATIENTS DISCHARGED THE DAY OF SURGERY WILL NOT BE ALLOWED TO DRIVE HOME.  PLEASE READ OVER THE FOLLOWING  INSTRUCTION SHEETS _________________________________________________________________________________                                          [name] - PREPARING  FOR SURGERY  Before surgery, you can play an important role.  Because skin is not sterile, your skin needs to be as free of germs as possible.  You can reduce the number of germs on your skin by washing with CHG (chlorahexidine gluconate) soap before surgery.  CHG is an antiseptic cleaner which kills germs and bonds with the skin to continue killing germs even after washing. Please DO NOT use if you have an allergy to CHG or antibacterial soaps.  If your skin becomes reddened/irritated stop using the CHG and inform your nurse when you arrive at Short Stay. Do not shave (including legs and underarms) for at least 48 hours prior to the first CHG shower.  You may shave your face. Please follow these instructions carefully:   1.  Shower with CHG Soap the night before surgery and the  morning of Surgery.   2.  If you choose to wash your hair, wash your hair first as usual with your  normal  Shampoo.   3.  After you shampoo, rinse your hair and body thoroughly to remove the  shampoo.                                         4.  Use CHG as you would any other liquid soap.  You can apply chg directly  to the skin and wash . Gently wash with scrungie or clean wascloth    5.  Apply the CHG Soap to your body ONLY FROM THE NECK DOWN.   Do not use on open                           Wound or open sores. Avoid contact with eyes, ears mouth and genitals (private parts).                        Genitals (private parts) with your normal soap.              6.  Wash thoroughly, paying special attention to the area where your surgery  will be performed.   7.  Thoroughly rinse your body with warm water from the neck down.   8.  DO NOT shower/wash with your normal soap after using and rinsing off  the CHG Soap .                9.  Pat yourself dry  with a clean towel.             10.  Wear clean night clothes to bed after shower             11.  Place clean sheets on your bed the night of your first shower and do not  sleep with pets.  Day of Surgery : Do not apply any lotions/deodorants the morning of surgery.  Please wear clean clothes to the hospital/surgery center.  FAILURE TO FOLLOW THESE INSTRUCTIONS MAY RESULT IN THE CANCELLATION OF YOUR SURGERY    PATIENT SIGNATURE_________________________________  ______________________________________________________________________     [name]  An incentive spirometer is a tool that can help keep your lungs clear and active. This tool measures how well you are filling your lungs with each breath. Taking long deep breaths may help reverse or decrease the chance of developing breathing (pulmonary) problems (especially infection) following:  A long period of time when you are unable to move or be active. BEFORE THE PROCEDURE   If the spirometer includes an indicator to show your best effort, your nurse or respiratory therapist will set it to a desired goal.  If possible, sit up straight or lean slightly forward. Try not to slouch.  Hold the incentive spirometer in an upright position. INSTRUCTIONS FOR USE   Sit on the edge of your bed if possible, or sit up as far as you can in bed or on a chair.  Hold the incentive spirometer in an upright position.  Breathe out normally.  Place the mouthpiece in your mouth and seal your lips tightly around it.  Breathe in slowly and as deeply as possible, raising the piston or the ball toward the top of the column.  Hold your breath for 3-5 seconds or for as long as possible. Allow the piston or ball to fall to the bottom of the column.  Remove the mouthpiece from your mouth and breathe out normally.  Rest for a few seconds and repeat Steps 1 through 7 at least 10 times every 1-2 hours when you are awake. Take your time and  take a few normal breaths between deep breaths.  The spirometer may include an indicator to show your best effort. Use the indicator as a goal to work toward during each repetition.  After each set of 10 deep breaths, practice coughing to be sure your lungs are clear. If you have an incision (the cut made at the time of surgery), support your incision when coughing by placing a pillow or rolled up towels firmly against it. Once you are able to get out of bed, walk around indoors and cough well. You may stop using the incentive spirometer when instructed by your caregiver.  RISKS AND COMPLICATIONS  Take your time so you do not get dizzy or light-headed.  If you are in pain, you may need to take or ask for pain medication before doing incentive spirometry. It is harder to take a deep breath if you are having pain. AFTER USE  Rest and breathe slowly and easily.  It can be helpful to keep track of a log of your progress. Your caregiver can provide you with a simple table to help with this. If you are using the spirometer at home, follow these instructions: [place] IF:   You are having difficultly using the spirometer.  You have trouble using the spirometer as often as instructed.  Your pain medication is not giving enough relief while using the spirometer.  You develop fever of 100.5 F (38.1 C) or higher. SEEK IMMEDIATE MEDICAL CARE IF:   You cough up bloody sputum that had not been present before.  You develop fever of 102 F (38.9 C) or greater.  You develop worsening pain at or near the incision site. MAKE SURE YOU:   Understand these instructions.  Will watch your condition.  Will get help right away if you are not doing well or get worse. Document Released: 07/19/2006 Document Revised: 05/31/2011 Document Reviewed: 09/19/2006 Exit[employer] Patient Information 2014 [employer], [place].   ________________________________________________________________________  WHAT  IS A BLOOD TRANSFUSION? Blood Transfusion Information  A transfusion is the replacement of blood or some of its parts. Blood is made up of multiple cells which provide different functions.  Red blood cells carry oxygen and are used for blood loss replacement.  White blood cells fight against infection.  Platelets control bleeding.  Plasma helps clot blood.  Other blood products are available for specialized needs, such as hemophilia or other clotting disorders. BEFORE THE TRANSFUSION  Who gives blood for transfusions?   Healthy volunteers who are fully evaluated to make sure their blood is safe. This is blood bank blood. Transfusion therapy is the safest it has ever been in the practice of medicine. Before blood is taken from a donor, a complete history is taken to make sure that person has no history of diseases nor engages in risky social behavior (examples are intravenous drug use or sexual activity with multiple partners). The donor's travel history is screened to minimize risk of transmitting infections, such as malaria. The donated blood is tested for signs of infectious diseases, such as HIV and hepatitis. The blood is then tested to be sure it is compatible with you in order to minimize the chance of a transfusion reaction. If you or a relative donates blood, this is often done in anticipation of surgery and is not appropriate for emergency situations. It takes many days to process the donated blood. RISKS AND COMPLICATIONS Although transfusion therapy is very safe and saves many lives, the main dangers of transfusion include:   Getting an infectious disease.  Developing a transfusion reaction. This is an allergic reaction to something in the blood you were given. Every precaution is taken to prevent this. The decision to have a blood transfusion has been considered carefully by your caregiver before blood is given. Blood is not given unless the benefits outweigh the risks. AFTER THE  TRANSFUSION  Right after receiving a blood transfusion, you will usually feel much better and more energetic. This is especially true if your red blood cells have gotten low (anemic). The transfusion raises the level of the red blood cells which carry oxygen, and this usually causes an energy increase.  The nurse administering the transfusion will monitor you carefully for complications. HOME CARE INSTRUCTIONS  No special instructions are needed after a transfusion. You may find your energy is better. Speak with your caregiver about any limitations on activity for underlying diseases you may have. SEEK MEDICAL CARE IF:   Your condition is not improving after your transfusion.  You develop redness or irritation at the intravenous (IV) site. SEEK IMMEDIATE MEDICAL CARE IF:  Any of the following symptoms occur over the next 12 hours:  Shaking chills.  You have a temperature by mouth above 102 F (38.9 C), not controlled by medicine.  Chest, back, or muscle pain.  People around you feel you are not acting correctly or are confused.  Shortness of breath or difficulty breathing.  Dizziness and fainting.  You get a rash or develop hives.  You have a decrease in urine output.  Your urine turns a dark color or changes to pink, red, or brown. Any of the following symptoms occur over the next 10 days:  You have a temperature by mouth above 102 F (38.9 C), not controlled by medicine.  Shortness of breath.  Weakness after normal activity.  The white part of the eye turns yellow (jaundice).  You have a decrease in the amount of urine or are urinating less often.  Your urine turns a dark color or changes to pink, red, or brown. Document Released: 03/05/2000 Document Revised: 05/31/2011 Document Reviewed: 10/23/2007 [facility] Patient Information 2014 [place], [place].  _______________________________________________________________________

## 2014-11-06 ENCOUNTER — Encounter (HOSPITAL_COMMUNITY): Payer: Self-pay

## 2014-11-06 ENCOUNTER — Encounter (HOSPITAL_COMMUNITY)
Admission: RE | Admit: 2014-11-06 | Discharge: 2014-11-06 | Disposition: A | Discharge status: Home / Self Care | Payer: Medicare Other | Source: Ambulatory Visit | Attending: Orthopedic Surgery | Admitting: Orthopedic Surgery

## 2014-11-06 DIAGNOSIS — Z01818 Encounter for other preprocedural examination: Secondary | ICD-10-CM | POA: Insufficient documentation

## 2014-11-06 DIAGNOSIS — M160 Bilateral primary osteoarthritis of hip: Secondary | ICD-10-CM

## 2014-11-06 DIAGNOSIS — M1612 Unilateral primary osteoarthritis, left hip: Secondary | ICD-10-CM | POA: Diagnosis not present

## 2014-11-06 DIAGNOSIS — I480 Paroxysmal atrial fibrillation: Secondary | ICD-10-CM

## 2014-11-06 DIAGNOSIS — Z01812 Encounter for preprocedural laboratory examination: Secondary | ICD-10-CM | POA: Diagnosis not present

## 2014-11-06 DIAGNOSIS — Z0183 Encounter for blood typing: Secondary | ICD-10-CM | POA: Diagnosis not present

## 2014-11-06 HISTORY — DX: Major depressive disorder, single episode, unspecified: F32.9

## 2014-11-06 HISTORY — DX: Depression, unspecified: F32.A

## 2014-11-06 HISTORY — DX: Personal history of other medical treatment: Z92.89

## 2014-11-06 LAB — PROTIME-INR
INR: 1.39 (ref 0.00–1.49)
Prothrombin Time: 17.2 seconds — ABNORMAL HIGH (ref 11.6–15.2)

## 2014-11-06 LAB — BASIC METABOLIC PANEL
Anion gap: 12 (ref 5–15)
BUN: 15 mg/dL (ref 6–20)
CO2: 23 mmol/L (ref 22–32)
Calcium: 9.5 mg/dL (ref 8.9–10.3)
Chloride: 103 mmol/L (ref 101–111)
Creatinine, Ser: 0.95 mg/dL (ref 0.44–1.00)
GFR calc Af Amer: >60 mL/min (ref >60)
GFR calc non Af Amer: >60 mL/min (ref >60)
GLUCOSE: 118 mg/dL — AB (ref 65–99)
POTASSIUM: 3.2 mmol/L — AB (ref 3.5–5.1)
Sodium: 138 mmol/L (ref 135–145)

## 2014-11-06 LAB — CBC
HEMATOCRIT: 38.2 % (ref 36.0–46.0)
Hemoglobin: 11.7 g/dL — ABNORMAL LOW (ref 12.0–15.0)
MCH: 24.7 pg — ABNORMAL LOW (ref 26.0–34.0)
MCHC: 30.6 g/dL (ref 30.0–36.0)
MCV: 80.6 fL (ref 78.0–100.0)
Platelets: 225 10*3/uL (ref 150–400)
RBC: 4.74 MIL/uL (ref 3.87–5.11)
RDW: 17.8 % — AB (ref 11.5–15.5)
WBC: 6.3 10*3/uL (ref 4.0–10.5)

## 2014-11-06 LAB — SURGICAL PCR SCREEN
MRSA, PCR: NEGATIVE
STAPHYLOCOCCUS AUREUS: NEGATIVE

## 2014-11-06 LAB — APTT: aPTT: 32 seconds (ref 24–37)

## 2014-11-06 LAB — ABO/RH: ABO/RH(D): A POS

## 2014-11-07 ENCOUNTER — Other Ambulatory Visit (HOSPITAL_COMMUNITY)
Admission: RE | Admit: 2014-11-07 | Discharge: 2014-11-07 | Disposition: A | Discharge status: Home / Self Care | Payer: Medicare Other | Source: Ambulatory Visit | Attending: Orthopedic Surgery | Admitting: Orthopedic Surgery

## 2014-11-07 DIAGNOSIS — Z0183 Encounter for blood typing: Secondary | ICD-10-CM | POA: Diagnosis not present

## 2014-11-07 DIAGNOSIS — Z01812 Encounter for preprocedural laboratory examination: Secondary | ICD-10-CM | POA: Diagnosis not present

## 2014-11-07 DIAGNOSIS — M1612 Unilateral primary osteoarthritis, left hip: Secondary | ICD-10-CM | POA: Insufficient documentation

## 2014-11-07 LAB — URINE MICROSCOPIC-ADD ON

## 2014-11-07 LAB — URINALYSIS, ROUTINE W REFLEX MICROSCOPIC
Bilirubin Urine: NEGATIVE
Glucose, UA: NEGATIVE mg/dL
Hgb urine dipstick: NEGATIVE
Ketones, ur: NEGATIVE mg/dL
NITRITE: NEGATIVE
PH: 6 (ref 5.0–8.0)
Protein, ur: NEGATIVE mg/dL
Specific Gravity, Urine: 1.016 (ref 1.005–1.030)
UROBILINOGEN UA: 0.2 mg/dL (ref 0.0–1.0)

## 2014-11-07 NOTE — Progress Notes (Signed)
Abnormal UA faxed to Dr.Olin 

## 2014-11-08 NOTE — Progress Notes (Signed)
PATIENT CALLED AND INSTRUCTED TO ARRIVE AT [facility] SHORT STAY ON Tuesday 11/12/2014 AT 830 AM AND NOTHING TO EAT OR DRINK AFTER MIDNIGHT THE NIGHT BEFORE. TAKE MEDICATIONS AS INSTRUCTED MORNING OF SURGERY WITH JUST A SIP OF WATER. PATIENT VERBALIZED UNDERSTANDING.

## 2014-11-11 NOTE — Telephone Encounter (Signed)
I spoke with patient and verified that she was holding her Eliquis.

## 2014-11-12 ENCOUNTER — Inpatient Hospital Stay (HOSPITAL_COMMUNITY): Payer: Medicare Other | Admitting: Anesthesiology

## 2014-11-12 ENCOUNTER — Inpatient Hospital Stay (HOSPITAL_COMMUNITY): Payer: Medicare Other

## 2014-11-12 ENCOUNTER — Inpatient Hospital Stay (HOSPITAL_COMMUNITY)
Admission: RE | Admit: 2014-11-12 | Discharge: 2014-11-15 | DRG: 470 | Disposition: A | Discharge status: Skilled Nursing Facility | Payer: Medicare Other | Source: Ambulatory Visit | Attending: Orthopedic Surgery | Admitting: Orthopedic Surgery

## 2014-11-12 ENCOUNTER — Encounter (HOSPITAL_COMMUNITY)
Admission: RE | Disposition: A | Discharge status: Skilled Nursing Facility | Payer: Self-pay | Source: Ambulatory Visit | Attending: Orthopedic Surgery

## 2014-11-12 ENCOUNTER — Encounter (HOSPITAL_COMMUNITY): Payer: Self-pay | Admitting: *Deleted

## 2014-11-12 DIAGNOSIS — Z79899 Other long term (current) drug therapy: Secondary | ICD-10-CM

## 2014-11-12 DIAGNOSIS — M1612 Unilateral primary osteoarthritis, left hip: Secondary | ICD-10-CM | POA: Diagnosis present

## 2014-11-12 DIAGNOSIS — M25552 Pain in left hip: Secondary | ICD-10-CM

## 2014-11-12 DIAGNOSIS — G4730 Sleep apnea, unspecified: Secondary | ICD-10-CM | POA: Diagnosis present

## 2014-11-12 DIAGNOSIS — M25559 Pain in unspecified hip: Secondary | ICD-10-CM | POA: Diagnosis not present

## 2014-11-12 DIAGNOSIS — E669 Obesity, unspecified: Secondary | ICD-10-CM | POA: Diagnosis present

## 2014-11-12 DIAGNOSIS — R2681 Unsteadiness on feet: Secondary | ICD-10-CM | POA: Diagnosis not present

## 2014-11-12 DIAGNOSIS — D62 Acute posthemorrhagic anemia: Secondary | ICD-10-CM | POA: Diagnosis not present

## 2014-11-12 DIAGNOSIS — J45909 Unspecified asthma, uncomplicated: Secondary | ICD-10-CM | POA: Diagnosis present

## 2014-11-12 DIAGNOSIS — F419 Anxiety disorder, unspecified: Secondary | ICD-10-CM | POA: Diagnosis present

## 2014-11-12 DIAGNOSIS — M1611 Unilateral primary osteoarthritis, right hip: Secondary | ICD-10-CM | POA: Diagnosis not present

## 2014-11-12 DIAGNOSIS — Z471 Aftercare following joint replacement surgery: Secondary | ICD-10-CM | POA: Diagnosis not present

## 2014-11-12 DIAGNOSIS — Z6839 Body mass index (BMI) 39.0-39.9, adult: Secondary | ICD-10-CM

## 2014-11-12 DIAGNOSIS — I10 Essential (primary) hypertension: Secondary | ICD-10-CM | POA: Diagnosis present

## 2014-11-12 DIAGNOSIS — Z01812 Encounter for preprocedural laboratory examination: Secondary | ICD-10-CM | POA: Diagnosis not present

## 2014-11-12 DIAGNOSIS — F329 Major depressive disorder, single episode, unspecified: Secondary | ICD-10-CM | POA: Diagnosis not present

## 2014-11-12 DIAGNOSIS — I480 Paroxysmal atrial fibrillation: Secondary | ICD-10-CM | POA: Diagnosis present

## 2014-11-12 DIAGNOSIS — K219 Gastro-esophageal reflux disease without esophagitis: Secondary | ICD-10-CM | POA: Diagnosis present

## 2014-11-12 DIAGNOSIS — M25551 Pain in right hip: Secondary | ICD-10-CM

## 2014-11-12 DIAGNOSIS — Z96642 Presence of left artificial hip joint: Secondary | ICD-10-CM | POA: Diagnosis not present

## 2014-11-12 DIAGNOSIS — R278 Other lack of coordination: Secondary | ICD-10-CM | POA: Diagnosis not present

## 2014-11-12 DIAGNOSIS — M160 Bilateral primary osteoarthritis of hip: Secondary | ICD-10-CM | POA: Diagnosis not present

## 2014-11-12 DIAGNOSIS — Z96641 Presence of right artificial hip joint: Secondary | ICD-10-CM | POA: Diagnosis not present

## 2014-11-12 DIAGNOSIS — M6281 Muscle weakness (generalized): Secondary | ICD-10-CM | POA: Diagnosis not present

## 2014-11-12 DIAGNOSIS — R000 Tachycardia, unspecified: Secondary | ICD-10-CM | POA: Diagnosis not present

## 2014-11-12 DIAGNOSIS — Z96649 Presence of unspecified artificial hip joint: Secondary | ICD-10-CM

## 2014-11-12 DIAGNOSIS — Z8249 Family history of ischemic heart disease and other diseases of the circulatory system: Secondary | ICD-10-CM | POA: Diagnosis not present

## 2014-11-12 DIAGNOSIS — Z96653 Presence of artificial knee joint, bilateral: Secondary | ICD-10-CM | POA: Diagnosis present

## 2014-11-12 HISTORY — PX: BILATERAL ANTERIOR TOTAL HIP ARTHROPLASTY: SHX5567

## 2014-11-12 LAB — PROTIME-INR
INR: 1 (ref 0.00–1.49)
Prothrombin Time: 13.4 seconds (ref 11.6–15.2)

## 2014-11-12 LAB — POTASSIUM: Potassium: 3.7 mmol/L (ref 3.5–5.1)

## 2014-11-12 SURGERY — ARTHROPLASTY, HIP, BILATERAL, TOTAL, ANTERIOR APPROACH
Anesthesia: General | Site: Hip | Laterality: Bilateral

## 2014-11-12 MED ORDER — TRANEXAMIC ACID 1000 MG/10ML IV SOLN
1000.0000 mg | INTRAVENOUS | Status: AC
  Administered 2014-11-12: 1000 mg via INTRAVENOUS
  Filled 2014-11-12: qty 10

## 2014-11-12 MED ORDER — MENTHOL 3 MG MT LOZG: 1.0000 | LOZENGE | OROMUCOSAL | Status: DC | PRN

## 2014-11-12 MED ORDER — SUCCINYLCHOLINE CHLORIDE 20 MG/ML IJ SOLN
INTRAMUSCULAR | Status: DC | PRN
  Administered 2014-11-12: 100 mg via INTRAVENOUS

## 2014-11-12 MED ORDER — METHOCARBAMOL 1000 MG/10ML IJ SOLN
500.0000 mg | Freq: Four times a day (QID) | INTRAVENOUS | Status: DC | PRN
  Administered 2014-11-12: 500 mg via INTRAVENOUS
  Filled 2014-11-12 (×3): qty 5

## 2014-11-12 MED ORDER — PANTOPRAZOLE SODIUM 40 MG PO TBEC
40.0000 mg | DELAYED_RELEASE_TABLET | Freq: Two times a day (BID) | ORAL | Status: DC
  Administered 2014-11-12 – 2014-11-15 (×6): 40 mg via ORAL
  Filled 2014-11-12 (×9): qty 1

## 2014-11-12 MED ORDER — HYDROMORPHONE HCL 2 MG/ML IJ SOLN
INTRAMUSCULAR | Status: AC
  Filled 2014-11-12: qty 1

## 2014-11-12 MED ORDER — DILTIAZEM HCL ER COATED BEADS 180 MG PO CP24
180.0000 mg | ORAL_CAPSULE | Freq: Every day | ORAL | Status: DC
  Administered 2014-11-12 – 2014-11-15 (×4): 180 mg via ORAL
  Filled 2014-11-12 (×4): qty 1

## 2014-11-12 MED ORDER — ACETAMINOPHEN 10 MG/ML IV SOLN
1000.0000 mg | Freq: Once | INTRAVENOUS | Status: AC
  Administered 2014-11-12: 1000 mg via INTRAVENOUS

## 2014-11-12 MED ORDER — PROPOFOL 10 MG/ML IV BOLUS
INTRAVENOUS | Status: DC | PRN
  Administered 2014-11-12: 200 mg via INTRAVENOUS

## 2014-11-12 MED ORDER — ALUM & MAG HYDROXIDE-SIMETH 200-200-20 MG/5ML PO SUSP: 30.0000 mL | ORAL | Status: DC | PRN

## 2014-11-12 MED ORDER — CLORAZEPATE DIPOTASSIUM 7.5 MG PO TABS
7.5000 mg | ORAL_TABLET | Freq: Three times a day (TID) | ORAL | Status: DC | PRN
  Administered 2014-11-13 – 2014-11-14 (×4): 7.5 mg via ORAL
  Filled 2014-11-12 (×6): qty 1

## 2014-11-12 MED ORDER — HYDROCODONE-ACETAMINOPHEN 7.5-325 MG PO TABS
1.0000 | ORAL_TABLET | ORAL | Status: DC
  Administered 2014-11-12 (×2): 1 via ORAL
  Administered 2014-11-13 (×2): 2 via ORAL
  Filled 2014-11-12 (×2): qty 2
  Filled 2014-11-12: qty 1
  Filled 2014-11-12: qty 2

## 2014-11-12 MED ORDER — CEFAZOLIN SODIUM-DEXTROSE 2-3 GM-% IV SOLR
2.0000 g | INTRAVENOUS | Status: AC
  Administered 2014-11-12: 2 g via INTRAVENOUS

## 2014-11-12 MED ORDER — FENTANYL CITRATE (PF) 100 MCG/2ML IJ SOLN
INTRAMUSCULAR | Status: DC | PRN
  Administered 2014-11-12 (×3): 100 ug via INTRAVENOUS
  Administered 2014-11-12: 50 ug via INTRAVENOUS
  Administered 2014-11-12: 100 ug via INTRAVENOUS

## 2014-11-12 MED ORDER — NEOSTIGMINE METHYLSULFATE 10 MG/10ML IV SOLN
INTRAVENOUS | Status: DC | PRN
  Administered 2014-11-12: 5 mg via INTRAVENOUS

## 2014-11-12 MED ORDER — FENTANYL CITRATE (PF) 100 MCG/2ML IJ SOLN
25.0000 ug | INTRAMUSCULAR | Status: DC | PRN
  Administered 2014-11-12 (×2): 25 ug via INTRAVENOUS

## 2014-11-12 MED ORDER — DIPHENHYDRAMINE HCL 25 MG PO CAPS: 25.0000 mg | ORAL_CAPSULE | Freq: Four times a day (QID) | ORAL | Status: DC | PRN

## 2014-11-12 MED ORDER — LACTATED RINGERS IV SOLN: INTRAVENOUS | Status: DC

## 2014-11-12 MED ORDER — CLONIDINE HCL 0.1 MG PO TABS
0.1000 mg | ORAL_TABLET | Freq: Three times a day (TID) | ORAL | Status: DC
  Administered 2014-11-12 – 2014-11-15 (×5): 0.1 mg via ORAL
  Filled 2014-11-12 (×10): qty 1

## 2014-11-12 MED ORDER — FENTANYL CITRATE (PF) 250 MCG/5ML IJ SOLN
INTRAMUSCULAR | Status: AC
  Filled 2014-11-12: qty 25

## 2014-11-12 MED ORDER — ALBUTEROL SULFATE (2.5 MG/3ML) 0.083% IN NEBU: 2.5000 mg | INHALATION_SOLUTION | Freq: Four times a day (QID) | RESPIRATORY_TRACT | Status: DC | PRN

## 2014-11-12 MED ORDER — CELECOXIB 200 MG PO CAPS
200.0000 mg | ORAL_CAPSULE | Freq: Two times a day (BID) | ORAL | Status: DC
  Administered 2014-11-12 – 2014-11-15 (×6): 200 mg via ORAL
  Filled 2014-11-12 (×9): qty 1

## 2014-11-12 MED ORDER — HYDROMORPHONE HCL 1 MG/ML IJ SOLN
INTRAMUSCULAR | Status: DC | PRN
  Administered 2014-11-12: 1 mg via INTRAVENOUS

## 2014-11-12 MED ORDER — FENTANYL CITRATE (PF) 100 MCG/2ML IJ SOLN
INTRAMUSCULAR | Status: AC
  Filled 2014-11-12: qty 4

## 2014-11-12 MED ORDER — MAGNESIUM CITRATE PO SOLN: 1.0000 | Freq: Once | ORAL | Status: DC | PRN

## 2014-11-12 MED ORDER — DEXAMETHASONE SODIUM PHOSPHATE 10 MG/ML IJ SOLN
INTRAMUSCULAR | Status: DC | PRN
  Administered 2014-11-12: 10 mg via INTRAVENOUS

## 2014-11-12 MED ORDER — METOCLOPRAMIDE HCL 10 MG PO TABS
5.0000 mg | ORAL_TABLET | Freq: Three times a day (TID) | ORAL | Status: DC | PRN
  Administered 2014-11-13: 10 mg via ORAL
  Filled 2014-11-12 (×2): qty 1

## 2014-11-12 MED ORDER — BUDESONIDE 0.5 MG/2ML IN SUSP: 0.5000 mg | Freq: Two times a day (BID) | RESPIRATORY_TRACT | Status: DC | PRN

## 2014-11-12 MED ORDER — PHENOL 1.4 % MT LIQD: 1.0000 | OROMUCOSAL | Status: DC | PRN

## 2014-11-12 MED ORDER — DEXAMETHASONE SODIUM PHOSPHATE 10 MG/ML IJ SOLN
10.0000 mg | Freq: Once | INTRAMUSCULAR | Status: AC
  Administered 2014-11-13: 10 mg via INTRAVENOUS
  Filled 2014-11-12: qty 1

## 2014-11-12 MED ORDER — PROPOFOL 10 MG/ML IV BOLUS
INTRAVENOUS | Status: AC
  Filled 2014-11-12: qty 20

## 2014-11-12 MED ORDER — GLYCOPYRROLATE 0.2 MG/ML IJ SOLN
INTRAMUSCULAR | Status: DC | PRN
  Administered 2014-11-12: 0.6 mg via INTRAVENOUS

## 2014-11-12 MED ORDER — METHOCARBAMOL 500 MG PO TABS
500.0000 mg | ORAL_TABLET | Freq: Four times a day (QID) | ORAL | Status: DC | PRN
  Administered 2014-11-13 – 2014-11-15 (×4): 500 mg via ORAL
  Filled 2014-11-12 (×7): qty 1

## 2014-11-12 MED ORDER — MIDAZOLAM HCL 2 MG/2ML IJ SOLN
INTRAMUSCULAR | Status: AC
  Filled 2014-11-12: qty 4

## 2014-11-12 MED ORDER — CEFAZOLIN SODIUM-DEXTROSE 2-3 GM-% IV SOLR
INTRAVENOUS | Status: AC
  Filled 2014-11-12: qty 50

## 2014-11-12 MED ORDER — LIDOCAINE HCL (CARDIAC) 20 MG/ML IV SOLN
INTRAVENOUS | Status: AC
  Filled 2014-11-12: qty 5

## 2014-11-12 MED ORDER — LOSARTAN POTASSIUM 50 MG PO TABS
50.0000 mg | ORAL_TABLET | Freq: Every day | ORAL | Status: DC
  Administered 2014-11-12 – 2014-11-15 (×3): 50 mg via ORAL
  Filled 2014-11-12 (×4): qty 1

## 2014-11-12 MED ORDER — SODIUM CHLORIDE 0.9 % IV SOLN
100.0000 mL/h | INTRAVENOUS | Status: DC
  Administered 2014-11-12 – 2014-11-13 (×3): 100 mL/h via INTRAVENOUS
  Filled 2014-11-12 (×9): qty 1000

## 2014-11-12 MED ORDER — CHLORHEXIDINE GLUCONATE 4 % EX LIQD: 60.0000 mL | Freq: Once | CUTANEOUS | Status: DC

## 2014-11-12 MED ORDER — CEFAZOLIN SODIUM 1-5 GM-% IV SOLN
1.0000 g | Freq: Once | INTRAVENOUS | Status: AC
  Administered 2014-11-12: 1 g via INTRAVENOUS
  Filled 2014-11-12: qty 50

## 2014-11-12 MED ORDER — APIXABAN 2.5 MG PO TABS
2.5000 mg | ORAL_TABLET | Freq: Two times a day (BID) | ORAL | Status: DC
  Administered 2014-11-13 – 2014-11-15 (×5): 2.5 mg via ORAL
  Filled 2014-11-12 (×9): qty 1

## 2014-11-12 MED ORDER — DEXAMETHASONE SODIUM PHOSPHATE 10 MG/ML IJ SOLN
INTRAMUSCULAR | Status: AC
  Filled 2014-11-12: qty 1

## 2014-11-12 MED ORDER — LIDOCAINE HCL (CARDIAC) 20 MG/ML IV SOLN
INTRAVENOUS | Status: DC | PRN
  Administered 2014-11-12: 100 mg via INTRAVENOUS

## 2014-11-12 MED ORDER — POLYETHYLENE GLYCOL 3350 17 G PO PACK
17.0000 g | PACK | Freq: Two times a day (BID) | ORAL | Status: DC
Start: 2014-11-12 — End: 2014-11-15
  Administered 2014-11-12 – 2014-11-15 (×3): 17 g via ORAL

## 2014-11-12 MED ORDER — ROCURONIUM BROMIDE 100 MG/10ML IV SOLN
INTRAVENOUS | Status: AC
  Filled 2014-11-12: qty 1

## 2014-11-12 MED ORDER — FENTANYL CITRATE (PF) 100 MCG/2ML IJ SOLN
25.0000 ug | Freq: Once | INTRAMUSCULAR | Status: AC
  Administered 2014-11-12: 25 ug via INTRAVENOUS
  Filled 2014-11-12: qty 2

## 2014-11-12 MED ORDER — CEFAZOLIN SODIUM-DEXTROSE 2-3 GM-% IV SOLR
2.0000 g | Freq: Four times a day (QID) | INTRAVENOUS | Status: AC
  Administered 2014-11-12 (×2): 2 g via INTRAVENOUS
  Filled 2014-11-12 (×2): qty 50

## 2014-11-12 MED ORDER — METOCLOPRAMIDE HCL 5 MG/ML IJ SOLN
5.0000 mg | Freq: Three times a day (TID) | INTRAMUSCULAR | Status: DC | PRN
  Administered 2014-11-12 – 2014-11-14 (×3): 10 mg via INTRAVENOUS
  Filled 2014-11-12 (×4): qty 2

## 2014-11-12 MED ORDER — LACTATED RINGERS IV SOLN
INTRAVENOUS | Status: DC
  Administered 2014-11-12 (×4): via INTRAVENOUS

## 2014-11-12 MED ORDER — ACETAMINOPHEN 10 MG/ML IV SOLN
INTRAVENOUS | Status: AC
  Filled 2014-11-12: qty 100

## 2014-11-12 MED ORDER — HYDROMORPHONE HCL 1 MG/ML IJ SOLN
0.5000 mg | INTRAMUSCULAR | Status: DC | PRN
  Administered 2014-11-12 (×2): 1 mg via INTRAVENOUS
  Administered 2014-11-12: 0.5 mg via INTRAVENOUS
  Filled 2014-11-12 (×3): qty 1

## 2014-11-12 MED ORDER — ONDANSETRON HCL 4 MG/2ML IJ SOLN: 4.0000 mg | Freq: Once | INTRAMUSCULAR | Status: DC | PRN

## 2014-11-12 MED ORDER — DOCUSATE SODIUM 100 MG PO CAPS
100.0000 mg | ORAL_CAPSULE | Freq: Two times a day (BID) | ORAL | Status: DC
  Administered 2014-11-12 – 2014-11-15 (×5): 100 mg via ORAL

## 2014-11-12 MED ORDER — TRANEXAMIC ACID 1000 MG/10ML IV SOLN
1000.0000 mg | Freq: Once | INTRAVENOUS | Status: AC
  Administered 2014-11-12: 1000 mg via INTRAVENOUS
  Filled 2014-11-12: qty 10

## 2014-11-12 MED ORDER — DULOXETINE HCL 60 MG PO CPEP
60.0000 mg | ORAL_CAPSULE | Freq: Every day | ORAL | Status: DC
  Administered 2014-11-12 – 2014-11-14 (×3): 60 mg via ORAL
  Filled 2014-11-12 (×4): qty 1

## 2014-11-12 MED ORDER — ONDANSETRON HCL 4 MG/2ML IJ SOLN
INTRAMUSCULAR | Status: DC | PRN
  Administered 2014-11-12: 4 mg via INTRAVENOUS

## 2014-11-12 MED ORDER — ROCURONIUM BROMIDE 100 MG/10ML IV SOLN
INTRAVENOUS | Status: DC | PRN
  Administered 2014-11-12: 10 mg via INTRAVENOUS
  Administered 2014-11-12: 50 mg via INTRAVENOUS
  Administered 2014-11-12: 20 mg via INTRAVENOUS

## 2014-11-12 MED ORDER — ONDANSETRON HCL 4 MG PO TABS: 4.0000 mg | ORAL_TABLET | Freq: Four times a day (QID) | ORAL | Status: DC | PRN

## 2014-11-12 MED ORDER — ONDANSETRON HCL 4 MG/2ML IJ SOLN
INTRAMUSCULAR | Status: AC
  Filled 2014-11-12: qty 2

## 2014-11-12 MED ORDER — FENTANYL CITRATE (PF) 100 MCG/2ML IJ SOLN
INTRAMUSCULAR | Status: AC
  Administered 2014-11-12: 25 ug via INTRAVENOUS
  Filled 2014-11-12: qty 2

## 2014-11-12 MED ORDER — GLYCOPYRROLATE 0.2 MG/ML IJ SOLN
INTRAMUSCULAR | Status: AC
  Filled 2014-11-12: qty 3

## 2014-11-12 MED ORDER — BISACODYL 10 MG RE SUPP: 10.0000 mg | Freq: Every day | RECTAL | Status: DC | PRN

## 2014-11-12 MED ORDER — ONDANSETRON HCL 4 MG/2ML IJ SOLN
4.0000 mg | Freq: Four times a day (QID) | INTRAMUSCULAR | Status: DC | PRN
  Administered 2014-11-12 – 2014-11-13 (×2): 4 mg via INTRAVENOUS
  Filled 2014-11-12 (×2): qty 2

## 2014-11-12 MED ORDER — FERROUS SULFATE 325 (65 FE) MG PO TABS
325.0000 mg | ORAL_TABLET | Freq: Three times a day (TID) | ORAL | Status: DC
  Administered 2014-11-14 – 2014-11-15 (×2): 325 mg via ORAL
  Filled 2014-11-12 (×11): qty 1

## 2014-11-12 MED ORDER — NEOSTIGMINE METHYLSULFATE 10 MG/10ML IV SOLN
INTRAVENOUS | Status: AC
  Filled 2014-11-12: qty 1

## 2014-11-12 SURGICAL SUPPLY — 40 items
BAG DECANTER FOR FLEXI CONT (MISCELLANEOUS) ×1 IMPLANT
BAG SPEC THK2 15X12 ZIP CLS (MISCELLANEOUS) ×2
BAG ZIPLOCK 12X15 (MISCELLANEOUS) ×6 IMPLANT
CAPT HIP TOTAL 2 ×4 IMPLANT
COVER PERINEAL POST (MISCELLANEOUS) ×3 IMPLANT
DRAPE C-ARM 42X120 X-RAY (DRAPES) ×6 IMPLANT
DRAPE STERI IOBAN 125X83 (DRAPES) ×6 IMPLANT
DRAPE U-SHAPE 47X51 STRL (DRAPES) ×21 IMPLANT
DRSG AQUACEL AG ADV 3.5X10 (GAUZE/BANDAGES/DRESSINGS) ×6 IMPLANT
DURAPREP 26ML APPLICATOR (WOUND CARE) ×6 IMPLANT
ELECT BLADE TIP CTD 4 INCH (ELECTRODE) ×5 IMPLANT
ELECT PENCIL ROCKER SW 15FT (MISCELLANEOUS) ×3 IMPLANT
ELECT REM PT RETURN 9FT ADLT (ELECTROSURGICAL) ×3
ELECTRODE REM PT RTRN 9FT ADLT (ELECTROSURGICAL) ×2 IMPLANT
FACESHIELD WRAPAROUND (MASK) ×15 IMPLANT
FACESHIELD WRAPAROUND OR TEAM (MASK) ×4 IMPLANT
GLOVE BIOGEL PI IND STRL 7.5 (GLOVE) ×2 IMPLANT
GLOVE BIOGEL PI IND STRL 8.5 (GLOVE) ×2 IMPLANT
GLOVE BIOGEL PI INDICATOR 7.5 (GLOVE) ×4
GLOVE BIOGEL PI INDICATOR 8.5 (GLOVE) ×4
GLOVE ECLIPSE 8.0 STRL XLNG CF (GLOVE) ×8 IMPLANT
GLOVE ORTHO TXT STRL SZ7.5 (GLOVE) ×8 IMPLANT
GOWN SPEC L3 XXLG W/TWL (GOWN DISPOSABLE) ×6 IMPLANT
GOWN STRL REUS W/TWL LRG LVL3 (GOWN DISPOSABLE) ×3 IMPLANT
KIT BASIN OR (CUSTOM PROCEDURE TRAY) ×4 IMPLANT
LIQUID BAND (GAUZE/BANDAGES/DRESSINGS) ×6 IMPLANT
NDL SAFETY ECLIPSE 18X1.5 (NEEDLE) ×1 IMPLANT
NEEDLE HYPO 18GX1.5 SHARP (NEEDLE)
PACK TOTAL JOINT (CUSTOM PROCEDURE TRAY) ×3 IMPLANT
SAW OSC TIP CART 19.5X105X1.3 (SAW) ×5 IMPLANT
SPONGE LAP 18X18 X RAY DECT (DISPOSABLE) ×2 IMPLANT
SUT MNCRL AB 4-0 PS2 18 (SUTURE) ×6 IMPLANT
SUT VIC AB 1 CT1 36 (SUTURE) ×24 IMPLANT
SUT VIC AB 2-0 CT1 27 (SUTURE) ×12
SUT VIC AB 2-0 CT1 TAPERPNT 27 (SUTURE) ×4 IMPLANT
SYR 50ML LL SCALE MARK (SYRINGE) ×1 IMPLANT
TOWEL OR 17X26 10 PK STRL BLUE (TOWEL DISPOSABLE) ×4 IMPLANT
TRAY FOLEY W/METER SILVER 14FR (SET/KITS/TRAYS/PACK) ×3 IMPLANT
TRAY FOLEY W/METER SILVER 16FR (SET/KITS/TRAYS/PACK) ×1 IMPLANT
YANKAUER SUCT BULB TIP 10FT TU (MISCELLANEOUS) ×3 IMPLANT

## 2014-11-12 NOTE — Anesthesia Postprocedure Evaluation (Signed)
  Anesthesia Post-op Note  Patient: [name]  Procedure(s) Performed: Procedure(s) (LRB): BILATERAL ANTERIOR TOTAL HIP ARTHROPLASTY (Bilateral)  Patient Location: PACU  Anesthesia Type: General  Level of Consciousness: awake and alert   Airway and Oxygen Therapy: Patient Spontanous Breathing  Post-op Pain: mild  Post-op Assessment: Post-op Vital signs reviewed, Patient's Cardiovascular Status Stable, Respiratory Function Stable, Patent Airway and No signs of Nausea or vomiting  Last Vitals:  Filed Vitals:   11/12/14 1905  BP: 127/60  Pulse: 106  Temp: 37.1 C  Resp: 16    Post-op Vital Signs: stable   Complications: No apparent anesthesia complications

## 2014-11-12 NOTE — Op Note (Signed)
NAME:  [name]                ACCOUNT NO.: [id number]      MEDICAL RECORD NO.: [id number]      FACILITY:  [facility]      PHYSICIAN:  Paralee Cancel D  DATE OF BIRTH:  09/17/1945     DATE OF PROCEDURE:  11/12/2014                                 OPERATIVE REPORT         PREOPERATIVE DIAGNOSIS: Left  hip osteoarthritis.      POSTOPERATIVE DIAGNOSIS:  Left hip osteoarthritis.      PROCEDURE:  Left total hip replacement through an anterior approach   utilizing DePuy THR system, component size 79mm pinnacle cup, a size 32+4 neutral   Altrex liner, a size 3 Hi Tri Lock stem with a 32+5 delta ceramic   ball.      SURGEON:  Pietro Cassis. Alvan Dame, M.D.      ASSISTANT:  Danae Orleans, PA-C      ANESTHESIA:  General.      SPECIMENS:  None.      COMPLICATIONS:  None.      BLOOD LOSS:  350 cc     DRAINS:  None.      INDICATION OF THE PROCEDURE:  [name] is a 69 y.o. female who had   presented to office for evaluation of bilateral hip pain.  Radiographs revealed   progressive degenerative changes with bone-on-bone   articulation to the  hip joint.  The patient had painful limited range of   motion significantly affecting their overall quality of life.  The patient was failing to    respond to conservative measures, and at this point was ready   to proceed with more definitive measures.  The patient has noted progressive   degenerative changes in his hip, progressive problems and dysfunction   with regarding the hip prior to surgery.  Consent was obtained for   benefit of pain relief.  Specific risk of infection, DVT, component   failure, dislocation, need for revision surgery, as well discussion of   the anterior versus posterior approach were reviewed.  Consent was   obtained for benefit of anterior pain relief through an anterior   approach.      PROCEDURE IN DETAIL:  The patient was brought to operative theater.   Once adequate anesthesia,  preoperative antibiotics, 2gm of Ancef, 1 gm of Tranexamic Acid, and 10 mg of Decadron administered.   The patient was positioned supine on the OSI Hanna table.  Once adequate   padding of boney process was carried out, we had predraped out the hip, and  used fluoroscopy to confirm orientation of the pelvis and position.      The left hip was then prepped and draped from proximal iliac crest to   mid thigh with shower curtain technique.      Time-out was performed identifying the patient, planned procedure, and   extremity.     An incision was then made 2 cm distal and lateral to the   anterior superior iliac spine extending over the orientation of the   tensor fascia lata muscle and sharp dissection was carried down to the   fascia of the muscle and protractor placed in the soft tissues.      The fascia  was then incised.  The muscle belly was identified and swept   laterally and retractor placed along the superior neck.  Following   cauterization of the circumflex vessels and removing some pericapsular   fat, a second cobra retractor was placed on the inferior neck.  A third   retractor was placed on the anterior acetabulum after elevating the   anterior rectus.  A L-capsulotomy was along the line of the   superior neck to the trochanteric fossa, then extended proximally and   distally.  Tag sutures were placed and the retractors were then placed   intracapsular.  We then identified the trochanteric fossa and   orientation of my neck cut, confirmed this radiographically   and then made a neck osteotomy with the femur on traction.  The femoral   head was removed without difficulty or complication.  Traction was let   off and retractors were placed posterior and anterior around the   acetabulum.      The labrum and foveal tissue were debrided.  I began reaming with a 73mm   reamer and reamed up to 66mm reamer with good bony bed preparation and a 59mm   cup was chosen.  The final 6mm  Pinnacle cup was then impacted under fluoroscopy  to confirm the depth of penetration and orientation with respect to   abduction.  A screw was placed followed by the hole eliminator.  The final   32+4 neutral Altrex liner was impacted with good visualized rim fit.  The cup was positioned anatomically within the acetabular portion of the pelvis.      At this point, the femur was rolled at 80 degrees.  Further capsule was   released off the inferior aspect of the femoral neck.  I then   released the superior capsule proximally.  The hook was placed laterally   along the femur and elevated manually and held in position with the bed   hook.  The leg was then extended and adducted with the leg rolled to 100   degrees of external rotation.  Once the proximal femur was fully   exposed, I used a box osteotome to set orientation.  I then began   broaching with the starting chili pepper broach and passed this by hand and then broached up to 3.  With the 3 broach in place I chose a high offset neck and did a trial reduction.  The offset was appropriate, leg lengths   appeared to be equal, confirmed radiographically.   Given these findings, I went ahead and dislocated the hip, repositioned all   retractors and positioned the right hip in the extended and abducted position.  The final 3 Hi Tri Lock stem was   chosen and it was impacted down to the level of neck cut.  Based on this   and the trial reduction, a 32+5 delta ceramic ball was chosen and   impacted onto a clean and dry trunnion, and the hip was reduced.  The   hip had been irrigated throughout the case again at this point.  I did   reapproximate the superior capsular leaflet to the anterior leaflet   using #1 Vicryl.  The fascia of the   tensor fascia lata muscle was then reapproximated using #1 Vicryl and #0 V-lock sutures.  The   remaining wound was closed with 2-0 Vicryl and running 4-0 Monocryl.   The hip was cleaned, dried, and dressed  sterilely using Dermabond and  Aquacel dressing.  After closing this left hip we broke down the drapes and re-draped the right hip.    Danae Orleans, PA-C was present for the entirety of the case involved from   preoperative positioning, perioperative retractor management, general   facilitation of the case, as well as primary wound closure as assistant.            Pietro Cassis Alvan Dame, M.D.        11/12/2014 12:12 PM         NAME:  SETH FRIEDLANDER                ACCOUNT NO.: [id number]      MEDICAL RECORD NO.: [id number]      FACILITY:  [facility]      PHYSICIAN:  Paralee Cancel D  DATE OF BIRTH:  [birth year]     DATE OF PROCEDURE:  11/12/2014                                 OPERATIVE REPORT         PREOPERATIVE DIAGNOSIS: Right  hip osteoarthritis.      POSTOPERATIVE DIAGNOSIS:  Right hip osteoarthritis.      PROCEDURE:  Right total hip replacement through an anterior approach   utilizing DePuy THR system, component size 60mm pinnacle cup, a size 32+4 neutral   Altrex liner, a size 2 Hi Tri Lock stem with a 32+1 delta ceramic   ball.      SURGEON:  Pietro Cassis. Alvan Dame, M.D.      ASSISTANT:  Danae Orleans, PA-C     ANESTHESIA:  General.      SPECIMENS:  None.      COMPLICATIONS:  None.      BLOOD LOSS:  300 cc     DRAINS:  None.      INDICATION OF THE PROCEDURE:  [name] is a 69 y.o. female who had   presented to office for evaluation of bilateral hip pain.  Radiographs revealed   progressive degenerative changes with bone-on-bone   articulation to the  hip joint.  The patient had painful limited range of   motion significantly affecting their overall quality of life.  The patient was failing to    respond to conservative measures, and at this point was ready   to proceed with more definitive measures.  The patient has noted progressive   degenerative changes in his hip, progressive problems and dysfunction   with regarding the hip prior  to surgery.  Consent was obtained for   benefit of pain relief.  Specific risk of infection, DVT, component   failure, dislocation, need for revision surgery, as well discussion of   the anterior versus posterior approach were reviewed.  Consent was   obtained for benefit of anterior pain relief through an anterior   approach.      PROCEDURE IN DETAIL:  The patient was brought to operative theater.   Once adequate anesthesia, preoperative antibiotics, 1gm of Ancef (after already administering 2 gm for onset of procedure on left) administered.   The patient was positioned supine on the OSI Hanna table.  Once adequate   padding of boney process was carried out, we had predraped out the hip, and  used fluoroscopy to confirm orientation of the pelvis and position.      The right hip was then prepped and draped from proximal iliac crest  to   mid thigh with shower curtain technique.      Time-out was performed identifying the patient, planned procedure, and   extremity.     An incision was then made 2 cm distal and lateral to the   anterior superior iliac spine extending over the orientation of the   tensor fascia lata muscle and sharp dissection was carried down to the   fascia of the muscle and protractor placed in the soft tissues.      The fascia was then incised.  The muscle belly was identified and swept   laterally and retractor placed along the superior neck.  Following   cauterization of the circumflex vessels and removing some pericapsular   fat, a second cobra retractor was placed on the inferior neck.  A third   retractor was placed on the anterior acetabulum after elevating the   anterior rectus.  A L-capsulotomy was along the line of the   superior neck to the trochanteric fossa, then extended proximally and   distally.  Tag sutures were placed and the retractors were then placed   intracapsular.  We then identified the trochanteric fossa and   orientation of my neck cut,  confirmed this radiographically   and then made a neck osteotomy with the femur on traction.  The femoral   head was removed without difficulty or complication.  Traction was let   off and retractors were placed posterior and anterior around the   acetabulum.      The labrum and foveal tissue were debrided.  I began reaming with a 57mm   reamer and reamed up to 63mm reamer with good bony bed preparation and a 23mm   cup was chosen.  The final 41mm Pinnacle cup was then impacted under fluoroscopy  to confirm the depth of penetration and orientation with respect to   abduction.  A screw was placed followed by the hole eliminator.  The final   32+4 neutral Altrex liner was impacted with good visualized rim fit.  The cup was positioned anatomically within the acetabular portion of the pelvis.      At this point, the femur was rolled at 80 degrees.  Further capsule was   released off the inferior aspect of the femoral neck.  I then   released the superior capsule proximally.  The hook was placed laterally   along the femur and elevated manually and held in position with the bed   hook.  The leg was then extended and adducted with the leg rolled to 100   degrees of external rotation.  Once the proximal femur was fully   exposed, I used a box osteotome to set orientation.  I then began   broaching with the starting chili pepper broach and passed this by hand and then broached up to 2.  With the 2 broach in place I chose a high offset neck and did a trial reduction with the +1 head ball.  I identified this to be the best option to match the other hip based on position of the right acetabular component as opposed to matching the other hip components.  The offset was appropriate, leg lengths   appeared to be equal, confirmed radiographically.   Given these findings, I went ahead and dislocated the hip, repositioned all   retractors and positioned the right hip in the extended and abducted position.  The  final 2 Hi Tri Lock stem was   chosen and it was impacted  down to the level of neck cut.  Based on this   and the trial reduction, a 32+1 delta ceramic ball was chosen and   impacted onto a clean and dry trunnion, and the hip was reduced.  The   hip had been irrigated throughout the case again at this point.  I did   reapproximate the superior capsular leaflet to the anterior leaflet   using #1 Vicryl.  The fascia of the   tensor fascia lata muscle was then reapproximated using #1 Vicryl and #0 V-lock sutures.  The   remaining wound was closed with 2-0 Vicryl and running 4-0 Monocryl.   The hip was cleaned, dried, and dressed sterilely using Dermabond and   Aquacel dressing.  She was then brought   to recovery room in stable condition tolerating the procedure well.    Danae Orleans, PA-C was present for the entirety of the case involved from   preoperative positioning, perioperative retractor management, general   facilitation of the case, as well as primary wound closure as assistant.            Pietro Cassis Alvan Dame, M.D.        11/12/2014 12:12 PM

## 2014-11-12 NOTE — Plan of Care (Signed)
Problem: Consults Goal: Diagnosis- Total Joint Replacement Primary Total Hip     

## 2014-11-12 NOTE — H&P (Signed)
TOTAL HIP ADMISSION H&P  Patient is admitted for bilateral total hip arthroplasties.  Subjective:  Chief Complaint:   Bilateral hip primary OA /pain  HPI: [name], 69 y.o. female, has a history of pain and functional disability in the bilateral hip(s) due to arthritis and patient has failed non-surgical conservative treatments for greater than 12 weeks to include NSAID's and/or analgesics, corticosteriod injections, use of assistive devices and activity modification.  Onset of symptoms was gradual starting 10+ months ago with rapidlly worsening course since that time.The patient noted no past surgery on the bilateral hip(s).  Patient currently rates pain in the bilateral hip at 10 out of 10 with activity. Patient has night pain, worsening of pain with activity and weight bearing, trendelenberg gait, pain that interfers with activities of daily living and pain with passive range of motion. Patient has evidence of periarticular osteophytes and joint space narrowing by imaging studies. This condition presents safety issues increasing the risk of falls.  There is no current active infection.  Risks, benefits and expectations were discussed with the patient.  Risks including but not limited to the risk of anesthesia, blood clots, nerve damage, blood vessel damage, failure of the prosthesis, infection and up to and including death.  Patient understand the risks, benefits and expectations and wishes to proceed with surgery.   PCP: [name], MD  D/C Plans:      Home with HHPT  Post-op Meds:       No Rx given  Tranexamic Acid:      To be given - IV  Decadron:      Is to be given  FYI:     Eliquis post-op (on pre-op)   Norco post-op   Patient Active Problem List   Diagnosis Date Noted  . Confusion 10/13/2014  . Acute confusional state 10/13/2014  . 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 Aftercare following joint replacement 03/28/2014  . History of artificial  joint 03/28/2014  . Degenerative arthritis of hip 09/27/2013  . Paroxysmal atrial fibrillation 09/07/2013  . Tachycardia 07/03/2013  . Snoring 07/03/2013  . Diverticulosis of colon without hemorrhage 06/26/2013  . Abdominal pain, right upper quadrant 01/04/2013  . Nausea alone 01/04/2013  . Hoarseness of voice 01/04/2013  . Diarrhea 01/04/2013  . Broken toe 03/09/2012  . Dyspnea 11/05/2011  . Upper airway cough syndrome 11/05/2011  . Asthma, persistent controlled 11/05/2011  . Morbid obesity with BMI of 45.0-49.9, adult 11/05/2011  . HTN (hypertension) 09/09/2011  . Hyperlipemia 09/09/2011  . Bursitis, trochanteric 08/09/2011  . Ankle arthropathy 05/31/2011   Past Medical History  Diagnosis Date  . Asthma   . Arthritis   . Hypertension   . Unstable angina 09/09/2011  . HTN (hypertension) 09/09/2011  . Hyperlipemia 09/09/2011  . GERD (gastroesophageal reflux disease) 09/09/2011  . Anxiety   . Diverticulosis   . Antral polyp     benign  . IBS (irritable bowel syndrome)   . Obesity   . Internal hemorrhoid   . Vocal cord dysfunction 2013    swelling  . Shortness of breath   . Tachycardia   . Paroxysmal atrial fibrillation   . PONV (postoperative nausea and vomiting)     with big surgeries, none with endos etc, with colonoscopy- 5-6 years ago could not swallow   . Sleep apnea     borderline per patient no cpap   . History of transfusion   . Depression     Past Surgical History  Procedure Laterality Date  . Total knee arthroplasty Bilateral     left 2002, right 2012  . Cholecystectomy    . Abdominal hysterectomy  1988  . Tumor excision  1964    rt. leg fatty tumor  . Shoulder surgery Right 1987  . Wrist surgery Left 1997, 2009   . Elbow surgery Left     removed bone chip  . Foot osteotomy w/ plantar fascia release Left   . Total ankle replacement Right     x 3  . Hip ilioban release Right 1997  . Total ankle replacement Right 2012  . Ankle fusion Right     x 2   . Thumb replacement Bilateral     one 2009 and one 2014  . Back surgery  [phone]    3 ruptured discs, lower back  . Nasal sinus surgery  1997  . Esophagogastroduodenoscopy [name] 03/06/2013    Procedure: ESOPHAGOGASTRODUODENOSCOPY (EGD);  Surgeon: Inda Castle, MD;  Location: [facility];  Service: Endoscopy;  Laterality: [name];  . Bravo ph study [name] 03/06/2013    Procedure: BRAVO [place] STUDY;  Surgeon: Inda Castle, MD;  Location: [facility];  Service: Endoscopy;  Laterality: [name];  . Tfc wrist Left 1997  . Left heart catheterization with coronary angiogram Bilateral 09/10/2011    Procedure: LEFT HEART CATHETERIZATION WITH CORONARY ANGIOGRAM;  Surgeon: Lorretta Harp, MD;  Location: [facility] CATH LAB;  Service: Cardiovascular;  Laterality: Bilateral;  . Laparoscopic gastric sleeve resection with hiatal hernia repair  08/20/2014    Procedure: LAPAROSCOPIC GASTRIC SLEEVE RESECTION WITH HIATAL HERNIA  REPAIR AND  UPPER ENDOSCOPY;  Surgeon: Johnathan Hausen, MD;  Location: [facility] ORS;  Service: General;;  . Upper gi endoscopy  08/20/2014    Procedure: UPPER GI ENDOSCOPY;  Surgeon: Johnathan Hausen, MD;  Location: [facility] ORS;  Service: General;;  . Cardiac catheterization  2013    NORMAL  . Transthoracic echocardiogram  09/09/2011    MILD CONCENTRIC LVH. TRACE MITRAL REGURG. TRACE TR. MILD AORTIC REGURG.    Prescriptions prior to admission  Medication Sig Dispense Refill Last Dose  . acetaminophen (TYLENOL) 500 MG tablet Take 1,000 mg by mouth 3 (three) times daily as needed for moderate pain or headache.    11/11/2014 at Unknown time  . apixaban (ELIQUIS) 5 MG TABS tablet Take 1 tablet (5 mg total) by mouth 2 (two) times daily. 60 tablet 7 11/08/2014  . Calcium Citrate-Vitamin D 500-500 MG-UNIT CHEW Chew 1 tablet by mouth 3 (three) times daily.   Past Week at Unknown time  . cloNIDine (CATAPRES) 0.1 MG tablet Take 0.1 mg by mouth 3 (three) times daily.   11/11/2014 at Unknown time  . clorazepate (TRANXENE)  7.5 MG tablet Take 7.5 mg by mouth 3 (three) times daily as needed for anxiety.   11/12/2014 at 0700  . diclofenac (VOLTAREN) 75 MG EC tablet Take 1 tablet by mouth 2 (two) times daily as needed for moderate pain.    Past Month at Unknown time  . diltiazem (CARDIZEM CD) 180 MG 24 hr capsule Take 1 capsule (180 mg total) by mouth daily. 30 capsule 7 11/11/2014 at Unknown time  . DULoxetine (CYMBALTA) 60 MG capsule Take 1 capsule by mouth daily with supper.   11/11/2014 at Unknown time  . flintstones complete (FLINTSTONES) 60 MG chewable tablet Chew 1 tablet by mouth daily.   Past Week at Unknown time  . ibuprofen (ADVIL,MOTRIN) 600 MG tablet Take 1 tablet (600 mg total) by  mouth every 12 (twelve) hours as needed for headache. 20 tablet 0 Past Week at Unknown time  . losartan (COZAAR) 50 MG tablet Take 50 mg by mouth daily.    11/11/2014 at Unknown time  . pantoprazole (PROTONIX) 40 MG tablet TAKE 1 TABLET TWICE DAILY 180 tablet 3 11/11/2014 at Unknown time  . Probiotic Product (RESTORA) CAPS Take 1 capsule by mouth daily. (Patient taking differently: Take 1 capsule by mouth daily with supper. ) 30 capsule 2 11/12/2014 at 0700  . traMADol (ULTRAM) 50 MG tablet Take 50 mg by mouth 3 (three) times daily as needed for moderate pain.   11/12/2014 at 0700  . vitamin B-12 (CYANOCOBALAMIN) 1000 MCG tablet Take 1,000 mcg by mouth daily.   Past Month at Unknown time  . albuterol (PROVENTIL HFA;VENTOLIN HFA) 108 (90 BASE) MCG/ACT inhaler Inhale 1-2 puffs into the lungs every 6 (six) hours as needed for wheezing or shortness of breath.   More than a month at Unknown time  . budesonide (PULMICORT) 0.5 MG/2ML nebulizer solution Take 2 mLs (0.5 mg total) by nebulization 2 (two) times daily. (Patient taking differently: Take 0.5 mg by nebulization 2 (two) times daily as needed (shorntess of breathe.). ) 120 mL 12 More than a month at Unknown time  . levalbuterol (XOPENEX) 0.63 MG/3ML nebulizer solution Take 0.63 mg by  nebulization every 4 (four) hours as needed for wheezing or shortness of breath.   More than a month at Unknown time  . NONFORMULARY OR COMPOUNDED ITEM Resume outpatient water therapy as before to contraindications (Patient not taking: Reported on 10/31/2014) 1 each 0    Allergies  Allergen Reactions  . Ativan [Lorazepam] Other (See Comments)    Agitation, aggressive actions, significant mental changes  . Fentanyl Itching  . Morphine And Related Itching and Rash    Social History  Substance Use Topics  . Smoking status: Never Smoker   . Smokeless tobacco: Never Used  . Alcohol Use: No    Family History  Problem Relation Age of Onset  . Hypertension Mother   . Diabetes Mother   . Arthritis Mother   . Esophageal cancer Mother   . Esophageal cancer Father 87  . Coronary artery disease Neg Hx   . Colon cancer Neg Hx   . Liver disease Neg Hx   . Kidney disease Neg Hx   . Other Sister     kidney mass removed  . Hypertension Sister   . Heart disease Maternal Grandmother   . Stroke Paternal Grandmother      Review of Systems  Constitutional: Negative.   HENT: Negative.   Eyes: Negative.   Respiratory: Negative.   Cardiovascular: Negative.   Gastrointestinal: Negative.   Genitourinary: Negative.   Musculoskeletal: Positive for joint pain.  Skin: Negative.   Neurological: Negative.   Endo/Heme/Allergies: Negative.   Psychiatric/Behavioral: Negative.     Objective:  Physical Exam  Constitutional: She is oriented to person, place, and time. She appears well-developed and well-nourished.  HENT:  Head: Normocephalic and atraumatic.  Mouth/Throat: She has dentures.  Eyes: Pupils are equal, round, and reactive to light.  Neck: Neck supple. No JVD present. No tracheal deviation present. No thyromegaly present.  Cardiovascular: Normal rate, regular rhythm, normal heart sounds and intact distal pulses.   Respiratory: Effort normal and breath sounds normal. No stridor. No  respiratory distress. She has no wheezes.  GI: Soft. There is no tenderness. There is no guarding.  Musculoskeletal:  Right hip: She exhibits decreased range of motion, decreased strength, tenderness and bony tenderness. She exhibits no swelling, no deformity and no laceration.       Left hip: She exhibits decreased range of motion, decreased strength, tenderness and bony tenderness. She exhibits no swelling, no deformity and no laceration.  Lymphadenopathy:    She has no cervical adenopathy.  Neurological: She is alert and oriented to person, place, and time.  Skin: Skin is warm and dry.  Psychiatric: She has a normal mood and affect.    Vital signs in last 24 hours: Temp:  [97.5 F (36.4 C)] 97.5 F (36.4 C) (08/23 0819) Pulse Rate:  [102] 102 (08/23 0819) Resp:  [16] 16 (08/23 0819) BP: (130)/(86) 130/86 mmHg (08/23 0819) SpO2:  [100 %] 100 % (08/23 0819) Weight:  [105.688 kg (233 lb)] 105.688 kg (233 lb) (08/23 0912)  Labs:   Estimated body mass index is 39.97 kg/(m^2) as calculated from the following:   Height as of this encounter: 5\' 4"  (1.626 m).   Weight as of this encounter: 105.688 kg (233 lb).   Imaging Review Plain radiographs demonstrate severe degenerative joint disease of the bilateral hip(s). The bone quality appears to be good for age and reported activity level.  Assessment/Plan:  End stage arthritis, bilateral hip(s)  The patient history, physical examination, clinical judgement of the provider and imaging studies are consistent with end stage degenerative joint disease of the bilateral hip(s) and total hip arthroplasty is deemed medically necessary. The treatment options including medical management, injection therapy, arthroscopy and arthroplasty were discussed at length. The risks and benefits of total hip arthroplasty were presented and reviewed. The risks due to aseptic loosening, infection, stiffness, dislocation/subluxation,  thromboembolic  complications and other imponderables were discussed.  The patient acknowledged the explanation, agreed to proceed with the plan and consent was signed. Patient is being admitted for inpatient treatment for surgery, pain control, PT, OT, prophylactic antibiotics, VTE prophylaxis, progressive ambulation and ADL's and discharge planning.The patient is planning to be discharged home with home health services.     West Pugh Mashell Sieben   PA-C  11/12/2014, 10:55 AM

## 2014-11-12 NOTE — Interval H&P Note (Signed)
History and Physical Interval Note:  11/12/2014 11:06 AM  [name]  has presented today for surgery, with the diagnosis of left hip osteoarthritis  The various methods of treatment have been discussed with the patient and family. After consideration of risks, benefits and other options for treatment, the patient has consented to  Procedure(s): BILATERAL ANTERIOR TOTAL HIP ARTHROPLASTY (Bilateral) as a surgical intervention .  The patient's history has been reviewed, patient examined, no change in status, stable for surgery.  I have reviewed the patient's chart and labs.  Questions were answered to the patient's satisfaction.     Mauri Pole

## 2014-11-12 NOTE — Transfer of Care (Signed)
Immediate Anesthesia Transfer of Care Note  Patient: [name]  Procedure(s) Performed: Procedure(s): BILATERAL ANTERIOR TOTAL HIP ARTHROPLASTY (Bilateral)  Patient Location: PACU  Anesthesia Type:General  Level of Consciousness: sedated  Airway & Oxygen Therapy: Patient Spontanous Breathing and Patient connected to face mask oxygen  Post-op Assessment: Report given to RN and Post -op Vital signs reviewed and stable  Post vital signs: Reviewed and stable  Last Vitals:  Filed Vitals:   11/12/14 0819  BP: 130/86  Pulse: 102  Temp: 36.4 C  Resp: 16    Complications: No apparent anesthesia complications

## 2014-11-12 NOTE — OR Nursing (Signed)
Closure to the left anterior hip started at 1302.

## 2014-11-12 NOTE — Progress Notes (Signed)
Utilization review completed.  

## 2014-11-12 NOTE — Anesthesia Procedure Notes (Signed)
Procedure Name: Intubation Date/Time: 11/12/2014 11:38 AM Performed by: Lind Covert Pre-anesthesia Checklist: Patient identified, Timeout performed, Emergency Drugs available, Suction available and Patient being monitored Patient Re-evaluated:Patient Re-evaluated prior to inductionOxygen Delivery Method: Circle system utilized Preoxygenation: Pre-oxygenation with 100% oxygen Intubation Type: IV induction Laryngoscope Size: Mac and 4 Grade View: Grade I Tube type: Oral Tube size: 7.5 mm Number of attempts: 1 Airway Equipment and Method: Stylet Placement Confirmation: ETT inserted through vocal cords under direct vision,  breath sounds checked- equal and bilateral and positive ETCO2 Secured at: 21 cm Tube secured with: Tape Dental Injury: Teeth and Oropharynx as per pre-operative assessment

## 2014-11-12 NOTE — Anesthesia Preprocedure Evaluation (Signed)
Anesthesia Evaluation  Patient identified by MRN, date of birth, ID band Patient awake    Reviewed: Allergy & Precautions, NPO status , Patient's Chart, lab work & pertinent test results  History of Anesthesia Complications (+) PONV and history of anesthetic complications  Airway Mallampati: II  TM Distance: >3 FB     Dental no notable dental hx. (+) Teeth Intact, Dental Advisory Given   Pulmonary asthma , sleep apnea ,  breath sounds clear to auscultation  Pulmonary exam normal       Cardiovascular Exercise Tolerance: Poor hypertension, Pt. on medications + angina with exertion Normal cardiovascular exam+ dysrhythmias Atrial Fibrillation Rhythm:Irregular Rate:Abnormal  Paroxysmal A-fib--off Apixaban for last 3 days   Neuro/Psych PSYCHIATRIC DISORDERS Anxiety Depression negative neurological ROS     GI/Hepatic Neg liver ROS, GERD-  Medicated and Controlled,S/p sleeve gastrectomy   Endo/Other  Morbid obesity  Renal/GU negative Renal ROS     Musculoskeletal  (+) Arthritis -, Osteoarthritis,    Abdominal   Peds  Hematology   Anesthesia Other Findings   Reproductive/Obstetrics                             Anesthesia Physical  Anesthesia Plan  ASA: III  Anesthesia Plan: General   Post-op Pain Management:    Induction: Intravenous  Airway Management Planned: Oral ETT  Additional Equipment:   Intra-op Plan:   Post-operative Plan: Extubation in OR  Informed Consent: I have reviewed the patients History and Physical, chart, labs and discussed the procedure including the risks, benefits and alternatives for the proposed anesthesia with the patient or authorized representative who has indicated his/her understanding and acceptance.   Dental advisory given  Plan Discussed with: CRNA, Anesthesiologist and Surgeon  Anesthesia Plan Comments: (Risks/benefits of general anesthesia  discussed with patient including risk of damage to teeth, lips, gum, and tongue, nausea/vomiting, allergic reactions to medications, and the possibility of heart attack, stroke and death.  All patient questions answered.  Patient wishes to proceed.  After discussion with patient about spinal vs general anesthesia, patient wishes to proceed with general anesthesia.)        Anesthesia Quick Evaluation

## 2014-11-13 ENCOUNTER — Encounter (HOSPITAL_COMMUNITY): Payer: Self-pay | Admitting: Orthopedic Surgery

## 2014-11-13 DIAGNOSIS — E669 Obesity, unspecified: Secondary | ICD-10-CM | POA: Diagnosis present

## 2014-11-13 LAB — CBC
HEMATOCRIT: 29.4 % — AB (ref 36.0–46.0)
HEMOGLOBIN: 8.7 g/dL — AB (ref 12.0–15.0)
MCH: 24.5 pg — ABNORMAL LOW (ref 26.0–34.0)
MCHC: 29.6 g/dL — AB (ref 30.0–36.0)
MCV: 82.8 fL (ref 78.0–100.0)
Platelets: 209 10*3/uL (ref 150–400)
RBC: 3.55 MIL/uL — AB (ref 3.87–5.11)
RDW: 17.9 % — ABNORMAL HIGH (ref 11.5–15.5)
WBC: 9.6 10*3/uL (ref 4.0–10.5)

## 2014-11-13 LAB — BASIC METABOLIC PANEL
ANION GAP: 5 (ref 5–15)
BUN: 15 mg/dL (ref 6–20)
CHLORIDE: 107 mmol/L (ref 101–111)
CO2: 23 mmol/L (ref 22–32)
CREATININE: 0.98 mg/dL (ref 0.44–1.00)
Calcium: 8.9 mg/dL (ref 8.9–10.3)
GFR calc non Af Amer: 58 mL/min — ABNORMAL LOW (ref >60)
Glucose, Bld: 147 mg/dL — ABNORMAL HIGH (ref 65–99)
Potassium: 4.7 mmol/L (ref 3.5–5.1)
SODIUM: 135 mmol/L (ref 135–145)

## 2014-11-13 MED ORDER — TRAMADOL HCL 50 MG PO TABS
50.0000 mg | ORAL_TABLET | Freq: Four times a day (QID) | ORAL | Status: DC | PRN
  Administered 2014-11-13 – 2014-11-15 (×8): 100 mg via ORAL
  Filled 2014-11-13 (×9): qty 2

## 2014-11-13 MED ORDER — PROMETHAZINE HCL 25 MG/ML IJ SOLN
6.2500 mg | Freq: Four times a day (QID) | INTRAMUSCULAR | Status: DC | PRN
  Administered 2014-11-13: 12.5 mg via INTRAVENOUS
  Filled 2014-11-13 (×2): qty 1

## 2014-11-13 MED ORDER — HYDROMORPHONE HCL 1 MG/ML IJ SOLN: 0.5000 mg | INTRAMUSCULAR | Status: DC | PRN

## 2014-11-13 NOTE — Progress Notes (Signed)
     Subjective: 1 Day Post-Op Procedure(s) (LRB): BILATERAL ANTERIOR TOTAL HIP ARTHROPLASTY (Bilateral)   Patient reports pain as mild, pain controlled. She is nauseated and threw up this morning, she feels that the pain medication might be causing it.   Objective:   VITALS:   Filed Vitals:   11/13/14 0512  BP: 124/56  Pulse: 102  Temp: 98.4 F (36.9 C)  Resp: 16    Dorsiflexion/Plantar flexion intact Incision: dressing C/D/I No cellulitis present Compartment soft  LABS  Recent Labs  11/13/14 0440  HGB 8.7*  HCT 29.4*  WBC 9.6  PLT 209     Recent Labs  11/12/14 0845 11/13/14 0440  NA  --  135  K 3.7 4.7  BUN  --  15  CREATININE  --  0.98  GLUCOSE  --  147*     Assessment/Plan: 1 Day Post-Op Procedure(s) (LRB): BILATERAL ANTERIOR TOTAL HIP ARTHROPLASTY (Bilateral) Foley cath maintained Advance diet Up with therapy D/C IV fluids Discharge home with home health / SNF depending on PT progression and ability to care for at home.  Obese (BMI 30-39.9) Estimated body mass index is 39.97 kg/(m^2) as calculated from the following:   Height as of this encounter: 5\' 4"  (1.626 m).   Weight as of this encounter: 105.688 kg (233 lb). Patient also counseled that weight may inhibit the healing process Patient counseled that losing weight will help with future health issues       West Pugh. Ether Goebel   PAC  11/13/2014, 8:35 AM

## 2014-11-13 NOTE — Progress Notes (Signed)
Physical Therapy Treatment Patient Details Name: [name] MRN: [id number] DOB: Nov 08, 1945 Today's Date: 11/13/2014    History of Present Illness s/p bil THA, DA;    PT Comments    Pt ltd this pm by lethargy/fatigue/nausea.  Follow Up Recommendations  Home health PT;SNF     Equipment Recommendations  None recommended by PT    Recommendations for Other Services OT consult     Precautions / Restrictions Precautions Precautions: Fall Restrictions Weight Bearing Restrictions: No Other Position/Activity Restrictions: WBAT    Mobility  Bed Mobility Overal bed mobility: Needs Assistance;+2 for physical assistance Bed Mobility: Sit to Supine     Supine to sit: Mod assist;+2 for physical assistance Sit to supine: +2 for physical assistance;Max assist;+2 for safety/equipment   General bed mobility comments: assist for legs and trunk; cues for sequence  Transfers Overall transfer level: Needs assistance Equipment used: Rolling walker (2 wheeled) Transfers: Sit to/from Stand Sit to Stand: +2 physical assistance;Max assist         General transfer comment: cues for LE management and use of UEs to self assist.  Increased assist required to bring pt to feet and to steady  Ambulation/Gait Ambulation/Gait assistance: Mod assist;+2 physical assistance;+2 safety/equipment Ambulation Distance (Feet): 1 Feet Assistive device: Rolling walker (2 wheeled) Gait Pattern/deviations: Step-to pattern;Decreased step length - right;Decreased step length - left;Shuffle;Wide base of support Gait velocity: decr   General Gait Details: cues for posture, position from RW and sequence.  Pt able to complete one step fwd and then froze in place.  Unable to follow cues for posture and increased UE WB to self assist.  Bed pulled in behind pt and  pt transferred to bed max assist    Stairs            Wheelchair Mobility    Modified Rankin (Stroke Patients Only)       Balance                                     Cognition Arousal/Alertness: Lethargic;Suspect due to medications Behavior During Therapy: [facility] for tasks assessed/performed Overall Cognitive Status: Impaired/Different from baseline Area of Impairment: Following commands;Problem solving       Following Commands: Follows one step commands inconsistently     Problem Solving: Slow processing General Comments: Pt with difficulty following simple cues for posture, position from RW, sequence and increased WB on UEs    Exercises Total Joint Exercises Ankle Circles/Pumps: AROM;Both;15 reps;Supine Quad Sets: AROM;Both;10 reps;Supine Heel Slides: AAROM;Both;15 reps;Supine Hip ABduction/ADduction: AAROM;Both;10 reps;Supine    General Comments        Pertinent Vitals/Pain Pain Assessment: Faces Faces Pain Scale: Hurts even more Pain Location: knees and hips Pain Intervention(s): Limited activity within patient's tolerance;Monitored during session;Premedicated before session;Ice applied    Home Living Family/patient expects to be discharged to:: Private residence Living Arrangements: Spouse/significant other Available Help at Discharge: Family;Available 24 hours/day Type of Home: House Home Access: Ramped entrance   Home Layout: One level Home Equipment: Walker - 2 wheels;Walker - 4 wheels;Bedside commode;Wheelchair - manual;Shower seat      Prior Function Level of Independence: Needs assistance  Gait / Transfers Assistance Needed: husband assists with gait with RW.  Pt spends most of her day in bed due to pain.   ADL's / Homemaking Assistance Needed: Husband assists.  Pt is able to preform her own peri care, but he helps bathe,  dress, mobilize her.   Comments: husband assisted with LB adls and transfers; used w/c   PT Goals (current goals can now be found in the care plan section) Acute Rehab PT Goals Patient Stated Goal: walk; play with grandchild PT Goal Formulation: With  patient Time For Goal Achievement: 11/20/14 Potential to Achieve Goals: Fair Progress towards PT goals: Not progressing toward goals - comment (lethargic, nauseous)    Frequency  7X/week    PT Plan Current plan remains appropriate    Co-evaluation PT/OT/SLP Co-Evaluation/Treatment: Yes Reason for Co-Treatment: For patient/therapist safety PT goals addressed during session: Mobility/safety with mobility OT goals addressed during session: ADL's and self-care     End of Session Equipment Utilized During Treatment: Gait belt Activity Tolerance: Patient limited by fatigue;Patient limited by lethargy;Other (comment);Patient limited by pain (nausea) Patient left: in bed;with call bell/phone within reach     Time: 1339-1357 PT Time Calculation (min) (ACUTE ONLY): 18 min  Charges:  $Therapeutic Exercise: 8-22 mins $Therapeutic Activity: 8-22 mins                    G Codes:      Pearle Wandler 2014/12/05, 2:55 PM

## 2014-11-13 NOTE — Evaluation (Signed)
Occupational Therapy Evaluation Patient Details Name: [name] MRN: [id number] DOB: 08/15/1945 Today's Date: 11/13/2014    History of Present Illness s/p bil THA, DA   Clinical Impression   This 69 year old female was admitted for the above surgeries.  She will benefit from skilled OT to increase safety and independence with adls.  Pt had assistance from husband prior to admission.  Will focus on use of reacher to increase independence with adls, standing for grooming and bathroom transfers.    Follow Up Recommendations  Supervision/Assistance - 24 hour    Equipment Recommendations  None recommended by OT    Recommendations for Other Services       Precautions / Restrictions Precautions Precautions: Fall Restrictions Weight Bearing Restrictions: No      Mobility Bed Mobility Overal bed mobility: Needs Assistance;+2 for physical assistance Bed Mobility: Supine to Sit     Supine to sit: Mod assist;+2 for physical assistance     General bed mobility comments: assist for legs and trunk; cues for sequence  Transfers Overall transfer level: Needs assistance Equipment used: Rolling walker (2 wheeled) Transfers: Sit to/from Stand Sit to Stand: Mod assist;+2 physical assistance         General transfer comment: from elevated bed; cues for UE/LE placement; assist to rise and steady    Balance                                            ADL Overall ADL's : Needs assistance/impaired                         Toilet Transfer: Moderate assistance;+2 for physical assistance;+2 for safety/equipment;RW (to recliner)             General ADL Comments: pt is able to perform UB adls with set up.  Husband has been assisting pt with LB adls.  She has a Secondary school teacher at home which she uses to retrieve items that fall.  Will review adls uses, if pt is interested in this. Pt very anxious during session; cues for breathing for relaxation      Vision     Perception     Praxis      Pertinent Vitals/Pain Pain Assessment: Faces Faces Pain Scale: Hurts even more Pain Location: knees with weight bearing Pain Intervention(s): Limited activity within patient's tolerance;Monitored during session;Premedicated before session;Repositioned;Ice applied     Hand Dominance     Extremity/Trunk Assessment Upper Extremity Assessment Upper Extremity Assessment: Overall WFL for tasks assessed           Communication Communication Communication: No difficulties   Cognition Arousal/Alertness: Awake/alert Behavior During Therapy: WFL for tasks assessed/performed Overall Cognitive Status: Impaired/Different from baseline Area of Impairment: Following commands;Problem solving       Following Commands: Follows one step commands with increased time     Problem Solving: Slow processing     General Comments       Exercises       Shoulder Instructions      Home Living Family/patient expects to be discharged to:: Private residence Living Arrangements: Spouse/significant other                 Bathroom Shower/Tub: Occupational psychologist: Standard     Home Equipment: Environmental consultant - 2 wheels;Walker - 4 wheels;Bedside commode;Wheelchair - manual;Shower  seat          Prior Functioning/Environment Level of Independence: Needs assistance        Comments: husband assisted with LB adls and transfers; used w/c    OT Diagnosis: Generalized weakness;Acute pain   OT Problem List: Decreased strength;Decreased activity tolerance;Pain;Decreased knowledge of use of DME or AE   OT Treatment/Interventions: Self-care/ADL training;DME and/or AE instruction;Patient/family education    OT Goals(Current goals can be found in the care plan section) Acute Rehab OT Goals Patient Stated Goal: walk; play with grandchild OT Goal Formulation: With patient Time For Goal Achievement: 11/20/14 Potential to Achieve Goals:  Good ADL Goals Pt Will Perform Grooming: with min guard assist;standing Pt Will Transfer to Toilet: with min assist;bedside commode;stand pivot transfer;ambulating Pt Will Perform Tub/Shower Transfer:  (vs verbalize sequence)  OT Frequency: Min 2X/week   Barriers to D/C:            Co-evaluation PT/OT/SLP Co-Evaluation/Treatment: Yes Reason for Co-Treatment: For patient/therapist safety PT goals addressed during session: Mobility/safety with mobility OT goals addressed during session: ADL's and self-care      End of Session    Activity Tolerance:  (limited by anxiety) Patient left: in chair;with call bell/phone within reach   Time: 8675-4492 OT Time Calculation (min): 27 min Charges:  OT General Charges $OT Visit: 1 Procedure OT Evaluation $Initial OT Evaluation Tier I: 1 Procedure G-Codes:    Archila,Edin Skarda 28-Nov-2014, 11:29 AM  Lesle Chris, OTR/L [phone] 28-Nov-2014

## 2014-11-13 NOTE — Evaluation (Signed)
Physical Therapy Evaluation Patient Details Name: KAITLYNNE WENZ MRN: [id number] DOB: 1945/11/30 Today's Date: 11/13/2014   History of Present Illness  s/p bil THA, DA;  Clinical Impression  Pt s/p bil THR presents with decreased bil LE strength/ROM, post op pain, obesity and elevated anxiety level limiting functional mobility.  Pt hopes to progress to dc home with HHPT but may require follow up rehab at SNF level dependent on acute stay progress.    Follow Up Recommendations Home health PT;SNF (dependent on acute stay progress)    Equipment Recommendations  None recommended by PT    Recommendations for Other Services OT consult     Precautions / Restrictions Precautions Precautions: Fall Restrictions Weight Bearing Restrictions: No      Mobility  Bed Mobility Overal bed mobility: Needs Assistance;+2 for physical assistance Bed Mobility: Supine to Sit     Supine to sit: Mod assist;+2 for physical assistance     General bed mobility comments: assist for legs and trunk; cues for sequence  Transfers Overall transfer level: Needs assistance Equipment used: Rolling walker (2 wheeled) Transfers: Sit to/from Stand Sit to Stand: Mod assist;+2 physical assistance;From elevated surface         General transfer comment: from elevated bed; cues for UE/LE placement; assist to rise and steady  Ambulation/Gait Ambulation/Gait assistance: Mod assist;+2 physical assistance;+2 safety/equipment Ambulation Distance (Feet): 3 Feet Assistive device: Rolling walker (2 wheeled) Gait Pattern/deviations: Step-to pattern;Decreased step length - right;Decreased step length - left;Shuffle;Trunk flexed Gait velocity: decr   General Gait Details: cues for posture, sequence, position from RW.  Pt limited by increasing anxiety  Stairs            Wheelchair Mobility    Modified Rankin (Stroke Patients Only)       Balance                                              Pertinent Vitals/Pain Pain Assessment: Faces Faces Pain Scale: Hurts even more Pain Location: knees with WB Pain Intervention(s): Limited activity within patient's tolerance;Monitored during session;Premedicated before session;Ice applied    Home Living Family/patient expects to be discharged to:: Private residence Living Arrangements: Spouse/significant other Available Help at Discharge: Family;Available 24 hours/day Type of Home: House Home Access: Ramped entrance     Home Layout: One level Home Equipment: Walker - 2 wheels;Walker - 4 wheels;Bedside commode;Wheelchair - manual;Shower seat      Prior Function Level of Independence: Needs assistance   Gait / Transfers Assistance Needed: husband assists with gait with RW.  Pt spends most of her day in bed due to pain.    ADL's / Homemaking Assistance Needed: Husband assists.  Pt is able to preform her own peri care, but he helps bathe, dress, mobilize her.    Comments: husband assisted with LB adls and transfers; used w/c     Hand Dominance   Dominant Hand: Right    Extremity/Trunk Assessment   Upper Extremity Assessment: Overall WFL for tasks assessed           Lower Extremity Assessment: RLE deficits/detail;LLE deficits/detail RLE Deficits / Details: strength at hip 2+/5 with AAROM at hip to 75 flex and 15 abd LLE Deficits / Details: strength at hip 2+/5 with AAROM at hip to 75 flex and 15abd  Cervical / Trunk Assessment: Kyphotic  Communication   Communication: No difficulties  Cognition Arousal/Alertness: Awake/alert Behavior During Therapy: WFL for tasks assessed/performed Overall Cognitive Status: Impaired/Different from baseline Area of Impairment: Following commands;Problem solving       Following Commands: Follows one step commands with increased time     Problem Solving: Slow processing      General Comments      Exercises Total Joint Exercises Ankle Circles/Pumps: AROM;Both;15  reps;Supine Quad Sets: AROM;Both;10 reps;Supine Heel Slides: AAROM;Both;15 reps;Supine Hip ABduction/ADduction: AAROM;Both;10 reps;Supine      Assessment/Plan    PT Assessment Patient needs continued PT services  PT Diagnosis Difficulty walking   PT Problem List Decreased strength;Decreased range of motion;Decreased activity tolerance;Decreased mobility;Decreased knowledge of use of DME;Decreased knowledge of precautions;Obesity;Pain  PT Treatment Interventions DME instruction;Gait training;Functional mobility training;Therapeutic activities;Therapeutic exercise;Patient/family education   PT Goals (Current goals can be found in the Care Plan section) Acute Rehab PT Goals Patient Stated Goal: walk; play with grandchild PT Goal Formulation: With patient Time For Goal Achievement: 11/20/14 Potential to Achieve Goals: Fair    Frequency 7X/week   Barriers to discharge        Co-evaluation PT/OT/SLP Co-Evaluation/Treatment: Yes Reason for Co-Treatment: For patient/therapist safety PT goals addressed during session: Mobility/safety with mobility OT goals addressed during session: ADL's and self-care       End of Session Equipment Utilized During Treatment: Gait belt Activity Tolerance: Other (comment);Patient tolerated treatment well (anxiety) Patient left: in chair;with call bell/phone within reach;with family/visitor present Nurse Communication: Mobility status         Time: 7471-5953 PT Time Calculation (min) (ACUTE ONLY): 38 min   Charges:   PT Evaluation $Initial PT Evaluation Tier I: 1 Procedure PT Treatments $Therapeutic Exercise: 8-22 mins   PT G Codes:        Carrson Lightcap 2014/11/27, 12:38 PM

## 2014-11-13 NOTE — Plan of Care (Signed)
Problem: Consults Goal: Diagnosis- Total Joint Replacement Outcome: Progressing Primary Total Hip     

## 2014-11-14 LAB — CBC
HCT: 23.8 % — ABNORMAL LOW (ref 36.0–46.0)
Hemoglobin: 7.5 g/dL — ABNORMAL LOW (ref 12.0–15.0)
MCH: 26.2 pg (ref 26.0–34.0)
MCHC: 31.5 g/dL (ref 30.0–36.0)
MCV: 83.2 fL (ref 78.0–100.0)
PLATELETS: 128 10*3/uL — AB (ref 150–400)
RBC: 2.86 MIL/uL — AB (ref 3.87–5.11)
RDW: 18.2 % — ABNORMAL HIGH (ref 11.5–15.5)
WBC: 5.6 10*3/uL (ref 4.0–10.5)

## 2014-11-14 LAB — BASIC METABOLIC PANEL
ANION GAP: 4 — AB (ref 5–15)
BUN: 14 mg/dL (ref 6–20)
CO2: 25 mmol/L (ref 22–32)
Calcium: 8.7 mg/dL — ABNORMAL LOW (ref 8.9–10.3)
Chloride: 109 mmol/L (ref 101–111)
Creatinine, Ser: 0.69 mg/dL (ref 0.44–1.00)
GFR calc Af Amer: >60 mL/min (ref >60)
GLUCOSE: 146 mg/dL — AB (ref 65–99)
POTASSIUM: 4.4 mmol/L (ref 3.5–5.1)
SODIUM: 138 mmol/L (ref 135–145)

## 2014-11-14 NOTE — Plan of Care (Signed)
Problem: Consults Goal: Diagnosis- Total Joint Replacement Outcome: Completed/Met Date Met:  11/14/14 Primary Total Hip BILATERAL  Problem: Phase I Progression Outcomes Goal: Dangle or out of bed evening of surgery Outcome: Not Applicable Date Met:  50/87/19 Very poor movement.

## 2014-11-14 NOTE — Clinical Social Work Note (Signed)
Clinical Social Work Assessment  Patient Details  Name: [name] MRN: [id number] Date of Birth: [birth year]  Date of referral:  11/13/14               Reason for consult:  Facility Placement, Discharge Planning                Permission sought to share information with:  Chartered certified accountant granted to share information::  Yes, Verbal Permission Granted  Name::        Agency::     Relationship::     Contact Information:     Housing/Transportation Living arrangements for the past 2 months:  Single Family Home Source of Information:  Patient, Spouse Patient Interpreter Needed:  None Criminal Activity/Legal Involvement Pertinent to Current Situation/Hospitalization:  No - Comment as needed Significant Relationships:  Spouse Lives with:  Spouse Do you feel safe going back to the place where you live?  Yes Need for family participation in patient care:  Yes (Comment)  Care giving concerns:  No concerns reported at this time.   Social Worker assessment / plan:  Pt hospitalized on 11/12/14 for pre planned bilateral total hip arthroplasties. CSW met with pt/spouse to assist with d/c planning. Pt is hoping to return home at d/c with spouse ( 24/7 assistance ) and HHPT. PN reviewed. PT recommends HHPT vs ST Rehab depending on pt's progression. CSW has reviewed PT recommendations with pt / spouse. Pt / spouse are in agreement with this plan. SNF search initiated. CSW will continue to follow to assist with d/c planning, as needed. Employment status:  Retired Forensic scientist:  [employer] PT Recommendations:  [place], Home with [place] / Referral to community resources:  [place]  Patient/Family's Response to care: Pt / spouse are in agreement with d/c plan. Patient/Family's Understanding of and Emotional Response to Diagnosis, Current Treatment, and Prognosis:  Pt is hopeful she will progress well enough to return  home with HHPT. Pt is motivated to work with therapy.  Emotional Assessment Appearance:  Appears stated age Attitude/Demeanor/Rapport:  Other (cooperative) Affect (typically observed):  Pleasant, Calm Orientation:  Oriented to Self, Oriented to Place, Oriented to  Time, Oriented to Situation Alcohol / Substance use:  Not Applicable Psych involvement (Current and /or in the community):  No (Comment)  Discharge Needs  Concerns to be addressed:  Discharge Planning Concerns Readmission within the last 30 days:  No Current discharge risk:  None Barriers to Discharge:  No Barriers Identified   [name]   [phone] 11/14/2014, 10:31 AM

## 2014-11-14 NOTE — Clinical Social Work Placement (Signed)
   CLINICAL SOCIAL WORK PLACEMENT  NOTE  Date:  11/14/2014  Patient Details  Name: [name] MRN: [id number] Date of Birth: 1946-03-15  Clinical Social Work is seeking post-discharge placement for this patient at the [place] level of care (*CSW will initial, date and re-position this form in  chart as items are completed):  Yes   Patient/family provided with [facility] Work Department's list of facilities offering this level of care within the geographic area requested by the patient (or if unable, by the patient's family).  Yes   Patient/family informed of their freedom to choose among providers that offer the needed level of care, that participate in Medicare, Medicaid or managed care program needed by the patient, have an available bed and are willing to accept the patient.  Yes   Patient/family informed of [facility]'s ownership interest in [facility] and [facility], as well as of the fact that they are under no obligation to receive care at these facilities.  PASRR submitted to EDS on 11/13/14     PASRR number received on 11/13/14     Existing PASRR number confirmed on       FL2 transmitted to all facilities in geographic area requested by pt/family on 11/14/14     FL2 transmitted to all facilities within larger geographic area on       Patient informed that his/her managed care company has contracts with or will negotiate with certain facilities, including the following:            Patient/family informed of bed offers received.  Patient chooses bed at       Physician recommends and patient chooses bed at      Patient to be transferred to   on  .  Patient to be transferred to facility by       Patient family notified on   of transfer.  Name of family member notified:        PHYSICIAN       Additional Comment:    _______________________________________________ Luretha Rued, [place]  [phone]  11/14/2014,  10:49 AM

## 2014-11-14 NOTE — Progress Notes (Addendum)
     Subjective: 2 Days Post-Op Procedure(s) (LRB): BILATERAL ANTERIOR TOTAL HIP ARTHROPLASTY (Bilateral)   Patient reports pain as mild, just a little muscle pain with motion and only with initiation of motion.  We have discussed the long road to recovery, with focus on her severe weakness from inactivity from the pain she has been experiencing.   Objective:   VITALS:   Filed Vitals:   11/14/14  BP: 112/59  Pulse: 90  Temp: 98.9 F (37.2 C)   Resp: 18    Dorsiflexion/Plantar flexion intact Incision: dressing C/D/I No cellulitis present Compartment soft  LABS  Recent Labs  11/13/14 0440 11/14/14 0446  HGB 8.7* 7.5*  HCT 29.4* 23.8*  WBC 9.6 5.6  PLT 209 128*     Recent Labs  11/12/14 0845 11/13/14 0440 11/14/14 0446  NA  --  135 138  K 3.7 4.7 4.4  BUN  --  15 14  CREATININE  --  0.98 0.69  GLUCOSE  --  147* 146*     Assessment/Plan: 2 Days Post-Op Procedure(s) (LRB): BILATERAL ANTERIOR TOTAL HIP ARTHROPLASTY (Bilateral) Up with therapy Encouraged to work hard with PT. Discussed going to a facility instead of home to obtain some more PT to help regain strength in both legs.  Obese (BMI 30-39.9) Estimated body mass index is 39.97 kg/(m^2) as calculated from the following:   Height as of this encounter: 5\' 4"  (1.626 m).   Weight as of this encounter: 105.688 kg (233 lb). Patient also counseled that weight may inhibit the healing process Patient counseled that losing weight will help with future health issues        [place] P[place]. [name]   PAC  11/14/2014, 1:50 PM

## 2014-11-14 NOTE — Progress Notes (Addendum)
Physical Therapy Treatment Patient Details Name: [name] MRN: [id number] DOB: 05-04-1945 Today's Date: 11/14/2014    History of Present Illness s/p bil THA, DA;    PT Comments    Good progress with mobility vs yesterday.  Pt in much better spirits and with improved focus on task.  Follow Up Recommendations  Home health PT;SNF     Equipment Recommendations  None recommended by PT    Recommendations for Other Services OT consult     Precautions / Restrictions Precautions Precautions: Fall Restrictions Weight Bearing Restrictions: No Other Position/Activity Restrictions: WBAT    Mobility  Bed Mobility Overal bed mobility: Needs Assistance;+2 for physical assistance Bed Mobility: Sit to Supine     Supine to sit: Mod assist;+2 for physical assistance     General bed mobility comments: assist for legs and trunk; cues for sequence  Transfers Overall transfer level: Needs assistance Equipment used: Rolling walker (2 wheeled) Transfers: Sit to/from Stand Sit to Stand: Min assist;+2 physical assistance;From elevated surface         General transfer comment: multimodal cues for UE/LE placement and extra time  Ambulation/Gait Ambulation/Gait assistance: Min assist;Mod assist;+2 physical assistance;+2 safety/equipment Ambulation Distance (Feet): 10 Feet Assistive device: Rolling walker (2 wheeled) Gait Pattern/deviations: Step-to pattern;Decreased step length - right;Decreased step length - left;Shuffle;Trunk flexed Gait velocity: decr   General Gait Details: cues for posture, position from RW and sequence  Stairs            Wheelchair Mobility    Modified Rankin (Stroke Patients Only)       Balance Overall balance assessment: Needs assistance Sitting-balance support: No upper extremity supported Sitting balance-Leahy Scale: Fair     Standing balance support: Bilateral upper extremity supported Standing balance-Leahy Scale: Poor                       Cognition Arousal/Alertness: Awake/alert Behavior During Therapy: WFL for tasks assessed/performed Overall Cognitive Status: Impaired/Different from baseline                 General Comments: extra time to follow commands/process    Exercises Total Joint Exercises Ankle Circles/Pumps: AROM;Both;15 reps;Supine Quad Sets: AROM;Both;10 reps;Supine Heel Slides: AAROM;Both;Supine;20 reps Hip ABduction/ADduction: AAROM;Both;Supine;15 reps    General Comments        Pertinent Vitals/Pain Pain Assessment: Faces Faces Pain Scale: Hurts a little bit Pain Location: knees.hips Pain Descriptors / Indicators: Tightness Pain Intervention(s): Limited activity within patient's tolerance;Monitored during session;Ice applied;Premedicated before session    Home Living                      Prior Function            PT Goals (current goals can now be found in the care plan section) Acute Rehab PT Goals Patient Stated Goal: walk; play with grandchild PT Goal Formulation: With patient Time For Goal Achievement: 11/20/14 Potential to Achieve Goals: Fair Progress towards PT goals: Progressing toward goals    Frequency  7X/week    PT Plan Current plan remains appropriate    Co-evaluation PT/OT/SLP Co-Evaluation/Treatment: Yes Reason for Co-Treatment: For patient/therapist safety PT goals addressed during session: Mobility/safety with mobility OT goals addressed during session: ADL's and self-care     End of Session Equipment Utilized During Treatment: Gait belt Activity Tolerance: Patient tolerated treatment well Patient left: in chair;with call bell/phone within reach;with family/visitor present     Time: 1023-1110 PT Time Calculation (min) (ACUTE  ONLY): 47 min  Charges:  $Gait Training: 8-22 mins $Therapeutic Exercise: 8-22 mins                    G Codes:      Rael Yo November 23, 2014, 2:30 PM

## 2014-11-14 NOTE — Progress Notes (Signed)
Occupational Therapy Treatment Patient Details Name: [name] MRN: [id number] DOB: 1945/11/12 Today's Date: 11/14/2014    History of present illness s/p bil THA, DA;   OT comments  Pt did better today; still with limited activity tolerance  Follow Up Recommendations  24 hour supervision and HHOT vs SNF, depending upon progress   Equipment Recommendations  None recommended by OT    Recommendations for Other Services      Precautions / Restrictions Precautions Precautions: Fall Restrictions Other Position/Activity Restrictions: WBAT       Mobility Bed Mobility   Bed Mobility: Sit to Supine     Supine to sit: Mod assist;+2 for physical assistance        Transfers   Equipment used: Rolling walker (2 wheeled) Transfers: Sit to/from Stand Sit to Stand: Min assist;+2 physical assistance;From elevated surface         General transfer comment: multimodal cues for UE/LE placement and extra time    Balance                                   ADL                           Toilet Transfer: Minimal assistance;+2 for safety/equipment;BSC;RW             General ADL Comments: SPT to 3:1.  Pt has catheter, practiced prior to this coming out.  Pt also ambulated to door with cues:  less anxious but still breathing heavier than baseline      Vision                     Perception     Praxis      Cognition   Behavior During Therapy: [facility] for tasks assessed/performed Overall Cognitive Status: Impaired/Different from baseline                  General Comments: extra time to follow commands/process    Extremity/Trunk Assessment               Exercises     Shoulder Instructions       General Comments      Pertinent Vitals/ Pain       Pain Assessment: Faces Faces Pain Scale: Hurts a little bit Pain Location: knees/hips Pain Intervention(s): Limited activity within patient's tolerance;Monitored during  session;Premedicated before session;Repositioned  Home Living                                          Prior Functioning/Environment              Frequency Min 2X/week     Progress Toward Goals  OT Goals(current goals can now be found in the care plan section)  Progress towards OT goals: Progressing toward goals     Plan Discharge plan needs to be updated    Co-evaluation    PT/OT/SLP Co-Evaluation/Treatment: Yes Reason for Co-Treatment: For patient/therapist safety PT goals addressed during session: Mobility/safety with mobility OT goals addressed during session: ADL's and self-care      End of Session     Activity Tolerance Patient tolerated treatment well   Patient Left in chair;with call bell/phone within reach   Nurse Communication  Time: 2130-8657 OT Time Calculation (min): 34 min  Charges: OT General Charges $OT Visit: 1 Procedure OT Treatments $Self Care/Home Management : 8-22 mins  Belger,Pati Thinnes 11/14/2014, 11:45 AM Lesle Chris, OTR/L [phone] 11/14/2014

## 2014-11-14 NOTE — Progress Notes (Signed)
Physical Therapy Treatment Patient Details Name: [name] MRN: [id number] DOB: 07/01/1945 Today's Date: 11/14/2014    History of Present Illness s/p bil THA, DA;    PT Comments    Continued steady progress with mobility.  Follow Up Recommendations  Home health PT;SNF     Equipment Recommendations  None recommended by PT    Recommendations for Other Services OT consult     Precautions / Restrictions Precautions Precautions: Fall Restrictions Weight Bearing Restrictions: No Other Position/Activity Restrictions: WBAT    Mobility  Bed Mobility Overal bed mobility: Needs Assistance;+2 for physical assistance Bed Mobility: Sit to Supine     Supine to sit: Mod assist;+2 for physical assistance Sit to supine: Mod assist;+2 for physical assistance;+2 for safety/equipment   General bed mobility comments: assist for legs and trunk; cues for sequence  Transfers Overall transfer level: Needs assistance Equipment used: Rolling walker (2 wheeled) Transfers: Sit to/from Stand Sit to Stand: +2 physical assistance;+2 safety/equipment;Min assist;Mod assist         General transfer comment: multimodal cues for UE/LE placement and extra time  Ambulation/Gait Ambulation/Gait assistance: Min assist;Mod assist;+2 physical assistance;+2 safety/equipment Ambulation Distance (Feet): 16 Feet Assistive device: Rolling walker (2 wheeled) Gait Pattern/deviations: Step-to pattern;Decreased step length - right;Decreased step length - left;Shuffle;Trunk flexed Gait velocity: decr   General Gait Details: cues for posture, position from RW and sequence   Stairs            Wheelchair Mobility    Modified Rankin (Stroke Patients Only)       Balance Overall balance assessment: Needs assistance Sitting-balance support: No upper extremity supported Sitting balance-Leahy Scale: Fair     Standing balance support: Bilateral upper extremity supported Standing  balance-Leahy Scale: Poor                      Cognition Arousal/Alertness: Awake/alert Behavior During Therapy: WFL for tasks assessed/performed Overall Cognitive Status: Impaired/Different from baseline Area of Impairment: Following commands;Problem solving       Following Commands: Follows one step commands consistently     Problem Solving: Slow processing General Comments: extra time to follow commands/process    Exercises Total Joint Exercises Ankle Circles/Pumps: AROM;Both;15 reps;Supine Quad Sets: AROM;Both;10 reps;Supine Heel Slides: AAROM;Both;Supine;20 reps Hip ABduction/ADduction: AAROM;Both;Supine;15 reps    General Comments        Pertinent Vitals/Pain Pain Assessment: Faces Faces Pain Scale: Hurts little more Pain Location: knees and hips Pain Descriptors / Indicators: Aching;Sore Pain Intervention(s): Limited activity within patient's tolerance;Monitored during session;Premedicated before session;Ice applied    Home Living                      Prior Function            PT Goals (current goals can now be found in the care plan section) Acute Rehab PT Goals Patient Stated Goal: walk; play with grandchild PT Goal Formulation: With patient Time For Goal Achievement: 11/20/14 Potential to Achieve Goals: Fair Progress towards PT goals: Progressing toward goals    Frequency  7X/week    PT Plan Current plan remains appropriate    Co-evaluation PT/OT/SLP Co-Evaluation/Treatment: Yes Reason for Co-Treatment: For patient/therapist safety PT goals addressed during session: Mobility/safety with mobility OT goals addressed during session: ADL's and self-care     End of Session Equipment Utilized During Treatment: Gait belt Activity Tolerance: Patient tolerated treatment well Patient left: in bed;with call bell/phone within reach;with family/visitor present  Time: 5747-3403 PT Time Calculation (min) (ACUTE ONLY): 23  min  Charges:  $Gait Training: 23-37 mins $Therapeutic Exercise: 8-22 mins                    G Codes:      Lashala Laser 2014/12/06, 2:36 PM

## 2014-11-14 NOTE — Care Management Note (Signed)
Case Management Note  Patient Details  Name: [name] MRN: [id number] Date of Birth: 09-05-1945  Subjective/Objective:                   BILATERAL ANTERIOR TOTAL HIP ARTHROPLASTY (Bilateral) Action/Plan:  Discharge planning Expected Discharge Date:                  Expected Discharge Plan:  [place]  In-House Referral:     Discharge planning Services  CM Consult  Post Acute Care Choice:    Choice offered to:     DME Arranged:    DME Agency:     HH Arranged:    [place] Agency:     Status of Service:  Completed, signed off  Medicare Important Message Given:    Date Medicare IM Given:    Medicare IM give by:    Date Additional Medicare IM Given:    Additional Medicare Important Message give by:     If discussed at [place] of Stay Meetings, dates discussed:    Additional Comments: CM notes pt to go to SNF for rehab; CSW arranging.  No other CM needs were communicated. Dellie Catholic, RN 11/14/2014, 12:28 PM

## 2014-11-14 NOTE — Care Management Important Message (Signed)
Important Message  Patient Details  Name: [name] MRN: [id number] Date of Birth: [birth year]   Medicare Important Message Given:  [facility] notification given    Camillo Flaming 11/14/2014, 1:00 Esmeralda Message  Patient Details  Name: [name] MRN: [id number] Date of Birth: Jun 21, 1945   Medicare Important Message Given:  Yes-second notification given    Camillo Flaming 11/14/2014, 1:00 PM

## 2014-11-15 DIAGNOSIS — M25552 Pain in left hip: Secondary | ICD-10-CM | POA: Diagnosis not present

## 2014-11-15 DIAGNOSIS — F419 Anxiety disorder, unspecified: Secondary | ICD-10-CM | POA: Diagnosis not present

## 2014-11-15 DIAGNOSIS — M1612 Unilateral primary osteoarthritis, left hip: Secondary | ICD-10-CM | POA: Diagnosis not present

## 2014-11-15 DIAGNOSIS — R195 Other fecal abnormalities: Secondary | ICD-10-CM | POA: Diagnosis not present

## 2014-11-15 DIAGNOSIS — I10 Essential (primary) hypertension: Secondary | ICD-10-CM | POA: Diagnosis not present

## 2014-11-15 DIAGNOSIS — D62 Acute posthemorrhagic anemia: Secondary | ICD-10-CM | POA: Diagnosis not present

## 2014-11-15 DIAGNOSIS — F329 Major depressive disorder, single episode, unspecified: Secondary | ICD-10-CM | POA: Diagnosis not present

## 2014-11-15 DIAGNOSIS — R2681 Unsteadiness on feet: Secondary | ICD-10-CM | POA: Diagnosis not present

## 2014-11-15 DIAGNOSIS — M25559 Pain in unspecified hip: Secondary | ICD-10-CM | POA: Diagnosis not present

## 2014-11-15 DIAGNOSIS — M6281 Muscle weakness (generalized): Secondary | ICD-10-CM | POA: Diagnosis not present

## 2014-11-15 DIAGNOSIS — K5900 Constipation, unspecified: Secondary | ICD-10-CM | POA: Diagnosis not present

## 2014-11-15 DIAGNOSIS — Z96642 Presence of left artificial hip joint: Secondary | ICD-10-CM | POA: Diagnosis not present

## 2014-11-15 DIAGNOSIS — R3915 Urgency of urination: Secondary | ICD-10-CM | POA: Diagnosis not present

## 2014-11-15 DIAGNOSIS — M160 Bilateral primary osteoarthritis of hip: Secondary | ICD-10-CM | POA: Diagnosis not present

## 2014-11-15 DIAGNOSIS — I480 Paroxysmal atrial fibrillation: Secondary | ICD-10-CM | POA: Diagnosis not present

## 2014-11-15 DIAGNOSIS — F411 Generalized anxiety disorder: Secondary | ICD-10-CM | POA: Diagnosis not present

## 2014-11-15 DIAGNOSIS — Z96643 Presence of artificial hip joint, bilateral: Secondary | ICD-10-CM | POA: Diagnosis not present

## 2014-11-15 DIAGNOSIS — R300 Dysuria: Secondary | ICD-10-CM | POA: Diagnosis not present

## 2014-11-15 DIAGNOSIS — G4730 Sleep apnea, unspecified: Secondary | ICD-10-CM | POA: Diagnosis not present

## 2014-11-15 DIAGNOSIS — R278 Other lack of coordination: Secondary | ICD-10-CM | POA: Diagnosis not present

## 2014-11-15 DIAGNOSIS — Z96641 Presence of right artificial hip joint: Secondary | ICD-10-CM | POA: Diagnosis not present

## 2014-11-15 DIAGNOSIS — J45998 Other asthma: Secondary | ICD-10-CM | POA: Diagnosis not present

## 2014-11-15 DIAGNOSIS — K219 Gastro-esophageal reflux disease without esophagitis: Secondary | ICD-10-CM | POA: Diagnosis not present

## 2014-11-15 DIAGNOSIS — Z471 Aftercare following joint replacement surgery: Secondary | ICD-10-CM | POA: Diagnosis not present

## 2014-11-15 LAB — CBC
HCT: 22.2 % — ABNORMAL LOW (ref 36.0–46.0)
HEMOGLOBIN: 6.9 g/dL — AB (ref 12.0–15.0)
MCH: 25.8 pg — AB (ref 26.0–34.0)
MCHC: 31.1 g/dL (ref 30.0–36.0)
MCV: 83.1 fL (ref 78.0–100.0)
PLATELETS: 128 10*3/uL — AB (ref 150–400)
RBC: 2.67 MIL/uL — AB (ref 3.87–5.11)
RDW: 18.5 % — ABNORMAL HIGH (ref 11.5–15.5)
WBC: 5.8 10*3/uL (ref 4.0–10.5)

## 2014-11-15 LAB — PREPARE RBC (CROSSMATCH)

## 2014-11-15 MED ORDER — TIZANIDINE HCL 4 MG PO TABS: 4.0000 mg | ORAL_TABLET | Freq: Four times a day (QID) | ORAL | Status: DC | PRN

## 2014-11-15 MED ORDER — POLYETHYLENE GLYCOL 3350 17 G PO PACK: 17.0000 g | PACK | Freq: Two times a day (BID) | ORAL | Status: DC

## 2014-11-15 MED ORDER — TRAMADOL HCL 50 MG PO TABS: 50.0000 mg | ORAL_TABLET | Freq: Four times a day (QID) | ORAL | Status: DC | PRN

## 2014-11-15 MED ORDER — DOCUSATE SODIUM 100 MG PO CAPS: 100.0000 mg | ORAL_CAPSULE | Freq: Two times a day (BID) | ORAL | Status: DC

## 2014-11-15 MED ORDER — SODIUM CHLORIDE 0.9 % IV SOLN
Freq: Once | INTRAVENOUS | Status: AC
  Administered 2014-11-15: 10:00:00 via INTRAVENOUS

## 2014-11-15 MED ORDER — FERROUS SULFATE 325 (65 FE) MG PO TABS: 325.0000 mg | ORAL_TABLET | Freq: Three times a day (TID) | ORAL | Status: DC

## 2014-11-15 NOTE — Progress Notes (Signed)
CRITICAL VALUE ALERT  Critical value received:hgb 6.9  Date of notification:  11/15/2014  Time of notification:  0625  Critical value read back:yes  Nurse who received alert: E. Carlton Adam, RN  MD notified (1st page):  Rosario Adie, PA  Time of first page: [phone]  MD notified (2nd page):  Time of second page:  Responding MD:  Gwenith Daily  Time MD responded:  [phone]

## 2014-11-15 NOTE — Progress Notes (Signed)
Pt d/c to [facility]. Report given to Maudie Mercury, RN at the facility.

## 2014-11-15 NOTE — Discharge Instructions (Signed)

## 2014-11-15 NOTE — Clinical Social Work Placement (Signed)
   CLINICAL SOCIAL WORK PLACEMENT  NOTE  Date:  11/15/2014  Patient Details  Name: [name] MRN: [id number] Date of Birth: 24-Jul-1945  Clinical Social Work is seeking post-discharge placement for this patient at the [place] level of care (*CSW will initial, date and re-position this form in  chart as items are completed):  Yes   Patient/family provided with [facility] Work Department's list of facilities offering this level of care within the geographic area requested by the patient (or if unable, by the patient's family).  Yes   Patient/family informed of their freedom to choose among providers that offer the needed level of care, that participate in Medicare, Medicaid or managed care program needed by the patient, have an available bed and are willing to accept the patient.  Yes   Patient/family informed of [facility] ownership interest in [facility] and [facility], as well as of the fact that they are under no obligation to receive care at these facilities.  PASRR submitted to EDS on 11/13/14     PASRR number received on 11/13/14     Existing PASRR number confirmed on       FL2 transmitted to all facilities in geographic area requested by pt/family on 11/14/14     FL2 transmitted to all facilities within larger geographic area on       Patient informed that his/her managed care company has contracts with or will negotiate with certain facilities, including the following:        Yes   Patient/family informed of bed offers received.  Patient chooses bed at [facility]     Physician recommends and patient chooses bed at      Patient to be transferred to [facility] on 11/15/14.  Patient to be transferred to facility by [facility]     Patient family notified on 11/15/14 of transfer.  Name of family member notified:  SPOUSE     PHYSICIAN       Additional Comment: Pt / spouse are in agreement with d/c to [facility]  today. [facility] transport required. Pt / spouse are aware out of pocket costs may be associated with [facility] transport. NSG reviewed d/c summary, scripts, avs. Scripts included in d/c packet. D/C summary sent to SNF prior to d/c for review.   _______________________________________________ Luretha Rued, Walker  [phone] 11/15/2014, 4:20 PM

## 2014-11-15 NOTE — Progress Notes (Addendum)
     Subjective: 3 Days Post-Op Procedure(s) (LRB): BILATERAL ANTERIOR TOTAL HIP ARTHROPLASTY (Bilateral)   Patient reports pain as mild, pain controlled. Feeling some weakness, but otherwise no events.  We discussed giving her blood. She is ok with it and states that she has had to do this after previous surgeries previously.  Ready to be discharged to skilled nursing facility after receiving 1 unit of blood.   Objective:   VITALS:   Filed Vitals:   11/15/14 1049  BP: 116/52  Pulse: 96  Temp: 98.2 F (36.8 C)  Resp: 14    Dorsiflexion/Plantar flexion intact Incision: dressing C/D/I No cellulitis present Compartment soft  LABS  Recent Labs  11/13/14 0440 11/14/14 0446 11/15/14 0542  HGB 8.7* 7.5* 6.9*  HCT 29.4* 23.8* 22.2*  WBC 9.6 5.6 5.8  PLT 209 128* 128*     Recent Labs  11/13/14 0440 11/14/14 0446  NA 135 138  K 4.7 4.4  BUN 15 14  CREATININE 0.98 0.69  GLUCOSE 147* 146*     Assessment/Plan: 3 Days Post-Op Procedure(s) (LRB): BILATERAL ANTERIOR TOTAL HIP ARTHROPLASTY (Bilateral) Up with therapy Discharge to SNF  Follow up in 2 weeks at [facility] Follow up with OLIN,Jailyn Leeson D in 2 weeks.  Contact information:  [facility] [address], [place] [phone]     Obese (BMI 30-39.9) Estimated body mass index is 39.97 kg/(m^2) as calculated from the following:   Height as of this encounter: 5\' 4"  (1.626 m).   Weight as of this encounter: 105.688 kg (233 lb). Patient also counseled that weight may inhibit the healing process Patient counseled that losing weight will help with future health issues  ABLA Patient to receive 1 unit of blood prior to leaving the hospital. She will continue to use iron as well to treat the ABLA.        Obese (BMI 30-39.9) Estimated body mass index is 39.97 kg/(m^2) as calculated from the following:  Height as of this encounter: 5\' 4"   (1.626 m).  Weight as of this encounter: 105.688 kg (233 lb). Patient also counseled that weight may inhibit the healing process Patient counseled that losing weight will help with future health issues          West Pugh. Shianna Bally   PAC  11/15/2014, 12:07 PM

## 2014-11-15 NOTE — Discharge Summary (Addendum)
Physician Discharge Summary  Patient ID: [name] MRN: [id number] DOB/AGE: February 06, 1946 69 y.o.  Admit date: 11/12/2014 Discharge date:  11/15/2014  Procedures:  Procedure(s) (LRB): BILATERAL ANTERIOR TOTAL HIP ARTHROPLASTY (Bilateral)  Attending Physician:  Dr. Paralee Cancel   Admission Diagnoses:   Bilateral hip primary OA /pain  Discharge Diagnoses:  Principal Problem:   S/P bilateral THA, AA Active Problems:   Obese   Acute blood loss anemia  Past Medical History  Diagnosis Date  . Asthma   . Arthritis   . Hypertension   . Unstable angina 09/09/2011  . HTN (hypertension) 09/09/2011  . Hyperlipemia 09/09/2011  . GERD (gastroesophageal reflux disease) 09/09/2011  . Anxiety   . Diverticulosis   . Antral polyp     benign  . IBS (irritable bowel syndrome)   . Obesity   . Internal hemorrhoid   . Vocal cord dysfunction 2013    swelling  . Shortness of breath   . Tachycardia   . Paroxysmal atrial fibrillation   . PONV (postoperative nausea and vomiting)     with big surgeries, none with endos etc, with colonoscopy- 5-6 years ago could not swallow   . Sleep apnea     borderline per patient no cpap   . History of transfusion   . Depression     HPI:    [name], 69 y.o. female, has a history of pain and functional disability in the bilateral hip(s) due to arthritis and patient has failed non-surgical conservative treatments for greater than 12 weeks to include NSAID's and/or analgesics, corticosteriod injections, use of assistive devices and activity modification. Onset of symptoms was gradual starting 10+ months ago with rapidlly worsening course since that time.The patient noted no past surgery on the bilateral hip(s). Patient currently rates pain in the bilateral hip at 10 out of 10 with activity. Patient has night pain, worsening of pain with activity and weight bearing, trendelenberg gait, pain that interfers with activities of daily living and pain  with passive range of motion. Patient has evidence of periarticular osteophytes and joint space narrowing by imaging studies. This condition presents safety issues increasing the risk of falls. There is no current active infection. Risks, benefits and expectations were discussed with the patient. Risks including but not limited to the risk of anesthesia, blood clots, nerve damage, blood vessel damage, failure of the prosthesis, infection and up to and including death. Patient understand the risks, benefits and expectations and wishes to proceed with surgery.   PCP: [name], MD   Discharged Condition: good  Hospital Course:  Patient underwent the above stated procedure on 11/12/2014. Patient tolerated the procedure well and brought to the recovery room in good condition and subsequently to the floor.  POD #1 BP: 124/56 ; Pulse: 102 ; Temp: 98.4 F (36.9 C) ; Resp: 16 Patient reports pain as mild, pain controlled. She is nauseated and threw up this morning, she feels that the pain medication might be causing it.  Dorsiflexion/plantar flexion intact, incision: dressing C/D/I, no cellulitis present and compartment soft.   LABS  Basename    HGB  8.7  HCT  29.4   POD #2  BP: 112/59 ; Pulse: 97 ; Temp: 98.6 F (37 C) ; Resp: 18 Patient reports pain as mild, just a little muscle pain with motion and only with initiation of motion. We have discussed the long road to recovery, with focus on her severe weakness from inactivity from the pain she has been  experiencing.  Dorsiflexion/plantar flexion intact, incision: dressing C/D/I, no cellulitis present and compartment soft.   LABS  Basename    HGB  7.5  HCT  23.8   POD #3  BP: 116/52 ; Pulse: 96 ; Temp: 98.2 F (36.8 C) ; Resp: 14 Patient reports pain as mild, just a little muscle pain with motion and only with initiation of pain. She feels a little weak this morning and we discussed giving her blood. She is ok with it and states that  she has had to do this after previous surgeries. Dorsiflexion/plantar flexion intact, incision: dressing C/D/I, no cellulitis present and compartment soft.   LABS  Basename    HGB  6.9  HCT  22.2    Discharge Exam: General appearance: alert, cooperative and no distress Extremities: Homans sign is negative, no sign of DVT, no edema, redness or tenderness in the calves or thighs and no ulcers, gangrene or trophic changes  Disposition:    Skilled nursing facility with follow up in 2 weeks   Follow-up Information    Follow up with Mauri Pole, MD. Schedule an appointment as soon as possible for a visit in 2 weeks.   Specialty:  Orthopedic Surgery   Contact information:   [address] [place] [address] [phone]           Discharge Instructions    Call MD / Call 911    Complete by:  As directed   If you experience chest pain or shortness of breath, CALL 911 and be transported to the hospital emergency room.  If you develope a fever above 101 F, pus (white drainage) or increased drainage or redness at the wound, or calf pain, call your surgeon's office.     Change dressing    Complete by:  As directed   Maintain surgical dressing until follow up in the clinic. If the edges start to pull up, may reinforce with tape. If the dressing is no longer working, may remove and cover with gauze and tape, but must keep the area dry and clean.  Call with any questions or concerns.     Constipation Prevention    Complete by:  As directed   Drink plenty of fluids.  Prune juice may be helpful.  You may use a stool softener, such as Colace (over the counter) 100 mg twice a day.  Use MiraLax (over the counter) for constipation as needed.     Diet - low sodium heart healthy    Complete by:  As directed      Discharge instructions    Complete by:  As directed   Maintain surgical dressing until follow up in the clinic. If the edges start to pull up, may reinforce with tape.  If the dressing is no longer working, may remove and cover with gauze and tape, but must keep the area dry and clean.  Follow up in 2 weeks at [facility]. Call with any questions or concerns.     Increase activity slowly as tolerated    Complete by:  As directed   Weight bearing as tolerated with assist device (walker, cane, etc) as directed, use it as long as suggested by your surgeon or therapist, typically at least 4-6 weeks.     TED hose    Complete by:  As directed   Use stockings (TED hose) for 2 weeks on both leg(s).  You may remove them at night for sleeping.  Medication List    STOP taking these medications        Calcium Citrate-Vitamin D 500-500 MG-UNIT Chew     diclofenac 75 MG EC tablet  Commonly known as:  VOLTAREN     ibuprofen 600 MG tablet  Commonly known as:  ADVIL,MOTRIN     levalbuterol 0.63 MG/3ML nebulizer solution  Commonly known as:  XOPENEX     NONFORMULARY OR COMPOUNDED ITEM     traMADol 50 MG tablet  Commonly known as:  ULTRAM      TAKE these medications        acetaminophen 500 MG tablet  Commonly known as:  TYLENOL  Take 1,000 mg by mouth 3 (three) times daily as needed for moderate pain or headache.     albuterol 108 (90 BASE) MCG/ACT inhaler  Commonly known as:  PROVENTIL HFA;VENTOLIN HFA  Inhale 1-2 puffs into the lungs every 6 (six) hours as needed for wheezing or shortness of breath.     apixaban 5 MG Tabs tablet  Commonly known as:  ELIQUIS  Take 1 tablet (5 mg total) by mouth 2 (two) times daily.     budesonide 0.5 MG/2ML nebulizer solution  Commonly known as:  PULMICORT  Take 2 mLs (0.5 mg total) by nebulization 2 (two) times daily.     cloNIDine 0.1 MG tablet  Commonly known as:  CATAPRES  Take 0.1 mg by mouth 3 (three) times daily.     clorazepate 7.5 MG tablet  Commonly known as:  TRANXENE  Take 7.5 mg by mouth 3 (three) times daily as needed for anxiety.     diltiazem 180 MG 24 hr capsule    Commonly known as:  CARDIZEM CD  Take 1 capsule (180 mg total) by mouth daily.     docusate sodium 100 MG capsule  Commonly known as:  COLACE  Take 1 capsule (100 mg total) by mouth 2 (two) times daily.     DULoxetine 60 MG capsule  Commonly known as:  CYMBALTA  Take 1 capsule by mouth daily with supper.     ferrous sulfate 325 (65 FE) MG tablet  Take 1 tablet (325 mg total) by mouth 3 (three) times daily after meals.     flintstones complete 60 MG chewable tablet  Chew 1 tablet by mouth daily.     losartan 50 MG tablet  Commonly known as:  COZAAR  Take 50 mg by mouth daily.     pantoprazole 40 MG tablet  Commonly known as:  PROTONIX  TAKE 1 TABLET TWICE DAILY     polyethylene glycol packet  Commonly known as:  MIRALAX / GLYCOLAX  Take 17 g by mouth 2 (two) times daily.     RESTORA Caps  Take 1 capsule by mouth daily.     tiZANidine 4 MG tablet  Commonly known as:  ZANAFLEX  Take 1 tablet (4 mg total) by mouth every 6 (six) hours as needed for muscle spasms.     vitamin B-12 1000 MCG tablet  Commonly known as:  CYANOCOBALAMIN  Take 1,000 mcg by mouth daily.         Signed: West Pugh. Jemina Scahill   PA-C  12/04/2014, 1:54 PM

## 2014-11-15 NOTE — Progress Notes (Signed)
Physical Therapy Treatment Patient Details Name: SOCORRO KANITZ MRN: [id number] DOB: 1945-09-18 Today's Date: 12-15-2014    History of Present Illness s/p bil THA, DA;    PT Comments    Pt performed therex with assist but OOB deferred with Hgb @ 6.9 and blood transfusion in progress.  Follow Up Recommendations  Home health PT;SNF     Equipment Recommendations  None recommended by PT    Recommendations for Other Services OT consult     Precautions / Restrictions Precautions Precautions: Fall Restrictions Weight Bearing Restrictions: No Other Position/Activity Restrictions: WBAT    Mobility  Bed Mobility               General bed mobility comments: OOB deferred 2* Hgb @6 .9 with blood transfusion in progress  Transfers                    Ambulation/Gait                 Stairs            Wheelchair Mobility    Modified Rankin (Stroke Patients Only)       Balance                                    Cognition Arousal/Alertness: Awake/alert Behavior During Therapy: WFL for tasks assessed/performed Overall Cognitive Status: Within Functional Limits for tasks assessed         Following Commands: Follows one step commands consistently     Problem Solving: Slow processing General Comments: extra time to follow commands/process    Exercises Total Joint Exercises Ankle Circles/Pumps: AROM;Both;15 reps;Supine Quad Sets: AROM;Both;10 reps;Supine Gluteal Sets: AROM;Both;10 reps;Supine Heel Slides: AAROM;Both;Supine;20 reps Hip ABduction/ADduction: AAROM;Both;Supine;15 reps    General Comments        Pertinent Vitals/Pain Pain Assessment: 0-10 Pain Score: 3  Pain Location: hips Pain Descriptors / Indicators: Aching;Sore;Tightness Pain Intervention(s): Limited activity within patient's tolerance;Monitored during session;Premedicated before session;Ice applied    Home Living                       Prior Function            PT Goals (current goals can now be found in the care plan section) Acute Rehab PT Goals Patient Stated Goal: walk; play with grandchild PT Goal Formulation: With patient Time For Goal Achievement: 11/20/14 Potential to Achieve Goals: Fair Progress towards PT goals: Progressing toward goals    Frequency  7X/week    PT Plan Current plan remains appropriate    Co-evaluation             End of Session   Activity Tolerance: Patient tolerated treatment well Patient left: in bed;with call bell/phone within reach;with family/visitor present     Time: 4709-6283 PT Time Calculation (min) (ACUTE ONLY): 23 min  Charges:  $Therapeutic Exercise: 8-22 mins                    G Codes:      Sharilyn [facility] 12-15-2014, 1:05 PM

## 2014-11-17 LAB — TYPE AND SCREEN
ABO/RH(D): A POS
ANTIBODY SCREEN: NEGATIVE
Unit division: 0

## 2014-11-18 ENCOUNTER — Encounter: Payer: Self-pay | Admitting: Adult Health

## 2014-11-18 ENCOUNTER — Non-Acute Institutional Stay (SKILLED_NURSING_FACILITY): Payer: Medicare Other | Admitting: Adult Health

## 2014-11-18 DIAGNOSIS — D62 Acute posthemorrhagic anemia: Secondary | ICD-10-CM | POA: Diagnosis not present

## 2014-11-18 DIAGNOSIS — I480 Paroxysmal atrial fibrillation: Secondary | ICD-10-CM | POA: Diagnosis not present

## 2014-11-18 DIAGNOSIS — M160 Bilateral primary osteoarthritis of hip: Secondary | ICD-10-CM | POA: Diagnosis not present

## 2014-11-18 DIAGNOSIS — F329 Major depressive disorder, single episode, unspecified: Secondary | ICD-10-CM

## 2014-11-18 DIAGNOSIS — I10 Essential (primary) hypertension: Secondary | ICD-10-CM

## 2014-11-18 DIAGNOSIS — F419 Anxiety disorder, unspecified: Secondary | ICD-10-CM | POA: Diagnosis not present

## 2014-11-18 DIAGNOSIS — J45998 Other asthma: Secondary | ICD-10-CM

## 2014-11-18 DIAGNOSIS — K5900 Constipation, unspecified: Secondary | ICD-10-CM | POA: Diagnosis not present

## 2014-11-18 DIAGNOSIS — K219 Gastro-esophageal reflux disease without esophagitis: Secondary | ICD-10-CM | POA: Diagnosis not present

## 2014-11-18 DIAGNOSIS — F32A Depression, unspecified: Secondary | ICD-10-CM

## 2014-11-18 NOTE — Progress Notes (Signed)
Patient ID: [name], female   DOB: Mar 03, 1946, 69 y.o.   MRN: [id number]    DATE:  11/18/2014 MRN:  [id number]  BIRTHDAY: Oct 03, 1945  Facility:  [facility]ursing Home Location:  [place] Room Number: 1007-P  LEVEL OF CARE:  SNF (31)  Contact Information    Name Seminary A Spouse [phone]  [phone]       Chief Complaint  Patient presents with  . Hospitalization Follow-up    Osteoarthritis S/P bilateral total hip arthroplasty, asthma, hypertension, anxiety, PAF, constipation, depression, anemia and GERD    HISTORY OF PRESENT ILLNESS:  This is a 69 year old female who has been admitted to [facility] on 11/15/14 from [facility]. He has PMH of asthma, hypertension, arthritis, hyperlipidemia, GERD, anxiety, IBS, PAF, sleep apnea and depression. He has bilateral hip osteoarthritis for which he had bilateral total hip arthroplasty, anterior approach 11/12/14.  She has been admitted for a short-term rehabilitation.  PAST MEDICAL HISTORY:  Past Medical History  Diagnosis Date  . Asthma   . Arthritis   . Hypertension   . Unstable angina 09/09/2011  . HTN (hypertension) 09/09/2011  . Hyperlipemia 09/09/2011  . GERD (gastroesophageal reflux disease) 09/09/2011  . Anxiety   . Diverticulosis   . Antral polyp     benign  . IBS (irritable bowel syndrome)   . Obesity   . Internal hemorrhoid   . Vocal cord dysfunction 2013    swelling  . Shortness of breath   . Tachycardia   . Paroxysmal atrial fibrillation   . PONV (postoperative nausea and vomiting)     with big surgeries, none with endos etc, with colonoscopy- 5-6 years ago could not swallow   . Sleep apnea     borderline per patient no cpap   . History of transfusion   . Depression      CURRENT MEDICATIONS: Reviewed  Patient's Medications  New Prescriptions   No medications on file  Previous Medications   ACETAMINOPHEN (TYLENOL) 500 MG  TABLET    Take 1,000 mg by mouth 3 (three) times daily as needed for moderate pain or headache.    ALBUTEROL (PROVENTIL HFA;VENTOLIN HFA) 108 (90 BASE) MCG/ACT INHALER    Inhale 1-2 puffs into the lungs every 6 (six) hours as needed for wheezing or shortness of breath.   APIXABAN (ELIQUIS) 5 MG TABS TABLET    Take 1 tablet (5 mg total) by mouth 2 (two) times daily.   BUDESONIDE (PULMICORT) 0.5 MG/2ML NEBULIZER SOLUTION    Take 2 mLs (0.5 mg total) by nebulization 2 (two) times daily.   CALCIUM CITRATE-VITAMIN D 500-500 MG-UNIT CHEW    Chew 1 tablet by mouth 3 (three) times daily.   CLONIDINE (CATAPRES) 0.1 MG TABLET    Take 0.1 mg by mouth 3 (three) times daily.   CLORAZEPATE (TRANXENE) 7.5 MG TABLET    Take 7.5 mg by mouth 3 (three) times daily as needed for anxiety.   DILTIAZEM (CARDIZEM CD) 180 MG 24 HR CAPSULE    Take 1 capsule (180 mg total) by mouth daily.   DOCUSATE SODIUM (COLACE) 100 MG CAPSULE    Take 1 capsule (100 mg total) by mouth 2 (two) times daily.   DULOXETINE (CYMBALTA) 60 MG CAPSULE    Take 1 capsule by mouth daily with supper.   FERROUS SULFATE 325 (65 FE) MG TABLET    Take 1 tablet (325 mg total) by mouth  3 (three) times daily after meals.   FLINTSTONES COMPLETE (FLINTSTONES) 60 MG CHEWABLE TABLET    Chew 1 tablet by mouth daily.   LEVALBUTEROL (XOPENEX) 0.63 MG/3ML NEBULIZER SOLUTION    Take 0.63 mg by nebulization every 4 (four) hours as needed for wheezing or shortness of breath.   LOSARTAN (COZAAR) 50 MG TABLET    Take 50 mg by mouth daily.    PANTOPRAZOLE (PROTONIX) 40 MG TABLET    TAKE 1 TABLET TWICE DAILY   POLYETHYLENE GLYCOL (MIRALAX / GLYCOLAX) PACKET    Take 17 g by mouth 2 (two) times daily.   PROBIOTIC PRODUCT (RESTORA) CAPS    Take 1 capsule by mouth daily.   TIZANIDINE (ZANAFLEX) 4 MG TABLET    Take 1 tablet (4 mg total) by mouth every 6 (six) hours as needed for muscle spasms.   TRAMADOL (ULTRAM) 50 MG TABLET    Take 1-2 tablets (50-100 mg total) by mouth every  6 (six) hours as needed for moderate pain or severe pain.   VITAMIN B-12 (CYANOCOBALAMIN) 1000 MCG TABLET    Take 1,000 mcg by mouth daily.  Modified Medications   No medications on file  Discontinued Medications   No medications on file     Allergies  Allergen Reactions  . Ativan [Lorazepam] Other (See Comments)    Agitation, aggressive actions, significant mental changes  . Fentanyl Itching  . Morphine And Related Itching and Rash     REVIEW OF SYSTEMS:  GENERAL: no change in appetite, no fatigue, no weight changes, no fever, chills or weakness EYES: Denies change in vision, dry eyes, eye pain, itching or discharge EARS: Denies change in hearing, ringing in ears, or earache NOSE: Denies nasal congestion or epistaxis MOUTH and THROAT: Denies oral discomfort, gingival pain or bleeding, pain from teeth or hoarseness   RESPIRATORY: no cough, SOB, DOE, wheezing, hemoptysis CARDIAC: no chest pain, edema or palpitations GI: no abdominal pain, diarrhea, constipation, heart burn, nausea or vomiting GU: Denies dysuria, frequency, hematuria, incontinence, or discharge PSYCHIATRIC: Denies feeling of depression or anxiety. No report of hallucinations, insomnia, paranoia, or agitation   PHYSICAL EXAMINATION  GENERAL APPEARANCE: Well nourished. In no acute distress. Obese SKIN: bilateral hip surgical incision is covered with aquacel dressing, dry and no erythema HEAD: Normal in size and contour. No evidence of trauma EYES: Lids open and close normally. No blepharitis, entropion or ectropion. PERRL. Conjunctivae are clear and sclerae are white. Lenses are without opacity EARS: Pinnae are normal. Patient hears normal voice tunes of the examiner MOUTH and THROAT: Lips are without lesions. Oral mucosa is moist and without lesions. Tongue is normal in shape, size, and color and without lesions NECK: supple, trachea midline, no neck masses, no thyroid tenderness, no thyromegaly LYMPHATICS: no  LAN in the neck, no supraclavicular LAN RESPIRATORY: breathing is even & unlabored, BS CTAB CARDIAC: RRR, no murmur,no extra heart sounds, no edema GI: abdomen soft, normal BS, no masses, no tenderness, no hepatomegaly, no splenomegaly EXTREMITIES:  Able to move 4 extremities PSYCHIATRIC: Alert and oriented X 3. Affect and behavior are appropriate  LABS/RADIOLOGY: Labs reviewed: Basic Metabolic Panel:  Recent Labs  10/13/14 1700  11/06/14 1420 11/12/14 0845 11/13/14 0440 11/14/14 0446  NA  --   < > 138  --  135 138  K  --   < > 3.2* 3.7 4.7 4.4  CL  --   < > 103  --  107 109  CO2  --   < >  23  --  23 25  GLUCOSE  --   < > 118*  --  147* 146*  BUN  --   < > 15  --  15 14  CREATININE  --   < > 0.95  --  0.98 0.69  CALCIUM  --   < > 9.5  --  8.9 8.7*  MG 1.9  --   --   --   --   --   < > = values in this interval not displayed. Liver Function Tests:  Recent Labs  09/29/14 2008 10/13/14 0755 10/14/14 0330  AST 18 18 17   ALT 16 18 15   ALKPHOS 87 89 79  BILITOT 0.4 0.4 0.3  PROT 6.3* 6.4* 5.8*  ALBUMIN 3.5 3.6 3.3*   CBC:  Recent Labs  08/23/14 0336 08/24/14 0440  10/13/14 0755  11/13/14 0440 11/14/14 0446 11/15/14 0542  WBC 4.7 4.8  < > 6.9  < > 9.6 5.6 5.8  NEUTROABS 3.2 3.1  --  4.8  --   --   --   --   HGB 8.9* 8.4*  < > 11.5*  < > 8.7* 7.5* 6.9*  HCT 30.3* 28.0*  < > 36.6  < > 29.4* 23.8* 22.2*  MCV 78.1 78.0  < > 77.2*  < > 82.8 83.2 83.1  PLT 107* 113*  < > 199  < > 209 128* 128*  < > = values in this interval not displayed.  Cardiac Enzymes:  Recent Labs  10/13/14 1700 10/13/14 2131 10/14/14 0330  TROPONINI <0.03 <0.03 <0.03   CBG:  Recent Labs  10/13/14 0747 10/14/14 2128  GLUCAP 128* 103*     Dg C-arm 61-120 Min-no Report  11/12/2014   CLINICAL DATA: hip pain   C-ARM 61-120 MINUTES  Fluoroscopy was utilized by the requesting physician.  No radiographic  interpretation.    Dg Hip Port Unilat With Pelvis 1v Left  11/12/2014    CLINICAL DATA:  Bilateral hip arthroplasties.  EXAM: DG HIP (WITH OR WITHOUT PELVIS) 1V PORT LEFT  COMPARISON:  CT 10/13/2014  FINDINGS: Bilateral total hip arthroplasties were performed. Bilateral femoral stems are visualized. No evidence for a hardware complication or periprosthetic fracture. Left hip arthroplasty is located on the cross-table lateral view.  IMPRESSION: Bilateral total hip arthroplasty.  No complicating features.   Electronically Signed   By: Markus Daft M.D.   On: 11/12/2014 15:46   Dg Hip Port Unilat With Pelvis 1v Right  11/12/2014   CLINICAL DATA:  Status post bilateral hip arthroplasty.  EXAM: DG HIP (WITH OR WITHOUT PELVIS) 1V PORT RIGHT  COMPARISON:  None.  FINDINGS: Right hip arthroplasty in satisfactory position on this single lateral view.  IMPRESSION: Right hip arthroplasty in satisfactory position on this single lateral view.   Electronically Signed   By: Julian Hy M.D.   On: 11/12/2014 15:46    ASSESSMENT/PLAN:  Osteoarthritis S/P right bilateral hip arthroplasty - for rehabilitation; continue tramadol 50 mg 1-2 tabs by mouth every 6 hours when necessary for pain; Zanaflex 4 mg 1 tab by mouth every 6 hours when necessary for muscle spasm; and Eliquis 5 mg 1 tab by mouth twice a day for DVT prophylaxis; follow-up with Dr. Alvan Dame, orthopedic surgeon, in 2 weeks  Asthma - continue albuterol inhaler 1-2 puffs into the lungs every 6 hours when necessary, Pulmicort 0.5 mg/2 mL neb twice a day and saphenous 0.63 mg/3 mL 1 neb every 4 hours when necessary  Hypertension - continue clonidine 0.1 mg 1 tab by mouth 3 times a day, Cardizem CD 180 mg 1 capsule by mouth daily and losartan 50 mg 1 tab by mouth daily; check BMP  Anxiety - mood this is stable; continue Tranxene 7.5 mg 1 tab by mouth 3 times a day when necessary  PAF - rate controlled; continue Cardizem CD 180 mg 1 capsule by mouth daily and Eliquis 5 mg 1 tab by mouth twice a day   Constipation - continue Colace  100 mg 1 capsule by mouth twice a day and MiraLAX 17 g by mouth twice a day  Depression - continue Cymbalta 60 mg 1 capsule by mouth daily  Anemia, acute blood loss - hemoglobin 6.9; S/P transfusion 1 unit packed RBC; continue ferrous sulfate 325 mg 1 tab by mouth 3 times a ; check CBC  GERD - continue Protonix 40 mg 1 tab by mouth twice a day    Goals of care:  Short-term rehabilitation    [place], NP [facility] [phone]

## 2014-11-21 ENCOUNTER — Non-Acute Institutional Stay (SKILLED_NURSING_FACILITY): Payer: Medicare Other | Admitting: Internal Medicine

## 2014-11-21 ENCOUNTER — Encounter: Payer: Self-pay | Admitting: Internal Medicine

## 2014-11-21 DIAGNOSIS — F32A Depression, unspecified: Secondary | ICD-10-CM

## 2014-11-21 DIAGNOSIS — D62 Acute posthemorrhagic anemia: Secondary | ICD-10-CM

## 2014-11-21 DIAGNOSIS — R2681 Unsteadiness on feet: Secondary | ICD-10-CM | POA: Diagnosis not present

## 2014-11-21 DIAGNOSIS — R195 Other fecal abnormalities: Secondary | ICD-10-CM

## 2014-11-21 DIAGNOSIS — J45998 Other asthma: Secondary | ICD-10-CM | POA: Diagnosis not present

## 2014-11-21 DIAGNOSIS — F329 Major depressive disorder, single episode, unspecified: Secondary | ICD-10-CM | POA: Diagnosis not present

## 2014-11-21 DIAGNOSIS — M160 Bilateral primary osteoarthritis of hip: Secondary | ICD-10-CM | POA: Diagnosis not present

## 2014-11-21 DIAGNOSIS — R300 Dysuria: Secondary | ICD-10-CM

## 2014-11-21 DIAGNOSIS — F411 Generalized anxiety disorder: Secondary | ICD-10-CM | POA: Diagnosis not present

## 2014-11-21 DIAGNOSIS — I10 Essential (primary) hypertension: Secondary | ICD-10-CM

## 2014-11-21 DIAGNOSIS — I480 Paroxysmal atrial fibrillation: Secondary | ICD-10-CM | POA: Diagnosis not present

## 2014-11-21 NOTE — Progress Notes (Signed)
Patient ID: [name], female   DOB: October 12, 1945, 69 y.o.   MRN: [id number]     [place]  PCP: [name], MD  Code Status: Full Code   Allergies  Allergen Reactions  . Ativan [Lorazepam] Other (See Comments)    Agitation, aggressive actions, significant mental changes  . Fentanyl Itching  . Morphine And Related Itching and Rash    Chief Complaint  Patient presents with  . New Admit To SNF    New Admission      HPI:  69 y.o. patient is here for short term rehabilitation post hospital admission for bilateral hip OA. She underwent bilateral total hip arthroplasty on 11/12/14. She has PMH of asthma, hypertension, arthritis, hyperlipidemia, GERD, anxiety, IBS, PAF, sleep apnea and depression. She is seen in her room today. She has been having strong odor to her urine and some dysuria and had a urine sample sent for study yesterday. She complaints of muscle spasm and pain. She has loose stool and would like her miralax dosing changes to only as needed.    Review of Systems:  Constitutional: Negative for fever, chills, diaphoresis.  HENT: Negative for headache, congestion, nasal discharge Eyes: Negative for eye pain, blurred vision, double vision and discharge.  Respiratory: Negative for cough, shortness of breath and wheezing.   Cardiovascular: Negative for chest pain, palpitations, leg swelling.  Gastrointestinal: Negative for heartburn, nausea, vomiting, abdominal pain Genitourinary: Negative for hematuria, flank pain.  Musculoskeletal: Negative for back pain, falls Skin: Negative for itching, rash.  Neurological: Negative for dizziness, tingling, focal weakness Psychiatric/Behavioral: Negative for depression   Past Medical History  Diagnosis Date  . Asthma   . Arthritis   . Hypertension   . Unstable angina 09/09/2011  . HTN (hypertension) 09/09/2011  . Hyperlipemia 09/09/2011  . GERD (gastroesophageal reflux disease) 09/09/2011  . Anxiety   .  Diverticulosis   . Antral polyp     benign  . IBS (irritable bowel syndrome)   . Obesity   . Internal hemorrhoid   . Vocal cord dysfunction 2013    swelling  . Shortness of breath   . Tachycardia   . Paroxysmal atrial fibrillation   . PONV (postoperative nausea and vomiting)     with big surgeries, none with endos etc, with colonoscopy- 5-6 years ago could not swallow   . Sleep apnea     borderline per patient no cpap   . History of transfusion   . Depression    Past Surgical History  Procedure Laterality Date  . Total knee arthroplasty Bilateral     left 2002, right 2012  . Cholecystectomy    . Abdominal hysterectomy  1988  . Tumor excision  1964    rt. leg fatty tumor  . Shoulder surgery Right 1987  . Wrist surgery Left 1997, 2009   . Elbow surgery Left     removed bone chip  . Foot osteotomy w/ plantar fascia release Left   . Total ankle replacement Right     x 3  . Hip ilioban release Right 1997  . Total ankle replacement Right 2012  . Ankle fusion Right     x 2  . Thumb replacement Bilateral     one 2009 and one 2014  . Back surgery  [id number]    3 ruptured discs, lower back  . Nasal sinus surgery  1997  . Esophagogastroduodenoscopy [name] 03/06/2013    Procedure: ESOPHAGOGASTRODUODENOSCOPY (EGD);  Surgeon: Inda Castle,  MD;  Location: [facility] ENDOSCOPY;  Service: Endoscopy;  Laterality: [name];  . Bravo ph study [name] 03/06/2013    Procedure: BRAVO [place] STUDY;  Surgeon: Inda Castle, MD;  Location: [facility] ENDOSCOPY;  Service: Endoscopy;  Laterality: [name];  . Tfc wrist Left 1997  . Left heart catheterization with coronary angiogram Bilateral 09/10/2011    Procedure: LEFT HEART CATHETERIZATION WITH CORONARY ANGIOGRAM;  Surgeon: Lorretta Harp, MD;  Location: [facility] CATH LAB;  Service: Cardiovascular;  Laterality: Bilateral;  . Laparoscopic gastric sleeve resection with hiatal hernia repair  08/20/2014    Procedure: LAPAROSCOPIC GASTRIC SLEEVE RESECTION WITH HIATAL HERNIA   REPAIR AND  UPPER ENDOSCOPY;  Surgeon: Johnathan Hausen, MD;  Location: [facility] ORS;  Service: General;;  . Upper gi endoscopy  08/20/2014    Procedure: UPPER GI ENDOSCOPY;  Surgeon: Johnathan Hausen, MD;  Location: [facility] ORS;  Service: General;;  . Cardiac catheterization  2013    NORMAL  . Transthoracic echocardiogram  09/09/2011    MILD CONCENTRIC LVH. TRACE MITRAL REGURG. TRACE TR. MILD AORTIC REGURG.  . Bilateral anterior total hip arthroplasty Bilateral 11/12/2014    Procedure: BILATERAL ANTERIOR TOTAL HIP ARTHROPLASTY;  Surgeon: Paralee Cancel, MD;  Location: [facility] ORS;  Service: Orthopedics;  Laterality: Bilateral;   Social History:   reports that she has never smoked. She has never used smokeless tobacco. She reports that she does not drink alcohol or use illicit drugs.  Family History  Problem Relation Age of Onset  . Hypertension Mother   . Diabetes Mother   . Arthritis Mother   . Esophageal cancer Mother   . Esophageal cancer Father 16  . Coronary artery disease Neg Hx   . Colon cancer Neg Hx   . Liver disease Neg Hx   . Kidney disease Neg Hx   . Other Sister     kidney mass removed  . Hypertension Sister   . Heart disease Maternal Grandmother   . Stroke Paternal Grandmother     Medications:   Medication List       This list is accurate as of: 11/21/14 11:30 AM.  Always use your most recent med list.               acetaminophen 500 MG tablet  Commonly known as:  TYLENOL  Take 1,000 mg by mouth 3 (three) times daily as needed for moderate pain or headache.     albuterol 108 (90 BASE) MCG/ACT inhaler  Commonly known as:  PROVENTIL HFA;VENTOLIN HFA  Inhale 1-2 puffs into the lungs every 6 (six) hours as needed for wheezing or shortness of breath.     albuterol 0.63 MG/3ML nebulizer solution  Commonly known as:  ACCUNEB  Take 1 ampule by nebulization every 4 (four) hours as needed for shortness of breath.     apixaban 5 MG Tabs tablet  Commonly known as:  ELIQUIS  Take 1  tablet (5 mg total) by mouth 2 (two) times daily.     budesonide 0.5 MG/2ML nebulizer solution  Commonly known as:  PULMICORT  Take 2 mLs (0.5 mg total) by nebulization 2 (two) times daily.     CALCIUM GUMMIES PO  Take by mouth 3 (three) times daily.     cloNIDine 0.1 MG tablet  Commonly known as:  CATAPRES  Take 0.1 mg by mouth 3 (three) times daily.     clorazepate 7.5 MG tablet  Commonly known as:  TRANXENE  Take 7.5 mg by mouth 3 (three) times daily as  needed for anxiety.     diltiazem 180 MG 24 hr capsule  Commonly known as:  CARDIZEM CD  Take 1 capsule (180 mg total) by mouth daily.     docusate sodium 100 MG capsule  Commonly known as:  COLACE  Take 1 capsule (100 mg total) by mouth 2 (two) times daily.     DULoxetine 60 MG capsule  Commonly known as:  CYMBALTA  Take 1 capsule by mouth daily with supper.     ferrous sulfate 325 (65 FE) MG tablet  Take 1 tablet (325 mg total) by mouth 3 (three) times daily after meals.     flintstones complete 60 MG chewable tablet  Chew 1 tablet by mouth daily.     losartan 50 MG tablet  Commonly known as:  COZAAR  Take 50 mg by mouth daily.     pantoprazole 40 MG tablet  Commonly known as:  PROTONIX  TAKE 1 TABLET TWICE DAILY     polyethylene glycol packet  Commonly known as:  MIRALAX / GLYCOLAX  Take 17 g by mouth 2 (two) times daily.     RESTORA Caps  Take 1 capsule by mouth daily.     tiZANidine 4 MG tablet  Commonly known as:  ZANAFLEX  Take 1 tablet (4 mg total) by mouth every 6 (six) hours as needed for muscle spasms.     traMADol 50 MG tablet  Commonly known as:  ULTRAM  Take 1-2 tablets (50-100 mg total) by mouth every 6 (six) hours as needed for moderate pain or severe pain.     vitamin B-12 1000 MCG tablet  Commonly known as:  CYANOCOBALAMIN  Take 1,000 mcg by mouth daily.         Physical Exam: Filed Vitals:   11/21/14 1119  BP: 92/72  Pulse: 114  Temp: 98.4 F (36.9 C)  TempSrc: Oral  Resp:  21  Height: 5\' 4"  (1.626 m)  Weight: 245 lb (111.131 kg)  SpO2: 96%    General- elderly female, in no acute distress Head- normocephalic, atraumatic Nose- normal nasal mucosa, no maxillary or frontal sinus tenderness, no nasal discharge Throat- moist mucus membrane Eyes- no pallor, no icterus, no discharge, normal conjunctiva, normal sclera Neck- no cervical lymphadenopathy Cardiovascular- normal s1,s2, no murmurs, palpable dorsalis pedis and radial pulses, trace leg edema Respiratory- bilateral clear to auscultation, no wheeze, no rhonchi, no crackles, no use of accessory muscles Abdomen- bowel sounds present, soft, non tender Musculoskeletal- able to move all 4 extremities, limited ROM to both legs at the hip and has unsteady gait  Neurological- no focal deficit, alert and oriented to person, place and time Skin- warm and dry, aquacel dressing to both hips Psychiatry- normal mood and affect    Labs reviewed: Basic Metabolic Panel:  Recent Labs  10/13/14 1700  11/06/14 1420 11/12/14 0845 11/13/14 0440 11/14/14 0446  NA  --   < > 138  --  135 138  K  --   < > 3.2* 3.7 4.7 4.4  CL  --   < > 103  --  107 109  CO2  --   < > 23  --  23 25  GLUCOSE  --   < > 118*  --  147* 146*  BUN  --   < > 15  --  15 14  CREATININE  --   < > 0.95  --  0.98 0.69  CALCIUM  --   < > 9.5  --  8.9 8.7*  MG 1.9  --   --   --   --   --   < > = values in this interval not displayed. Liver Function Tests:  Recent Labs  09/29/14 2008 10/13/14 0755 10/14/14 0330  AST 18 18 17   ALT 16 18 15   ALKPHOS 87 89 79  BILITOT 0.4 0.4 0.3  PROT 6.3* 6.4* 5.8*  ALBUMIN 3.5 3.6 3.3*   No results for input(s): LIPASE, AMYLASE in the last 8760 hours. No results for input(s): AMMONIA in the last 8760 hours. CBC:  Recent Labs  08/23/14 0336 08/24/14 0440  10/13/14 0755  11/13/14 0440 11/14/14 0446 11/15/14 0542  WBC 4.7 4.8  < > 6.9  < > 9.6 5.6 5.8  NEUTROABS 3.2 3.1  --  4.8  --   --   --    --   HGB 8.9* 8.4*  < > 11.5*  < > 8.7* 7.5* 6.9*  HCT 30.3* 28.0*  < > 36.6  < > 29.4* 23.8* 22.2*  MCV 78.1 78.0  < > 77.2*  < > 82.8 83.2 83.1  PLT 107* 113*  < > 199  < > 209 128* 128*  < > = values in this interval not displayed. Cardiac Enzymes:  Recent Labs  10/13/14 1700 10/13/14 2131 10/14/14 0330  TROPONINI <0.03 <0.03 <0.03   BNP: Invalid input(s): POCBNP CBG:  Recent Labs  10/13/14 0747 10/14/14 2128  GLUCAP 128* 103*    Assessment/Plan  Unsteady gait With surgical repair of both hips. Will have her work with physical therapy and occupational therapy team to help with gait training and muscle strengthening exercises.fall precautions. Skin care. Encourage to be out of bed.   Bilateral hip osteoarthritis  S/P bilateral hip arthroplasty. Has f/u with orthopedics. Change tramadol to 50 mg 1-2 tab q6h prn pain. Add robaxin 500 mg q6h prn muscle spasm. Will have patient work with PT/OT as tolerated to regain strength and restore function.  Fall precautions are in place. Continue Eliquis 5 mg bid for DVT prophylaxis.  Loose stool Likely from her miralax bid dosing, change this to daily as needed only for noew, check bmp  Dysuria Afebrile, no suprapubic tenderness on exam. F/u on culture report. Encourage hydration for now  Blood loss anemia S/p 1 u prbc transfusion in hospital. Continue feso4 325 mg tid for now and check cbc  HTN bp on soft side, asymptomatic. monitor bp for now and continue losartan 50 mg daily with clonidine 0.1 mg tid for now with holding parameters  afib Rate controlled. Continue cardizem cd 180 mg daily with eliquis for now  Asthma  Stable, continue pulmicort and her nebulizers  GAD mood this is stable. continue Tranxene 7.5 mg tid prn  Depression continue Cymbalta 60 mg daily   Goals of care: short term rehabilitation   Labs/tests ordered: cbc, bmp, u/a with c/s pending  Family/ staff Communication: reviewed care plan with  patient and nursing supervisor    Blanchie Serve, MD  [place] [phone] (Monday-Friday 8 am - 5 pm) [phone] (afterhours)

## 2014-11-22 ENCOUNTER — Other Ambulatory Visit: Payer: Self-pay

## 2014-11-22 MED ORDER — TRAMADOL HCL 50 MG PO TABS: ORAL_TABLET | ORAL | Status: DC

## 2014-11-22 NOTE — Telephone Encounter (Signed)
Rx faxed to [facility] @ [phone], phone number [phone]  

## 2014-11-26 DIAGNOSIS — Z471 Aftercare following joint replacement surgery: Secondary | ICD-10-CM | POA: Diagnosis not present

## 2014-11-26 DIAGNOSIS — Z96643 Presence of artificial hip joint, bilateral: Secondary | ICD-10-CM | POA: Diagnosis not present

## 2014-11-28 ENCOUNTER — Ambulatory Visit: Payer: Self-pay | Admitting: Dietician

## 2014-12-04 DIAGNOSIS — D62 Acute posthemorrhagic anemia: Secondary | ICD-10-CM | POA: Diagnosis not present

## 2014-12-05 ENCOUNTER — Encounter: Payer: Self-pay | Admitting: Adult Health

## 2014-12-05 ENCOUNTER — Non-Acute Institutional Stay (SKILLED_NURSING_FACILITY): Payer: Medicare Other | Admitting: Adult Health

## 2014-12-05 DIAGNOSIS — J45998 Other asthma: Secondary | ICD-10-CM

## 2014-12-05 DIAGNOSIS — F329 Major depressive disorder, single episode, unspecified: Secondary | ICD-10-CM | POA: Diagnosis not present

## 2014-12-05 DIAGNOSIS — K5900 Constipation, unspecified: Secondary | ICD-10-CM | POA: Diagnosis not present

## 2014-12-05 DIAGNOSIS — D62 Acute posthemorrhagic anemia: Secondary | ICD-10-CM | POA: Diagnosis not present

## 2014-12-05 DIAGNOSIS — M160 Bilateral primary osteoarthritis of hip: Secondary | ICD-10-CM | POA: Diagnosis not present

## 2014-12-05 DIAGNOSIS — F419 Anxiety disorder, unspecified: Secondary | ICD-10-CM

## 2014-12-05 DIAGNOSIS — I480 Paroxysmal atrial fibrillation: Secondary | ICD-10-CM | POA: Diagnosis not present

## 2014-12-05 DIAGNOSIS — F32A Depression, unspecified: Secondary | ICD-10-CM

## 2014-12-05 DIAGNOSIS — K219 Gastro-esophageal reflux disease without esophagitis: Secondary | ICD-10-CM

## 2014-12-05 NOTE — Progress Notes (Signed)
Patient ID: [name], female   DOB: 14-Oct-1945, 69 y.o.   MRN: [id number]    DATE:  12/05/2014 MRN:  [id number]  BIRTHDAY: [birth year]  Facility:  Nursing Home Location:  [place] Room Number: 1007-P  LEVEL OF CARE:  SNF [phone])  Contact Information    Name Relation Home Work [place] [phone]   Oliver Daughter   [phone]      Chief Complaint  Patient presents with  . Discharge Note    Osteoarthritis S/P bilateral total hip arthroplasty, asthma, hypertension, anxiety, PAF, constipation, depression, anemia and GERD    HISTORY OF PRESENT ILLNESS:  This is a 69 year old female who is for discharge home with outpatient rehabilitation. She has been admitted to [facility] on 11/15/14 from [facility]. He has PMH of asthma, hypertension, arthritis, hyperlipidemia, GERD, anxiety, IBS, PAF, sleep apnea and depression. He has bilateral hip osteoarthritis for which he had bilateral total hip arthroplasty, anterior approach 11/12/14.  Patient was admitted to this facility for short-term rehabilitation after the patient's recent hospitalization.  Patient has completed SNF rehabilitation and therapy has cleared the patient for discharge.  PAST MEDICAL HISTORY:  Past Medical History  Diagnosis Date  . Asthma   . Arthritis   . Hypertension   . Unstable angina 09/09/2011  . HTN (hypertension) 09/09/2011  . Hyperlipemia 09/09/2011  . GERD (gastroesophageal reflux disease) 09/09/2011  . Anxiety   . Diverticulosis   . Antral polyp     benign  . IBS (irritable bowel syndrome)   . Obesity   . Internal hemorrhoid   . Vocal cord dysfunction 2013    swelling  . Shortness of breath   . Tachycardia   . Paroxysmal atrial fibrillation   . PONV (postoperative nausea and vomiting)     with big surgeries, none with endos etc, with colonoscopy- 5-6 years ago could not swallow   . Sleep apnea       borderline per patient no cpap   . History of transfusion   . Depression      CURRENT MEDICATIONS: Reviewed     Medication List       This list is accurate as of: 12/05/14 10:56 PM.  Always use your most recent med list.               acetaminophen 500 MG tablet  Commonly known as:  TYLENOL  Take 1,000 mg by mouth 3 (three) times daily as needed for moderate pain or headache.     albuterol 108 (90 BASE) MCG/ACT inhaler  Commonly known as:  PROVENTIL HFA;VENTOLIN HFA  Inhale 1-2 puffs into the lungs every 6 (six) hours as needed for wheezing or shortness of breath.     albuterol 0.63 MG/3ML nebulizer solution  Commonly known as:  ACCUNEB  Take 1 ampule by nebulization every 4 (four) hours as needed for shortness of breath.     apixaban 5 MG Tabs tablet  Commonly known as:  ELIQUIS  Take 1 tablet (5 mg total) by mouth 2 (two) times daily.     budesonide 0.5 MG/2ML nebulizer solution  Commonly known as:  PULMICORT  Take 2 mLs (0.5 mg total) by nebulization 2 (two) times daily.     CALCIUM GUMMIES PO  Take by mouth 3 (three) times daily.     cloNIDine 0.1 MG tablet  Commonly known as:  CATAPRES  Take 0.1 mg by mouth 3 (  three) times daily as needed.     clorazepate 7.5 MG tablet  Commonly known as:  TRANXENE  Take 7.5 mg by mouth 3 (three) times daily as needed for anxiety.     diltiazem 180 MG 24 hr capsule  Commonly known as:  CARDIZEM CD  Take 1 capsule (180 mg total) by mouth daily.     docusate sodium 100 MG capsule  Commonly known as:  COLACE  Take 1 capsule (100 mg total) by mouth 2 (two) times daily.     DULoxetine 60 MG capsule  Commonly known as:  CYMBALTA  Take 1 capsule by mouth daily with supper.     losartan 50 MG tablet  Commonly known as:  COZAAR  Take 50 mg by mouth daily.     ondansetron 4 MG disintegrating tablet  Commonly known as:  ZOFRAN-ODT  Take 4 mg by mouth every 6 (six) hours as needed for nausea or vomiting.      pantoprazole 40 MG tablet  Commonly known as:  PROTONIX  TAKE 1 TABLET TWICE DAILY     polyethylene glycol packet  Commonly known as:  MIRALAX / GLYCOLAX  Take 17 g by mouth daily as needed.     RESTORA Caps  Take 1 capsule by mouth daily.     traMADol 50 MG tablet  Commonly known as:  ULTRAM  1 by mouth every 6 hours as needed for pain 1-5, 2 by mouth every 6 hours as needed for pain 6-10     vitamin B-12 1000 MCG tablet  Commonly known as:  CYANOCOBALAMIN  Take 1,000 mcg by mouth daily.          Allergies  Allergen Reactions  . Ativan [Lorazepam] Other (See Comments)    Agitation, aggressive actions, significant mental changes  . Fentanyl Itching  . Morphine And Related Itching and Rash     REVIEW OF SYSTEMS:  GENERAL: no change in appetite, no fatigue, no weight changes, no fever, chills or weakness EYES: Denies change in vision, dry eyes, eye pain, itching or discharge EARS: Denies change in hearing, ringing in ears, or earache NOSE: Denies nasal congestion or epistaxis MOUTH and THROAT: Denies oral discomfort, gingival pain or bleeding, pain from teeth or hoarseness   RESPIRATORY: no cough, SOB, DOE, wheezing, hemoptysis CARDIAC: no chest pain, edema or palpitations GI: no abdominal pain, diarrhea, constipation, heart burn, nausea or vomiting GU: Denies dysuria, frequency, hematuria, incontinence, or discharge PSYCHIATRIC: Denies feeling of depression or anxiety. No report of hallucinations, insomnia, paranoia, or agitation   PHYSICAL EXAMINATION  GENERAL APPEARANCE: Well nourished. In no acute distress. Obese SKIN: bilateral hip surgical incision is  dry and no erythema HEAD: Normal in size and contour. No evidence of trauma EYES: Lids open and close normally. No blepharitis, entropion or ectropion. PERRL. Conjunctivae are clear and sclerae are white. Lenses are without opacity EARS: Pinnae are normal. Patient hears normal voice tunes of the examiner MOUTH  and THROAT: Lips are without lesions. Oral mucosa is moist and without lesions. Tongue is normal in shape, size, and color and without lesions NECK: supple, trachea midline, no neck masses, no thyroid tenderness, no thyromegaly LYMPHATICS: no LAN in the neck, no supraclavicular LAN RESPIRATORY: breathing is even & unlabored, BS CTAB CARDIAC: RRR, no murmur,no extra heart sounds, no edema GI: abdomen soft, normal BS, no masses, no tenderness, no hepatomegaly, no splenomegaly EXTREMITIES:  Able to move 4 extremities PSYCHIATRIC: Alert and oriented X 3. Affect and behavior  are appropriate  LABS/RADIOLOGY: Labs reviewed: 11/19/14  WBC 6.3 hemoglobin 9.3 hematocrit 31.7 MCV 87.6 platelet 261 sodium 142 potassium 3.9 glucose 135 BUN 12 creatinine 0.68 calcium 8.6 Basic Metabolic Panel:  Recent Labs  10/13/14 1700  11/06/14 1420 11/12/14 0845 11/13/14 0440 11/14/14 0446  NA  --   < > 138  --  135 138  K  --   < > 3.2* 3.7 4.7 4.4  CL  --   < > 103  --  107 109  CO2  --   < > 23  --  23 25  GLUCOSE  --   < > 118*  --  147* 146*  BUN  --   < > 15  --  15 14  CREATININE  --   < > 0.95  --  0.98 0.69  CALCIUM  --   < > 9.5  --  8.9 8.7*  MG 1.9  --   --   --   --   --   < > = values in this interval not displayed. Liver Function Tests:  Recent Labs  09/29/14 2008 10/13/14 0755 10/14/14 0330  AST 18 18 17   ALT 16 18 15   ALKPHOS 87 89 79  BILITOT 0.4 0.4 0.3  PROT 6.3* 6.4* 5.8*  ALBUMIN 3.5 3.6 3.3*   CBC:  Recent Labs  08/23/14 0336 08/24/14 0440  10/13/14 0755  11/13/14 0440 11/14/14 0446 11/15/14 0542  WBC 4.7 4.8  < > 6.9  < > 9.6 5.6 5.8  NEUTROABS 3.2 3.1  --  4.8  --   --   --   --   HGB 8.9* 8.4*  < > 11.5*  < > 8.7* 7.5* 6.9*  HCT 30.3* 28.0*  < > 36.6  < > 29.4* 23.8* 22.2*  MCV 78.1 78.0  < > 77.2*  < > 82.8 83.2 83.1  PLT 107* 113*  < > 199  < > 209 128* 128*  < > = values in this interval not displayed.  Cardiac Enzymes:  Recent Labs  10/13/14 1700  10/13/14 2131 10/14/14 0330  TROPONINI <0.03 <0.03 <0.03   CBG:  Recent Labs  10/13/14 0747 10/14/14 2128  GLUCAP 128* 103*     Dg C-arm 61-120 Min-no Report  11/12/2014   CLINICAL DATA: hip pain   C-ARM 61-120 MINUTES  Fluoroscopy was utilized by the requesting physician.  No radiographic  interpretation.    Dg Hip Port Unilat With Pelvis 1v Left  11/12/2014   CLINICAL DATA:  Bilateral hip arthroplasties.  EXAM: DG HIP (WITH OR WITHOUT PELVIS) 1V PORT LEFT  COMPARISON:  CT 10/13/2014  FINDINGS: Bilateral total hip arthroplasties were performed. Bilateral femoral stems are visualized. No evidence for a hardware complication or periprosthetic fracture. Left hip arthroplasty is located on the cross-table lateral view.  IMPRESSION: Bilateral total hip arthroplasty.  No complicating features.   Electronically Signed   By: Markus Daft M.D.   On: 11/12/2014 15:46   Dg Hip Port Unilat With Pelvis 1v Right  11/12/2014   CLINICAL DATA:  Status post bilateral hip arthroplasty.  EXAM: DG HIP (WITH OR WITHOUT PELVIS) 1V PORT RIGHT  COMPARISON:  None.  FINDINGS: Right hip arthroplasty in satisfactory position on this single lateral view.  IMPRESSION: Right hip arthroplasty in satisfactory position on this single lateral view.   Electronically Signed   By: Julian Hy M.D.   On: 11/12/2014 15:46    ASSESSMENT/PLAN:  Osteoarthritis S/P  right bilateral hip arthroplasty - for outpatient rehabilitation; continue tramadol 50 mg 1-2 tabs by mouth every 6 hours when necessary for pain;  and Eliquis 5 mg 1 tab by mouth twice a day for DVT prophylaxis; follow-up with Dr. Alvan Dame, orthopedic surgeon  Asthma - continue albuterol inhaler 1-2 puffs into the lungs every 6 hours when necessary, Pulmicort 0.5 mg/2 mL neb twice a day and saphenous 0.63 mg/3 mL 1 neb every 4 hours when necessary  Hypertension - continue clonidine 0.1 mg 1 tab by mouth 3 times a day PRN, Cardizem CD 180 mg 1 capsule by mouth daily  and losartan 50 mg 1 tab by mouth daily  Anxiety - mood this is stable; continue Tranxene 7.5 mg 1 tab by mouth 3 times a day when necessary  PAF - rate controlled; continue Cardizem CD 180 mg 1 capsule by mouth daily and Eliquis 5 mg 1 tab by mouth twice a day   Constipation - continue Colace 100 mg 1 capsule by mouth twice a day and MiraLAX 17 g by mouth daily PRN  Depression - continue Cymbalta 60 mg 1 capsule by mouth daily  Anemia, acute blood loss - hemoglobin 9.3; S/P transfusion 1 unit packed RBC; ferrous sulfate was discontinued per patient and husband's request  GERD - continue Protonix 40 mg 1 tab by mouth twice a day      I have filled out patient's discharge paperwork and written prescriptions.  Patient will have outpatient rehabilitation.  Total discharge time: Less than 30 minutes  Discharge time involved coordination of the discharge process with Education officer, museum, nursing staff and therapy department.     [facility], NP [employer] [phone]

## 2014-12-09 DIAGNOSIS — M160 Bilateral primary osteoarthritis of hip: Secondary | ICD-10-CM | POA: Diagnosis not present

## 2014-12-11 DIAGNOSIS — M160 Bilateral primary osteoarthritis of hip: Secondary | ICD-10-CM | POA: Diagnosis not present

## 2014-12-23 DIAGNOSIS — M160 Bilateral primary osteoarthritis of hip: Secondary | ICD-10-CM | POA: Diagnosis not present

## 2014-12-25 DIAGNOSIS — Z96641 Presence of right artificial hip joint: Secondary | ICD-10-CM | POA: Diagnosis not present

## 2014-12-25 DIAGNOSIS — M160 Bilateral primary osteoarthritis of hip: Secondary | ICD-10-CM | POA: Diagnosis not present

## 2014-12-25 DIAGNOSIS — Z96642 Presence of left artificial hip joint: Secondary | ICD-10-CM | POA: Diagnosis not present

## 2014-12-25 DIAGNOSIS — Z471 Aftercare following joint replacement surgery: Secondary | ICD-10-CM | POA: Diagnosis not present

## 2014-12-26 DIAGNOSIS — Z903 Acquired absence of stomach [part of]: Secondary | ICD-10-CM | POA: Diagnosis not present

## 2014-12-30 DIAGNOSIS — M160 Bilateral primary osteoarthritis of hip: Secondary | ICD-10-CM | POA: Diagnosis not present

## 2015-01-01 DIAGNOSIS — M160 Bilateral primary osteoarthritis of hip: Secondary | ICD-10-CM | POA: Diagnosis not present

## 2015-01-06 DIAGNOSIS — M160 Bilateral primary osteoarthritis of hip: Secondary | ICD-10-CM | POA: Diagnosis not present

## 2015-01-07 ENCOUNTER — Telehealth: Payer: Self-pay | Admitting: Cardiovascular Disease

## 2015-01-07 NOTE — Telephone Encounter (Signed)
Spoke w/ patient. Eliquis costing $162 for 30-day supply - she cannot afford. Discussed possibility of switching meds or going to coumadin - informed her there may be things we can do, I would defer to [facility] to advise on best recommendations. Pt open to idea of going on coumadin, if a candidate. I did explain this requires routine checks in office. I forgot to mention discount programs for NOACs, but this may be option as well.  Routing to [place] for best recommendations.

## 2015-01-07 NOTE — Telephone Encounter (Signed)
[name] is calling to find out if there is something that is equivalent to Eliquis , but more cost effective . [name] Please Call  Thanks

## 2015-01-08 NOTE — Telephone Encounter (Signed)
See if she would qualify for patient assistance.  Has to have spent 3% of annual income on meds to qualify for free med remainder of 2016.  We can switch to warfarin, but would not recommend to do so only to go back to Eliquis in January.  She would have to be willing to be on long term warfarin.

## 2015-01-09 ENCOUNTER — Other Ambulatory Visit: Payer: Self-pay | Admitting: Surgery

## 2015-01-09 DIAGNOSIS — Z903 Acquired absence of stomach [part of]: Secondary | ICD-10-CM

## 2015-01-10 NOTE — Telephone Encounter (Signed)
Returned call to patient. Gave her website for [facility] patient assistance program.  Encouraged her to review this and see if she thinks she will be able to quality - she will call back once she's had time to review.

## 2015-01-13 DIAGNOSIS — M160 Bilateral primary osteoarthritis of hip: Secondary | ICD-10-CM | POA: Diagnosis not present

## 2015-01-14 ENCOUNTER — Other Ambulatory Visit: Payer: Self-pay | Admitting: Surgery

## 2015-01-14 ENCOUNTER — Ambulatory Visit
Admission: RE | Admit: 2015-01-14 | Discharge: 2015-01-14 | Disposition: A | Discharge status: Home / Self Care | Payer: Medicare Other | Source: Ambulatory Visit | Attending: Surgery | Admitting: Surgery

## 2015-01-14 DIAGNOSIS — Z903 Acquired absence of stomach [part of]: Secondary | ICD-10-CM

## 2015-01-14 DIAGNOSIS — K219 Gastro-esophageal reflux disease without esophagitis: Secondary | ICD-10-CM | POA: Diagnosis not present

## 2015-01-16 DIAGNOSIS — R300 Dysuria: Secondary | ICD-10-CM | POA: Diagnosis not present

## 2015-01-20 DIAGNOSIS — M160 Bilateral primary osteoarthritis of hip: Secondary | ICD-10-CM | POA: Diagnosis not present

## 2015-02-05 DIAGNOSIS — Z96641 Presence of right artificial hip joint: Secondary | ICD-10-CM | POA: Diagnosis not present

## 2015-02-05 DIAGNOSIS — Z96642 Presence of left artificial hip joint: Secondary | ICD-10-CM | POA: Diagnosis not present

## 2015-02-05 DIAGNOSIS — Z471 Aftercare following joint replacement surgery: Secondary | ICD-10-CM | POA: Diagnosis not present

## 2015-02-06 ENCOUNTER — Encounter: Payer: Self-pay | Admitting: Dietician

## 2015-02-06 ENCOUNTER — Encounter: Payer: Medicare Other | Attending: Surgery | Admitting: Dietician

## 2015-02-06 VITALS — Wt 227.8 lb

## 2015-02-06 DIAGNOSIS — Z713 Dietary counseling and surveillance: Secondary | ICD-10-CM | POA: Insufficient documentation

## 2015-02-06 DIAGNOSIS — Z6841 Body Mass Index (BMI) 40.0 and over, adult: Secondary | ICD-10-CM | POA: Insufficient documentation

## 2015-02-06 NOTE — Patient Instructions (Addendum)
Goals: Follow Phase 3B: High Protein + Non-Starchy Vegetables Eat 3-6 small meals/snacks, every 3-5 hrs Increase lean protein foods to meet 60g goal Increase fluid intake to 64oz + Avoid drinking 15 minutes before, during and 30 minutes after eating Aim for >30 min of physical activity daily Try protein like [place] sausage, try cottage cheese with salsa, chicken/egg salad, cheese/lunch meat roll up, Quest protein chips, white beans  Eat protein first and then vegetables then starch Try Citracal Petites (3x a day) Make an appointment with PCP for bloodwork (HgbA1c) Avoid fried foods Try having 1/2 a Premier protein bar for your afternoon snack

## 2015-02-06 NOTE — Progress Notes (Signed)
Follow-up visit:  6 months Post-Operative Sleeve Gastrectomy Surgery  Medical Nutrition Therapy:  Appt start time: [id number] end time: 1235  Primary concerns today: Post-operative Bariatric Surgery Nutrition Management. Returns having lost another 15 lbs. She recently had a hip replacement and was in a nursing home for rehab. States she "got off track" nutritionally in rehab. Having nausea in the mornings after breakfast that leads to hiccuping and then severe dry heaving. She does not have vomiting but may sometimes have diarrhea. Zofran and staying in bed helps heaving subside. Had a swallow test and per Dr. Excell Seltzer, this looked normal. However, she plans to follow up with Dr. Hassell Done to discuss this issue further. Patrizia states that this happens regardless of what she eats for breakfast but she thinks it happens more if she gets too hungry. Has not vomited at all since surgery. She reports she is getting more fluids in; she reports that she stays thirsty and has a dry mouth. Wakes up to use the bathroom about 3x during the night. She is working on eating regular meals and meeting protein needs by eating meats first. Sensitive to certain tastes.   Surgery date: 08/20/14 Surgery type: Gastric sleeve [facility] weight at [facility]: 278 lbs on 06/27/14 Weight today: 227.8 lbs Weight change: 15 lbs Total weight loss: 50 lbs  Preferred Learning Style:   No preference indicated   Learning Readiness:   Ready  24-hr recall: B (10 AM): 1 egg and grits OR steel cut oatmeal with 2% milk and raisins OR 1/2 a biscuit and sausage (7g) Snk (AM): usually none L (PM): leftovers OR grilled chicken wrap from [employer] OR 3 oz grilled chicken salad OR or 1/2 [place] and cheese sandwich (14-21g) Snk (PM): 4 peanut butter crackers OR a spoonful of peanut butter (3g) D (PM): 2 oz meat, baked potato, salad OR baked spaghetti with cheese or chicken or ground beef OR country fried chicken OR BBQ (14g) Snk (PM): sometimes a  spoonful of peanut butter  Fluid intake: 80-90 oz Propel, decaf tea with sweet n low Estimated total protein intake: 40-60 grams per day  Medications: see list  Supplementation: taking multivitamin but not Calcium  Using straws: Yes Drinking while eating: Yes Hair loss: No  Carbonated beverages: tried diet coke and did not like it N/V/D/C: nausea and dry heaving, "retching" ; denies vomiting Dumping syndrome: No  Recent physical activity:  Did not assess today  Progress Towards Goal(s):  In progress.  Handouts given during visit include:  None  Samples provided and patient instructed on proper use: Unjury protein powder (unflavored - qty 2) Lot#: [id number] Exp: 09/2015   Nutritional Diagnosis:  Froid-3.3 Overweight/obesity related to past poor dietary habits and physical inactivity as evidenced by patient w/ recent sleeve gastrectomy surgery following dietary guidelines for continued weight loss.    Intervention:  Nutrition education/diet advancement. Goals: Follow Phase 3B: High Protein + Non-Starchy Vegetables Eat 3-6 small meals/snacks, every 3-5 hrs Increase lean protein foods to meet 60g goal Increase fluid intake to 64oz + Avoid drinking 15 minutes before, during and 30 minutes after eating Aim for >30 min of physical activity daily Try protein like [place] sausage, try cottage cheese with salsa, chicken/egg salad, cheese/lunch meat roll up, Quest protein chips, white beans  Eat protein first and then vegetables then starch Try Citracal Petites (3x a day) Make an appointment with PCP for bloodwork (HgbA1c) Avoid fried foods Try having 1/2 a Premier protein bar for your afternoon snack  Teaching  Method Utilized:  Visual Auditory Hands on  Barriers to learning/adherence to lifestyle change: limited mobility d/t hip pain  Demonstrated degree of understanding via:  Teach Back   Monitoring/Evaluation:  Dietary intake, exercise, and body weight. Follow up in 6 weeks 7.5  month post-op visit.

## 2015-02-18 DIAGNOSIS — N390 Urinary tract infection, site not specified: Secondary | ICD-10-CM | POA: Diagnosis not present

## 2015-02-18 DIAGNOSIS — R8299 Other abnormal findings in urine: Secondary | ICD-10-CM | POA: Diagnosis not present

## 2015-02-18 DIAGNOSIS — Z23 Encounter for immunization: Secondary | ICD-10-CM | POA: Diagnosis not present

## 2015-02-19 ENCOUNTER — Ambulatory Visit (INDEPENDENT_AMBULATORY_CARE_PROVIDER_SITE_OTHER): Payer: Medicare Other | Admitting: Cardiovascular Disease

## 2015-02-19 ENCOUNTER — Encounter: Payer: Self-pay | Admitting: Cardiovascular Disease

## 2015-02-19 VITALS — BP 118/74 | HR 90 | Ht 63.0 in | Wt 227.0 lb

## 2015-02-19 DIAGNOSIS — I480 Paroxysmal atrial fibrillation: Secondary | ICD-10-CM | POA: Diagnosis not present

## 2015-02-19 DIAGNOSIS — I10 Essential (primary) hypertension: Secondary | ICD-10-CM

## 2015-02-19 DIAGNOSIS — E785 Hyperlipidemia, unspecified: Secondary | ICD-10-CM

## 2015-02-19 NOTE — Progress Notes (Signed)
02/19/2015 [name]   [birth year]  PH:[id number]  Primary Physician [name], MD Primary Cardiologist: Lorretta Harp MD [name]   HPI:  The patient is a 69 year old, severely overweight, married Caucasian female who was seen by Dr. Aldona Bar in the office on September 08, 2011, for chest pain. I last saw her in the office 12/11/13.Her risk factors include hypertension and hyperlipidemia. She also has GERD. She was admitted for heart cath which I performed radially on September 10, 2011, that was essentially normal with normal LV function. Her 2D echo was normal as well. Her major complaints have been progressive dyspnea on exertion. She does have reactive airway disease and had pneumonia in the recent past as well. I saw her last 7-10/13 she has been evaluated at [name] and was told she had vocal cord dysfunction. She is seeing Dr. Halford Chessman for pulmonary evaluation for her back here for evaluation of sinus tachycardia. She really denies chest pain but does get dyspneic on exertion which was a complaint when I saw her 2 years ago as well. Recent thyroid function tests were normal. An event monitor showed sinus rhythm, sinus tachycardia with episodes of paroxysmal atrial fibrillation. Based on her age, is gender and history of hypertension she would be a candidate for oral anticoagulation.  Because of a CHA2DS2VASC score of 3, she was begun on Eiquis oral anticoagulations. Since I saw her a year ago she's had bariatric surgery performed by Dr. 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 Alvan Dame. She has since lost 50 pounds and is now ambulatory.   Current Outpatient Prescriptions  Medication Sig Dispense Refill  . acetaminophen (TYLENOL) 500 MG tablet Take 1,000 mg by mouth 3 (three) times daily as needed for moderate pain or headache.     . albuterol (ACCUNEB) 0.63 MG/3ML nebulizer solution Take 1 ampule by nebulization every 4  (four) hours as needed for shortness of breath.    [name] albuterol (PROVENTIL HFA;VENTOLIN HFA) 108 (90 BASE) MCG/ACT inhaler Inhale 1-2 puffs into the lungs every 6 (six) hours as needed for wheezing or shortness of breath.    [name] apixaban (ELIQUIS) 5 MG TABS tablet Take 1 tablet (5 mg total) by mouth 2 (two) times daily. 60 tablet 7  . budesonide (PULMICORT) 0.5 MG/2ML nebulizer solution Take 2 mLs (0.5 mg total) by nebulization 2 (two) times daily. (Patient taking differently: Take 0.5 mg by nebulization 2 (two) times daily as needed (shorntess of breathe.). ) 120 mL 12  . Calcium-Phosphorus-Vitamin D (CALCIUM GUMMIES PO) Take by mouth 3 (three) times daily.    . clorazepate (TRANXENE) 7.5 MG tablet Take 7.5 mg by mouth 3 (three) times daily as needed for anxiety.    [name] diltiazem (CARDIZEM CD) 180 MG 24 hr capsule Take 1 capsule (180 mg total) by mouth daily. 30 capsule 7  . docusate sodium (COLACE) 100 MG capsule Take 1 capsule (100 mg total) by mouth 2 (two) times daily. 10 capsule 0  . DULoxetine (CYMBALTA) 60 MG capsule Take 1 capsule by mouth daily with supper.    . losartan (COZAAR) 50 MG tablet Take 50 mg by mouth daily.     . pantoprazole (PROTONIX) 40 MG tablet TAKE 1 TABLET TWICE DAILY 180 tablet 3  . polyethylene glycol (MIRALAX / GLYCOLAX) packet Take 17 g by mouth daily as needed.    . Probiotic Product (RESTORA) CAPS Take 1 capsule by mouth daily. (Patient taking  differently: Take 1 capsule by mouth daily with supper. ) 30 capsule 2  . traMADol (ULTRAM) 50 MG tablet 1 by mouth every 6 hours as needed for pain 1-5, 2 by mouth every 6 hours as needed for pain 6-10 120 tablet 0  . vitamin B-12 (CYANOCOBALAMIN) 1000 MCG tablet Take 1,000 mcg by mouth daily.     No current facility-administered medications for this visit.    Allergies  Allergen Reactions  . Ativan [Lorazepam] Other (See Comments)    Agitation, aggressive actions, significant mental changes  . Fentanyl Itching  . Morphine  And Related Itching and Rash    Social History   Social History  . Marital Status: Married    Spouse Name: [name]  . Number of Children: [name]  . Years of Education: [name]   Occupational History  . retired    Social History Main Topics  . Smoking status: Never Smoker   . Smokeless tobacco: Never Used  . Alcohol Use: No  . Drug Use: No  . Sexual Activity: Yes   Other Topics Concern  . Not on file   Social History Narrative     Review of Systems: General: negative for chills, fever, night sweats or weight changes.  Cardiovascular: negative for chest pain, dyspnea on exertion, edema, orthopnea, palpitations, paroxysmal nocturnal dyspnea or shortness of breath Dermatological: negative for rash Respiratory: negative for cough or wheezing Urologic: negative for hematuria Abdominal: negative for nausea, vomiting, diarrhea, bright red blood per rectum, melena, or hematemesis Neurologic: negative for visual changes, syncope, or dizziness All other systems reviewed and are otherwise negative except as noted above.    Blood pressure 118/74, pulse 90, height 5\' 3"  (1.6 m), weight 227 lb (102.967 kg).  General appearance: alert and no distress Neck: no adenopathy, no carotid bruit, no JVD, supple, symmetrical, trachea midline and thyroid not enlarged, symmetric, no tenderness/mass/nodules Lungs: clear to auscultation bilaterally Heart: regular rate and rhythm, S1, S2 normal, no murmur, click, rub or gallop Extremities: extremities normal, atraumatic, no cyanosis or edema  EKG not performed today  ASSESSMENT AND PLAN:   Paroxysmal atrial fibrillation History of PAF and was treated on event monitoring currently taking Eliquis  oral anticoagulation for stroke prophylaxis because of a CHA2DSVASC2 score of 3.  Hyperlipemia History of hyperlipidemia not on statin therapy followed by her PCP  HTN (hypertension) History of hypertension blood pressure measured at 118/74. She is on  diltiazem 180 and losartan. Continue current meds at current dosing      Lorretta Harp MD [facility], [facility] 02/19/2015 3:20 PM

## 2015-02-19 NOTE — Assessment & Plan Note (Signed)
History of PAF and was treated on event monitoring currently taking Eliquis  oral anticoagulation for stroke prophylaxis because of a CHA2DSVASC2 score of 3.

## 2015-02-19 NOTE — Assessment & Plan Note (Signed)
History of hyperlipidemia not on statin therapy followed by her PCP 

## 2015-02-19 NOTE — Patient Instructions (Signed)
Medication Instructions:  Your physician recommends that you continue on your current medications as directed. Please refer to the Current Medication list given to you today.   Labwork: none  Testing/Procedures: Your physician has recommended that you wear an event monitor. Event monitors are medical devices that record the heart's electrical activity. Doctors most often [place] these monitors to diagnose arrhythmias. Arrhythmias are problems with the speed or rhythm of the heartbeat. The monitor is a small, portable device. You can wear one while you do your normal daily activities. This is usually used to diagnose what is causing palpitations/syncope (passing out).    Follow-Up: Your physician wants you to follow-up in: 6 months with Dr. Gwenlyn Found. You will receive a reminder letter in the mail two months in advance. If you don't receive a letter, please call our office to schedule the follow-up appointment. Pt needs to wear 30 day event monitor before this appt.    Any Other Special Instructions Will Be Listed Below (If Applicable).     If you need a refill on your cardiac medications before your next appointment, please call your pharmacy.

## 2015-02-19 NOTE — Assessment & Plan Note (Signed)
History of hypertension blood pressure measured at 118/74. She is on diltiazem 180 and losartan. Continue current meds at current dosing

## 2015-02-26 DIAGNOSIS — Z6838 Body mass index (BMI) 38.0-38.9, adult: Secondary | ICD-10-CM | POA: Diagnosis not present

## 2015-02-26 DIAGNOSIS — F329 Major depressive disorder, single episode, unspecified: Secondary | ICD-10-CM | POA: Diagnosis not present

## 2015-02-26 DIAGNOSIS — K589 Irritable bowel syndrome without diarrhea: Secondary | ICD-10-CM | POA: Diagnosis not present

## 2015-02-26 DIAGNOSIS — N3941 Urge incontinence: Secondary | ICD-10-CM | POA: Diagnosis not present

## 2015-02-26 DIAGNOSIS — K117 Disturbances of salivary secretion: Secondary | ICD-10-CM | POA: Diagnosis not present

## 2015-02-26 DIAGNOSIS — N390 Urinary tract infection, site not specified: Secondary | ICD-10-CM | POA: Diagnosis not present

## 2015-02-26 DIAGNOSIS — R110 Nausea: Secondary | ICD-10-CM | POA: Diagnosis not present

## 2015-02-26 DIAGNOSIS — R8299 Other abnormal findings in urine: Secondary | ICD-10-CM | POA: Diagnosis not present

## 2015-02-26 DIAGNOSIS — R7301 Impaired fasting glucose: Secondary | ICD-10-CM | POA: Diagnosis not present

## 2015-02-26 DIAGNOSIS — G4709 Other insomnia: Secondary | ICD-10-CM | POA: Diagnosis not present

## 2015-03-20 ENCOUNTER — Encounter: Payer: Self-pay | Admitting: Dietician

## 2015-03-20 ENCOUNTER — Encounter: Payer: Medicare Other | Attending: Surgery | Admitting: Dietician

## 2015-03-20 VITALS — Wt 219.2 lb

## 2015-03-20 DIAGNOSIS — Z6841 Body Mass Index (BMI) 40.0 and over, adult: Secondary | ICD-10-CM | POA: Diagnosis not present

## 2015-03-20 DIAGNOSIS — Z713 Dietary counseling and surveillance: Secondary | ICD-10-CM | POA: Diagnosis not present

## 2015-03-20 NOTE — Progress Notes (Signed)
  Follow-up visit:  7 months Post-Operative Sleeve Gastrectomy Surgery  Medical Nutrition Therapy:  Appt start time: [id number] end time: 1205  Primary concerns today: Post-operative Bariatric Surgery Nutrition Management. Returns having lost another 8.6 lbs. She reports she has gone down 4 sizes in tops and pants. States she cannot tolerate anything sweet. Still struggling with gagging and heaving (usually does not vomit) many mornings. Tried having a spoonful of peanut butter in the night and this did not help. She also complains of itching. Recent A1c 5.5% per patient.   Goal: size 14 in pants and medium in shirts  Surgery date: 08/20/14 Surgery type: Gastric sleeve Start weight at [facility]: 278 lbs on 06/27/14 Weight today: 219.2 lbs Weight change: 8.6 lbs Total weight loss: 58.8 lbs  Preferred Learning Style:   No preference indicated   Learning Readiness:   Ready  24-hr recall: B (10 AM): bacon or sausage biscuit OR eggs and grits OR oatmeal OR egg McMuffin (2-7g) Snk (AM): usually none, sometimes a cheese stick (0-7g) L (PM): 1/2 Chickfila grilled chicken wrap OR plain hamburger from McDonald's OR chicken noodle/rice soup OR [place] and cheese rollup (7-14g) Snk (PM): cheese stick or part of a protein bar (5-7g) D (PM): taco soup OR 2 grilled chicken tenders with vegetables OR 2 oz steak with vegetables and potato (7-14g) Snk (PM): part of protein bar or sugar free popsicle  Fluid intake: 80-90 oz Propel, decaf tea with sweet n low Estimated total protein intake: 30-50 grams per day  Medications: see list  Supplementation: taking multivitamin but not Calcium  Using straws: Yes Drinking while eating: sips Hair loss: No  Carbonated beverages: none N/V/D/C: nausea and dry heaving, "retching" ; denies vomiting Dumping syndrome: No  Recent physical activity:  Did not assess today  Progress Towards Goal(s):  In progress.  Handouts given during visit include:  None   Nutritional  Diagnosis:  [place]-3.3 Overweight/obesity related to past poor dietary habits and physical inactivity as evidenced by patient w/ recent sleeve gastrectomy surgery following dietary guidelines for continued weight loss.    Intervention:  Nutrition education/diet advancement. Goals: Follow Phase 3B: High Protein + Non-Starchy Vegetables Eat 3-6 small meals/snacks, every 3-5 hrs Increase lean protein foods to meet 60g goal Increase fluid intake to 64oz + Avoid drinking 15 minutes before, during and 30 minutes after eating Aim for >30 min of physical activity daily Try protein like [place] sausage, try cottage cheese with salsa, chicken/egg salad, cheese/lunch meat roll up, Quest protein chips, white beans  Continue to include protein foods with each meal and snack and eat those foods first Try adding chicken soup protein powder to soups Add unflavored protein powder to oatmeal Try having more protein and less carbs with breakfast (openfaced biscuit with sausage and egg) Try [place] Clap frittatas Try Citracal Petites (3x a day)  Teaching Method Utilized:  Visual Auditory Hands on  Barriers to learning/adherence to lifestyle change: limited mobility d/t hip pain  Demonstrated degree of understanding via:  Teach Back   Monitoring/Evaluation:  Dietary intake, exercise, and body weight. Follow up in 6 weeks 9 month post-op visit.

## 2015-03-20 NOTE — Patient Instructions (Addendum)
Goals: Follow Phase 3B: High Protein + Non-Starchy Vegetables Eat 3-6 small meals/snacks, every 3-5 hrs Increase lean protein foods to meet 60g goal Increase fluid intake to 64oz + Avoid drinking 15 minutes before, during and 30 minutes after eating Aim for >30 min of physical activity daily Try protein like [place] sausage, try cottage cheese with salsa, chicken/egg salad, cheese/lunch meat roll up, Quest protein chips, white beans  Continue to include protein foods with each meal and snack and eat those foods first Try adding chicken soup protein powder to soups Add unflavored protein powder to oatmeal Try having more protein and less carbs with breakfast (openfaced biscuit with sausage and egg) Try [name] frittatas Try Citracal Petites (3x a day)  Surgery date: 08/20/14 Surgery type: Gastric sleeve Start weight at [facility]: 278 lbs on 06/27/14 Weight today: 219.2 lbs Weight change: 8.6 lbs Total weight loss: 58.8 lbs

## 2015-04-02 DIAGNOSIS — R8290 Unspecified abnormal findings in urine: Secondary | ICD-10-CM | POA: Diagnosis not present

## 2015-04-02 DIAGNOSIS — R7301 Impaired fasting glucose: Secondary | ICD-10-CM | POA: Diagnosis not present

## 2015-04-02 DIAGNOSIS — E784 Other hyperlipidemia: Secondary | ICD-10-CM | POA: Diagnosis not present

## 2015-04-02 DIAGNOSIS — I10 Essential (primary) hypertension: Secondary | ICD-10-CM | POA: Diagnosis not present

## 2015-04-09 DIAGNOSIS — I10 Essential (primary) hypertension: Secondary | ICD-10-CM | POA: Diagnosis not present

## 2015-04-09 DIAGNOSIS — Z6837 Body mass index (BMI) 37.0-37.9, adult: Secondary | ICD-10-CM | POA: Diagnosis not present

## 2015-04-09 DIAGNOSIS — G4709 Other insomnia: Secondary | ICD-10-CM | POA: Diagnosis not present

## 2015-04-09 DIAGNOSIS — R110 Nausea: Secondary | ICD-10-CM | POA: Diagnosis not present

## 2015-04-09 DIAGNOSIS — Z0000 Encounter for general adult medical examination without abnormal findings: Secondary | ICD-10-CM | POA: Diagnosis not present

## 2015-04-09 DIAGNOSIS — N390 Urinary tract infection, site not specified: Secondary | ICD-10-CM | POA: Diagnosis not present

## 2015-04-09 DIAGNOSIS — Z1389 Encounter for screening for other disorder: Secondary | ICD-10-CM | POA: Diagnosis not present

## 2015-04-09 DIAGNOSIS — E784 Other hyperlipidemia: Secondary | ICD-10-CM | POA: Diagnosis not present

## 2015-04-09 DIAGNOSIS — J45909 Unspecified asthma, uncomplicated: Secondary | ICD-10-CM | POA: Diagnosis not present

## 2015-04-09 DIAGNOSIS — R7301 Impaired fasting glucose: Secondary | ICD-10-CM | POA: Diagnosis not present

## 2015-04-09 DIAGNOSIS — M159 Polyosteoarthritis, unspecified: Secondary | ICD-10-CM | POA: Diagnosis not present

## 2015-04-09 DIAGNOSIS — E559 Vitamin D deficiency, unspecified: Secondary | ICD-10-CM | POA: Diagnosis not present

## 2015-04-25 DIAGNOSIS — Z9884 Bariatric surgery status: Secondary | ICD-10-CM | POA: Diagnosis not present

## 2015-04-29 DIAGNOSIS — M25571 Pain in right ankle and joints of right foot: Secondary | ICD-10-CM | POA: Diagnosis not present

## 2015-04-29 DIAGNOSIS — Z96661 Presence of right artificial ankle joint: Secondary | ICD-10-CM | POA: Diagnosis not present

## 2015-05-09 ENCOUNTER — Encounter: Payer: Self-pay | Admitting: Dietician

## 2015-05-09 ENCOUNTER — Encounter: Payer: Medicare Other | Attending: Surgery | Admitting: Dietician

## 2015-05-09 DIAGNOSIS — Z6841 Body Mass Index (BMI) 40.0 and over, adult: Secondary | ICD-10-CM | POA: Diagnosis not present

## 2015-05-09 DIAGNOSIS — Z713 Dietary counseling and surveillance: Secondary | ICD-10-CM | POA: Insufficient documentation

## 2015-05-09 NOTE — Progress Notes (Signed)
  Follow-up visit:  9 months Post-Operative Sleeve Gastrectomy Surgery  Medical Nutrition Therapy:  Appt start time: [id number] end time: [id number] concerns today: Post-operative Bariatric Surgery Nutrition Management. Returns having lost another 10 lbs. Had an appointment with surgeon after last visit at [facility] and was advised that her gagging and heaving was due to eating in bed. Since having breakfast at the table, her symptoms have resolved. HgbA1c 5.1%. Currently in 16 pants and large top. Weighs herself daily and knows this is not recommended.   Goal: size 14 in pants and medium in shirts  Surgery date: 08/20/14 Surgery type: Gastric sleeve Start weight at [facility]: 278 lbs on 06/27/14 Weight today: 209.6 lbs Weight change: 10 lbs Total weight loss: 68.4 lbs  Preferred Learning Style:   No preference indicated   Learning Readiness:   Ready  24-hr recall: B (10 AM): [name] Dean frittatas or English muffins (14g) Snk (AM): usually none L (PM): Chicken and rice soup or tomato bisque with cheese stick (7g) Snk (PM): cheese stick or yogurt (5-7g) D (PM): spaghetti (more meat sauce than noodles); 2-3 oz steak or chicken with part of baked potato (14-21g)  Snk (PM): part of protein bar or sugar free popsicle  Fluid intake: 100 oz Propel, decaf tea with sweet n low Estimated total protein intake: 30-50 grams per day  Medications: see list  Supplementation: taking multivitamin but not Calcium  Using straws: Yes Drinking while eating: sips Hair loss: No  Carbonated beverages: none; tried a sip of Diet Coke and did not like N/V/D/C: nausea and gagging have resolved Dumping syndrome: No  Recent physical activity:  Walking with grandson 2-3 days a week  Progress Towards Goal(s):  In progress.  Handouts given during visit include:  None   Nutritional Diagnosis:  [place]-3.3 Overweight/obesity related to past poor dietary habits and physical inactivity as evidenced by patient w/ recent  sleeve gastrectomy surgery following dietary guidelines for continued weight loss.    Intervention:  Nutrition education/diet advancement. Goals: Follow Phase 3B: High Protein + Non-Starchy Vegetables Eat 3-6 small meals/snacks, every 3-5 hrs Increase lean protein foods to meet 60g goal Increase fluid intake to 64oz + Avoid drinking 15 minutes before, during and 30 minutes after eating Aim for >30 min of physical activity daily Try protein like [place] sausage, try cottage cheese with salsa, chicken/egg salad, cheese/lunch meat roll up, Quest protein chips, white beans  Continue to include protein foods with each meal and snack and eat those foods first Try Citracal Petites (3x a day) Keep up exercise routine!  Teaching Method Utilized:  Visual Auditory Hands on  Barriers to learning/adherence to lifestyle change: limited mobility d/t hip pain  Demonstrated degree of understanding via:  Teach Back   Monitoring/Evaluation:  Dietary intake, exercise, and body weight. Follow up in 3 months for 12 month post-op visit.

## 2015-05-09 NOTE — Patient Instructions (Addendum)
Goals: Follow Phase 3B: High Protein + Non-Starchy Vegetables Eat 3-6 small meals/snacks, every 3-5 hrs Increase lean protein foods to meet 60g goal Increase fluid intake to 64oz + Avoid drinking 15 minutes before, during and 30 minutes after eating Aim for >30 min of physical activity daily Try protein like [place] sausage, try cottage cheese with salsa, chicken/egg salad, cheese/lunch meat roll up, Quest protein chips, white beans  Continue to include protein foods with each meal and snack and eat those foods first Try Citracal Petites (3x a day) Keep up exercise routine!  Surgery date: 08/20/14 Surgery type: Gastric sleeve Start weight at [facility]: 278 lbs on 06/27/14 Weight today: 209.6 lbs Weight change: 10 lbs Total weight loss: 68.4 lbs

## 2015-05-14 DIAGNOSIS — Z1231 Encounter for screening mammogram for malignant neoplasm of breast: Secondary | ICD-10-CM | POA: Diagnosis not present

## 2015-05-15 DIAGNOSIS — G8929 Other chronic pain: Secondary | ICD-10-CM | POA: Diagnosis not present

## 2015-05-15 DIAGNOSIS — M25511 Pain in right shoulder: Secondary | ICD-10-CM | POA: Diagnosis not present

## 2015-05-21 DIAGNOSIS — H5213 Myopia, bilateral: Secondary | ICD-10-CM | POA: Diagnosis not present

## 2015-05-21 DIAGNOSIS — H2513 Age-related nuclear cataract, bilateral: Secondary | ICD-10-CM | POA: Diagnosis not present

## 2015-05-28 ENCOUNTER — Telehealth: Payer: Self-pay | Admitting: Cardiovascular Disease

## 2015-05-28 DIAGNOSIS — I480 Paroxysmal atrial fibrillation: Secondary | ICD-10-CM

## 2015-05-28 NOTE — Telephone Encounter (Signed)
NEw Message  Pt requested to speak w/ RN- stated she was to call office to notify when she was under 200 lbs- she stated she is under 200lbs and wanted to know what to do going forward. Please call back and discuss.

## 2015-05-28 NOTE — Telephone Encounter (Signed)
Patient states she has reached 200 lbs AND below At last conversation, with Dr Gwenlyn Found he would have patient to wear monitor for 30 days( dx PAF ) then follow up appointment Patient will be out of town the 2nd week of MAY 2017. SCHEDULE APPT 07/22/15 MONITOR TO BE SCHEDULE TO BE PLACED ON  THE WEEK MARCH 13 -17 WORN FOR 30 DAYS PATIENT VERBALIZED UNDERSTANDING

## 2015-05-30 ENCOUNTER — Telehealth: Payer: Self-pay | Admitting: Cardiovascular Disease

## 2015-05-30 NOTE — Telephone Encounter (Signed)
Spoke to patient  Informed her, scheduler Jeanene Erb) will contact her today with appointment -for Event monitor placement Patient verbalized understanding.

## 2015-05-30 NOTE — Telephone Encounter (Signed)
New message      Calling to let the nurse know that she has not heard from the heart monitor people

## 2015-06-06 ENCOUNTER — Ambulatory Visit (INDEPENDENT_AMBULATORY_CARE_PROVIDER_SITE_OTHER): Payer: Medicare Other

## 2015-06-06 DIAGNOSIS — I480 Paroxysmal atrial fibrillation: Secondary | ICD-10-CM | POA: Diagnosis not present

## 2015-06-12 DIAGNOSIS — M19011 Primary osteoarthritis, right shoulder: Secondary | ICD-10-CM | POA: Diagnosis not present

## 2015-06-24 DIAGNOSIS — M25511 Pain in right shoulder: Secondary | ICD-10-CM | POA: Diagnosis not present

## 2015-06-26 DIAGNOSIS — L820 Inflamed seborrheic keratosis: Secondary | ICD-10-CM | POA: Diagnosis not present

## 2015-06-26 DIAGNOSIS — D485 Neoplasm of uncertain behavior of skin: Secondary | ICD-10-CM | POA: Diagnosis not present

## 2015-06-26 DIAGNOSIS — D2372 Other benign neoplasm of skin of left lower limb, including hip: Secondary | ICD-10-CM | POA: Diagnosis not present

## 2015-06-26 DIAGNOSIS — L821 Other seborrheic keratosis: Secondary | ICD-10-CM | POA: Diagnosis not present

## 2015-06-26 DIAGNOSIS — L905 Scar conditions and fibrosis of skin: Secondary | ICD-10-CM | POA: Diagnosis not present

## 2015-07-02 DIAGNOSIS — D485 Neoplasm of uncertain behavior of skin: Secondary | ICD-10-CM | POA: Diagnosis not present

## 2015-07-02 DIAGNOSIS — D225 Melanocytic nevi of trunk: Secondary | ICD-10-CM | POA: Diagnosis not present

## 2015-07-03 ENCOUNTER — Other Ambulatory Visit: Payer: Self-pay | Admitting: Cardiovascular Disease

## 2015-07-03 NOTE — Telephone Encounter (Signed)
Rx(s) sent to pharmacy electronically.  

## 2015-07-07 ENCOUNTER — Other Ambulatory Visit: Payer: Self-pay | Admitting: Cardiovascular Disease

## 2015-07-07 MED ORDER — DILTIAZEM HCL ER COATED BEADS 180 MG PO CP24: 180.0000 mg | ORAL_CAPSULE | Freq: Every day | ORAL | Status: DC

## 2015-07-07 MED ORDER — APIXABAN 5 MG PO TABS: 5.0000 mg | ORAL_TABLET | Freq: Two times a day (BID) | ORAL | Status: DC

## 2015-07-07 NOTE — Telephone Encounter (Signed)
Rx(s) sent to pharmacy electronically.  

## 2015-07-07 NOTE — Telephone Encounter (Signed)
[name] is calling because [facility] in [place] is saying that they did not get the refill sent in on 07/03/15 for her [place] and Eliquis . Please call   Thanks

## 2015-07-11 ENCOUNTER — Other Ambulatory Visit: Payer: Self-pay

## 2015-07-11 NOTE — Patient Outreach (Signed)
[name] [facility]) Care Management  07/11/2015  [name] Sep 09, 1945 [id number]  Telephone call to patient for screening call. Patient answers and states she is busy shopping. Patient asked for a call another time.    Plan: RN Health Coach will contact patient within 1-2 weeks.    Jone Baseman, RN, MSN [place] [phone]

## 2015-07-15 ENCOUNTER — Other Ambulatory Visit: Payer: Self-pay

## 2015-07-15 NOTE — Patient Outreach (Signed)
[facility]) Care Management  07/15/2015  [name] 1945-07-07 [id number]   Telephone call to patient for referral screening.  Explained to patient [facility] Management.  Patient reports she is doing so much better.  She reports this time last year she really in bad shape but has since lost 80 lbs and is active.  She reports she couldn't be happier. Patient declined services at this time but appreciative of the offer to the program.    Plan: RN Health Coach will send letter regarding program to patient. RN Health Coach will notify care management assistant to close case.   Jone Baseman, RN, MSN [place] [phone]

## 2015-07-22 ENCOUNTER — Ambulatory Visit (INDEPENDENT_AMBULATORY_CARE_PROVIDER_SITE_OTHER): Payer: Medicare Other | Admitting: Cardiovascular Disease

## 2015-07-22 ENCOUNTER — Encounter: Payer: Self-pay | Admitting: Cardiovascular Disease

## 2015-07-22 VITALS — BP 102/64 | HR 95 | Ht 63.0 in | Wt 196.0 lb

## 2015-07-22 DIAGNOSIS — I10 Essential (primary) hypertension: Secondary | ICD-10-CM

## 2015-07-22 DIAGNOSIS — I480 Paroxysmal atrial fibrillation: Secondary | ICD-10-CM

## 2015-07-22 DIAGNOSIS — E785 Hyperlipidemia, unspecified: Secondary | ICD-10-CM

## 2015-07-22 NOTE — Progress Notes (Signed)
07/22/2015 [name]   01-06-1946  [id number]  Primary Physician [name], MD Primary Cardiologist: Lorretta Harp MD [name]   HPI:  The patient is a 70 year old, severely overweight, married Caucasian female who was seen by Dr. Aldona Bar in the office on September 08, 2011, for chest pain. I last saw her in the office 02/19/15.Theresa KitchenHer risk factors include hypertension and hyperlipidemia. She also has GERD. She was admitted for heart cath which I performed radially on September 10, 2011, that was essentially normal with normal LV function. Her 2D echo was normal as well. Her major complaints have been progressive dyspnea on exertion. She does have reactive airway disease and had pneumonia in the recent past as well. 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 She is seeing Dr. Halford Chessman for pulmonary evaluation for her back here for evaluation of sinus tachycardia. She really denies chest pain but does get dyspneic on exertion which was a complaint when I saw her 2 years ago as well. Recent thyroid function tests were normal. An event monitor showed sinus rhythm, sinus tachycardia with episodes of paroxysmal atrial fibrillation. Based on her age, is gender and history of hypertension she would be a candidate for oral anticoagulation. Because of a CHA2DS2VASC score of 3, she was begun on Eiquis oral anticoagulations.She has had bariatric surgery performed by Dr. 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 Alvan Dame. She has since lost 90 pounds and is now ambulatory.she denies chest pain or shortness of breath. She is much more active and has more energy. Recent 30 day event monitor showed sinus rhythm with PACs but no evidence of PAF.   Current Outpatient Prescriptions  Medication Sig Dispense Refill  . clorazepate (TRANXENE) 7.5 MG tablet Take 7.5 mg by mouth 3 (three) times  daily as needed for anxiety.    [name] diltiazem (CARTIA XT) 180 MG 24 hr capsule Take 1 capsule (180 mg total) by mouth daily. 30 capsule 7  . docusate sodium (COLACE) 100 MG capsule Take 1 capsule (100 mg total) by mouth 2 (two) times daily. 10 capsule 0  . DULoxetine (CYMBALTA) 60 MG capsule Take 1 capsule by mouth daily with supper.    . losartan (COZAAR) 50 MG tablet Take 50 mg by mouth daily.     . pantoprazole (PROTONIX) 40 MG tablet TAKE 1 TABLET TWICE DAILY 180 tablet 3  . Probiotic Product (RESTORA) CAPS Take 1 capsule by mouth daily. (Patient taking differently: Take 1 capsule by mouth daily with supper. ) 30 capsule 2  . vitamin B-12 (CYANOCOBALAMIN) 1000 MCG tablet Take 1,000 mcg by mouth daily.     No current facility-administered medications for this visit.    Allergies  Allergen Reactions  . Ativan [Lorazepam] Other (See Comments)    Agitation, aggressive actions, significant mental changes  . Fentanyl Itching  . Morphine And Related Itching and Rash    Social History   Social History  . Marital Status: Married    Spouse Name: [name]  . Number of Children: [name]  . Years of Education: [name]   Occupational History  . retired    Social History Main Topics  . Smoking status: Never Smoker   . Smokeless tobacco: Never Used  . Alcohol Use: No  . Drug Use: No  . Sexual Activity: Yes   Other Topics Concern  . Not on file  Social History Narrative     Review of Systems: General: negative for chills, fever, night sweats or weight changes.  Cardiovascular: negative for chest pain, dyspnea on exertion, edema, orthopnea, palpitations, paroxysmal nocturnal dyspnea or shortness of breath Dermatological: negative for rash Respiratory: negative for cough or wheezing Urologic: negative for hematuria Abdominal: negative for nausea, vomiting, diarrhea, bright red blood per rectum, melena, or hematemesis Neurologic: negative for visual changes, syncope, or dizziness All other  systems reviewed and are otherwise negative except as noted above.    Blood pressure 102/64, pulse 95, height 5\' 3"  (1.6 m), weight 196 lb (88.905 kg).  General appearance: alert and no distress Neck: no adenopathy, no carotid bruit, no JVD, supple, symmetrical, trachea midline and thyroid not enlarged, symmetric, no tenderness/mass/nodules Lungs: clear to auscultation bilaterally Heart: regular rate and rhythm, S1, S2 normal, no murmur, click, rub or gallop Extremities: extremities normal, atraumatic, no cyanosis or edema  EKG not performed today  ASSESSMENT AND PLAN:   HTN (hypertension) History of hypertension blood pressure measured today at 102/64. She is on diltiazem and losartan which she takes twice a day. I'm going to change her losartan to once a day.  Hyperlipemia history of hyperlipidemia followed by her PCP  Paroxysmal atrial fibrillation History of paroxysmal atrial fibrillation the past on Eliquis  She has since lost 90 pounds is much more active.A recent 30 event monitor showed sinus rhythm with PACs. Based on this,I'm going to stop her oral anticoagulant.      Lorretta Harp MD FACP,FACC,FAHA, [facility] 07/22/2015 11:59 AM

## 2015-07-22 NOTE — Patient Instructions (Signed)
Medication Instructions:  Your physician has recommended you make the following change in your medication:  1) STOP Eliquis 2) Take Losartan 50 mg tablet by mouth ONCE daily   Labwork: NONE  Testing/Procedures: NONE  Follow-Up: Your physician wants you to follow-up in: 12 months with Dr. Gwenlyn Found. You will receive a reminder letter in the mail two months in advance. If you don't receive a letter, please call our office to schedule the follow-up appointment.   Any Other Special Instructions Will Be Listed Below (If Applicable).     If you need a refill on your cardiac medications before your next appointment, please call your pharmacy.

## 2015-07-22 NOTE — Assessment & Plan Note (Signed)
History of paroxysmal atrial fibrillation the past on Eliquis  She has since lost 90 pounds is much more active.A recent 30 event monitor showed sinus rhythm with PACs. Based on this,I'm going to stop her oral anticoagulant.

## 2015-07-22 NOTE — Assessment & Plan Note (Signed)
History of hypertension blood pressure measured today at 102/64. She is on diltiazem and losartan which she takes twice a day. I'm going to change her losartan to once a day.

## 2015-07-22 NOTE — Assessment & Plan Note (Signed)
history of hyperlipidemia followed by her PCP

## 2015-08-05 ENCOUNTER — Other Ambulatory Visit: Payer: Self-pay

## 2015-08-05 MED ORDER — DILTIAZEM HCL ER COATED BEADS 180 MG PO CP24: 180.0000 mg | ORAL_CAPSULE | Freq: Every day | ORAL | Status: DC

## 2015-08-05 NOTE — Telephone Encounter (Signed)
REFILL 

## 2015-08-06 ENCOUNTER — Encounter: Payer: Medicare Other | Attending: Surgery | Admitting: Dietician

## 2015-08-06 ENCOUNTER — Encounter: Payer: Self-pay | Admitting: Dietician

## 2015-08-06 DIAGNOSIS — Z09 Encounter for follow-up examination after completed treatment for conditions other than malignant neoplasm: Secondary | ICD-10-CM | POA: Insufficient documentation

## 2015-08-06 DIAGNOSIS — Z9884 Bariatric surgery status: Secondary | ICD-10-CM | POA: Diagnosis not present

## 2015-08-06 NOTE — Patient Instructions (Addendum)
Goals: Follow Phase 3B: High Protein + Non-Starchy Vegetables Eat 3-6 small meals/snacks, every 3-5 hrs Increase lean protein foods to meet 60g goal Increase fluid intake to 64oz + Avoid drinking 15 minutes before, during and 30 minutes after eating Aim for >30 min of physical activity daily Try protein like [place] sausage, try cottage cheese with salsa, chicken/egg salad, cheese/lunch meat roll up, Quest protein chips, white beans  Continue to include protein foods with each meal and snack and eat those foods first Try Citracal Petites (3x a day) Keep up exercise routine! Have Dr. Earlie Server office fax [facility] a new referral  Surgery date: 08/20/14 Surgery type: Gastric sleeve Start weight at [facility]: 278 lbs on 06/27/14 Weight today: 195.2 lbs Weight change: 14.4 lbs Total weight loss: 83 lbs

## 2015-08-06 NOTE — Progress Notes (Signed)
  Follow-up visit:  12 months Post-Operative Sleeve Gastrectomy Surgery  Medical Nutrition Therapy:  Appt start time: 1100 end time: 1145  Primary concerns today: Post-operative Bariatric Surgery Nutrition Management. Returns having lost another 14 lbs. She reports feeling great overall! Still struggles with nausea occasionally and does not like to take zofran because it causes constipation. Anaiz states she is walking more and walked on the beach for the first time is years! She continues to eat protein foods first but feels like she is eating too many carbs. Continues to have an aversion to most sweets and diet coke. Feels like she gets hungrier when she drinks Crystal Light.  Goal: size 14 in pants and medium in shirts (Goal has been met!)  Surgery date: 08/20/14 Surgery type: Gastric sleeve Start weight at [facility]: 278 lbs on 06/27/14 Weight today: 195.2 lbs Weight change: 14.4 lbs Total weight loss: 83 lbs  Preferred Learning Style:   No preference indicated   Learning Readiness:   Ready  24-hr recall: B (10 AM): [name] Dean frittatas or English muffin with egg white, cheese, and bacon (14g) Snk (AM): usually none L (PM): pimiento cheese sandwich OR jr whopper OR fried chicken sandwich OR chicken salad (10-14g) Snk (PM): pork skins or yogurt or cheese stick (6-12g) D (PM): 2-2.5 oz steak with baked potato OR grilled chicken salad Snk (PM): part of protein bar or sugar free popsicle  Fluid intake: 100 oz Propel, decaf tea with sweet n low Estimated total protein intake: 30-50 grams per day  Medications: see list  Supplementation: taking multivitamin but not Calcium  Using straws: Yes Drinking while eating: sips Hair loss: No, having regrowth  Carbonated beverages: none; tried a sip of Diet Coke and did not like N/V/D/C: nausea and gagging have improved Dumping syndrome: No  Recent physical activity:  Walking with grandson 2-3 days a week  Progress Towards Goal(s):  In  progress.  Handouts given during visit include:  None   Nutritional Diagnosis:  [place]-3.3 Overweight/obesity related to past poor dietary habits and physical inactivity as evidenced by patient w/ recent sleeve gastrectomy surgery following dietary guidelines for continued weight loss.    Intervention:  Nutrition education/diet advancement. Goals: Follow Phase 3B: High Protein + Non-Starchy Vegetables Eat 3-6 small meals/snacks, every 3-5 hrs Increase lean protein foods to meet 60g goal Increase fluid intake to 64oz + Avoid drinking 15 minutes before, during and 30 minutes after eating Aim for >30 min of physical activity daily Try protein like [place] sausage, try cottage cheese with salsa, chicken/egg salad, cheese/lunch meat roll up, Quest protein chips, white beans  Continue to include protein foods with each meal and snack and eat those foods first Try Citracal Petites (3x a day) Keep up exercise routine!  Teaching Method Utilized:  Visual Auditory Hands on  Barriers to learning/adherence to lifestyle change: none  Demonstrated degree of understanding via:  Teach Back   Monitoring/Evaluation:  Dietary intake, exercise, and body weight. Follow up in 3 months for 15 month post-op visit.

## 2015-08-14 DIAGNOSIS — Z903 Acquired absence of stomach [part of]: Secondary | ICD-10-CM | POA: Diagnosis not present

## 2015-09-02 DIAGNOSIS — M19011 Primary osteoarthritis, right shoulder: Secondary | ICD-10-CM | POA: Diagnosis not present

## 2015-09-02 DIAGNOSIS — M7541 Impingement syndrome of right shoulder: Secondary | ICD-10-CM | POA: Diagnosis not present

## 2015-10-06 DIAGNOSIS — Z6833 Body mass index (BMI) 33.0-33.9, adult: Secondary | ICD-10-CM | POA: Diagnosis not present

## 2015-10-06 DIAGNOSIS — G4709 Other insomnia: Secondary | ICD-10-CM | POA: Diagnosis not present

## 2015-10-06 DIAGNOSIS — I480 Paroxysmal atrial fibrillation: Secondary | ICD-10-CM | POA: Diagnosis not present

## 2015-10-06 DIAGNOSIS — I10 Essential (primary) hypertension: Secondary | ICD-10-CM | POA: Diagnosis not present

## 2015-10-06 DIAGNOSIS — R7301 Impaired fasting glucose: Secondary | ICD-10-CM | POA: Diagnosis not present

## 2015-10-06 DIAGNOSIS — J45998 Other asthma: Secondary | ICD-10-CM | POA: Diagnosis not present

## 2015-10-06 DIAGNOSIS — Z1389 Encounter for screening for other disorder: Secondary | ICD-10-CM | POA: Diagnosis not present

## 2015-10-06 DIAGNOSIS — E784 Other hyperlipidemia: Secondary | ICD-10-CM | POA: Diagnosis not present

## 2015-10-20 DIAGNOSIS — Z6832 Body mass index (BMI) 32.0-32.9, adult: Secondary | ICD-10-CM | POA: Diagnosis not present

## 2015-10-20 DIAGNOSIS — J45901 Unspecified asthma with (acute) exacerbation: Secondary | ICD-10-CM | POA: Diagnosis not present

## 2015-10-20 DIAGNOSIS — J019 Acute sinusitis, unspecified: Secondary | ICD-10-CM | POA: Diagnosis not present

## 2015-10-20 DIAGNOSIS — H6692 Otitis media, unspecified, left ear: Secondary | ICD-10-CM | POA: Diagnosis not present

## 2015-10-20 DIAGNOSIS — R05 Cough: Secondary | ICD-10-CM | POA: Diagnosis not present

## 2015-10-20 DIAGNOSIS — J301 Allergic rhinitis due to pollen: Secondary | ICD-10-CM | POA: Diagnosis not present

## 2015-10-29 DIAGNOSIS — Z471 Aftercare following joint replacement surgery: Secondary | ICD-10-CM | POA: Diagnosis not present

## 2015-10-29 DIAGNOSIS — Z96651 Presence of right artificial knee joint: Secondary | ICD-10-CM | POA: Diagnosis not present

## 2015-10-29 DIAGNOSIS — Z96653 Presence of artificial knee joint, bilateral: Secondary | ICD-10-CM | POA: Diagnosis not present

## 2015-10-29 DIAGNOSIS — Z96643 Presence of artificial hip joint, bilateral: Secondary | ICD-10-CM | POA: Diagnosis not present

## 2015-10-29 DIAGNOSIS — Z96652 Presence of left artificial knee joint: Secondary | ICD-10-CM | POA: Diagnosis not present

## 2015-11-05 ENCOUNTER — Encounter: Payer: Medicare Other | Attending: Internal Medicine | Admitting: Dietician

## 2015-11-05 ENCOUNTER — Encounter: Payer: Self-pay | Admitting: Dietician

## 2015-11-05 DIAGNOSIS — Z6841 Body Mass Index (BMI) 40.0 and over, adult: Secondary | ICD-10-CM

## 2015-11-05 DIAGNOSIS — Z029 Encounter for administrative examinations, unspecified: Secondary | ICD-10-CM | POA: Diagnosis present

## 2015-11-05 NOTE — Patient Instructions (Addendum)
Goals: Follow Phase 3B: High Protein + Non-Starchy Vegetables Eat 3-6 small meals/snacks, every 3-5 hrs Increase lean protein foods to meet 60g goal Increase fluid intake to 64oz + Avoid drinking 15 minutes before, during and 30 minutes after eating Aim for >30 min of physical activity daily Try protein like [place] sausage, try cottage cheese with salsa, chicken/egg salad, cheese/lunch meat roll up, Quest protein chips, white beans  Continue to include protein foods with each meal and snack and eat those foods first Try Citracal Petites (3x a day) Increase activity when it cools off Continue to keep good habits!! (balance)  Surgery date: 08/20/14 Surgery type: Gastric sleeve Start weight at [facility]: 278 lbs on 06/27/14 Weight today: 189 lbs Weight change: 6 lbs Total weight loss: 89 lbs Weight loss goal: 175-180 lbs  Habits to Keep: -No Diet Coke -Being active and enjoying it! -More aware of carbs and protein intake (feeding your body healthy things) -Cooking more at home -Limiting/avoiding sweets -Taking 1/2 of meal home from a restaurant

## 2015-11-05 NOTE — Progress Notes (Signed)
  Follow-up visit:  15 months Post-Operative Sleeve Gastrectomy Surgery  Medical Nutrition Therapy:  Appt start time: 1100 end time: 1145  Primary concerns today: Post-operative Bariatric Surgery Nutrition Management. Returns having lost another 6 lbs. Has been on prednisone and recently drove to and from [place]. Is up a few pounds than she has been at home. [name] reports she is feeling well overall, "I feel like I did when I was younger." Finding herself able to walk much further. Noticing weight loss plateaus. Finds that she can tolerate more volume and feels like she's eating too many carbs. Not having vomiting.   Goal: size 14 in pants and medium in shirts (Goal has been met!)  Surgery date: 08/20/14 Surgery type: Gastric sleeve Start weight at [facility]: 278 lbs on 06/27/14 Weight today: 189 lbs Weight change: 6 lbs Total weight loss: 89 lbs Weight loss goal: 175-180 lbs  Preferred Learning Style:   No preference indicated   Learning Readiness:   Ready  24-hr recall: B (10 AM): Nature's grain cereal with 2% milk OR Danton Clap Frittatas OR Danton Clap Delights egg white English muffin Snk (AM): usually none L (PM): pimento cheese sandwich OR chicken salad sandwich OR hummus and 6 pita chips (10-14g) Snk (PM): hummus with 3 Pita chips D (PM): 2-2.5 oz steak with baked potato OR grilled chicken salad Snk (PM): part of protein bar or sugar free popsicle  Fluid intake: 100 oz Propel, decaf tea with sweet n low Estimated total protein intake: 30-50 grams per day  Medications: see list; no longer taking Eliqius or pain meds Supplementation: taking multivitamin but not Calcium  Using straws: Yes Drinking while eating: sips Hair loss: No, having regrowth  Carbonated beverages: none; tried a sip of Diet Coke and did not like N/V/D/C: nausea and gagging have improved Dumping syndrome: No  Recent physical activity:  Walking with grandson 2-3 days a week  Progress Towards Goal(s):   In progress.  Handouts given during visit include:  None   Nutritional Diagnosis:  -3.3 Overweight/obesity related to past poor dietary habits and physical inactivity as evidenced by patient w/ recent sleeve gastrectomy surgery following dietary guidelines for continued weight loss.    Intervention:  Nutrition education/diet advancement.  Teaching Method Utilized:  Visual Auditory Hands on  Barriers to learning/adherence to lifestyle change: none  Demonstrated degree of understanding via:  Teach Back   Monitoring/Evaluation:  Dietary intake, exercise, and body weight. Follow up in 3 months for 18 month post-op visit.

## 2015-11-13 ENCOUNTER — Telehealth: Payer: Self-pay | Admitting: Gastroenterology

## 2015-11-13 NOTE — Telephone Encounter (Signed)
Patient has not been seen since 08/2013 by Nicoletta Ba, PA and would like a refill of pantprazole until appt. Can I refill?

## 2015-11-14 MED ORDER — PANTOPRAZOLE SODIUM 40 MG PO TBEC: 40.0000 mg | DELAYED_RELEASE_TABLET | Freq: Two times a day (BID) | ORAL | 0 refills | Status: DC

## 2015-11-14 NOTE — Telephone Encounter (Signed)
Patient return my call and I told her that I will send her refill to her pharmacy but to keep her follow up with Dr. Havery Moros. Patient verbalized understanding.

## 2015-11-14 NOTE — Telephone Encounter (Signed)
Left a message for patient to return my call. 

## 2015-11-14 NOTE — Telephone Encounter (Signed)
You can refill it it, that's fine, but if we are prescribing long term PPI and the patient has not been seen in 2 years, given some of the recent data in the news about PPI, she should see [place] in clinic to discuss long term PPI risks and discuss long term regimen if you can help coordinate a follow up. Thanks

## 2015-12-25 DIAGNOSIS — Z23 Encounter for immunization: Secondary | ICD-10-CM | POA: Diagnosis not present

## 2016-01-14 ENCOUNTER — Ambulatory Visit (INDEPENDENT_AMBULATORY_CARE_PROVIDER_SITE_OTHER): Payer: Medicare Other | Admitting: Gastroenterology

## 2016-01-14 ENCOUNTER — Encounter: Payer: Self-pay | Admitting: Gastroenterology

## 2016-01-14 VITALS — BP 126/80 | HR 72 | Ht 63.75 in | Wt 185.0 lb

## 2016-01-14 DIAGNOSIS — K219 Gastro-esophageal reflux disease without esophagitis: Secondary | ICD-10-CM | POA: Diagnosis not present

## 2016-01-14 DIAGNOSIS — Z1211 Encounter for screening for malignant neoplasm of colon: Secondary | ICD-10-CM | POA: Diagnosis not present

## 2016-01-14 MED ORDER — PANTOPRAZOLE SODIUM 40 MG PO TBEC: 40.0000 mg | DELAYED_RELEASE_TABLET | Freq: Every day | ORAL | 0 refills | Status: DC

## 2016-01-14 NOTE — Patient Instructions (Signed)
If you are age 68 or older, your body mass index should be between 23-30. Your Body mass index is 32 kg/m. If this is out of the aforementioned range listed, please consider follow up with your Primary Care Provider.  If you are age 1 or younger, your body mass index should be between 19-25. Your Body mass index is 32 kg/m. If this is out of the aformentioned range listed, please consider follow up with your Primary Care Provider.   We have sent the following medications to your pharmacy for you to pick up at your convenience: Protonix 40mg . Take 40mg  once daily for 2 weeks and if symptoms improve decrease to 20mg  daily for 2 weeks. If symptoms still improved take 20mg  every other day.  You will be scheduled for a colon recall in January of 2019.  We will request lab work from Dr Brigitte Pulse and will call you if needed.

## 2016-01-14 NOTE — Progress Notes (Signed)
HPI :  70 year old female with past medical history as outlined below, here for follow-up visit. She is a new patient to me she has not been seen here since 2015.   Patient has been following with [place] over time for reflux. She had a gastric sleeve done last year for obesity, with repair of large hiatal hernia. She reports her smyptoms have been well controlled for reflux while on PPI. She has been on protonix 40mg  BID for a very long terim. She reports post-surgery she had some nausea which has since resolved. She reported historical symptoms were heartburn and discomfort. She denies any dysphagia. She has lost about 100 lbs since the sleeve surgery. She has had her hips replaced and did well with this. She is s/p cholecystectomy. Overall she is feeling really well right now. She states after hiatal hernia repair and gastric sleeve she has not tried coming off PPI and continues 40mg  BID.   She has had multiple endoscopies in the past, no evidence of BE in the past. Both of her parents died of esophageal cancer, significant tobacco use - father was 76 when diagnosed, mother was in her 70s when she got it.   She otherwise asked when she is next due for colon cancer screening, which is not until 2019. She denies any blood in stools or problems with bowel habits.   Barium 2015 - small hiatal hernia, no dysphagia PH study 03/13/13 - [place] 1.4 Normal EGD 03/06/13 - bravo placement EGD 11/16/12 - large hiatal hernia, polypectomy  EGD 01/19/2008 - gastritis Colonoscopy 04/10/2007 - extensive diverticulosis  Past Medical History:  Diagnosis Date  . Antral polyp    benign  . Anxiety   . Arthritis   . Asthma   . Depression   . Diverticulosis   . GERD (gastroesophageal reflux disease) 09/09/2011  . History of transfusion   . HTN (hypertension) 09/09/2011  . Hyperlipemia 09/09/2011  . Hypertension   . IBS (irritable bowel syndrome)   . Internal hemorrhoid   . Obesity   . Paroxysmal atrial  fibrillation (HCC)   . PONV (postoperative nausea and vomiting)    with big surgeries, none with endos etc, with colonoscopy- 5-6 years ago could not swallow   . Shortness of breath   . Sleep apnea    borderline per patient no cpap   . Tachycardia   . Unstable angina ([place]) 09/09/2011  . Vocal cord dysfunction 2013   swelling     Past Surgical History:  Procedure Laterality Date  . ABDOMINAL HYSTERECTOMY  1988  . ANKLE FUSION Right    x 2  . BACK SURGERY  [phone]   3 ruptured discs, lower back  . BILATERAL ANTERIOR TOTAL HIP ARTHROPLASTY Bilateral 11/12/2014   Procedure: BILATERAL ANTERIOR TOTAL HIP ARTHROPLASTY;  Surgeon: Paralee Cancel, MD;  Location: [facility];  Service: Orthopedics;  Laterality: Bilateral;  . BRAVO [place] STUDY [name] 03/06/2013   Procedure: BRAVO [place] STUDY;  Surgeon: Inda Castle, MD;  Location: [facility];  Service: Endoscopy;  Laterality: [name];  . CARDIAC CATHETERIZATION  2013   NORMAL  . CHOLECYSTECTOMY    . ELBOW SURGERY Left    removed bone chip  . ESOPHAGOGASTRODUODENOSCOPY [name] 03/06/2013   Procedure: ESOPHAGOGASTRODUODENOSCOPY (EGD);  Surgeon: Inda Castle, MD;  Location: [facility];  Service: Endoscopy;  Laterality: [name];  . FOOT OSTEOTOMY W/ PLANTAR FASCIA RELEASE Left   . hip ilioban release Right 1997  . LAPAROSCOPIC GASTRIC SLEEVE RESECTION WITH HIATAL HERNIA  REPAIR  08/20/2014   Procedure: LAPAROSCOPIC GASTRIC SLEEVE RESECTION WITH HIATAL HERNIA  REPAIR AND  UPPER ENDOSCOPY;  Surgeon: Johnathan Hausen, MD;  Location: [facility];  Service: General;;  . LEFT HEART CATHETERIZATION WITH CORONARY ANGIOGRAM Bilateral 09/10/2011   Procedure: LEFT HEART CATHETERIZATION WITH CORONARY ANGIOGRAM;  Surgeon: Lorretta Harp, MD;  Location: [facility] CATH LAB;  Service: Cardiovascular;  Laterality: Bilateral;  . NASAL SINUS SURGERY  1997  . SHOULDER SURGERY Right 1987  . tfc wrist Left 1997  . thumb replacement Bilateral    one 2009 and one 2014  . TOTAL ANKLE  REPLACEMENT Right    x 3  . TOTAL ANKLE REPLACEMENT Right 2012  . TOTAL KNEE ARTHROPLASTY Bilateral    left 2002, right 2012  . TRANSTHORACIC ECHOCARDIOGRAM  09/09/2011   MILD CONCENTRIC LVH. TRACE MITRAL REGURG. TRACE TR. MILD AORTIC REGURG.  . 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 UPPER GI ENDOSCOPY  08/20/2014   Procedure: UPPER GI ENDOSCOPY;  Surgeon: Johnathan Hausen, MD;  Location: [facility];  Service: General;;  . WRIST SURGERY Left 1997, 2009    Family History  Problem Relation Age of Onset  . Hypertension Mother   . Diabetes Mother   . Arthritis Mother   . Esophageal cancer Mother   . Esophageal cancer Father 11  . Other Sister     kidney mass removed  . Hypertension Sister   . Heart disease Maternal Grandmother   . Stroke Paternal Grandmother   . Coronary artery disease Neg Hx   . Colon cancer Neg Hx   . Liver disease Neg Hx   . Kidney disease Neg Hx    Social History  Substance Use Topics  . Smoking status: Never Smoker  . Smokeless tobacco: Never Used  . Alcohol use No   Current Outpatient Prescriptions  Medication Sig Dispense Refill  . acetaminophen (TYLENOL) 500 MG tablet Take 500 mg by mouth every 6 (six) hours as needed.    . clorazepate (TRANXENE) 7.5 MG tablet Take 7.5 mg by mouth 3 (three) times daily as needed for anxiety.    [name] diltiazem (CARTIA XT) 180 MG 24 hr capsule Take 1 capsule (180 mg total) by mouth daily. 30 capsule 11  . DULoxetine (CYMBALTA) 60 MG capsule Take 1 capsule by mouth daily with supper.    . ondansetron (ZOFRAN) 4 MG tablet Take 4 mg by mouth every 8 (eight) hours as needed for nausea or vomiting.    . pantoprazole (PROTONIX) 40 MG tablet Take 1 tablet (40 mg total) by mouth 2 (two) times daily. 180 tablet 0  . Probiotic Product (RESTORA) CAPS Take 1 capsule by mouth daily. (Patient taking differently: Take 1 capsule by mouth daily with supper. ) 30 capsule 2  . simvastatin (ZOCOR) 20 MG tablet Take 20 mg by mouth daily.    .  traMADol (ULTRAM) 50 MG tablet Take by mouth every 6 (six) hours as needed.    . vitamin B-12 (CYANOCOBALAMIN) 1000 MCG tablet Take 1,000 mcg by mouth daily.    [name] docusate sodium (COLACE) 100 MG capsule Take 1 capsule (100 mg total) by mouth 2 (two) times daily. (Patient not taking: Reported on 01/14/2016) 10 capsule 0  . losartan (COZAAR) 50 MG tablet Take 50 mg by mouth daily.      No current facility-administered medications for this visit.    Allergies  Allergen Reactions  . Ativan [Lorazepam] Other (See Comments)  Agitation, aggressive actions, significant mental changes  . Fentanyl Itching  . Morphine And Related Itching and Rash     Review of Systems: All systems reviewed and negative except where noted in HPI.   Lab Results  Component Value Date   CREATININE 0.69 11/14/2014   BUN 14 11/14/2014   NA 138 11/14/2014   K 4.4 11/14/2014   CL 109 11/14/2014   CO2 25 11/14/2014     Physical Exam: BP 126/80   Pulse 72   Ht 5' 3.75" (1.619 m)   Wt 185 lb (83.9 kg)   BMI 32.00 kg/m  Constitutional: Pleasant,well-developed, female in no acute distress. HEENT: Normocephalic and atraumatic. Conjunctivae are normal. No scleral icterus. Neck supple.  Cardiovascular: Normal rate, regular rhythm.  Pulmonary/chest: Effort normal and breath sounds normal. No wheezing, rales or rhonchi. Abdominal: Soft, nondistended, nontender. There are no masses palpable. No hepatomegaly. Extremities: no edema Lymphadenopathy: No cervical adenopathy noted. Neurological: Alert and oriented to person place and time. Skin: Skin is warm and dry. No rashes noted. Psychiatric: Normal mood and affect. Behavior is normal.   ASSESSMENT AND PLAN: 70 year old female with a history of reflux and obesity status post gastric sleeve surgery with subsequent significant weight loss, here for follow-up for reflux.  Overall she has been on long-standing high-dose Protonix for years. Her reflux symptoms are  well-controlled on this regimen, but she has never tried coming down on the dose, even after her surgery. I discussed the long-term risks of PPIs with her at length and do think it is warranted to reduce dose in attempt to wean her off medication if possible. For now we'll decrease Protonix to 40 mg once a day for 2 weeks. If she does well with this we will further reduce her dose to 20 mg per day for 2 weeks. If she continues to do well on low-dose dosing, she can then take Protonix 20 mg every other day for 2 weeks, and if symptoms remain well controlled she can then stop Protonix and use as needed. Overall I counseled her to use the lowest daily dose of Protonix needed to control her reflux and wean off completely if possible. We have put in a request to her primary care for basic lab work which she has had done recently to ensure normal renal function. Of note, she has had multiple EGDs without any evidence of Barrett's, although her FH is noted (both parents with esophageal cancer). I don't feel strongly she needs another EGD now given negative exams in the past, last in 2014, but can consider it in the future  Regarding colon cancer screening she is not due until 2019. We discussed options to include optical colonoscopy and stool testing, to include risks and benefits of each. She adamantly does not want a colonoscopy if at all possible, she doesn't think she can drink the prep after gastric bypass, and wanted to proceed with Cologuard after our discussion. Recall for Cologard in January 2019 as long as she remains asymptomatic  She agreed with the plan, all questions answered. Follow-up as needed  [place] Cellar, MD [facility] Pager [phone]

## 2016-01-21 DIAGNOSIS — M19011 Primary osteoarthritis, right shoulder: Secondary | ICD-10-CM | POA: Diagnosis not present

## 2016-01-30 DIAGNOSIS — Z903 Acquired absence of stomach [part of]: Secondary | ICD-10-CM | POA: Diagnosis not present

## 2016-02-04 ENCOUNTER — Encounter: Payer: Medicare Other | Attending: Surgery | Admitting: Dietician

## 2016-02-04 ENCOUNTER — Encounter: Payer: Self-pay | Admitting: Dietician

## 2016-02-04 DIAGNOSIS — Z6841 Body Mass Index (BMI) 40.0 and over, adult: Secondary | ICD-10-CM

## 2016-02-04 DIAGNOSIS — Z713 Dietary counseling and surveillance: Secondary | ICD-10-CM | POA: Diagnosis not present

## 2016-02-04 NOTE — Progress Notes (Signed)
  Follow-up visit:  1.5 years Post-Operative Sleeve Gastrectomy Surgery  Medical Nutrition Therapy:  Appt start time: 6659 end time: 1200  Primary concerns today: Post-operative Bariatric Surgery Nutrition Management. [name] returns having maintained her weight. She found out she no longer has any cartilage in her shoulder and has been getting cortisone shots; may have to have surgery. Recently went to [place] on vacation and is excited to reports she was able to walk much further. Also able to cook more easily. Still has an aversion to sweet tastes. States that she is still conscious of meeting protein needs and meeting fluid needs. Weaning off of protonix and has not had any problems. Reduced blood pressure medicine.    Goal: size 14 in pants and medium in shirts (Goal has been met!)  Surgery date: 08/20/14 Surgery type: Gastric sleeve Start weight at [facility]: 278 lbs on 06/27/14 Weight today: 179.2 lbs Weight change: 0 lbs Total weight loss: 100 lbs Weight loss goal: 175-180 lbs  Preferred Learning Style:   No preference indicated   Learning Readiness:   Ready  24-hr recall: B (10 AM): Nature's grain cereal with 2% milk OR Danton Clap Frittatas OR Danton Clap Delights egg white English muffin Snk (AM): usually none L (PM): pimento cheese sandwich OR chicken salad sandwich OR hummus and 6 pita chips (10-14g) Snk (PM): hummus with 3 Pita chips D (PM): 2-2.5 oz steak with baked potato OR grilled chicken salad Snk (PM): grapes or banana with peanut butter, decaf coffee  Fluid intake: 100 oz Propel, decaf tea with sweet n low Estimated total protein intake: 30-50 grams per day  Medications: see list; no longer taking Eliqius or pain meds Supplementation: taking multivitamin but not Calcium  Using straws: Yes Drinking while eating: sips Hair loss: No, having regrowth  Carbonated beverages: none; tried a sip of Diet Coke and did not like N/V/D/C: nausea and gagging have  improved Dumping syndrome: No  Recent physical activity:  Walking with grandson 2-3 days a week  Progress Towards Goal(s):  In progress.  Handouts given during visit include:  None   Nutritional Diagnosis:  [place]-3.3 Overweight/obesity related to past poor dietary habits and physical inactivity as evidenced by patient w/ recent sleeve gastrectomy surgery following dietary guidelines for continued weight loss.    Intervention:  Nutrition education/diet advancement.  Teaching Method Utilized:  Visual Auditory Hands on  Barriers to learning/adherence to lifestyle change: none  Demonstrated degree of understanding via:  Teach Back   Monitoring/Evaluation:  Dietary intake, exercise, and body weight. Follow up in 3 months.

## 2016-02-04 NOTE — Patient Instructions (Signed)
Goals: Follow Phase 3B: High Protein + Non-Starchy Vegetables Eat 3-6 small meals/snacks, every 3-5 hrs Increase lean protein foods to meet 60g goal Increase fluid intake to 64oz + Avoid drinking 15 minutes before, during and 30 minutes after eating Aim for >30 min of physical activity daily Try protein like [place] sausage, try cottage cheese with salsa, chicken/egg salad, cheese/lunch meat roll up, [employer] protein chips, white beans  Continue to include protein foods with each meal and snack and eat those foods first Try Citracal Petites (3x a day) Increase activity when it cools off Continue to keep good habits!! (balance)  Surgery date: 08/20/14 Surgery type: Gastric sleeve Start weight at [facility]: 278 lbs on 06/27/14 Weight today: 189 lbs Weight change: 6 lbs Total weight loss: 89 lbs Weight loss goal: 175-180 lbs  Habits to Keep: -No Diet Coke -Being active and enjoying it! -More aware of carbs and protein intake (feeding your body healthy things) -Cooking more at home -Limiting/avoiding sweets -Taking 1/2 of meal home from a restaurant

## 2016-02-18 DIAGNOSIS — M19011 Primary osteoarthritis, right shoulder: Secondary | ICD-10-CM | POA: Diagnosis not present

## 2016-02-20 DIAGNOSIS — H53143 Visual discomfort, bilateral: Secondary | ICD-10-CM | POA: Diagnosis not present

## 2016-02-20 DIAGNOSIS — H2513 Age-related nuclear cataract, bilateral: Secondary | ICD-10-CM | POA: Diagnosis not present

## 2016-03-01 DIAGNOSIS — H538 Other visual disturbances: Secondary | ICD-10-CM | POA: Diagnosis not present

## 2016-03-01 DIAGNOSIS — H539 Unspecified visual disturbance: Secondary | ICD-10-CM | POA: Diagnosis not present

## 2016-03-10 DIAGNOSIS — M25511 Pain in right shoulder: Secondary | ICD-10-CM | POA: Diagnosis not present

## 2016-03-10 DIAGNOSIS — M19011 Primary osteoarthritis, right shoulder: Secondary | ICD-10-CM | POA: Diagnosis not present

## 2016-03-24 DIAGNOSIS — M19011 Primary osteoarthritis, right shoulder: Secondary | ICD-10-CM | POA: Diagnosis not present

## 2016-03-25 ENCOUNTER — Telehealth: Payer: Self-pay | Admitting: Cardiovascular Disease

## 2016-03-25 NOTE — Telephone Encounter (Signed)
Requesting surgical clearance:  1. Type of surgery: Right Shoulder Reverse TSA  2. Surgeon: Esmond Plants, MD  3.Surgical Date: pending   4. Medications that need to be held: none specified   5. CAD: Yes  6. I will defer to:  Dr. Pearla Dubonnet Information:   [facility] Phone: [phone] Fax:  [phone]

## 2016-03-26 NOTE — Telephone Encounter (Signed)
Cleared for shoulder surgery at low risk. Okay to stop her Eliquis 3 days prior

## 2016-03-26 NOTE — Telephone Encounter (Signed)
Routed to number provided via EPIC. 

## 2016-03-31 ENCOUNTER — Telehealth: Payer: Self-pay | Admitting: Cardiovascular Disease

## 2016-03-31 NOTE — Telephone Encounter (Signed)
She needs to know if you received a surgical clearance from Dr Veverly Fells at [facility]? If you did,have it been faxed back?

## 2016-03-31 NOTE — Telephone Encounter (Signed)
Returned call to patient-advised Dr. Gwenlyn Found cleared her for procedure and primary sent clearance via Epic on 1/5 (see telephone note).    Pt verbalized understanding.

## 2016-04-02 DIAGNOSIS — I10 Essential (primary) hypertension: Secondary | ICD-10-CM | POA: Diagnosis not present

## 2016-04-02 DIAGNOSIS — R7301 Impaired fasting glucose: Secondary | ICD-10-CM | POA: Diagnosis not present

## 2016-04-02 DIAGNOSIS — E784 Other hyperlipidemia: Secondary | ICD-10-CM | POA: Diagnosis not present

## 2016-04-02 DIAGNOSIS — E559 Vitamin D deficiency, unspecified: Secondary | ICD-10-CM | POA: Diagnosis not present

## 2016-04-09 DIAGNOSIS — H539 Unspecified visual disturbance: Secondary | ICD-10-CM | POA: Diagnosis not present

## 2016-04-09 DIAGNOSIS — J45901 Unspecified asthma with (acute) exacerbation: Secondary | ICD-10-CM | POA: Diagnosis not present

## 2016-04-09 DIAGNOSIS — Z0000 Encounter for general adult medical examination without abnormal findings: Secondary | ICD-10-CM | POA: Diagnosis not present

## 2016-04-09 DIAGNOSIS — R7301 Impaired fasting glucose: Secondary | ICD-10-CM | POA: Diagnosis not present

## 2016-04-09 DIAGNOSIS — Z1389 Encounter for screening for other disorder: Secondary | ICD-10-CM | POA: Diagnosis not present

## 2016-04-09 DIAGNOSIS — K589 Irritable bowel syndrome without diarrhea: Secondary | ICD-10-CM | POA: Diagnosis not present

## 2016-04-09 DIAGNOSIS — E668 Other obesity: Secondary | ICD-10-CM | POA: Diagnosis not present

## 2016-04-09 DIAGNOSIS — I10 Essential (primary) hypertension: Secondary | ICD-10-CM | POA: Diagnosis not present

## 2016-04-09 DIAGNOSIS — Z8679 Personal history of other diseases of the circulatory system: Secondary | ICD-10-CM | POA: Diagnosis not present

## 2016-04-09 DIAGNOSIS — Z9884 Bariatric surgery status: Secondary | ICD-10-CM | POA: Diagnosis not present

## 2016-04-09 DIAGNOSIS — N3941 Urge incontinence: Secondary | ICD-10-CM | POA: Diagnosis not present

## 2016-04-09 DIAGNOSIS — E784 Other hyperlipidemia: Secondary | ICD-10-CM | POA: Diagnosis not present

## 2016-04-16 ENCOUNTER — Telehealth: Payer: Self-pay | Admitting: Gastroenterology

## 2016-04-16 ENCOUNTER — Other Ambulatory Visit: Payer: Self-pay

## 2016-04-16 ENCOUNTER — Telehealth: Payer: Self-pay

## 2016-04-16 MED ORDER — SUCRALFATE 1 G PO TABS: 1.0000 g | ORAL_TABLET | Freq: Three times a day (TID) | ORAL | 0 refills | Status: DC

## 2016-04-16 MED ORDER — SUCRALFATE 1 GM/10ML PO SUSP: 1.0000 g | Freq: Three times a day (TID) | ORAL | 0 refills | Status: DC

## 2016-04-16 NOTE — Telephone Encounter (Signed)
Spoke with Dr Carlean Purl and he approved changing the carafate liquid to sucrafate tablets so insurance with cover it, sent in and called pharmacy to cancel the liquid.  Patient can dissolve the tablet in 1 tablespoon of water to make a slurry.

## 2016-04-16 NOTE — Telephone Encounter (Signed)
1) Strict reflux diet and stay on liquid/soft foods for a few days at least -  2) pantoprazole 40 mg daily before breakfast 3) carafate 1 g qac/HS - try suspension  rx 10 cc = 1 g but if not covered use tablets - # 1 month supply no refill

## 2016-04-16 NOTE — Telephone Encounter (Signed)
Patient advised of Dr. Celesta Aver recommendations. Rx sent in for liquid carafate. Patient will start taking her Protonix prior to breakfast. I have gone over what she should eat/foods to avoid. I have also mailed her reflux diet information. Advised to keep her appointment.

## 2016-04-16 NOTE — Telephone Encounter (Signed)
Patient having increased symptoms of reflux for two days. Patient is s/p gastric sleeve, she ate [place]western chili the other night, then had to take Protonix. Ate pizza last night, had to take two Protonix since then. Now it does not matter what she eats she is have severe reflux burning. Up until this time she had weaned herself down to only needing the Protonix prn. She is scheduled to see APP on 2/5. Please advise.

## 2016-04-19 ENCOUNTER — Telehealth: Payer: Self-pay | Admitting: Gastroenterology

## 2016-04-19 DIAGNOSIS — M859 Disorder of bone density and structure, unspecified: Secondary | ICD-10-CM | POA: Diagnosis not present

## 2016-04-19 DIAGNOSIS — M159 Polyosteoarthritis, unspecified: Secondary | ICD-10-CM | POA: Diagnosis not present

## 2016-04-19 NOTE — Telephone Encounter (Signed)
Please see note from 1/26.

## 2016-04-19 NOTE — Telephone Encounter (Signed)
Almyra Free can you clarify what symptoms she is reporting, is it heartburn that is persisting despite protonix 40mg  daily and liquid carafate? If so, recommend she increase her protonix to 40mg  twice daily. If she is not having reflux but different symptoms can you please clarify. Thanks

## 2016-04-20 ENCOUNTER — Ambulatory Visit: Payer: Self-pay | Admitting: Gastroenterology

## 2016-04-20 NOTE — Telephone Encounter (Signed)
She states that this all started last week after eating spicy chili on one day, then the next night ate pizza. Has epigastric pain that radiates to her back on right side. She states that even when her stomach is empty it hurts. She states it hurts right after eating too. She is only taking protonix 40 mg once daily, carafate four times a day. She is s/p gastric sleeve and states she needs to eat every 3 hours, so last snack at night is around 9:30-10:00. She has appointment with Amy E. on 2/5.

## 2016-04-20 NOTE — Telephone Encounter (Signed)
Okay thanks for clarifying. Can you have her come to the lab for CBC, CMET, and lipase to ensure normal, we will let her know results. She has had her gallbladder removed. She can increase protonix to 40mg  BID to see if that helps and keep her office appointment. Thanks

## 2016-04-21 ENCOUNTER — Other Ambulatory Visit: Payer: Self-pay

## 2016-04-21 ENCOUNTER — Other Ambulatory Visit (INDEPENDENT_AMBULATORY_CARE_PROVIDER_SITE_OTHER): Payer: Medicare Other

## 2016-04-21 DIAGNOSIS — R1013 Epigastric pain: Secondary | ICD-10-CM

## 2016-04-21 LAB — CBC WITH DIFFERENTIAL/PLATELET
BASOS PCT: 0.5 % (ref 0.0–3.0)
Basophils Absolute: 0 10*3/uL (ref 0.0–0.1)
Eosinophils Absolute: 0.1 10*3/uL (ref 0.0–0.7)
Eosinophils Relative: 0.8 % (ref 0.0–5.0)
HEMATOCRIT: 38.5 % (ref 36.0–46.0)
HEMOGLOBIN: 13.1 g/dL (ref 12.0–15.0)
LYMPHS PCT: 31.5 % (ref 12.0–46.0)
Lymphs Abs: 2 10*3/uL (ref 0.7–4.0)
MCHC: 33.9 g/dL (ref 30.0–36.0)
MCV: 95.1 fl (ref 78.0–100.0)
MONOS PCT: 7.8 % (ref 3.0–12.0)
Monocytes Absolute: 0.5 10*3/uL (ref 0.1–1.0)
Neutro Abs: 3.8 10*3/uL (ref 1.4–7.7)
Neutrophils Relative %: 59.4 % (ref 43.0–77.0)
Platelets: 185 10*3/uL (ref 150.0–400.0)
RBC: 4.05 Mil/uL (ref 3.87–5.11)
RDW: 12.9 % (ref 11.5–15.5)
WBC: 6.3 10*3/uL (ref 4.0–10.5)

## 2016-04-21 LAB — COMPREHENSIVE METABOLIC PANEL
ALBUMIN: 3.5 g/dL (ref 3.5–5.2)
ALT: 12 U/L (ref 0–35)
AST: 14 U/L (ref 0–37)
Alkaline Phosphatase: 61 U/L (ref 39–117)
BILIRUBIN TOTAL: 0.5 mg/dL (ref 0.2–1.2)
BUN: 21 mg/dL (ref 6–23)
CALCIUM: 8.8 mg/dL (ref 8.4–10.5)
CHLORIDE: 107 meq/L (ref 96–112)
CO2: 28 mEq/L (ref 19–32)
CREATININE: 0.88 mg/dL (ref 0.40–1.20)
GFR: 67.58 mL/min (ref >60.00)
Glucose, Bld: 98 mg/dL (ref 70–99)
Potassium: 4.5 mEq/L (ref 3.5–5.1)
Sodium: 140 mEq/L (ref 135–145)
Total Protein: 5.9 g/dL — ABNORMAL LOW (ref 6.0–8.3)

## 2016-04-21 LAB — LIPASE: LIPASE: 29 U/L (ref 11.0–59.0)

## 2016-04-21 NOTE — Telephone Encounter (Signed)
Spoke to patient let her know that Dr. Havery Moros would like to get some lab work on her today, orders placed in [place]. She will also increase her Protonix 40 mg to BID and follow up on 2/5 appointment.

## 2016-04-21 NOTE — Telephone Encounter (Signed)
Patient calling back regarding this. Best # [phone]

## 2016-04-22 ENCOUNTER — Telehealth: Payer: Self-pay | Admitting: Gastroenterology

## 2016-04-22 NOTE — Telephone Encounter (Signed)
Patient advised that lab work is completely normal. She stated that if the flu was not around in the ED she would go there. I told her that if she thought she needed to go to the ED, then that is what she needs to do, otherwise will see her at scheduled appointment on 2/5.

## 2016-04-26 ENCOUNTER — Ambulatory Visit (INDEPENDENT_AMBULATORY_CARE_PROVIDER_SITE_OTHER): Payer: Medicare Other | Admitting: Physician Assistant

## 2016-04-26 ENCOUNTER — Telehealth: Payer: Self-pay | Admitting: *Deleted

## 2016-04-26 ENCOUNTER — Encounter: Payer: Self-pay | Admitting: Physician Assistant

## 2016-04-26 VITALS — BP 90/60 | HR 88 | Ht 65.5 in | Wt 183.0 lb

## 2016-04-26 DIAGNOSIS — R1011 Right upper quadrant pain: Secondary | ICD-10-CM

## 2016-04-26 DIAGNOSIS — Z9884 Bariatric surgery status: Secondary | ICD-10-CM

## 2016-04-26 DIAGNOSIS — R1013 Epigastric pain: Secondary | ICD-10-CM

## 2016-04-26 DIAGNOSIS — Z9049 Acquired absence of other specified parts of digestive tract: Secondary | ICD-10-CM

## 2016-04-26 DIAGNOSIS — R110 Nausea: Secondary | ICD-10-CM

## 2016-04-26 MED ORDER — SUCRALFATE 1 G PO TABS: 1.0000 g | ORAL_TABLET | Freq: Three times a day (TID) | ORAL | 1 refills | Status: DC

## 2016-04-26 MED ORDER — PANTOPRAZOLE SODIUM 40 MG PO TBEC: 40.0000 mg | DELAYED_RELEASE_TABLET | Freq: Every day | ORAL | 0 refills | Status: DC

## 2016-04-26 NOTE — Telephone Encounter (Signed)
I advised the patient per Nicoletta Ba PA, She can stay on the Carafate for another 2 weeks.  Decrease the Protonix 40 mg to once daily.  I told her Amy may alter these directions when we call the patient with her CT results.

## 2016-04-26 NOTE — Progress Notes (Signed)
Subjective:    Patient ID: [name], female    DOB: 1945/11/17, 71 y.o.   MRN: [id number]  HPI [name] is a pleasant 71 year old white female, known to Dr. Havery Moros who was last seen in the office in October 2017. She has history of hypertension, atrial fibrillation, asthma, diverticulosis, prior cholecystectomy, and is status post gastric sleeve procedure in 2016 for obesity. She has lost about 100 pounds. She also had repair of a large hiatal hernia at that same time. At the time of her last office visit she was started to be weaned off of high-dose Protonix. She says she had been off of Protonix for about 3 weeks when she had an episode 2 weeks ago of acute epigastric pain bloating and then diarrhea after she had eaten dinner out with a chicken chili. She says the following day she continued to feel uncomfortable and 81 piece of pizza which immediately caused "bad pain again. She describes it as a sharp gnawing type of feeling in a bandlike pattern across her upper abdomen and more so into her right upper quadrant. She had some vague nausea but no vomiting, no fever or chills. She says the pain was bad enough that she wanted to go to the emergency room. She called here and was started on Carafate 1 g 4 times a day and place back on twice a day Protonix. She had felt a little bit better this but this morning even after eating cereal she had what she describes as bad pain under her right ribs. Prior to onset of all these acute symptoms she had just taken a course of erythromycin and prednisone for an upper respiratory infection. She is not  on any NSAIDs. Labs done last week on 04/20/2016 with CBC ,CMET, and lipase all unremarkable.  Review of Systems Pertinent positive and negative review of systems were noted in the above HPI section.  All other review of systems was otherwise negative.  Outpatient Encounter Prescriptions as of 04/26/2016  Medication Sig  . acetaminophen (TYLENOL) 500 MG  tablet Take 500 mg by mouth every 6 (six) hours as needed.  . Biotin 10000 MCG TABS Take 1 tablet by mouth daily.  . clorazepate (TRANXENE) 7.5 MG tablet Take 7.5 mg by mouth 3 (three) times daily as needed for anxiety.  [name] diltiazem (CARTIA XT) 180 MG 24 hr capsule Take 1 capsule (180 mg total) by mouth daily.  [name] docusate sodium (COLACE) 100 MG capsule Take 1 capsule (100 mg total) by mouth 2 (two) times daily.  . DULoxetine (CYMBALTA) 60 MG capsule Take 1 capsule by mouth daily with supper.  . losartan (COZAAR) 50 MG tablet Take 50 mg by mouth daily.   . Multiple Vitamin (MULTIVITAMIN) tablet Take 1 tablet by mouth daily.  . ondansetron (ZOFRAN) 4 MG tablet Take 4 mg by mouth every 8 (eight) hours as needed for nausea or vomiting.  . pantoprazole (PROTONIX) 40 MG tablet Take 1 tablet (40 mg total) by mouth daily. Take 1 tablet daily every morning.  . Probiotic Product (RESTORA) CAPS Take 1 capsule by mouth daily. (Patient taking differently: Take 1 capsule by mouth daily with supper. )  . simvastatin (ZOCOR) 20 MG tablet Take 20 mg by mouth daily.  . sucralfate (CARAFATE) 1 g tablet Take 1 tablet (1 g total) by mouth 4 (four) times daily -  with meals and at bedtime. May dissolve tablet in 1 tablespoon of water to take as a slurry  . traMADol (  ULTRAM) 50 MG tablet Take by mouth every 6 (six) hours as needed.  . vitamin B-12 (CYANOCOBALAMIN) 1000 MCG tablet Take 1,000 mcg by mouth daily.  . [DISCONTINUED] pantoprazole (PROTONIX) 40 MG tablet Take 1 tablet (40 mg total) by mouth daily. Take one tablet daily for 2 weeks then decrease to 20mg  daily for 2 weeks then 20mg  every other day as long as symptoms are improving (Patient taking differently: Take 40 mg by mouth 2 (two) times daily. )  . [DISCONTINUED] sucralfate (CARAFATE) 1 g tablet Take 1 tablet (1 g total) by mouth 4 (four) times daily -  with meals and at bedtime. May dissolve tablet in 1 tablespoon of water to take as a slurry   No  facility-administered encounter medications on file as of 04/26/2016.    Allergies  Allergen Reactions  . Ativan [Lorazepam] Other (See Comments)    Agitation, aggressive actions, significant mental changes  . Fentanyl Itching  . Morphine And Related Itching and Rash   Patient Active Problem List   Diagnosis Date Noted  . Acute blood loss anemia 12/04/2014  . Obese 11/13/2014  . S/P bilateral THA, AA 11/12/2014  . Confusion 10/13/2014  . Acute confusional state 10/13/2014  . 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 Aftercare following joint replacement 03/28/2014  . History of artificial joint 03/28/2014  . Degenerative arthritis of hip 09/27/2013  . Paroxysmal atrial fibrillation ([place]) 09/07/2013  . Tachycardia 07/03/2013  . Snoring 07/03/2013  . Diverticulosis of colon without hemorrhage 06/26/2013  . Abdominal pain, right upper quadrant 01/04/2013  . Nausea alone 01/04/2013  . Hoarseness of voice 01/04/2013  . Diarrhea 01/04/2013  . Broken toe 03/09/2012  . Dyspnea 11/05/2011  . Upper airway cough syndrome 11/05/2011  . Asthma, persistent controlled 11/05/2011  . Morbid obesity with BMI of 45.0-49.9, adult ([place]) 11/05/2011  . HTN (hypertension) 09/09/2011  . Hyperlipemia 09/09/2011  . Bursitis, trochanteric 08/09/2011  . Ankle arthropathy 05/31/2011   Social History   Social History  . Marital status: Married    Spouse name: [name]  . Number of children: [name]  . Years of education: [name]   Occupational History  . retired Retired   Social History Main Topics  . Smoking status: Never Smoker  . Smokeless tobacco: Never Used  . Alcohol use No  . Drug use: No  . Sexual activity: Yes   Other Topics Concern  . Not on file   Social History Narrative  . No narrative on file    [name]'s family history includes Arthritis in her mother; Diabetes in her mother; Esophageal cancer in her mother; Esophageal cancer (age of onset: 48) in her father;  Heart disease in her maternal grandmother; Hypertension in her mother and sister; Other in her sister; Stroke in her paternal grandmother.      Objective:    Vitals:   04/26/16 0937  BP: 90/60  Pulse: 88    Physical Exam  well-developed older white female in no acute distress, pleasant blood pressure 90/60 pulse 88, height 5 foot 5 weight 183 BMI 29.9. HEENT; nontraumatic normocephalic EOMI PERRLA sclera anicteric, CV;regular rate and rhythm with S1-S2 no murmur or gallop, Pulmonary ;clear bilaterally, Abdomen ;soft, bowel sounds are present she is tender in the epigastrium and right upper quadrant, there is no rebound, she does have some guarding, no palpable mass or hepatosplenomegaly, Rectal ;exam not done, Ext;no clubbing cyanosis or edema skin warm and dry, Neuropsych; mood and affect appropriate  Assessment & Plan:   #71 71 year old female with acute epigastric and right upper quadrant pain 2 weeks. This is in patient status post sleeve gastrectomy for obesity 2016 and previous hiatal hernia repair as well as cholecystectomy. Etiology of symptoms is not entirely clear, this may be an acute gastritis/gastropathy, labs done 04/20/2016 are reassuring. We'll need to rule out other acute intra-abdominal inflammatory process, partial obstruction etc. #2 history of diverticulosis #3 asthma #4 atrial fibrillation  Plan; Schedule for CT scan of the abdomen and pelvis Continue Protonix 40 mg by mouth twice daily for 2 more weeks then decrease to 40 mg once daily  Continue Carafate 1 g by mouth 4 times daily or 2 more weeks Further plans pending results of CT. If CT is negative and symptoms are persisting will need EGD with Dr. Havery Moros. She will continue bland diet with small frequent feedings.   Amy S Esterwood PA-C 04/26/2016   Cc: [name], MD

## 2016-04-26 NOTE — Progress Notes (Signed)
Agree with the assessment and plan with the following recommendations. With her gastric bypass status it may be good to put her on omeprazole capsules 40mg  twice daily, and open them, mix with apple sauce / yogurt and then ingest, as protonix tablets likely not being absorbed too much. Will await CT, she is post-chole, if negative, will need EGD to rule out marginal ulcer.

## 2016-04-26 NOTE — Patient Instructions (Addendum)
We sent refills for  Pantoprazole Sodium 40 mg.  Aslo Refills for Carafate 4 times daily, between meals and at bedtime.    You have been scheduled for a CT scan of the abdomen and pelvis at [facility] ([address]---this is in the same building as [facility]).   You are scheduled on 04-28-2016 at 3:00 PM. You should arrive at 2:45 PM to your appointment time for registration. Please follow the written instructions below on the day of your exam:  WARNING: IF YOU ARE ALLERGIC TO IODINE/X-RAY DYE, PLEASE NOTIFY RADIOLOGY IMMEDIATELY AT [phone]! YOU WILL BE GIVEN A 13 HOUR PREMEDICATION PREP.  1) Do not eat or drink anything after 11:00 am (4 hours prior to your test) 2) You have been given 2 bottles of oral contrast to drink. The solution may taste               better if refrigerated, but do NOT add ice or any other liquid to this solution. Shake             well before drinking.    Drink 1 bottle of contrast @ 1:00 PM (2 hours prior to your exam)  Drink 1 bottle of contrast @ 2:00 PM (1 hour prior to your exam)  You may take any medications as prescribed with a small amount of water except for the following: Metformin, Glucophage, Glucovance, Avandamet, Riomet, Fortamet, Actoplus Met, Janumet, Glumetza or Metaglip. The above medications must be held the day of the exam AND 48 hours after the exam.  The purpose of you drinking the oral contrast is to aid in the visualization of your intestinal tract. The contrast solution may cause some diarrhea. Before your exam is started, you will be given a small amount of fluid to drink. Depending on your individual set of symptoms, you may also receive an intravenous injection of x-ray contrast/dye. Plan on being at [facility] for 30 minutes or long, depending on the type of exam you are having performed.  If you have any questions regarding your exam or if you need to reschedule, you may call the CT department at  [phone] between the hours of 8:00 am and 5:00 pm, Monday-Friday.  ________________________________________________________________________

## 2016-04-27 NOTE — H&P (Signed)
[name] is an 71 y.o. female.    Chief Complaint: right shoulder  HPI: Pt is a 71 y.o. female complaining of right shoulder pain for multiple years. Pain had continually increased since the beginning. X-rays in the clinic show end-stage arthritic changes of the right shoulder. Pt has tried various conservative treatments which have failed to alleviate their symptoms, including injections and therapy. Various options are discussed with the patient. Risks, benefits and expectations were discussed with the patient. Patient understand the risks, benefits and expectations and wishes to proceed with surgery.   PCP:  [name], MD  D/C Plans: Home  PMH: Past Medical History:  Diagnosis Date  . Antral polyp    benign  . Anxiety   . Arthritis   . Asthma   . Depression   . Diverticulosis   . GERD (gastroesophageal reflux disease) 09/09/2011  . History of transfusion   . HTN (hypertension) 09/09/2011  . Hyperlipemia 09/09/2011  . Hypertension   . IBS (irritable bowel syndrome)   . Internal hemorrhoid   . Obesity   . Paroxysmal atrial fibrillation ([facility])   . PONV (postoperative nausea and vomiting)    with big surgeries, none with endos etc, with colonoscopy- 5-6 years ago could not swallow   . Shortness of breath   . Sleep apnea    borderline per patient no cpap   . Tachycardia   . Unstable angina ([place]) 09/09/2011  . Vocal cord dysfunction 2013   swelling    PSH: Past Surgical History:  Procedure Laterality Date  . ABDOMINAL HYSTERECTOMY  1988  . ANKLE FUSION Right    x 2  . BACK SURGERY  [phone]   3 ruptured discs, lower back  . BILATERAL ANTERIOR TOTAL HIP ARTHROPLASTY Bilateral 11/12/2014   Procedure: BILATERAL ANTERIOR TOTAL HIP ARTHROPLASTY;  Surgeon: Paralee Cancel, MD;  Location: [facility] ORS;  Service: Orthopedics;  Laterality: Bilateral;  . BRAVO [place] STUDY [name] 03/06/2013   Procedure: BRAVO [place] STUDY;  Surgeon: Inda Castle, MD;  Location: [facility] ENDOSCOPY;   Service: Endoscopy;  Laterality: [name];  . CARDIAC CATHETERIZATION  2013   NORMAL  . CHOLECYSTECTOMY    . ELBOW SURGERY Left    removed bone chip  . ESOPHAGOGASTRODUODENOSCOPY [name] 03/06/2013   Procedure: ESOPHAGOGASTRODUODENOSCOPY (EGD);  Surgeon: Inda Castle, MD;  Location: [facility];  Service: Endoscopy;  Laterality: [name];  . FOOT OSTEOTOMY W/ PLANTAR FASCIA RELEASE Left   . hip ilioban release Right 1997  . LAPAROSCOPIC GASTRIC SLEEVE RESECTION WITH HIATAL HERNIA REPAIR  08/20/2014   Procedure: LAPAROSCOPIC GASTRIC SLEEVE RESECTION WITH HIATAL HERNIA  REPAIR AND  UPPER ENDOSCOPY;  Surgeon: Johnathan Hausen, MD;  Location: [facility];  Service: General;;  . LEFT HEART CATHETERIZATION WITH CORONARY ANGIOGRAM Bilateral 09/10/2011   Procedure: LEFT HEART CATHETERIZATION WITH CORONARY ANGIOGRAM;  Surgeon: Lorretta Harp, MD;  Location: [facility] C[facility] LAB;  Service: Cardiovascular;  Laterality: Bilateral;  . NASAL SINUS SURGERY  1997  . SHOULDER SURGERY Left   . tfc wrist Left 1997  . thumb replacement Bilateral    one 2009 and one 2014  . TOTAL ANKLE REPLACEMENT Right    x 3  . TOTAL ANKLE REPLACEMENT Left   . TOTAL ANKLE REPLACEMENT    . TOTAL HIP ARTHROPLASTY Bilateral   . TOTAL KNEE ARTHROPLASTY Bilateral    left 2002, right 2012  . TRANSTHORACIC ECHOCARDIOGRAM  09/09/2011   MILD CONCENTRIC LVH. TRACE MITRAL REGURG. TRACE TR. MILD AORTIC REGURG.  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 UPPER GI ENDOSCOPY  08/20/2014   Procedure: UPPER GI ENDOSCOPY;  Surgeon: Johnathan Hausen, MD;  Location: [facility]S;  Service: General;;  . WRIST SURGERY Left 1997, 2009     Social History:  reports that she has never smoked. She has never used smokeless tobacco. She reports that she does not drink alcohol or use drugs.  Allergies:  Allergies  Allergen Reactions  . Ativan [Lorazepam] Other (See Comments)    Agitation, aggressive actions, significant mental changes  . Fentanyl Itching  . Morphine  And Related Itching and Rash    Medications: No current facility-administered medications for this encounter.    Current Outpatient Prescriptions  Medication Sig Dispense Refill  . acetaminophen (TYLENOL) 500 MG tablet Take 500 mg by mouth every 6 (six) hours as needed.    . Biotin 10000 MCG TABS Take 1 tablet by mouth daily.    . clorazepate (TRANXENE) 7.5 MG tablet Take 7.5 mg by mouth 3 (three) times daily as needed for anxiety.    [name] diltiazem (CARTIA XT) 180 MG 24 hr capsule Take 1 capsule (180 mg total) by mouth daily. 30 capsule 11  . docusate sodium (COLACE) 100 MG capsule Take 1 capsule (100 mg total) by mouth 2 (two) times daily. 10 capsule 0  . DULoxetine (CYMBALTA) 60 MG capsule Take 1 capsule by mouth daily with supper.    . losartan (COZAAR) 50 MG tablet Take 50 mg by mouth daily.     . Multiple Vitamin (MULTIVITAMIN) tablet Take 1 tablet by mouth daily.    . ondansetron (ZOFRAN) 4 MG tablet Take 4 mg by mouth every 8 (eight) hours as needed for nausea or vomiting.    . pantoprazole (PROTONIX) 40 MG tablet Take 1 tablet (40 mg total) by mouth daily. Take 1 tablet daily every morning. 90 tablet 0  . Probiotic Product (RESTORA) CAPS Take 1 capsule by mouth daily. (Patient taking differently: Take 1 capsule by mouth daily with supper. ) 30 capsule 2  . simvastatin (ZOCOR) 20 MG tablet Take 20 mg by mouth daily.    . sucralfate (CARAFATE) 1 g tablet Take 1 tablet (1 g total) by mouth 4 (four) times daily -  with meals and at bedtime. May dissolve tablet in 1 tablespoon of water to take as a slurry 120 tablet 1  . traMADol (ULTRAM) 50 MG tablet Take by mouth every 6 (six) hours as needed.    . vitamin B-12 (CYANOCOBALAMIN) 1000 MCG tablet Take 1,000 mcg by mouth daily.      No results found for this or any previous visit (from the past 48 hour(s)). No results found.  ROS: Pain with rom of the right upper extremity  Physical Exam:  Alert and oriented 71 y.o. female in no acute  distress Cranial nerves 2-12 intact Cervical spine: full rom with no tenderness, nv intact distally Chest: active breath sounds bilaterally, no wheeze rhonchi or rales Heart: regular rate and rhythm, no murmur Abd: non tender non distended with active bowel sounds Hip is stable with rom  Right shoulder with limited rom and strength due to arthropathy nv intact distally No rashes or edema   Assessment/Plan Assessment: right shoulder rotator cuff arthropathy  Plan: Patient will undergo a right reverse total shoulder by Dr. Veverly Fells at [facility]. Risks benefits and expectations were discussed with the patient. Patient understand risks, benefits and expectations and wishes to proceed.

## 2016-04-28 ENCOUNTER — Ambulatory Visit (INDEPENDENT_AMBULATORY_CARE_PROVIDER_SITE_OTHER)
Admission: RE | Admit: 2016-04-28 | Discharge: 2016-04-28 | Disposition: A | Discharge status: Home / Self Care | Payer: Medicare Other | Source: Ambulatory Visit | Attending: Physician Assistant | Admitting: Physician Assistant

## 2016-04-28 DIAGNOSIS — R1013 Epigastric pain: Secondary | ICD-10-CM

## 2016-04-28 DIAGNOSIS — R110 Nausea: Secondary | ICD-10-CM

## 2016-04-28 DIAGNOSIS — Z9049 Acquired absence of other specified parts of digestive tract: Secondary | ICD-10-CM

## 2016-04-28 DIAGNOSIS — Z9884 Bariatric surgery status: Secondary | ICD-10-CM

## 2016-04-28 DIAGNOSIS — R1011 Right upper quadrant pain: Secondary | ICD-10-CM | POA: Diagnosis not present

## 2016-04-28 MED ORDER — IOPAMIDOL (ISOVUE-300) INJECTION 61%
100.0000 mL | Freq: Once | INTRAVENOUS | Status: AC | PRN
  Administered 2016-04-28: 100 mL via INTRAVENOUS

## 2016-04-29 ENCOUNTER — Telehealth: Payer: Self-pay | Admitting: Physician Assistant

## 2016-04-29 DIAGNOSIS — M19071 Primary osteoarthritis, right ankle and foot: Secondary | ICD-10-CM | POA: Diagnosis not present

## 2016-04-29 DIAGNOSIS — M25571 Pain in right ankle and joints of right foot: Secondary | ICD-10-CM | POA: Diagnosis not present

## 2016-04-29 DIAGNOSIS — Z96661 Presence of right artificial ankle joint: Secondary | ICD-10-CM | POA: Diagnosis not present

## 2016-04-30 NOTE — Telephone Encounter (Signed)
Please advise 

## 2016-04-30 NOTE — Telephone Encounter (Signed)
DOD reviewed. CT shows nothing concerning. Diverticulosis, no diverticulitis. No obstruction. No masses or tumors. Continue Protonix  Twice dailyand a soft diet. Patient still very upset, stating the plan was to be cutting back on Protonix. And she knew about the diet. Tried to reassure her I would contact Amy on Monday.

## 2016-04-30 NOTE — Telephone Encounter (Signed)
Pt is calling about her CT scan results

## 2016-04-30 NOTE — Telephone Encounter (Signed)
Pt is calling back about her CT scan results.  She is upset bc she said she has been waiting since Jan 23rd for the results.  She is requesting to have Amy Esterwood or Dr. Havery Moros call her back. I explained both of them are off today.

## 2016-05-03 NOTE — Telephone Encounter (Signed)
Spoke with Nicoletta Ba, PAC. She concurs with Dr Woodward Ku recommendations. Patient contacted. She reports she had a good weekend and feels there is some improvement. She is still "sore" but better. Patient is advised as follows; She will gradually advance to her normal diet. She may decrease the Protonix from  BID to once daily. She may stop the Carafate. She understands to contact us if she were to acutely worsen or fail to improve. The next step would be an EGD.

## 2016-05-10 ENCOUNTER — Encounter (HOSPITAL_COMMUNITY): Payer: Self-pay

## 2016-05-10 ENCOUNTER — Encounter (HOSPITAL_COMMUNITY)
Admission: RE | Admit: 2016-05-10 | Discharge: 2016-05-10 | Disposition: A | Discharge status: Home / Self Care | Payer: Medicare Other | Source: Ambulatory Visit | Attending: Orthopedic Surgery | Admitting: Orthopedic Surgery

## 2016-05-10 DIAGNOSIS — J45909 Unspecified asthma, uncomplicated: Secondary | ICD-10-CM | POA: Diagnosis not present

## 2016-05-10 DIAGNOSIS — I10 Essential (primary) hypertension: Secondary | ICD-10-CM | POA: Diagnosis not present

## 2016-05-10 DIAGNOSIS — Z79899 Other long term (current) drug therapy: Secondary | ICD-10-CM | POA: Insufficient documentation

## 2016-05-10 DIAGNOSIS — R1013 Epigastric pain: Secondary | ICD-10-CM | POA: Insufficient documentation

## 2016-05-10 DIAGNOSIS — I4891 Unspecified atrial fibrillation: Secondary | ICD-10-CM | POA: Insufficient documentation

## 2016-05-10 DIAGNOSIS — R1011 Right upper quadrant pain: Secondary | ICD-10-CM | POA: Insufficient documentation

## 2016-05-10 DIAGNOSIS — Z01812 Encounter for preprocedural laboratory examination: Secondary | ICD-10-CM | POA: Diagnosis not present

## 2016-05-10 DIAGNOSIS — Z01810 Encounter for preprocedural cardiovascular examination: Secondary | ICD-10-CM | POA: Insufficient documentation

## 2016-05-10 DIAGNOSIS — Z9884 Bariatric surgery status: Secondary | ICD-10-CM | POA: Insufficient documentation

## 2016-05-10 LAB — CBC
HEMATOCRIT: 38.1 % (ref 36.0–46.0)
HEMOGLOBIN: 12.4 g/dL (ref 12.0–15.0)
MCH: 31.7 pg (ref 26.0–34.0)
MCHC: 32.5 g/dL (ref 30.0–36.0)
MCV: 97.4 fL (ref 78.0–100.0)
Platelets: 202 10*3/uL (ref 150–400)
RBC: 3.91 MIL/uL (ref 3.87–5.11)
RDW: 12.8 % (ref 11.5–15.5)
WBC: 5.2 10*3/uL (ref 4.0–10.5)

## 2016-05-10 LAB — BASIC METABOLIC PANEL
Anion gap: 10 (ref 5–15)
BUN: 21 mg/dL — AB (ref 6–20)
CHLORIDE: 104 mmol/L (ref 101–111)
CO2: 27 mmol/L (ref 22–32)
CREATININE: 0.92 mg/dL (ref 0.44–1.00)
Calcium: 9.2 mg/dL (ref 8.9–10.3)
GFR calc Af Amer: >60 mL/min (ref >60)
GFR calc non Af Amer: >60 mL/min (ref >60)
Glucose, Bld: 91 mg/dL (ref 65–99)
Potassium: 4.1 mmol/L (ref 3.5–5.1)
Sodium: 141 mmol/L (ref 135–145)

## 2016-05-10 LAB — SURGICAL PCR SCREEN
MRSA, PCR: NEGATIVE
STAPHYLOCOCCUS AUREUS: NEGATIVE

## 2016-05-10 NOTE — Pre-Procedure Instructions (Signed)
    CHAROLETTE HARDISTER  05/10/2016      [facility] [id number] - [address], [place] [id number] [place] [address] Phone: [phone] Fax: [phone]  [facility] - [place], [place] [address] Phone: [phone] Fax: [phone]  [facility] [place] - [id number] [place] ([place]) [place] [address] Phone: [phone] Fax: [phone]    Your procedure is scheduled on 05/14/16.  Report to [facility] Admitting at 530 A.M.  Call this number if you have problems the morning of surgery:  [phone]   Remember:  Do not eat food or drink liquids after midnight.  Take these medicines the morning of surgery with A SIP OF WATER --all inhalers,tylenol,dilitazem,protonix,ultram   Do not wear jewelry, make-up or nail polish.  Do not wear lotions, powders, or perfumes, or deoderant.  Do not shave 48 hours prior to surgery.  Men may shave face and neck.  Do not bring valuables to the hospital.  [facility] is not responsible for any belongings or valuables.  Contacts, dentures or bridgework may not be worn into surgery.  Leave your suitcase in the car.  After surgery it may be brought to your room.  For patients admitted to the hospital, discharge time will be determined by your treatment team.  Patients discharged the day of surgery will not be allowed to drive home.   Name and phone number of your driver:   Special instructions: Do not take any aspirin,anti-inflammatories,vitamins,or herbal supplements 5-7 days prior to surgery.  Please read over the following fact sheets that you were given. MRSA Information

## 2016-05-11 ENCOUNTER — Ambulatory Visit: Payer: Self-pay | Admitting: Skilled Nursing Facility1

## 2016-05-14 ENCOUNTER — Inpatient Hospital Stay (HOSPITAL_COMMUNITY)
Admission: RE | Admit: 2016-05-14 | Discharge: 2016-05-15 | DRG: 483 | Disposition: A | Discharge status: Home / Self Care | Payer: Medicare Other | Source: Ambulatory Visit | Attending: Orthopedic Surgery | Admitting: Orthopedic Surgery

## 2016-05-14 ENCOUNTER — Inpatient Hospital Stay (HOSPITAL_COMMUNITY): Payer: Medicare Other | Admitting: Anesthesiology

## 2016-05-14 ENCOUNTER — Inpatient Hospital Stay (HOSPITAL_COMMUNITY): Payer: Medicare Other

## 2016-05-14 ENCOUNTER — Encounter (HOSPITAL_COMMUNITY)
Admission: RE | Disposition: A | Discharge status: Home / Self Care | Payer: Self-pay | Source: Ambulatory Visit | Attending: Orthopedic Surgery

## 2016-05-14 ENCOUNTER — Encounter (HOSPITAL_COMMUNITY): Payer: Self-pay | Admitting: Anesthesiology

## 2016-05-14 DIAGNOSIS — Z96698 Presence of other orthopedic joint implants: Secondary | ICD-10-CM | POA: Diagnosis present

## 2016-05-14 DIAGNOSIS — Z96662 Presence of left artificial ankle joint: Secondary | ICD-10-CM | POA: Diagnosis present

## 2016-05-14 DIAGNOSIS — Z96661 Presence of right artificial ankle joint: Secondary | ICD-10-CM | POA: Diagnosis present

## 2016-05-14 DIAGNOSIS — M75101 Unspecified rotator cuff tear or rupture of right shoulder, not specified as traumatic: Secondary | ICD-10-CM | POA: Diagnosis present

## 2016-05-14 DIAGNOSIS — Z885 Allergy status to narcotic agent status: Secondary | ICD-10-CM

## 2016-05-14 DIAGNOSIS — E785 Hyperlipidemia, unspecified: Secondary | ICD-10-CM | POA: Diagnosis present

## 2016-05-14 DIAGNOSIS — I10 Essential (primary) hypertension: Secondary | ICD-10-CM | POA: Diagnosis present

## 2016-05-14 DIAGNOSIS — K219 Gastro-esophageal reflux disease without esophagitis: Secondary | ICD-10-CM | POA: Diagnosis present

## 2016-05-14 DIAGNOSIS — E669 Obesity, unspecified: Secondary | ICD-10-CM | POA: Diagnosis present

## 2016-05-14 DIAGNOSIS — M19011 Primary osteoarthritis, right shoulder: Principal | ICD-10-CM | POA: Diagnosis present

## 2016-05-14 DIAGNOSIS — F419 Anxiety disorder, unspecified: Secondary | ICD-10-CM | POA: Diagnosis present

## 2016-05-14 DIAGNOSIS — I480 Paroxysmal atrial fibrillation: Secondary | ICD-10-CM | POA: Diagnosis present

## 2016-05-14 DIAGNOSIS — G4730 Sleep apnea, unspecified: Secondary | ICD-10-CM | POA: Diagnosis present

## 2016-05-14 DIAGNOSIS — Z96653 Presence of artificial knee joint, bilateral: Secondary | ICD-10-CM | POA: Diagnosis present

## 2016-05-14 DIAGNOSIS — Z96643 Presence of artificial hip joint, bilateral: Secondary | ICD-10-CM | POA: Diagnosis present

## 2016-05-14 DIAGNOSIS — K589 Irritable bowel syndrome without diarrhea: Secondary | ICD-10-CM | POA: Diagnosis present

## 2016-05-14 DIAGNOSIS — J45909 Unspecified asthma, uncomplicated: Secondary | ICD-10-CM | POA: Diagnosis present

## 2016-05-14 DIAGNOSIS — Z96611 Presence of right artificial shoulder joint: Secondary | ICD-10-CM

## 2016-05-14 DIAGNOSIS — R197 Diarrhea, unspecified: Secondary | ICD-10-CM | POA: Diagnosis not present

## 2016-05-14 DIAGNOSIS — S46001A Unspecified injury of muscle(s) and tendon(s) of the rotator cuff of right shoulder, initial encounter: Secondary | ICD-10-CM | POA: Diagnosis not present

## 2016-05-14 DIAGNOSIS — Z888 Allergy status to other drugs, medicaments and biological substances status: Secondary | ICD-10-CM

## 2016-05-14 DIAGNOSIS — F329 Major depressive disorder, single episode, unspecified: Secondary | ICD-10-CM | POA: Diagnosis present

## 2016-05-14 DIAGNOSIS — G8918 Other acute postprocedural pain: Secondary | ICD-10-CM | POA: Diagnosis not present

## 2016-05-14 DIAGNOSIS — Z6831 Body mass index (BMI) 31.0-31.9, adult: Secondary | ICD-10-CM | POA: Diagnosis not present

## 2016-05-14 DIAGNOSIS — Z79899 Other long term (current) drug therapy: Secondary | ICD-10-CM

## 2016-05-14 DIAGNOSIS — Z471 Aftercare following joint replacement surgery: Secondary | ICD-10-CM | POA: Diagnosis not present

## 2016-05-14 HISTORY — PX: TOTAL SHOULDER ARTHROPLASTY: SHX126

## 2016-05-14 HISTORY — PX: REVERSE SHOULDER ARTHROPLASTY: SHX5054

## 2016-05-14 SURGERY — ARTHROPLASTY, SHOULDER, TOTAL, REVERSE
Anesthesia: General | Site: Shoulder | Laterality: Right

## 2016-05-14 MED ORDER — METHOCARBAMOL 500 MG PO TABS: 500.0000 mg | ORAL_TABLET | Freq: Three times a day (TID) | ORAL | 1 refills | Status: DC | PRN

## 2016-05-14 MED ORDER — BUPIVACAINE-EPINEPHRINE 0.25% -1:200000 IJ SOLN
INTRAMUSCULAR | Status: DC | PRN
  Administered 2016-05-14: 10 mL

## 2016-05-14 MED ORDER — TRAMADOL HCL 50 MG PO TABS: 50.0000 mg | ORAL_TABLET | Freq: Four times a day (QID) | ORAL | Status: DC | PRN

## 2016-05-14 MED ORDER — METOCLOPRAMIDE HCL 5 MG/ML IJ SOLN
INTRAMUSCULAR | Status: AC
  Filled 2016-05-14: qty 2

## 2016-05-14 MED ORDER — 0.9 % SODIUM CHLORIDE (POUR BTL) OPTIME
TOPICAL | Status: DC | PRN
  Administered 2016-05-14: 1000 mL

## 2016-05-14 MED ORDER — BIOTIN 10000 MCG PO TABS: 10000.0000 ug | ORAL_TABLET | Freq: Every day | ORAL | Status: DC

## 2016-05-14 MED ORDER — CEFAZOLIN SODIUM-DEXTROSE 2-4 GM/100ML-% IV SOLN
2.0000 g | INTRAVENOUS | Status: AC
  Administered 2016-05-14: 2 g via INTRAVENOUS
  Filled 2016-05-14: qty 100

## 2016-05-14 MED ORDER — PANTOPRAZOLE SODIUM 40 MG PO TBEC
40.0000 mg | DELAYED_RELEASE_TABLET | Freq: Every day | ORAL | Status: DC
  Administered 2016-05-15: 40 mg via ORAL
  Filled 2016-05-14: qty 1

## 2016-05-14 MED ORDER — DILTIAZEM HCL ER COATED BEADS 180 MG PO CP24
180.0000 mg | ORAL_CAPSULE | Freq: Every day | ORAL | Status: DC
  Administered 2016-05-15: 180 mg via ORAL
  Filled 2016-05-14: qty 1

## 2016-05-14 MED ORDER — SODIUM CHLORIDE 0.9 % IV SOLN
INTRAVENOUS | Status: DC
  Administered 2016-05-14: 15:00:00 via INTRAVENOUS

## 2016-05-14 MED ORDER — PHENYLEPHRINE HCL 10 MG/ML IJ SOLN
INTRAVENOUS | Status: DC | PRN
  Administered 2016-05-14: 10 ug/min via INTRAVENOUS

## 2016-05-14 MED ORDER — VITAMIN D3 25 MCG (1000 UNIT) PO TABS
1000.0000 [IU] | ORAL_TABLET | Freq: Two times a day (BID) | ORAL | Status: DC
  Administered 2016-05-15: 10:00:00 via ORAL
  Filled 2016-05-14 (×3): qty 1

## 2016-05-14 MED ORDER — SCOPOLAMINE 1 MG/3DAYS TD PT72
MEDICATED_PATCH | TRANSDERMAL | Status: AC
  Administered 2016-05-14: 1.5 mg
  Filled 2016-05-14: qty 1

## 2016-05-14 MED ORDER — POLYETHYLENE GLYCOL 3350 17 G PO PACK: 17.0000 g | PACK | Freq: Every day | ORAL | Status: DC | PRN

## 2016-05-14 MED ORDER — ONDANSETRON HCL 4 MG/2ML IJ SOLN
INTRAMUSCULAR | Status: AC
  Filled 2016-05-14: qty 2

## 2016-05-14 MED ORDER — METHOCARBAMOL 1000 MG/10ML IJ SOLN: 500.0000 mg | Freq: Four times a day (QID) | INTRAMUSCULAR | Status: DC | PRN

## 2016-05-14 MED ORDER — SODIUM CHLORIDE 0.9 % IR SOLN: Status: DC | PRN

## 2016-05-14 MED ORDER — ACETAMINOPHEN 650 MG RE SUPP
650.0000 mg | Freq: Four times a day (QID) | RECTAL | Status: DC | PRN
Start: 2016-05-14 — End: 2016-05-15

## 2016-05-14 MED ORDER — DULOXETINE HCL 60 MG PO CPEP
60.0000 mg | ORAL_CAPSULE | Freq: Every day | ORAL | Status: DC
  Administered 2016-05-14: 60 mg via ORAL
  Filled 2016-05-14: qty 1

## 2016-05-14 MED ORDER — ONDANSETRON HCL 4 MG PO TABS: 4.0000 mg | ORAL_TABLET | Freq: Three times a day (TID) | ORAL | Status: DC | PRN

## 2016-05-14 MED ORDER — METHOCARBAMOL 500 MG PO TABS
500.0000 mg | ORAL_TABLET | Freq: Four times a day (QID) | ORAL | Status: DC | PRN
  Administered 2016-05-14 – 2016-05-15 (×3): 500 mg via ORAL
  Filled 2016-05-14 (×3): qty 1

## 2016-05-14 MED ORDER — OXYCODONE-ACETAMINOPHEN 5-325 MG PO TABS: 1.0000 | ORAL_TABLET | ORAL | 0 refills | Status: DC | PRN

## 2016-05-14 MED ORDER — BUPIVACAINE HCL (PF) 0.25 % IJ SOLN
INTRAMUSCULAR | Status: AC
  Filled 2016-05-14: qty 30

## 2016-05-14 MED ORDER — MENTHOL 3 MG MT LOZG
1.0000 | LOZENGE | OROMUCOSAL | Status: DC | PRN
Start: 2016-05-14 — End: 2016-05-15

## 2016-05-14 MED ORDER — SUGAMMADEX SODIUM 200 MG/2ML IV SOLN
INTRAVENOUS | Status: DC | PRN
  Administered 2016-05-14: 200 mg via INTRAVENOUS

## 2016-05-14 MED ORDER — DICLOFENAC SODIUM 1 % TD GEL: Freq: Every day | TRANSDERMAL | Status: DC | PRN

## 2016-05-14 MED ORDER — HYDROMORPHONE HCL 1 MG/ML IJ SOLN
INTRAMUSCULAR | Status: AC
  Filled 2016-05-14: qty 1

## 2016-05-14 MED ORDER — SUCRALFATE 1 G PO TABS
1.0000 g | ORAL_TABLET | Freq: Three times a day (TID) | ORAL | Status: DC
  Administered 2016-05-15: 1 g via ORAL
  Filled 2016-05-14: qty 1

## 2016-05-14 MED ORDER — HYDROMORPHONE HCL 1 MG/ML IJ SOLN: 0.5000 mg | INTRAMUSCULAR | Status: DC | PRN

## 2016-05-14 MED ORDER — HYDROMORPHONE HCL 1 MG/ML IJ SOLN: 0.2500 mg | INTRAMUSCULAR | Status: DC | PRN

## 2016-05-14 MED ORDER — METOCLOPRAMIDE HCL 5 MG PO TABS: 5.0000 mg | ORAL_TABLET | Freq: Three times a day (TID) | ORAL | Status: DC | PRN

## 2016-05-14 MED ORDER — BISACODYL 10 MG RE SUPP: 10.0000 mg | Freq: Every day | RECTAL | Status: DC | PRN

## 2016-05-14 MED ORDER — DOCUSATE SODIUM 100 MG PO CAPS
100.0000 mg | ORAL_CAPSULE | Freq: Two times a day (BID) | ORAL | Status: DC
Start: 2016-05-14 — End: 2016-05-15
  Administered 2016-05-14 – 2016-05-15 (×2): 100 mg via ORAL
  Filled 2016-05-14 (×2): qty 1

## 2016-05-14 MED ORDER — EPINEPHRINE PF 1 MG/ML IJ SOLN
INTRAMUSCULAR | Status: AC
  Filled 2016-05-14: qty 1

## 2016-05-14 MED ORDER — LACTATED RINGERS IV SOLN
INTRAVENOUS | Status: DC | PRN
  Administered 2016-05-14: 09:00:00 via INTRAVENOUS

## 2016-05-14 MED ORDER — METOCLOPRAMIDE HCL 5 MG/ML IJ SOLN: 5.0000 mg | Freq: Three times a day (TID) | INTRAMUSCULAR | Status: DC | PRN

## 2016-05-14 MED ORDER — ADULT MULTIVITAMIN W/MINERALS CH
1.0000 | ORAL_TABLET | Freq: Two times a day (BID) | ORAL | Status: DC
  Administered 2016-05-15: 1 via ORAL
  Filled 2016-05-14: qty 1

## 2016-05-14 MED ORDER — LIDOCAINE HCL (CARDIAC) 20 MG/ML IV SOLN
INTRAVENOUS | Status: DC | PRN
  Administered 2016-05-14: 60 mg via INTRAVENOUS

## 2016-05-14 MED ORDER — ROCURONIUM BROMIDE 100 MG/10ML IV SOLN
INTRAVENOUS | Status: DC | PRN
  Administered 2016-05-14: 25 mg via INTRAVENOUS

## 2016-05-14 MED ORDER — FENTANYL CITRATE (PF) 100 MCG/2ML IJ SOLN
INTRAMUSCULAR | Status: AC
  Filled 2016-05-14: qty 2

## 2016-05-14 MED ORDER — METOCLOPRAMIDE HCL 5 MG/ML IJ SOLN: 10.0000 mg | Freq: Once | INTRAMUSCULAR | Status: DC | PRN

## 2016-05-14 MED ORDER — OXYCODONE HCL 5 MG PO TABS
5.0000 mg | ORAL_TABLET | ORAL | Status: DC | PRN
  Administered 2016-05-14 – 2016-05-15 (×3): 10 mg via ORAL
  Filled 2016-05-14 (×3): qty 2

## 2016-05-14 MED ORDER — ALBUTEROL SULFATE (2.5 MG/3ML) 0.083% IN NEBU: 2.5000 mg | INHALATION_SOLUTION | Freq: Four times a day (QID) | RESPIRATORY_TRACT | Status: DC | PRN

## 2016-05-14 MED ORDER — ONDANSETRON HCL 4 MG/2ML IJ SOLN
INTRAMUSCULAR | Status: DC | PRN
  Administered 2016-05-14: 4 mg via INTRAVENOUS

## 2016-05-14 MED ORDER — CLORAZEPATE DIPOTASSIUM 3.75 MG PO TABS: 7.5000 mg | ORAL_TABLET | Freq: Three times a day (TID) | ORAL | Status: DC | PRN

## 2016-05-14 MED ORDER — METOCLOPRAMIDE HCL 5 MG/ML IJ SOLN
INTRAMUSCULAR | Status: DC | PRN
  Administered 2016-05-14: 10 mg via INTRAVENOUS

## 2016-05-14 MED ORDER — VITAMIN B-12 1000 MCG PO TABS
1000.0000 ug | ORAL_TABLET | Freq: Every day | ORAL | Status: DC
  Administered 2016-05-15: 1000 ug via ORAL
  Filled 2016-05-14: qty 1

## 2016-05-14 MED ORDER — LIDOCAINE 2% (20 MG/ML) 5 ML SYRINGE
INTRAMUSCULAR | Status: AC
  Filled 2016-05-14: qty 5

## 2016-05-14 MED ORDER — FENTANYL CITRATE (PF) 100 MCG/2ML IJ SOLN
INTRAMUSCULAR | Status: AC
  Administered 2016-05-14: 50 ug
  Filled 2016-05-14: qty 2

## 2016-05-14 MED ORDER — CEFAZOLIN SODIUM-DEXTROSE 2-4 GM/100ML-% IV SOLN
2.0000 g | Freq: Three times a day (TID) | INTRAVENOUS | Status: AC
  Administered 2016-05-14 – 2016-05-15 (×3): 2 g via INTRAVENOUS
  Filled 2016-05-14 (×4): qty 100

## 2016-05-14 MED ORDER — ACETAMINOPHEN 500 MG PO TABS: 1000.0000 mg | ORAL_TABLET | Freq: Four times a day (QID) | ORAL | Status: DC | PRN

## 2016-05-14 MED ORDER — LOSARTAN POTASSIUM 25 MG PO TABS
25.0000 mg | ORAL_TABLET | Freq: Two times a day (BID) | ORAL | Status: DC
  Administered 2016-05-14 – 2016-05-15 (×2): 25 mg via ORAL
  Filled 2016-05-14 (×2): qty 1

## 2016-05-14 MED ORDER — BUPIVACAINE-EPINEPHRINE (PF) 0.5% -1:200000 IJ SOLN
INTRAMUSCULAR | Status: DC | PRN
  Administered 2016-05-14: 30 mL via PERINEURAL

## 2016-05-14 MED ORDER — PROPOFOL 10 MG/ML IV BOLUS
INTRAVENOUS | Status: AC
  Filled 2016-05-14: qty 20

## 2016-05-14 MED ORDER — ACETAMINOPHEN 325 MG PO TABS: 650.0000 mg | ORAL_TABLET | Freq: Four times a day (QID) | ORAL | Status: DC | PRN

## 2016-05-14 MED ORDER — DEXAMETHASONE SODIUM PHOSPHATE 10 MG/ML IJ SOLN
INTRAMUSCULAR | Status: AC
  Filled 2016-05-14: qty 1

## 2016-05-14 MED ORDER — ROCURONIUM BROMIDE 50 MG/5ML IV SOSY
PREFILLED_SYRINGE | INTRAVENOUS | Status: AC
  Filled 2016-05-14: qty 5

## 2016-05-14 MED ORDER — FENTANYL CITRATE (PF) 100 MCG/2ML IJ SOLN
INTRAMUSCULAR | Status: DC | PRN
Start: 2016-05-14 — End: 2016-05-14
  Administered 2016-05-14: 50 ug via INTRAVENOUS

## 2016-05-14 MED ORDER — PROPOFOL 10 MG/ML IV BOLUS
INTRAVENOUS | Status: DC | PRN
  Administered 2016-05-14: 120 mg via INTRAVENOUS

## 2016-05-14 MED ORDER — PHENOL 1.4 % MT LIQD: 1.0000 | OROMUCOSAL | Status: DC | PRN

## 2016-05-14 MED ORDER — SIMVASTATIN 20 MG PO TABS
20.0000 mg | ORAL_TABLET | Freq: Every day | ORAL | Status: DC
  Administered 2016-05-14: 20 mg via ORAL
  Filled 2016-05-14: qty 1

## 2016-05-14 MED ORDER — ONDANSETRON HCL 4 MG PO TABS: 4.0000 mg | ORAL_TABLET | Freq: Four times a day (QID) | ORAL | Status: DC | PRN

## 2016-05-14 MED ORDER — DEXAMETHASONE SODIUM PHOSPHATE 10 MG/ML IJ SOLN
INTRAMUSCULAR | Status: DC | PRN
  Administered 2016-05-14: 10 mg via INTRAVENOUS

## 2016-05-14 MED ORDER — MIDAZOLAM HCL 2 MG/2ML IJ SOLN
INTRAMUSCULAR | Status: AC
  Administered 2016-05-14: 2 mg
  Filled 2016-05-14: qty 2

## 2016-05-14 MED ORDER — ONDANSETRON HCL 4 MG/2ML IJ SOLN: 4.0000 mg | Freq: Four times a day (QID) | INTRAMUSCULAR | Status: DC | PRN

## 2016-05-14 MED ORDER — RISAQUAD PO CAPS
1.0000 | ORAL_CAPSULE | Freq: Every day | ORAL | Status: DC
  Administered 2016-05-15: 1 via ORAL
  Filled 2016-05-14: qty 1

## 2016-05-14 MED ORDER — CHLORHEXIDINE GLUCONATE 4 % EX LIQD: 60.0000 mL | Freq: Once | CUTANEOUS | Status: DC

## 2016-05-14 SURGICAL SUPPLY — 67 items
BIT DRILL 5/64X5 DISP (BIT) ×3 IMPLANT
BLADE SAG 18X100X1.27 (BLADE) ×3 IMPLANT
CAPT SHLDR REVTOTAL 1 ×2 IMPLANT
CLOSURE WOUND 1/2 X4 (GAUZE/BANDAGES/DRESSINGS) ×1
COVER SURGICAL LIGHT HANDLE (MISCELLANEOUS) ×3 IMPLANT
DRAPE IMP U-DRAPE 54X76 (DRAPES) ×6 IMPLANT
DRAPE INCISE IOBAN 66X45 STRL (DRAPES) ×3 IMPLANT
DRAPE ORTHO SPLIT 77X108 STRL (DRAPES) ×6
DRAPE SURG ORHT 6 SPLT 77X108 (DRAPES) ×2 IMPLANT
DRAPE U-SHAPE 47X51 STRL (DRAPES) ×3 IMPLANT
DRAPE X-RAY CASS 24X20 (DRAPES) IMPLANT
DRSG ADAPTIC 3X8 NADH LF (GAUZE/BANDAGES/DRESSINGS) ×3 IMPLANT
DRSG PAD ABDOMINAL 8X10 ST (GAUZE/BANDAGES/DRESSINGS) ×3 IMPLANT
DURAPREP 26ML APPLICATOR (WOUND CARE) ×3 IMPLANT
ELECT BLADE 4.0 EZ CLEAN MEGAD (MISCELLANEOUS) ×3
ELECT NDL TIP 2.8 STRL (NEEDLE) ×1 IMPLANT
ELECT NEEDLE TIP 2.8 STRL (NEEDLE) ×3 IMPLANT
ELECT REM PT RETURN 9FT ADLT (ELECTROSURGICAL) ×3
ELECTRODE BLDE 4.0 EZ CLN MEGD (MISCELLANEOUS) ×1 IMPLANT
ELECTRODE REM PT RTRN 9FT ADLT (ELECTROSURGICAL) ×1 IMPLANT
GAUZE SPONGE 4X4 12PLY STRL (GAUZE/BANDAGES/DRESSINGS) ×3 IMPLANT
GLOVE BIOGEL PI ORTHO PRO 7.5 (GLOVE) ×2
GLOVE BIOGEL PI ORTHO PRO SZ8 (GLOVE) ×2
GLOVE ORTHO TXT STRL SZ7.5 (GLOVE) ×3 IMPLANT
GLOVE PI ORTHO PRO STRL 7.5 (GLOVE) ×1 IMPLANT
GLOVE PI ORTHO PRO STRL SZ8 (GLOVE) ×1 IMPLANT
GLOVE SURG ORTHO 8.5 STRL (GLOVE) ×3 IMPLANT
GOWN STRL REUS W/ TWL LRG LVL3 (GOWN DISPOSABLE) ×1 IMPLANT
GOWN STRL REUS W/ TWL XL LVL3 (GOWN DISPOSABLE) ×2 IMPLANT
GOWN STRL REUS W/TWL LRG LVL3 (GOWN DISPOSABLE) ×3
GOWN STRL REUS W/TWL XL LVL3 (GOWN DISPOSABLE) ×6
HANDPIECE INTERPULSE COAX TIP (DISPOSABLE)
KIT BASIN OR (CUSTOM PROCEDURE TRAY) ×3 IMPLANT
KIT ROOM TURNOVER OR (KITS) ×3 IMPLANT
MANIFOLD NEPTUNE II (INSTRUMENTS) ×3 IMPLANT
NDL 1/2 CIR MAYO (NEEDLE) ×1 IMPLANT
NDL HYPO 25GX1X1/2 BEV (NEEDLE) ×1 IMPLANT
NEEDLE 1/2 CIR MAYO (NEEDLE) ×3 IMPLANT
NEEDLE HYPO 25GX1X1/2 BEV (NEEDLE) ×3 IMPLANT
NS IRRIG 1000ML POUR BTL (IV SOLUTION) ×3 IMPLANT
PACK SHOULDER (CUSTOM PROCEDURE TRAY) ×3 IMPLANT
PAD ARMBOARD 7.5X6 YLW CONV (MISCELLANEOUS) ×6 IMPLANT
SET HNDPC FAN SPRY TIP SCT (DISPOSABLE) IMPLANT
SLING ARM FOAM STRAP LRG (SOFTGOODS) ×2 IMPLANT
SLING ARM LRG ADULT FOAM STRAP (SOFTGOODS) IMPLANT
SLING ARM MED ADULT FOAM STRAP (SOFTGOODS) IMPLANT
SPONGE GAUZE 4X4 12PLY STER LF (GAUZE/BANDAGES/DRESSINGS) ×2 IMPLANT
SPONGE LAP 18X18 X RAY DECT (DISPOSABLE) IMPLANT
SPONGE LAP 4X18 X RAY DECT (DISPOSABLE) ×3 IMPLANT
STRIP CLOSURE SKIN 1/2X4 (GAUZE/BANDAGES/DRESSINGS) ×2 IMPLANT
SUCTION FRAZIER HANDLE 10FR (MISCELLANEOUS) ×2
SUCTION TUBE FRAZIER 10FR DISP (MISCELLANEOUS) ×1 IMPLANT
SUT FIBERWIRE #2 38 T-5 BLUE (SUTURE) ×15
SUT MNCRL AB 4-0 PS2 18 (SUTURE) ×3 IMPLANT
SUT VIC AB 0 CT2 27 (SUTURE) ×3 IMPLANT
SUT VIC AB 2-0 CT1 27 (SUTURE) ×3
SUT VIC AB 2-0 CT1 TAPERPNT 27 (SUTURE) ×1 IMPLANT
SUT VICRYL 0 CT 1 36IN (SUTURE) ×3 IMPLANT
SUTURE FIBERWR #2 38 T-5 BLUE (SUTURE) ×2 IMPLANT
SYR CONTROL 10ML LL (SYRINGE) ×3 IMPLANT
TAPE CLOTH SURG 6X10 WHT LF (GAUZE/BANDAGES/DRESSINGS) ×2 IMPLANT
TOWEL OR 17X24 6PK STRL BLUE (TOWEL DISPOSABLE) ×3 IMPLANT
TOWEL OR 17X26 10 PK STRL BLUE (TOWEL DISPOSABLE) ×3 IMPLANT
TOWER CARTRIDGE SMART MIX (DISPOSABLE) IMPLANT
TRAY FOLEY W/METER SILVER 16FR (SET/KITS/TRAYS/PACK) IMPLANT
WATER STERILE IRR 1000ML POUR (IV SOLUTION) ×3 IMPLANT
YANKAUER SUCT BULB TIP NO VENT (SUCTIONS) ×3 IMPLANT

## 2016-05-14 NOTE — Interval H&P Note (Signed)
History and Physical Interval Note:  05/14/2016 9:50 AM  [name]  has presented today for surgery, with the diagnosis of Right shoulder osteoarthritis/rotator cuff tear  The various methods of treatment have been discussed with the patient and family. After consideration of risks, benefits and other options for treatment, the patient has consented to  Procedure(s): RIGHT REVERSE SHOULDER ARTHROPLASTY (Right) as a surgical intervention .  The patient's history has been reviewed, patient examined, no change in status, stable for surgery.  I have reviewed the patient's chart and labs.  Questions were answered to the patient's satisfaction.     Eren Puebla,STEVEN R

## 2016-05-14 NOTE — Transfer of Care (Signed)
Immediate Anesthesia Transfer of Care Note  Patient: [name]  Procedure(s) Performed: Procedure(s): RIGHT REVERSE SHOULDER ARTHROPLASTY (Right)  Patient Location: PACU  Anesthesia Type:GA combined with regional for post-op pain  Level of Consciousness: awake, alert  and oriented  Airway & Oxygen Therapy: Patient Spontanous Breathing and Patient connected to nasal cannula oxygen  Post-op Assessment: Report given to RN, Post -op Vital signs reviewed and stable and Patient moving all extremities  Post vital signs: Reviewed and stable  Last Vitals:  Vitals:   05/14/16 0825 05/14/16 1213  BP: 133/72   Pulse: 92   Resp: 18   Temp: 36.7 C (P) 36.4 C    Last Pain:  Vitals:   05/14/16 0825  TempSrc: Oral         Complications: No apparent anesthesia complications

## 2016-05-14 NOTE — Brief Op Note (Signed)
05/14/2016  12:25 PM  PATIENT:  [name]  71 y.o. female  PRE-OPERATIVE DIAGNOSIS:  Right shoulder osteoarthritis/rotator cuff tear  POST-OPERATIVE DIAGNOSIS:  Right shoulder osteoarthritis/rotator cuff tear  PROCEDURE:  Procedure(s): RIGHT REVERSE SHOULDER ARTHROPLASTY (Right) DePuy Delta Xtend  SURGEON:  Surgeon(s) and Role:    * Netta Cedars, MD - Primary  PHYSICIAN ASSISTANT:   ASSISTANTS: Ventura Bruns, PA-C   ANESTHESIA:   regional and general  EBL:  Total I/O In: 800 [I.V.:800] Out: 150 [Blood:150]  BLOOD ADMINISTERED:none  DRAINS: none   LOCAL MEDICATIONS USED:  MARCAINE     SPECIMEN:  No Specimen  DISPOSITION OF SPECIMEN:  [name]  COUNTS:  YES  TOURNIQUET:  * No tourniquets in log *  DICTATION: .Other Dictation: Dictation Number [id number]  PLAN OF CARE: Admit to inpatient   PATIENT DISPOSITION:  PACU - hemodynamically stable.   Delay start of Pharmacological VTE agent (>24hrs) due to surgical blood loss or risk of bleeding: no

## 2016-05-14 NOTE — Anesthesia Procedure Notes (Addendum)
Anesthesia Regional Block: Supraclavicular block   Pre-Anesthetic Checklist: ,, timeout performed, Correct Patient, Correct Site, Correct Laterality, Correct Procedure, Correct Position, site marked, Risks and benefits discussed,  Surgical consent,  Pre-op evaluation,  At surgeon's request and post-op pain management  Laterality: Right  Prep: chloraprep       Needles:  Injection technique: Single-shot  Needle Type: Echogenic Stimulator Needle     Needle Length: 9cm  Needle Gauge: 21   Needle insertion depth: 4 cm   Additional Needles:   Procedures: ultrasound guided,,,,,,,,  Narrative:  Start time: 05/14/2016 9:10 AM End time: 05/14/2016 9:16 AM Injection made incrementally with aspirations every 5 mL.  Performed by: Personally  Anesthesiologist: Josephine Igo  Additional Notes: Timeout performed. Patient sedated. Relevant anatomy ID'd using [place]. Incremental 3-12ml injection with frequent aspiration. Patient tolerated procedure well.

## 2016-05-14 NOTE — Addendum Note (Signed)
Addendum  created 05/14/16 1521 by Josephine Igo, MD   SmartForm saved

## 2016-05-14 NOTE — Anesthesia Preprocedure Evaluation (Signed)
Anesthesia Evaluation  Patient identified by MRN, date of birth, ID band Patient awake    Reviewed: Allergy & Precautions, NPO status , Patient's Chart, lab work & pertinent test results  History of Anesthesia Complications (+) PONV and history of anesthetic complications  Airway Mallampati: II  TM Distance: >3 FB Neck ROM: Full    Dental  (+) Teeth Intact, Caps, Implants   Pulmonary shortness of breath and with exertion, asthma , sleep apnea ,  Not on CPAP, Hx/o borderline OSA   Pulmonary exam normal breath sounds clear to auscultation       Cardiovascular hypertension, Pt. on medications + angina Normal cardiovascular exam+ dysrhythmias Atrial Fibrillation  Rhythm:Regular Rate:Normal  No angina since weight loss Hx/o PAF   Neuro/Psych PSYCHIATRIC DISORDERS Anxiety Depression negative neurological ROS     GI/Hepatic Neg liver ROS, GERD  ,S/P Lap gastric sleeve IBS   Endo/Other  Obesity Hyperlipidemia  Renal/GU negative Renal ROS  negative genitourinary   Musculoskeletal  (+) Arthritis , Osteoarthritis,  OA right shoulder, possible right rotator cuff tear   Abdominal (+) + obese,   Peds  Hematology  (+) anemia ,   Anesthesia Other Findings   Reproductive/Obstetrics                             Anesthesia Physical Anesthesia Plan  ASA: III  Anesthesia Plan: General and Regional   Post-op Pain Management:  Regional for Post-op pain   Induction: Intravenous  Airway Management Planned: Oral ETT  Additional Equipment:   Intra-op Plan:   Post-operative Plan: Extubation in OR  Informed Consent: I have reviewed the patients History and Physical, chart, labs and discussed the procedure including the risks, benefits and alternatives for the proposed anesthesia with the patient or authorized representative who has indicated his/her understanding and acceptance.   Dental advisory  given  Plan Discussed with: CRNA, Anesthesiologist and Surgeon  Anesthesia Plan Comments:         Anesthesia Quick Evaluation

## 2016-05-14 NOTE — Anesthesia Postprocedure Evaluation (Signed)
Anesthesia Post Note  Patient: [name]  Procedure(s) Performed: Procedure(s) (LRB): RIGHT REVERSE SHOULDER ARTHROPLASTY (Right)  Patient location during evaluation: PACU Anesthesia Type: General Level of consciousness: awake and alert and oriented Pain management: pain level controlled Vital Signs Assessment: post-procedure vital signs reviewed and stable Respiratory status: spontaneous breathing, nonlabored ventilation and respiratory function stable Cardiovascular status: blood pressure returned to baseline and stable Postop Assessment: no signs of nausea or vomiting Anesthetic complications: no Comments: No complaints of pain.       Last Vitals:  Vitals:   05/14/16 1319 05/14/16 1410  BP:  (!) 120/57  Pulse: 81 80  Resp: 16 18  Temp: 36.5 C 36.7 C    Last Pain:  Vitals:   05/14/16 0825  TempSrc: Oral                 Allyanna Appleman A.

## 2016-05-14 NOTE — Discharge Instructions (Signed)
Please keep the shoulder incision clean and dry and covered for one week, the ok to get it wet in the shower.  Use the sling as you need to.  May remove as you feel comfortable  Only use the arm/shoulder for light daily activity in front of you.  Do NOT push up out of a chair with the right arm.  Follow up with Dr Veverly Fells in two weeks in the office, call [phone]

## 2016-05-14 NOTE — Anesthesia Procedure Notes (Signed)
Procedure Name: Intubation Date/Time: 05/14/2016 10:11 AM Performed by: Kyung Rudd Pre-anesthesia Checklist: Patient identified, Emergency Drugs available, Suction available and Patient being monitored Patient Re-evaluated:Patient Re-evaluated prior to inductionOxygen Delivery Method: Circle system utilized Preoxygenation: Pre-oxygenation with 100% oxygen Intubation Type: IV induction Ventilation: Mask ventilation without difficulty Laryngoscope Size: Mac and 3 Grade View: Grade I Tube type: Oral Tube size: 7.0 mm Number of attempts: 1 Airway Equipment and Method: Stylet Placement Confirmation: ETT inserted through vocal cords under direct vision,  positive ETCO2 and breath sounds checked- equal and bilateral Secured at: 21 cm Tube secured with: Tape Dental Injury: Teeth and Oropharynx as per pre-operative assessment

## 2016-05-14 NOTE — Progress Notes (Signed)
PHARMACIST - PHYSICIAN ORDER COMMUNICATION  CONCERNING: P&T Medication Policy on Herbal Medications  DESCRIPTION:  This patient's order for:  Biotin  has been noted.  This product(s) is classified as an "herbal" or natural product. Due to a lack of definitive safety studies or FDA approval, nonstandard manufacturing practices, plus the potential risk of unknown drug-drug interactions while on inpatient medications, the Pharmacy and Therapeutics Committee does not permit the use of "herbal" or natural products of this type within [facility].   ACTION TAKEN: The pharmacy department is unable to verify this order at this time and your patient has been informed of this safety policy. Please reevaluate patient's clinical condition at discharge and address if the herbal or natural product(s) should be resumed at that time.  Reatha Harps, Pharm.D., BCPS Clinical Pharmacist

## 2016-05-15 LAB — BASIC METABOLIC PANEL
ANION GAP: 6 (ref 5–15)
BUN: 18 mg/dL (ref 6–20)
CALCIUM: 8.6 mg/dL — AB (ref 8.9–10.3)
CHLORIDE: 107 mmol/L (ref 101–111)
CO2: 26 mmol/L (ref 22–32)
Creatinine, Ser: 0.76 mg/dL (ref 0.44–1.00)
GFR calc non Af Amer: >60 mL/min (ref >60)
GLUCOSE: 143 mg/dL — AB (ref 65–99)
POTASSIUM: 4.3 mmol/L (ref 3.5–5.1)
Sodium: 139 mmol/L (ref 135–145)

## 2016-05-15 LAB — HEMOGLOBIN AND HEMATOCRIT, BLOOD
HCT: 33.2 % — ABNORMAL LOW (ref 36.0–46.0)
HEMOGLOBIN: 10.7 g/dL — AB (ref 12.0–15.0)

## 2016-05-15 NOTE — Evaluation (Signed)
Occupational Therapy Evaluation/Discharge Patient Details Name: AMBYR CATCHINGS MRN: [id number] DOB: 1945-04-18 Today's Date: 05/15/2016    History of Present Illness Pt is a 71 y.o. female s/p R reserve total shoulder arthroplasty. Pt has a PMH significant for: anxiety, arthritis, asthma, depression, diverticulosis, GERD, HTN, hyperlipidemia, hypertension, IBS, obesity, paroxysmal atrial fibrillation, and tachycardia. Surgical history significant for B anterior total hip arthroplasty, elbow surgery, laparoscopic gastric sleeve resection with hiatal hernia repair and upper endoscopy, R TKA, and L TKA.   Clinical Impression   PTA, pt was independent with ADL and functional mobility. Pt currently requires max assist for UB dressing and mod assist for LB dressing. Her husband demonstrates the ability to provide all necessary assistance. Pt presents with decreased R elbow AROM at this time likely due to her nerve block wearing off at this time. Educated pt and husband on conservative shoulder protocol including sling wear schedule, bathing/dressing techniques, and precautions as detailed below. Additionally educated on elbow/wrist/hand HEP and use of ice for pain management. They demonstrate understanding and report no further questions. No further acute OT needs and recommend progression of shoulder rehabilitation per MD at follow-up. OT will sign off.    Follow Up Recommendations  Other (comment);Supervision/Assistance - 24 hour (Progress shoulder rehab per MD.)    Equipment Recommendations  None recommended by OT    Recommendations for Other Services       Precautions / Restrictions Precautions Precautions: Shoulder Type of Shoulder Precautions: Conservative protocol: elbow/wrist/hand AROM, NWB R UE, no AROM/PROM R shoulder, OK for gentle ADL. Shoulder Interventions: Shoulder sling/immobilizer Precaution Booklet Issued: Yes (comment) Precaution Comments: Reviewed precautions and handout  with pt and husband. Required Braces or Orthoses: Sling Restrictions Weight Bearing Restrictions: Yes RUE Weight Bearing: Non weight bearing      Mobility Bed Mobility               General bed mobility comments: Sitting at EOB on OT arrival. Able to verbalize proper technique.  Transfers Overall transfer level: Needs assistance Equipment used: None Transfers: Sit to/from Stand Sit to Stand: Supervision         General transfer comment: Supervision for safety.    Balance Overall balance assessment: Needs assistance Sitting-balance support: No upper extremity supported;Feet supported Sitting balance-Leahy Scale: Good     Standing balance support: Single extremity supported;No upper extremity supported;During functional activity Standing balance-Leahy Scale: Good                              ADL Overall ADL's : Needs assistance/impaired Eating/Feeding: Set up;Sitting   Grooming: Supervision/safety;Standing;Set up;Oral care Grooming Details (indicate cue type and reason): Set-up for opening containers.  Upper Body Bathing: Minimal assistance;Sitting   Lower Body Bathing: Minimal assistance;Sit to/from stand   Upper Body Dressing : Sitting;Maximal assistance;With caregiver independent assisting Upper Body Dressing Details (indicate cue type and reason): Max assist to don/doff sling. Husband able to assist. Lower Body Dressing: Sit to/from stand;Moderate assistance   Toilet Transfer: Ambulation;Regular Toilet;Minimal assistance;Supervision/safety Toilet Transfer Details (indicate cue type and reason): Able to ambulate with supervision. Pt more comfortable with single UE support from husband. Toileting- Clothing Manipulation and Hygiene: Minimal assistance;Sit to/from stand   Tub/ Shower Transfer: Min guard;Ambulation   Functional mobility during ADLs: Supervision/safety;Minimal assistance (Caregiver independent in assisting.) General ADL Comments: Pt  able to ambulate safely with supervision but prefers single UE support from husband.     Vision Patient Visual Report:  No change from baseline Vision Assessment?: No apparent visual deficits     Perception     Praxis      Pertinent Vitals/Pain Pain Assessment: Faces Faces Pain Scale: Hurts little more Pain Location: R shoulder Pain Descriptors / Indicators: Operative site guarding;Sore Pain Intervention(s): Monitored during session;Repositioned     Hand Dominance Left   Extremity/Trunk Assessment Upper Extremity Assessment Upper Extremity Assessment: RUE deficits/detail RUE Deficits / Details: Wrist and digit movement in tact with tingling sensations. No AROM R elbow due to nerve block not wearing off fully at this time. PROM R elbow in tact.  RUE: Unable to fully assess due to immobilization   Lower Extremity Assessment Lower Extremity Assessment: Overall WFL for tasks assessed       Communication Communication Communication: No difficulties   Cognition Arousal/Alertness: Awake/alert Behavior During Therapy: WFL for tasks assessed/performed Overall Cognitive Status: Within Functional Limits for tasks assessed                     General Comments       Exercises Exercises: Shoulder     Shoulder Instructions Shoulder Instructions Donning/doffing shirt without moving shoulder: Maximal assistance Method for sponge bathing under operated UE: Minimal assistance Donning/doffing sling/immobilizer: Maximal assistance;Caregiver independent with task Correct positioning of sling/immobilizer: Minimal assistance;Caregiver independent with task ROM for elbow, wrist and digits of operated UE: Supervision/safety Sling wearing schedule (on at all times/off for ADL's): Supervision/safety Proper positioning of operated UE when showering: Supervision/safety Positioning of UE while sleeping: Supervision/safety    Home Living Family/patient expects to be discharged to::  Private residence Living Arrangements: Spouse/significant other Available Help at Discharge: Family;Available 24 hours/day Type of Home: House             Bathroom Shower/Tub: Walk-in Psychologist, prison and probation services: Standard     Home Equipment: Environmental consultant - 2 wheels;Cane - single point;Shower seat          Prior Functioning/Environment Level of Independence: Independent                 OT Problem List: Decreased strength;Decreased range of motion;Decreased activity tolerance;Impaired balance (sitting and/or standing);Decreased safety awareness;Decreased knowledge of precautions;Pain;Impaired UE functional use      OT Treatment/Interventions:      OT Goals(Current goals can be found in the care plan section) Acute Rehab OT Goals Patient Stated Goal: to go home OT Goal Formulation: With patient/family Time For Goal Achievement: 05/22/16 Potential to Achieve Goals: Good  OT Frequency:     Barriers to D/C:            Co-evaluation              End of Session Equipment Utilized During Treatment:  (R shoulder sling) Nurse Communication: Mobility status;Other (comment) (OT complete)  Activity Tolerance: Patient tolerated treatment well Patient left: with family/visitor present;with call bell/phone within reach (Sitting at EOB)  OT Visit Diagnosis: Pain Pain - Right/Left: Right Pain - part of body: Shoulder                ADL either performed or assessed with clinical judgement  Time: [id number] OT Time Calculation (min): 46 min Charges:  OT General Charges $OT Visit: 1 Procedure OT Evaluation $OT Eval Moderate Complexity: 1 Procedure OT Treatments $Self Care/Home Management : 23-37 mins G-Codes:     Norman Herrlich, MS OTR/L  Pager: [place] A Remedy Corporan 05/15/2016, 10:52 AM

## 2016-05-15 NOTE — Op Note (Signed)
NAMEMOREEN, CRANMER             ACCOUNT NO.:  [id number]  MEDICAL RECORD NO.:  [id number]  LOCATION:                                 FACILITY:  PHYSICIAN:  [name], M.D. DATE OF BIRTH:  1946-02-12  DATE OF PROCEDURE:  05/14/2016 DATE OF DISCHARGE:  05/15/2016                              OPERATIVE REPORT   POSTOPERATIVE DIAGNOSIS:  Right shoulder end-stage arthritis and rotator cuff tear.  POSTOPERATIVE DIAGNOSIS:  Right shoulder end-stage arthritis and rotator cuff tear.  PROCEDURE PERFORMED:  Right shoulder reverse total shoulder arthroplasty using DePuy Delta Xtend prosthesis.  ATTENDING SURGEON:  Theresa Heater. Veverly Fells, MD.  ASSISTANT:  Abbott Pao. Dixon, PA-C, who has scrubbed the entire procedure and necessary for satisfactory completion of surgery.  ANESTHESIA:  General anesthesia was used plus interscalene block.  ESTIMATED BLOOD LOSS:  150 mL.  FLUID REPLACED:  1200 mL of crystalloid.  INSTRUMENT COUNTS:  Correct.  COMPLICATIONS:  There were no complications.  ANTIBIOTICS:  Perioperative antibiotics were given.  INDICATIONS:  The patient is a 71 year old female with a history of worsening right shoulder pain secondary to end-stage arthritis as well as significant rotator cuff tears and significant rotator cuff dysfunction.  Given the failure of conservative management to the point where she is having constant pain, night pain that interferes with all ADLs.  The patient presents for operative reverse total shoulder arthroplasty to restore fixed fulcrum mechanics to eliminate pain and restore function of the shoulder.  Informed consent obtained.  DESCRIPTION OF PROCEDURE:  After an adequate level of anesthesia achieved, the patient was positioned in a modified beach-chair position. Right shoulder correctly identified, sterilely prepped and draped in usual manner.  Time-out was called.  After verification of a time-out, we initiated surgery using a  standard deltopectoral incision started at the coracoid process extending down to the anterior humerus.  Dissection down through subcutaneous tissues using Bovie, identified the cephalic vein, took it laterally with the deltoid, pectoralis taken medially. Conjoint tendon identified and retracted medially, identified the biceps tendon within the sheath.  We tenodesed in situ with 0 Vicryl figure-of- eight suture x2.  We next released the subscapularis subperiosteally off the lesser tuberosity and tagged it for repair, was in good shape and felt like we could repair that at the end of the surgery.  We then removed the damaged supraspinatus and infraspinatus tendons and muscle tissue, so we would not impede the reverse shoulder.  We noted end-stage arthritis and the glenohumeral joint.  We then went ahead and extended the shoulder, delivered the humerus out of the wound.  We entered the proximal humerus with a 6 mm reamer.  We then reamed up to a size 10, placed our 10 mm intramedullary resection guide for the head resection, which we did at 10 degrees of retroversion.  After resecting the appropriate height, we then went ahead and removed the excess osteophytes off the medial humerus.  We had also released the capsule off the inferior medial humerus.  We were careful to protect the axillary nerve the entire procedure.  At this point, we went ahead and completed our metaphyseal preparation with selecting the eccentric 1 right metaphyseal reaming  guide.  Once we placed that in the humerus, we then went ahead and reamed for the epi-1 right metaphysis.  We then introduced the trial component into place.  Ten stem epi 1 right, set on 0 setting, placed in 10 degrees of retroversion, impacted that in position, reduced the shoulder and then retracted it posteriorly with our retractors.  We did a 360-degree capsule labral resection.  There was a large osteophyte posteriorly, which we removed with  good 360- degree visualization of a very small glenoid.  We scraped off the remaining cartilage, placed our central guide pin, reamed for the metaglene and drilled our central peg hole and impacted the metaglene in position due to the small size of glenoid.  We could only get our inferior and superior screws, 36 inferiorly with great purchase, 30 at the base of coracoid with great purchase.  We had excellent conformity of the metaglene to our base plate to the glenoid and good stability with our 2 screws.  We then introduced our 38 standard glenosphere into position, screwed that home and impacted, screwed again.  Once we had stable check to make sure axillary nerve was free and clear which was. We then reduced with a 38+ 3 trial poly, felt like we could probably get a +6.  We then went ahead and removed the trial from the humerus.  We placed drill holes for repair of the subscap.  We then impacted the HA- coated stem press-fit using impaction grafting technique with available bone graft from the head.  We had excellent stability with that implant, set on 10 degrees of retroversion.  Once we had the HA stem in, we trialed with a +6 and that was perfect fit, so we selected the real 38+ 6 poly, impacted that in position, reduced the shoulder with a nice little pop.  The axillary nerve was not under undue tension.  We repaired the subscap anatomically back to bone with sutures that were through the drill holes in the bone and also additional suture through bone, made a very, very stout anatomic repair, replaced with that and did not impede range of motion for the shoulder, and we could externally rotate about 35-40 degrees.  At this point, we irrigated thoroughly.  We closed the deltopectoral interval with 0 Vicryl suture, followed by 2-0 Vicryl for subcutaneous closure, and 4-0 Monocryl for skin.  Steri- Strips applied, followed by sterile dressing.  The patient tolerated the surgery  well.     [name], M.D.   ______________________________ [name], M.D.    SRN/MEDQ  D:  05/14/2016  T:  05/14/2016  Job:  [id number]

## 2016-05-15 NOTE — Discharge Summary (Signed)
Physician Discharge Summary   Patient ID: ANWAR MASTEN MRN: [id number] DOB/AGE: Sep 27, 1945 71 y.o.  Admit date: 05/14/2016 Discharge date: 05/15/2016  Admission Diagnoses:  Active Problems:   S/P shoulder replacement, right   Discharge Diagnoses:  Same   Surgeries: Procedure(s): RIGHT REVERSE SHOULDER ARTHROPLASTY on 05/14/2016   Consultants: OT  Discharged Condition: Stable  Hospital Course: [name] is an 71 y.o. 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  They were brought to the operating room on 05/14/2016 and underwent the above named procedures.    The patient had an uncomplicated hospital course and was stable for discharge.  Recent vital signs:  Vitals:   05/15/16 0019 05/15/16 0616  BP: 117/62 123/69  Pulse: 77 63  Resp: 18 18  Temp: 98.2 F (36.8 C) 98.1 F (36.7 C)    Recent laboratory studies:  Results for orders placed or performed during the hospital encounter of 05/14/16  Hemoglobin and hematocrit, blood  Result Value Ref Range   Hemoglobin 10.7 (L) 12.0 - 15.0 g/dL   HCT 33.2 (L) 36.0 - [id number] %  Basic metabolic panel  Result Value Ref Range   Sodium 139 135 - 145 mmol/L   Potassium 4.3 3.5 - 5.1 mmol/L   Chloride 107 101 - 111 mmol/L   CO2 26 22 - 32 mmol/L   Glucose, Bld 143 (H) 65 - 99 mg/dL   BUN 18 6 - 20 mg/dL   Creatinine, Ser 0.76 0.44 - 1.00 mg/dL   Calcium 8.6 (L) 8.9 - 10.3 mg/dL   GFR calc non Af Amer >60 >60 mL/min   GFR calc Af Amer >60 >60 mL/min   Anion gap 6 5 - 15    Discharge Medications:   Allergies as of 05/15/2016      Reactions   Ativan [lorazepam] Other (See Comments)   Agitation, aggressive actions, significant mental changes   Buprenorphine Hcl Itching, Rash   Fentanyl Itching   Morphine And Related Itching, Rash      Medication List    TAKE these medications   acetaminophen 500 MG tablet Commonly  known as:  TYLENOL Take 1,000 mg by mouth every 6 (six) hours as needed (for pain.).   albuterol 108 (90 Base) MCG/ACT inhaler Commonly known as:  PROVENTIL HFA;VENTOLIN HFA Inhale 1-2 puffs into the lungs every 6 (six) hours as needed for shortness of breath or wheezing.   Biotin 10000 MCG Tabs Take 10,000 mcg by mouth at bedtime.   cholecalciferol 1000 units tablet Commonly known as:  VITAMIN D Take by mouth 2 (two) times daily.   clorazepate 7.5 MG tablet Commonly known as:  TRANXENE Take 7.5 mg by mouth 3 (three) times daily as needed for anxiety.   diclofenac sodium 1 % Gel Commonly known as:  VOLTAREN Apply 1 g topically daily as needed (for shoulder pain.).   diltiazem 180 MG 24 hr capsule Commonly known as:  CARTIA XT Take 1 capsule (180 mg total) by mouth daily.   DULoxetine 60 MG capsule Commonly known as:  CYMBALTA Take 60 mg by mouth daily with supper.   losartan 25 MG tablet Commonly known as:  COZAAR Take 25 mg by mouth 2 (two) times daily.   methocarbamol 500 MG tablet Commonly known as:  ROBAXIN Take 1 tablet (500 mg total) by mouth 3 (three) times daily as needed.   multivitamin with minerals Tabs  tablet Take 1 tablet by mouth 2 (two) times daily. Centrum   ondansetron 4 MG tablet Commonly known as:  ZOFRAN Take 4 mg by mouth every 8 (eight) hours as needed for nausea or vomiting.   oxyCODONE-acetaminophen 5-325 MG tablet Commonly known as:  ROXICET Take 1-2 tablets by mouth every 4 (four) hours as needed for severe pain.   pantoprazole 40 MG tablet Commonly known as:  PROTONIX Take 1 tablet (40 mg total) by mouth daily. Take 1 tablet daily every morning. What changed:  when to take this  additional instructions   RESTORA Caps Take 1 capsule by mouth daily. What changed:  when to take this   simvastatin 20 MG tablet Commonly known as:  ZOCOR Take 20 mg by mouth daily.   sucralfate 1 g tablet Commonly known as:  CARAFATE Take 1  tablet (1 g total) by mouth 4 (four) times daily -  with meals and at bedtime. May dissolve tablet in 1 tablespoon of water to take as a slurry   traMADol 50 MG tablet Commonly known as:  ULTRAM Take 50 mg by mouth every 6 (six) hours as needed (for pain.).   vitamin B-12 1000 MCG tablet Commonly known as:  CYANOCOBALAMIN Take 1,000 mcg by mouth daily.       Diagnostic Studies: Ct Abdomen Pelvis W Contrast  Result Date: 04/28/2016 CLINICAL DATA:  Acute epigastric pain with nausea over the last 2 weeks, prior cholecystectomy and gastric sleeve surgery EXAM: CT ABDOMEN AND PELVIS WITH CONTRAST TECHNIQUE: Multidetector CT imaging of the abdomen and pelvis was performed using the standard protocol following bolus administration of intravenous contrast. CONTRAST:  173mL ISOVUE-300 IOPAMIDOL (ISOVUE-300) INJECTION 61% COMPARISON:  CT abdomen pelvis of 10/13/2014 FINDINGS: Lower chest: The lung bases are clear.  No pleural effusion is seen. Hepatobiliary: The liver enhances with no focal abnormality and no ductal dilatation is seen. Surgical clips are present from prior cholecystectomy. Pancreas: The pancreas is normal in size and the pancreatic duct is not dilated. Spleen: The spleen is unremarkable. Adrenals/Urinary Tract: The adrenal glands appear normal although there is calcification of the right adrenal gland which is stable possibly due to prior hemorrhage. The kidneys enhance with no calculi and there is no evidence of hydronephrosis. The ureters appear normal in caliber with the distal ureters obscured by artifacts created by bilateral total hip replacements. Also the urinary bladder is not well seen due to the bilateral hip replacements. Stomach/Bowel: Changes of prior gastric sleeve surgery are noted. A few small bowel loops are slightly prominent proximally but no definite small-bowel obstruction is seen. There are multiple rectosigmoid colon diverticula present but no diverticulitis is noted.  Diverticula also are scattered throughout the remainder of the colon. The terminal ileum is unremarkable. The appendix is not definitely seen. Vascular/Lymphatic: Moderate abdominal aortic atherosclerotic change is present. No adenopathy is seen. Reproductive: The uterus has previously been resected. No adnexal lesion is seen. No definite fluid is noted within the pelvis. However lower portions of pelvis are obscured by linear artifacts created by the total hip replacements. Other: None. 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 The lumbar vertebrae are normal alignment with diffuse degenerative disc disease. IMPRESSION: 1. Slight prominence of small bowel loops in the upper abdomen without definite obstruction. Consider followup plain films of the abdomen with supine and erect views if symptoms persist or worsen to exclude developing partial small bowel obstruction. 2. Multiple colonic diverticula are scattered  throughout the colon. No diverticulitis. 3. Moderate abdominal aortic atherosclerosis. Electronically Signed   By: Ivar Drape M.D.   On: 04/28/2016 16:03   Dg Shoulder Right Port  Result Date: 05/14/2016 CLINICAL DATA:  Status post right total shoulder replacement EXAM: PORTABLE RIGHT SHOULDER COMPARISON:  None. FINDINGS: Frontal view obtained. There is a total shoulder prosthesis on the right with prosthetic components well-seated. No acute fracture or dislocation. Air in the joint is an expected postoperative finding. Visualized right lung clear. IMPRESSION: Total shoulder prosthesis on the right appears well-seated on frontal view. No fracture or dislocation. Electronically Signed   By: Lowella Grip III M.D.   On: 05/14/2016 13:50    Disposition: home  Discharge Instructions    Call MD / Call 911    Complete by:  As directed    If you experience chest pain or shortness of breath, CALL 911 and be transported to the hospital emergency  room.  If you develope a fever above 101 F, pus (white drainage) or increased drainage or redness at the wound, or calf pain, call your surgeon's office.   Constipation Prevention    Complete by:  As directed    Drink plenty of fluids.  Prune juice may be helpful.  You may use a stool softener, such as Colace (over the counter) 100 mg twice a day.  Use MiraLax (over the counter) for constipation as needed.   Diet - low sodium heart healthy    Complete by:  As directed    Driving restrictions    Complete by:  As directed    No driving for 4 weeks   Increase activity slowly as tolerated    Complete by:  As directed       Follow-up Information    Reynolds Kittel,STEVEN R, MD. Call in 2 weeks.   Specialty:  Orthopedic Surgery Why:  [phone] Contact information: [address] [place] [address] [phone]            Signed: Augustin Schooling 05/15/2016, 7:51 AM

## 2016-05-15 NOTE — Progress Notes (Signed)
Orthopedics Progress Note  Subjective: Minimal pain this morning  Objective:  Vitals:   05/15/16 0019 05/15/16 0616  BP: 117/62 123/69  Pulse: 77 63  Resp: 18 18  Temp: 98.2 F (36.8 C) 98.1 F (36.7 C)    General: Awake and alert  Musculoskeletal: dressing CDI, NVI Neurovascularly intact  Lab Results  Component Value Date   WBC 5.2 05/10/2016   HGB 10.7 (L) 05/15/2016   HCT 33.2 (L) 05/15/2016   MCV 97.4 05/10/2016   PLT 202 05/10/2016       Component Value Date/Time   NA 139 05/15/2016 0312   K 4.3 05/15/2016 0312   CL 107 05/15/2016 0312   CO2 26 05/15/2016 0312   GLUCOSE 143 (H) 05/15/2016 0312   BUN 18 05/15/2016 0312   CREATININE 0.76 05/15/2016 0312   CALCIUM 8.6 (L) 05/15/2016 0312   GFRNONAA >60 05/15/2016 0312   GFRAA >60 05/15/2016 0312    Lab Results  Component Value Date   INR 1.00 11/12/2014   INR 1.39 11/06/2014   INR 1.27 09/29/2014    Assessment/Plan: POD #1 s/p Procedure(s): RIGHT REVERSE SHOULDER ARTHROPLASTY Stable this morning for Discharge after therapy and after next antibiotic dose Follow up in two weeks in the office  Remo Lipps R. Veverly Fells, MD 05/15/2016 7:48 AM

## 2016-05-15 NOTE — Op Note (Deleted)
  The note originally documented on this encounter has been moved the the encounter in which it belongs.  

## 2016-05-17 ENCOUNTER — Encounter (HOSPITAL_COMMUNITY): Payer: Self-pay | Admitting: Orthopedic Surgery

## 2016-05-18 ENCOUNTER — Ambulatory Visit: Payer: Self-pay | Admitting: Gastroenterology

## 2016-05-27 DIAGNOSIS — Z471 Aftercare following joint replacement surgery: Secondary | ICD-10-CM | POA: Diagnosis not present

## 2016-05-27 DIAGNOSIS — Z96611 Presence of right artificial shoulder joint: Secondary | ICD-10-CM | POA: Diagnosis not present

## 2016-05-27 DIAGNOSIS — M19011 Primary osteoarthritis, right shoulder: Secondary | ICD-10-CM | POA: Diagnosis not present

## 2016-06-08 ENCOUNTER — Ambulatory Visit: Payer: Self-pay | Admitting: Skilled Nursing Facility1

## 2016-06-17 DIAGNOSIS — M79631 Pain in right forearm: Secondary | ICD-10-CM | POA: Diagnosis not present

## 2016-06-17 DIAGNOSIS — Z96611 Presence of right artificial shoulder joint: Secondary | ICD-10-CM | POA: Diagnosis not present

## 2016-06-17 DIAGNOSIS — Z471 Aftercare following joint replacement surgery: Secondary | ICD-10-CM | POA: Diagnosis not present

## 2016-06-25 DIAGNOSIS — M79631 Pain in right forearm: Secondary | ICD-10-CM | POA: Diagnosis not present

## 2016-06-30 DIAGNOSIS — M79631 Pain in right forearm: Secondary | ICD-10-CM | POA: Diagnosis not present

## 2016-07-02 DIAGNOSIS — M79631 Pain in right forearm: Secondary | ICD-10-CM | POA: Diagnosis not present

## 2016-07-06 DIAGNOSIS — Z96611 Presence of right artificial shoulder joint: Secondary | ICD-10-CM | POA: Diagnosis not present

## 2016-07-06 DIAGNOSIS — Z471 Aftercare following joint replacement surgery: Secondary | ICD-10-CM | POA: Diagnosis not present

## 2016-07-07 DIAGNOSIS — M79631 Pain in right forearm: Secondary | ICD-10-CM | POA: Diagnosis not present

## 2016-07-09 DIAGNOSIS — M79631 Pain in right forearm: Secondary | ICD-10-CM | POA: Diagnosis not present

## 2016-07-14 DIAGNOSIS — M79631 Pain in right forearm: Secondary | ICD-10-CM | POA: Diagnosis not present

## 2016-07-16 DIAGNOSIS — S46211A Strain of muscle, fascia and tendon of other parts of biceps, right arm, initial encounter: Secondary | ICD-10-CM | POA: Diagnosis not present

## 2016-07-19 DIAGNOSIS — S46211A Strain of muscle, fascia and tendon of other parts of biceps, right arm, initial encounter: Secondary | ICD-10-CM | POA: Diagnosis not present

## 2016-07-20 DIAGNOSIS — Z96611 Presence of right artificial shoulder joint: Secondary | ICD-10-CM | POA: Diagnosis not present

## 2016-07-20 DIAGNOSIS — M5412 Radiculopathy, cervical region: Secondary | ICD-10-CM | POA: Diagnosis not present

## 2016-07-20 DIAGNOSIS — M19011 Primary osteoarthritis, right shoulder: Secondary | ICD-10-CM | POA: Diagnosis not present

## 2016-07-20 DIAGNOSIS — Z471 Aftercare following joint replacement surgery: Secondary | ICD-10-CM | POA: Diagnosis not present

## 2016-07-22 DIAGNOSIS — M7031 Other bursitis of elbow, right elbow: Secondary | ICD-10-CM | POA: Diagnosis not present

## 2016-07-27 ENCOUNTER — Other Ambulatory Visit: Payer: Self-pay | Admitting: Cardiovascular Disease

## 2016-07-28 DIAGNOSIS — M5412 Radiculopathy, cervical region: Secondary | ICD-10-CM | POA: Diagnosis not present

## 2016-08-10 DIAGNOSIS — M25511 Pain in right shoulder: Secondary | ICD-10-CM | POA: Diagnosis not present

## 2016-08-12 DIAGNOSIS — M5412 Radiculopathy, cervical region: Secondary | ICD-10-CM | POA: Diagnosis not present

## 2016-08-13 DIAGNOSIS — M5412 Radiculopathy, cervical region: Secondary | ICD-10-CM | POA: Diagnosis not present

## 2016-08-18 DIAGNOSIS — M5412 Radiculopathy, cervical region: Secondary | ICD-10-CM | POA: Diagnosis not present

## 2016-08-19 DIAGNOSIS — Z471 Aftercare following joint replacement surgery: Secondary | ICD-10-CM | POA: Diagnosis not present

## 2016-08-19 DIAGNOSIS — Z96611 Presence of right artificial shoulder joint: Secondary | ICD-10-CM | POA: Diagnosis not present

## 2016-08-19 DIAGNOSIS — M19011 Primary osteoarthritis, right shoulder: Secondary | ICD-10-CM | POA: Diagnosis not present

## 2016-08-19 DIAGNOSIS — M5412 Radiculopathy, cervical region: Secondary | ICD-10-CM | POA: Diagnosis not present

## 2016-08-20 ENCOUNTER — Ambulatory Visit: Payer: Medicare Other | Admitting: Cardiovascular Disease

## 2016-08-23 ENCOUNTER — Other Ambulatory Visit: Payer: Self-pay | Admitting: Physician Assistant

## 2016-08-25 ENCOUNTER — Encounter: Payer: Self-pay | Admitting: Cardiovascular Disease

## 2016-08-25 ENCOUNTER — Ambulatory Visit (INDEPENDENT_AMBULATORY_CARE_PROVIDER_SITE_OTHER): Payer: Medicare Other | Admitting: Cardiovascular Disease

## 2016-08-25 DIAGNOSIS — I480 Paroxysmal atrial fibrillation: Secondary | ICD-10-CM

## 2016-08-25 MED ORDER — LOSARTAN POTASSIUM 25 MG PO TABS: 25.0000 mg | ORAL_TABLET | Freq: Two times a day (BID) | ORAL | 11 refills | Status: DC

## 2016-08-25 MED ORDER — DILTIAZEM HCL ER COATED BEADS 180 MG PO CP24: 180.0000 mg | ORAL_CAPSULE | Freq: Every day | ORAL | 11 refills | Status: DC

## 2016-08-25 MED ORDER — PANTOPRAZOLE SODIUM 40 MG PO TBEC: 40.0000 mg | DELAYED_RELEASE_TABLET | Freq: Every morning | ORAL | 3 refills | Status: DC

## 2016-08-25 MED ORDER — SIMVASTATIN 20 MG PO TABS: 20.0000 mg | ORAL_TABLET | Freq: Every day | ORAL | 11 refills | Status: AC

## 2016-08-25 NOTE — Assessment & Plan Note (Signed)
History of essential hypertension blood pressures measured today 124/84. She is on Cartia XT and losartan. Continue current meds at current dosing

## 2016-08-25 NOTE — Patient Instructions (Signed)

## 2016-08-25 NOTE — Progress Notes (Signed)
08/25/2016 [name]   Feb 11, 1946  [id number]  Primary Physician [name], MD Primary Cardiologist: Lorretta Harp MD Lupe Carney, [place]  HPI:  The patient is a 71 year old, severely overweight, married Caucasian female who was seen by Dr. Aldona Bar in the office on September 08, 2011, for chest pain. I last saw her in the office 07/22/15.Marland KitchenHer risk factors include hypertension and hyperlipidemia. She also has GERD. She was admitted for heart cath which I performed radially on September 10, 2011, that was essentially normal with normal LV function. Her 2D echo was normal as well. Her major complaints have been progressive dyspnea on exertion. She does have reactive airway disease and had pneumonia in the recent past as well. 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 She is seeing Dr. Halford Chessman for pulmonary evaluation for her back here for evaluation of sinus tachycardia. She really denies chest pain but does get dyspneic on exertion which was a complaint when I saw her 2 years ago as well. Recent thyroid function tests were normal. An event monitor showed sinus rhythm, sinus tachycardia with episodes of paroxysmal atrial fibrillation. Based on her age, is gender and history of hypertension she would be a candidate for oral anticoagulation. Because of a CHA2DS2VASC score of 3, she was begun on Eiquis oral anticoagulations.She has had bariatric surgery performed by Dr. 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 Alvan Dame. She has since lost 90 pounds and is now ambulatory.she denies chest pain or shortness of breath. She is much more active and has more energy. A 30 day event monitor showed sinus rhythm with PACs but no evidence of PAF last year and therefore I discontinued oral anticoagulation. She recently had a right shoulder replacement by Dr. Veverly Fells and slowly recuperating from this. She is  still suffering from vocal cord dysfunction and episodic dyspnea that we do not think this is cardiovascular in nature.   Current Outpatient Prescriptions  Medication Sig Dispense Refill  . acetaminophen (TYLENOL) 500 MG tablet Take 1,000 mg by mouth every 6 (six) hours as needed (for pain.).     [name] albuterol (PROVENTIL HFA;VENTOLIN HFA) 108 (90 Base) MCG/ACT inhaler Inhale 1-2 puffs into the lungs every 6 (six) hours as needed for shortness of breath or wheezing.    . Biotin 10000 MCG TABS Take 10,000 mcg by mouth at bedtime.     . cholecalciferol (VITAMIN D) 1000 units tablet Take by mouth 2 (two) times daily.    . clorazepate (TRANXENE) 7.5 MG tablet Take 7.5 mg by mouth 3 (three) times daily as needed for anxiety.    [name] diltiazem (CARTIA XT) 180 MG 24 hr capsule Take 1 capsule (180 mg total) by mouth daily. 30 capsule 11  . DULoxetine (CYMBALTA) 60 MG capsule Take 60 mg by mouth daily with supper.     . losartan (COZAAR) 25 MG tablet Take 1 tablet (25 mg total) by mouth 2 (two) times daily. 30 tablet 11  . methocarbamol (ROBAXIN) 500 MG tablet Take 1 tablet (500 mg total) by mouth 3 (three) times daily as needed. 60 tablet 1  . Multiple Vitamin (MULTIVITAMIN WITH MINERALS) TABS tablet Take 1 tablet by mouth 2 (two) times daily. Centrum    . ondansetron (ZOFRAN) 4 MG tablet Take 4 mg by mouth every 8 (eight) hours as needed for nausea or vomiting.    [name]  pantoprazole (PROTONIX) 40 MG tablet Take 1 tablet (40 mg total) by mouth every morning. 90 tablet 3  . Probiotic Product (RESTORA) CAPS Take 1 capsule by mouth daily. (Patient taking differently: Take 1 capsule by mouth daily with supper. ) 30 capsule 2  . simvastatin (ZOCOR) 20 MG tablet Take 1 tablet (20 mg total) by mouth daily. 30 tablet 11  . sucralfate (CARAFATE) 1 g tablet Take 1 tablet (1 g total) by mouth 4 (four) times daily -  with meals and at bedtime. May dissolve tablet in 1 tablespoon of water to take as a slurry 120 tablet 1  .  traMADol (ULTRAM) 50 MG tablet Take 50 mg by mouth every 6 (six) hours as needed (for pain.).     [name] vitamin B-12 (CYANOCOBALAMIN) 1000 MCG tablet Take 1,000 mcg by mouth daily.     No current facility-administered medications for this visit.     Allergies  Allergen Reactions  . Ativan [Lorazepam] Other (See Comments)    Agitation, aggressive actions, significant mental changes  . Buprenorphine Hcl Itching and Rash  . Fentanyl Itching  . Morphine And Related Itching and Rash    Social History   Social History  . Marital status: Married    Spouse name: [name]  . Number of children: [name]  . Years of education: [name]   Occupational History  . retired Retired   Social History Main Topics  . Smoking status: Never Smoker  . Smokeless tobacco: Never Used  . Alcohol use No  . Drug use: No  . Sexual activity: Yes   Other Topics Concern  . Not on file   Social History Narrative  . No narrative on file     Review of Systems: General: negative for chills, fever, night sweats or weight changes.  Cardiovascular: negative for chest pain, dyspnea on exertion, edema, orthopnea, palpitations, paroxysmal nocturnal dyspnea or shortness of breath Dermatological: negative for rash Respiratory: negative for cough or wheezing Urologic: negative for hematuria Abdominal: negative for nausea, vomiting, diarrhea, bright red blood per rectum, melena, or hematemesis Neurologic: negative for visual changes, syncope, or dizziness All other systems reviewed and are otherwise negative except as noted above.    Blood pressure 124/84, height 5' 5.5" (1.664 m), weight 190 lb 9.6 oz (86.5 kg).  General appearance: alert and no distress Neck: no adenopathy, no carotid bruit, no JVD, supple, symmetrical, trachea midline and thyroid not enlarged, symmetric, no tenderness/mass/nodules Lungs: clear to auscultation bilaterally Heart: regular rate and rhythm, S1, S2 normal, no murmur, click, rub or  gallop Extremities: extremities normal, atraumatic, no cyanosis or edema  EKG not performed today  ASSESSMENT AND PLAN:   HTN (hypertension) History of essential hypertension blood pressures measured today 124/84. She is on Cartia XT and losartan. Continue current meds at current dosing  Hyperlipemia History of hyperlipidemia on statin therapy followed by her PCP  Paroxysmal atrial fibrillation History of paroxysmal atrial ablation in the past on oral anticoagulation up until last year when an event monitor showed no A. fib and I discontinued her Eliquis.      Lorretta Harp MD FACP,FACC,FAHA, [facility] 08/25/2016 2:19 PM

## 2016-08-25 NOTE — Assessment & Plan Note (Signed)
History of paroxysmal atrial ablation in the past on oral anticoagulation up until last year when an event monitor showed no A. fib and I discontinued her Eliquis.

## 2016-08-25 NOTE — Assessment & Plan Note (Signed)
History of hyperlipidemia on statin therapy followed by her PCP. 

## 2016-08-25 NOTE — Addendum Note (Signed)
Addended by: Therisa Doyne on: 08/25/2016 02:21 PM   Modules accepted: Orders

## 2016-08-27 DIAGNOSIS — Z1231 Encounter for screening mammogram for malignant neoplasm of breast: Secondary | ICD-10-CM | POA: Diagnosis not present

## 2016-08-30 DIAGNOSIS — R300 Dysuria: Secondary | ICD-10-CM | POA: Diagnosis not present

## 2016-08-30 DIAGNOSIS — N390 Urinary tract infection, site not specified: Secondary | ICD-10-CM | POA: Diagnosis not present

## 2016-09-20 DIAGNOSIS — L218 Other seborrheic dermatitis: Secondary | ICD-10-CM | POA: Diagnosis not present

## 2016-09-20 DIAGNOSIS — L570 Actinic keratosis: Secondary | ICD-10-CM | POA: Diagnosis not present

## 2016-09-20 DIAGNOSIS — L821 Other seborrheic keratosis: Secondary | ICD-10-CM | POA: Diagnosis not present

## 2016-09-29 ENCOUNTER — Encounter (INDEPENDENT_AMBULATORY_CARE_PROVIDER_SITE_OTHER): Payer: Medicare Other | Admitting: Family Medicine

## 2016-09-29 DIAGNOSIS — Z0289 Encounter for other administrative examinations: Secondary | ICD-10-CM

## 2016-09-30 DIAGNOSIS — R5383 Other fatigue: Secondary | ICD-10-CM | POA: Diagnosis not present

## 2016-09-30 DIAGNOSIS — Z6834 Body mass index (BMI) 34.0-34.9, adult: Secondary | ICD-10-CM | POA: Diagnosis not present

## 2016-09-30 DIAGNOSIS — H539 Unspecified visual disturbance: Secondary | ICD-10-CM | POA: Diagnosis not present

## 2016-09-30 DIAGNOSIS — Z0000 Encounter for general adult medical examination without abnormal findings: Secondary | ICD-10-CM | POA: Diagnosis not present

## 2016-09-30 DIAGNOSIS — F3342 Major depressive disorder, recurrent, in full remission: Secondary | ICD-10-CM | POA: Diagnosis not present

## 2016-09-30 DIAGNOSIS — Z1389 Encounter for screening for other disorder: Secondary | ICD-10-CM | POA: Diagnosis not present

## 2016-09-30 DIAGNOSIS — R7301 Impaired fasting glucose: Secondary | ICD-10-CM | POA: Diagnosis not present

## 2016-09-30 DIAGNOSIS — I10 Essential (primary) hypertension: Secondary | ICD-10-CM | POA: Diagnosis not present

## 2016-09-30 DIAGNOSIS — E668 Other obesity: Secondary | ICD-10-CM | POA: Diagnosis not present

## 2016-09-30 LAB — BASIC METABOLIC PANEL WITH GFR
BUN: 25 — AB (ref 4–21)
Creatinine: 0.9 (ref <=1.1)
Glucose: 96
Potassium: 4.2 (ref 3.4–5.3)
Sodium: 142 (ref 137–147)

## 2016-09-30 LAB — HEPATIC FUNCTION PANEL
ALT: 16 (ref 7–35)
AST: 23 (ref 13–35)
Alkaline Phosphatase: 77 (ref 25–125)
Bilirubin, Total: 0.3

## 2016-09-30 LAB — CBC AND DIFFERENTIAL: WBC: 4.7

## 2016-09-30 LAB — TSH: TSH: 1.6 (ref <=5.90)

## 2016-09-30 LAB — VITAMIN B12: Vitamin B-12: >2000

## 2016-09-30 NOTE — Anesthesia Postprocedure Evaluation (Signed)
Anesthesia Post Note  Patient: [name]  Procedure(s) Performed: Procedure(s) (LRB): RIGHT REVERSE SHOULDER ARTHROPLASTY (Right)     Anesthesia Post Evaluation  Last Vitals:  Vitals:   05/15/16 0019 05/15/16 0616  BP: 117/62 123/69  Pulse: 77 63  Resp: 18 18  Temp: 36.8 C 36.7 C    Last Pain:  Vitals:   05/15/16 0745  TempSrc:   PainSc: Asleep                 Shannen Vernon A.

## 2016-09-30 NOTE — Addendum Note (Signed)
Addendum  created 09/30/16 1553 by Josephine Igo, MD   Sign clinical note

## 2016-10-08 ENCOUNTER — Encounter (INDEPENDENT_AMBULATORY_CARE_PROVIDER_SITE_OTHER): Payer: Self-pay | Admitting: Family Medicine

## 2016-10-08 ENCOUNTER — Other Ambulatory Visit (INDEPENDENT_AMBULATORY_CARE_PROVIDER_SITE_OTHER): Payer: Self-pay | Admitting: Family Medicine

## 2016-10-08 ENCOUNTER — Ambulatory Visit (INDEPENDENT_AMBULATORY_CARE_PROVIDER_SITE_OTHER): Payer: Medicare Other | Admitting: Family Medicine

## 2016-10-08 VITALS — BP 104/70 | HR 78 | Temp 98.0°F | Resp 14 | Ht 63.0 in | Wt 193.0 lb

## 2016-10-08 DIAGNOSIS — R5383 Other fatigue: Secondary | ICD-10-CM | POA: Diagnosis not present

## 2016-10-08 DIAGNOSIS — Z6834 Body mass index (BMI) 34.0-34.9, adult: Secondary | ICD-10-CM | POA: Diagnosis not present

## 2016-10-08 DIAGNOSIS — E559 Vitamin D deficiency, unspecified: Secondary | ICD-10-CM

## 2016-10-08 DIAGNOSIS — R0609 Other forms of dyspnea: Secondary | ICD-10-CM | POA: Diagnosis not present

## 2016-10-08 DIAGNOSIS — Z1331 Encounter for screening for depression: Secondary | ICD-10-CM

## 2016-10-08 DIAGNOSIS — R739 Hyperglycemia, unspecified: Secondary | ICD-10-CM | POA: Diagnosis not present

## 2016-10-08 DIAGNOSIS — Z1389 Encounter for screening for other disorder: Secondary | ICD-10-CM | POA: Diagnosis not present

## 2016-10-08 DIAGNOSIS — E669 Obesity, unspecified: Secondary | ICD-10-CM

## 2016-10-09 LAB — HEMOGLOBIN A1C
Est. average glucose Bld gHb Est-mCnc: 108 mg/dL
Hgb A1c MFr Bld: 5.4 % (ref 4.8–5.6)

## 2016-10-09 LAB — COMPREHENSIVE METABOLIC PANEL
A/G RATIO: 1.9 (ref 1.2–2.2)
ALBUMIN: 3.8 g/dL (ref 3.5–4.8)
ALT: 15 IU/L (ref 0–32)
AST: 24 IU/L (ref 0–40)
Alkaline Phosphatase: 81 IU/L (ref 39–117)
BILIRUBIN TOTAL: 0.4 mg/dL (ref 0.0–1.2)
BUN / CREAT RATIO: 30 — AB (ref 12–28)
BUN: 21 mg/dL (ref 8–27)
CALCIUM: 9.2 mg/dL (ref 8.7–10.3)
CHLORIDE: 107 mmol/L — AB (ref 96–106)
CO2: 24 mmol/L (ref 20–29)
Creatinine, Ser: 0.7 mg/dL (ref 0.57–1.00)
GFR, EST AFRICAN AMERICAN: 102 mL/min/{1.73_m2} (ref >59)
GFR, EST NON AFRICAN AMERICAN: 88 mL/min/{1.73_m2} (ref >59)
GLOBULIN, TOTAL: 2 g/dL (ref 1.5–4.5)
Glucose: 93 mg/dL (ref 65–99)
POTASSIUM: 4.6 mmol/L (ref 3.5–5.2)
Sodium: 144 mmol/L (ref 134–144)
TOTAL PROTEIN: 5.8 g/dL — AB (ref 6.0–8.5)

## 2016-10-09 LAB — INSULIN, RANDOM: INSULIN: 9.8 u[IU]/mL (ref 2.6–24.9)

## 2016-10-09 LAB — VITAMIN D 25 HYDROXY (VIT D DEFICIENCY, FRACTURES): VIT D 25 HYDROXY: 46 ng/mL (ref 30.0–100.0)

## 2016-10-11 NOTE — Progress Notes (Signed)
Office: [phone]  /  Fax: [phone]   Dear Dr. Redmond Pulling,   Thank you for referring [name] to our clinic. The following note includes my evaluation and treatment recommendations.  HPI:   Chief Complaint: OBESITY    [name] has been referred by [name]. Redmond Pulling, MD for consultation regarding her obesity and obesity related comorbidities.    [name] (MR# [id number]) is a 71 y.o. female who presents on 10/08/2016 for obesity evaluation and treatment. Current BMI is Body mass index is 34.19 kg/m.[name] [name] had Gastric Sleeve in 2016. [name] heaviest pre-operative weight was 328 lbs and she lost down to 173 lbs and started re-gaining weight soon after reaching her lowest weight and has now re-gained approximately 20 lbs from their lowest postoperative weight.     [name] attended our information session and states she is currently in the action stage of change and ready to dedicate time achieving and maintaining a healthier weight. [name] requests to join our [facility] program to help manage their weight and relearn how to use their weight loss surgery to achieve improved health.    [name] states her family eats meals together she thinks her family will eat healthier with  her her desired weight loss is 23 lbs she has been heavy most of  her life she started gaining weight after 1st child was born her heaviest weight ever was 328 lbs. she has significant food cravings issues  she snacks frequently in the evenings she frequently makes poor food choices she frequently eats larger portions than normal  she has binge eating behaviors   [name] feels her energy is lower than it should be. This has worsened with weight gain and has not worsened recently. [name] admits to daytime somnolence and  admits to waking up still tired. [name] is at risk for obstructive sleep apnea. Patent has a history of symptoms of daytime Theresa and morning Theresa.  [name] generally gets 6 hours of sleep per night, and states they generally have restless sleep. Snoring is not present. Apneic episodes are not present. Epworth Sleepiness Score is 2  Dyspnea on exertion [name] notes increasing shortness of breath with exercising and seems to be worsening over time with weight gain. She notes getting out of breath sooner with activity than she used to. This has not gotten worse recently. [name] denies orthopnea.  Vitamin D deficiency [name] is status post weight loss surgery and has a diagnosis of vitamin D deficiency. She is currently on multi-vitamin and vit D and admits Theresa but denies nausea, vomiting or muscle weakness.  Hyperglycemia [name] has a history of elevated fasting glucose of 143 this year without a diagnosis of diabetes. She has no recent A1c and [name] admits to polyphagia.  Depression Screen [name] (modified PHQ-9) score was  Depression screen PHQ 2/9 10/08/2016  Decreased Interest 1  Down, Depressed, Hopeless 1  PHQ - 2 Score 2  Altered sleeping 0  Tired, decreased energy 0  Change in appetite 0  Feeling bad or failure about yourself  0  Trouble concentrating 0  Moving slowly or fidgety/restless 0  Suicidal thoughts 0  PHQ-9 Score 2    ALLERGIES: Allergies  Allergen Reactions  . Ativan [Lorazepam] Other (See Comments)    Agitation, aggressive actions, significant mental changes  . Buprenorphine Hcl Itching and Rash  . Fentanyl Itching  . Morphine And Related Itching and Rash    MEDICATIONS: Current Outpatient Prescriptions on File Prior  to Visit  Medication Sig Dispense Refill  . acetaminophen (TYLENOL) 500 MG tablet Take 1,000 mg by mouth every 6 (six) hours as needed (for pain.).     [name] albuterol (PROVENTIL HFA;VENTOLIN HFA) 108 (90 Base) MCG/ACT inhaler Inhale 1-2 puffs into the lungs every 6 (six) hours as needed for shortness of breath or wheezing.    . Biotin 10000 MCG TABS Take 10,000 mcg  by mouth at bedtime.     . cholecalciferol (VITAMIN D) 1000 units tablet Take by mouth 2 (two) times daily.    . clorazepate (TRANXENE) 7.5 MG tablet Take 7.5 mg by mouth 3 (three) times daily as needed for anxiety.    [name] diltiazem (CARTIA XT) 180 MG 24 hr capsule Take 1 capsule (180 mg total) by mouth daily. 30 capsule 11  . DULoxetine (CYMBALTA) 60 MG capsule Take 60 mg by mouth daily with supper.     . losartan (COZAAR) 25 MG tablet Take 1 tablet (25 mg total) by mouth 2 (two) times daily. 30 tablet 11  . Multiple Vitamin (MULTIVITAMIN WITH MINERALS) TABS tablet Take 1 tablet by mouth 2 (two) times daily. Centrum    . pantoprazole (PROTONIX) 40 MG tablet Take 1 tablet (40 mg total) by mouth every morning. 90 tablet 3  . Probiotic Product (RESTORA) CAPS Take 1 capsule by mouth daily. ([name] taking differently: Take 1 capsule by mouth daily with supper. ) 30 capsule 2  . simvastatin (ZOCOR) 20 MG tablet Take 1 tablet (20 mg total) by mouth daily. 30 tablet 11  . sucralfate (CARAFATE) 1 g tablet Take 1 tablet (1 g total) by mouth 4 (four) times daily -  with meals and at bedtime. May dissolve tablet in 1 tablespoon of water to take as a slurry 120 tablet 1  . traMADol (ULTRAM) 50 MG tablet Take 50 mg by mouth every 6 (six) hours as needed (for pain.).     [name] vitamin B-12 (CYANOCOBALAMIN) 1000 MCG tablet Take 1,000 mcg by mouth daily.     No current facility-administered medications on file prior to visit.     PAST MEDICAL HISTORY: Past Medical History:  Diagnosis Date  . Anemia   . Antral polyp    benign  . Anxiety   . Arthritis   . Asthma   . B12 deficiency   . Back pain   . Chest pain   . Constipation   . Depression   . Diverticulosis   . Gallbladder problem   . GERD (gastroesophageal reflux disease) 09/09/2011  . History of transfusion   . HTN (hypertension) 09/09/2011  . Hyperlipemia 09/09/2011  . Hypertension   . IBS (irritable bowel syndrome)   . Internal hemorrhoid   .  Joint pain   . Obesity   . Osteoarthritis   . Paroxysmal atrial fibrillation (HCC)   . PONV (postoperative nausea and vomiting)    with big surgeries, none with endos etc, with colonoscopy- 5-6 years ago could not swallow   . Shortness of breath   . Sleep apnea    borderline per [name] no cpap   . SOB (shortness of breath) on exertion   . Swallowing difficulty   . Tachycardia   . Unstable angina ([place]) 09/09/2011  . Vitamin D deficiency   . Vocal cord dysfunction 2013   swelling    PAST SURGICAL HISTORY: Past Surgical History:  Procedure Laterality Date  . ABDOMINAL HYSTERECTOMY  1988  . ANKLE FUSION Right    x 2  .  BACK SURGERY  [phone]   3 ruptured discs, lower back  . BILATERAL ANTERIOR TOTAL HIP ARTHROPLASTY Bilateral 11/12/2014   Procedure: BILATERAL ANTERIOR TOTAL HIP ARTHROPLASTY;  Surgeon: Paralee Cancel, MD;  Location: [facility];  Service: Orthopedics;  Laterality: Bilateral;  . BRAVO [place] STUDY [name] 03/06/2013   Procedure: BRAVO [place] STUDY;  Surgeon: Inda Castle, MD;  Location: [facility];  Service: Endoscopy;  Laterality: [name];  . CARDIAC CATHETERIZATION  2013   NORMAL  . CHOLECYSTECTOMY    . ELBOW SURGERY Left    removed bone chip  . ESOPHAGOGASTRODUODENOSCOPY [name] 03/06/2013   Procedure: ESOPHAGOGASTRODUODENOSCOPY (EGD);  Surgeon: Inda Castle, MD;  Location: [facility];  Service: Endoscopy;  Laterality: [name];  . FOOT OSTEOTOMY W/ PLANTAR FASCIA RELEASE Left   . ILIOTIBIAL BAND RELEASE Right 1997  . JOINT REPLACEMENT    . LAPAROSCOPIC GASTRIC SLEEVE RESECTION WITH HIATAL HERNIA REPAIR  08/20/2014   Procedure: LAPAROSCOPIC GASTRIC SLEEVE RESECTION WITH HIATAL HERNIA  REPAIR AND  UPPER ENDOSCOPY;  Surgeon: Johnathan Hausen, MD;  Location: [facility];  Service: General;;  . LEFT HEART CATHETERIZATION WITH CORONARY ANGIOGRAM Bilateral 09/10/2011   Procedure: LEFT HEART CATHETERIZATION WITH CORONARY ANGIOGRAM;  Surgeon: Lorretta Harp, MD;  Location: [facility] CATH LAB;   Service: Cardiovascular;  Laterality: Bilateral;  . NASAL SINUS SURGERY  1997  . REVERSE SHOULDER ARTHROPLASTY Right 05/14/2016   Procedure: RIGHT REVERSE SHOULDER ARTHROPLASTY;  Surgeon: Netta Cedars, MD;  Location: [facility];  Service: Orthopedics;  Laterality: Right;  . SHOULDER SURGERY Left   . tfc wrist Left 1997  . thumb replacement Bilateral    one 2009 and one 2014  . TOTAL ANKLE REPLACEMENT Right    x 3  . TOTAL ANKLE REPLACEMENT Left   . TOTAL ANKLE REPLACEMENT    . TOTAL HIP ARTHROPLASTY Bilateral   . TOTAL KNEE ARTHROPLASTY Bilateral    left 2002, right 2012  . TOTAL SHOULDER ARTHROPLASTY Right 05/14/2016  . TRANSTHORACIC ECHOCARDIOGRAM  09/09/2011   MILD CONCENTRIC LVH. TRACE MITRAL REGURG. TRACE TR. MILD AORTIC REGURG.  . 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 UPPER GI ENDOSCOPY  08/20/2014   Procedure: UPPER GI ENDOSCOPY;  Surgeon: Johnathan Hausen, MD;  Location: [facility];  Service: General;;  . WRIST SURGERY Left 1997, 2009     SOCIAL HISTORY: Social History  Substance Use Topics  . Smoking status: Never Smoker  . Smokeless tobacco: Never Used  . Alcohol use No    FAMILY HISTORY: Family History  Problem Relation Age of Onset  . Hypertension Mother   . Diabetes Mother   . Arthritis Mother   . Esophageal cancer Mother   . Cancer Mother   . Esophageal cancer Father 28  . Cancer Father   . Alcoholism Father   . Other Sister        kidney mass removed  . Hypertension Sister   . Heart disease Maternal Grandmother   . Stroke Paternal Grandmother   . Coronary artery disease Neg Hx   . Colon cancer Neg Hx   . Liver disease Neg Hx   . Kidney disease Neg Hx     ROS: Review of Systems  Constitutional: Positive for malaise/Theresa.  HENT: Positive for hearing loss.   Eyes:       Wear Glasses or Contacts Blurry or Double Vision Floaters  Respiratory: Positive for shortness of breath (on exertion).   Cardiovascular: Negative for orthopnea.  Very  Cold Feet or Hands   Gastrointestinal: Positive for constipation and diarrhea. Negative for nausea and vomiting.  Genitourinary: Positive for frequency.  Musculoskeletal:       Negative muscle weakness Muscle Stiffness Red or Swollen Joints  Skin:       Hair or Nail Changes  Neurological: Positive for dizziness and headaches.  Endo/Heme/Allergies: Bruises/bleeds easily (easy bruising).       Polyphagia  Psychiatric/Behavioral: The [name] has insomnia.     PHYSICAL EXAM: Blood pressure 104/70, pulse 78, temperature 98 F (36.7 C), temperature source Oral, resp. rate 14, height 5\' 3"  (1.6 m), weight 193 lb (87.5 kg), SpO2 99 %. Body mass index is 34.19 kg/m. Physical Exam  Constitutional: She is oriented to person, place, and time. She appears well-developed and well-nourished.  Cardiovascular: Normal rate.   Pulmonary/Chest: Effort normal.  Musculoskeletal: Normal range of motion.  Neurological: She is oriented to person, place, and time.  Skin: Skin is warm and dry.  Psychiatric: She has a normal Green and affect. Her behavior is normal.  Vitals reviewed.   RECENT LABS AND TESTS: BMET    Component Value Date/Time   NA 142 09/30/2016   K 4.2 09/30/2016   CL 107 05/15/2016 0312   CO2 26 05/15/2016 0312   GLUCOSE 143 (H) 05/15/2016 0312   BUN 25 (A) 09/30/2016   CREATININE 0.9 09/30/2016   CREATININE 0.76 05/15/2016 0312   CALCIUM 8.6 (L) 05/15/2016 0312   GFRNONAA >60 05/15/2016 0312   GFRAA >60 05/15/2016 0312   Lab Results  Component Value Date   HGBA1C 5.8 (H) 10/13/2014   No results found for: INSULIN CBC    Component Value Date/Time   WBC 4.7 09/30/2016   WBC 5.2 05/10/2016 1304   RBC 3.91 05/10/2016 1304   HGB 10.7 (L) 05/15/2016 0312   HCT 33.2 (L) 05/15/2016 0312   PLT 202 05/10/2016 1304   MCV 97.4 05/10/2016 1304   MCH 31.7 05/10/2016 1304   MCHC 32.5 05/10/2016 1304   RDW 12.8 05/10/2016 1304   LYMPHSABS 2.0 04/21/2016 1226   MONOABS 0.5  04/21/2016 1226   EOSABS 0.1 04/21/2016 1226   BASOSABS 0.0 04/21/2016 1226   Iron/TIBC/Ferritin/ %Sat No results found for: IRON, TIBC, FERRITIN, IRONPCTSAT Lipid Panel  No results found for: CHOL, TRIG, HDL, CHOLHDL, VLDL, LDLCALC, LDLDIRECT Hepatic Function Panel     Component Value Date/Time   PROT 5.9 (L) 04/21/2016 1226   ALBUMIN 3.5 04/21/2016 1226   AST 23 09/30/2016   ALT 16 09/30/2016   ALKPHOS 77 09/30/2016   BILITOT 0.5 04/21/2016 1226      Component Value Date/Time   TSH 1.60 09/30/2016   TSH 0.771 10/13/2014 1700   TSH 1.37 08/03/2013 1421    ECG  shows NSR with a rate of 79 BPM INDIRECT CALORIMETER done today shows a VO2 of 237 and a REE of 1648. Her calculated basal metabolic rate is 5093 thus her basal metabolic rate is better than expected.    ASSESSMENT AND PLAN: Other Theresa - Plan: EKG 12-Lead, Comprehensive metabolic panel  Dyspnea on exertion  Hyperglycemia - Plan: Hemoglobin A1c, Insulin, random  Vitamin D deficiency - Plan: VITAMIN D 25 Hydroxy (Vit-D Deficiency, Fractures)  Depression screening  Class 1 obesity with serious comorbidity and body mass index (BMI) of 34.0 to 34.9 in adult, unspecified obesity type  PLAN: Theresa Keaton was informed that her Theresa may be related to obesity, depression or many other causes. Labs will  be ordered, and in the meanwhile Juliett has agreed to work on diet, exercise and weight loss to help with Theresa. Proper sleep hygiene was discussed including the need for 7-8 hours of quality sleep each night. A sleep study was not ordered based on symptoms and Epworth score.  Dyspnea on exertion Maykayla's shortness of breath appears to be obesity related and exercise induced. She has agreed to work on weight loss and gradually increase exercise to treat her exercise induced shortness of breath. If Maizie follows our instructions and loses weight without improvement of her shortness of breath, we will plan to  refer to pulmonology. We will monitor this condition regularly. [name] agrees to this plan.  Vitamin D Deficiency [name] was informed that low vitamin D levels contributes to Theresa and are associated with obesity, breast, and colon cancer. She agrees to continue to take multi-vitamin and vitamin D, we will check labs and will follow up for routine testing of vitamin D, at least 2-3 times per year. She is at risk of becoming over-replaced. She agrees to not increase her dose unless he discusses this with [place] first and will follow up with [place] in 1 week.  Hyperglycemia Fasting labs will be obtained and results with be discussed with [name] in 1 week at her follow up visit. In the meanwhile [name] was started on a lower simple carbohydrate diet and will work on weight loss efforts.  Depression Screen [name] had a negative depression screening. Depression is commonly associated with obesity and often results in emotional eating behaviors. We will monitor this closely and work on CBT to help improve the non-hunger eating patterns. Referral to Psychology may be required if no improvement is seen as she continues in our clinic.  Obesity [name] is currently in the action stage of change and her goal is to continue with weight loss efforts. I recommend [name] begin the structured treatment plan as follows:  She has agreed to keep a food journal with 1100 to 1300 calories and 70+ grams of protein  [name] has been instructed to eventually work up to a goal of 150 minutes of combined cardio and strengthening exercise per week for weight loss and overall health benefits. We discussed the following Behavioral Modification Strategies today: increasing lean protein intake and keep a strict food journal  Shawnae has agreed to join our [employer] and follow up with our clinic in 1 week. She was informed of the importance of frequent follow up visits to maximize her success with intensive lifestyle  modifications for her multiple health conditions. She was informed we would discuss her lab results at her next visit unless there is a critical issue that needs to be addressed sooner. Hasini agreed to keep her next visit at the agreed upon time to discuss these results.  I, Doreene Nest, am acting as transcriptionist for Dennard Nip, MD  I have reviewed the above documentation for accuracy and completeness, and I agree with the above. -Dennard Nip, MD  OBESITY BEHAVIORAL INTERVENTION VISIT  Today's visit was # 1 out of 38.  Starting weight: 193 lbs Starting date: 10/08/16 Today's weight : 193 lbs Today's date: 10/08/2016 Total lbs lost to date: 0 (Patients must lose 7 lbs in the first 6 months to continue with counseling)   ASK: We discussed the diagnosis of obesity with [name] today and Mickayla agreed to give [place] permission to discuss obesity behavioral modification therapy today.  ASSESS: Tala has the diagnosis of obesity and  her BMI today is 34.3 Gurleen is in the action stage of change   ADVISE: Nuha was educated on the multiple health risks of obesity as well as the benefit of weight loss to improve her health. She was advised of the need for long term treatment and the importance of lifestyle modifications.  AGREE: Multiple dietary modification options and treatment options were discussed and  Ann-Marie agreed to keep a food journal with 1100 to 1300 calories and 70+ grams of protein daily We discussed the following Behavioral Modification Strategies today: increasing lean protein intake and keep a strict food journal

## 2016-10-15 ENCOUNTER — Ambulatory Visit (INDEPENDENT_AMBULATORY_CARE_PROVIDER_SITE_OTHER): Payer: Medicare Other | Admitting: Family Medicine

## 2016-10-15 VITALS — BP 95/63 | HR 85 | Temp 97.5°F | Ht 63.0 in | Wt 188.0 lb

## 2016-10-15 DIAGNOSIS — E860 Dehydration: Secondary | ICD-10-CM | POA: Diagnosis not present

## 2016-10-15 DIAGNOSIS — E559 Vitamin D deficiency, unspecified: Secondary | ICD-10-CM

## 2016-10-15 MED ORDER — VITAMIN D 1000 UNITS PO TABS: 1000.0000 [IU] | ORAL_TABLET | Freq: Two times a day (BID) | ORAL | 0 refills | Status: DC

## 2016-10-18 NOTE — Progress Notes (Signed)
Office: [phone]  /  Fax: [phone]   HPI:   Chief Complaint: OBESITY [name] is here to discuss her progress with her obesity treatment plan. She is on the  keep a food journal with 1100 to 1300 calories and 70+ grams of protein  and is following her eating plan approximately 98 % of the time. She states she is exercising 0 minutes 0 times per week. [name] has done well with weight loss and has been learning how to journal and is doing better with this. She is working on increasing protein and planning meals ahead of time. She has questions about celebration eating strategies. Her weight is 188 lb (85.3 kg) today and has had a weight loss of 5 pounds over a period of 1 week since her last visit. She has lost 5 lbs since starting treatment with [place].  Vitamin D deficiency [name] has a diagnosis of vitamin D deficiency. She is currently taking OTC vit D and denies nausea, vomiting or muscle weakness.  Dehydration [name] states she is drinking at least 64 ounces of H2O per day and she denies headache or lightheadedness.   ALLERGIES: Allergies  Allergen Reactions  . Ativan [Lorazepam] Other (See Comments)    Agitation, aggressive actions, significant mental changes  . Buprenorphine Hcl Itching and Rash  . Fentanyl Itching  . Morphine And Related Itching and Rash    MEDICATIONS: Current Outpatient Prescriptions on File Prior to Visit  Medication Sig Dispense Refill  . acetaminophen (TYLENOL) 500 MG tablet Take 1,000 mg by mouth every 6 (six) hours as needed (for pain.).     [name] albuterol (PROVENTIL HFA;VENTOLIN HFA) 108 (90 Base) MCG/ACT inhaler Inhale 1-2 puffs into the lungs every 6 (six) hours as needed for shortness of breath or wheezing.    . Biotin 10000 MCG TABS Take 10,000 mcg by mouth at bedtime.     . cetirizine (ZYRTEC) 10 MG chewable tablet Chew 10 mg by mouth daily.    . clorazepate (TRANXENE) 7.5 MG tablet Take 7.5 mg by mouth 3 (three) times daily as needed for  anxiety.    [name] diltiazem (CARTIA XT) 180 MG 24 hr capsule Take 1 capsule (180 mg total) by mouth daily. 30 capsule 11  . DULoxetine (CYMBALTA) 60 MG capsule Take 60 mg by mouth daily with supper.     . losartan (COZAAR) 25 MG tablet Take 1 tablet (25 mg total) by mouth 2 (two) times daily. 30 tablet 11  . Multiple Vitamin (MULTIVITAMIN WITH MINERALS) TABS tablet Take 1 tablet by mouth 2 (two) times daily. Centrum    . pantoprazole (PROTONIX) 40 MG tablet Take 1 tablet (40 mg total) by mouth every morning. 90 tablet 3  . Probiotic Product (RESTORA) CAPS Take 1 capsule by mouth daily. (Patient taking differently: Take 1 capsule by mouth daily with supper. ) 30 capsule 2  . simvastatin (ZOCOR) 20 MG tablet Take 1 tablet (20 mg total) by mouth daily. 30 tablet 11  . sucralfate (CARAFATE) 1 g tablet Take 1 tablet (1 g total) by mouth 4 (four) times daily -  with meals and at bedtime. May dissolve tablet in 1 tablespoon of water to take as a slurry 120 tablet 1  . traMADol (ULTRAM) 50 MG tablet Take 50 mg by mouth every 6 (six) hours as needed (for pain.).     [name] vitamin B-12 (CYANOCOBALAMIN) 1000 MCG tablet Take 1,000 mcg by mouth daily.     No current facility-administered medications on file  prior to visit.     PAST MEDICAL HISTORY: Past Medical History:  Diagnosis Date  . Anemia   . Antral polyp    benign  . Anxiety   . Arthritis   . Asthma   . B12 deficiency   . Back pain   . Chest pain   . Constipation   . Depression   . Diverticulosis   . Gallbladder problem   . GERD (gastroesophageal reflux disease) 09/09/2011  . History of transfusion   . HTN (hypertension) 09/09/2011  . Hyperlipemia 09/09/2011  . Hypertension   . IBS (irritable bowel syndrome)   . Internal hemorrhoid   . Joint pain   . Obesity   . Osteoarthritis   . Paroxysmal atrial fibrillation (HCC)   . PONV (postoperative nausea and vomiting)    with big surgeries, none with endos etc, with colonoscopy- 5-6 years ago  could not swallow   . Shortness of breath   . Sleep apnea    borderline per patient no cpap   . SOB (shortness of breath) on exertion   . Swallowing difficulty   . Tachycardia   . Unstable angina ([place]) 09/09/2011  . Vitamin D deficiency   . Vocal cord dysfunction 2013   swelling    PAST SURGICAL HISTORY: Past Surgical History:  Procedure Laterality Date  . ABDOMINAL HYSTERECTOMY  1988  . ANKLE FUSION Right    x 2  . BACK SURGERY  [phone]   3 ruptured discs, lower back  . BILATERAL ANTERIOR TOTAL HIP ARTHROPLASTY Bilateral 11/12/2014   Procedure: BILATERAL ANTERIOR TOTAL HIP ARTHROPLASTY;  Surgeon: Paralee Cancel, MD;  Location: [facility];  Service: Orthopedics;  Laterality: Bilateral;  . BRAVO [place] STUDY [name] 03/06/2013   Procedure: BRAVO [place] STUDY;  Surgeon: Inda Castle, MD;  Location: [facility];  Service: Endoscopy;  Laterality: [name];  . CARDIAC CATHETERIZATION  2013   NORMAL  . CHOLECYSTECTOMY    . ELBOW SURGERY Left    removed bone chip  . ESOPHAGOGASTRODUODENOSCOPY [name] 03/06/2013   Procedure: ESOPHAGOGASTRODUODENOSCOPY (EGD);  Surgeon: Inda Castle, MD;  Location: [facility];  Service: Endoscopy;  Laterality: [name];  . FOOT OSTEOTOMY W/ PLANTAR FASCIA RELEASE Left   . ILIOTIBIAL BAND RELEASE Right 1997  . JOINT REPLACEMENT    . LAPAROSCOPIC GASTRIC SLEEVE RESECTION WITH HIATAL HERNIA REPAIR  08/20/2014   Procedure: LAPAROSCOPIC GASTRIC SLEEVE RESECTION WITH HIATAL HERNIA  REPAIR AND  UPPER ENDOSCOPY;  Surgeon: Johnathan Hausen, MD;  Location: [facility];  Service: General;;  . LEFT HEART CATHETERIZATION WITH CORONARY ANGIOGRAM Bilateral 09/10/2011   Procedure: LEFT HEART CATHETERIZATION WITH CORONARY ANGIOGRAM;  Surgeon: Lorretta Harp, MD;  Location: [facility] C[facility] LAB;  Service: Cardiovascular;  Laterality: Bilateral;  . NASAL SINUS SURGERY  1997  . REVERSE SHOULDER ARTHROPLASTY Right 05/14/2016   Procedure: RIGHT REVERSE SHOULDER ARTHROPLASTY;  Surgeon: Netta Cedars, MD;   Location: [place];  Service: Orthopedics;  Laterality: Right;  . SHOULDER SURGERY Left   . tfc wrist Left 1997  . thumb replacement Bilateral    one 2009 and one 2014  . TOTAL ANKLE REPLACEMENT Right    x 3  . TOTAL ANKLE REPLACEMENT Left   . TOTAL ANKLE REPLACEMENT    . TOTAL HIP ARTHROPLASTY Bilateral   . TOTAL KNEE ARTHROPLASTY Bilateral    left 2002, right 2012  . TOTAL SHOULDER ARTHROPLASTY Right 05/14/2016  . TRANSTHORACIC ECHOCARDIOGRAM  09/09/2011   MILD CONCENTRIC LVH. TRACE MITRAL REGURG. TRACE TR. MILD AORTIC  [place].  . 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 UPPER GI ENDOSCOPY  08/20/2014   Procedure: UPPER GI ENDOSCOPY;  Surgeon: Johnathan Hausen, MD;  Location: [facility];  Service: General;;  . WRIST SURGERY Left 1997, 2009     SOCIAL HISTORY: Social History  Substance Use Topics  . Smoking status: Never Smoker  . Smokeless tobacco: Never Used  . Alcohol use No    FAMILY HISTORY: Family History  Problem Relation Age of Onset  . Hypertension Mother   . Diabetes Mother   . Arthritis Mother   . Esophageal cancer Mother   . Cancer Mother   . Esophageal cancer Father 51  . Cancer Father   . Alcoholism Father   . Other Sister        kidney mass removed  . Hypertension Sister   . Heart disease Maternal Grandmother   . Stroke Paternal Grandmother   . Coronary artery disease Neg Hx   . Colon cancer Neg Hx   . Liver disease Neg Hx   . Kidney disease Neg Hx     ROS: Review of Systems  Constitutional: Positive for weight loss.  Gastrointestinal: Negative for nausea and vomiting.  Musculoskeletal:       Negative muscle weakness  Neurological: Negative for headaches.       Negative lightheadedness    PHYSICAL EXAM: Blood pressure 95/63, pulse 85, temperature (!) 97.5 F (36.4 C), temperature source Oral, height 5\' 3"  (1.6 m), weight 188 lb (85.3 kg), SpO2 100 %. Body mass index is 33.3 kg/m. Physical Exam  Constitutional: She is oriented to person,  place, and time. She appears well-developed and well-nourished.  Cardiovascular: Normal rate.   Pulmonary/Chest: Effort normal.  Musculoskeletal: Normal range of motion.  Neurological: She is oriented to person, place, and time.  Skin: Skin is warm and dry.  Psychiatric: She has a normal mood and affect. Her behavior is normal.  Vitals reviewed.   RECENT LABS AND TESTS: BMET    Component Value Date/Time   NA 144 10/08/2016 1147   K 4.6 10/08/2016 1147   CL 107 (H) 10/08/2016 1147   CO2 24 10/08/2016 1147   GLUCOSE 93 10/08/2016 1147   GLUCOSE 143 (H) 05/15/2016 0312   BUN 21 10/08/2016 1147   CREATININE 0.70 10/08/2016 1147   CALCIUM 9.2 10/08/2016 1147   GFRNONAA 88 10/08/2016 1147   GFRAA 102 10/08/2016 1147   Lab Results  Component Value Date   HGBA1C 5.4 10/08/2016   HGBA1C 5.8 (H) 10/13/2014   Lab Results  Component Value Date   INSULIN 9.8 10/08/2016   CBC    Component Value Date/Time   WBC 4.7 09/30/2016   WBC 5.2 05/10/2016 1304   RBC 3.91 05/10/2016 1304   HGB 10.7 (L) 05/15/2016 0312   HCT 33.2 (L) 05/15/2016 0312   PLT 202 05/10/2016 1304   MCV 97.4 05/10/2016 1304   MCH 31.7 05/10/2016 1304   MCHC 32.5 05/10/2016 1304   RDW 12.8 05/10/2016 1304   LYMPHSABS 2.0 04/21/2016 1226   MONOABS 0.5 04/21/2016 1226   EOSABS 0.1 04/21/2016 1226   BASOSABS 0.0 04/21/2016 1226   Iron/TIBC/Ferritin/ %Sat No results found for: IRON, TIBC, FERRITIN, IRONPCTSAT Lipid Panel  No results found for: CHOL, TRIG, HDL, CHOLHDL, VLDL, LDLCALC, LDLDIRECT Hepatic Function Panel     Component Value Date/Time   PROT 5.8 (L) 10/08/2016 1147   ALBUMIN 3.8 10/08/2016 1147   AST 24 10/08/2016 1147  ALT 15 10/08/2016 1147   ALKPHOS 81 10/08/2016 1147   BILITOT 0.4 10/08/2016 1147      Component Value Date/Time   TSH 1.60 09/30/2016   TSH 0.771 10/13/2014 1700   TSH 1.37 08/03/2013 1421    ASSESSMENT AND PLAN: Dehydration  Vitamin D deficiency - Plan:  cholecalciferol (VITAMIN D) 1000 units tablet  PLAN:  Vitamin D Deficiency Leda was informed that low vitamin D levels contributes to fatigue and are associated with obesity, breast, and colon cancer. She agrees to increase OTC Vit D to 1,000 IU bid and we will re-check labs in 3 months and will follow up for routine testing of vitamin D, at least 2-3 times per year. She was informed of the risk of over-replacement of vitamin D and agrees to not increase her dose unless he discusses this with [place] first.  Dehydration Yoselin agrees to increase H2O to at least 80 ounces per day and we will re-check labs in 3 months and she will follow up as needed.  We spent > than 50% of the 30 minute visit on the counseling as documented in the note.   Obesity Jullianna is currently in the action stage of change. As such, her goal is to continue with weight loss efforts She has agreed to keep a food journal with 1100 to 1300 calories and 70+ grams of protein  Pyper has been instructed to work up to a goal of 150 minutes of combined cardio and strengthening exercise per week for weight loss and overall health benefits. We discussed the following Behavioral Modification Strategies today: increasing lean protein intake, increase H2O intake and celebration eating strategies  Davida has agreed to follow up with our clinic as needed as she continues her weight loss journey. She was informed of the importance of frequent follow up visits to maximize her success with intensive lifestyle modifications for her multiple health conditions.  I, Doreene Nest, am acting as transcriptionist for Dennard Nip, MD  I have reviewed the above documentation for accuracy and completeness, and I agree with the above. -Dennard Nip, MD   OBESITY BEHAVIORAL INTERVENTION VISIT  Today's visit was # 2 out of 76.  Starting weight: 193 lbs Starting date: 10/08/16 Today's weight : 188 lbs Today's date: 10/15/2016 Total lbs  lost to date: 5 (Patients must lose 7 lbs in the first 6 months to continue with counseling)   ASK: We discussed the diagnosis of obesity with [name] today and Mar agreed to give [place] permission to discuss obesity behavioral modification therapy today.  ASSESS: Atia has the diagnosis of obesity and her BMI today is 33.4 Maida is in the action stage of change   ADVISE: Carra was educated on the multiple health risks of obesity as well as the benefit of weight loss to improve her health. She was advised of the need for long term treatment and the importance of lifestyle modifications.  AGREE: Multiple dietary modification options and treatment options were discussed and  Lakenya agreed to keep a food journal with 1100 to 1300 calories and 70+ grams of protein  We discussed the following Behavioral Modification Strategies today: increasing lean protein intake, increase H2O intake and celebration eating strategies

## 2016-10-21 ENCOUNTER — Ambulatory Visit (INDEPENDENT_AMBULATORY_CARE_PROVIDER_SITE_OTHER): Payer: Medicare Other | Admitting: Family Medicine

## 2016-10-21 ENCOUNTER — Encounter (INDEPENDENT_AMBULATORY_CARE_PROVIDER_SITE_OTHER): Payer: Self-pay

## 2016-10-28 ENCOUNTER — Ambulatory Visit (INDEPENDENT_AMBULATORY_CARE_PROVIDER_SITE_OTHER): Payer: Medicare Other | Admitting: Family Medicine

## 2016-10-28 VITALS — Ht 63.0 in | Wt 189.0 lb

## 2016-10-28 DIAGNOSIS — Z9189 Other specified personal risk factors, not elsewhere classified: Secondary | ICD-10-CM

## 2016-10-28 DIAGNOSIS — Z6833 Body mass index (BMI) 33.0-33.9, adult: Secondary | ICD-10-CM

## 2016-10-28 DIAGNOSIS — E669 Obesity, unspecified: Secondary | ICD-10-CM | POA: Diagnosis not present

## 2016-10-28 DIAGNOSIS — Z9884 Bariatric surgery status: Secondary | ICD-10-CM | POA: Diagnosis not present

## 2016-10-28 NOTE — Progress Notes (Deleted)
53

## 2016-11-01 NOTE — Progress Notes (Signed)
  Office: [phone]  /  Fax: [phone]  OBESITY AND PREVENTATIVE COUNSELING BEHAVIORAL INTERVENTION VISIT  Today's visit was # 4 out of 71, BOT# 2  Starting weight: 55 lbd Starting date: 10/08/16 Today's weight : Weight: 189 lb (85.7 kg)  Today's date: 10/28/16 Total lbs lost to date: 4 pounds (Patients must lose 7 lbs in the first 6 months to continue with counseling)  PREVENTATIVE COUNSELING: Tyreesha is at high risk of developing multiple serious health conditions including uncontrolled diabetes, coronary artery disease, heart failure, sleep apnea, chronic pain, depression, obesity related cancers and more due to her weight. These risks have been discussed in depth and Jarvis has agreed to work on the underlying disease of obesity to decrease the risk of developing any and all of these obesity related disease.  Emotional eating and strategies to address emotional eating were discussed at length today. Anelia agreed to implement these strategies and reduce the incidence of emotional eating to help her with her weight loss efforts.   ASK: We discussed the diagnosis of obesity with [name] today and Belem agreed to give [place] permission to discuss obesity behavioral modification therapy today.  ASSESS: George has the diagnosis of obesity and her BMI today is 33.5 Chidera is in the action stage of change   ADVISE: Angely was educated on the multiple health risks of obesity as well as the benefit of weight loss to improve her health. She was advised of the need for long term treatment and the importance of lifestyle modifications.  AGREE: Multiple dietary modification options and treatment options were discussed and  Alexarae agreed to keep a food journal with 1100 to 1300 calories and 70+ protein  We discussed the following Behavioral Modification Stratagies today: increasing lean protein intake, work on meal planning and easy cooking plans, emotional eating strategies and  avoiding temptations.  We spent > than 50% of the 15 minute visit on the counseling as documented in the note.

## 2016-11-02 DIAGNOSIS — H8110 Benign paroxysmal vertigo, unspecified ear: Secondary | ICD-10-CM | POA: Diagnosis not present

## 2016-11-02 DIAGNOSIS — I10 Essential (primary) hypertension: Secondary | ICD-10-CM | POA: Diagnosis not present

## 2016-11-02 DIAGNOSIS — Z6833 Body mass index (BMI) 33.0-33.9, adult: Secondary | ICD-10-CM | POA: Diagnosis not present

## 2016-11-04 ENCOUNTER — Ambulatory Visit (INDEPENDENT_AMBULATORY_CARE_PROVIDER_SITE_OTHER): Payer: Medicare Other | Admitting: Family Medicine

## 2016-11-10 DIAGNOSIS — R42 Dizziness and giddiness: Secondary | ICD-10-CM | POA: Diagnosis not present

## 2016-11-10 DIAGNOSIS — H903 Sensorineural hearing loss, bilateral: Secondary | ICD-10-CM | POA: Diagnosis not present

## 2016-11-10 DIAGNOSIS — H9312 Tinnitus, left ear: Secondary | ICD-10-CM | POA: Diagnosis not present

## 2016-11-11 ENCOUNTER — Ambulatory Visit (INDEPENDENT_AMBULATORY_CARE_PROVIDER_SITE_OTHER): Payer: Medicare Other | Admitting: Family Medicine

## 2016-11-11 VITALS — Ht 63.0 in | Wt 191.0 lb

## 2016-11-11 DIAGNOSIS — Z9884 Bariatric surgery status: Secondary | ICD-10-CM | POA: Diagnosis not present

## 2016-11-11 DIAGNOSIS — E669 Obesity, unspecified: Secondary | ICD-10-CM | POA: Diagnosis not present

## 2016-11-11 DIAGNOSIS — Z9189 Other specified personal risk factors, not elsewhere classified: Secondary | ICD-10-CM | POA: Diagnosis not present

## 2016-11-11 DIAGNOSIS — Z6833 Body mass index (BMI) 33.0-33.9, adult: Secondary | ICD-10-CM | POA: Diagnosis not present

## 2016-11-15 NOTE — Progress Notes (Signed)
  Office: [phone]  /  Fax: [phone]  OBESITY AND PREVENTATIVE COUNSELING BEHAVIORAL INTERVENTION VISIT  Today's visit was # 6 out of 22, BOT# 4  Starting weight: 193 lbs Starting date: 193 lbs Today's weight : Weight: 191 lb (86.6 kg)  Today's date: 11/11/16 Total lbs lost to date: 2 lbs.  (Patients must lose 7 lbs in the first 6 months to continue with counseling)  PREVENTATIVE COUNSELING: Mellina is at high risk of developing multiple serious health conditions including uncontrolled diabetes, coronary artery disease, heart failure, sleep apnea, chronic pain, depression, obesity related cancers and more due to her weight. These risks have been discussed in depth and [place] has agreed to work on the underlying disease of obesity to decrease the risk of developing any and all of these obesity related disease.  Shewanda struggled this week with her eating plan due to the death of a friend. She continued to journal and on average maintained her calorie goal however had trouble reaching her protein goal. Strategies for eating healthy during holidays, celebrations and vacations were discussed at length today. Behavior modifications techniques for dealing with feelings of guilt that may accompany overeating were also discussed at length.   ASK: We discussed the diagnosis of obesity with [name] today and Kadisha agreed to give [place] permission to discuss obesity behavioral modification therapy today.  ASSESS: Latandra has the diagnosis of obesity and her BMI today is 33.8 Ivyana is in the action stage of change   ADVISE: Nguyen was educated on the multiple health risks of obesity as well as the benefit of weight loss to improve her health. She was advised of the need for long term treatment and the importance of lifestyle modifications.  AGREE: Multiple dietary modification options and treatment options were discussed and  Abbegale agreed to keep a food journal with 1100 to 1300   calories and 70+ protein  We discussed the following Behavioral Modification Stratagies today: increasing lean protein intake, decreasing simple carbohydrates , work on meal planning and easy cooking plans, holiday eating strategies , emotional eating strategies, decrease snacking , ways to avoid boredom eating and avoiding temptations.  We spent > than 50% of the 15 minute visit on the counseling as documented in the note.

## 2016-11-25 ENCOUNTER — Ambulatory Visit (INDEPENDENT_AMBULATORY_CARE_PROVIDER_SITE_OTHER): Payer: Medicare Other | Admitting: Family Medicine

## 2016-11-25 VITALS — BP 118/75 | HR 88 | Temp 98.4°F | Ht 63.0 in | Wt 192.0 lb

## 2016-11-25 DIAGNOSIS — E669 Obesity, unspecified: Secondary | ICD-10-CM

## 2016-11-25 DIAGNOSIS — I10 Essential (primary) hypertension: Secondary | ICD-10-CM | POA: Diagnosis not present

## 2016-11-25 DIAGNOSIS — Z6834 Body mass index (BMI) 34.0-34.9, adult: Secondary | ICD-10-CM | POA: Diagnosis not present

## 2016-11-25 NOTE — Progress Notes (Signed)
Office: [phone]  /  Fax: [phone]   HPI:   Chief Complaint: OBESITY [name] is here to discuss her progress with her obesity treatment plan. She is on the keep a food journal with 1100 to 1300 calories and 70+ grams of protein daily and is following her eating plan approximately 33 % of the time. She states she is exercising 0 minutes 0 times per week. [name] has had increased temptations in the last few weeks and has gotten off track and has stopped journaling. She is also eating out more. Her weight is 192 lb (87.1 kg) today and has had a weight gain of 1 pound over a period of 6 weeks since her last visit. She has lost 1 lbs since starting treatment with [place].  [name] is status post weight loss surgery  Hypertension [name] is a 71 y.o. female with hypertension. She discontinued cozaar as directed and her blood Green is stable. She is no longer feeling lightheaded. [name] denies chest pain or shortness of breath on exertion. She is working weight loss to help control her blood Green with the goal of decreasing her risk of heart attack and stroke. [name] is currently controlled.    ALLERGIES: Allergies  Allergen Reactions  . Ativan [Lorazepam] Other (See Comments)    Agitation, aggressive actions, significant mental changes  . Buprenorphine Hcl Itching and Rash  . Fentanyl Itching  . Morphine And Related Itching and Rash    MEDICATIONS: Current Outpatient Prescriptions on File Prior to Visit  Medication Sig Dispense Refill  . acetaminophen (TYLENOL) 500 MG tablet Take 1,000 mg by mouth every 6 (six) hours as needed (for pain.).     [name] albuterol (PROVENTIL HFA;VENTOLIN HFA) 108 (90 Base) MCG/ACT inhaler Inhale 1-2 puffs into the lungs every 6 (six) hours as needed for shortness of breath or wheezing.    . Biotin 10000 MCG TABS Take 10,000 mcg by mouth at bedtime.     . cetirizine (ZYRTEC) 10 MG chewable tablet Chew 10 mg by  mouth daily.    . cholecalciferol (VITAMIN D) 1000 units tablet Take 1 tablet (1,000 Units total) by mouth 2 (two) times daily. 60 tablet 0  . clorazepate (TRANXENE) 7.5 MG tablet Take 7.5 mg by mouth 3 (three) times daily as needed for anxiety.    [name] diltiazem (CARTIA XT) 180 MG 24 hr capsule Take 1 capsule (180 mg total) by mouth daily. 30 capsule 11  . DULoxetine (CYMBALTA) 60 MG capsule Take 60 mg by mouth daily with supper.     . Multiple Vitamin (MULTIVITAMIN WITH MINERALS) TABS tablet Take 1 tablet by mouth 2 (two) times daily. Centrum    . pantoprazole (PROTONIX) 40 MG tablet Take 1 tablet (40 mg total) by mouth every morning. 90 tablet 3  . Probiotic Product (RESTORA) CAPS Take 1 capsule by mouth daily. (Patient taking differently: Take 1 capsule by mouth daily with supper. ) 30 capsule 2  . simvastatin (ZOCOR) 20 MG tablet Take 1 tablet (20 mg total) by mouth daily. 30 tablet 11  . sucralfate (CARAFATE) 1 g tablet Take 1 tablet (1 g total) by mouth 4 (four) times daily -  with meals and at bedtime. May dissolve tablet in 1 tablespoon of water to take as a slurry 120 tablet 1  . traMADol (ULTRAM) 50 MG tablet Take 50 mg by mouth every 6 (six) hours as needed (for pain.).     [name] vitamin B-12 (CYANOCOBALAMIN) 1000  MCG tablet Take 1,000 mcg by mouth daily.     No current facility-administered medications on file prior to visit.     PAST MEDICAL HISTORY: Past Medical History:  Diagnosis Date  . Anemia   . Antral polyp    benign  . Anxiety   . Arthritis   . Asthma   . B12 deficiency   . Back pain   . Chest pain   . Constipation   . Depression   . Diverticulosis   . Gallbladder problem   . GERD (gastroesophageal reflux disease) 09/09/2011  . History of transfusion   . HTN (hypertension) 09/09/2011  . Hyperlipemia 09/09/2011  . Hypertension   . IBS (irritable bowel syndrome)   . Internal hemorrhoid   . Joint pain   . Obesity   . Osteoarthritis   . Paroxysmal atrial fibrillation  (HCC)   . PONV (postoperative nausea and vomiting)    with big surgeries, none with endos etc, with colonoscopy- 5-6 years ago could not swallow   . Shortness of breath   . Sleep apnea    borderline per patient no cpap   . SOB (shortness of breath) on exertion   . Swallowing difficulty   . Tachycardia   . Unstable angina ([place]) 09/09/2011  . Vitamin D deficiency   . Vocal cord dysfunction 2013   swelling    PAST SURGICAL HISTORY: Past Surgical History:  Procedure Laterality Date  . ABDOMINAL HYSTERECTOMY  1988  . ANKLE FUSION Right    x 2  . BACK SURGERY  [phone]   3 ruptured discs, lower back  . BILATERAL ANTERIOR TOTAL HIP ARTHROPLASTY Bilateral 11/12/2014   Procedure: BILATERAL ANTERIOR TOTAL HIP ARTHROPLASTY;  Surgeon: Paralee Cancel, MD;  Location: [facility];  Service: Orthopedics;  Laterality: Bilateral;  . BRAVO [place] STUDY [name] 03/06/2013   Procedure: BRAVO [place] STUDY;  Surgeon: Inda Castle, MD;  Location: [facility];  Service: Endoscopy;  Laterality: [name];  . CARDIAC CATHETERIZATION  2013   NORMAL  . CHOLECYSTECTOMY    . ELBOW SURGERY Left    removed bone chip  . ESOPHAGOGASTRODUODENOSCOPY [name] 03/06/2013   Procedure: ESOPHAGOGASTRODUODENOSCOPY (EGD);  Surgeon: Inda Castle, MD;  Location: [facility];  Service: Endoscopy;  Laterality: [name];  . FOOT OSTEOTOMY W/ PLANTAR FASCIA RELEASE Left   . ILIOTIBIAL BAND RELEASE Right 1997  . JOINT REPLACEMENT    . LAPAROSCOPIC GASTRIC SLEEVE RESECTION WITH HIATAL HERNIA REPAIR  08/20/2014   Procedure: LAPAROSCOPIC GASTRIC SLEEVE RESECTION WITH HIATAL HERNIA  REPAIR AND  UPPER ENDOSCOPY;  Surgeon: Johnathan Hausen, MD;  Location: [facility];  Service: General;;  . LEFT HEART CATHETERIZATION WITH CORONARY ANGIOGRAM Bilateral 09/10/2011   Procedure: LEFT HEART CATHETERIZATION WITH CORONARY ANGIOGRAM;  Surgeon: Lorretta Harp, MD;  Location: [facility] CATH LAB;  Service: Cardiovascular;  Laterality: Bilateral;  . NASAL SINUS SURGERY  1997  .  REVERSE SHOULDER ARTHROPLASTY Right 05/14/2016   Procedure: RIGHT REVERSE SHOULDER ARTHROPLASTY;  Surgeon: Netta Cedars, MD;  Location: [facility];  Service: Orthopedics;  Laterality: Right;  . SHOULDER SURGERY Left   . tfc wrist Left 1997  . thumb replacement Bilateral    one 2009 and one 2014  . TOTAL ANKLE REPLACEMENT Right    x 3  . TOTAL ANKLE REPLACEMENT Left   . TOTAL ANKLE REPLACEMENT    . TOTAL HIP ARTHROPLASTY Bilateral   . TOTAL KNEE ARTHROPLASTY Bilateral    left 2002, right 2012  . TOTAL SHOULDER ARTHROPLASTY Right 05/14/2016  .  TRANSTHORACIC ECHOCARDIOGRAM  09/09/2011   MILD CONCENTRIC LVH. TRACE MITRAL REGURG. TRACE TR. MILD AORTIC REGURG.  . 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 UPPER GI ENDOSCOPY  08/20/2014   Procedure: UPPER GI ENDOSCOPY;  Surgeon: Johnathan Hausen, MD;  Location: [facility];  Service: General;;  . WRIST SURGERY Left 1997, 2009     SOCIAL HISTORY: Social History  Substance Use Topics  . Smoking status: Never Smoker  . Smokeless tobacco: Never Used  . Alcohol use No    FAMILY HISTORY: Family History  Problem Relation Age of Onset  . Hypertension Mother   . Diabetes Mother   . Arthritis Mother   . Esophageal cancer Mother   . Cancer Mother   . Esophageal cancer Father 40  . Cancer Father   . Alcoholism Father   . Other Sister        kidney mass removed  . Hypertension Sister   . Heart disease Maternal Grandmother   . Stroke Paternal Grandmother   . Coronary artery disease Neg Hx   . Colon cancer Neg Hx   . Liver disease Neg Hx   . Kidney disease Neg Hx     ROS: Review of Systems  Constitutional: Negative for weight loss.  Respiratory: Negative for shortness of breath (on exertion).   Cardiovascular: Negative for chest pain.  Neurological:       Negative lightheadedness    PHYSICAL EXAM: Blood Green 118/75, pulse 88, temperature 98.4 F (36.9 C), temperature source Oral, height 5\' 3"  (1.6 m), weight 192 lb (87.1 kg), SpO2  98 %. Body mass index is 34.01 kg/m. Physical Exam  Constitutional: She is oriented to person, place, and time. She appears well-developed and well-nourished.  Cardiovascular: Normal rate.   Pulmonary/Chest: Effort normal.  Musculoskeletal: Normal range of motion.  Neurological: She is oriented to person, place, and time.  Skin: Skin is warm and dry.  Psychiatric: She has a normal mood and affect. Her behavior is normal.  Vitals reviewed.   RECENT LABS AND TESTS: BMET    Component Value Date/Time   NA 144 10/08/2016 1147   K 4.6 10/08/2016 1147   CL 107 (H) 10/08/2016 1147   CO2 24 10/08/2016 1147   GLUCOSE 93 10/08/2016 1147   GLUCOSE 143 (H) 05/15/2016 0312   BUN 21 10/08/2016 1147   CREATININE 0.70 10/08/2016 1147   CALCIUM 9.2 10/08/2016 1147   GFRNONAA 88 10/08/2016 1147   GFRAA 102 10/08/2016 1147   Lab Results  Component Value Date   HGBA1C 5.4 10/08/2016   HGBA1C 5.8 (H) 10/13/2014   Lab Results  Component Value Date   INSULIN 9.8 10/08/2016   CBC    Component Value Date/Time   WBC 4.7 09/30/2016   WBC 5.2 05/10/2016 1304   RBC 3.91 05/10/2016 1304   HGB 10.7 (L) 05/15/2016 0312   HCT 33.2 (L) 05/15/2016 0312   PLT 202 05/10/2016 1304   MCV 97.4 05/10/2016 1304   MCH 31.7 05/10/2016 1304   MCHC 32.5 05/10/2016 1304   RDW 12.8 05/10/2016 1304   LYMPHSABS 2.0 04/21/2016 1226   MONOABS 0.5 04/21/2016 1226   EOSABS 0.1 04/21/2016 1226   BASOSABS 0.0 04/21/2016 1226   Iron/TIBC/Ferritin/ %Sat No results found for: IRON, TIBC, FERRITIN, IRONPCTSAT Lipid Panel  No results found for: CHOL, TRIG, HDL, CHOLHDL, VLDL, LDLCALC, LDLDIRECT Hepatic Function Panel     Component Value Date/Time   PROT 5.8 (L) 10/08/2016 1147  ALBUMIN 3.8 10/08/2016 1147   AST 24 10/08/2016 1147   ALT 15 10/08/2016 1147   ALKPHOS 81 10/08/2016 1147   BILITOT 0.4 10/08/2016 1147      Component Value Date/Time   TSH 1.60 09/30/2016   TSH 0.771 10/13/2014 1700   TSH 1.37  08/03/2013 1421    ASSESSMENT AND PLAN: Essential hypertension  Class 1 obesity with serious comorbidity and body mass index (BMI) of 34.0 to 34.9 in adult, unspecified obesity type  PLAN:  Hypertension We discussed sodium restriction, working on healthy weight loss, and a regular exercise program as the means to achieve improved blood Green control. [name] agreed with this plan and agreed to follow up as directed. We will continue to monitor her blood Green as well as her progress with the above lifestyle modifications. She will continue her medications as prescribed and will watch for signs of hypotension as she continues her lifestyle modifications. [name] agrees to increase her H2O intake and we will re-check blood Green in 2 weeks.  We spent > than 50% of the 15 minute visit on the counseling as documented in the note.  Obesity [name] is currently in the action stage of change. As such, her goal is to continue with weight loss efforts She has agreed to get back to journaling and will keep a food journal with 1300 calories and 75+ grams of protein daily [name] has been instructed to work up to a goal of 150 minutes of combined cardio and strengthening exercise per week for weight loss and overall health benefits. We discussed the following Behavioral Modification Strategies today: increase H2O intake and keep a strict food journal  [name] has agreed to follow up with our clinic in 2 to 3 weeks with our Dietician. She was informed of the importance of frequent follow up visits to maximize her success with intensive lifestyle modifications for her multiple health conditions.  I, [name], am acting as transcriptionist for Dennard Nip, MD  I have reviewed the above documentation for accuracy and completeness, and I agree with the above. -Dennard Nip, MD    OBESITY BEHAVIORAL INTERVENTION VISIT  Today's visit was # 3 out of 22.  Starting weight: 193  lbs Starting date: 10/08/16 Today's weight : 192 lbs Today's date: 11/25/2016 Total lbs lost to date: 1 (Patients must lose 7 lbs in the first 6 months to continue with counseling)   ASK: We discussed the diagnosis of obesity with [name] today and Kennedee agreed to give [place] permission to discuss obesity behavioral modification therapy today.  ASSESS: Inocencia has the diagnosis of obesity and her BMI today is 34.02 Lucianna is in the action stage of change   ADVISE: Joselin was educated on the multiple health risks of obesity as well as the benefit of weight loss to improve her health. She was advised of the need for long term treatment and the importance of lifestyle modifications.  AGREE: Multiple dietary modification options and treatment options were discussed and  Delainie agreed to keep a food journal with 1300 calories and 75+ grams of protein  We discussed the following Behavioral Modification Strategies today: increase H2O intake and keep a strict food journal

## 2016-11-30 DIAGNOSIS — H9312 Tinnitus, left ear: Secondary | ICD-10-CM | POA: Diagnosis not present

## 2016-11-30 DIAGNOSIS — H903 Sensorineural hearing loss, bilateral: Secondary | ICD-10-CM | POA: Diagnosis not present

## 2016-11-30 DIAGNOSIS — R42 Dizziness and giddiness: Secondary | ICD-10-CM | POA: Diagnosis not present

## 2016-12-15 ENCOUNTER — Encounter (INDEPENDENT_AMBULATORY_CARE_PROVIDER_SITE_OTHER): Payer: Self-pay

## 2016-12-15 ENCOUNTER — Ambulatory Visit (INDEPENDENT_AMBULATORY_CARE_PROVIDER_SITE_OTHER): Payer: Medicare Other | Admitting: Dietician

## 2016-12-23 ENCOUNTER — Ambulatory Visit (INDEPENDENT_AMBULATORY_CARE_PROVIDER_SITE_OTHER): Payer: Medicare Other | Admitting: Dietician

## 2016-12-23 VITALS — Ht 63.0 in | Wt 191.0 lb

## 2016-12-23 DIAGNOSIS — R05 Cough: Secondary | ICD-10-CM | POA: Diagnosis not present

## 2016-12-23 DIAGNOSIS — Z6834 Body mass index (BMI) 34.0-34.9, adult: Secondary | ICD-10-CM | POA: Diagnosis not present

## 2016-12-23 DIAGNOSIS — Z6833 Body mass index (BMI) 33.0-33.9, adult: Secondary | ICD-10-CM

## 2016-12-23 DIAGNOSIS — E669 Obesity, unspecified: Secondary | ICD-10-CM | POA: Diagnosis not present

## 2016-12-23 DIAGNOSIS — Z9884 Bariatric surgery status: Secondary | ICD-10-CM | POA: Diagnosis not present

## 2016-12-23 DIAGNOSIS — J209 Acute bronchitis, unspecified: Secondary | ICD-10-CM | POA: Diagnosis not present

## 2016-12-23 DIAGNOSIS — J45909 Unspecified asthma, uncomplicated: Secondary | ICD-10-CM | POA: Diagnosis not present

## 2016-12-23 DIAGNOSIS — Z9189 Other specified personal risk factors, not elsewhere classified: Secondary | ICD-10-CM | POA: Diagnosis not present

## 2016-12-23 DIAGNOSIS — I10 Essential (primary) hypertension: Secondary | ICD-10-CM | POA: Diagnosis not present

## 2016-12-23 NOTE — Progress Notes (Signed)
  Office: [phone]  /  Fax: [phone]  OBESITY AND PREVENTATIVE COUNSELING BEHAVIORAL INTERVENTION VISIT  Today's visit was # 4 out of 90  Starting weight: 11 Starting date: 10/08/16 Today's weight : Weight: 191 lb (86.6 kg)  Today's date: 12/23/2016 Total lbs lost to date: 2 (Patients must lose 7 lbs in the first 6 months to continue with counseling)  PREVENTATIVE COUNSELING: Jashira is at high risk of developing serious health conditions including uncontrolled diabetes, coronary artery disease, heart failure, sleep apnea, chronic pain, depression, obesity related cancers and more due to her weight. These risks have been discussed in depth and Daphane has agreed to work on the underlying disease of obesity to decrease the risk of developing any and all of these obesity related disease. She is s/p lap sleeve gastrectomy in 2016.  ASK: We discussed the diagnosis of obesity with [name] today and Jacari agreed to give [place] permission to discuss obesity behavioral modification therapy today. Veera reports currently suffering from an upper respiratory infection in which she states she just completed a course of prednisone. She reports she has not been following her treatment plan and is eating "carbs all day" despite not being excessively hungry. Her treatment plan is journaling her foods in the My Fitness Pal app, 1100-1300 calories and 70+ grams of protein and states she has not been journaling for the past month. She also reports eating dinner meals out frequently and night snacking. She states she is frustrated at her lack of weight loss and is motivated to get back to her plan.   ASSESS: Krissa has the diagnosis of obesity and her BMI today is 33.84 Jaryiah is in the action stage of change   ADVISE:  Jelisa was provided with greater than 30 minutes of  diabetes prevention counseling today. She is 71 y.o. female and has risk factors for diabetes including obesity. We  discussed intensive lifestyle modifications today with an emphasis on weight loss as well as increasing exercise and decreasing simple carbohydrates in her diet. Reviewed the importance of journaling her foods and educated at length on meal planning and menu options. Also discussed at length options and behavior modifications for night time snacking and eating out less often.    Megyn was educated on the multiple health risks of obesity as well as the benefit of weight loss to improve her health. She was advised of the need for long term treatment and the importance of lifestyle modifications.  AGREE: Multiple dietary modification options and treatment options were discussed and  Zya agreed to keep a food journal with 1100-1300 calories and 70+ protein  We discussed the following Behavioral Modification Stratagies today: increasing lean protein intake, work on meal planning and easy cooking plans, emotional eating strategies, ways to avoid boredom eating and ways to avoid night time snacking and written information was provided: smart fruits, Category 1 meal plan guidelines, 100 calorie snacks.   We spent 30 minutes discussing the above counseling today

## 2016-12-29 DIAGNOSIS — J309 Allergic rhinitis, unspecified: Secondary | ICD-10-CM | POA: Diagnosis not present

## 2016-12-29 DIAGNOSIS — H903 Sensorineural hearing loss, bilateral: Secondary | ICD-10-CM | POA: Diagnosis not present

## 2016-12-29 DIAGNOSIS — R05 Cough: Secondary | ICD-10-CM | POA: Diagnosis not present

## 2016-12-29 DIAGNOSIS — J209 Acute bronchitis, unspecified: Secondary | ICD-10-CM | POA: Diagnosis not present

## 2016-12-29 DIAGNOSIS — H9312 Tinnitus, left ear: Secondary | ICD-10-CM | POA: Diagnosis not present

## 2016-12-29 DIAGNOSIS — H8112 Benign paroxysmal vertigo, left ear: Secondary | ICD-10-CM | POA: Diagnosis not present

## 2016-12-29 DIAGNOSIS — Z6834 Body mass index (BMI) 34.0-34.9, adult: Secondary | ICD-10-CM | POA: Diagnosis not present

## 2016-12-29 DIAGNOSIS — J45909 Unspecified asthma, uncomplicated: Secondary | ICD-10-CM | POA: Diagnosis not present

## 2016-12-29 DIAGNOSIS — J069 Acute upper respiratory infection, unspecified: Secondary | ICD-10-CM | POA: Diagnosis not present

## 2017-01-10 ENCOUNTER — Ambulatory Visit (INDEPENDENT_AMBULATORY_CARE_PROVIDER_SITE_OTHER): Payer: Medicare Other | Admitting: Physician Assistant

## 2017-01-10 ENCOUNTER — Encounter (INDEPENDENT_AMBULATORY_CARE_PROVIDER_SITE_OTHER): Payer: Self-pay

## 2017-01-15 IMAGING — RF DG UGI W/ GASTROGRAFIN
13 of 24 series · 13 of 24 positions shown · IV contrast (omnipaque)
Comparison: 07/03/2014

CLINICAL DATA: Postop day 1 status post laparoscopic sleeve
gastrectomy.

EXAM:
WATER SOLUBLE UPPER GI SERIES
TECHNIQUE: Single-column upper GI series was performed using water soluble
contrast.
CONTRAST:  15mL OMNIPAQUE IOHEXOL 300 MG/ML  SOLN

[Series 1: run · 1 of 1 slices shown (1 of 13)]
[im 1/1]
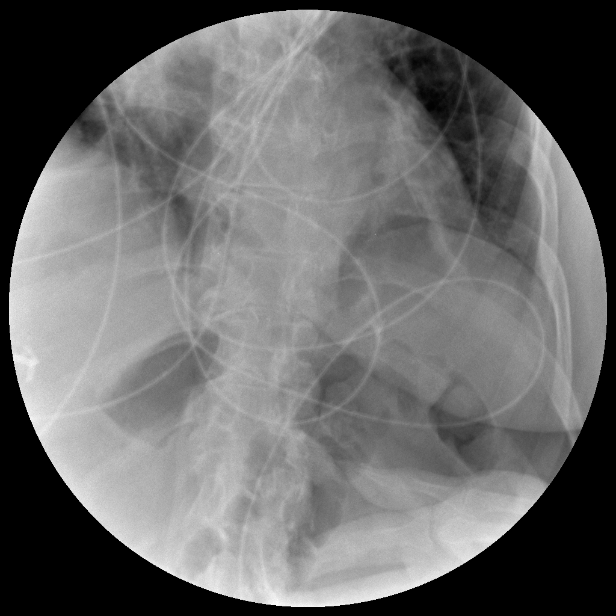

[Series 3: run · 1 of 1 slices shown (2 of 13)]
[im 1/1]
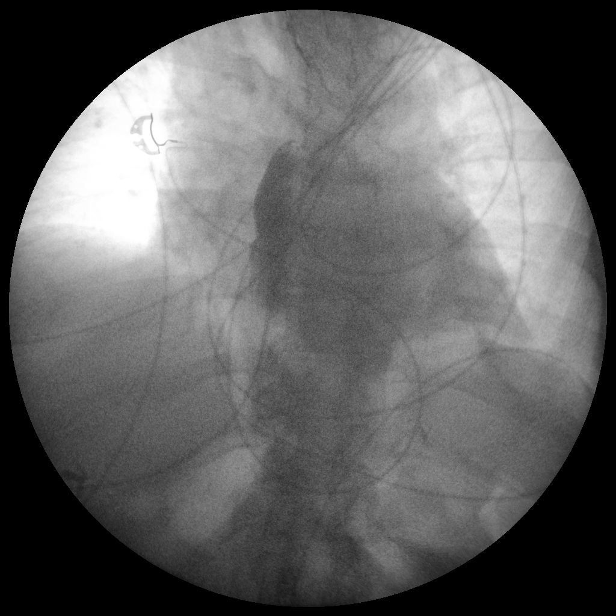

[Series 5: run · 1 of 1 slices shown (3 of 13)]
[im 1/1]
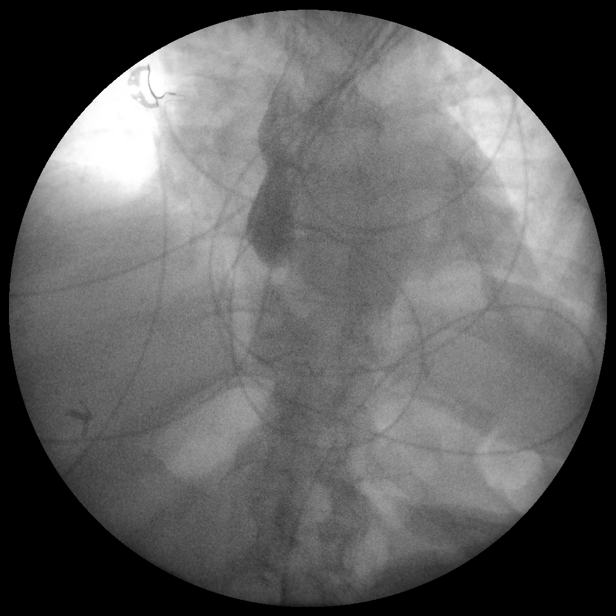

[Series 7: run · 1 of 1 slices shown (4 of 13)]
[im 1/1]
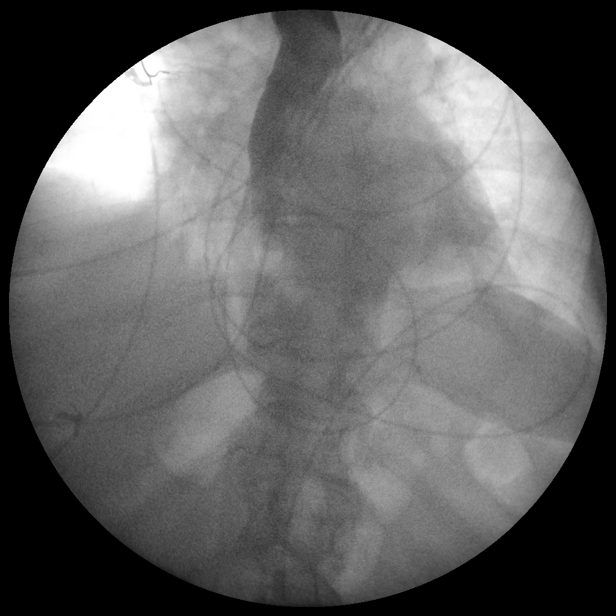

[Series 9: run · 1 of 1 slices shown (5 of 13)]
[im 1/1]
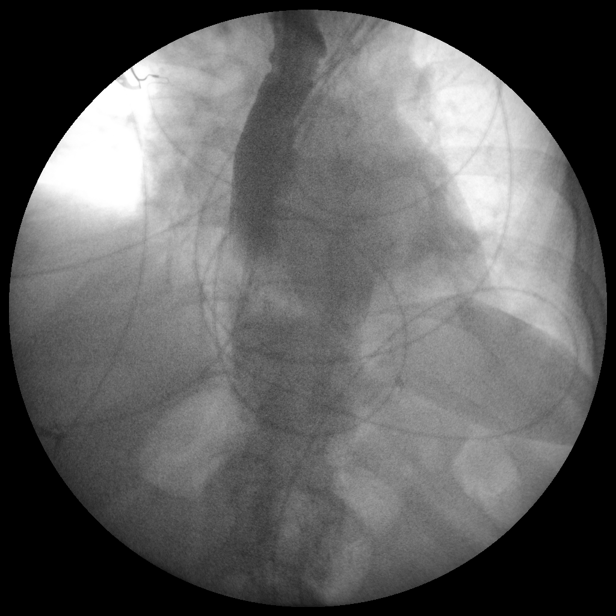

[Series 11: run · 1 of 1 slices shown (6 of 13)]
[im 1/1]
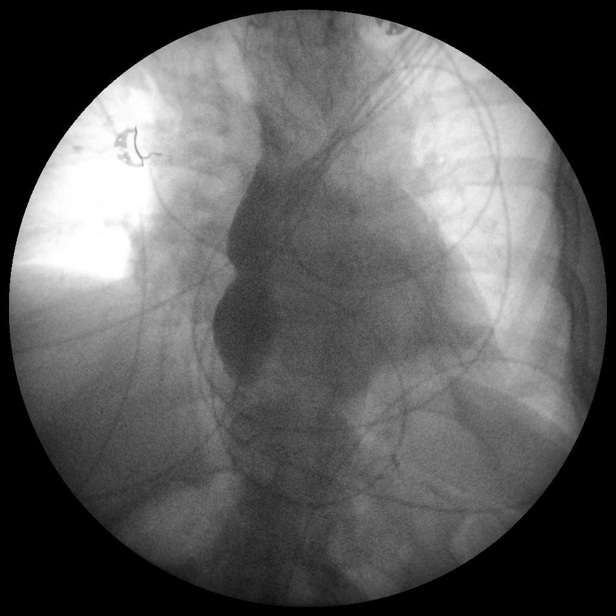

[Series 13: run · 1 of 1 slices shown (7 of 13)]
[im 1/1]
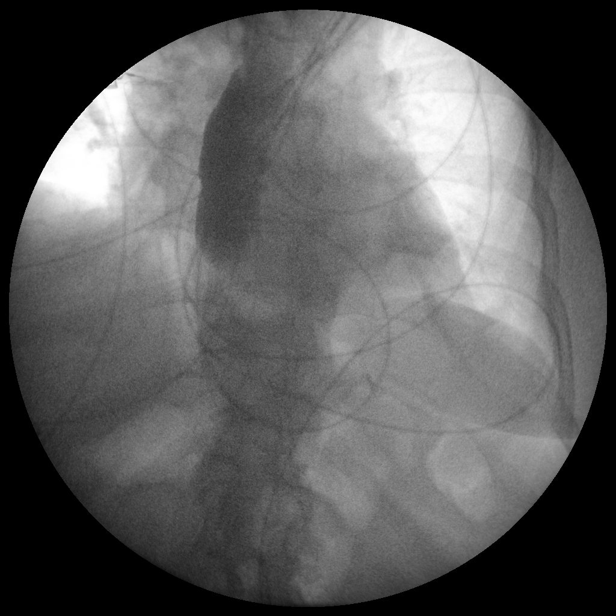

[Series 14: run · 1 of 3 slices shown (8 of 13)]
[im 1/3]
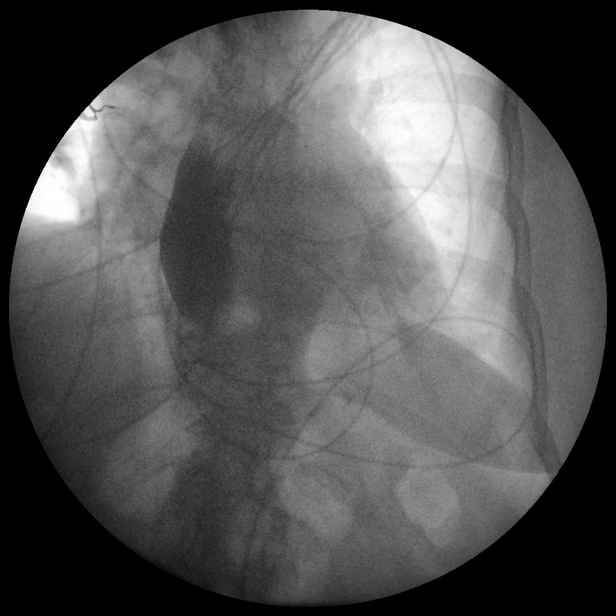

[Series 16: run · 1 of 1 slices shown (9 of 13)]
[im 1/1]
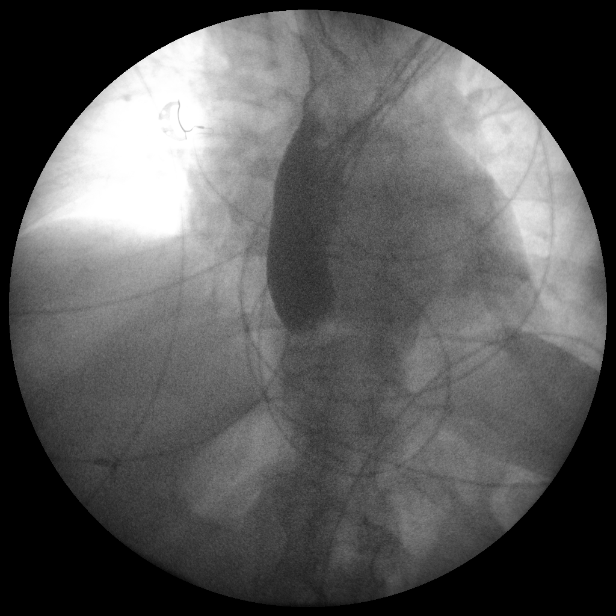

[Series 18: run · 1 of 1 slices shown (10 of 13)]
[im 1/1]
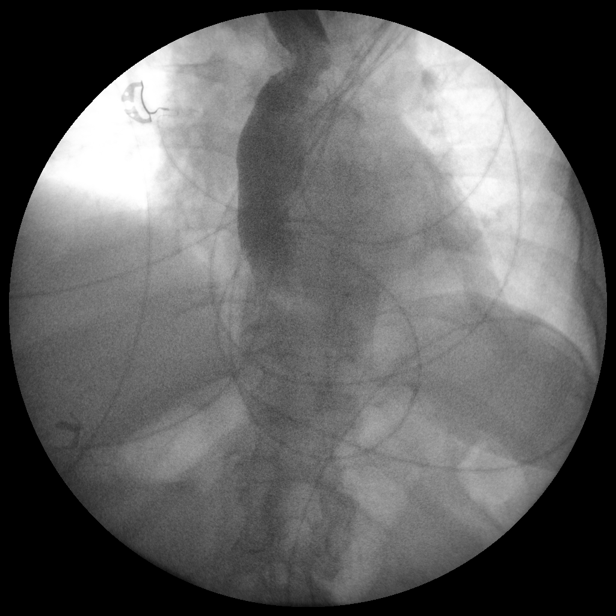

[Series 20: run · 1 of 1 slices shown (11 of 13)]
[im 1/1]
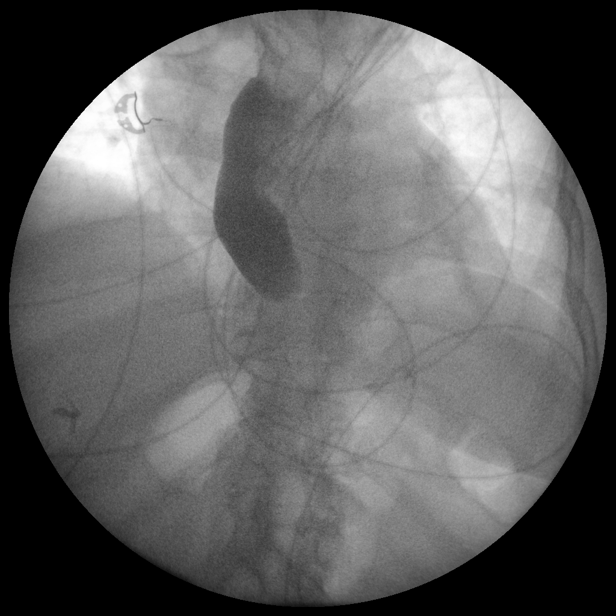

[Series 22: run · 1 of 1 slices shown (12 of 13)]
[im 1/1]
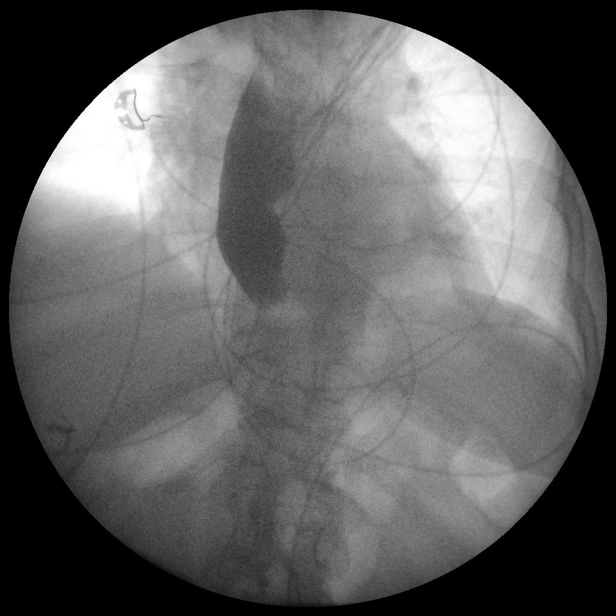

[Series 24: run · 1 of 1 slices shown (13 of 13)]
[im 1/1]
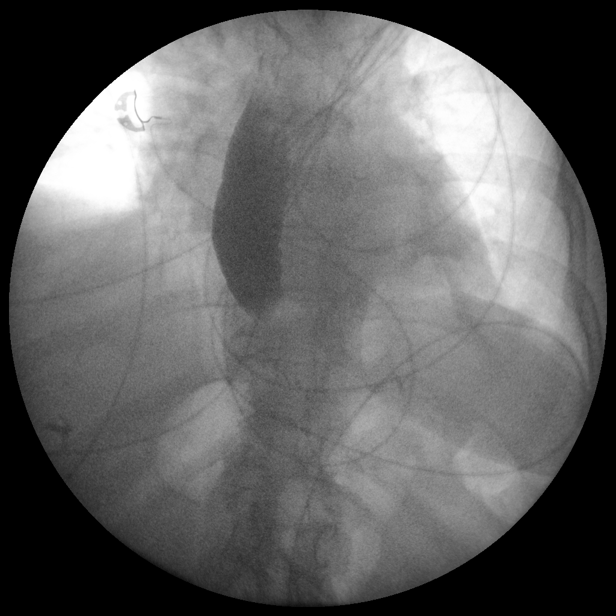

[13 of 24 positions shown; findings below may reference images not displayed]

FLUOROSCOPY TIME:  Radiation Exposure Index (as provided by the
fluoroscopic device):

If the device does not provide the exposure index:

Fluoroscopy Time (in minutes and seconds):  3 minutes, 34 seconds

Number of Acquired Images:  0
FINDINGS: When I entered the room, the patient was crying and moaning. She
complained of discomfort all over her body, but especially in her
hips, knees, and back. She was unable to tolerate having the table
lifted up more than about 20 degrees from horizontal.

Initial scout image demonstrates bandlike opacities at the lung
bases favoring atelectasis.

I administered 50 cc of Omnipaque 300 to the patient using a straw
and holding the cup for her, since she was unable to tolerate
upright positioning or controlling the cup.

Over an observation time of greater than 11 minutes, the contrast
was noted to extend into the distal esophagus but never into the
stomach. I tried various maneuvers such as turning the patient into
the RPO position, and tilting her out as much as she could tolerate
(to about 20 degrees from the horizontal), but there was stasis of
contrast in the distal esophagus with occasional tertiary
contractions and to and fro motion of the contrast in the esophagus.
Given the prolonged observation and desire to minimize radiation
exposure, as well as the patient's active complaints and demand that
the procedure be terminated, the exam was ended at this point prior
to filling of the stomach.
IMPRESSION: 1. Stasis of contrast medium in the esophagus without passage to the
stomach despite prolonged observation. The patient was complaining
of pain in her back, knees, and hips throughout the exam despite
supine positioning, as well as crying throughout the exam. She was
completely unable to tolerate the type of upright positioning which
may have allowed gravity to drain the esophagus to the stomach.
Accordingly, I am uncertain how much of the appearance today might
be due to actual narrowing at the gastroesophageal junction versus
physiologic dysmotility of the esophagus. Clearly, as no contrast
was observed to enter the stomach, we are not able to issue an
opinion as to the presence or absence of gastric leak. Options for
further workup might include CT scan or a repeat study with the
patient under much better pain control common to see if standing the
patient up allows the esophagus to drain.

## 2017-01-19 DIAGNOSIS — H2513 Age-related nuclear cataract, bilateral: Secondary | ICD-10-CM | POA: Diagnosis not present

## 2017-01-19 DIAGNOSIS — H532 Diplopia: Secondary | ICD-10-CM | POA: Diagnosis not present

## 2017-01-19 DIAGNOSIS — H4912 Fourth [trochlear] nerve palsy, left eye: Secondary | ICD-10-CM | POA: Diagnosis not present

## 2017-01-19 DIAGNOSIS — H52203 Unspecified astigmatism, bilateral: Secondary | ICD-10-CM | POA: Diagnosis not present

## 2017-01-25 ENCOUNTER — Telehealth (INDEPENDENT_AMBULATORY_CARE_PROVIDER_SITE_OTHER): Payer: Self-pay

## 2017-01-25 ENCOUNTER — Ambulatory Visit (INDEPENDENT_AMBULATORY_CARE_PROVIDER_SITE_OTHER): Payer: Medicare Other | Admitting: Physician Assistant

## 2017-01-25 VITALS — BP 115/77 | HR 91 | Temp 98.0°F | Ht 63.0 in | Wt 195.0 lb

## 2017-01-25 DIAGNOSIS — Z6834 Body mass index (BMI) 34.0-34.9, adult: Secondary | ICD-10-CM | POA: Diagnosis not present

## 2017-01-25 DIAGNOSIS — E669 Obesity, unspecified: Secondary | ICD-10-CM | POA: Diagnosis not present

## 2017-01-25 DIAGNOSIS — E7849 Other hyperlipidemia: Secondary | ICD-10-CM | POA: Diagnosis not present

## 2017-01-25 NOTE — Telephone Encounter (Signed)
Patient was in for a routine office visit today and met with the office Administrator Cira Servant. Marcie Bal explained in detail that her incoming bills after her visits and testing will be based on what Medicare and [employer] do not pick up. Any co insurance will be the responsibility of the insured party. The patient was made aware that two counseling charges on two separate visits will be recoded and submitted to her insurance company. Patient verbalized understanding.   [name], [place]

## 2017-01-25 NOTE — Progress Notes (Signed)
Office: [phone]  /  Fax: [phone]   HPI:   Chief Complaint: OBESITY [name] is here to discuss her progress with her obesity treatment plan. She is on the keep a food journal with 1300 calories and 75+ grams of protein daily and is following her eating plan approximately 0 % of the time. She states she is exercising 0 minutes 0 times per week. [name] has been sick with nausea and decrease in appetite and has not been able to eat much . She is better today and is motivated to get back on track. She would like more meal planning ideas. Her weight is 195 lb (88.5 kg) today and has had a weight gain of 4 pounds over a period of 8 weeks since her last visit. She has gained 2 lbs since starting treatment with [place].  Hyperlipidemia [name] has hyperlipidemia and has been trying to improve her cholesterol levels with intensive lifestyle modification including a low saturated fat diet, exercise and weight loss. She denies any chest pain, claudication or myalgias.   ALLERGIES: Allergies  Allergen Reactions  . Ativan [Lorazepam] Other (See Comments)    Agitation, aggressive actions, significant mental changes  . Buprenorphine Hcl Itching and Rash  . Fentanyl Itching  . Morphine And Related Itching and Rash    MEDICATIONS: Current Outpatient Medications on File Prior to Visit  Medication Sig Dispense Refill  . acetaminophen (TYLENOL) 500 MG tablet Take 1,000 mg by mouth every 6 (six) hours as needed (for pain.).     [name] albuterol (PROVENTIL HFA;VENTOLIN HFA) 108 (90 Base) MCG/ACT inhaler Inhale 1-2 puffs into the lungs every 6 (six) hours as needed for shortness of breath or wheezing.    . Biotin 10000 MCG TABS Take 10,000 mcg by mouth at bedtime.     . cetirizine (ZYRTEC) 10 MG chewable tablet Chew 10 mg by mouth daily.    . cholecalciferol (VITAMIN D) 1000 units tablet Take 1 tablet (1,000 Units total) by mouth 2 (two) times daily. 60 tablet 0  . clorazepate (TRANXENE) 7.5 MG tablet  Take 7.5 mg by mouth 3 (three) times daily as needed for anxiety.    [name] diltiazem (CARTIA XT) 180 MG 24 hr capsule Take 1 capsule (180 mg total) by mouth daily. 30 capsule 11  . DULoxetine (CYMBALTA) 60 MG capsule Take 60 mg by mouth daily with supper.     . Multiple Vitamin (MULTIVITAMIN WITH MINERALS) TABS tablet Take 1 tablet by mouth 2 (two) times daily. Centrum    . pantoprazole (PROTONIX) 40 MG tablet Take 1 tablet (40 mg total) by mouth every morning. 90 tablet 3  . Probiotic Product (RESTORA) CAPS Take 1 capsule by mouth daily. (Patient taking differently: Take 1 capsule by mouth daily with supper. ) 30 capsule 2  . simvastatin (ZOCOR) 20 MG tablet Take 1 tablet (20 mg total) by mouth daily. 30 tablet 11  . sucralfate (CARAFATE) 1 g tablet Take 1 tablet (1 g total) by mouth 4 (four) times daily -  with meals and at bedtime. May dissolve tablet in 1 tablespoon of water to take as a slurry 120 tablet 1  . traMADol (ULTRAM) 50 MG tablet Take 50 mg by mouth every 6 (six) hours as needed (for pain.).     [name] vitamin B-12 (CYANOCOBALAMIN) 1000 MCG tablet Take 1,000 mcg by mouth daily.     No current facility-administered medications on file prior to visit.     PAST MEDICAL HISTORY: Past Medical History:  Diagnosis Date  . Anemia   . Antral polyp    benign  . Anxiety   . Arthritis   . Asthma   . B12 deficiency   . Back pain   . Chest pain   . Constipation   . Depression   . Diverticulosis   . Gallbladder problem   . GERD (gastroesophageal reflux disease) 09/09/2011  . History of transfusion   . HTN (hypertension) 09/09/2011  . Hyperlipemia 09/09/2011  . Hypertension   . IBS (irritable bowel syndrome)   . Internal hemorrhoid   . Joint pain   . Obesity   . Osteoarthritis   . Paroxysmal atrial fibrillation (HCC)   . PONV (postoperative nausea and vomiting)    with big surgeries, none with endos etc, with colonoscopy- 5-6 years ago could not swallow   . Shortness of breath   .  Sleep apnea    borderline per patient no cpap   . SOB (shortness of breath) on exertion   . Swallowing difficulty   . Tachycardia   . Unstable angina ([place]) 09/09/2011  . Vitamin D deficiency   . Vocal cord dysfunction 2013   swelling    PAST SURGICAL HISTORY: Past Surgical History:  Procedure Laterality Date  . ABDOMINAL HYSTERECTOMY  1988  . ANKLE FUSION Right    x 2  . BACK SURGERY  [phone]   3 ruptured discs, lower back  . CARDIAC CATHETERIZATION  2013   NORMAL  . CHOLECYSTECTOMY    . ELBOW SURGERY Left    removed bone chip  . FOOT OSTEOTOMY W/ PLANTAR FASCIA RELEASE Left   . ILIOTIBIAL BAND RELEASE Right 1997  . JOINT REPLACEMENT    . NASAL SINUS SURGERY  1997  . SHOULDER SURGERY Left   . tfc wrist Left 1997  . thumb replacement Bilateral    one 2009 and one 2014  . TOTAL ANKLE REPLACEMENT Right    x 3  . TOTAL ANKLE REPLACEMENT Left   . TOTAL ANKLE REPLACEMENT    . TOTAL HIP ARTHROPLASTY Bilateral   . TOTAL KNEE ARTHROPLASTY Bilateral    left 2002, right 2012  . TOTAL SHOULDER ARTHROPLASTY Right 05/14/2016  . TRANSTHORACIC ECHOCARDIOGRAM  09/09/2011   MILD CONCENTRIC LVH. TRACE MITRAL REGURG. TRACE TR. MILD AORTIC REGURG.  . 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 WRIST SURGERY Left 1997, 2009     SOCIAL HISTORY: Social History   Tobacco Use  . Smoking status: Never Smoker  . Smokeless tobacco: Never Used  Substance Use Topics  . Alcohol use: No  . Drug use: No    FAMILY HISTORY: Family History  Problem Relation Age of Onset  . Hypertension Mother   . Diabetes Mother   . Arthritis Mother   . Esophageal cancer Mother   . Cancer Mother   . Esophageal cancer Father 94  . Cancer Father   . Alcoholism Father   . Other Sister        kidney mass removed  . Hypertension Sister   . Heart disease Maternal Grandmother   . Stroke Paternal Grandmother   . Coronary artery disease Neg Hx   . Colon cancer Neg Hx   . Liver disease Neg Hx   .  Kidney disease Neg Hx     ROS: Review of Systems  Constitutional: Negative for weight loss.  Cardiovascular: Negative for chest pain and claudication.  Musculoskeletal: Negative for myalgias.    PHYSICAL EXAM: Blood  pressure 115/77, pulse 91, temperature 98 F (36.7 C), height 5\' 3"  (1.6 m), weight 195 lb (88.5 kg), SpO2 99 %. Body mass index is 34.54 kg/m. Physical Exam  Constitutional: She is oriented to person, place, and time. She appears well-developed and well-nourished.  Cardiovascular: Normal rate.  Pulmonary/Chest: Effort normal.  Musculoskeletal: Normal range of motion.  Neurological: She is oriented to person, place, and time.  Skin: Skin is warm and dry.  Psychiatric: She has a normal mood and affect. Her behavior is normal.  Vitals reviewed.   RECENT LABS AND TESTS: BMET    Component Value Date/Time   NA 144 10/08/2016 1147   K 4.6 10/08/2016 1147   CL 107 (H) 10/08/2016 1147   CO2 24 10/08/2016 1147   GLUCOSE 93 10/08/2016 1147   GLUCOSE 143 (H) 05/15/2016 0312   BUN 21 10/08/2016 1147   CREATININE 0.70 10/08/2016 1147   CALCIUM 9.2 10/08/2016 1147   GFRNONAA 88 10/08/2016 1147   GFRAA 102 10/08/2016 1147   Lab Results  Component Value Date   HGBA1C 5.4 10/08/2016   HGBA1C 5.8 (H) 10/13/2014   Lab Results  Component Value Date   INSULIN 9.8 10/08/2016   CBC    Component Value Date/Time   WBC 4.7 09/30/2016   WBC 5.2 05/10/2016 1304   RBC 3.91 05/10/2016 1304   HGB 10.7 (L) 05/15/2016 0312   HCT 33.2 (L) 05/15/2016 0312   PLT 202 05/10/2016 1304   MCV 97.4 05/10/2016 1304   MCH 31.7 05/10/2016 1304   MCHC 32.5 05/10/2016 1304   RDW 12.8 05/10/2016 1304   LYMPHSABS 2.0 04/21/2016 1226   MONOABS 0.5 04/21/2016 1226   EOSABS 0.1 04/21/2016 1226   BASOSABS 0.0 04/21/2016 1226   Iron/TIBC/Ferritin/ %Sat No results found for: IRON, TIBC, FERRITIN, IRONPCTSAT Lipid Panel  No results found for: CHOL, TRIG, HDL, CHOLHDL, VLDL, LDLCALC,  LDLDIRECT Hepatic Function Panel     Component Value Date/Time   PROT 5.8 (L) 10/08/2016 1147   ALBUMIN 3.8 10/08/2016 1147   AST 24 10/08/2016 1147   ALT 15 10/08/2016 1147   ALKPHOS 81 10/08/2016 1147   BILITOT 0.4 10/08/2016 1147      Component Value Date/Time   TSH 1.60 09/30/2016   TSH 0.771 10/13/2014 1700   TSH 1.37 08/03/2013 1421    ASSESSMENT AND PLAN: Other hyperlipidemia  Class 1 obesity with serious comorbidity and body mass index (BMI) of 34.0 to 34.9 in adult, unspecified obesity type  PLAN:  Hyperlipidemia [name] was informed of the American Heart Association Guidelines emphasizing intensive lifestyle modifications as the first line treatment for hyperlipidemia. We discussed many lifestyle modifications today in depth, and [name] will continue to work on decreasing saturated fats such as fatty red meat, butter and many fried foods. She will also increase vegetables and lean protein in her diet and continue to work on exercise and weight loss efforts.  We spent > than 50% of the 15 minute visit on the counseling as documented in the note.  Obesity [name] is currently in the action stage of change. As such, her goal is to continue with weight loss efforts She has agreed to keep a food journal with 1300 calories and 70 grams of protein daily [name] has been instructed to work up to a goal of 150 minutes of combined cardio and strengthening exercise per week for weight loss and overall health benefits. We discussed the following Behavioral Modification Strategies today: increasing lean protein intake and work  on meal planning and easy cooking plans  [name] has agreed to follow up with our clinic in 2 weeks. She was informed of the importance of frequent follow up visits to maximize her success with intensive lifestyle modifications for her multiple health conditions.  I, Doreene Nest, am acting as transcriptionist for Lacy Duverney, PA-C  I have reviewed the  above documentation for accuracy and completeness, and I agree with the above. -Lacy Duverney, PA-C  I have reviewed the above note and agree with the plan. -Dennard Nip, MD   OBESITY BEHAVIORAL INTERVENTION VISIT  Today's visit was # 4 out of 22.  Starting weight: 193 lbs Starting date: 10/08/16 Today's weight : 195 lbs Today's date: 01/25/2017 Total lbs lost to date: 0 (Patients must lose 7 lbs in the first 6 months to continue with counseling)   ASK: We discussed the diagnosis of obesity with [name] today and [name] agreed to give [place] permission to discuss obesity behavioral modification therapy today.  ASSESS: [name] has the diagnosis of obesity and her BMI today is 34.55 [name] is in the action stage of change   ADVISE: [name] was educated on the multiple health risks of obesity as well as the benefit of weight loss to improve her health. She was advised of the need for long term treatment and the importance of lifestyle modifications.  AGREE: Multiple dietary modification options and treatment options were discussed and  [name] agreed to keep a food journal with 1300 calories and 70 grams of protein daily We discussed the following Behavioral Modification Strategies today: increasing lean protein intake and work on meal planning and easy cooking plans

## 2017-02-15 ENCOUNTER — Ambulatory Visit (INDEPENDENT_AMBULATORY_CARE_PROVIDER_SITE_OTHER): Payer: Medicare Other | Admitting: Physician Assistant

## 2017-02-15 VITALS — BP 100/65 | HR 91 | Temp 97.9°F | Ht 63.0 in | Wt 194.0 lb

## 2017-02-15 DIAGNOSIS — E559 Vitamin D deficiency, unspecified: Secondary | ICD-10-CM

## 2017-02-15 DIAGNOSIS — Z6834 Body mass index (BMI) 34.0-34.9, adult: Secondary | ICD-10-CM | POA: Diagnosis not present

## 2017-02-15 DIAGNOSIS — E8881 Metabolic syndrome: Secondary | ICD-10-CM | POA: Diagnosis not present

## 2017-02-15 DIAGNOSIS — E7849 Other hyperlipidemia: Secondary | ICD-10-CM

## 2017-02-15 DIAGNOSIS — E669 Obesity, unspecified: Secondary | ICD-10-CM | POA: Diagnosis not present

## 2017-02-15 DIAGNOSIS — D649 Anemia, unspecified: Secondary | ICD-10-CM

## 2017-02-15 DIAGNOSIS — R7989 Other specified abnormal findings of blood chemistry: Secondary | ICD-10-CM | POA: Insufficient documentation

## 2017-02-15 MED ORDER — METFORMIN HCL 500 MG PO TABS: 500.0000 mg | ORAL_TABLET | Freq: Every day | ORAL | 0 refills | Status: DC

## 2017-02-15 NOTE — Progress Notes (Signed)
Office: [phone]  /  Fax: [phone]   HPI:   Chief Complaint: OBESITY [name] is here to discuss her progress with her obesity treatment plan. She is on the keep a food journal with 1300 calories and 70 grams of protein daily and is following her eating plan approximately 50 % of the time. She states she is exercising 0 minutes 0 times per week. [name] continues to do well with weight loss. She is mindful of her eating and would like more meal planning ideas. Also she noticed increase hunger and cravings approximately in the afternoon.  Her weight is 194 lb (88 kg) today and has had a weight loss of 1 pound over a period of 3 weeks since her last visit. She has lost 0 lbs since starting treatment with [place].  Vitamin D deficiency [name] has a diagnosis of vitamin D deficiency. She is currently taking prescription Vit D and denies nausea, vomiting or muscle weakness.  Insulin Resistance [name] has a diagnosis of insulin resistance based on her elevated fasting insulin level >5. Although [name]'s blood glucose readings are still under good control, insulin resistance puts her at greater risk of metabolic syndrome and diabetes. She is not taking metformin currently and continues to work on diet and exercise to decrease risk of diabetes. She notes polyphagia.  Normocytic Anemia [name] has a diagnosis of normocytic anemia. She denies history of blood loss or chest pain and dyspnea.   Hyperlipidemia [name] has hyperlipidemia and has been trying to improve her cholesterol levels with intensive lifestyle modification including a low saturated fat diet, exercise and weight loss. She is on simvastatin and she denies any chest pain, claudication or myalgias.  Elevated Vitamin B12 [name] has a diagnosis elevated vitamin B12. She is on B12 supplements post bariatric surgery.   ALLERGIES: Allergies  Allergen Reactions  . Ativan [Lorazepam] Other (See Comments)    Agitation, aggressive  actions, significant mental changes  . Buprenorphine Hcl Itching and Rash  . Fentanyl Itching  . Morphine And Related Itching and Rash    MEDICATIONS: Current Outpatient Medications on File Prior to Visit  Medication Sig Dispense Refill  . acetaminophen (TYLENOL) 500 MG tablet Take 1,000 mg by mouth every 6 (six) hours as needed (for pain.).     [name] albuterol (PROVENTIL HFA;VENTOLIN HFA) 108 (90 Base) MCG/ACT inhaler Inhale 1-2 puffs into the lungs every 6 (six) hours as needed for shortness of breath or wheezing.    . Biotin 10000 MCG TABS Take 10,000 mcg by mouth at bedtime.     . cetirizine (ZYRTEC) 10 MG chewable tablet Chew 10 mg by mouth daily.    . cholecalciferol (VITAMIN D) 1000 units tablet Take 1 tablet (1,000 Units total) by mouth 2 (two) times daily. 60 tablet 0  . clorazepate (TRANXENE) 7.5 MG tablet Take 7.5 mg by mouth 3 (three) times daily as needed for anxiety.    [name] diltiazem (CARTIA XT) 180 MG 24 hr capsule Take 1 capsule (180 mg total) by mouth daily. 30 capsule 11  . DULoxetine (CYMBALTA) 60 MG capsule Take 60 mg by mouth daily with supper.     . Multiple Vitamin (MULTIVITAMIN WITH MINERALS) TABS tablet Take 1 tablet by mouth 2 (two) times daily. Centrum    . pantoprazole (PROTONIX) 40 MG tablet Take 1 tablet (40 mg total) by mouth every morning. 90 tablet 3  . Probiotic Product (RESTORA) CAPS Take 1 capsule by mouth daily. (Patient taking differently: Take 1 capsule by mouth  daily with supper. ) 30 capsule 2  . simvastatin (ZOCOR) 20 MG tablet Take 1 tablet (20 mg total) by mouth daily. 30 tablet 11  . sucralfate (CARAFATE) 1 g tablet Take 1 tablet (1 g total) by mouth 4 (four) times daily -  with meals and at bedtime. May dissolve tablet in 1 tablespoon of water to take as a slurry 120 tablet 1  . traMADol (ULTRAM) 50 MG tablet Take 50 mg by mouth every 6 (six) hours as needed (for pain.).     [name] vitamin B-12 (CYANOCOBALAMIN) 1000 MCG tablet Take 1,000 mcg by mouth daily.      No current facility-administered medications on file prior to visit.     PAST MEDICAL HISTORY: Past Medical History:  Diagnosis Date  . Anemia   . Antral polyp    benign  . Anxiety   . Arthritis   . Asthma   . B12 deficiency   . Back pain   . Chest pain   . Constipation   . Depression   . Diverticulosis   . Gallbladder problem   . GERD (gastroesophageal reflux disease) 09/09/2011  . History of transfusion   . HTN (hypertension) 09/09/2011  . Hyperlipemia 09/09/2011  . Hypertension   . IBS (irritable bowel syndrome)   . Internal hemorrhoid   . Joint pain   . Obesity   . Osteoarthritis   . Paroxysmal atrial fibrillation (HCC)   . PONV (postoperative nausea and vomiting)    with big surgeries, none with endos etc, with colonoscopy- 5-6 years ago could not swallow   . Shortness of breath   . Sleep apnea    borderline per patient no cpap   . SOB (shortness of breath) on exertion   . Swallowing difficulty   . Tachycardia   . Unstable angina ([place]) 09/09/2011  . Vitamin D deficiency   . Vocal cord dysfunction 2013   swelling    PAST SURGICAL HISTORY: Past Surgical History:  Procedure Laterality Date  . ABDOMINAL HYSTERECTOMY  1988  . ANKLE FUSION Right    x 2  . BACK SURGERY  [id number]   3 ruptured discs, lower back  . BILATERAL ANTERIOR TOTAL HIP ARTHROPLASTY Bilateral 11/12/2014   Procedure: BILATERAL ANTERIOR TOTAL HIP ARTHROPLASTY;  Surgeon: Paralee Cancel, MD;  Location: [facility];  Service: Orthopedics;  Laterality: Bilateral;  . BRAVO [address] STUDY [name] 03/06/2013   Procedure: BRAVO [place] STUDY;  Surgeon: Inda Castle, MD;  Location: [facility];  Service: Endoscopy;  Laterality: [name];  . CARDIAC CATHETERIZATION  2013   NORMAL  . CHOLECYSTECTOMY    . ELBOW SURGERY Left    removed bone chip  . ESOPHAGOGASTRODUODENOSCOPY [name] 03/06/2013   Procedure: ESOPHAGOGASTRODUODENOSCOPY (EGD);  Surgeon: Inda Castle, MD;  Location: [facility];  Service: Endoscopy;   Laterality: [name];  . FOOT OSTEOTOMY W/ PLANTAR FASCIA RELEASE Left   . ILIOTIBIAL BAND RELEASE Right 1997  . JOINT REPLACEMENT    . LAPAROSCOPIC GASTRIC SLEEVE RESECTION WITH HIATAL HERNIA REPAIR  08/20/2014   Procedure: LAPAROSCOPIC GASTRIC SLEEVE RESECTION WITH HIATAL HERNIA  REPAIR AND  UPPER ENDOSCOPY;  Surgeon: Johnathan Hausen, MD;  Location: [facility];  Service: General;;  . LEFT HEART CATHETERIZATION WITH CORONARY ANGIOGRAM Bilateral 09/10/2011   Procedure: LEFT HEART CATHETERIZATION WITH CORONARY ANGIOGRAM;  Surgeon: Lorretta Harp, MD;  Location: [facility] CATH LAB;  Service: Cardiovascular;  Laterality: Bilateral;  . NASAL SINUS SURGERY  1997  . REVERSE SHOULDER ARTHROPLASTY Right 05/14/2016  Procedure: RIGHT REVERSE SHOULDER ARTHROPLASTY;  Surgeon: Netta Cedars, MD;  Location: [facility];  Service: Orthopedics;  Laterality: Right;  . SHOULDER SURGERY Left   . tfc wrist Left 1997  . thumb replacement Bilateral    one 2009 and one 2014  . TOTAL ANKLE REPLACEMENT Right    x 3  . TOTAL ANKLE REPLACEMENT Left   . TOTAL ANKLE REPLACEMENT    . TOTAL HIP ARTHROPLASTY Bilateral   . TOTAL KNEE ARTHROPLASTY Bilateral    left 2002, right 2012  . TOTAL SHOULDER ARTHROPLASTY Right 05/14/2016  . TRANSTHORACIC ECHOCARDIOGRAM  09/09/2011   MILD CONCENTRIC LVH. TRACE MITRAL REGURG. TRACE TR. MILD AORTIC REGURG.  . 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 UPPER GI ENDOSCOPY  08/20/2014   Procedure: UPPER GI ENDOSCOPY;  Surgeon: Johnathan Hausen, MD;  Location: [facility];  Service: General;;  . WRIST SURGERY Left 1997, 2009     SOCIAL HISTORY: Social History   Tobacco Use  . Smoking status: Never Smoker  . Smokeless tobacco: Never Used  Substance Use Topics  . Alcohol use: No  . Drug use: No    FAMILY HISTORY: Family History  Problem Relation Age of Onset  . Hypertension Mother   . Diabetes Mother   . Arthritis Mother   . Esophageal cancer Mother   . Cancer Mother   . Esophageal cancer Father  21  . Cancer Father   . Alcoholism Father   . Other Sister        kidney mass removed  . Hypertension Sister   . Heart disease Maternal Grandmother   . Stroke Paternal Grandmother   . Coronary artery disease Neg Hx   . Colon cancer Neg Hx   . Liver disease Neg Hx   . Kidney disease Neg Hx     ROS: Review of Systems  Constitutional: Positive for weight loss.  Respiratory: Negative for shortness of breath.   Cardiovascular: Negative for chest pain and claudication.  Gastrointestinal: Negative for nausea and vomiting.  Musculoskeletal: Negative for myalgias.       Negative muscle weakness  Endo/Heme/Allergies:       Positive polyphagia    PHYSICAL EXAM: Blood pressure 100/65, pulse 91, temperature 97.9 F (36.6 C), height 5\' 3"  (1.6 m), weight 194 lb (88 kg), SpO2 100 %. Body mass index is 34.37 kg/m. Physical Exam  Constitutional: She is oriented to person, place, and time. She appears well-developed and well-nourished.  Cardiovascular: Normal rate.  Pulmonary/Chest: Effort normal.  Musculoskeletal: Normal range of motion.  Neurological: She is oriented to person, place, and time.  Skin: Skin is warm and dry.  Psychiatric: She has a normal mood and affect. Her behavior is normal.  Vitals reviewed.   RECENT LABS AND TESTS: BMET    Component Value Date/Time   NA 144 10/08/2016 1147   K 4.6 10/08/2016 1147   CL 107 (H) 10/08/2016 1147   CO2 24 10/08/2016 1147   GLUCOSE 93 10/08/2016 1147   GLUCOSE 143 (H) 05/15/2016 0312   BUN 21 10/08/2016 1147   CREATININE 0.70 10/08/2016 1147   CALCIUM 9.2 10/08/2016 1147   GFRNONAA 88 10/08/2016 1147   GFRAA 102 10/08/2016 1147   Lab Results  Component Value Date   HGBA1C 5.4 10/08/2016   HGBA1C 5.8 (H) 10/13/2014   Lab Results  Component Value Date   INSULIN 9.8 10/08/2016   CBC    Component Value Date/Time   WBC 4.7 09/30/2016  WBC 5.2 05/10/2016 1304   RBC 3.91 05/10/2016 1304   HGB 10.7 (L) 05/15/2016 0312     HCT 33.2 (L) 05/15/2016 0312   PLT 202 05/10/2016 1304   MCV 97.4 05/10/2016 1304   MCH 31.7 05/10/2016 1304   MCHC 32.5 05/10/2016 1304   RDW 12.8 05/10/2016 1304   LYMPHSABS 2.0 04/21/2016 1226   MONOABS 0.5 04/21/2016 1226   EOSABS 0.1 04/21/2016 1226   BASOSABS 0.0 04/21/2016 1226   Iron/TIBC/Ferritin/ %Sat No results found for: IRON, TIBC, FERRITIN, IRONPCTSAT Lipid Panel  No results found for: CHOL, TRIG, HDL, CHOLHDL, VLDL, LDLCALC, LDLDIRECT Hepatic Function Panel     Component Value Date/Time   PROT 5.8 (L) 10/08/2016 1147   ALBUMIN 3.8 10/08/2016 1147   AST 24 10/08/2016 1147   ALT 15 10/08/2016 1147   ALKPHOS 81 10/08/2016 1147   BILITOT 0.4 10/08/2016 1147      Component Value Date/Time   TSH 1.60 09/30/2016   TSH 0.771 10/13/2014 1700   TSH 1.37 08/03/2013 1421    ASSESSMENT AND PLAN: Vitamin D deficiency - Plan: VITAMIN D 25 Hydroxy (Vit-D Deficiency, Fractures)  Insulin resistance - Plan: Comprehensive metabolic panel, Insulin, random, metFORMIN (GLUCOPHAGE) 500 MG tablet  Other hyperlipidemia - Plan: Lipid Panel With LDL/HDL Ratio  Normocytic anemia - Plan: CBC With Differential  Class 1 obesity with serious comorbidity and body mass index (BMI) of 34.0 to 34.9 in adult, unspecified obesity type  PLAN:  Vitamin D Deficiency Lera was informed that low vitamin D levels contributes to fatigue and are associated with obesity, breast, and colon cancer. Cheyla agrees to continue taking prescription Vit D @1 ,000 IU BID and she will follow up for routine testing of vitamin D, at least 2-3 times per year. She was informed of the risk of over-replacement of vitamin D and agrees to not increase her dose unless he discusses this with [place] first. Ariannie agrees to follow up with our clinic in 2 weeks.  Insulin Resistance Graciana will continue to work on weight loss, exercise, and decreasing simple carbohydrates in her diet to help decrease the risk of  diabetes. We dicussed metformin including benefits and risks. She was informed that eating too many simple carbohydrates or too many calories at one sitting increases the likelihood of GI side effects. Salaya agrees to start metformin 500 mg qd #30 with no refills. Graceyn agrees to follow up with our clinic in 2 weeks as directed to monitor her progress.  Normocytic Anemia The diagnosis of normocytic anemia was discussed with [name] and was explained in detail. Iron supplement was not prescribed and we will check CBC. Iyesha agrees to follow up with our clinic in 2 weeks.  Hyperlipidemia Geni was informed of the American Heart Association Guidelines emphasizing intensive lifestyle modifications as the first line treatment for hyperlipidemia. We discussed many lifestyle modifications today in depth, and Denyla will continue to work on decreasing saturated fats such as fatty red meat, butter and many fried foods. She will also increase vegetables and lean protein in her diet and continue to work on exercise and weight loss efforts. Astryd agrees to continue taking her medications as prescribed. We will check labs and she agrees to follow up with our clinic in 2 weeks.  Elevated Vitamin B12 Addalyne will work on decreasing B12 rich foods in her diet. B12 supplementation was not prescribed today. We will check labs and Nolita agrees to follow up with our clinic in 2 weeks.  Obesity [name]  is currently in the action stage of change. As such, her goal is to continue with weight loss efforts She has agreed to keep a food journal with 1300 calories and 90 grams of protein daily [name] has been instructed to work up to a goal of 150 minutes of combined cardio and strengthening exercise per week for weight loss and overall health benefits. We discussed the following Behavioral Modification Strategies today: increasing lean protein intake and better snacking choices   [name] has agreed to follow  up with our clinic in 2 weeks. She was informed of the importance of frequent follow up visits to maximize her success with intensive lifestyle modifications for her multiple health conditions.  [name], [name], am acting as transcriptionist for Theresa Duverney, PA-C  [name] have reviewed the above documentation for accuracy and completeness, and [name] agree with the above. -Theresa Duverney, PA-C  [name] have reviewed the above note and agree with the plan. -Theresa Nip, MD     Today's visit was # 5 out of 22.  Starting weight: 193 lbs Starting date: 10/08/16 Today's weight : 194 lbs  Today's date: 02/15/2017 Total lbs lost to date: 0 (Patients must lose 7 lbs in the first 6 months to continue with counseling)   ASK: We discussed the diagnosis of obesity with [name] today and [name] agreed to give [place] permission to discuss obesity behavioral modification therapy today.  ASSESS: [name] has the diagnosis of obesity and her BMI today is 34.37 [name] is in the action stage of change   ADVISE: [name] was educated on the multiple health risks of obesity as well as the benefit of weight loss to improve her health. She was advised of the need for long term treatment and the importance of lifestyle modifications.  AGREE: Multiple dietary modification options and treatment options were discussed and  [name] agreed to keep a food journal with 1300 calories and 90 grams of protein daily We discussed the following Behavioral Modification Strategies today: increasing lean protein intake and better snacking choices

## 2017-02-16 DIAGNOSIS — R42 Dizziness and giddiness: Secondary | ICD-10-CM | POA: Diagnosis not present

## 2017-02-16 DIAGNOSIS — H903 Sensorineural hearing loss, bilateral: Secondary | ICD-10-CM | POA: Diagnosis not present

## 2017-02-16 DIAGNOSIS — I728 Aneurysm of other specified arteries: Secondary | ICD-10-CM | POA: Diagnosis not present

## 2017-02-16 DIAGNOSIS — H8112 Benign paroxysmal vertigo, left ear: Secondary | ICD-10-CM | POA: Diagnosis not present

## 2017-02-16 DIAGNOSIS — H9312 Tinnitus, left ear: Secondary | ICD-10-CM | POA: Diagnosis not present

## 2017-02-16 LAB — CBC WITH DIFFERENTIAL
BASOS: 1 %
Basophils Absolute: 0 10*3/uL (ref 0.0–0.2)
EOS (ABSOLUTE): 0.1 10*3/uL (ref 0.0–0.4)
EOS: 2 %
HEMATOCRIT: 40 % (ref 34.0–46.6)
Hemoglobin: 13 g/dL (ref 11.1–15.9)
IMMATURE GRANS (ABS): 0 10*3/uL (ref 0.0–0.1)
Immature Granulocytes: 0 %
Lymphocytes Absolute: 1.4 10*3/uL (ref 0.7–3.1)
Lymphs: 33 %
MCH: 30.4 pg (ref 26.6–33.0)
MCHC: 32.5 g/dL (ref 31.5–35.7)
MCV: 94 fL (ref 79–97)
MONOS ABS: 0.4 10*3/uL (ref 0.1–0.9)
Monocytes: 8 %
NEUTROS ABS: 2.4 10*3/uL (ref 1.4–7.0)
NEUTROS PCT: 56 %
RBC: 4.28 x10E6/uL (ref 3.77–5.28)
RDW: 13.3 % (ref 12.3–15.4)
WBC: 4.3 10*3/uL (ref 3.4–10.8)

## 2017-02-16 LAB — COMPREHENSIVE METABOLIC PANEL
ALBUMIN: 4.1 g/dL (ref 3.5–4.8)
ALT: 18 IU/L (ref 0–32)
AST: 25 IU/L (ref 0–40)
Albumin/Globulin Ratio: 2.4 — ABNORMAL HIGH (ref 1.2–2.2)
Alkaline Phosphatase: 94 IU/L (ref 39–117)
BUN / CREAT RATIO: 19 (ref 12–28)
BUN: 15 mg/dL (ref 8–27)
Bilirubin Total: 0.3 mg/dL (ref 0.0–1.2)
CALCIUM: 9.3 mg/dL (ref 8.7–10.3)
CO2: 25 mmol/L (ref 20–29)
CREATININE: 0.78 mg/dL (ref 0.57–1.00)
Chloride: 106 mmol/L (ref 96–106)
GFR calc Af Amer: 89 mL/min/{1.73_m2} (ref >59)
GFR, EST NON AFRICAN AMERICAN: 77 mL/min/{1.73_m2} (ref >59)
GLOBULIN, TOTAL: 1.7 g/dL (ref 1.5–4.5)
Glucose: 99 mg/dL (ref 65–99)
Potassium: 5.2 mmol/L (ref 3.5–5.2)
SODIUM: 146 mmol/L — AB (ref 134–144)
TOTAL PROTEIN: 5.8 g/dL — AB (ref 6.0–8.5)

## 2017-02-16 LAB — LIPID PANEL WITH LDL/HDL RATIO
Cholesterol, Total: 204 mg/dL — ABNORMAL HIGH (ref 100–199)
HDL: 84 mg/dL (ref >39)
LDL CALC: 97 mg/dL (ref 0–99)
LDl/HDL Ratio: 1.2 ratio (ref 0.0–3.2)
Triglycerides: 115 mg/dL (ref 0–149)
VLDL Cholesterol Cal: 23 mg/dL (ref 5–40)

## 2017-02-16 LAB — INSULIN, RANDOM: INSULIN: 8.2 u[IU]/mL (ref 2.6–24.9)

## 2017-02-16 LAB — VITAMIN D 25 HYDROXY (VIT D DEFICIENCY, FRACTURES): VIT D 25 HYDROXY: 54.1 ng/mL (ref 30.0–100.0)

## 2017-02-22 DIAGNOSIS — H532 Diplopia: Secondary | ICD-10-CM | POA: Diagnosis not present

## 2017-02-22 DIAGNOSIS — H2513 Age-related nuclear cataract, bilateral: Secondary | ICD-10-CM | POA: Diagnosis not present

## 2017-02-23 DIAGNOSIS — Z9884 Bariatric surgery status: Secondary | ICD-10-CM | POA: Diagnosis not present

## 2017-02-25 DIAGNOSIS — Z9884 Bariatric surgery status: Secondary | ICD-10-CM | POA: Diagnosis not present

## 2017-02-28 ENCOUNTER — Ambulatory Visit (INDEPENDENT_AMBULATORY_CARE_PROVIDER_SITE_OTHER): Payer: Medicare Other | Admitting: Physician Assistant

## 2017-03-01 ENCOUNTER — Encounter (INDEPENDENT_AMBULATORY_CARE_PROVIDER_SITE_OTHER): Payer: Self-pay | Admitting: Physician Assistant

## 2017-03-02 ENCOUNTER — Telehealth: Payer: Self-pay | Admitting: Gastroenterology

## 2017-03-02 NOTE — Telephone Encounter (Signed)
Left message to call back  

## 2017-03-03 ENCOUNTER — Other Ambulatory Visit: Payer: Self-pay

## 2017-03-03 ENCOUNTER — Ambulatory Visit (INDEPENDENT_AMBULATORY_CARE_PROVIDER_SITE_OTHER): Payer: Medicare Other | Admitting: Physician Assistant

## 2017-03-03 DIAGNOSIS — R197 Diarrhea, unspecified: Secondary | ICD-10-CM

## 2017-03-03 DIAGNOSIS — R1011 Right upper quadrant pain: Secondary | ICD-10-CM

## 2017-03-03 DIAGNOSIS — R112 Nausea with vomiting, unspecified: Secondary | ICD-10-CM

## 2017-03-03 NOTE — Telephone Encounter (Signed)
To add on, I would also recommend GI pathogen panel if new diarrhea, exclude infection.

## 2017-03-03 NOTE — Telephone Encounter (Signed)
For the xray, do you want the KUB DG 2 views? I do not see anything listed for acute abdominal series. Thank you.

## 2017-03-03 NOTE — Telephone Encounter (Signed)
Patient called back, s/p gastric sleeve in May 2016. Last 3-4 weeks reports chronic RUQ with severe intermittent pain for last week, s/p cholecystectomy. Vomited 2 times during this time, has also had diarrhea that is soft with mucus for last 4 weeks. Denies fever and blood in stool. Patient noticed that she started having these symptoms when she was put on Metformin for insulin resistance, she stopped taking the Metformin after a few days but symptoms continued. She does have an appointment with [facility] on 12/27.

## 2017-03-03 NOTE — Telephone Encounter (Signed)
She's been seen for RUQ pain earlier this year, CT scan did not show much. If she is having some pain and vomiting, I would recommend acute abdominal series x ray based on prior CT Scan, ensure no obstruction. She should also have CBC and CMET. She should ensure she is taking protonix which was given to her earlier this year. Pending her course she may need EGD. Can you order labs and xray to start? Thanks

## 2017-03-03 NOTE — Telephone Encounter (Signed)
Yes that's fine. thanks

## 2017-03-03 NOTE — Telephone Encounter (Signed)
Spoke to patient about results and recommendations. She will come get labs and xray done tomorrow.

## 2017-03-03 NOTE — Telephone Encounter (Signed)
Called patient back, no answer. Left detailed message for patient to come to our building to get lab work and abdominal xray. I have asked her to call back to confirm she did get this message.

## 2017-03-04 ENCOUNTER — Other Ambulatory Visit (INDEPENDENT_AMBULATORY_CARE_PROVIDER_SITE_OTHER): Payer: Medicare Other

## 2017-03-04 ENCOUNTER — Ambulatory Visit (INDEPENDENT_AMBULATORY_CARE_PROVIDER_SITE_OTHER)
Admission: RE | Admit: 2017-03-04 | Discharge: 2017-03-04 | Disposition: A | Discharge status: Home / Self Care | Payer: Medicare Other | Source: Ambulatory Visit | Attending: Gastroenterology | Admitting: Gastroenterology

## 2017-03-04 DIAGNOSIS — R112 Nausea with vomiting, unspecified: Secondary | ICD-10-CM

## 2017-03-04 DIAGNOSIS — R197 Diarrhea, unspecified: Secondary | ICD-10-CM

## 2017-03-04 DIAGNOSIS — R1011 Right upper quadrant pain: Secondary | ICD-10-CM

## 2017-03-04 LAB — CBC WITH DIFFERENTIAL/PLATELET
BASOS PCT: 0.8 % (ref 0.0–3.0)
Basophils Absolute: 0 10*3/uL (ref 0.0–0.1)
Eosinophils Absolute: 0.1 10*3/uL (ref 0.0–0.7)
Eosinophils Relative: 1.4 % (ref 0.0–5.0)
HCT: 39 % (ref 36.0–46.0)
Hemoglobin: 12.7 g/dL (ref 12.0–15.0)
LYMPHS ABS: 1.4 10*3/uL (ref 0.7–4.0)
LYMPHS PCT: 33.4 % (ref 12.0–46.0)
MCHC: 32.7 g/dL (ref 30.0–36.0)
MCV: 95.3 fl (ref 78.0–100.0)
Monocytes Absolute: 0.3 10*3/uL (ref 0.1–1.0)
Monocytes Relative: 6.3 % (ref 3.0–12.0)
NEUTROS PCT: 58.1 % (ref 43.0–77.0)
Neutro Abs: 2.5 10*3/uL (ref 1.4–7.7)
PLATELETS: 183 10*3/uL (ref 150.0–400.0)
RBC: 4.09 Mil/uL (ref 3.87–5.11)
RDW: 13.1 % (ref 11.5–15.5)
WBC: 4.2 10*3/uL (ref 4.0–10.5)

## 2017-03-04 LAB — COMPREHENSIVE METABOLIC PANEL
ALT: 14 U/L (ref 0–35)
AST: 22 U/L (ref 0–37)
Albumin: 3.8 g/dL (ref 3.5–5.2)
Alkaline Phosphatase: 78 U/L (ref 39–117)
BUN: 18 mg/dL (ref 6–23)
CALCIUM: 8.8 mg/dL (ref 8.4–10.5)
CHLORIDE: 108 meq/L (ref 96–112)
CO2: 30 mEq/L (ref 19–32)
CREATININE: 0.99 mg/dL (ref 0.40–1.20)
GFR: 58.84 mL/min — ABNORMAL LOW (ref >60.00)
Glucose, Bld: 76 mg/dL (ref 70–99)
Potassium: 3.8 mEq/L (ref 3.5–5.1)
SODIUM: 143 meq/L (ref 135–145)
TOTAL PROTEIN: 6 g/dL (ref 6.0–8.3)
Total Bilirubin: 0.5 mg/dL (ref 0.2–1.2)

## 2017-03-09 ENCOUNTER — Encounter (HOSPITAL_COMMUNITY): Payer: Self-pay

## 2017-03-09 IMAGING — DX DG ABDOMEN ACUTE W/ 1V CHEST
4 series · 4 of 4 positions shown · non-contrast
Comparison: PA and lateral chest 09/29/2014. CT abdomen 11/24/2012.

CLINICAL DATA: Altered mental status for 3 days which worsened last
night. Initial encounter.

EXAM:
DG ABDOMEN ACUTE W/ 1V CHEST

[abdomen erect]
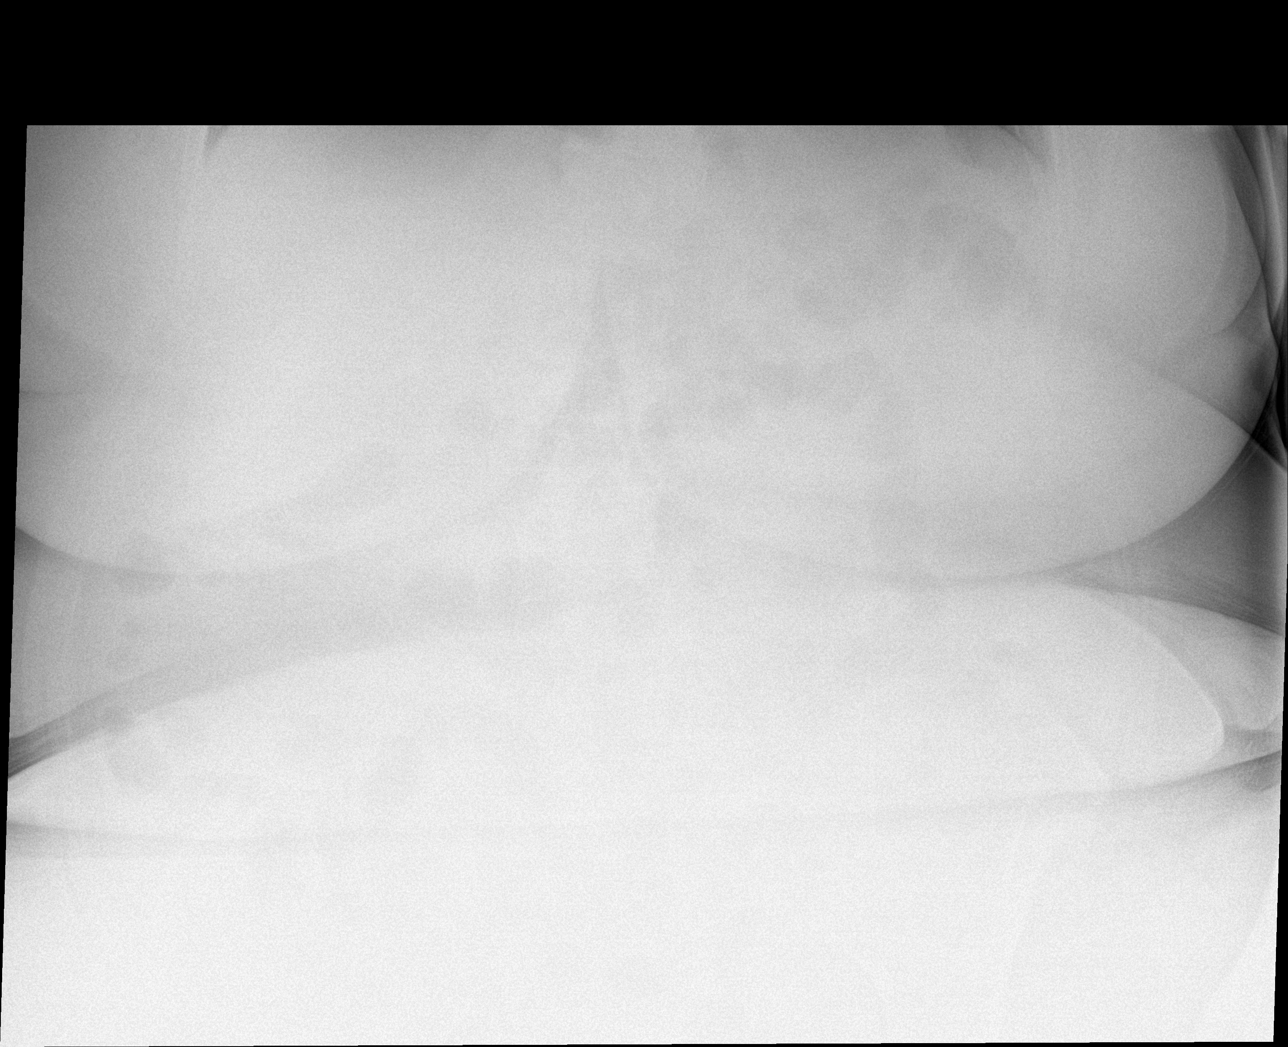

[abdomen supine (1 of 2)]
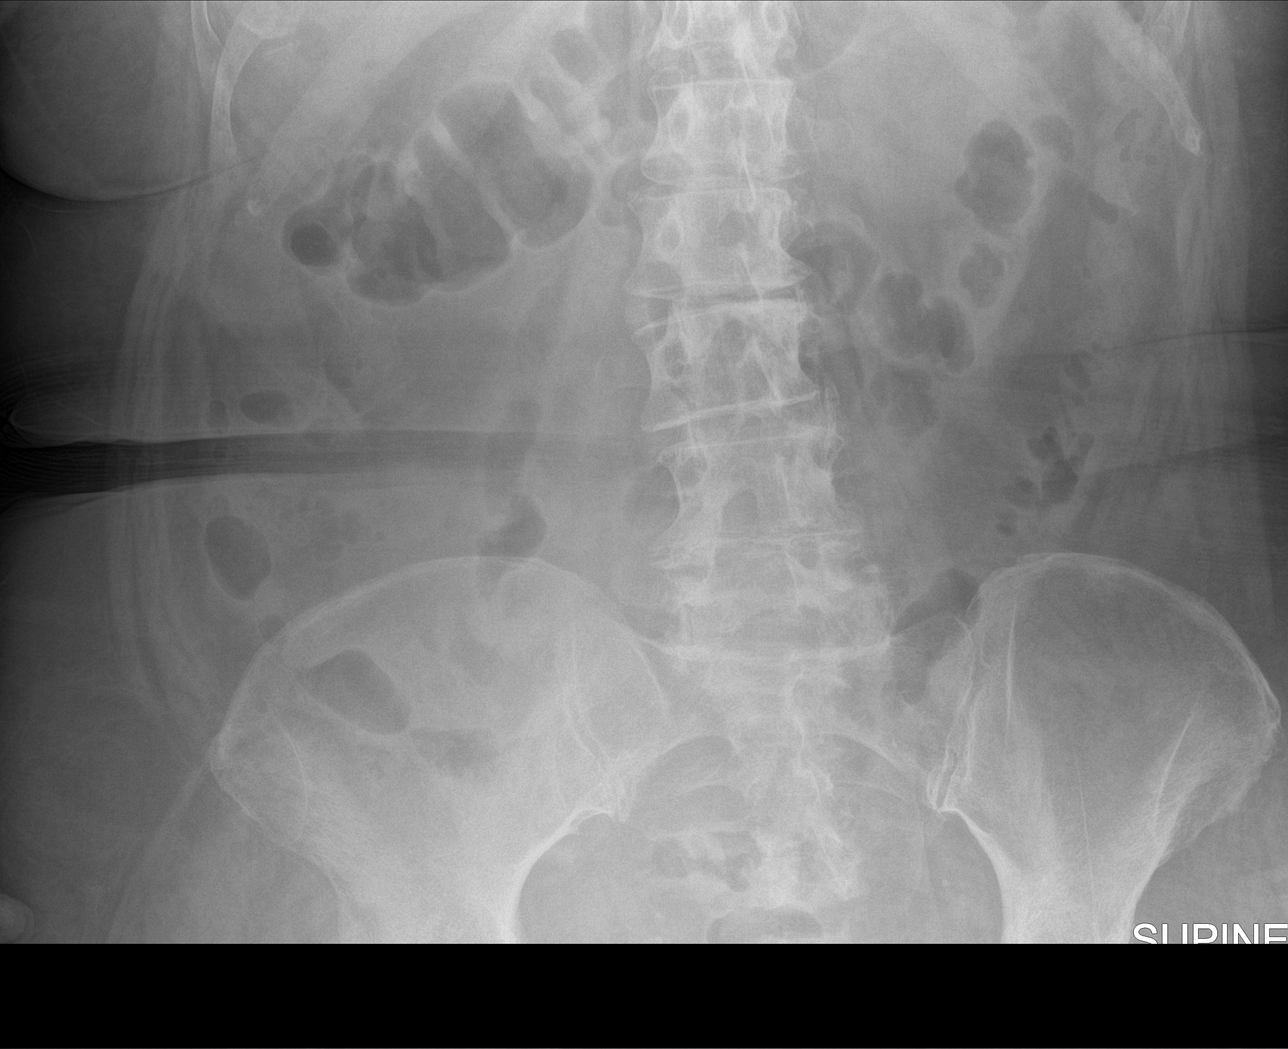

[abdomen supine (2 of 2)]
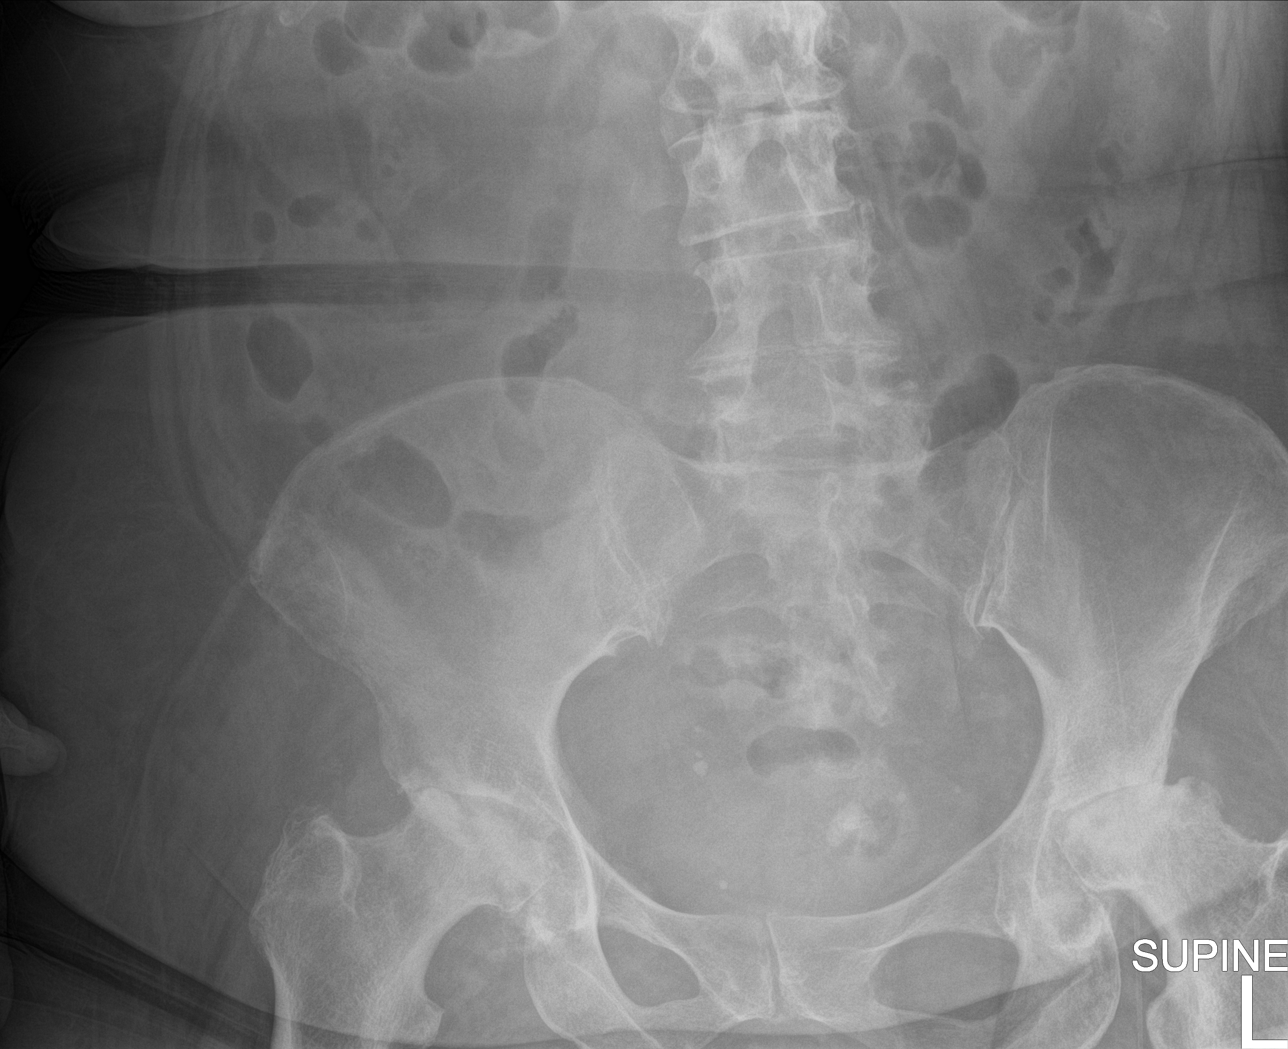

[chest ap]
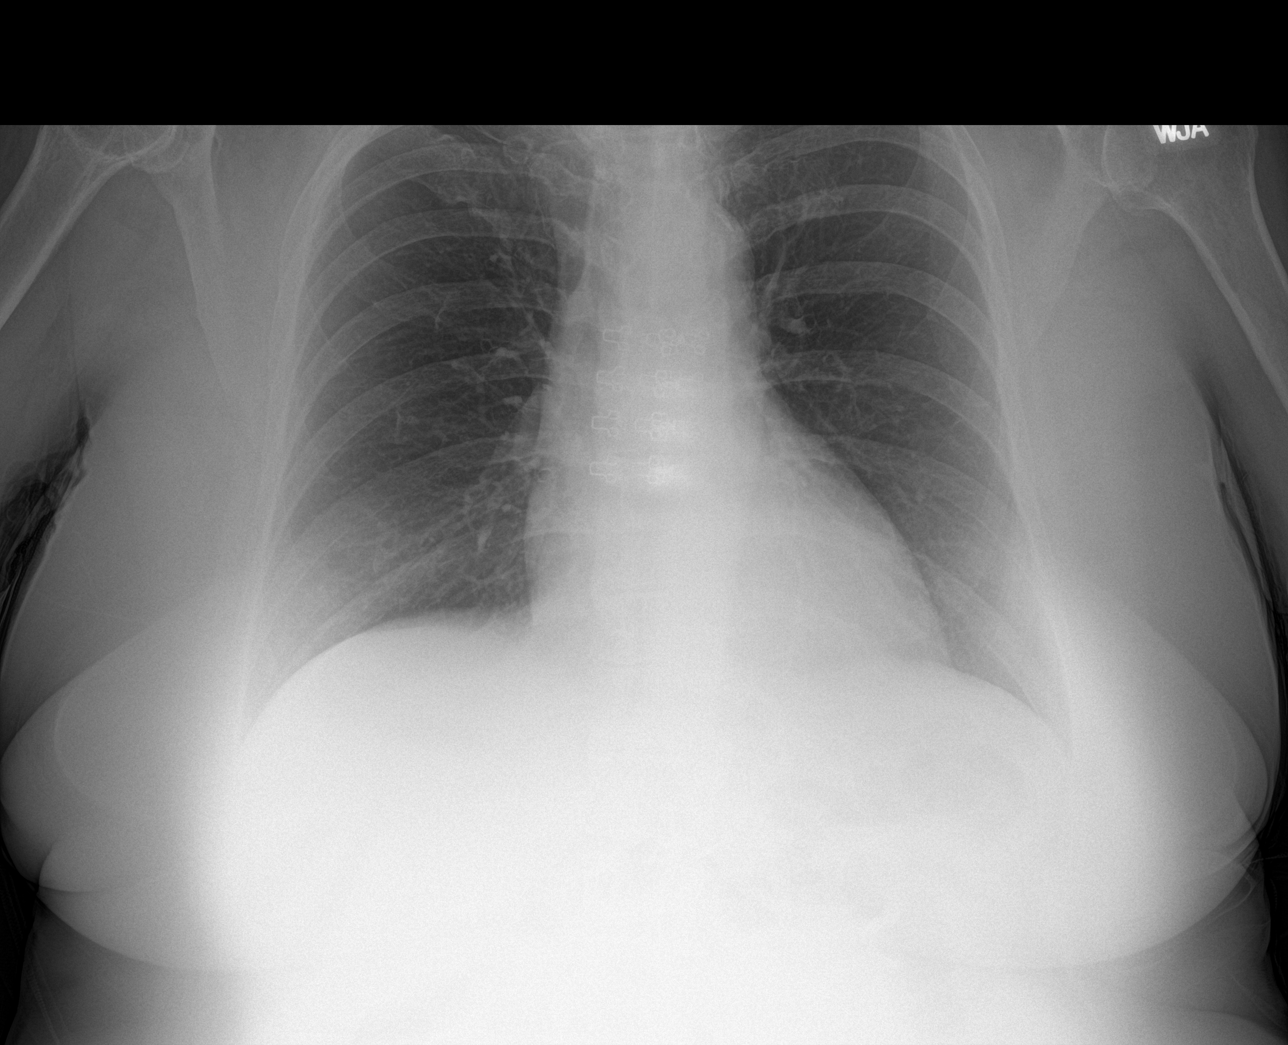

[4 of 4 positions shown; findings below may reference images not displayed]

FINDINGS: Single view of the chest demonstrates clear lungs and normal heart
size. No pneumothorax or pleural fluid.

Two views of the abdomen show no free intraperitoneal air. The bowel
gas pattern is normal. No unexpected abdominal calcification is
seen. There is convex right lumbar scoliosis and multilevel
spondylosis. Advanced degenerative change is present about the hips.
IMPRESSION: No acute abnormality.

Advanced bilateral hip osteoarthritis. Convex right lumbar scoliosis
and spondylosis also noted.

## 2017-03-11 ENCOUNTER — Other Ambulatory Visit: Payer: Medicare Other

## 2017-03-11 DIAGNOSIS — R197 Diarrhea, unspecified: Secondary | ICD-10-CM | POA: Diagnosis not present

## 2017-03-14 ENCOUNTER — Other Ambulatory Visit: Payer: Self-pay

## 2017-03-14 LAB — GASTROINTESTINAL PATHOGEN PANEL PCR
C. difficile Tox A/B, PCR: NOT DETECTED
CAMPYLOBACTER, PCR: NOT DETECTED
CRYPTOSPORIDIUM, PCR: NOT DETECTED
E COLI (ETEC) LT/ST, PCR: NOT DETECTED
E COLI 0157, PCR: NOT DETECTED
E coli (STEC) stx1/stx2, PCR: NOT DETECTED
GIARDIA LAMBLIA, PCR: NOT DETECTED
Norovirus, PCR: NOT DETECTED
Rotavirus A, PCR: NOT DETECTED
Salmonella, PCR: NOT DETECTED
Shigella, PCR: NOT DETECTED

## 2017-03-14 MED ORDER — SUCRALFATE 1 G PO TABS: 1.0000 g | ORAL_TABLET | Freq: Three times a day (TID) | ORAL | 0 refills | Status: DC

## 2017-03-14 NOTE — Telephone Encounter (Signed)
Carafate refilled as requested by [facility], faxed rx to [phone].

## 2017-03-17 ENCOUNTER — Ambulatory Visit (INDEPENDENT_AMBULATORY_CARE_PROVIDER_SITE_OTHER): Payer: Medicare Other | Admitting: Gastroenterology

## 2017-03-17 ENCOUNTER — Encounter: Payer: Self-pay | Admitting: Gastroenterology

## 2017-03-17 VITALS — BP 152/98 | HR 100 | Ht 63.0 in | Wt 201.0 lb

## 2017-03-17 DIAGNOSIS — R110 Nausea: Secondary | ICD-10-CM

## 2017-03-17 DIAGNOSIS — R1011 Right upper quadrant pain: Secondary | ICD-10-CM | POA: Diagnosis not present

## 2017-03-17 DIAGNOSIS — Z1211 Encounter for screening for malignant neoplasm of colon: Secondary | ICD-10-CM | POA: Diagnosis not present

## 2017-03-17 MED ORDER — PEG-KCL-NACL-NASULF-NA ASC-C 140 G PO SOLR: 1.0000 | ORAL | 0 refills | Status: DC

## 2017-03-17 NOTE — Patient Instructions (Addendum)

## 2017-03-17 NOTE — Progress Notes (Signed)
03/17/2017 [name] [id number] May 24, 1945   HISTORY OF PRESENT ILLNESS: This is a 71 year-old female who is known to Dr. Havery Moros.  She is status post cholecystectomy and status post gastric sleeve in May 2016.  She called our office in mid December complaining of 3-4 weeks of right upper quadrant abdominal pain with some severe intermittent pain at the time of her call.  She also complains of nausea and had a few episodes of vomiting as well.  Initially reported diarrhea/soft stool with mucus.  There is no fever or blood in her stools.  Abdominal x-ray was performed and showed moderate stool within the colon, but no other issues.  CBC and CMP were normal.  GI pathogen panel was negative.  She has complained of right upper quadrant abdominal pain in the past and actually had a CT scan in early 2018 that did not show any cause of her symptoms.  Is feeling better, but still with some RUQ soreness.  She is currently on pantoprazole 40 mg daily.  Her last colonoscopy was in 03/2007 at which time the study was normal except for extensive diverticulosis throughout the colon, but mostly on the left.   Past Medical History:  Diagnosis Date  . Anemia   . Antral polyp    benign  . Anxiety   . Arthritis   . Asthma   . B12 deficiency   . Back pain   . Chest pain   . Constipation   . Depression   . Diverticulosis   . Gallbladder problem   . GERD (gastroesophageal reflux disease) 09/09/2011  . History of transfusion   . HTN (hypertension) 09/09/2011  . Hyperlipemia 09/09/2011  . Hypertension   . IBS (irritable bowel syndrome)   . Internal hemorrhoid   . Joint pain   . Obesity   . Osteoarthritis   . Paroxysmal atrial fibrillation (HCC)   . PONV (postoperative nausea and vomiting)    with big surgeries, none with endos etc, with colonoscopy- 5-6 years ago could not swallow   . Shortness of breath   . Sleep apnea    borderline per patient no cpap   . SOB (shortness of breath) on  exertion   . Swallowing difficulty   . Tachycardia   . Unstable angina ([place]) 09/09/2011  . Vitamin D deficiency   . Vocal cord dysfunction 2013   swelling   Past Surgical History:  Procedure Laterality Date  . ABDOMINAL HYSTERECTOMY  1988  . ANKLE FUSION Right    x 2  . BACK SURGERY  [phone]   3 ruptured discs, lower back  . BILATERAL ANTERIOR TOTAL HIP ARTHROPLASTY Bilateral 11/12/2014   Procedure: BILATERAL ANTERIOR TOTAL HIP ARTHROPLASTY;  Surgeon: Paralee Cancel, MD;  Location: [facility];  Service: Orthopedics;  Laterality: Bilateral;  . BRAVO [place] STUDY [name] 03/06/2013   Procedure: BRAVO [place] STUDY;  Surgeon: Inda Castle, MD;  [facility]: [facility];  Service: Endoscopy;  Laterality: [name];  . CARDIAC CATHETERIZATION  2013   NORMAL  . CHOLECYSTECTOMY    . ELBOW SURGERY Left    removed bone chip  . ESOPHAGOGASTRODUODENOSCOPY [name] 03/06/2013   Procedure: ESOPHAGOGASTRODUODENOSCOPY (EGD);  Surgeon: Inda Castle, MD;  Location: [facility];  Service: Endoscopy;  Laterality: [name];  . FOOT OSTEOTOMY W/ PLANTAR FASCIA RELEASE Left   . ILIOTIBIAL BAND RELEASE Right 1997  . JOINT REPLACEMENT    . LAPAROSCOPIC GASTRIC SLEEVE RESECTION WITH HIATAL HERNIA REPAIR  08/20/2014  Procedure: LAPAROSCOPIC GASTRIC SLEEVE RESECTION WITH HIATAL HERNIA  REPAIR AND  UPPER ENDOSCOPY;  Surgeon: Johnathan Hausen, MD;  [facility]: [facility];  Service: General;;  . LEFT HEART CATHETERIZATION WITH CORONARY ANGIOGRAM Bilateral 09/10/2011   Procedure: LEFT HEART CATHETERIZATION WITH CORONARY ANGIOGRAM;  Surgeon: Lorretta Harp, MD;  Location[facility];  Service: Cardiovascular;  Laterality: Bilateral;  . NASAL SINUS SURGERY  1997  . REVERSE SHOULDER ARTHROPLASTY Right 05/14/2016   Procedure: RIGHT REVERSE SHOULDER ARTHROPLASTY;  Surgeon: Netta Cedars, MD;  Location: [place];  Service: Orthopedics;  Laterality: Right;  . SHOULDER SURGERY Left   . tfc wrist Left 1997  . thumb replacement Bilateral    one 2009 and  one 2014  . TOTAL ANKLE REPLACEMENT Right    x 3  . TOTAL ANKLE REPLACEMENT Left   . TOTAL ANKLE REPLACEMENT    . TOTAL HIP ARTHROPLASTY Bilateral   . TOTAL KNEE ARTHROPLASTY Bilateral    left 2002, right 2012  . TOTAL SHOULDER ARTHROPLASTY Right 05/14/2016  . TRANSTHORACIC ECHOCARDIOGRAM  09/09/2011   MILD CONCENTRIC LVH. TRACE MITRAL REGURG. TRACE TR. MILD AORTIC REGURG.  . 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 UPPER GI ENDOSCOPY  08/20/2014   Procedure: UPPER GI ENDOSCOPY;  Surgeon: Johnathan Hausen, MD;  [facility];  Service: General;;  . WRIST SURGERY Left 1997, 2009     reports that  has never smoked. she has never used smokeless tobacco. She reports that she does not drink alcohol or use drugs. family history includes Alcoholism in her father; Arthritis in her mother; Cancer in her father and mother; Diabetes in her mother; Esophageal cancer in her mother; Esophageal cancer (age of onset: 81) in her father; Heart disease in her maternal grandmother; Hypertension in her mother and sister; Other in her sister; Stroke in her paternal grandmother. Allergies  Allergen Reactions  . Ativan [Lorazepam] Other (See Comments)    Agitation, aggressive actions, significant mental changes  . Buprenorphine Hcl Itching and Rash  . Fentanyl Itching  . Morphine And Related Itching and Rash      Outpatient Encounter Medications as of 03/17/2017  Medication Sig  . acetaminophen (TYLENOL) 500 MG tablet Take 1,000 mg by mouth every 6 (six) hours as needed (for pain.).   [name] albuterol (PROVENTIL HFA;VENTOLIN HFA) 108 (90 Base) MCG/ACT inhaler Inhale 1-2 puffs into the lungs every 6 (six) hours as needed for shortness of breath or wheezing.  . Biotin 10000 MCG TABS Take 10,000 mcg by mouth at bedtime.   . cetirizine (ZYRTEC) 10 MG chewable tablet Chew 10 mg by mouth daily.  . cholecalciferol (VITAMIN D) 1000 units tablet Take 1 tablet (1,000 Units total) by mouth 2 (two) times daily.  .  clorazepate (TRANXENE) 7.5 MG tablet Take 7.5 mg by mouth 3 (three) times daily as needed for anxiety.  [name] diltiazem (CARTIA XT) 180 MG 24 hr capsule Take 1 capsule (180 mg total) by mouth daily.  . DULoxetine (CYMBALTA) 60 MG capsule Take 60 mg by mouth daily with supper.   . Multiple Vitamin (MULTIVITAMIN WITH MINERALS) TABS tablet Take 1 tablet by mouth 2 (two) times daily. Centrum  . pantoprazole (PROTONIX) 40 MG tablet Take 1 tablet (40 mg total) by mouth every morning.  . Probiotic Product (RESTORA) CAPS Take 1 capsule by mouth daily. (Patient taking differently: Take 1 capsule by mouth daily with supper. )  . simvastatin (ZOCOR) 20 MG tablet Take 1 tablet (20  mg total) by mouth daily.  . sucralfate (CARAFATE) 1 g tablet Take 1 tablet (1 g total) by mouth 4 (four) times daily -  with meals and at bedtime. May dissolve tablet in 1 tablespoon of water to take as a slurry  . traMADol (ULTRAM) 50 MG tablet Take 50 mg by mouth every 6 (six) hours as needed (for pain.).   [name] vitamin B-12 (CYANOCOBALAMIN) 1000 MCG tablet Take 1,000 mcg by mouth daily.  [name] PEG-KCl-NaCl-NaSulf-Na Asc-C (PLENVU) 140 g SOLR Take 1 kit by mouth as directed.  . [DISCONTINUED] metFORMIN (GLUCOPHAGE) 500 MG tablet Take 1 tablet (500 mg total) by mouth daily with breakfast.   No facility-administered encounter medications on file as of 03/17/2017.      REVIEW OF SYSTEMS  : All other systems reviewed and negative except where noted in the History of Present Illness.   PHYSICAL EXAM: BP (!) 152/98   Pulse 100   Ht 5' 3" (1.6 m)   Wt 201 lb (91.2 kg)   BMI 35.61 kg/m  General: Well developed white female in no acute distress Head: Normocephalic and atraumatic Eyes:  Sclerae anicteric, conjunctiva pink. Ears: Normal auditory acuity Lungs: Clear throughout to auscultation; no increased WOB. Heart: Regular rate and rhythm; no M/R/G. Abdomen: Soft, non-distended.  BS present.  Mild RUQ TTP. Rectal:  Will be done at the  time of colonoscopy. Musculoskeletal: Symmetrical with no gross deformities  Skin: No lesions on visible extremities Extremities: No edema  Neurological: Alert oriented x 4, grossly non-focal Psychological:  Alert and cooperative. Normal mood and affect  ASSESSMENT AND PLAN: *RUQ abdominal pain and nausea/vomiting:  Had similar symptoms in the past, CT scan negative in early 2018.  She is s/p cholecystectomy and then gastric sleeve in 07/2014.  Unsure of the cause of her symptoms at this time.  Symptoms somewhat improved but still present.  Will proceed with EGD to rule out ulcer, etc.  Continue daily PPI. *Screening colonoscopy:  Last was in 03/2017 and was normal.  Will schedule with Dr. Havery Moros.  **The risks, benefits, and alternatives to EGD and colonoscopy were discussed with the patient and she consents to proceed.    CC:  [name], MD

## 2017-03-23 ENCOUNTER — Encounter: Payer: Self-pay | Admitting: Gastroenterology

## 2017-03-23 ENCOUNTER — Ambulatory Visit (INDEPENDENT_AMBULATORY_CARE_PROVIDER_SITE_OTHER): Payer: Medicare Other | Admitting: Physician Assistant

## 2017-03-23 VITALS — BP 122/83 | HR 91 | Temp 97.4°F | Ht 63.0 in | Wt 198.0 lb

## 2017-03-23 DIAGNOSIS — Z6835 Body mass index (BMI) 35.0-35.9, adult: Secondary | ICD-10-CM | POA: Diagnosis not present

## 2017-03-23 DIAGNOSIS — E8881 Metabolic syndrome: Secondary | ICD-10-CM

## 2017-03-23 NOTE — Progress Notes (Signed)
Office: [phone]  /  Fax: [phone]   HPI:   Chief Complaint: OBESITY [name] is here to discuss her progress with her obesity treatment plan. She is on the keep a food journal with 1300 calories and 90 grams of protein daily and is following her eating plan approximately 0 % of the time. She states she is exercising 0 minutes 0 times per week. [name] has not been as mindful of her eating and she Green to struggle with planning her meals ahead of time. [name] also eats out most meals. Her weight is 198 lb (89.8 kg) today and has had a weight gain of 4 pounds over a period of 5 weeks since her last visit. She has gained 5 lbs since starting treatment with [place].  Insulin Resistance [name] has a diagnosis of insulin resistance based on her elevated fasting insulin level >5. Although [name]'s blood glucose readings are still under good control, insulin resistance puts her at greater risk of metabolic syndrome and diabetes. She stopped taking Metformin due to an episode of right upper quadrant abdominal pain, however she is now back on Metformin as she states it is not the cause of her pain. [name] to work on diet and exercise to decrease risk of diabetes. [name] polyphagia.  ALLERGIES: Allergies  Allergen Reactions  . Ativan [Lorazepam] Other (See Comments)    Agitation, aggressive actions, significant mental changes  . Buprenorphine Hcl Itching and Rash  . Fentanyl Itching  . Morphine And Related Itching and Rash    MEDICATIONS: Current Outpatient Medications on File Prior to Visit  Medication Sig Dispense Refill  . acetaminophen (TYLENOL) 500 MG tablet Take 1,000 mg by mouth every 6 (six) hours as needed (for pain.).     [name] albuterol (PROVENTIL HFA;VENTOLIN HFA) 108 (90 Base) MCG/ACT inhaler Inhale 1-2 puffs into the lungs every 6 (six) hours as needed for shortness of breath or wheezing.    . Biotin 10000 MCG TABS Take 10,000 mcg by mouth at bedtime.       . cetirizine (ZYRTEC) 10 MG chewable tablet Chew 10 mg by mouth daily.    . cholecalciferol (VITAMIN D) 1000 units tablet Take 1 tablet (1,000 Units total) by mouth 2 (two) times daily. 60 tablet 0  . clorazepate (TRANXENE) 7.5 MG tablet Take 7.5 mg by mouth 3 (three) times daily as needed for anxiety.    [name] diltiazem (CARTIA XT) 180 MG 24 hr capsule Take 1 capsule (180 mg total) by mouth daily. 30 capsule 11  . DULoxetine (CYMBALTA) 60 MG capsule Take 60 mg by mouth daily with supper.     . Multiple Vitamin (MULTIVITAMIN WITH MINERALS) TABS tablet Take 1 tablet by mouth 2 (two) times daily. Centrum    . pantoprazole (PROTONIX) 40 MG tablet Take 1 tablet (40 mg total) by mouth every morning. 90 tablet 3  . PEG-KCl-NaCl-NaSulf-Na Asc-C (PLENVU) 140 g SOLR Take 1 kit by mouth as directed. 1 each 0  . Probiotic Product (RESTORA) CAPS Take 1 capsule by mouth daily. (Patient taking differently: Take 1 capsule by mouth daily with supper. ) 30 capsule 2  . simvastatin (ZOCOR) 20 MG tablet Take 1 tablet (20 mg total) by mouth daily. 30 tablet 11  . sucralfate (CARAFATE) 1 g tablet Take 1 tablet (1 g total) by mouth 4 (four) times daily -  with meals and at bedtime. May dissolve tablet in 1 tablespoon of water to take as a slurry 120 tablet 0  .  traMADol (ULTRAM) 50 MG tablet Take 50 mg by mouth every 6 (six) hours as needed (for pain.).     [name] vitamin B-12 (CYANOCOBALAMIN) 1000 MCG tablet Take 1,000 mcg by mouth daily.     No current facility-administered medications on file prior to visit.     PAST MEDICAL HISTORY: Past Medical History:  Diagnosis Date  . Anemia   . Antral polyp    benign  . Anxiety   . Arthritis   . Asthma   . B12 deficiency   . Back pain   . Chest pain   . Constipation   . Depression   . Diverticulosis   . Gallbladder problem   . GERD (gastroesophageal reflux disease) 09/09/2011  . History of transfusion   . HTN (hypertension) 09/09/2011  . Hyperlipemia 09/09/2011  .  Hypertension   . IBS (irritable bowel syndrome)   . Internal hemorrhoid   . Joint pain   . Obesity   . Osteoarthritis   . Paroxysmal atrial fibrillation (HCC)   . PONV (postoperative nausea and vomiting)    with big surgeries, none with endos etc, with colonoscopy- 5-6 years ago could not swallow   . Shortness of breath   . Sleep apnea    borderline per patient no cpap   . SOB (shortness of breath) on exertion   . Swallowing difficulty   . Tachycardia   . Unstable angina ([place]) 09/09/2011  . Vitamin D deficiency   . Vocal cord dysfunction 2013   swelling    PAST SURGICAL HISTORY: Past Surgical History:  Procedure Laterality Date  . ABDOMINAL HYSTERECTOMY  1988  . ANKLE FUSION Right    x 2  . BACK SURGERY  [id number]   3 ruptured discs, lower back  . BILATERAL ANTERIOR TOTAL HIP ARTHROPLASTY Bilateral 11/12/2014   Procedure: BILATERAL ANTERIOR TOTAL HIP ARTHROPLASTY;  Surgeon: Paralee Cancel, MD;  [facility];  Service: Orthopedics;  Laterality: Bilateral;  . BRAVO [place] STUDY [name] 03/06/2013   Procedure: BRAVO [place] STUDY;  Surgeon: Inda Castle, MD;  [facility];  Service: Endoscopy;  Laterality: [name];  . CARDIAC CATHETERIZATION  2013   NORMAL  . CHOLECYSTECTOMY    . ELBOW SURGERY Left    removed bone chip  . ESOPHAGOGASTRODUODENOSCOPY [name] 03/06/2013   Procedure: ESOPHAGOGASTRODUODENOSCOPY (EGD);  Surgeon: Inda Castle, MD;  Location: [facility];  Service: Endoscopy;  Laterality: [name];  . FOOT OSTEOTOMY W/ PLANTAR FASCIA RELEASE Left   . ILIOTIBIAL BAND RELEASE Right 1997  . JOINT REPLACEMENT    . LAPAROSCOPIC GASTRIC SLEEVE RESECTION WITH HIATAL HERNIA REPAIR  08/20/2014   Procedure: LAPAROSCOPIC GASTRIC SLEEVE RESECTION WITH HIATAL HERNIA  REPAIR AND  UPPER ENDOSCOPY;  Surgeon: Johnathan Hausen, MD;  Location: [facility];  Service: General;;  . LEFT HEART CATHETERIZATION WITH CORONARY ANGIOGRAM Bilateral 09/10/2011   Procedure: LEFT HEART CATHETERIZATION WITH  CORONARY ANGIOGRAM;  Surgeon: Lorretta Harp, MD;  Location[facility] LAB;  Service: Cardiovascular;  Laterality: Bilateral;  . NASAL SINUS SURGERY  1997  . REVERSE SHOULDER ARTHROPLASTY Right 05/14/2016   Procedure: RIGHT REVERSE SHOULDER ARTHROPLASTY;  Surgeon: Netta Cedars, MD;  Location: [place];  Service: Orthopedics;  Laterality: Right;  . SHOULDER SURGERY Left   . tfc wrist Left 1997  . thumb replacement Bilateral    one 2009 and one 2014  . TOTAL ANKLE REPLACEMENT Right    x 3  . TOTAL ANKLE REPLACEMENT Left   . TOTAL ANKLE REPLACEMENT    .  TOTAL HIP ARTHROPLASTY Bilateral   . TOTAL KNEE ARTHROPLASTY Bilateral    left 2002, right 2012  . TOTAL SHOULDER ARTHROPLASTY Right 05/14/2016  . TRANSTHORACIC ECHOCARDIOGRAM  09/09/2011   MILD CONCENTRIC LVH. TRACE MITRAL REGURG. TRACE TR. MILD AORTIC REGURG.  . 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 UPPER GI ENDOSCOPY  08/20/2014   Procedure: UPPER GI ENDOSCOPY;  Surgeon: Johnathan Hausen, MD;  [facility];  Service: General;;  . WRIST SURGERY Left 1997, 2009     SOCIAL HISTORY: Social History   Tobacco Use  . Smoking status: Never Smoker  . Smokeless tobacco: Never Used  Substance Use Topics  . Alcohol use: No  . Drug use: No    FAMILY HISTORY: Family History  Problem Relation Age of Onset  . Hypertension Mother   . Diabetes Mother   . Arthritis Mother   . Esophageal cancer Mother   . Cancer Mother   . Esophageal cancer Father 20  . Cancer Father   . Alcoholism Father   . Other Sister        kidney mass removed  . Hypertension Sister   . Heart disease Maternal Grandmother   . Stroke Paternal Grandmother   . Coronary artery disease Neg Hx   . Colon cancer Neg Hx   . Liver disease Neg Hx   . Kidney disease Neg Hx     ROS: Review of Systems  Constitutional: Negative for malaise/fatigue.  Endo/Heme/Allergies:       Negative polyphagia    PHYSICAL EXAM: Blood pressure 122/83, pulse 91, temperature (!)  97.4 F (36.3 C), temperature source Oral, height '5\' 3"'  (1.6 m), weight 198 lb (89.8 kg), SpO2 98 %. Body mass index is 35.07 kg/m. Physical Exam  Constitutional: She is oriented to person, place, and time. She appears well-developed and well-nourished.  Cardiovascular: Normal rate.  Pulmonary/Chest: Effort normal.  Musculoskeletal: Normal range of motion.  Neurological: She is oriented to person, place, and time.  Skin: Skin is warm and dry.  Psychiatric: She has a normal mood and affect. Her behavior is normal.  Vitals reviewed.   RECENT LABS AND TESTS: BMET    Component Value Date/Time   NA 143 03/04/2017 1109   NA 146 (H) 02/15/2017 1207   K 3.8 03/04/2017 1109   CL 108 03/04/2017 1109   CO2 30 03/04/2017 1109   GLUCOSE 76 03/04/2017 1109   BUN 18 03/04/2017 1109   BUN 15 02/15/2017 1207   CREATININE 0.99 03/04/2017 1109   CALCIUM 8.8 03/04/2017 1109   GFRNONAA 77 02/15/2017 1207   GFRAA 89 02/15/2017 1207   Lab Results  Component Value Date   HGBA1C 5.4 10/08/2016   HGBA1C 5.8 (H) 10/13/2014   Lab Results  Component Value Date   INSULIN 8.2 02/15/2017   INSULIN 9.8 10/08/2016   CBC    Component Value Date/Time   WBC 4.2 03/04/2017 1109   RBC 4.09 03/04/2017 1109   HGB 12.7 03/04/2017 1109   HGB 13.0 02/15/2017 1207   HCT 39.0 03/04/2017 1109   HCT 40.0 02/15/2017 1207   PLT 183.0 03/04/2017 1109   MCV 95.3 03/04/2017 1109   MCV 94 02/15/2017 1207   MCH 30.4 02/15/2017 1207   MCH 31.7 05/10/2016 1304   MCHC 32.7 03/04/2017 1109   RDW 13.1 03/04/2017 1109   RDW 13.3 02/15/2017 1207   LYMPHSABS 1.4 03/04/2017 1109   LYMPHSABS 1.4 02/15/2017 1207   MONOABS 0.3 03/04/2017  1109   EOSABS 0.1 03/04/2017 1109   EOSABS 0.1 02/15/2017 1207   BASOSABS 0.0 03/04/2017 1109   BASOSABS 0.0 02/15/2017 1207   Iron/TIBC/Ferritin/ %Sat No results found for: IRON, TIBC, FERRITIN, IRONPCTSAT Lipid Panel     Component Value Date/Time   CHOL 204 (H) 02/15/2017  1207   TRIG 115 02/15/2017 1207   HDL 84 02/15/2017 1207   LDLCALC 97 02/15/2017 1207   Hepatic Function Panel     Component Value Date/Time   PROT 6.0 03/04/2017 1109   PROT 5.8 (L) 02/15/2017 1207   ALBUMIN 3.8 03/04/2017 1109   ALBUMIN 4.1 02/15/2017 1207   AST 22 03/04/2017 1109   ALT 14 03/04/2017 1109   ALKPHOS 78 03/04/2017 1109   BILITOT 0.5 03/04/2017 1109   BILITOT 0.3 02/15/2017 1207      Component Value Date/Time   TSH 1.60 09/30/2016   TSH 0.771 10/13/2014 1700   TSH 1.37 08/03/2013 1421    ASSESSMENT AND PLAN: Insulin resistance  Class 2 severe obesity with serious comorbidity and body mass index (BMI) of 35.0 to 35.9 in adult, unspecified obesity type (HCC)  PLAN:  Insulin Resistance [name] will continue to work on weight loss, exercise, and decreasing simple carbohydrates in her diet to help decrease the risk of diabetes. We dicussed metformin including benefits and risks. She was informed that eating too many simple carbohydrates or too many calories at one sitting increases the likelihood of GI side effects. [name] will continue Metformin as prescribed and follow up with [place] as directed to monitor her progress.  We spent > than 50% of the 15 minute visit on the counseling as documented in the note.  Obesity [name] is currently in the action stage of change. As such, her goal is to continue with weight loss efforts She has agreed to keep a food journal with 1300 calories and 90 grams of protein daily Olie has been instructed to work up to a goal of 150 minutes of combined cardio and strengthening exercise per week for weight loss and overall health benefits. We discussed the following Behavioral Modification Strategies today: increasing lean protein intake and planning for success  Lidwina has agreed to follow up with our clinic in 2 weeks. She was informed of the importance of frequent follow up visits to maximize her success with intensive lifestyle  modifications for her multiple health conditions.  I, Doreene Nest, am acting as transcriptionist for Lacy Duverney, PA-C  I have reviewed the above documentation for accuracy and completeness, and I agree with the above. -Lacy Duverney, PA-C  I have reviewed the above note and agree with the plan. -Theresa Nip, MD   OBESITY BEHAVIORAL INTERVENTION VISIT  Today's visit was # 6 out of 22.  Starting weight: 193 lbs Starting date: 10/08/16 Today's weight : 198 lbs  Today's date: 03/23/2017 Total lbs lost to date: 0 (Patients must lose 7 lbs in the first 6 months to continue with counseling)   ASK: We discussed the diagnosis of obesity with [name] today and Michel agreed to give [place] permission to discuss obesity behavioral modification therapy today.  ASSESS: Tarissa has the diagnosis of obesity and her BMI today is 35.08 Eryn is in the action stage of change   ADVISE: Shifra was educated on the multiple health risks of obesity as well as the benefit of weight loss to improve her health. She was advised of the need for long term treatment and the importance of lifestyle modifications.  AGREE: Multiple dietary modification options and treatment options were discussed and  Adabella agreed to keep a food journal with 1300 calories and 90 grams of protein daily We discussed the following Behavioral Modification Strategies today: increasing lean protein intake and planning for success

## 2017-03-24 DIAGNOSIS — Z1211 Encounter for screening for malignant neoplasm of colon: Secondary | ICD-10-CM | POA: Insufficient documentation

## 2017-03-25 NOTE — Progress Notes (Signed)
Agree with assessment and plan as outlined.  

## 2017-03-31 DIAGNOSIS — M79641 Pain in right hand: Secondary | ICD-10-CM | POA: Diagnosis not present

## 2017-03-31 DIAGNOSIS — M19042 Primary osteoarthritis, left hand: Secondary | ICD-10-CM | POA: Diagnosis not present

## 2017-03-31 DIAGNOSIS — M79642 Pain in left hand: Secondary | ICD-10-CM | POA: Diagnosis not present

## 2017-03-31 DIAGNOSIS — M19041 Primary osteoarthritis, right hand: Secondary | ICD-10-CM | POA: Diagnosis not present

## 2017-04-05 ENCOUNTER — Ambulatory Visit (INDEPENDENT_AMBULATORY_CARE_PROVIDER_SITE_OTHER): Payer: Medicare Other | Admitting: Family Medicine

## 2017-04-05 ENCOUNTER — Telehealth: Payer: Self-pay | Admitting: Gastroenterology

## 2017-04-05 VITALS — BP 110/73 | HR 91 | Temp 97.7°F | Ht 63.0 in | Wt 200.0 lb

## 2017-04-05 DIAGNOSIS — F3289 Other specified depressive episodes: Secondary | ICD-10-CM | POA: Diagnosis not present

## 2017-04-05 DIAGNOSIS — E8881 Metabolic syndrome: Secondary | ICD-10-CM

## 2017-04-05 DIAGNOSIS — Z6835 Body mass index (BMI) 35.0-35.9, adult: Secondary | ICD-10-CM

## 2017-04-05 MED ORDER — BUPROPION HCL ER (SR) 150 MG PO TB12: 150.0000 mg | ORAL_TABLET | Freq: Every day | ORAL | 0 refills | Status: DC

## 2017-04-05 MED ORDER — PEG-KCL-NACL-NASULF-NA ASC-C 140 G PO SOLR: 1.0000 | Freq: Once | ORAL | 0 refills | Status: AC

## 2017-04-05 NOTE — Progress Notes (Signed)
Office: [phone]  /  Fax: [phone]   HPI:   Chief Complaint: OBESITY [name] is here to discuss her progress with her obesity treatment plan. She is on the keep a food journal with 1300 calories and 90 grams of protein daily and is following her eating plan approximately 0 % of the time. She states she is walking for 20 minutes 2-3 times per week. [name] is struggling with increased stress eating and eats out most days. Status post gastric sleeve and last visit after surgery while eating out and feels she should be able to do this. She has been resistant to making changes we have asked.  Her weight is 200 lb (90.7 kg) today and has gained 2 pounds since her last visit. She has lost 7 lbs since starting treatment with [place].  Insulin Resistance [name] has a diagnosis of insulin resistance based on her elevated fasting insulin level >5. Although [name]'s blood glucose readings are still under good control, insulin resistance puts her at greater risk of metabolic syndrome and diabetes. She is on metformin but still having GI upset, she is scheduled for colonoscopy and esophagogastroduodenoscopy soon.  Depression with emotional eating behaviors [name] is on Cymbalta but has had increased stress with her and her husbands health. She notes increased emotional eating even when she is not hungry. [name] struggles with emotional eating and using food for comfort to the extent that it is negatively impacting her health. She often snacks when she is not hungry. [name] sometimes feels she is out of control and then feels guilty that she made poor food choices. She has been working on behavior modification techniques to help reduce her emotional eating and has been somewhat successful. She shows no sign of suicidal or homicidal ideations.  Depression screen [facility] 2/9 10/08/2016 02/04/2016 11/05/2015 08/06/2015 05/09/2015  Decreased Interest 1 0 0 0 0  Down, Depressed, Hopeless 1 0 0 0 0  PHQ - 2 Score  2 0 0 0 0  Altered sleeping 0 - - - -  Tired, decreased energy 0 - - - -  Change in appetite 0 - - - -  Feeling bad or failure about yourself  0 - - - -  Trouble concentrating 0 - - - -  Moving slowly or fidgety/restless 0 - - - -  Suicidal thoughts 0 - - - -  PHQ-9 Score 2 - - - -   ALLERGIES: Allergies  Allergen Reactions  . Ativan [Lorazepam] Other (See Comments)    Agitation, aggressive actions, significant mental changes  . Buprenorphine Hcl Itching and Rash  . Fentanyl Itching  . Morphine And Related Itching and Rash    MEDICATIONS: Current Outpatient Medications on File Prior to Visit  Medication Sig Dispense Refill  . acetaminophen (TYLENOL) 500 MG tablet Take 1,000 mg by mouth every 6 (six) hours as needed (for pain.).     [name] albuterol (PROVENTIL HFA;VENTOLIN HFA) 108 (90 Base) MCG/ACT inhaler Inhale 1-2 puffs into the lungs every 6 (six) hours as needed for shortness of breath or wheezing.    . Biotin 10000 MCG TABS Take 10,000 mcg by mouth at bedtime.     . cetirizine (ZYRTEC) 10 MG chewable tablet Chew 10 mg by mouth daily.    . cholecalciferol (VITAMIN D) 1000 units tablet Take 1 tablet (1,000 Units total) by mouth 2 (two) times daily. 60 tablet 0  . clorazepate (TRANXENE) 7.5 MG tablet Take 7.5 mg by mouth 3 (three) times daily as needed  for anxiety.    [name] diltiazem (CARTIA XT) 180 MG 24 hr capsule Take 1 capsule (180 mg total) by mouth daily. 30 capsule 11  . DULoxetine (CYMBALTA) 60 MG capsule Take 60 mg by mouth daily with supper.     . Multiple Vitamin (MULTIVITAMIN WITH MINERALS) TABS tablet Take 1 tablet by mouth 2 (two) times daily. Centrum    . pantoprazole (PROTONIX) 40 MG tablet Take 1 tablet (40 mg total) by mouth every morning. 90 tablet 3  . Probiotic Product (RESTORA) CAPS Take 1 capsule by mouth daily. (Patient taking differently: Take 1 capsule by mouth daily with supper. ) 30 capsule 2  . simvastatin (ZOCOR) 20 MG tablet Take 1 tablet (20 mg total) by  mouth daily. 30 tablet 11  . sucralfate (CARAFATE) 1 g tablet Take 1 tablet (1 g total) by mouth 4 (four) times daily -  with meals and at bedtime. May dissolve tablet in 1 tablespoon of water to take as a slurry 120 tablet 0  . traMADol (ULTRAM) 50 MG tablet Take 50 mg by mouth every 6 (six) hours as needed (for pain.).     [name] vitamin B-12 (CYANOCOBALAMIN) 1000 MCG tablet Take 1,000 mcg by mouth daily.     No current facility-administered medications on file prior to visit.     PAST MEDICAL HISTORY: Past Medical History:  Diagnosis Date  . Anemia   . Antral polyp    benign  . Anxiety   . Arthritis   . Asthma   . B12 deficiency   . Back pain   . Chest pain   . Constipation   . Depression   . Diverticulosis   . Gallbladder problem   . GERD (gastroesophageal reflux disease) 09/09/2011  . History of transfusion   . HTN (hypertension) 09/09/2011  . Hyperlipemia 09/09/2011  . Hypertension   . IBS (irritable bowel syndrome)   . Internal hemorrhoid   . Joint pain   . Obesity   . Osteoarthritis   . Paroxysmal atrial fibrillation (HCC)   . PONV (postoperative nausea and vomiting)    with big surgeries, none with endos etc, with colonoscopy- 5-6 years ago could not swallow   . Shortness of breath   . Sleep apnea    borderline per patient no cpap   . SOB (shortness of breath) on exertion   . Swallowing difficulty   . Tachycardia   . Unstable angina ([place]) 09/09/2011  . Vitamin D deficiency   . Vocal cord dysfunction 2013   swelling    PAST SURGICAL HISTORY: Past Surgical History:  Procedure Laterality Date  . ABDOMINAL HYSTERECTOMY  1988  . ANKLE FUSION Right    x 2  . BACK SURGERY  [phone]   3 ruptured discs, lower back  . BILATERAL ANTERIOR TOTAL HIP ARTHROPLASTY Bilateral 11/12/2014   Procedure: BILATERAL ANTERIOR TOTAL HIP ARTHROPLASTY;  Surgeon: Paralee Cancel, MD;  Location: [facility];  Service: Orthopedics;  Laterality: Bilateral;  . BRAVO [place] STUDY [name] 03/06/2013    Procedure: BRAVO [place] STUDY;  Surgeon: Inda Castle, MD;  Location: [facility];  Service: Endoscopy;  Laterality: [name];  . CARDIAC CATHETERIZATION  2013   NORMAL  . CHOLECYSTECTOMY    . ELBOW SURGERY Left    removed bone chip  . ESOPHAGOGASTRODUODENOSCOPY [name] 03/06/2013   Procedure: ESOPHAGOGASTRODUODENOSCOPY (EGD);  Surgeon: Inda Castle, MD;  Location: [facility];  Service: Endoscopy;  Laterality: [name];  . FOOT OSTEOTOMY W/ PLANTAR FASCIA RELEASE Left   .  ILIOTIBIAL BAND RELEASE Right 1997  . JOINT REPLACEMENT    . LAPAROSCOPIC GASTRIC SLEEVE RESECTION WITH HIATAL HERNIA REPAIR  08/20/2014   Procedure: LAPAROSCOPIC GASTRIC SLEEVE RESECTION WITH HIATAL HERNIA  REPAIR AND  UPPER ENDOSCOPY;  Surgeon: Johnathan Hausen, MD;  Location: [facility];  Service: General;;  . LEFT HEART CATHETERIZATION WITH CORONARY ANGIOGRAM Bilateral 09/10/2011   Procedure: LEFT HEART CATHETERIZATION WITH CORONARY ANGIOGRAM;  Surgeon: Lorretta Harp, MD;  Location: [facility];  Service: Cardiovascular;  Laterality: Bilateral;  . NASAL SINUS SURGERY  1997  . REVERSE SHOULDER ARTHROPLASTY Right 05/14/2016   Procedure: RIGHT REVERSE SHOULDER ARTHROPLASTY;  Surgeon: Netta Cedars, MD;  Location: [place];  Service: Orthopedics;  Laterality: Right;  . SHOULDER SURGERY Left   . tfc wrist Left 1997  . thumb replacement Bilateral    one 2009 and one 2014  . TOTAL ANKLE REPLACEMENT Right    x 3  . TOTAL ANKLE REPLACEMENT Left   . TOTAL ANKLE REPLACEMENT    . TOTAL HIP ARTHROPLASTY Bilateral   . TOTAL KNEE ARTHROPLASTY Bilateral    left 2002, right 2012  . TOTAL SHOULDER ARTHROPLASTY Right 05/14/2016  . TRANSTHORACIC ECHOCARDIOGRAM  09/09/2011   MILD CONCENTRIC LVH. TRACE MITRAL REGURG. TRACE TR. MILD AORTIC REGURG.  . 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 UPPER GI ENDOSCOPY  08/20/2014   Procedure: UPPER GI ENDOSCOPY;  Surgeon: Johnathan Hausen, MD;  Location: [facility];  Service: General;;  . WRIST SURGERY Left  1997, 2009     SOCIAL HISTORY: Social History   Tobacco Use  . Smoking status: Never Smoker  . Smokeless tobacco: Never Used  Substance Use Topics  . Alcohol use: No  . Drug use: No    FAMILY HISTORY: Family History  Problem Relation Age of Onset  . Hypertension Mother   . Diabetes Mother   . Arthritis Mother   . Esophageal cancer Mother   . Cancer Mother   . Esophageal cancer Father 57  . Cancer Father   . Alcoholism Father   . Other Sister        kidney mass removed  . Hypertension Sister   . Heart disease Maternal Grandmother   . Stroke Paternal Grandmother   . Coronary artery disease Neg Hx   . Colon cancer Neg Hx   . Liver disease Neg Hx   . Kidney disease Neg Hx     ROS: Review of Systems  Constitutional: Negative for weight loss.  Psychiatric/Behavioral: Positive for depression. Negative for suicidal ideas.    PHYSICAL EXAM: Blood pressure 110/73, pulse 91, temperature 97.7 F (36.5 C), temperature source Oral, height 5\' 3"  (1.6 m), weight 200 lb (90.7 kg), SpO2 100 %. Body mass index is 35.43 kg/m. Physical Exam  Constitutional: She is oriented to person, place, and time. She appears well-developed and well-nourished.  Cardiovascular: Normal rate.  Pulmonary/Chest: Effort normal.  Musculoskeletal: Normal range of motion.  Neurological: She is oriented to person, place, and time.  Skin: Skin is warm and dry.  Psychiatric: She has a normal mood and affect.  Vitals reviewed.   RECENT LABS AND TESTS: BMET    Component Value Date/Time   NA 143 03/04/2017 1109   NA 146 (H) 02/15/2017 1207   K 3.8 03/04/2017 1109   CL 108 03/04/2017 1109   CO2 30 03/04/2017 1109   GLUCOSE 76 03/04/2017 1109   BUN 18 03/04/2017 1109   BUN 15 02/15/2017 1207  CREATININE 0.99 03/04/2017 1109   CALCIUM 8.8 03/04/2017 1109   GFRNONAA 77 02/15/2017 1207   GFRAA 89 02/15/2017 1207   Lab Results  Component Value Date   HGBA1C 5.4 10/08/2016   HGBA1C 5.8 (H)  10/13/2014   Lab Results  Component Value Date   INSULIN 8.2 02/15/2017   INSULIN 9.8 10/08/2016   CBC    Component Value Date/Time   WBC 4.2 03/04/2017 1109   RBC 4.09 03/04/2017 1109   HGB 12.7 03/04/2017 1109   HGB 13.0 02/15/2017 1207   HCT 39.0 03/04/2017 1109   HCT 40.0 02/15/2017 1207   PLT 183.0 03/04/2017 1109   MCV 95.3 03/04/2017 1109   MCV 94 02/15/2017 1207   MCH 30.4 02/15/2017 1207   MCH 31.7 05/10/2016 1304   MCHC 32.7 03/04/2017 1109   RDW 13.1 03/04/2017 1109   RDW 13.3 02/15/2017 1207   LYMPHSABS 1.4 03/04/2017 1109   LYMPHSABS 1.4 02/15/2017 1207   MONOABS 0.3 03/04/2017 1109   EOSABS 0.1 03/04/2017 1109   EOSABS 0.1 02/15/2017 1207   BASOSABS 0.0 03/04/2017 1109   BASOSABS 0.0 02/15/2017 1207   Iron/TIBC/Ferritin/ %Sat No results found for: IRON, TIBC, FERRITIN, IRONPCTSAT Lipid Panel     Component Value Date/Time   CHOL 204 (H) 02/15/2017 1207   TRIG 115 02/15/2017 1207   HDL 84 02/15/2017 1207   LDLCALC 97 02/15/2017 1207   Hepatic Function Panel     Component Value Date/Time   PROT 6.0 03/04/2017 1109   PROT 5.8 (L) 02/15/2017 1207   ALBUMIN 3.8 03/04/2017 1109   ALBUMIN 4.1 02/15/2017 1207   AST 22 03/04/2017 1109   ALT 14 03/04/2017 1109   ALKPHOS 78 03/04/2017 1109   BILITOT 0.5 03/04/2017 1109   BILITOT 0.3 02/15/2017 1207      Component Value Date/Time   TSH 1.60 09/30/2016   TSH 0.771 10/13/2014 1700   TSH 1.37 08/03/2013 1421    ASSESSMENT AND PLAN: Insulin resistance  Other depression - with emotional eating - Plan: buPROPion (WELLBUTRIN SR) 150 MG 12 hr tablet  Class 2 severe obesity with serious comorbidity and body mass index (BMI) of 35.0 to 35.9 in adult, unspecified obesity type (HCC)  PLAN:  Insulin Resistance [name] agrees to discontinue metformin and will follow up with GI and will continue to work on weight loss, diet, exercise, and decreasing simple carbohydrates in her diet to prevent diabetes. We  dicussed metformin including benefits and risks. She was informed that eating too many simple carbohydrates or too many calories at one sitting increases the likelihood of GI side effects. [name] agrees to follow up with our clinic in 2 weeks as directed to monitor her progress.  Depression with Emotional Eating Behaviors We discussed behavior modification techniques today to help [name] deal with her emotional eating and depression. [name] agrees to continue Cymbalta as prescribed and she agrees to start Wellbutrin SR 150 mg q AM #30 with no refills. [name] agrees to follow up with our clinic in 2 weeks.  Obesity I am not certain [name] is in the action stage of change. Will treat her depression first and reevaluate. As such, her goal is to continue with weight loss efforts She has agreed to keep a food journal with 1300 calories and 75+ grams of protein daily [name] has been instructed to work up to a goal of 150 minutes of combined cardio and strengthening exercise per week for weight loss and overall health benefits. We discussed the  following Behavioral Modification Strategies today: increasing lean protein intake and decrease eating out   [name] has agreed to follow up with our clinic in 2 weeks. She was informed of the importance of frequent follow up visits to maximize her success with intensive lifestyle modifications for her multiple health conditions.   OBESITY BEHAVIORAL INTERVENTION VISIT  Today's visit was # 7 out of 22.  Starting weight: 193 lbs Starting date: 10/08/16 Today's weight : 200 lbs  Today's date: 04/05/2017 Total lbs lost to date: 7 (Patients must lose 7 lbs in the first 6 months to continue with counseling)   ASK: We discussed the diagnosis of obesity with [name] today and [name] agreed to give [place] permission to discuss obesity behavioral modification therapy today.  ASSESS: [name] has the diagnosis of obesity and her BMI today is  35.44 [name] is in the action stage of change   ADVISE: [name] was educated on the multiple health risks of obesity as well as the benefit of weight loss to improve her health. She was advised of the need for long term treatment and the importance of lifestyle modifications.  AGREE: Multiple dietary modification options and treatment options were discussed and  [name] agreed to the above obesity treatment plan.  I, Trixie Dredge, am acting as transcriptionist for Dennard Nip, MD  I have reviewed the above documentation for accuracy and completeness, and I agree with the above. -Dennard Nip, MD

## 2017-04-05 NOTE — Telephone Encounter (Signed)
Plenvu script resent to [facility] on [address]  Hulen Skains and informed pt.  She expressed understanding and appreciation.

## 2017-04-06 ENCOUNTER — Other Ambulatory Visit (INDEPENDENT_AMBULATORY_CARE_PROVIDER_SITE_OTHER): Payer: Self-pay

## 2017-04-06 DIAGNOSIS — F3289 Other specified depressive episodes: Secondary | ICD-10-CM

## 2017-04-06 MED ORDER — BUPROPION HCL ER (SR) 150 MG PO TB12: 150.0000 mg | ORAL_TABLET | Freq: Every day | ORAL | 0 refills | Status: DC

## 2017-04-08 DIAGNOSIS — I10 Essential (primary) hypertension: Secondary | ICD-10-CM | POA: Diagnosis not present

## 2017-04-08 DIAGNOSIS — R82998 Other abnormal findings in urine: Secondary | ICD-10-CM | POA: Diagnosis not present

## 2017-04-08 DIAGNOSIS — R7301 Impaired fasting glucose: Secondary | ICD-10-CM | POA: Diagnosis not present

## 2017-04-08 DIAGNOSIS — M859 Disorder of bone density and structure, unspecified: Secondary | ICD-10-CM | POA: Diagnosis not present

## 2017-04-08 DIAGNOSIS — E7849 Other hyperlipidemia: Secondary | ICD-10-CM | POA: Diagnosis not present

## 2017-04-14 DIAGNOSIS — Z0000 Encounter for general adult medical examination without abnormal findings: Secondary | ICD-10-CM | POA: Diagnosis not present

## 2017-04-14 DIAGNOSIS — Z1389 Encounter for screening for other disorder: Secondary | ICD-10-CM | POA: Diagnosis not present

## 2017-04-14 DIAGNOSIS — Z6835 Body mass index (BMI) 35.0-35.9, adult: Secondary | ICD-10-CM | POA: Diagnosis not present

## 2017-04-14 DIAGNOSIS — E7849 Other hyperlipidemia: Secondary | ICD-10-CM | POA: Diagnosis not present

## 2017-04-14 DIAGNOSIS — R7301 Impaired fasting glucose: Secondary | ICD-10-CM | POA: Diagnosis not present

## 2017-04-14 DIAGNOSIS — H8112 Benign paroxysmal vertigo, left ear: Secondary | ICD-10-CM | POA: Diagnosis not present

## 2017-04-14 DIAGNOSIS — M8580 Other specified disorders of bone density and structure, unspecified site: Secondary | ICD-10-CM | POA: Diagnosis not present

## 2017-04-14 DIAGNOSIS — I10 Essential (primary) hypertension: Secondary | ICD-10-CM | POA: Diagnosis not present

## 2017-04-14 DIAGNOSIS — H8110 Benign paroxysmal vertigo, unspecified ear: Secondary | ICD-10-CM | POA: Diagnosis not present

## 2017-04-14 DIAGNOSIS — I728 Aneurysm of other specified arteries: Secondary | ICD-10-CM | POA: Diagnosis not present

## 2017-04-14 DIAGNOSIS — J45909 Unspecified asthma, uncomplicated: Secondary | ICD-10-CM | POA: Diagnosis not present

## 2017-04-14 DIAGNOSIS — H903 Sensorineural hearing loss, bilateral: Secondary | ICD-10-CM | POA: Diagnosis not present

## 2017-04-14 DIAGNOSIS — R42 Dizziness and giddiness: Secondary | ICD-10-CM | POA: Diagnosis not present

## 2017-04-14 DIAGNOSIS — H02402 Unspecified ptosis of left eyelid: Secondary | ICD-10-CM | POA: Diagnosis not present

## 2017-04-14 DIAGNOSIS — E668 Other obesity: Secondary | ICD-10-CM | POA: Diagnosis not present

## 2017-04-14 DIAGNOSIS — H532 Diplopia: Secondary | ICD-10-CM | POA: Diagnosis not present

## 2017-04-14 DIAGNOSIS — H9312 Tinnitus, left ear: Secondary | ICD-10-CM | POA: Diagnosis not present

## 2017-04-15 ENCOUNTER — Other Ambulatory Visit: Payer: Self-pay

## 2017-04-15 ENCOUNTER — Encounter: Payer: Self-pay | Admitting: Gastroenterology

## 2017-04-15 ENCOUNTER — Ambulatory Visit (AMBULATORY_SURGERY_CENTER): Payer: Medicare Other | Admitting: Gastroenterology

## 2017-04-15 VITALS — BP 141/80 | HR 81 | Temp 98.0°F | Resp 19 | Ht 63.0 in | Wt 201.0 lb

## 2017-04-15 DIAGNOSIS — E669 Obesity, unspecified: Secondary | ICD-10-CM | POA: Diagnosis not present

## 2017-04-15 DIAGNOSIS — G8929 Other chronic pain: Secondary | ICD-10-CM | POA: Diagnosis not present

## 2017-04-15 DIAGNOSIS — R1011 Right upper quadrant pain: Secondary | ICD-10-CM

## 2017-04-15 DIAGNOSIS — R194 Change in bowel habit: Secondary | ICD-10-CM | POA: Diagnosis not present

## 2017-04-15 DIAGNOSIS — Z1211 Encounter for screening for malignant neoplasm of colon: Secondary | ICD-10-CM

## 2017-04-15 DIAGNOSIS — J45909 Unspecified asthma, uncomplicated: Secondary | ICD-10-CM | POA: Diagnosis not present

## 2017-04-15 DIAGNOSIS — I10 Essential (primary) hypertension: Secondary | ICD-10-CM | POA: Diagnosis not present

## 2017-04-15 MED ORDER — SODIUM CHLORIDE 0.9 % IV SOLN: 500.0000 mL | INTRAVENOUS | Status: DC

## 2017-04-15 MED ORDER — SODIUM CHLORIDE 0.9 % IV SOLN
500.0000 mL | INTRAVENOUS | Status: DC
Start: 2017-04-15 — End: 2018-01-31

## 2017-04-15 NOTE — Progress Notes (Signed)
Called to room to assist during endoscopic procedure.  Patient ID and intended procedure confirmed with present staff. Received instructions for my participation in the procedure from the performing physician.  

## 2017-04-15 NOTE — Progress Notes (Signed)
To recovery, report to RN, VSS. 

## 2017-04-15 NOTE — Op Note (Signed)
[place] Patient Name: [name] Procedure Date: 04/15/2017 1:51 PM MRN: [id number] Endoscopist: Remo Lipps P. Armbruster MD, MD Age: 72 Referring MD:  Date of Birth: 01-08-1946 Gender: Female Account #: [id number] Procedure:                Upper GI endoscopy Indications:              Upper abdominal pain, history of gastric sleeve Medicines:                Monitored Anesthesia Care Procedure:                Pre-Anesthesia Assessment:                           - Prior to the procedure, a History and Physical                            was performed, and patient medications and                            allergies were reviewed. The patient's tolerance of                            previous anesthesia was also reviewed. The risks                            and benefits of the procedure and the sedation                            options and risks were discussed with the patient.                            All questions were answered, and informed consent                            was obtained. Prior Anticoagulants: The patient has                            taken no previous anticoagulant or antiplatelet                            agents. ASA Grade Assessment: II - A patient with                            mild systemic disease. After reviewing the risks                            and benefits, the patient was deemed in                            satisfactory condition to undergo the procedure.                           After obtaining informed consent, the endoscope was  passed under direct vision. Throughout the                            procedure, the patient's blood pressure, pulse, and                            oxygen saturations were monitored continuously. The                            Endoscope was introduced through the mouth, and                            advanced to the second part of duodenum. The upper    GI endoscopy was accomplished without difficulty.                            The patient tolerated the procedure well. Scope In: Scope Out: Findings:                 Esophagogastric landmarks were identified: the                            Z-line was found at 36 cm, the gastroesophageal                            junction was found at 36 cm and the upper extent of                            the gastric folds was found at 36 cm from the                            incisors.                           The exam of the esophagus was otherwise normal.                           Evidence of a sleeve gastrectomy was found in the                            stomach. This was characterized by healthy                            appearing mucosa.                           Patchy mildly erythematous mucosa was found in the                            gastric antrum.                           The exam of the stomach was otherwise normal.  Biopsies were taken with a cold forceps in the                            gastric body, at the incisura and in the gastric                            antrum for Helicobacter pylori testing.                           The duodenal bulb and second portion of the                            duodenum were normal. Complications:            No immediate complications. Estimated blood loss:                            Minimal. Estimated Blood Loss:     Estimated blood loss was minimal. Impression:               - Esophagogastric landmarks identified.                           - Normal esophagus                           - A sleeve gastrectomy was found, characterized by                            healthy appearing mucosa.                           - Erythematous mucosa in the antrum.                           - Normal duodenal bulb and second portion of the                            duodenum.                           - Biopsies were taken with a cold  forceps for                            Helicobacter pylori testing. Recommendation:           - Patient has a contact number available for                            emergencies. The signs and symptoms of potential                            delayed complications were discussed with the                            patient. Return to normal activities tomorrow.  Written discharge instructions were provided to the                            patient.                           - Resume previous diet.                           - Continue present medications.                           - Await pathology results with further                            recommendations                           - Consideration for an enterography study if                            postprandial pain persists given prior CT results                            roughly one year ago (which showed prominence of                            small bowel loops). CT scan would preferentially be                            done at the time of maximal symptoms Steven P. Armbruster MD, MD 04/15/2017 2:36:04 PM This report has been signed electronically.

## 2017-04-15 NOTE — Op Note (Signed)
[place] Patient Name: [name] Procedure Date: 04/15/2017 1:50 PM MRN: [id number] Endoscopist: Remo Lipps P. Armbruster MD, MD Age: 72 Referring MD:  Date of Birth: Apr 02, 1945 Gender: Female Account #: [id number] Procedure:                Colonoscopy Indications:              Screening for colorectal malignant neoplasm,                            patient otherwise endorses periodic loose stools                            with leakage Medicines:                Monitored Anesthesia Care Procedure:                Pre-Anesthesia Assessment:                           - Prior to the procedure, a History and Physical                            was performed, and patient medications and                            allergies were reviewed. The patient's tolerance of                            previous anesthesia was also reviewed. The risks                            and benefits of the procedure and the sedation                            options and risks were discussed with the patient.                            All questions were answered, and informed consent                            was obtained. Prior Anticoagulants: The patient has                            taken no previous anticoagulant or antiplatelet                            agents. ASA Grade Assessment: II - A patient with                            mild systemic disease. After reviewing the risks                            and benefits, the patient was deemed in  satisfactory condition to undergo the procedure.                           After obtaining informed consent, the colonoscope                            was passed under direct vision. Throughout the                            procedure, the patient's blood pressure, pulse, and                            oxygen saturations were monitored continuously. The                            Colonoscope was introduced through the anus  and                            advanced to the the terminal ileum, with                            identification of the appendiceal orifice and IC                            valve. The colonoscopy was performed without                            difficulty. The patient tolerated the procedure                            well. The quality of the bowel preparation was                            adequate. The terminal ileum, ileocecal valve,                            appendiceal orifice, and rectum were photographed. Scope In: 2:06:27 PM Scope Out: 2:25:04 PM Scope Withdrawal Time: 0 hours 15 minutes 44 seconds  Total Procedure Duration: 0 hours 18 minutes 37 seconds  Findings:                 The perianal and digital rectal examinations were                            normal.                           The terminal ileum appeared normal.                           Many small and large-mouthed diverticula were found                            in the entire colon.  The bowel preparation was only fair upon insertion,                            several minutes taken to lavage the colon, at one                            point clogged the endoscope and had to be                            withdrawn, but following lavage the prep was                            adequate. The exam was otherwise without                            abnormality on direct and retroflexion views. No                            inflammatory changes.                           Biopsies for histology were taken with a cold                            forceps from the right colon, left colon and                            transverse colon for evaluation of microscopic                            colitis. Complications:            No immediate complications. Estimated blood loss:                            Minimal. Estimated Blood Loss:     Estimated blood loss was minimal. Impression:               -  The examined portion of the ileum was normal.                           - Diverticulosis in the entire examined colon.                           - The examination was otherwise normal on direct                            and retroflexion views.                           - Biopsies were taken with a cold forceps from the                            right colon, left colon and transverse colon for  evaluation of microscopic colitis. Recommendation:           - Patient has a contact number available for                            emergencies. The signs and symptoms of potential                            delayed complications were discussed with the                            patient. Return to normal activities tomorrow.                            Written discharge instructions were provided to the                            patient.                           - Resume previous diet.                           - Continue present medications.                           - Await pathology results.                           - No repeat screening colonoscopy is needed (would                            not be due until age 69, at which point most                            patients stop screening exams) Remo Lipps P. Armbruster MD, MD 04/15/2017 2:30:36 PM This report has been signed electronically.

## 2017-04-15 NOTE — Patient Instructions (Addendum)
YOU HAD AN ENDOSCOPIC PROCEDURE TODAY AT THE [facility]:   Refer to the procedure report that was given to you for any specific questions about what was found during the examination.  If the procedure report does not answer your questions, please call your gastroenterologist to clarify.  If you requested that your care partner not be given the details of your procedure findings, then the procedure report has been included in a sealed envelope for you to review at your convenience later.  YOU SHOULD EXPECT: Some feelings of bloating in the abdomen. Passage of more gas than usual.  Walking can help get rid of the air that was put into your GI tract during the procedure and reduce the bloating. If you had a lower endoscopy (such as a colonoscopy or flexible sigmoidoscopy) you may notice spotting of blood in your stool or on the toilet paper. If you underwent a bowel prep for your procedure, you may not have a normal bowel movement for a few days.  Please Note:  You might notice some irritation and congestion in your nose or some drainage.  This is from the oxygen used during your procedure.  There is no need for concern and it should clear up in a day or so.  SYMPTOMS TO REPORT IMMEDIATELY:   Following lower endoscopy (colonoscopy or flexible sigmoidoscopy):  Excessive amounts of blood in the stool  Significant tenderness or worsening of abdominal pains  Swelling of the abdomen that is new, acute  Fever of 100F or higher   Following upper endoscopy (EGD)  Vomiting of blood or coffee ground material  New chest pain or pain under the shoulder blades  Painful or persistently difficult swallowing  New shortness of breath  Fever of 100F or higher  Black, tarry-looking stools  For urgent or emergent issues, a gastroenterologist can be reached at any hour by calling [phone].   DIET:  We do recommend a small meal at first, but then you may proceed to your regular diet.  Drink  plenty of fluids but you should avoid alcoholic beverages for 24 hours.  MEDICATIONS: Continue present medications.  Please see handouts given to you by your recovery nurse.  ACTIVITY:  You should plan to take it easy for the rest of today and you should NOT DRIVE or use heavy machinery until tomorrow (because of the sedation medicines used during the test).    FOLLOW UP: Our staff will call the number listed on your records the next business day following your procedure to check on you and address any questions or concerns that you may have regarding the information given to you following your procedure. If we do not reach you, we will leave a message.  However, if you are feeling well and you are not experiencing any problems, there is no need to return our call.  We will assume that you have returned to your regular daily activities without incident.  If any biopsies were taken you will be contacted by phone or by letter within the next 1-3 weeks.  Please call us at [phone] if you have not heard about the biopsies in 3 weeks.   Thank you for allowing us to provide for your healthcare needs today.   SIGNATURES/CONFIDENTIALITY: You and/or your care partner have signed paperwork which will be entered into your electronic medical record.  These signatures attest to the fact that that the information above on your After Visit Summary has been reviewed and is   understood.  Full responsibility of the confidentiality of this discharge information lies with you and/or your care-partner. 

## 2017-04-18 ENCOUNTER — Telehealth: Payer: Self-pay | Admitting: *Deleted

## 2017-04-18 ENCOUNTER — Telehealth: Payer: Self-pay

## 2017-04-18 NOTE — Telephone Encounter (Signed)
No answer, message left for the patient. 

## 2017-04-18 NOTE — Telephone Encounter (Signed)
  Follow up Call-  Call back number 04/15/2017  Post procedure Call Back phone  # [phone]  Permission to leave phone message Yes  Some recent data might be hidden     Patient questions:  Do you have a fever, pain , or abdominal swelling? No. Pain Score  0 *  Have you tolerated food without any problems? Yes.    Have you been able to return to your normal activities? Yes.    Do you have any questions about your discharge instructions: Diet   No. Medications  No. Follow up visit  No.  Do you have questions or concerns about your Care? No.  Actions: * If pain score is 4 or above: No action needed, pain <4.

## 2017-04-19 ENCOUNTER — Encounter: Payer: Self-pay | Admitting: Gastroenterology

## 2017-04-20 ENCOUNTER — Ambulatory Visit (INDEPENDENT_AMBULATORY_CARE_PROVIDER_SITE_OTHER): Payer: Medicare Other | Admitting: Family Medicine

## 2017-04-20 VITALS — BP 115/74 | HR 82 | Temp 97.7°F | Ht 63.0 in | Wt 197.0 lb

## 2017-04-20 DIAGNOSIS — Z6834 Body mass index (BMI) 34.0-34.9, adult: Secondary | ICD-10-CM

## 2017-04-20 DIAGNOSIS — F3289 Other specified depressive episodes: Secondary | ICD-10-CM | POA: Diagnosis not present

## 2017-04-20 DIAGNOSIS — E669 Obesity, unspecified: Secondary | ICD-10-CM

## 2017-04-20 NOTE — Progress Notes (Signed)
Office: [phone]  /  Fax: [phone]   HPI:   Chief Complaint: OBESITY [name] is here to discuss her progress with her obesity treatment plan. [name] is on the  keep a food journal with 1300-1400 calories and 75 grams of  protein daily and is following her eating plan approximately 98 % of the time. [name] states [name] is walking 15-30 minutes 3-4 times per week. [name] is meeting caloric requirement. [name] felt the Wellbutrin has really helped. [name] hit her protein goal all but 3 days. [name] is trying to plan for success for husband's surgery tomorrow.  Her weight is 197 lb (89.4 kg) today and has had a weight loss of 3 pounds over a period of 2 weeks since her last visit. [name] has gained 4 lbs since starting treatment with [place].  Depression with emotional eating behaviors [name] is struggling with emotional eating and using food for comfort to the extent that it is negatively impacting her health. [name] often snacks when [name] is not hungry. [name] sometimes feels [name] is out of control and then feels guilty that [name] made poor food choices. [name] has been working on behavior modification techniques to help reduce her emotional eating and has been somewhat successful. [name] has felt more in control of her cravings since being on Wellbutrin. [name] shows no sign of suicidal or homicidal ideations. Her blood pressure was controlled today.   Depression screen [name] 2/9 10/08/2016 02/04/2016 11/05/2015 08/06/2015 05/09/2015  Decreased Interest 1 0 0 0 0  Down, Depressed, Hopeless 1 0 0 0 0  PHQ - 2 Score 2 0 0 0 0  Altered sleeping 0 - - - -  Tired, decreased energy 0 - - - -  Change in appetite 0 - - - -  Feeling bad or failure about yourself  0 - - - -  Trouble concentrating 0 - - - -  Moving slowly or fidgety/restless 0 - - - -  Suicidal thoughts 0 - - - -  PHQ-9 Score 2 - - - -     ALLERGIES: Allergies  Allergen Reactions  . Ativan [Lorazepam] Other (See Comments)    Agitation, aggressive actions,  significant mental changes  . Buprenorphine Hcl Itching and Rash  . Fentanyl Itching  . Morphine And Related Itching and Rash    MEDICATIONS: Current Outpatient Medications on File Prior to Visit  Medication Sig Dispense Refill  . acetaminophen (TYLENOL) 500 MG tablet Take 1,000 mg by mouth every 6 (six) hours as needed (for pain.).     [name] albuterol (PROVENTIL HFA;VENTOLIN HFA) 108 (90 Base) MCG/ACT inhaler Inhale 1-2 puffs into the lungs every 6 (six) hours as needed for shortness of breath or wheezing.    . Biotin 10000 MCG TABS Take 10,000 mcg by mouth at bedtime.     [name] buPROPion (WELLBUTRIN SR) 150 MG 12 hr tablet Take 1 tablet (150 mg total) by mouth daily. 30 tablet 0  . cetirizine (ZYRTEC) 10 MG chewable tablet Chew 10 mg by mouth daily.    . cholecalciferol (VITAMIN D) 1000 units tablet Take 1 tablet (1,000 Units total) by mouth 2 (two) times daily. 60 tablet 0  . clorazepate (TRANXENE) 7.5 MG tablet Take 7.5 mg by mouth 3 (three) times daily as needed for anxiety.    [name] diltiazem (CARTIA XT) 180 MG 24 hr capsule Take 1 capsule (180 mg total) by mouth daily. 30 capsule 11  . DULoxetine (CYMBALTA) 60 MG capsule Take 60 mg by mouth  daily with supper.     . methylcellulose oral powder Take 1 packet by mouth daily.    . Multiple Vitamin (MULTIVITAMIN WITH MINERALS) TABS tablet Take 1 tablet by mouth 2 (two) times daily. Centrum    . pantoprazole (PROTONIX) 40 MG tablet Take 1 tablet (40 mg total) by mouth every morning. 90 tablet 3  . Probiotic Product (RESTORA) CAPS Take 1 capsule by mouth daily. (Patient taking differently: Take 1 capsule by mouth daily with supper. ) 30 capsule 2  . simvastatin (ZOCOR) 20 MG tablet Take 1 tablet (20 mg total) by mouth daily. 30 tablet 11  . sucralfate (CARAFATE) 1 g tablet Take 1 tablet (1 g total) by mouth 4 (four) times daily -  with meals and at bedtime. May dissolve tablet in 1 tablespoon of water to take as a slurry 120 tablet 0  . traMADol (ULTRAM)  50 MG tablet Take 50 mg by mouth every 6 (six) hours as needed (for pain.).     [name] vitamin B-12 (CYANOCOBALAMIN) 1000 MCG tablet Take 1,000 mcg by mouth daily.     Current Facility-Administered Medications on File Prior to Visit  Medication Dose Route Frequency Provider Last Rate Last Dose  . 0.9 %  sodium chloride infusion  500 mL Intravenous Continuous Armbruster, Carlota Raspberry, MD      . 0.9 %  sodium chloride infusion  500 mL Intravenous Continuous Armbruster, Carlota Raspberry, MD        PAST MEDICAL HISTORY: Past Medical History:  Diagnosis Date  . Allergy   . Anemia   . Antral polyp    benign  . Anxiety   . Arthritis   . Asthma   . B12 deficiency   . Back pain   . Chest pain   . Constipation   . Depression   . Diverticulosis   . Gallbladder problem   . GERD (gastroesophageal reflux disease) 09/09/2011  . History of transfusion   . HTN (hypertension) 09/09/2011  . Hyperlipemia 09/09/2011  . Hypertension   . IBS (irritable bowel syndrome)   . Internal hemorrhoid   . Joint pain   . Obesity   . Osteoarthritis   . Paroxysmal atrial fibrillation (HCC)   . PONV (postoperative nausea and vomiting)    with big surgeries, none with endos etc, with colonoscopy- 5-6 years ago could not swallow   . Shortness of breath   . Sleep apnea    borderline per patient no cpap   . SOB (shortness of breath) on exertion   . Swallowing difficulty   . Tachycardia   . Unstable angina ([place]) 09/09/2011  . Vitamin D deficiency   . Vocal cord dysfunction 2013   swelling    PAST SURGICAL HISTORY: Past Surgical History:  Procedure Laterality Date  . ABDOMINAL HYSTERECTOMY  1988  . ANKLE FUSION Right    x 2  . BACK SURGERY  [phone]   3 ruptured discs, lower back  . BILATERAL ANTERIOR TOTAL HIP ARTHROPLASTY Bilateral 11/12/2014   Procedure: BILATERAL ANTERIOR TOTAL HIP ARTHROPLASTY;  Surgeon: Paralee Cancel, MD;  Location: [facility];  Service: Orthopedics;  Laterality: Bilateral;  . BRAVO [place] STUDY [name]  03/06/2013   Procedure: BRAVO [place] STUDY;  Surgeon: Inda Castle, MD;  Location: [facility];  Service: Endoscopy;  Laterality: [name];  . CARDIAC CATHETERIZATION  2013   NORMAL  . CHOLECYSTECTOMY    . COLONOSCOPY    . ELBOW SURGERY Left    removed bone chip  .  ESOPHAGOGASTRODUODENOSCOPY [name] 03/06/2013   Procedure: ESOPHAGOGASTRODUODENOSCOPY (EGD);  Surgeon: Inda Castle, MD;  Location: [facility];  Service: Endoscopy;  Laterality: [name];  . FOOT OSTEOTOMY W/ PLANTAR FASCIA RELEASE Left   . ILIOTIBIAL BAND RELEASE Right 1997  . JOINT REPLACEMENT    . LAPAROSCOPIC GASTRIC SLEEVE RESECTION WITH HIATAL HERNIA REPAIR  08/20/2014   Procedure: LAPAROSCOPIC GASTRIC SLEEVE RESECTION WITH HIATAL HERNIA  REPAIR AND  UPPER ENDOSCOPY;  Surgeon: Johnathan Hausen, MD;  Location: [facility];  Service: General;;  . LEFT HEART CATHETERIZATION WITH CORONARY ANGIOGRAM Bilateral 09/10/2011   Procedure: LEFT HEART CATHETERIZATION WITH CORONARY ANGIOGRAM;  Surgeon: Lorretta Harp, MD;  Location: [facility];  Service: Cardiovascular;  Laterality: Bilateral;  . NASAL SINUS SURGERY  1997  . REVERSE SHOULDER ARTHROPLASTY Right 05/14/2016   Procedure: RIGHT REVERSE SHOULDER ARTHROPLASTY;  Surgeon: Netta Cedars, MD;  Location: [facility];  Service: Orthopedics;  Laterality: Right;  . SHOULDER SURGERY Left   . tfc wrist Left 1997  . thumb replacement Bilateral    one 2009 and one 2014  . TOTAL ANKLE REPLACEMENT Right    x 3  . TOTAL ANKLE REPLACEMENT Left   . TOTAL ANKLE REPLACEMENT    . TOTAL HIP ARTHROPLASTY Bilateral   . TOTAL KNEE ARTHROPLASTY Bilateral    left 2002, right 2012  . TOTAL SHOULDER ARTHROPLASTY Right 05/14/2016  . TRANSTHORACIC ECHOCARDIOGRAM  09/09/2011   MILD CONCENTRIC LVH. TRACE MITRAL REGURG. TRACE TR. MILD AORTIC REGURG.  . 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 UPPER GI ENDOSCOPY  08/20/2014   Procedure: UPPER GI ENDOSCOPY;  Surgeon: Johnathan Hausen, MD;  Location: [facility];  Service:  General;;  . WRIST SURGERY Left 1997, 2009     SOCIAL HISTORY: Social History   Tobacco Use  . Smoking status: Never Smoker  . Smokeless tobacco: Never Used  Substance Use Topics  . Alcohol use: No  . Drug use: No    FAMILY HISTORY: Family History  Problem Relation Age of Onset  . Hypertension Mother   . Diabetes Mother   . Arthritis Mother   . Esophageal cancer Mother   . Cancer Mother   . Esophageal cancer Father 25  . Cancer Father   . Alcoholism Father   . Other Sister        kidney mass removed  . Hypertension Sister   . Heart disease Maternal Grandmother   . Stroke Paternal Grandmother   . Coronary artery disease Neg Hx   . Colon cancer Neg Hx   . Liver disease Neg Hx   . Kidney disease Neg Hx   . Rectal cancer Neg Hx   . Stomach cancer Neg Hx     ROS: Review of Systems  Constitutional: Positive for weight loss.  Psychiatric/Behavioral: Positive for depression. Negative for suicidal ideas.    PHYSICAL EXAM: Blood pressure 115/74, pulse 82, temperature 97.7 F (36.5 C), temperature source Oral, height 5\' 3"  (1.6 m), weight 197 lb (89.4 kg), SpO2 99 %. Body mass index is 34.9 kg/m. Physical Exam  Constitutional: [name] is oriented to person, place, and time. [name] appears well-developed and well-nourished.  HENT:  Head: Normocephalic.  Eyes: EOM are normal.  Neck: Normal range of motion.  Cardiovascular: Normal rate.  Pulmonary/Chest: Effort normal.  Musculoskeletal: Normal range of motion.  Neurological: [name] is alert and oriented to person, place, and time.  Skin: Skin is warm and dry.  Psychiatric: [name] has a normal mood  and affect. Her behavior is normal.    RECENT LABS AND TESTS: BMET    Component Value Date/Time   NA 143 03/04/2017 1109   NA 146 (H) 02/15/2017 1207   K 3.8 03/04/2017 1109   CL 108 03/04/2017 1109   CO2 30 03/04/2017 1109   GLUCOSE 76 03/04/2017 1109   BUN 18 03/04/2017 1109   BUN 15 02/15/2017 1207   CREATININE 0.99  03/04/2017 1109   CALCIUM 8.8 03/04/2017 1109   GFRNONAA 77 02/15/2017 1207   GFRAA 89 02/15/2017 1207   Lab Results  Component Value Date   HGBA1C 5.4 10/08/2016   HGBA1C 5.8 (H) 10/13/2014   Lab Results  Component Value Date   INSULIN 8.2 02/15/2017   INSULIN 9.8 10/08/2016   CBC    Component Value Date/Time   WBC 4.2 03/04/2017 1109   RBC 4.09 03/04/2017 1109   HGB 12.7 03/04/2017 1109   HGB 13.0 02/15/2017 1207   HCT 39.0 03/04/2017 1109   HCT 40.0 02/15/2017 1207   PLT 183.0 03/04/2017 1109   MCV 95.3 03/04/2017 1109   MCV 94 02/15/2017 1207   MCH 30.4 02/15/2017 1207   MCH 31.7 05/10/2016 1304   MCHC 32.7 03/04/2017 1109   RDW 13.1 03/04/2017 1109   RDW 13.3 02/15/2017 1207   LYMPHSABS 1.4 03/04/2017 1109   LYMPHSABS 1.4 02/15/2017 1207   MONOABS 0.3 03/04/2017 1109   EOSABS 0.1 03/04/2017 1109   EOSABS 0.1 02/15/2017 1207   BASOSABS 0.0 03/04/2017 1109   BASOSABS 0.0 02/15/2017 1207   Iron/TIBC/Ferritin/ %Sat No results found for: IRON, TIBC, FERRITIN, IRONPCTSAT Lipid Panel     Component Value Date/Time   CHOL 204 (H) 02/15/2017 1207   TRIG 115 02/15/2017 1207   HDL 84 02/15/2017 1207   LDLCALC 97 02/15/2017 1207   Hepatic Function Panel     Component Value Date/Time   PROT 6.0 03/04/2017 1109   PROT 5.8 (L) 02/15/2017 1207   ALBUMIN 3.8 03/04/2017 1109   ALBUMIN 4.1 02/15/2017 1207   AST 22 03/04/2017 1109   ALT 14 03/04/2017 1109   ALKPHOS 78 03/04/2017 1109   BILITOT 0.5 03/04/2017 1109   BILITOT 0.3 02/15/2017 1207      Component Value Date/Time   TSH 1.60 09/30/2016   TSH 0.771 10/13/2014 1700   TSH 1.37 08/03/2013 1421    ASSESSMENT AND PLAN: Other depression - with emotional eating  Class 1 obesity with serious comorbidity and body mass index (BMI) of 34.0 to 34.9 in adult, unspecified obesity type  PLAN:  Depression with Emotional Eating Behaviors We discussed behavior modification techniques today to help [name] deal with  her emotional eating and depression. [name] has agreed to continue Wellbutrin SR 150 mg qd and agreed to follow up as directed.  Obesity [name] is currently in the action stage of change. As such, her goal is to continue with weight loss efforts [name] has agreed to keep a food journal with 1300-1400 calories and 75 grams of protein daily  [name] has been instructed to work up to a goal of 150 minutes of combined cardio and strengthening exercise per week for weight loss and overall health benefits. We discussed the following Behavioral Modification Stratagies today: work on meal planning and easy cooking plans, planning for success, and keep a strict food journal.     [name] has agreed to follow up with our clinic in 2 weeks. [name] was informed of the importance of frequent follow up visits to  maximize her success with intensive lifestyle modifications for her multiple health conditions.   OBESITY BEHAVIORAL INTERVENTION VISIT  Today's visit was # 9 out of 22.  Starting weight: 193 lbs  Starting date: 04/20/17 Today's weight : 197 lbs Today's date: 04/20/2017 Total lbs lost to date: 0 (Patients must lose 7 lbs in the first 6 months to continue with counseling)   ASK: We discussed the diagnosis of obesity with [name] today and [name] agreed to give [place] permission to discuss obesity behavioral modification therapy today.  ASSESS: [name] has the diagnosis of obesity and her BMI today is 34.91 [name] is in the action stage of change   ADVISE: [name] was educated on the multiple health risks of obesity as well as the benefit of weight loss to improve her health. [name] was advised of the need for long term treatment and the importance of lifestyle modifications.  AGREE: Multiple dietary modification options and treatment options were discussed and  [name] agreed to the above obesity treatment plan.  I, Renee Ramus, am acting as transcriptionist for Dennard Nip, MD  I  have reviewed the above documentation for accuracy and completeness, and I agree with the above. -Dennard Nip, MD

## 2017-05-09 ENCOUNTER — Ambulatory Visit (INDEPENDENT_AMBULATORY_CARE_PROVIDER_SITE_OTHER): Payer: Medicare Other | Admitting: Family Medicine

## 2017-05-09 VITALS — BP 107/74 | HR 86 | Temp 97.6°F | Ht 63.0 in | Wt 198.0 lb

## 2017-05-09 DIAGNOSIS — F3289 Other specified depressive episodes: Secondary | ICD-10-CM

## 2017-05-09 DIAGNOSIS — M19041 Primary osteoarthritis, right hand: Secondary | ICD-10-CM | POA: Diagnosis not present

## 2017-05-09 DIAGNOSIS — Z6835 Body mass index (BMI) 35.0-35.9, adult: Secondary | ICD-10-CM | POA: Diagnosis not present

## 2017-05-09 MED ORDER — BUPROPION HCL ER (SR) 200 MG PO TB12: 200.0000 mg | ORAL_TABLET | Freq: Two times a day (BID) | ORAL | 0 refills | Status: DC

## 2017-05-09 NOTE — Progress Notes (Signed)
Office: [phone]  /  Fax: [phone]   HPI:   Chief Complaint: OBESITY [name] is here to discuss her progress with her obesity treatment plan. She is on the keep a food journal with 1300 to 1400 calories and 75 grams of protein daily and is following her eating plan approximately 50 % of the time. She states she is walking 30 minutes 1 to 2 times per week. [name] is journaling on and off, but she is especially unlikely to journal if she indulges. She eats out frequently and guesstimates much of her food. Her weight is 198 lb (89.8 kg) today and has had a weight gain of 1 pound over a period of 3 weeks since her last visit. She has gained 5 lbs since starting treatment with [place].  Depression with emotional eating behaviors [name] has been doing well on wellbutrin, but has increased stress and emotional eating. [name] struggles with emotional eating and using food for comfort to the extent that it is negatively impacting her health. She often snacks when she is not hungry. [name] sometimes feels she is out of control and then feels guilty that she made poor food choices. She has been working on behavior modification techniques to help reduce her emotional eating and has been somewhat successful. She shows no sign of suicidal or homicidal ideations.  Depression screen [name] 2/9 10/08/2016 02/04/2016 11/05/2015 08/06/2015 05/09/2015  Decreased Interest 1 0 0 0 0  Down, Depressed, Hopeless 1 0 0 0 0  PHQ - 2 Score 2 0 0 0 0  Altered sleeping 0 - - - -  Tired, decreased energy 0 - - - -  Change in appetite 0 - - - -  Feeling bad or failure about yourself  0 - - - -  Trouble concentrating 0 - - - -  Moving slowly or fidgety/restless 0 - - - -  Suicidal thoughts 0 - - - -  PHQ-9 Score 2 - - - -     ALLERGIES: Allergies  Allergen Reactions  . Ativan [Lorazepam] Other (See Comments)    Agitation, aggressive actions, significant mental changes  . Buprenorphine Hcl Itching and Rash  .  Fentanyl Itching  . Morphine And Related Itching and Rash    MEDICATIONS: Current Outpatient Medications on File Prior to Visit  Medication Sig Dispense Refill  . acetaminophen (TYLENOL) 500 MG tablet Take 1,000 mg by mouth every 6 (six) hours as needed (for pain.).     [name] albuterol (PROVENTIL HFA;VENTOLIN HFA) 108 (90 Base) MCG/ACT inhaler Inhale 1-2 puffs into the lungs every 6 (six) hours as needed for shortness of breath or wheezing.    . Biotin 10000 MCG TABS Take 10,000 mcg by mouth at bedtime.     . cetirizine (ZYRTEC) 10 MG chewable tablet Chew 10 mg by mouth daily.    . cholecalciferol (VITAMIN D) 1000 units tablet Take 1 tablet (1,000 Units total) by mouth 2 (two) times daily. 60 tablet 0  . clorazepate (TRANXENE) 7.5 MG tablet Take 7.5 mg by mouth 3 (three) times daily as needed for anxiety.    [name] diltiazem (CARTIA XT) 180 MG 24 hr capsule Take 1 capsule (180 mg total) by mouth daily. 30 capsule 11  . DULoxetine (CYMBALTA) 60 MG capsule Take 60 mg by mouth daily with supper.     . Methylcellulose, Laxative, 500 MG TABS Take 1 tablet by mouth daily.     . Multiple Vitamin (MULTIVITAMIN WITH MINERALS) TABS tablet Take 1 tablet  by mouth 2 (two) times daily. Centrum    . pantoprazole (PROTONIX) 40 MG tablet Take 1 tablet (40 mg total) by mouth every morning. 90 tablet 3  . Probiotic Product (RESTORA) CAPS Take 1 capsule by mouth daily. (Patient taking differently: Take 1 capsule by mouth daily with supper. ) 30 capsule 2  . simvastatin (ZOCOR) 20 MG tablet Take 1 tablet (20 mg total) by mouth daily. 30 tablet 11  . sucralfate (CARAFATE) 1 g tablet Take 1 tablet (1 g total) by mouth 4 (four) times daily -  with meals and at bedtime. May dissolve tablet in 1 tablespoon of water to take as a slurry 120 tablet 0  . traMADol (ULTRAM) 50 MG tablet Take 50 mg by mouth every 6 (six) hours as needed (for pain.).     [name] vitamin B-12 (CYANOCOBALAMIN) 1000 MCG tablet Take 1,000 mcg by mouth daily.      Current Facility-Administered Medications on File Prior to Visit  Medication Dose Route Frequency Provider Last Rate Last Dose  . 0.9 %  sodium chloride infusion  500 mL Intravenous Continuous Armbruster, Carlota Raspberry, MD      . 0.9 %  sodium chloride infusion  500 mL Intravenous Continuous Armbruster, Carlota Raspberry, MD        PAST MEDICAL HISTORY: Past Medical History:  Diagnosis Date  . Allergy   . Anemia   . Antral polyp    benign  . Anxiety   . Arthritis   . Asthma   . B12 deficiency   . Back pain   . Chest pain   . Constipation   . Depression   . Diverticulosis   . Gallbladder problem   . GERD (gastroesophageal reflux disease) 09/09/2011  . History of transfusion   . HTN (hypertension) 09/09/2011  . Hyperlipemia 09/09/2011  . Hypertension   . IBS (irritable bowel syndrome)   . Internal hemorrhoid   . Joint pain   . Obesity   . Osteoarthritis   . Paroxysmal atrial fibrillation (HCC)   . PONV (postoperative nausea and vomiting)    with big surgeries, none with endos etc, with colonoscopy- 5-6 years ago could not swallow   . Shortness of breath   . Sleep apnea    borderline per patient no cpap   . SOB (shortness of breath) on exertion   . Swallowing difficulty   . Tachycardia   . Unstable angina ([place]) 09/09/2011  . Vitamin D deficiency   . Vocal cord dysfunction 2013   swelling    PAST SURGICAL HISTORY: Past Surgical History:  Procedure Laterality Date  . ABDOMINAL HYSTERECTOMY  1988  . ANKLE FUSION Right    x 2  . BACK SURGERY  [phone]   3 ruptured discs, lower back  . BILATERAL ANTERIOR TOTAL HIP ARTHROPLASTY Bilateral 11/12/2014   Procedure: BILATERAL ANTERIOR TOTAL HIP ARTHROPLASTY;  Surgeon: Paralee Cancel, MD;  Location: [facility];  Service: Orthopedics;  Laterality: Bilateral;  . BRAVO [place] STUDY [name] 03/06/2013   Procedure: BRAVO [place] STUDY;  Surgeon: Inda Castle, MD;  Location: [facility];  Service: Endoscopy;  Laterality: [name];  . CARDIAC  CATHETERIZATION  2013   NORMAL  . CHOLECYSTECTOMY    . COLONOSCOPY    . ELBOW SURGERY Left    removed bone chip  . ESOPHAGOGASTRODUODENOSCOPY [name] 03/06/2013   Procedure: ESOPHAGOGASTRODUODENOSCOPY (EGD);  Surgeon: Inda Castle, MD;  Location: [facility];  Service: Endoscopy;  Laterality: [name];  . FOOT OSTEOTOMY W/ PLANTAR FASCIA  RELEASE Left   . ILIOTIBIAL BAND RELEASE Right 1997  . JOINT REPLACEMENT    . LAPAROSCOPIC GASTRIC SLEEVE RESECTION WITH HIATAL HERNIA REPAIR  08/20/2014   Procedure: LAPAROSCOPIC GASTRIC SLEEVE RESECTION WITH HIATAL HERNIA  REPAIR AND  UPPER ENDOSCOPY;  Surgeon: Johnathan Hausen, MD;  Location: [facility];  Service: General;;  . LEFT HEART CATHETERIZATION WITH CORONARY ANGIOGRAM Bilateral 09/10/2011   Procedure: LEFT HEART CATHETERIZATION WITH CORONARY ANGIOGRAM;  Surgeon: Lorretta Harp, MD;  Location: [facility];  Service: Cardiovascular;  Laterality: Bilateral;  . NASAL SINUS SURGERY  1997  . REVERSE SHOULDER ARTHROPLASTY Right 05/14/2016   Procedure: RIGHT REVERSE SHOULDER ARTHROPLASTY;  Surgeon: Netta Cedars, MD;  Location: [facility];  Service: Orthopedics;  Laterality: Right;  . SHOULDER SURGERY Left   . tfc wrist Left 1997  . thumb replacement Bilateral    one 2009 and one 2014  . TOTAL ANKLE REPLACEMENT Right    x 3  . TOTAL ANKLE REPLACEMENT Left   . TOTAL ANKLE REPLACEMENT    . TOTAL HIP ARTHROPLASTY Bilateral   . TOTAL KNEE ARTHROPLASTY Bilateral    left 2002, right 2012  . TOTAL SHOULDER ARTHROPLASTY Right 05/14/2016  . TRANSTHORACIC ECHOCARDIOGRAM  09/09/2011   MILD CONCENTRIC LVH. TRACE MITRAL REGURG. TRACE TR. MILD AORTIC REGURG.  . 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 UPPER GI ENDOSCOPY  08/20/2014   Procedure: UPPER GI ENDOSCOPY;  Surgeon: Johnathan Hausen, MD;  Location: [facility];  Service: General;;  . WRIST SURGERY Left 1997, 2009     SOCIAL HISTORY: Social History   Tobacco Use  . Smoking status: Never Smoker  . Smokeless  tobacco: Never Used  Substance Use Topics  . Alcohol use: No  . Drug use: No    FAMILY HISTORY: Family History  Problem Relation Age of Onset  . Hypertension Mother   . Diabetes Mother   . Arthritis Mother   . Esophageal cancer Mother   . Cancer Mother   . Esophageal cancer Father 61  . Cancer Father   . Alcoholism Father   . Other Sister        kidney mass removed  . Hypertension Sister   . Heart disease Maternal Grandmother   . Stroke Paternal Grandmother   . Coronary artery disease Neg Hx   . Colon cancer Neg Hx   . Liver disease Neg Hx   . Kidney disease Neg Hx   . Rectal cancer Neg Hx   . Stomach cancer Neg Hx     ROS: Review of Systems  Constitutional: Negative for weight loss.  Psychiatric/Behavioral: Positive for depression. Negative for suicidal ideas.       Positive for stress    PHYSICAL EXAM: Blood pressure 107/74, pulse 86, temperature 97.6 F (36.4 C), temperature source Oral, height 5\' 3"  (1.6 m), weight 198 lb (89.8 kg), SpO2 99 %. Body mass index is 35.07 kg/m. Physical Exam  Constitutional: She is oriented to person, place, and time. She appears well-developed and well-nourished.  Cardiovascular: Normal rate.  Pulmonary/Chest: Effort normal.  Musculoskeletal: Normal range of motion.  Neurological: She is oriented to person, place, and time.  Skin: Skin is warm and dry.  Psychiatric: She has a normal mood and affect. Her behavior is normal.  Vitals reviewed.   RECENT LABS AND TESTS: BMET    Component Value Date/Time   NA 143 03/04/2017 1109   NA 146 (H) 02/15/2017 1207   K 3.8 03/04/2017  1109   CL 108 03/04/2017 1109   CO2 30 03/04/2017 1109   GLUCOSE 76 03/04/2017 1109   BUN 18 03/04/2017 1109   BUN 15 02/15/2017 1207   CREATININE 0.99 03/04/2017 1109   CALCIUM 8.8 03/04/2017 1109   GFRNONAA 77 02/15/2017 1207   GFRAA 89 02/15/2017 1207   Lab Results  Component Value Date   HGBA1C 5.4 10/08/2016   HGBA1C 5.8 (H) 10/13/2014    Lab Results  Component Value Date   INSULIN 8.2 02/15/2017   INSULIN 9.8 10/08/2016   CBC    Component Value Date/Time   WBC 4.2 03/04/2017 1109   RBC 4.09 03/04/2017 1109   HGB 12.7 03/04/2017 1109   HGB 13.0 02/15/2017 1207   HCT 39.0 03/04/2017 1109   HCT 40.0 02/15/2017 1207   PLT 183.0 03/04/2017 1109   MCV 95.3 03/04/2017 1109   MCV 94 02/15/2017 1207   MCH 30.4 02/15/2017 1207   MCH 31.7 05/10/2016 1304   MCHC 32.7 03/04/2017 1109   RDW 13.1 03/04/2017 1109   RDW 13.3 02/15/2017 1207   LYMPHSABS 1.4 03/04/2017 1109   LYMPHSABS 1.4 02/15/2017 1207   MONOABS 0.3 03/04/2017 1109   EOSABS 0.1 03/04/2017 1109   EOSABS 0.1 02/15/2017 1207   BASOSABS 0.0 03/04/2017 1109   BASOSABS 0.0 02/15/2017 1207   Iron/TIBC/Ferritin/ %Sat No results found for: IRON, TIBC, FERRITIN, IRONPCTSAT Lipid Panel     Component Value Date/Time   CHOL 204 (H) 02/15/2017 1207   TRIG 115 02/15/2017 1207   HDL 84 02/15/2017 1207   LDLCALC 97 02/15/2017 1207   Hepatic Function Panel     Component Value Date/Time   PROT 6.0 03/04/2017 1109   PROT 5.8 (L) 02/15/2017 1207   ALBUMIN 3.8 03/04/2017 1109   ALBUMIN 4.1 02/15/2017 1207   AST 22 03/04/2017 1109   ALT 14 03/04/2017 1109   ALKPHOS 78 03/04/2017 1109   BILITOT 0.5 03/04/2017 1109   BILITOT 0.3 02/15/2017 1207      Component Value Date/Time   TSH 1.60 09/30/2016   TSH 0.771 10/13/2014 1700   TSH 1.37 08/03/2013 1421    ASSESSMENT AND PLAN: Other depression - Plan: buPROPion (WELLBUTRIN SR) 200 MG 12 hr tablet  Class 2 severe obesity with serious comorbidity and body mass index (BMI) of 35.0 to 35.9 in adult, unspecified obesity type ([place])  PLAN:  Depression with Emotional Eating Behaviors We discussed behavior modification techniques today to help [name] deal with her emotional eating and depression. She has agreed to increase Wellbutrin SR to 200 mg qam #30 with no refills and follow up as  directed.  Obesity [name] is currently in the action stage of change. As such, her goal is to continue with weight loss efforts She has agreed to keep a food journal with 1300 calories and 75+ grams of protein daily [name] has been instructed to work up to a goal of 150 minutes of combined cardio and strengthening exercise per week for weight loss and overall health benefits. We discussed the following Behavioral Modification Strategies today: keep a strict food journal, increasing lean protein intake and decrease eating out  [name] has agreed to follow up with our clinic in 4 weeks. She was informed of the importance of frequent follow up visits to maximize her success with intensive lifestyle modifications for her multiple health conditions.   OBESITY BEHAVIORAL INTERVENTION VISIT  Today's visit was # 9 out of 22.  Starting weight: 193 lbs Starting date: 10/08/16  Today's weight : 198 lbs Today's date: 05/09/2017 Total lbs lost to date: 0 (Patients must lose 7 lbs in the first 6 months to continue with counseling)   ASK: We discussed the diagnosis of obesity with [name] today and [name] agreed to give [place] permission to discuss obesity behavioral modification therapy today.  ASSESS: [name] has the diagnosis of obesity and her BMI today is 35.08 [name] is in the action stage of change   ADVISE: [name] was educated on the multiple health risks of obesity as well as the benefit of weight loss to improve her health. She was advised of the need for long term treatment and the importance of lifestyle modifications.  AGREE: Multiple dietary modification options and treatment options were discussed and  [name] agreed to the above obesity treatment plan.  I, Doreene Nest, am acting as transcriptionist for Dennard Nip, MD  I have reviewed the above documentation for accuracy and completeness, and I agree with the above. -Dennard Nip, MD

## 2017-05-23 DIAGNOSIS — M7731 Calcaneal spur, right foot: Secondary | ICD-10-CM | POA: Diagnosis not present

## 2017-05-23 DIAGNOSIS — Z96661 Presence of right artificial ankle joint: Secondary | ICD-10-CM | POA: Diagnosis not present

## 2017-05-26 DIAGNOSIS — Z96611 Presence of right artificial shoulder joint: Secondary | ICD-10-CM | POA: Diagnosis not present

## 2017-05-26 DIAGNOSIS — M25511 Pain in right shoulder: Secondary | ICD-10-CM | POA: Diagnosis not present

## 2017-06-01 ENCOUNTER — Other Ambulatory Visit (INDEPENDENT_AMBULATORY_CARE_PROVIDER_SITE_OTHER): Payer: Self-pay | Admitting: Family Medicine

## 2017-06-01 DIAGNOSIS — F3289 Other specified depressive episodes: Secondary | ICD-10-CM

## 2017-06-06 DIAGNOSIS — M79642 Pain in left hand: Secondary | ICD-10-CM | POA: Diagnosis not present

## 2017-06-06 DIAGNOSIS — M79641 Pain in right hand: Secondary | ICD-10-CM | POA: Diagnosis not present

## 2017-06-07 ENCOUNTER — Encounter (INDEPENDENT_AMBULATORY_CARE_PROVIDER_SITE_OTHER): Payer: Self-pay | Admitting: Family Medicine

## 2017-06-07 ENCOUNTER — Ambulatory Visit (INDEPENDENT_AMBULATORY_CARE_PROVIDER_SITE_OTHER): Payer: Medicare Other | Admitting: Family Medicine

## 2017-06-07 VITALS — BP 113/72 | HR 89 | Temp 98.2°F | Ht 63.0 in | Wt 200.0 lb

## 2017-06-07 DIAGNOSIS — Z6835 Body mass index (BMI) 35.0-35.9, adult: Secondary | ICD-10-CM

## 2017-06-07 DIAGNOSIS — F3289 Other specified depressive episodes: Secondary | ICD-10-CM | POA: Diagnosis not present

## 2017-06-07 DIAGNOSIS — R7303 Prediabetes: Secondary | ICD-10-CM | POA: Diagnosis not present

## 2017-06-07 MED ORDER — BUPROPION HCL ER (SR) 200 MG PO TB12: 200.0000 mg | ORAL_TABLET | Freq: Two times a day (BID) | ORAL | 0 refills | Status: DC

## 2017-06-07 NOTE — Progress Notes (Signed)
Office: [phone]  /  Fax: [phone]   HPI:   Chief Complaint: OBESITY [name] is here to discuss her progress with her obesity treatment plan. She is on the keep a food journal with 1300 calories and 75+ grams of protein daily and is following her eating plan approximately 35 % of the time. She states she is walking for 20 minutes 4 times per week. [name] continues to do poorly with journaling and continues to gain weight. She is eating out frequently ans she states she knows what to eat but she can't seem to make the right choices. Her husband has been bringing in cookies and candy into the house.  Her weight is 200 lb (90.7 kg) today and has gained 2 pounds since her last visit. She has lost 0 lbs since starting treatment with [place].  Depression with emotional eating behaviors [name] increased Wellbutrin SR to 200 mg q AM, still struggling with decreased motivation and emotional eating. [name] struggles with emotional eating and using food for comfort to the extent that it is negatively impacting her health. She often snacks when she is not hungry. [name] sometimes feels she is out of control and then feels guilty that she made poor food choices. She has been working on behavior modification techniques to help reduce her emotional eating and has been somewhat successful. She shows no sign of suicidal or homicidal ideations.  Depression screen [name] 2/9 10/08/2016 02/04/2016 11/05/2015 08/06/2015 05/09/2015  Decreased Interest 1 0 0 0 0  Down, Depressed, Hopeless 1 0 0 0 0  PHQ - 2 Score 2 0 0 0 0  Altered sleeping 0 - - - -  Tired, decreased energy 0 - - - -  Change in appetite 0 - - - -  Feeling bad or failure about yourself  0 - - - -  Trouble concentrating 0 - - - -  Moving slowly or fidgety/restless 0 - - - -  Suicidal thoughts 0 - - - -  PHQ-9 Score 2 - - - -   Pre-Diabetes [name] has a diagnosis of pre-diabetes based on her elevated Hgb A1c and was informed this puts her at  greater risk of developing diabetes. She has tried metformin but had 1 episode of GI upset so she stopped. She wonders if she should try it again. She denies nausea or hypoglycemia.  ALLERGIES: Allergies  Allergen Reactions  . Ativan [Lorazepam] Other (See Comments)    Agitation, aggressive actions, significant mental changes  . Buprenorphine Hcl Itching and Rash  . Fentanyl Itching  . Morphine And Related Itching and Rash    MEDICATIONS: Current Outpatient Medications on File Prior to Visit  Medication Sig Dispense Refill  . acetaminophen (TYLENOL) 500 MG tablet Take 1,000 mg by mouth every 6 (six) hours as needed (for pain.).     [name] albuterol (PROVENTIL HFA;VENTOLIN HFA) 108 (90 Base) MCG/ACT inhaler Inhale 1-2 puffs into the lungs every 6 (six) hours as needed for shortness of breath or wheezing.    . Biotin 10000 MCG TABS Take 10,000 mcg by mouth at bedtime.     . cetirizine (ZYRTEC) 10 MG chewable tablet Chew 10 mg by mouth daily.    . cholecalciferol (VITAMIN D) 1000 units tablet Take 1 tablet (1,000 Units total) by mouth 2 (two) times daily. 60 tablet 0  . clorazepate (TRANXENE) 7.5 MG tablet Take 7.5 mg by mouth 3 (three) times daily as needed for anxiety.    [name] diltiazem (CARTIA XT) 180  MG 24 hr capsule Take 1 capsule (180 mg total) by mouth daily. 30 capsule 11  . DULoxetine (CYMBALTA) 60 MG capsule Take 60 mg by mouth daily with supper.     . Methylcellulose, Laxative, 500 MG TABS Take 1 tablet by mouth daily.     . Multiple Vitamin (MULTIVITAMIN WITH MINERALS) TABS tablet Take 1 tablet by mouth 2 (two) times daily. Centrum    . pantoprazole (PROTONIX) 40 MG tablet Take 1 tablet (40 mg total) by mouth every morning. 90 tablet 3  . Probiotic Product (RESTORA) CAPS Take 1 capsule by mouth daily. (Patient taking differently: Take 1 capsule by mouth daily with supper. ) 30 capsule 2  . simvastatin (ZOCOR) 20 MG tablet Take 1 tablet (20 mg total) by mouth daily. 30 tablet 11  .  sucralfate (CARAFATE) 1 g tablet Take 1 tablet (1 g total) by mouth 4 (four) times daily -  with meals and at bedtime. May dissolve tablet in 1 tablespoon of water to take as a slurry 120 tablet 0  . traMADol (ULTRAM) 50 MG tablet Take 50 mg by mouth every 6 (six) hours as needed (for pain.).     [name] vitamin B-12 (CYANOCOBALAMIN) 1000 MCG tablet Take 1,000 mcg by mouth daily.     Current Facility-Administered Medications on File Prior to Visit  Medication Dose Route Frequency Provider Last Rate Last Dose  . 0.9 %  sodium chloride infusion  500 mL Intravenous Continuous Armbruster, Carlota Raspberry, MD      . 0.9 %  sodium chloride infusion  500 mL Intravenous Continuous Armbruster, Carlota Raspberry, MD        PAST MEDICAL HISTORY: Past Medical History:  Diagnosis Date  . Allergy   . Anemia   . Antral polyp    benign  . Anxiety   . Arthritis   . Asthma   . B12 deficiency   . Back pain   . Chest pain   . Constipation   . Depression   . Diverticulosis   . Gallbladder problem   . GERD (gastroesophageal reflux disease) 09/09/2011  . History of transfusion   . HTN (hypertension) 09/09/2011  . Hyperlipemia 09/09/2011  . Hypertension   . IBS (irritable bowel syndrome)   . Internal hemorrhoid   . Joint pain   . Obesity   . Osteoarthritis   . Paroxysmal atrial fibrillation (HCC)   . PONV (postoperative nausea and vomiting)    with big surgeries, none with endos etc, with colonoscopy- 5-6 years ago could not swallow   . Shortness of breath   . Sleep apnea    borderline per patient no cpap   . SOB (shortness of breath) on exertion   . Swallowing difficulty   . Tachycardia   . Unstable angina ([name]) 09/09/2011  . Vitamin D deficiency   . Vocal cord dysfunction 2013   swelling    PAST SURGICAL HISTORY: Past Surgical History:  Procedure Laterality Date  . ABDOMINAL HYSTERECTOMY  1988  . ANKLE FUSION Right    x 2  . BACK SURGERY  [phone]   3 ruptured discs, lower back  . BILATERAL  ANTERIOR TOTAL HIP ARTHROPLASTY Bilateral 11/12/2014   Procedure: BILATERAL ANTERIOR TOTAL HIP ARTHROPLASTY;  Surgeon: Paralee Cancel, MD;  Location: [facility];  Service: Orthopedics;  Laterality: Bilateral;  . BRAVO [place] STUDY [name] 03/06/2013   Procedure: BRAVO [place] STUDY;  Surgeon: Inda Castle, MD;  Location: [facility];  Service: Endoscopy;  Laterality: [name];  .  CARDIAC CATHETERIZATION  2013   NORMAL  . CHOLECYSTECTOMY    . COLONOSCOPY    . ELBOW SURGERY Left    removed bone chip  . ESOPHAGOGASTRODUODENOSCOPY [name] 03/06/2013   Procedure: ESOPHAGOGASTRODUODENOSCOPY (EGD);  Surgeon: Inda Castle, MD;  Location: [facility];  Service: Endoscopy;  Laterality: [name];  . FOOT OSTEOTOMY W/ PLANTAR FASCIA RELEASE Left   . ILIOTIBIAL BAND RELEASE Right 1997  . JOINT REPLACEMENT    . LAPAROSCOPIC GASTRIC SLEEVE RESECTION WITH HIATAL HERNIA REPAIR  08/20/2014   Procedure: LAPAROSCOPIC GASTRIC SLEEVE RESECTION WITH HIATAL HERNIA  REPAIR AND  UPPER ENDOSCOPY;  Surgeon: Johnathan Hausen, MD;  Location: [facility];  Service: General;;  . LEFT HEART CATHETERIZATION WITH CORONARY ANGIOGRAM Bilateral 09/10/2011   Procedure: LEFT HEART CATHETERIZATION WITH CORONARY ANGIOGRAM;  Surgeon: Lorretta Harp, MD;  Location: [facility];  Service: Cardiovascular;  Laterality: Bilateral;  . NASAL SINUS SURGERY  1997  . REVERSE SHOULDER ARTHROPLASTY Right 05/14/2016   Procedure: RIGHT REVERSE SHOULDER ARTHROPLASTY;  Surgeon: Netta Cedars, MD;  Location: [facility];  Service: Orthopedics;  Laterality: Right;  . SHOULDER SURGERY Left   . tfc wrist Left 1997  . thumb replacement Bilateral    one 2009 and one 2014  . TOTAL ANKLE REPLACEMENT Right    x 3  . TOTAL ANKLE REPLACEMENT Left   . TOTAL ANKLE REPLACEMENT    . TOTAL HIP ARTHROPLASTY Bilateral   . TOTAL KNEE ARTHROPLASTY Bilateral    left 2002, right 2012  . TOTAL SHOULDER ARTHROPLASTY Right 05/14/2016  . TRANSTHORACIC ECHOCARDIOGRAM  09/09/2011   MILD CONCENTRIC LVH. TRACE  MITRAL REGURG. TRACE TR. MILD AORTIC REGURG.  . 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 UPPER GI ENDOSCOPY  08/20/2014   Procedure: UPPER GI ENDOSCOPY;  Surgeon: Johnathan Hausen, MD;  Location: [facility];  Service: General;;  . WRIST SURGERY Left 1997, 2009     SOCIAL HISTORY: Social History   Tobacco Use  . Smoking status: Never Smoker  . Smokeless tobacco: Never Used  Substance Use Topics  . Alcohol use: No  . Drug use: No    FAMILY HISTORY: Family History  Problem Relation Age of Onset  . Hypertension Mother   . Diabetes Mother   . Arthritis Mother   . Esophageal cancer Mother   . Cancer Mother   . Esophageal cancer Father 61  . Cancer Father   . Alcoholism Father   . Other Sister        kidney mass removed  . Hypertension Sister   . Heart disease Maternal Grandmother   . Stroke Paternal Grandmother   . Coronary artery disease Neg Hx   . Colon cancer Neg Hx   . Liver disease Neg Hx   . Kidney disease Neg Hx   . Rectal cancer Neg Hx   . Stomach cancer Neg Hx     ROS: Review of Systems  Constitutional: Negative for weight loss.  Gastrointestinal: Negative for nausea.  Endo/Heme/Allergies:       Negative hypoglycemia  Psychiatric/Behavioral: Positive for depression. Negative for suicidal ideas.    PHYSICAL EXAM: Blood pressure 113/72, pulse 89, temperature 98.2 F (36.8 C), temperature source Oral, height 5\' 3"  (1.6 m), weight 200 lb (90.7 kg), SpO2 99 %. Body mass index is 35.43 kg/m. Physical Exam  Constitutional: She is oriented to person, place, and time. She appears well-developed and well-nourished.  Cardiovascular: Normal rate.  Pulmonary/Chest: Effort normal.  Musculoskeletal: Normal  range of motion.  Neurological: She is oriented to person, place, and time.  Skin: Skin is warm and dry.  Psychiatric: She has a normal mood and affect. Her behavior is normal.  Vitals reviewed.   RECENT LABS AND TESTS: BMET    Component Value Date/Time    NA 143 03/04/2017 1109   NA 146 (H) 02/15/2017 1207   K 3.8 03/04/2017 1109   CL 108 03/04/2017 1109   CO2 30 03/04/2017 1109   GLUCOSE 76 03/04/2017 1109   BUN 18 03/04/2017 1109   BUN 15 02/15/2017 1207   CREATININE 0.99 03/04/2017 1109   CALCIUM 8.8 03/04/2017 1109   GFRNONAA 77 02/15/2017 1207   GFRAA 89 02/15/2017 1207   Lab Results  Component Value Date   HGBA1C 5.4 10/08/2016   HGBA1C 5.8 (H) 10/13/2014   Lab Results  Component Value Date   INSULIN 8.2 02/15/2017   INSULIN 9.8 10/08/2016   CBC    Component Value Date/Time   WBC 4.2 03/04/2017 1109   RBC 4.09 03/04/2017 1109   HGB 12.7 03/04/2017 1109   HGB 13.0 02/15/2017 1207   HCT 39.0 03/04/2017 1109   HCT 40.0 02/15/2017 1207   PLT 183.0 03/04/2017 1109   MCV 95.3 03/04/2017 1109   MCV 94 02/15/2017 1207   MCH 30.4 02/15/2017 1207   MCH 31.7 05/10/2016 1304   MCHC 32.7 03/04/2017 1109   RDW 13.1 03/04/2017 1109   RDW 13.3 02/15/2017 1207   LYMPHSABS 1.4 03/04/2017 1109   LYMPHSABS 1.4 02/15/2017 1207   MONOABS 0.3 03/04/2017 1109   EOSABS 0.1 03/04/2017 1109   EOSABS 0.1 02/15/2017 1207   BASOSABS 0.0 03/04/2017 1109   BASOSABS 0.0 02/15/2017 1207   Iron/TIBC/Ferritin/ %Sat No results found for: IRON, TIBC, FERRITIN, IRONPCTSAT Lipid Panel     Component Value Date/Time   CHOL 204 (H) 02/15/2017 1207   TRIG 115 02/15/2017 1207   HDL 84 02/15/2017 1207   LDLCALC 97 02/15/2017 1207   Hepatic Function Panel     Component Value Date/Time   PROT 6.0 03/04/2017 1109   PROT 5.8 (L) 02/15/2017 1207   ALBUMIN 3.8 03/04/2017 1109   ALBUMIN 4.1 02/15/2017 1207   AST 22 03/04/2017 1109   ALT 14 03/04/2017 1109   ALKPHOS 78 03/04/2017 1109   BILITOT 0.5 03/04/2017 1109   BILITOT 0.3 02/15/2017 1207      Component Value Date/Time   TSH 1.60 09/30/2016   TSH 0.771 10/13/2014 1700   TSH 1.37 08/03/2013 1421    ASSESSMENT AND PLAN: Prediabetes  Other depression - with emotional eating - Plan:  buPROPion (WELLBUTRIN SR) 200 MG 12 hr tablet  Class 2 severe obesity with serious comorbidity and body mass index (BMI) of 35.0 to 35.9 in adult, unspecified obesity type ([name])  PLAN:  Depression with Emotional Eating Behaviors We discussed behavior modification techniques today to help [name] deal with her emotional eating and depression. [name] agrees to continue taking Wellbutrin SR 200 mg q AM #30 with no refills. [name] agrees to follow up with our clinic in 3 weeks.  Pre-Diabetes [name] will continue to work on weight loss, exercise, and decreasing simple carbohydrates in her diet to help decrease the risk of diabetes. We dicussed metformin including benefits and risks. She was informed that eating too many simple carbohydrates or too many calories at one sitting increases the likelihood of GI side effects. [name] agrees to restart metformin 500 mg at 1/2 PO q AM, no refill  needed. [name] agrees to follow up with our clinic in 3 weeks as directed to monitor her progress.  Obesity [name] is currently in the action stage of change. As such, her goal is to continue with weight loss efforts She has agreed to keep a food journal with 1300 calories and 75+ grams of protein daily [name] has been instructed to work up to a goal of 150 minutes of combined cardio and strengthening exercise per week for weight loss and overall health benefits. We discussed the following Behavioral Modification Strategies today: work on meal planning and easy cooking plans, emotional eating strategies and ways to avoid night time snacking   [name] has agreed to follow up with our clinic in 3 weeks. She was informed of the importance of frequent follow up visits to maximize her success with intensive lifestyle modifications for her multiple health conditions.   OBESITY BEHAVIORAL INTERVENTION VISIT  Today's visit was # 10 out of 22.  Starting weight: 193 lbs Starting date: 10/08/16 Today's weight : 200  lbs  Today's date: 06/07/2017 Total lbs lost to date: 0 (Patients must lose 7 lbs in the first 6 months to continue with counseling)   ASK: We discussed the diagnosis of obesity with [name] today and [name] agreed to give [place] permission to discuss obesity behavioral modification therapy today.  ASSESS: [name] has the diagnosis of obesity and her BMI today is 35.44 [name] is in the action stage of change   ADVISE: [name] was educated on the multiple health risks of obesity as well as the benefit of weight loss to improve her health. She was advised of the need for long term treatment and the importance of lifestyle modifications.  AGREE: Multiple dietary modification options and treatment options were discussed and  [place] agreed to the above obesity treatment plan.  I, [name], am acting as transcriptionist for Dennard Nip, MD  I have reviewed the above documentation for accuracy and completeness, and I agree with the above. -Dennard Nip, MD

## 2017-06-09 ENCOUNTER — Other Ambulatory Visit: Payer: Self-pay

## 2017-06-09 ENCOUNTER — Encounter: Payer: Self-pay | Admitting: Physical Therapy

## 2017-06-09 ENCOUNTER — Ambulatory Visit: Payer: Medicare Other | Attending: Otolaryngology | Admitting: Physical Therapy

## 2017-06-09 DIAGNOSIS — M6281 Muscle weakness (generalized): Secondary | ICD-10-CM | POA: Diagnosis not present

## 2017-06-09 DIAGNOSIS — R42 Dizziness and giddiness: Secondary | ICD-10-CM | POA: Diagnosis not present

## 2017-06-09 DIAGNOSIS — R296 Repeated falls: Secondary | ICD-10-CM | POA: Diagnosis not present

## 2017-06-09 DIAGNOSIS — R2681 Unsteadiness on feet: Secondary | ICD-10-CM | POA: Insufficient documentation

## 2017-06-09 NOTE — Therapy (Signed)
[facility] [address] [place], [place], [address] Phone: [phone]   Fax:  [phone]  Physical Therapy Evaluation  Patient Details  Name: [name] MRN: [id number] Date of Birth: Sep 01, 1945 Referring Provider: Vicie Mutters, MD   Encounter Date: 06/09/2017  PT End of Session - 06/09/17 1218    Visit Number  1    Number of Visits  17    Date for PT Re-Evaluation  09/07/17 re-assess at 60 days - need final visits?    Authorization Type  Medicare and [employer] - Covered 100%    PT Start Time  1016    PT Stop Time  1107    PT Time Calculation (min)  51 min    Activity Tolerance  Patient tolerated treatment well    Behavior During Therapy  WFL for tasks assessed/performed       Past Medical History:  Diagnosis Date  . Allergy   . Anemia   . Antral polyp    benign  . Anxiety   . Arthritis   . Asthma   . B12 deficiency   . Back pain   . Chest pain   . Constipation   . Depression   . Diverticulosis   . Gallbladder problem   . GERD (gastroesophageal reflux disease) 09/09/2011  . History of transfusion   . HTN (hypertension) 09/09/2011  . Hyperlipemia 09/09/2011  . Hypertension   . IBS (irritable bowel syndrome)   . Internal hemorrhoid   . Joint pain   . Obesity   . Osteoarthritis   . Paroxysmal atrial fibrillation (HCC)   . PONV (postoperative nausea and vomiting)    with big surgeries, none with endos etc, with colonoscopy- 5-6 years ago could not swallow   . Shortness of breath   . Sleep apnea    borderline per patient no cpap   . SOB (shortness of breath) on exertion   . Swallowing difficulty   . Tachycardia   . Unstable angina ([place]) 09/09/2011  . Vitamin D deficiency   . Vocal cord dysfunction 2013   swelling    Past Surgical History:  Procedure Laterality Date  . ABDOMINAL HYSTERECTOMY  1988  . ANKLE FUSION Right    x 2  . BACK SURGERY  [phone]   3 ruptured discs, lower back   . BILATERAL ANTERIOR TOTAL HIP ARTHROPLASTY Bilateral 11/12/2014   Procedure: BILATERAL ANTERIOR TOTAL HIP ARTHROPLASTY;  Surgeon: Paralee Cancel, MD;  Location: [facility];  Service: Orthopedics;  Laterality: Bilateral;  . BRAVO [place] STUDY [name] 03/06/2013   Procedure: BRAVO [place] STUDY;  Surgeon: Inda Castle, MD;  Location: [facility];  Service: Endoscopy;  Laterality: [name];  . CARDIAC CATHETERIZATION  2013   NORMAL  . CHOLECYSTECTOMY    . COLONOSCOPY    . ELBOW SURGERY Left    removed bone chip  . ESOPHAGOGASTRODUODENOSCOPY [name] 03/06/2013   Procedure: ESOPHAGOGASTRODUODENOSCOPY (EGD);  Surgeon: Inda Castle, MD;  Location: [facility];  Service: Endoscopy;  Laterality: [name];  . FOOT OSTEOTOMY W/ PLANTAR FASCIA RELEASE Left   . ILIOTIBIAL BAND RELEASE Right 1997  . JOINT REPLACEMENT    . LAPAROSCOPIC GASTRIC SLEEVE RESECTION WITH HIATAL HERNIA REPAIR  08/20/2014   Procedure: LAPAROSCOPIC GASTRIC SLEEVE RESECTION WITH HIATAL HERNIA  REPAIR AND  UPPER ENDOSCOPY;  Surgeon: Johnathan Hausen, MD;  Location: [facility];  Service: General;;  . LEFT HEART CATHETERIZATION WITH CORONARY ANGIOGRAM Bilateral 09/10/2011   Procedure: LEFT HEART CATHETERIZATION WITH CORONARY  ANGIOGRAM;  Surgeon: Lorretta Harp, MD;  Location: [facility] CATH LAB;  Service: Cardiovascular;  Laterality: Bilateral;  . NASAL SINUS SURGERY  1997  . REVERSE SHOULDER ARTHROPLASTY Right 05/14/2016   Procedure: RIGHT REVERSE SHOULDER ARTHROPLASTY;  Surgeon: Netta Cedars, MD;  Location: [facility];  Service: Orthopedics;  Laterality: Right;  . SHOULDER SURGERY Left   . tfc wrist Left 1997  . thumb replacement Bilateral    one 2009 and one 2014  . TOTAL ANKLE REPLACEMENT Right    x 3  . TOTAL ANKLE REPLACEMENT Left   . TOTAL ANKLE REPLACEMENT    . TOTAL HIP ARTHROPLASTY Bilateral   . TOTAL KNEE ARTHROPLASTY Bilateral    left 2002, right 2012  . TOTAL SHOULDER ARTHROPLASTY Right 05/14/2016  . TRANSTHORACIC ECHOCARDIOGRAM  09/09/2011   MILD  CONCENTRIC LVH. TRACE MITRAL REGURG. TRACE TR. MILD AORTIC REGURG.  . 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 UPPER GI ENDOSCOPY  08/20/2014   Procedure: UPPER GI ENDOSCOPY;  Surgeon: Johnathan Hausen, MD;  Location: [facility];  Service: General;;  . WRIST SURGERY Left 1997, 2009     There were no vitals filed for this visit.   Subjective Assessment - 06/09/17 1023    Subjective  Pt seen by Dr. Thornell Mule for dizziness in 2018; diagnosed with L BPPV and treated by MD x 3 with resolution of dizziness.  Pt has also been experiencing LOB and falls since December.  Pt has a history of multiple ear infections and respiratory infections taking multiple courses of antibiotics and steroids.  Pt also experienced diplopia; diagnosed with ocular aneurysm - diplopia has partially resolved but still has visual impairments.  Vertigo is resolved.  Both falls have happened when changing direction suddenly.      Patient is accompained by:  Family member    Pertinent History  anxiety, OA, back pain, chest pain, depression, HTN, hyperlipidemia, IBS, Afib, sleep apnea, multiple surgeries including R ankle fusion, shoulder replacement, bilat hip replacements, back surgery, bilat knee replacements, and wrist surgery, obesity, bilat sensorineural hearing loss, tinnitus L ear, L ocular aneurysm with diplopia and multiple childhood ear infections    Limitations  Walking    Diagnostic tests  --    Patient Stated Goals  To prevent future falls, improved balance    Currently in Pain?  No/denies         [facility] PT Assessment - 06/09/17 1034      Assessment   Medical Diagnosis  Dizziness and Imbalance    Referring Provider  Vicie Mutters, MD    Onset Date/Surgical Date  02/16/17    Hand Dominance  Left    Prior Therapy  yes      Precautions   Precautions  Fall;Other (comment)    Precaution Comments  anxiety, OA, back pain, chest pain, depression, HTN, hyperlipidemia, IBS, Afib, sleep apnea, multiple surgeries including R  ankle fusion, shoulder replacement, bilat hip replacements, back surgery, bilat knee replacements, and wrist surgery, obesity, bilat sensorineural hearing loss, tinnitus L ear, L ocular aneurysm with diplopia and multiple childhood ear infections      Balance Screen   Has the patient fallen in the past 6 months  Yes    How many times?  2    Has the patient had a decrease in activity level because of a fear of falling?   No    Is the patient reluctant to leave their home because of a fear of falling?  No      Home Environment   Living Environment  Private residence    Living Arrangements  Spouse/significant other    Type of [place] to enter    Entrance Stairs-Number of Steps  3    [place]  One level    [place]  None      Prior Function   Level of [place]  Retired    Leisure  no exercise program currently      Observation/Other Assessments   Focus on Therapeutic Outcomes (FOTO)   Not indicated      Sensation   Light Touch  Appears Intact    Proprioception  Impaired by gross assessment    Additional Comments  multiple joint replacements      ROM / Strength   AROM / PROM / Strength  Strength      Strength   Overall Strength  Deficits    Overall Strength Comments  LLE: 5/5; RLE: hip flexion 4-/5, knee extension 5/5, knee flexion 4-/5, ankle DF 4-/5      Ambulation/Gait   Ambulation/Gait  Yes    Ambulation/Gait Assistance  6: Modified independent (Device/Increase time)    Ambulation Distance (Feet)  300 Feet    Assistive device  None    Gait Pattern  Narrow base of support;Step-through pattern;Decreased step length - right;Decreased step length - left;Decreased stride length    Ambulation Surface  Level;Indoor    Gait velocity  slow: 1.8 ft/sec    Gait Comments  pt reports having to descend stairs step to due to ankle fusion      Standardized Balance Assessment   Standardized  Balance Assessment  10 meter walk test;Berg Balance Test    10 Meter Walk  18.03 seconds: 1.39ft/sec      Berg Balance Test   Berg comment:  TBD at next visit      Functional Gait  Assessment   Gait assessed   Yes    FGA comment:  TBD at next visit         Vestibular Assessment - 06/09/17 1044      Vestibular Assessment   General Observation  ambulates slowly      Symptom Behavior   Type of Dizziness  Imbalance    Frequency of Dizziness  with quick turns; veering to R    Duration of Dizziness  n/a    Aggravating Factors  Comment uneven surfaces    Relieving Factors  Not applicable      Occulomotor Exam   Occulomotor Alignment  Normal    Spontaneous  Absent    Gaze-induced  Absent    Smooth Pursuits  Intact difficulty with diagonals    Saccades  Slow    Comment  Convergence intact but reports intermittent diplopia       Vestibulo-Occular Reflex   VOR to Slow Head Movement  Normal anticipation of dizziness    VOR Cancellation  Normal    Comment  HIT: - to R, + to L      Visual Acuity   Static  6    Dynamic  4         Objective measurements completed on examination: See above findings.              PT Education - 06/09/17 1217    Education provided  Yes    Education  Details  Clinical findings, PT POC and goals, vestibular hypofunction    Person(s) Educated  Patient;Spouse    Methods  Explanation    Comprehension  Verbalized understanding       PT Short Term Goals - 06/09/17 1224      PT SHORT TERM GOAL #1   Title  Pt will participate in further balance assessments: FGA and BERG    Time  4    Period  Weeks    Status  New    Target Date  07/09/17      PT SHORT TERM GOAL #2   Title  Pt will demonstrate increase in gait velocity to >/= 2.0 ft/sec    Baseline  1.8 ft/sec    Time  4    Period  Weeks    Status  New    Target Date  07/09/17      PT SHORT TERM GOAL #3   Title  Pt will demonstrate independence with initial vestibular/balance  HEP    Time  4    Period  Weeks    Status  New    Target Date  07/09/17      PT SHORT TERM GOAL #4   Title  Pt will report 50% reduction in sensation of disequilibrium during gait (veering to R) and with quick turns    Baseline  veering occurs every day; 2 falls during quick turns    Time  4    Period  Weeks    Status  New    Target Date  07/09/17        PT Long Term Goals - 06/09/17 1227      PT LONG TERM GOAL #1   Title  Pt will demonstrate independence with walking program and final vestibular/balance HEP    Time  8    Period  Weeks    Status  New    Target Date  08/08/17      PT LONG TERM GOAL #2   Title  Pt will improve gait velocity to >/= 2.4 ft/sec to decrease falls risk    Time  8    Period  Weeks    Status  New    Target Date  08/08/17      PT LONG TERM GOAL #3   Title  Pt will improve FGA by 4 points    Baseline  TBD    Time  8    Period  Weeks    Status  New    Target Date  08/08/17      PT LONG TERM GOAL #4   Title  Pt will improve BERG by 8 points    Baseline  TBD    Time  8    Period  Weeks    Status  New    Target Date  08/08/17      PT LONG TERM GOAL #5   Title  Pt will ambulate x 300' over indoor surfaces with quick turns to L and R with 0/10 disequilibrium    Time  8    Period  Weeks    Status  New    Target Date  08/08/17             Plan - 06/09/17 1218    Clinical Impression Statement  Pt is a 72 year old female referred to Neuro OPPT for evaluation of dizziness and balance issues; pt has been evaluated by physician at the [facility] who diagnosed her with  L BPPV and performed L Epley maneuver to treat with resolution of dizziness.  Pt's PMH is significant for the following: anxiety, OA, back pain, chest pain, depression, HTN, hyperlipidemia, IBS, Afib, sleep apnea, multiple surgeries including R ankle fusion, shoulder replacement, bilat hip replacements, back surgery, bilat knee replacements, and wrist surgery, obesity, bilat  sensorineural hearing loss, tinnitus L ear, L ocular aneurysm with diplopia and multiple childhood ear infections. The following deficits were noted during pt's exam: impaired LE strength, impaired use of VOR with L vestibular hypofunction, impaired balance and impaired gait.  Pt's gait speed indicates pt is at risk for recurrent falls.  Pt would benefit from skilled PT to address these impairments and functional limitations to maximize functional mobility independence and reduce falls risk.    History and Personal Factors relevant to plan of care:  independent, multiple falls, anxiety, OA, back pain, chest pain, depression, HTN, hyperlipidemia, IBS, Afib, sleep apnea, multiple surgeries including R ankle fusion, shoulder replacement, bilat hip replacements, back surgery, bilat knee replacements, and wrist surgery, obesity, bilat sensorineural hearing loss, tinnitus L ear, L ocular aneurysm with diplopia and multiple childhood ear infections    Clinical Presentation  Stable    Clinical Presentation due to:  independent, multiple falls, anxiety, OA, back pain, chest pain, depression, HTN, hyperlipidemia, IBS, Afib, sleep apnea, multiple surgeries including R ankle fusion, shoulder replacement, bilat hip replacements, back surgery, bilat knee replacements, and wrist surgery, obesity, bilat sensorineural hearing loss, tinnitus L ear, L ocular aneurysm with diplopia and multiple childhood ear infections    Clinical Decision Making  Low    Rehab Potential  Good    PT Frequency  2x / week    PT Duration  12 weeks    PT Treatment/Interventions  [place] Repostioning;ADLs/Self Care Home Management;Gait training;Functional mobility training;Therapeutic activities;Therapeutic exercise;Balance training;Neuromuscular re-education;Patient/family education;Vestibular    PT Next Visit Plan  assess FGA and BERG; reset LTG.  initiate x 1 viewing training for L hypofunction    Consulted and Agree with Plan of Care  Patient        Patient will benefit from skilled therapeutic intervention in order to improve the following deficits and impairments:  Decreased balance, Decreased strength, Difficulty walking, Dizziness  Visit Diagnosis: Dizziness and giddiness  Unsteadiness on feet  Repeated falls  Muscle weakness (generalized)     Problem List Patient Active Problem List   Diagnosis Date Noted  . Colon cancer screening 03/24/2017  . Insulin resistance 02/15/2017  . Vitamin D deficiency 02/15/2017  . High serum vitamin B12 02/15/2017  . S/P shoulder replacement, right 05/14/2016  . Acute blood loss anemia 12/04/2014  . Obese 11/13/2014  . S/P bilateral THA, AA 11/12/2014  . Confusion 10/13/2014  . Acute confusional state 10/13/2014  . 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 Aftercare following joint replacement 03/28/2014  . History of artificial joint 03/28/2014  . Degenerative arthritis of hip 09/27/2013  . Paroxysmal atrial fibrillation ([place]) 09/07/2013  . Tachycardia 07/03/2013  . Snoring 07/03/2013  . Diverticulosis of colon without hemorrhage 06/26/2013  . RUQ abdominal pain 01/04/2013  . Nausea without vomiting 01/04/2013  . Hoarseness of voice 01/04/2013  . Diarrhea 01/04/2013  . Broken toe 03/09/2012  . Dyspnea 11/05/2011  . Upper airway cough syndrome 11/05/2011  . Asthma, persistent controlled 11/05/2011  . Morbid obesity with BMI of 45.0-49.9, adult ([place]) 11/05/2011  . HTN (hypertension) 09/09/2011  . Hyperlipemia 09/09/2011  . Bursitis, trochanteric 08/09/2011  . Ankle arthropathy 05/31/2011  Rico Junker, PT, DPT 06/09/17    12:32 PM    [facility] [address] [place], [place], [address] Phone: [phone]   Fax:  [phone]  Name: [name] MRN: [id number] Date of Birth: [birth year]

## 2017-06-21 ENCOUNTER — Ambulatory Visit: Payer: Medicare Other | Attending: Otolaryngology

## 2017-06-21 DIAGNOSIS — R42 Dizziness and giddiness: Secondary | ICD-10-CM | POA: Insufficient documentation

## 2017-06-21 DIAGNOSIS — R2681 Unsteadiness on feet: Secondary | ICD-10-CM | POA: Diagnosis not present

## 2017-06-21 DIAGNOSIS — R296 Repeated falls: Secondary | ICD-10-CM | POA: Diagnosis not present

## 2017-06-21 DIAGNOSIS — M6281 Muscle weakness (generalized): Secondary | ICD-10-CM | POA: Diagnosis not present

## 2017-06-21 NOTE — Therapy (Signed)
[facility] [address] [place], [place], [address] Phone: [phone]   Fax:  [phone]  Physical Therapy Treatment  Patient Details  Name: [name] MRN: [id number] Date of Birth: [birth year] Referring Provider: Vicie Mutters, MD   Encounter Date: 06/21/2017  PT End of Session - 06/21/17 1325    Visit Number  2    Number of Visits  17    Date for PT Re-Evaluation  09/07/17    Authorization Type  Medicare and [employer] - Covered 100%    PT Start Time  1230    PT Stop Time  1319    PT Time Calculation (min)  49 min    Equipment Utilized During Treatment  Gait belt    Activity Tolerance  Patient tolerated treatment well    Behavior During Therapy  WFL for tasks assessed/performed       Past Medical History:  Diagnosis Date  . Allergy   . Anemia   . Antral polyp    benign  . Anxiety   . Arthritis   . Asthma   . B12 deficiency   . Back pain   . Chest pain   . Constipation   . Depression   . Diverticulosis   . Gallbladder problem   . GERD (gastroesophageal reflux disease) 09/09/2011  . History of transfusion   . HTN (hypertension) 09/09/2011  . Hyperlipemia 09/09/2011  . Hypertension   . IBS (irritable bowel syndrome)   . Internal hemorrhoid   . Joint pain   . Obesity   . Osteoarthritis   . Paroxysmal atrial fibrillation (HCC)   . PONV (postoperative nausea and vomiting)    with big surgeries, none with endos etc, with colonoscopy- 5-6 years ago could not swallow   . Shortness of breath   . Sleep apnea    borderline per patient no cpap   . SOB (shortness of breath) on exertion   . Swallowing difficulty   . Tachycardia   . Unstable angina ([place]) 09/09/2011  . Vitamin D deficiency   . Vocal cord dysfunction 2013   swelling    Past Surgical History:  Procedure Laterality Date  . ABDOMINAL HYSTERECTOMY  1988  . ANKLE FUSION Right    x 2  . BACK SURGERY  [phone]   3 ruptured discs, lower  back  . BILATERAL ANTERIOR TOTAL HIP ARTHROPLASTY Bilateral 11/12/2014   Procedure: BILATERAL ANTERIOR TOTAL HIP ARTHROPLASTY;  Surgeon: Paralee Cancel, MD;  Location: [facility];  Service: Orthopedics;  Laterality: Bilateral;  . BRAVO [place] STUDY [name] 03/06/2013   Procedure: BRAVO [place] STUDY;  Surgeon: Inda Castle, MD;  Location: [facility];  Service: Endoscopy;  Laterality: [name];  . CARDIAC CATHETERIZATION  2013   NORMAL  . CHOLECYSTECTOMY    . COLONOSCOPY    . ELBOW SURGERY Left    removed bone chip  . ESOPHAGOGASTRODUODENOSCOPY [name] 03/06/2013   Procedure: ESOPHAGOGASTRODUODENOSCOPY (EGD);  Surgeon: Inda Castle, MD;  Location: [facility];  Service: Endoscopy;  Laterality: [name];  . FOOT OSTEOTOMY W/ PLANTAR FASCIA RELEASE Left   . ILIOTIBIAL BAND RELEASE Right 1997  . JOINT REPLACEMENT    . LAPAROSCOPIC GASTRIC SLEEVE RESECTION WITH HIATAL HERNIA REPAIR  08/20/2014   Procedure: LAPAROSCOPIC GASTRIC SLEEVE RESECTION WITH HIATAL HERNIA  REPAIR AND  UPPER ENDOSCOPY;  Surgeon: Johnathan Hausen, MD;  Location: [facility];  Service: General;;  . LEFT HEART CATHETERIZATION WITH CORONARY ANGIOGRAM Bilateral 09/10/2011   Procedure: LEFT HEART CATHETERIZATION  WITH CORONARY ANGIOGRAM;  Surgeon: Lorretta Harp, MD;  Location: [facility];  Service: Cardiovascular;  Laterality: Bilateral;  . NASAL SINUS SURGERY  1997  . REVERSE SHOULDER ARTHROPLASTY Right 05/14/2016   Procedure: RIGHT REVERSE SHOULDER ARTHROPLASTY;  Surgeon: Netta Cedars, MD;  Location: [facility];  Service: Orthopedics;  Laterality: Right;  . SHOULDER SURGERY Left   . tfc wrist Left 1997  . thumb replacement Bilateral    one 2009 and one 2014  . TOTAL ANKLE REPLACEMENT Right    x 3  . TOTAL ANKLE REPLACEMENT Left   . TOTAL ANKLE REPLACEMENT    . TOTAL HIP ARTHROPLASTY Bilateral   . TOTAL KNEE ARTHROPLASTY Bilateral    left 2002, right 2012  . TOTAL SHOULDER ARTHROPLASTY Right 05/14/2016  . TRANSTHORACIC ECHOCARDIOGRAM  09/09/2011   MILD  CONCENTRIC LVH. TRACE MITRAL REGURG. TRACE TR. MILD AORTIC REGURG.  . 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 UPPER GI ENDOSCOPY  08/20/2014   Procedure: UPPER GI ENDOSCOPY;  Surgeon: Johnathan Hausen, MD;  Location: [facility];  Service: General;;  . WRIST SURGERY Left 1997, 2009     There were no vitals filed for this visit.  Subjective Assessment - 06/21/17 1234    Subjective  Pt denied falls since last visit. Pt has been paying more attention to balance/unsteadiness and unsteadiness occurs mainly when turning while ambulating.     Pertinent History  anxiety, OA, back pain, chest pain, depression, HTN, hyperlipidemia, IBS, Afib, sleep apnea, multiple surgeries including R ankle fusion, shoulder replacement, bilat hip replacements, back surgery, bilat knee replacements, and wrist surgery, obesity, bilat sensorineural hearing loss, tinnitus L ear, L ocular aneurysm with diplopia and multiple childhood ear infections    Currently in Pain?  No/denies         [facility] PT Assessment - 06/21/17 1255      Functional Gait  Assessment   Gait assessed   Yes    Gait Level Surface  Walks 20 ft, slow speed, abnormal gait pattern, evidence for imbalance or deviates 10-15 in outside of the 12 in walkway width. Requires more than 7 sec to ambulate 20 ft.    Change in Gait Speed  Makes only minor adjustments to walking speed, or accomplishes a change in speed with significant gait deviations, deviates 10-15 in outside the 12 in walkway width, or changes speed but loses balance but is able to recover and continue walking.    Gait with Horizontal Head Turns  Performs head turns with moderate changes in gait velocity, slows down, deviates 10-15 in outside 12 in walkway width but recovers, can continue to walk.    Gait with Vertical Head Turns  Performs task with severe disruption of gait (eg, staggers 15 in outside 12 in walkway width, loses balance, stops, reaches for wall).    Gait and Pivot Turn  Pivot turns  safely in greater than 3 sec and stops with no loss of balance, or pivot turns safely within 3 sec and stops with mild imbalance, requires small steps to catch balance.    Step Over Obstacle  Cannot perform without assistance.    Gait with Narrow Base of Support  Ambulates less than 4 steps heel to toe or cannot perform without assistance.    Gait with Eyes Closed  Walks 20 ft, slow speed, abnormal gait pattern, evidence for imbalance, deviates 10-15 in outside 12 in walkway width. Requires more than 9 sec to ambulate 20 ft.  Ambulating Backwards  Walks 20 ft, slow speed, abnormal gait pattern, evidence for imbalance, deviates 10-15 in outside 12 in walkway width.    Steps  Two feet to a stair, must use rail.    Total Score  8    FGA comment:  8/30: indicates pt is at high risk for falls.                    [facility] Adult PT Treatment/Exercise - 06/21/17 1245      Standardized Balance Assessment   Standardized Balance Assessment  Berg Balance Test      Berg Balance Test   Sit to Stand  Able to stand without using hands and stabilize independently    Standing Unsupported  Able to stand safely 2 minutes    Sitting with Back Unsupported but Feet Supported on Floor or Stool  Able to sit safely and securely 2 minutes    Stand to Sit  Sits safely with minimal use of hands    Transfers  Able to transfer safely, minor use of hands    Standing Unsupported with Eyes Closed  Able to stand 10 seconds with supervision    Standing Ubsupported with Feet Together  Able to place feet together independently and stand for 1 minute with supervision    From Standing, Reach Forward with Outstretched Arm  Can reach forward >12 cm safely (5") LUE    From Standing Position, Pick up Object from Floor  Able to pick up shoe, needs supervision    From Standing Position, Turn to Look Behind Over each Shoulder  Looks behind one side only/other side shows less weight shift    Turn 360 Degrees  Able to turn 360  degrees safely but slowly    Standing Unsupported, Alternately Place Feet on Step/Stool  Able to complete 4 steps without aid or supervision    Standing Unsupported, One Foot in Front  Able to plae foot ahead of the other independently and hold 30 seconds    Standing on One Leg  Able to lift leg independently and hold 5-10 seconds    Total Score  45          Balance Exercises - 06/21/17 1324      Balance Exercises: Standing   Standing Eyes Opened  Wide (BOA);Head turns;Solid surface;3 reps;10 secs;30 secs    Standing Eyes Closed  Wide (BOA);Solid surface;2 reps;10 secs    Other Standing Exercises  Performed with mat table behind pt and chair in front of pt and S for safety. Cues and demo for technique. Please see pt instructions for HEP details.         PT Education - 06/21/17 1325    Education provided  Yes    Education Details  PT educated pt on outcome measures and balance HEP.     Person(s) Educated  Patient;Spouse    Methods  Demonstration;Explanation;Verbal cues;Handout    Comprehension  Returned demonstration;Verbalized understanding;Need further instruction       PT Short Term Goals - 06/09/17 1224      PT SHORT TERM GOAL #1   Title  Pt will participate in further balance assessments: FGA and BERG    Time  4    Period  Weeks    Status  New    Target Date  07/09/17      PT SHORT TERM GOAL #2   Title  Pt will demonstrate increase in gait velocity to >/= 2.0 ft/sec  Baseline  1.8 ft/sec    Time  4    Period  Weeks    Status  New    Target Date  07/09/17      PT SHORT TERM GOAL #3   Title  Pt will demonstrate independence with initial vestibular/balance HEP    Time  4    Period  Weeks    Status  New    Target Date  07/09/17      PT SHORT TERM GOAL #4   Title  Pt will report 50% reduction in sensation of disequilibrium during gait (veering to R) and with quick turns    Baseline  veering occurs every day; 2 falls during quick turns    Time  4    Period   Weeks    Status  New    Target Date  07/09/17        PT Long Term Goals - 06/21/17 1327      PT LONG TERM GOAL #1   Title  Pt will demonstrate independence with walking program and final vestibular/balance HEP    Time  8    Period  Weeks    Status  New      PT LONG TERM GOAL #2   Title  Pt will improve gait velocity to >/= 2.4 ft/sec to decrease falls risk    Time  8    Period  Weeks    Status  New      PT LONG TERM GOAL #3   Title  Pt will improve FGA to >/=19/24 to decr. falls risk.     Baseline  8/30    Time  8    Period  Weeks    Status  Revised      PT LONG TERM GOAL #4   Title  Pt will improve BERG by 8 points (to 53/56) to decr. falls risk.    Baseline  45/56    Time  8    Period  Weeks    Status  Revised      PT LONG TERM GOAL #5   Title  Pt will ambulate x 300' over indoor surfaces with quick turns to L and R with 0/10 disequilibrium    Time  8    Period  Weeks    Status  New            Plan - 06/21/17 1325    Clinical Impression Statement  Pt's BERG and FGA score indicate pt is at a significant risk for falls. Pt reported incr. unsteadiness and slight dizziness during activities which require incr. vestibular input. PT provided pt with balance HEP to improve vestibular input and will add x1 viewing to HEP next session. Pt would contiue to benefit from skilled PT to improve safety during functional mobility.     Rehab Potential  Good    PT Frequency  2x / week    PT Duration  12 weeks    PT Treatment/Interventions  Canalith Repostioning;ADLs/Self Care Home Management;Gait training;Functional mobility training;Therapeutic activities;Therapeutic exercise;Balance training;Neuromuscular re-education;Patient/family education;Vestibular    PT Next Visit Plan  initiate x 1 viewing training for L hypofunction, review balance HEP prn.     Consulted and Agree with Plan of Care  Patient       Patient will benefit from skilled therapeutic intervention in order  to improve the following deficits and impairments:  Decreased balance, Decreased strength, Difficulty walking, Dizziness  Visit Diagnosis: Dizziness and giddiness  Unsteadiness on  feet  Repeated falls     Problem List Patient Active Problem List   Diagnosis Date Noted  . Colon cancer screening 03/24/2017  . Insulin resistance 02/15/2017  . Vitamin D deficiency 02/15/2017  . High serum vitamin B12 02/15/2017  . S/P shoulder replacement, right 05/14/2016  . Acute blood loss anemia 12/04/2014  . Obese 11/13/2014  . S/P bilateral THA, AA 11/12/2014  . Confusion 10/13/2014  . Acute confusional state 10/13/2014  . 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 Aftercare following joint replacement 03/28/2014  . History of artificial joint 03/28/2014  . Degenerative arthritis of hip 09/27/2013  . Paroxysmal atrial fibrillation ([place]) 09/07/2013  . Tachycardia 07/03/2013  . Snoring 07/03/2013  . Diverticulosis of colon without hemorrhage 06/26/2013  . RUQ abdominal pain 01/04/2013  . Nausea without vomiting 01/04/2013  . Hoarseness of voice 01/04/2013  . Diarrhea 01/04/2013  . Broken toe 03/09/2012  . Dyspnea 11/05/2011  . Upper airway cough syndrome 11/05/2011  . Asthma, persistent controlled 11/05/2011  . Morbid obesity with BMI of 45.0-49.9, adult ([place]) 11/05/2011  . HTN (hypertension) 09/09/2011  . Hyperlipemia 09/09/2011  . Bursitis, trochanteric 08/09/2011  . Ankle arthropathy 05/31/2011    Merton Wadlow L 06/21/2017, 1:28 PM  [facility] [address] [place], [place], [address] Phone: [phone]   Fax:  [phone]  Name: MARISSIA BLACKHAM MRN: [id number] Date of Birth: 1945/08/27  Geoffry Paradise, PT,DPT 06/21/17 1:29 PM Phone: [phone] Fax: [phone]

## 2017-06-21 NOTE — Patient Instructions (Addendum)
Perform in corner with chair in front of for safety OR at kitchen sink with chair behind you:  Single Leg - Eyes Open    Holding support, lift right leg while maintaining balance over other leg. Progress to removing hands from support surface for longer periods of time. Hold__10__ seconds. Repeat with other leg.  Repeat __3__ times per session. Do __1__ sessions per day.  Copyright  VHI. All rights reserved.  Feet Apart, Varied Arm Positions - Eyes Closed    Stand with feet shoulder width apart and arms at your side. Close eyes and visualize upright position. Hold __10__ seconds. Repeat __3__ times per session. Do __1__ sessions per day.  Copyright  VHI. All rights reserved.  Feet Apart, Head Motion - Eyes Open    With eyes open, feet apart, move head slowly: up and down 5 times and side to side 5 times. Repeat __3__ times per session. Do __1__ sessions per day.  Copyright  VHI. All rights reserved.

## 2017-06-28 ENCOUNTER — Ambulatory Visit: Payer: Medicare Other

## 2017-06-28 ENCOUNTER — Ambulatory Visit (INDEPENDENT_AMBULATORY_CARE_PROVIDER_SITE_OTHER): Payer: Medicare Other | Admitting: Family Medicine

## 2017-06-28 ENCOUNTER — Encounter (INDEPENDENT_AMBULATORY_CARE_PROVIDER_SITE_OTHER): Payer: Self-pay

## 2017-06-28 DIAGNOSIS — R2681 Unsteadiness on feet: Secondary | ICD-10-CM

## 2017-06-28 DIAGNOSIS — R296 Repeated falls: Secondary | ICD-10-CM | POA: Diagnosis not present

## 2017-06-28 DIAGNOSIS — R42 Dizziness and giddiness: Secondary | ICD-10-CM | POA: Diagnosis not present

## 2017-06-28 DIAGNOSIS — M6281 Muscle weakness (generalized): Secondary | ICD-10-CM | POA: Diagnosis not present

## 2017-06-28 NOTE — Therapy (Signed)
[facility] [address] [place], [place], [address] Phone: [phone]   Fax:  [phone]  Physical Therapy Treatment  Patient Details  Name: [name] MRN: [id number] Date of Birth: July 03, 1945 Referring Provider: Vicie Mutters, MD   Encounter Date: 06/28/2017  PT End of Session - 06/28/17 1313    Visit Number  3    Number of Visits  17    Date for PT Re-Evaluation  09/07/17    Authorization Type  Medicare and AARP - Covered 100%    PT Start Time  4008    PT Stop Time  1312    PT Time Calculation (min)  41 min    Activity Tolerance  Patient tolerated treatment well min guard to S prn    Behavior During Therapy  [facility] for tasks assessed/performed       Past Medical History:  Diagnosis Date  . Allergy   . Anemia   . Antral polyp    benign  . Anxiety   . Arthritis   . Asthma   . B12 deficiency   . Back pain   . Chest pain   . Constipation   . Depression   . Diverticulosis   . Gallbladder problem   . GERD (gastroesophageal reflux disease) 09/09/2011  . History of transfusion   . HTN (hypertension) 09/09/2011  . Hyperlipemia 09/09/2011  . Hypertension   . IBS (irritable bowel syndrome)   . Internal hemorrhoid   . Joint pain   . Obesity   . Osteoarthritis   . Paroxysmal atrial fibrillation (HCC)   . PONV (postoperative nausea and vomiting)    with big surgeries, none with endos etc, with colonoscopy- 5-6 years ago could not swallow   . Shortness of breath   . Sleep apnea    borderline per patient no cpap   . SOB (shortness of breath) on exertion   . Swallowing difficulty   . Tachycardia   . Unstable angina ([place]) 09/09/2011  . Vitamin D deficiency   . Vocal cord dysfunction 2013   swelling    Past Surgical History:  Procedure Laterality Date  . ABDOMINAL HYSTERECTOMY  1988  . ANKLE FUSION Right    x 2  . BACK SURGERY  [phone]   3 ruptured discs, lower back  . BILATERAL ANTERIOR  TOTAL HIP ARTHROPLASTY Bilateral 11/12/2014   Procedure: BILATERAL ANTERIOR TOTAL HIP ARTHROPLASTY;  Surgeon: Paralee Cancel, MD;  Location: [facility];  Service: Orthopedics;  Laterality: Bilateral;  . BRAVO [place] STUDY [name] 03/06/2013   Procedure: BRAVO [place] STUDY;  Surgeon: Inda Castle, MD;  Location: [facility];  Service: Endoscopy;  Laterality: [name];  . CARDIAC CATHETERIZATION  2013   NORMAL  . CHOLECYSTECTOMY    . COLONOSCOPY    . ELBOW SURGERY Left    removed bone chip  . ESOPHAGOGASTRODUODENOSCOPY [name] 03/06/2013   Procedure: ESOPHAGOGASTRODUODENOSCOPY (EGD);  Surgeon: Inda Castle, MD;  Location: [facility];  Service: Endoscopy;  Laterality: [name];  . FOOT OSTEOTOMY W/ PLANTAR FASCIA RELEASE Left   . ILIOTIBIAL BAND RELEASE Right 1997  . JOINT REPLACEMENT    . LAPAROSCOPIC GASTRIC SLEEVE RESECTION WITH HIATAL HERNIA REPAIR  08/20/2014   Procedure: LAPAROSCOPIC GASTRIC SLEEVE RESECTION WITH HIATAL HERNIA  REPAIR AND  UPPER ENDOSCOPY;  Surgeon: Johnathan Hausen, MD;  Location: [facility];  Service: General;;  . LEFT HEART CATHETERIZATION WITH CORONARY ANGIOGRAM Bilateral 09/10/2011   Procedure: LEFT HEART CATHETERIZATION WITH CORONARY ANGIOGRAM;  Surgeon:  Lorretta Harp, MD;  Location: [facility] CATH LAB;  Service: Cardiovascular;  Laterality: Bilateral;  . NASAL SINUS SURGERY  1997  . REVERSE SHOULDER ARTHROPLASTY Right 05/14/2016   Procedure: RIGHT REVERSE SHOULDER ARTHROPLASTY;  Surgeon: Netta Cedars, MD;  Location: [facility];  Service: Orthopedics;  Laterality: Right;  . SHOULDER SURGERY Left   . tfc wrist Left 1997  . thumb replacement Bilateral    one 2009 and one 2014  . TOTAL ANKLE REPLACEMENT Right    x 3  . TOTAL ANKLE REPLACEMENT Left   . TOTAL ANKLE REPLACEMENT    . TOTAL HIP ARTHROPLASTY Bilateral   . TOTAL KNEE ARTHROPLASTY Bilateral    left 2002, right 2012  . TOTAL SHOULDER ARTHROPLASTY Right 05/14/2016  . TRANSTHORACIC ECHOCARDIOGRAM  09/09/2011   MILD CONCENTRIC LVH. TRACE MITRAL  REGURG. TRACE TR. MILD AORTIC REGURG.  . 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 UPPER GI ENDOSCOPY  08/20/2014   Procedure: UPPER GI ENDOSCOPY;  Surgeon: Johnathan Hausen, MD;  Location: [facility];  Service: General;;  . WRIST SURGERY Left 1997, 2009     There were no vitals filed for this visit.  Subjective Assessment - 06/28/17 1234    Subjective  Pt denied falls and was able [place] a little bit. Pt reports she has back around T12 (to the R side), no pain during palpation. Pt did HEP a few times since last visit.     Patient is accompained by:  Family member    Pertinent History  anxiety, OA, back pain, chest pain, depression, HTN, hyperlipidemia, IBS, Afib, sleep apnea, multiple surgeries including R ankle fusion, shoulder replacement, bilat hip replacements, back surgery, bilat knee replacements, and wrist surgery, obesity, bilat sensorineural hearing loss, tinnitus L ear, L ocular aneurysm with diplopia and multiple childhood ear infections    Patient Stated Goals  To prevent future falls, improved balance    Currently in Pain?  Yes    Pain Score  2     Pain Location  Back around T12 (to the R)    Pain Orientation  Right    Pain Descriptors / Indicators  Aching    Pain Type  Acute pain    Pain Onset  In the past 7 days    Pain Frequency  Constant    Aggravating Factors   unsure    Pain Relieving Factors  unsure               Neuro re-ed: Pt performed corner balance HEP and seated horizontal/vertical x1 viewing (only horizontal added to HEP). Cues and demo for technique. Pt reported slight incr. In dizziness and nausea but tolerable. Please see pt instructions for HEP details.         [facility] Adult PT Treatment/Exercise - 06/28/17 1312      Ambulation/Gait   Ambulation/Gait  Yes    Ambulation/Gait Assistance  5: Supervision;4: Min guard    Ambulation/Gait Assistance Details  Neuro re-ed: pt performed amb. on even terrain while performing head turns  with incr. sway and 2 LOB episodes (min guard), pt used stepping strategy and wall to correct balance. Cues to correct narrow BOS during LOB.     Ambulation Distance (Feet)  460 Feet    Assistive device  None    Gait Pattern  Narrow base of support;Step-through pattern;Decreased step length - right;Decreased step length - left;Decreased stride length    Ambulation Surface  Level;Indoor  PT Education - 06/28/17 1313    Education provided  Yes    Education Details  PT reviewed balance HEP, provided horizontal x1 viewing and asked pt to call eye doctor as visual changes are getting worse.     Person(s) Educated  Patient    Methods  Explanation;Demonstration;Verbal cues;Handout    Comprehension  Returned demonstration;Verbalized understanding;Need further instruction       PT Short Term Goals - 06/09/17 1224      PT SHORT TERM GOAL #1   Title  Pt will participate in further balance assessments: FGA and BERG    Time  4    Period  Weeks    Status  New    Target Date  07/09/17      PT SHORT TERM GOAL #2   Title  Pt will demonstrate increase in gait velocity to >/= 2.0 ft/sec    Baseline  1.8 ft/sec    Time  4    Period  Weeks    Status  New    Target Date  07/09/17      PT SHORT TERM GOAL #3   Title  Pt will demonstrate independence with initial vestibular/balance HEP    Time  4    Period  Weeks    Status  New    Target Date  07/09/17      PT SHORT TERM GOAL #4   Title  Pt will report 50% reduction in sensation of disequilibrium during gait (veering to R) and with quick turns    Baseline  veering occurs every day; 2 falls during quick turns    Time  4    Period  Weeks    Status  New    Target Date  07/09/17        PT Long Term Goals - 06/21/17 1327      PT LONG TERM GOAL #1   Title  Pt will demonstrate independence with walking program and final vestibular/balance HEP    Time  8    Period  Weeks    Status  New      PT LONG TERM GOAL #2   Title   Pt will improve gait velocity to >/= 2.4 ft/sec to decrease falls risk    Time  8    Period  Weeks    Status  New      PT LONG TERM GOAL #3   Title  Pt will improve FGA to >/=19/24 to decr. falls risk.     Baseline  8/30    Time  8    Period  Weeks    Status  Revised      PT LONG TERM GOAL #4   Title  Pt will improve BERG by 8 points (to 53/56) to decr. falls risk.    Baseline  45/56    Time  8    Period  Weeks    Status  Revised      PT LONG TERM GOAL #5   Title  Pt will ambulate x 300' over indoor surfaces with quick turns to L and R with 0/10 disequilibrium    Time  8    Period  Weeks    Status  New            Plan - 06/28/17 1314    Clinical Impression Statement  Pt demonstrated progress, as she was able to tolerate progression of balance HEP. Pt tolerated horizonal x1 viewing HEP well but was not able  to perform vertical x1 viewing 2/2 target doubled. Pt would continue to benefit from skilled PT to improve safety during functional mobility.     Rehab Potential  Good    PT Frequency  2x / week    PT Duration  12 weeks    PT Treatment/Interventions  Canalith Repostioning;ADLs/Self Care Home Management;Gait training;Functional mobility training;Therapeutic activities;Therapeutic exercise;Balance training;Neuromuscular re-education;Patient/family education;Vestibular    PT Next Visit Plan  Review initiate x 1 viewing training prn for L hypofunction, gait with head turns, balance activities which require vestibular input.     Consulted and Agree with Plan of Care  Patient       Patient will benefit from skilled therapeutic intervention in order to improve the following deficits and impairments:  Decreased balance, Decreased strength, Difficulty walking, Dizziness  Visit Diagnosis: Dizziness and giddiness  Unsteadiness on feet     Problem List Patient Active Problem List   Diagnosis Date Noted  . Colon cancer screening 03/24/2017  . Insulin resistance  02/15/2017  . Vitamin D deficiency 02/15/2017  . High serum vitamin B12 02/15/2017  . S/P shoulder replacement, right 05/14/2016  . Acute blood loss anemia 12/04/2014  . Obese 11/13/2014  . S/P bilateral THA, AA 11/12/2014  . Confusion 10/13/2014  . Acute confusional state 10/13/2014  . 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 Aftercare following joint replacement 03/28/2014  . History of artificial joint 03/28/2014  . Degenerative arthritis of hip 09/27/2013  . Paroxysmal atrial fibrillation ([place]) 09/07/2013  . Tachycardia 07/03/2013  . Snoring 07/03/2013  . Diverticulosis of colon without hemorrhage 06/26/2013  . RUQ abdominal pain 01/04/2013  . Nausea without vomiting 01/04/2013  . Hoarseness of voice 01/04/2013  . Diarrhea 01/04/2013  . Broken toe 03/09/2012  . Dyspnea 11/05/2011  . Upper airway cough syndrome 11/05/2011  . Asthma, persistent controlled 11/05/2011  . Morbid obesity with BMI of 45.0-49.9, adult ([place]) 11/05/2011  . HTN (hypertension) 09/09/2011  . Hyperlipemia 09/09/2011  . Bursitis, trochanteric 08/09/2011  . Ankle arthropathy 05/31/2011    Santasia Rew L 06/28/2017, 1:16 PM  [facility] [address] [place], [place], [address] Phone: [phone]   Fax:  [phone]  Name: [name] MRN: [id number] Date of Birth: [birth year]  Geoffry Paradise, PT,DPT 06/28/17 1:17 PM Phone: [phone] Fax: [phone]

## 2017-06-28 NOTE — Patient Instructions (Addendum)
Perform in corner with chair in front of for safety OR at kitchen sink with chair behind you:  Single Leg - Eyes Open    Holding support, lift right leg while maintaining balance over other leg. Squeeze bottom muscles together to engage glute medius muscle.  Progress to removing hands from support surface for longer periods of time. Hold__10__ seconds. Repeat with other leg.  Repeat __3__ times per session. Do __1__ sessions per day.  Copyright  VHI. All rights reserved.  Feet Apart, Varied Arm Positions - Eyes Closed    Stand with feet shoulder width apart and arms at your side. Close eyes and visualize upright position. Hold __30__ seconds. Repeat __3__ times per session. Do __1__ sessions per day.  Copyright  VHI. All rights reserved.  Feet Apart, Head Motion - Eyes Open    With eyes open, feet apart, move head slowly: up and down 5 times and side to side 5 times. Repeat __3__ times per session. Do __1__ sessions per day.  Copyright  VHI. All rights reserved.   Gaze Stabilization: Tip Card  1.Target must remain in focus, not blurry, and appear stationary while head is in motion. 2.Perform exercises with small head movements (45 to either side of midline). 3.Increase speed of head motion so long as target is in focus. 4.If you wear eyeglasses, be sure you can see target through lens (therapist will give specific instructions for bifocal / progressive lenses). 5.These exercises may provoke dizziness or nausea. Work through these symptoms. If too dizzy, slow head movement slightly. Rest between each exercise. 6.Exercises demand concentration; avoid distractions.  Copyright  VHI. All rights reserved.   Gaze Stabilization: Sitting    Keeping eyes on target on wall 3-5 feet away, tilt head down 15-30 and move head side to side for __20-30__ seconds.  Do __2__ sessions per day.  Copyright  VHI. All rights reserved.

## 2017-06-29 DIAGNOSIS — H4912 Fourth [trochlear] nerve palsy, left eye: Secondary | ICD-10-CM | POA: Diagnosis not present

## 2017-06-29 DIAGNOSIS — H532 Diplopia: Secondary | ICD-10-CM | POA: Diagnosis not present

## 2017-07-01 ENCOUNTER — Ambulatory Visit: Payer: Medicare Other | Admitting: Rehabilitative and Restorative Service Providers"

## 2017-07-01 ENCOUNTER — Encounter: Payer: Self-pay | Admitting: Rehabilitative and Restorative Service Providers"

## 2017-07-01 DIAGNOSIS — R42 Dizziness and giddiness: Secondary | ICD-10-CM

## 2017-07-01 DIAGNOSIS — M6281 Muscle weakness (generalized): Secondary | ICD-10-CM | POA: Diagnosis not present

## 2017-07-01 DIAGNOSIS — R2681 Unsteadiness on feet: Secondary | ICD-10-CM

## 2017-07-01 DIAGNOSIS — R296 Repeated falls: Secondary | ICD-10-CM | POA: Diagnosis not present

## 2017-07-01 NOTE — Patient Instructions (Signed)
Single Leg - Eyes Open    Holding support, lift right leg while maintaining balance over other leg. Squeeze bottom muscles together to engage glute medius muscle.  Progress to removing hands from support surface for longer periods of time. Hold__10__ seconds.Repeat with other leg. Repeat __3__ times per session. Do __1__ sessions per day.  Copyright  VHI. All rights reserved.  Feet Apart (Compliant Surface) Varied Arm Positions - Eyes Closed    Stand on compliant surface: _pillow  with feet shoulder width apart and arms at your side.  Close eyes and visualize upright position. Hold__30__ seconds. Repeat __3__ times per session. Do __1__ sessions per day.  Copyright  VHI. All rights reserved.    Feet Apart, Head Motion - Eyes Open    With eyes open, feet apart, move head slowly: up and down10 times and side to side 10 times. Do __1__ sessions per day.  Copyright  VHI. All rights reserved.  Gaze Stabilization: Tip Card  1.Target must remain in focus, not blurry, and appear stationary while head is in motion. 2.Perform exercises with small head movements (45 to either side of midline). 3.Increase speed of head motion so long as target is in focus. 4.If you wear eyeglasses, be sure you can see target through lens (therapist will give specific instructions for bifocal / progressive lenses). 5.These exercises may provoke dizziness or nausea. Work through these symptoms. If too dizzy, slow head movement slightly. Rest between each exercise. 6.Exercises demand concentration; avoid distractions.  Copyright  VHI. All rights reserved.   Gaze Stabilization - Standing Feet Apart   Feet shoulder width apart, keeping eyes on target on wall 3 feet away, tilt head down slightly and move head side to side for 30 seconds. Repeat while moving head up and down for 30 seconds. *Work up to tolerating 60 seconds, as able. Do 2-3 sessions per day.   *Stay within lenses (not over- it  should not double), and look out of top portion of lens.  Copyright  VHI. All rights reserved.

## 2017-07-01 NOTE — Therapy (Signed)
[facility] [address] [place], [place], [address] Phone: [phone]   Fax:  [phone]  Physical Therapy Treatment  Patient Details  Name: [name] MRN: [id number] Date of Birth: Jun 12, 1945 Referring Provider: Vicie Mutters, MD   Encounter Date: 07/01/2017  PT End of Session - 07/01/17 1553    Visit Number  4    Number of Visits  17    Date for PT Re-Evaluation  09/07/17    Authorization Type  Medicare and [employer] - Covered 100%    PT Start Time  1115    PT Stop Time  1200    PT Time Calculation (min)  45 min    Activity Tolerance  Patient tolerated treatment well min guard to S prn    Behavior During Therapy  [facility] for tasks assessed/performed       Past Medical History:  Diagnosis Date  . Allergy   . Anemia   . Antral polyp    benign  . Anxiety   . Arthritis   . Asthma   . B12 deficiency   . Back pain   . Chest pain   . Constipation   . Depression   . Diverticulosis   . Gallbladder problem   . GERD (gastroesophageal reflux disease) 09/09/2011  . History of transfusion   . HTN (hypertension) 09/09/2011  . Hyperlipemia 09/09/2011  . Hypertension   . IBS (irritable bowel syndrome)   . Internal hemorrhoid   . Joint pain   . Obesity   . Osteoarthritis   . Paroxysmal atrial fibrillation (HCC)   . PONV (postoperative nausea and vomiting)    with big surgeries, none with endos etc, with colonoscopy- 5-6 years ago could not swallow   . Shortness of breath   . Sleep apnea    borderline per patient no cpap   . SOB (shortness of breath) on exertion   . Swallowing difficulty   . Tachycardia   . Unstable angina ([place]) 09/09/2011  . Vitamin D deficiency   . Vocal cord dysfunction 2013   swelling    Past Surgical History:  Procedure Laterality Date  . ABDOMINAL HYSTERECTOMY  1988  . ANKLE FUSION Right    x 2  . BACK SURGERY  [phone]   3 ruptured discs, lower back  . BILATERAL ANTERIOR  TOTAL HIP ARTHROPLASTY Bilateral 11/12/2014   Procedure: BILATERAL ANTERIOR TOTAL HIP ARTHROPLASTY;  Surgeon: Paralee Cancel, MD;  Location: [facility];  Service: Orthopedics;  Laterality: Bilateral;  . BRAVO [place] STUDY [name] 03/06/2013   Procedure: BRAVO [place] STUDY;  Surgeon: Inda Castle, MD;  Location: [facility];  Service: Endoscopy;  Laterality: [name];  . CARDIAC CATHETERIZATION  2013   NORMAL  . CHOLECYSTECTOMY    . COLONOSCOPY    . ELBOW SURGERY Left    removed bone chip  . ESOPHAGOGASTRODUODENOSCOPY [name] 03/06/2013   Procedure: ESOPHAGOGASTRODUODENOSCOPY (EGD);  Surgeon: Inda Castle, MD;  Location: [facility];  Service: Endoscopy;  Laterality: [name];  . FOOT OSTEOTOMY W/ PLANTAR FASCIA RELEASE Left   . ILIOTIBIAL BAND RELEASE Right 1997  . JOINT REPLACEMENT    . LAPAROSCOPIC GASTRIC SLEEVE RESECTION WITH HIATAL HERNIA REPAIR  08/20/2014   Procedure: LAPAROSCOPIC GASTRIC SLEEVE RESECTION WITH HIATAL HERNIA  REPAIR AND  UPPER ENDOSCOPY;  Surgeon: Johnathan Hausen, MD;  Location: [facility];  Service: General;;  . LEFT HEART CATHETERIZATION WITH CORONARY ANGIOGRAM Bilateral 09/10/2011   Procedure: LEFT HEART CATHETERIZATION WITH CORONARY ANGIOGRAM;  Surgeon:  Lorretta Harp, MD;  Location: [facility] CATH LAB;  Service: Cardiovascular;  Laterality: Bilateral;  . NASAL SINUS SURGERY  1997  . REVERSE SHOULDER ARTHROPLASTY Right 05/14/2016   Procedure: RIGHT REVERSE SHOULDER ARTHROPLASTY;  Surgeon: Netta Cedars, MD;  Location: [facility];  Service: Orthopedics;  Laterality: Right;  . SHOULDER SURGERY Left   . tfc wrist Left 1997  . thumb replacement Bilateral    one 2009 and one 2014  . TOTAL ANKLE REPLACEMENT Right    x 3  . TOTAL ANKLE REPLACEMENT Left   . TOTAL ANKLE REPLACEMENT    . TOTAL HIP ARTHROPLASTY Bilateral   . TOTAL KNEE ARTHROPLASTY Bilateral    left 2002, right 2012  . TOTAL SHOULDER ARTHROPLASTY Right 05/14/2016  . TRANSTHORACIC ECHOCARDIOGRAM  09/09/2011   MILD CONCENTRIC LVH. TRACE MITRAL  REGURG. TRACE TR. MILD AORTIC REGURG.  . 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 UPPER GI ENDOSCOPY  08/20/2014   Procedure: UPPER GI ENDOSCOPY;  Surgeon: Johnathan Hausen, MD;  Location: [facility];  Service: General;;  . WRIST SURGERY Left 1997, 2009     There were no vitals filed for this visit.  Subjective Assessment - 07/01/17 1121    Subjective  The patient reports that she saw her optometrist yesterday and she put a temporary prism in her L lens and this has resolved double vision.  She reports she veers to the side with gait activities.     Patient is accompained by:  Family member    Pertinent History  anxiety, OA, back pain, chest pain, depression, HTN, hyperlipidemia, IBS, Afib, sleep apnea, multiple surgeries including R ankle fusion, shoulder replacement, bilat hip replacements, back surgery, bilat knee replacements, and wrist surgery, obesity, bilat sensorineural hearing loss, tinnitus L ear, L ocular aneurysm with diplopia and multiple childhood ear infections    Patient Stated Goals  To prevent future falls, improved balance    Currently in Pain?  Yes    Pain Score  -- "I just know its there"    Pain Location  Back    Pain Descriptors / Indicators  Aching    Pain Type  Chronic pain    Pain Onset  More than a month ago    Pain Frequency  Constant    Aggravating Factors   movement related                       [facility] Adult PT Treatment/Exercise - 07/01/17 1554      Ambulation/Gait   Ambulation/Gait  Yes    Ambulation/Gait Assistance  5: Supervision;4: Min guard    Ambulation/Gait Assistance Details  Gait activities on level surfaces initially performing a small ball toss with gait x 230 feet, then performing head motion tracking the ball in vertical plane with CGA and then in horizontal plane.      Assistive device  None    Gait Pattern  Narrow base of support;Step-through pattern;Decreased step length - right;Decreased step length - left;Decreased stride  length    Ambulation Surface  Level;Indoor      Neuro Re-ed    Neuro Re-ed Details   Corner standing exercises including feet apart with head motion progressing to feet together x 10 reps horiz and vertical plane.  Wide base with eyes closed, progressed to narrow base then to compliant surface with feet apart + eyes closed x 30 seconds near corner for support.  Patient able to safely perform- progressed for  HEP.  Standing lateral weight shift R<>L and return to midline while moving head to spot objects R and L for greater postural challenge.  Sidestepping x 10 feet x 3 reps R<>L without UE support with close supervision to [place].  Standing horizontal ball pass R<>L with therapist standing posterior to patient x 5 reps (provokes an off balance sensation).  Rocker board standing in parallel bars releasing with one UE at a time and then both performing alternating reaching tasks, with min A.  Then progressed to static standing on rocker board with eyes closed.      Vestibular Treatment/Exercise - 07/01/17 1556      Vestibular Treatment/Exercise   Vestibular Treatment Provided  Gaze    Gaze Exercises  X1 Viewing Horizontal      X1 Viewing Horizontal   Foot Position  seated, progressed to standing    Reps  3    Comments  15 seconds x 3 reps with eyewear (new L prism sticker) donned, with cues to use top portion of lens (distance lenses) and to stay below upper frame of glasses.  Also provided cues on cervical positioning for activity.  PT provided tactile cues for corect speed and discussed visual fixation t/o activity.            PT Education - 07/01/17 1203    Education provided  Yes    Education Details  modified HEP: progressed feet apart + eyes closed on pillow, VOR x 1 standing, continue SLS, head motion in standing increased to 10 reps.    Person(s) Educated  Patient;Spouse    Methods  Explanation;Demonstration;Handout    Comprehension  Returned demonstration;Verbalized understanding        PT Short Term Goals - 06/09/17 1224      PT SHORT TERM GOAL #1   Title  Pt will participate in further balance assessments: FGA and BERG    Time  4    Period  Weeks    Status  New    Target Date  07/09/17      PT SHORT TERM GOAL #2   Title  Pt will demonstrate increase in gait velocity to >/= 2.0 ft/sec    Baseline  1.8 ft/sec    Time  4    Period  Weeks    Status  New    Target Date  07/09/17      PT SHORT TERM GOAL #3   Title  Pt will demonstrate independence with initial vestibular/balance HEP    Time  4    Period  Weeks    Status  New    Target Date  07/09/17      PT SHORT TERM GOAL #4   Title  Pt will report 50% reduction in sensation of disequilibrium during gait (veering to R) and with quick turns    Baseline  veering occurs every day; 2 falls during quick turns    Time  4    Period  Weeks    Status  New    Target Date  07/09/17        PT Long Term Goals - 06/21/17 1327      PT LONG TERM GOAL #1   Title  Pt will demonstrate independence with walking program and final vestibular/balance HEP    Time  8    Period  Weeks    Status  New      PT LONG TERM GOAL #2   Title  Pt will improve gait velocity to >/=  2.4 ft/sec to decrease falls risk    Time  8    Period  Weeks    Status  New      PT LONG TERM GOAL #3   Title  Pt will improve FGA to >/=19/24 to decr. falls risk.     Baseline  8/30    Time  8    Period  Weeks    Status  Revised      PT LONG TERM GOAL #4   Title  Pt will improve BERG by 8 points (to 53/56) to decr. falls risk.    Baseline  45/56    Time  8    Period  Weeks    Status  Revised      PT LONG TERM GOAL #5   Title  Pt will ambulate x 300' over indoor surfaces with quick turns to L and R with 0/10 disequilibrium    Time  8    Period  Weeks    Status  New            Plan - 07/01/17 1601    Clinical Impression Statement  PT was able to progress HEP further today modifying position to standing with VOR x 1 viewing and  adding compliant surface to eyes closed for HEP.  Also increased reps on balance + head motion activities.  Patient has significant improvement per report after getting prism sticker added to lenswear (temporary measure).  PT to continue to progress to STGs/LTGs.     PT Treatment/Interventions  [place]al[place] Repostioning;ADLs/Self Care Home Management;Gait training;Functional mobility training;Therapeutic activities;Therapeutic exercise;Balance training;Neuromuscular re-education;Patient/family education;Vestibular    PT Next Visit Plan  Progress to more compliant surface training in PT adding head motion as able, turns during gait, dynamic gait activities, VOr progression.    Consulted and Agree with Plan of Care  Patient       Patient will benefit from skilled therapeutic intervention in order to improve the following deficits and impairments:  Decreased balance, Decreased strength, Difficulty walking, Dizziness  Visit Diagnosis: Dizziness and giddiness  Unsteadiness on feet  Muscle weakness (generalized)     Problem List Patient Active Problem List   Diagnosis Date Noted  . Colon cancer screening 03/24/2017  . Insulin resistance 02/15/2017  . Vitamin D deficiency 02/15/2017  . High serum vitamin B12 02/15/2017  . S/P shoulder replacement, right 05/14/2016  . Acute blood loss anemia 12/04/2014  . Obese 11/13/2014  . S/P bilateral THA, AA 11/12/2014  . Confusion 10/13/2014  . Acute confusional state 10/13/2014  . 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 Aftercare following joint replacement 03/28/2014  . History of artificial joint 03/28/2014  . Degenerative arthritis of hip 09/27/2013  . Paroxysmal atrial fibrillation ([place]) 09/07/2013  . Tachycardia 07/03/2013  . Snoring 07/03/2013  . Diverticulosis of colon without hemorrhage 06/26/2013  . RUQ abdominal pain 01/04/2013  . Nausea without vomiting 01/04/2013  . Hoarseness of voice 01/04/2013  . Diarrhea  01/04/2013  . Broken toe 03/09/2012  . Dyspnea 11/05/2011  . Upper airway cough syndrome 11/05/2011  . Asthma, persistent controlled 11/05/2011  . Morbid obesity with BMI of 45.0-49.9, adult ([place]) 11/05/2011  . HTN (hypertension) 09/09/2011  . Hyperlipemia 09/09/2011  . Bursitis, trochanteric 08/09/2011  . Ankle arthropathy 05/31/2011    Chelbi Herber, PT 07/01/2017, 4:03 PM  [facility] [address] [place], [place], [address] Phone: [phone]   Fax:  [phone]  Name: [name] MRN:  [id number] Date of Birth: 07-25-1945

## 2017-07-04 ENCOUNTER — Ambulatory Visit: Payer: Medicare Other

## 2017-07-04 DIAGNOSIS — Z6835 Body mass index (BMI) 35.0-35.9, adult: Secondary | ICD-10-CM | POA: Diagnosis not present

## 2017-07-04 DIAGNOSIS — M6281 Muscle weakness (generalized): Secondary | ICD-10-CM

## 2017-07-04 DIAGNOSIS — N200 Calculus of kidney: Secondary | ICD-10-CM | POA: Diagnosis not present

## 2017-07-04 DIAGNOSIS — R2681 Unsteadiness on feet: Secondary | ICD-10-CM | POA: Diagnosis not present

## 2017-07-04 DIAGNOSIS — R296 Repeated falls: Secondary | ICD-10-CM | POA: Diagnosis not present

## 2017-07-04 DIAGNOSIS — M546 Pain in thoracic spine: Secondary | ICD-10-CM | POA: Diagnosis not present

## 2017-07-04 DIAGNOSIS — R42 Dizziness and giddiness: Secondary | ICD-10-CM | POA: Diagnosis not present

## 2017-07-04 DIAGNOSIS — N390 Urinary tract infection, site not specified: Secondary | ICD-10-CM | POA: Diagnosis not present

## 2017-07-04 NOTE — Therapy (Signed)
[facility] [address] [place], [place], [address] Phone: [phone]   Fax:  [phone]  Physical Therapy Treatment  Patient Details  Name: [name] MRN: [id number] Date of Birth: 03-07-1946 Referring Provider: Vicie Mutters, MD   Encounter Date: 07/04/2017  PT End of Session - 07/04/17 1216    Visit Number  5    Number of Visits  17    Date for PT Re-Evaluation  09/07/17    Authorization Type  Medicare and AARP - Covered 100%    PT Start Time  1104    PT Stop Time  1148    PT Time Calculation (min)  44 min    Equipment Utilized During Treatment  Gait belt    Activity Tolerance  Patient tolerated treatment well    Behavior During Therapy  WFL for tasks assessed/performed       Past Medical History:  Diagnosis Date  . Allergy   . Anemia   . Antral polyp    benign  . Anxiety   . Arthritis   . Asthma   . B12 deficiency   . Back pain   . Chest pain   . Constipation   . Depression   . Diverticulosis   . Gallbladder problem   . GERD (gastroesophageal reflux disease) 09/09/2011  . History of transfusion   . HTN (hypertension) 09/09/2011  . Hyperlipemia 09/09/2011  . Hypertension   . IBS (irritable bowel syndrome)   . Internal hemorrhoid   . Joint pain   . Obesity   . Osteoarthritis   . Paroxysmal atrial fibrillation (HCC)   . PONV (postoperative nausea and vomiting)    with big surgeries, none with endos etc, with colonoscopy- 5-6 years ago could not swallow   . Shortness of breath   . Sleep apnea    borderline per patient no cpap   . SOB (shortness of breath) on exertion   . Swallowing difficulty   . Tachycardia   . Unstable angina ([place]) 09/09/2011  . Vitamin D deficiency   . Vocal cord dysfunction 2013   swelling    Past Surgical History:  Procedure Laterality Date  . ABDOMINAL HYSTERECTOMY  1988  . ANKLE FUSION Right    x 2  . BACK SURGERY  [phone]   3 ruptured discs, lower  back  . BILATERAL ANTERIOR TOTAL HIP ARTHROPLASTY Bilateral 11/12/2014   Procedure: BILATERAL ANTERIOR TOTAL HIP ARTHROPLASTY;  Surgeon: Paralee Cancel, MD;  Location: [facility];  Service: Orthopedics;  Laterality: Bilateral;  . BRAVO [place] STUDY [name] 03/06/2013   Procedure: BRAVO [place] STUDY;  Surgeon: Inda Castle, MD;  Location: [facility];  Service: Endoscopy;  Laterality: [name];  . CARDIAC CATHETERIZATION  2013   NORMAL  . CHOLECYSTECTOMY    . COLONOSCOPY    . ELBOW SURGERY Left    removed bone chip  . ESOPHAGOGASTRODUODENOSCOPY [name] 03/06/2013   Procedure: ESOPHAGOGASTRODUODENOSCOPY (EGD);  Surgeon: Inda Castle, MD;  Location: [facility];  Service: Endoscopy;  Laterality: [name];  . FOOT OSTEOTOMY W/ PLANTAR FASCIA RELEASE Left   . ILIOTIBIAL BAND RELEASE Right 1997  . JOINT REPLACEMENT    . LAPAROSCOPIC GASTRIC SLEEVE RESECTION WITH HIATAL HERNIA REPAIR  08/20/2014   Procedure: LAPAROSCOPIC GASTRIC SLEEVE RESECTION WITH HIATAL HERNIA  REPAIR AND  UPPER ENDOSCOPY;  Surgeon: Johnathan Hausen, MD;  Location: [facility];  Service: General;;  . LEFT HEART CATHETERIZATION WITH CORONARY ANGIOGRAM Bilateral 09/10/2011   Procedure: LEFT HEART CATHETERIZATION  WITH CORONARY ANGIOGRAM;  Surgeon: Lorretta Harp, MD;  Location: [facility] C[facility] LAB;  Service: Cardiovascular;  Laterality: Bilateral;  . NASAL SINUS SURGERY  1997  . REVERSE SHOULDER ARTHROPLASTY Right 05/14/2016   Procedure: RIGHT REVERSE SHOULDER ARTHROPLASTY;  Surgeon: Netta Cedars, MD;  Location: [facility];  Service: Orthopedics;  Laterality: Right;  . SHOULDER SURGERY Left   . tfc wrist Left 1997  . thumb replacement Bilateral    one 2009 and one 2014  . TOTAL ANKLE REPLACEMENT Right    x 3  . TOTAL ANKLE REPLACEMENT Left   . TOTAL ANKLE REPLACEMENT    . TOTAL HIP ARTHROPLASTY Bilateral   . TOTAL KNEE ARTHROPLASTY Bilateral    left 2002, right 2012  . TOTAL SHOULDER ARTHROPLASTY Right 05/14/2016  . TRANSTHORACIC ECHOCARDIOGRAM  09/09/2011   MILD  CONCENTRIC LVH. TRACE MITRAL REGURG. TRACE TR. MILD AORTIC REGURG.  . 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 UPPER GI ENDOSCOPY  08/20/2014   Procedure: UPPER GI ENDOSCOPY;  Surgeon: Johnathan Hausen, MD;  Location: [facility];  Service: General;;  . WRIST SURGERY Left 1997, 2009     There were no vitals filed for this visit.  Subjective Assessment - 07/04/17 1104    Subjective  Pt reports Mid/low Rt. back pain that has continued to incraese over the last week. Pt reports she made an appointment with her GP to have it assessed today 1:15pm. Pt reports temporary prism in glasses has improved double vision and unsteadiness. Pt wants to perform PT today despite pain.     Pertinent History  anxiety, OA, back pain, chest pain, depression, HTN, hyperlipidemia, IBS, Afib, sleep apnea, multiple surgeries including R ankle fusion, shoulder replacement, bilat hip replacements, back surgery, bilat knee replacements, and wrist surgery, obesity, bilat sensorineural hearing loss, tinnitus L ear, L ocular aneurysm with diplopia and multiple childhood ear infections    Limitations  Walking    Patient Stated Goals  To prevent future falls, improved balance    Currently in Pain?  Yes    Pain Score  5     Pain Descriptors / Indicators  Sore;Sharp;Stabbing    Aggravating Factors   Sneeze and cough          [facility] PT Assessment - 07/04/17 1104      Berg Balance Test   Sit to Stand  Able to stand without using hands and stabilize independently    Standing Unsupported  Able to stand safely 2 minutes    Sitting with Back Unsupported but Feet Supported on Floor or Stool  Able to sit safely and securely 2 minutes    Stand to Sit  Sits safely with minimal use of hands    Transfers  Able to transfer safely, minor use of hands    Standing Unsupported with Eyes Closed  Able to stand 10 seconds safely    Standing Ubsupported with Feet Together  Able to place feet together independently and stand 1 minute safely     From Standing, Reach Forward with Outstretched Arm  Can reach confidently >25 cm (10")    From Standing Position, Pick up Object from Floor  Able to pick up shoe safely and easily    From Standing Position, Turn to Look Behind Over each Shoulder  Looks behind from both sides and weight shifts well    Turn 360 Degrees  Able to turn 360 degrees safely in 4 seconds or less    Standing Unsupported,  Alternately Place Feet on Step/Stool  Able to complete 4 steps without aid or supervision    Standing Unsupported, One Foot in Front  Able to take small step independently and hold 30 seconds    Standing on One Leg  Able to lift leg independently and hold equal to or more than 3 seconds    Total Score  50    Berg comment:  last time was 44/56      Functional Gait  Assessment   Gait assessed   Yes    Gait Level Surface  Walks 20 ft, slow speed, abnormal gait pattern, evidence for imbalance or deviates 10-15 in outside of the 12 in walkway width. Requires more than 7 sec to ambulate 20 ft.    Change in Gait Speed  Able to change speed, demonstrates mild gait deviations, deviates 6-10 in outside of the 12 in walkway width, or no gait deviations, unable to achieve a major change in velocity, or uses a change in velocity, or uses an assistive device.    Gait with Horizontal Head Turns  Performs head turns smoothly with slight change in gait velocity (eg, minor disruption to smooth gait path), deviates 6-10 in outside 12 in walkway width, or uses an assistive device.    Gait with Vertical Head Turns  Performs task with moderate change in gait velocity, slows down, deviates 10-15 in outside 12 in walkway width but recovers, can continue to walk.    Gait and Pivot Turn  Turns slowly, requires verbal cueing, or requires several small steps to catch balance following turn and stop    Step Over Obstacle  Is able to step over one shoe box (4.5 in total height) but must slow down and adjust steps to clear box safely. May  require verbal cueing.    Gait with Narrow Base of Support  Ambulates less than 4 steps heel to toe or cannot perform without assistance.    Gait with Eyes Closed  Walks 20 ft, slow speed, abnormal gait pattern, evidence for imbalance, deviates 10-15 in outside 12 in walkway width. Requires more than 9 sec to ambulate 20 ft.    Ambulating Backwards  Walks 20 ft, uses assistive device, slower speed, mild gait deviations, deviates 6-10 in outside 12 in walkway width.    Steps  Two feet to a stair, must use rail.    Total Score  12                   [facility] Adult PT Treatment/Exercise - 07/04/17 1104      Transfers   Transfers  Sit to Stand;Stand to Sit    Sit to Stand  6: Modified independent (Device/Increase time)    Stand to Sit  6: Modified independent (Device/Increase time)      Ambulation/Gait   Ambulation/Gait  Yes    Ambulation/Gait Assistance  5: Supervision    Ambulation Distance (Feet)  150 Feet    Gait Pattern  Narrow base of support;Step-through pattern;Decreased step length - right;Decreased step length - left;Decreased stride length    Ambulation Surface  Level;Indoor             PT Education - 07/04/17 1214    Education provided  Yes    Education Details  Prism glasses, HEP, pt pain signs and symptoms and to follow up with PT after her MD appointment in order to safely progress PT.     Person(s) Educated  Patient;Spouse    Methods  Explanation;Demonstration    Comprehension  Verbalized understanding;Returned demonstration       PT Short Term Goals - 07/04/17 1217      PT SHORT TERM GOAL #1   Title  Pt will participate in further balance assessments: FGA and BERG    Baseline  Met 07/04/2017: Berg 50/56, FGA 12/30. Last assessment 44/56 BERG and 8/30 FGA( scores today may have  been impacted by recent increasein Posterolateral trunk pain.     Time  4    Period  Weeks    Status  Achieved      PT SHORT TERM GOAL #2   Title  Pt will demonstrate  increase in gait velocity to >/= 2.0 ft/sec    Baseline  1.8 ft/sec    Time  4    Period  Weeks    Status  New      PT SHORT TERM GOAL #3   Title  Pt will demonstrate independence with initial vestibular/balance HEP    Time  4    Period  Weeks    Status  New      PT SHORT TERM GOAL #4   Title  Pt will report 50% reduction in sensation of disequilibrium during gait (veering to R) and with quick turns    Baseline  veering occurs every day; 2 falls during quick turns    Time  4    Period  Weeks    Status  New        PT Long Term Goals - 06/21/17 1327      PT LONG TERM GOAL #1   Title  Pt will demonstrate independence with walking program and final vestibular/balance HEP    Time  8    Period  Weeks    Status  New      PT LONG TERM GOAL #2   Title  Pt will improve gait velocity to >/= 2.4 ft/sec to decrease falls risk    Time  8    Period  Weeks    Status  New      PT LONG TERM GOAL #3   Title  Pt will improve FGA to >/=19/24 to decr. falls risk.     Baseline  8/30    Time  8    Period  Weeks    Status  Revised      PT LONG TERM GOAL #4   Title  Pt will improve BERG by 8 points (to 53/56) to decr. falls risk.    Baseline  45/56    Time  8    Period  Weeks    Status  Revised      PT LONG TERM GOAL #5   Title  Pt will ambulate x 300' over indoor surfaces with quick turns to L and R with 0/10 disequilibrium    Time  8    Period  Weeks    Status  New           Plan - 07/04/17 1225    Clinical Impression Statement  During today's PT session patient presents to PT with significant back pain that has progressively gotten worse to this point. Despite pain, pt wanted to continue PT. PT assessed pt's BERG and FGA scores, to determine if pt's balance is improved with L lens prism. Pt demonstrated significant improvement in her functional mobility regardless of new progressive back pain.   Demonstrated with FGA score of 12/30 and BERG balance score of 50/56. Today's  session  was limited due to patients LBP. Patient was instructed to report back with  any significant information( or have GP send [place]  necessary documentation of any significant change her medical status..     Rehab Potential  Good    PT Frequency  2x / week    PT Duration  12 weeks    PT Treatment/Interventions  [place] Repostioning;ADLs/Self Care Home Management;Gait training;Functional mobility training;Therapeutic activities;Therapeutic exercise;Balance training;Neuromuscular re-education;Patient/family education;Vestibular    PT Next Visit Plan  Finish assessing STGs. Progress to more compliant surface training in PT adding head motion as able, turns during gait, dynamic gait activities, VOr progression.    Consulted and Agree with Plan of Care  Patient           Patient will benefit from skilled therapeutic intervention in order to improve the following deficits and impairments:  Decreased balance, Decreased strength, Difficulty walking, Dizziness  Visit Diagnosis: Dizziness and giddiness  Unsteadiness on feet  Muscle weakness (generalized)  Repeated falls     Problem List Patient Active Problem List   Diagnosis Date Noted  . Colon cancer screening 03/24/2017  . Insulin resistance 02/15/2017  . Vitamin D deficiency 02/15/2017  . High serum vitamin B12 02/15/2017  . S/P shoulder replacement, right 05/14/2016  . Acute blood loss anemia 12/04/2014  . Obese 11/13/2014  . S/P bilateral THA, AA 11/12/2014  . Confusion 10/13/2014  . Acute confusional state 10/13/2014  . 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 Aftercare following joint replacement 03/28/2014  . History of artificial joint 03/28/2014  . Degenerative arthritis of hip 09/27/2013  . Paroxysmal atrial fibrillation ([place]) 09/07/2013  . Tachycardia 07/03/2013  . Snoring 07/03/2013  . Diverticulosis of colon without hemorrhage 06/26/2013  . RUQ abdominal pain 01/04/2013  . Nausea without  vomiting 01/04/2013  . Hoarseness of voice 01/04/2013  . Diarrhea 01/04/2013  . Broken toe 03/09/2012  . Dyspnea 11/05/2011  . Upper airway cough syndrome 11/05/2011  . Asthma, persistent controlled 11/05/2011  . Morbid obesity with BMI of 45.0-49.9, adult ([place]) 11/05/2011  . HTN (hypertension) 09/09/2011  . Hyperlipemia 09/09/2011  . Bursitis, trochanteric 08/09/2011  . Ankle arthropathy 05/31/2011    Waunita Schooner  SPT 07/04/2017, 4:01 PM  [facility] [address] [place], [place], [address] Phone: [phone]   Fax:  [phone]  Name: HEIDIE KRALL MRN: [id number] Date of Birth: Apr 05, 1945

## 2017-07-05 ENCOUNTER — Other Ambulatory Visit: Payer: Self-pay | Admitting: Internal Medicine

## 2017-07-05 DIAGNOSIS — R1084 Generalized abdominal pain: Secondary | ICD-10-CM | POA: Diagnosis not present

## 2017-07-05 DIAGNOSIS — N200 Calculus of kidney: Secondary | ICD-10-CM

## 2017-07-06 ENCOUNTER — Encounter: Payer: Self-pay | Admitting: Rehabilitative and Restorative Service Providers"

## 2017-07-06 DIAGNOSIS — R319 Hematuria, unspecified: Secondary | ICD-10-CM | POA: Diagnosis not present

## 2017-07-06 DIAGNOSIS — R1084 Generalized abdominal pain: Secondary | ICD-10-CM | POA: Diagnosis not present

## 2017-07-06 DIAGNOSIS — R3915 Urgency of urination: Secondary | ICD-10-CM | POA: Diagnosis not present

## 2017-07-07 ENCOUNTER — Encounter (INDEPENDENT_AMBULATORY_CARE_PROVIDER_SITE_OTHER): Payer: Self-pay | Admitting: Family Medicine

## 2017-07-07 ENCOUNTER — Ambulatory Visit (HOSPITAL_COMMUNITY): Payer: Medicare Other

## 2017-07-08 ENCOUNTER — Other Ambulatory Visit: Payer: Self-pay

## 2017-07-11 ENCOUNTER — Ambulatory Visit: Payer: Medicare Other | Admitting: Physical Therapy

## 2017-07-14 ENCOUNTER — Encounter: Payer: Self-pay | Admitting: Physical Therapy

## 2017-07-18 ENCOUNTER — Ambulatory Visit: Payer: Medicare Other | Admitting: Physical Therapy

## 2017-07-20 ENCOUNTER — Encounter: Payer: Self-pay | Admitting: Physical Therapy

## 2017-08-01 ENCOUNTER — Encounter: Payer: Self-pay | Admitting: Physical Therapy

## 2017-08-01 ENCOUNTER — Ambulatory Visit: Payer: Medicare Other | Attending: Otolaryngology | Admitting: Physical Therapy

## 2017-08-01 DIAGNOSIS — R42 Dizziness and giddiness: Secondary | ICD-10-CM | POA: Diagnosis not present

## 2017-08-01 DIAGNOSIS — M6281 Muscle weakness (generalized): Secondary | ICD-10-CM | POA: Insufficient documentation

## 2017-08-01 DIAGNOSIS — R2681 Unsteadiness on feet: Secondary | ICD-10-CM | POA: Diagnosis not present

## 2017-08-01 DIAGNOSIS — R296 Repeated falls: Secondary | ICD-10-CM | POA: Diagnosis not present

## 2017-08-01 NOTE — Therapy (Signed)
[facility] [address] [place], [place], [address] Phone: [phone]   Fax:  [phone]  Physical Therapy Treatment  Patient Details  Name: [name] MRN: [id number] Date of Birth: 1945/09/03 Referring Provider: Vicie Mutters, MD   Encounter Date: 08/01/2017  PT End of Session - 08/01/17 1237    Visit Number  6    Number of Visits  17    Date for PT Re-Evaluation  09/07/17    Authorization Type  Medicare and [employer] - Covered 100%    PT Start Time  1150    PT Stop Time  1230    PT Time Calculation (min)  40 min    Activity Tolerance  Patient tolerated treatment well    Behavior During Therapy  [facility] for tasks assessed/performed       Past Medical History:  Diagnosis Date  . Allergy   . Anemia   . Antral polyp    benign  . Anxiety   . Arthritis   . Asthma   . B12 deficiency   . Back pain   . Chest pain   . Constipation   . Depression   . Diverticulosis   . Gallbladder problem   . GERD (gastroesophageal reflux disease) 09/09/2011  . History of transfusion   . HTN (hypertension) 09/09/2011  . Hyperlipemia 09/09/2011  . Hypertension   . IBS (irritable bowel syndrome)   . Internal hemorrhoid   . Joint pain   . Obesity   . Osteoarthritis   . Paroxysmal atrial fibrillation (HCC)   . PONV (postoperative nausea and vomiting)    with big surgeries, none with endos etc, with colonoscopy- 5-6 years ago could not swallow   . Shortness of breath   . Sleep apnea    borderline per patient no cpap   . SOB (shortness of breath) on exertion   . Swallowing difficulty   . Tachycardia   . Unstable angina ([place]) 09/09/2011  . Vitamin D deficiency   . Vocal cord dysfunction 2013   swelling    Past Surgical History:  Procedure Laterality Date  . ABDOMINAL HYSTERECTOMY  1988  . ANKLE FUSION Right    x 2  . BACK SURGERY  [phone]   3 ruptured discs, lower back  . BILATERAL ANTERIOR TOTAL HIP ARTHROPLASTY  Bilateral 11/12/2014   Procedure: BILATERAL ANTERIOR TOTAL HIP ARTHROPLASTY;  Surgeon: Paralee Cancel, MD;  Location: [facility];  Service: Orthopedics;  Laterality: Bilateral;  . BRAVO [place] STUDY [name] 03/06/2013   Procedure: BRAVO [place] STUDY;  Surgeon: Inda Castle, MD;  Location: [facility];  Service: Endoscopy;  Laterality: [name];  . CARDIAC CATHETERIZATION  2013   NORMAL  . CHOLECYSTECTOMY    . COLONOSCOPY    . ELBOW SURGERY Left    removed bone chip  . ESOPHAGOGASTRODUODENOSCOPY [name] 03/06/2013   Procedure: ESOPHAGOGASTRODUODENOSCOPY (EGD);  Surgeon: Inda Castle, MD;  Location: [facility];  Service: Endoscopy;  Laterality: [name];  . FOOT OSTEOTOMY W/ PLANTAR FASCIA RELEASE Left   . ILIOTIBIAL BAND RELEASE Right 1997  . JOINT REPLACEMENT    . LAPAROSCOPIC GASTRIC SLEEVE RESECTION WITH HIATAL HERNIA REPAIR  08/20/2014   Procedure: LAPAROSCOPIC GASTRIC SLEEVE RESECTION WITH HIATAL HERNIA  REPAIR AND  UPPER ENDOSCOPY;  Surgeon: Johnathan Hausen, MD;  Location: [facility];  Service: General;;  . LEFT HEART CATHETERIZATION WITH CORONARY ANGIOGRAM Bilateral 09/10/2011   Procedure: LEFT HEART CATHETERIZATION WITH CORONARY ANGIOGRAM;  Surgeon: Lorretta Harp, MD;  Location: [facility];  Service: Cardiovascular;  Laterality: Bilateral;  . NASAL SINUS SURGERY  1997  . REVERSE SHOULDER ARTHROPLASTY Right 05/14/2016   Procedure: RIGHT REVERSE SHOULDER ARTHROPLASTY;  Surgeon: Netta Cedars, MD;  Location: [facility];  Service: Orthopedics;  Laterality: Right;  . SHOULDER SURGERY Left   . tfc wrist Left 1997  . thumb replacement Bilateral    one 2009 and one 2014  . TOTAL ANKLE REPLACEMENT Right    x 3  . TOTAL ANKLE REPLACEMENT Left   . TOTAL ANKLE REPLACEMENT    . TOTAL HIP ARTHROPLASTY Bilateral   . TOTAL KNEE ARTHROPLASTY Bilateral    left 2002, right 2012  . TOTAL SHOULDER ARTHROPLASTY Right 05/14/2016  . TRANSTHORACIC ECHOCARDIOGRAM  09/09/2011   MILD CONCENTRIC LVH. TRACE MITRAL REGURG. TRACE TR. MILD  AORTIC REGURG.  . 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 UPPER GI ENDOSCOPY  08/20/2014   Procedure: UPPER GI ENDOSCOPY;  Surgeon: Johnathan Hausen, MD;  Location: [facility];  Service: General;;  . WRIST SURGERY Left 1997, 2009     There were no vitals filed for this visit.  Subjective Assessment - 08/01/17 1154    Subjective  At last visit pt left in significant pain in low back; went to PCP who said pt had a kidney stone.  Went to urology who said pt did not have a kidney stone and felt the pain was musculoskeletal and was placed on muscle relaxers.  Pt reports minimal improvement in low back pain even with mm relaxer.  No further vertigo or falls, still have temporary prism in glasses.    Pertinent History  anxiety, OA, back pain, chest pain, depression, HTN, hyperlipidemia, IBS, Afib, sleep apnea, multiple surgeries including R ankle fusion, shoulder replacement, bilat hip replacements, back surgery, bilat knee replacements, and wrist surgery, obesity, bilat sensorineural hearing loss, tinnitus L ear, L ocular aneurysm with diplopia and multiple childhood ear infections    Limitations  Walking    Patient Stated Goals  To prevent future falls, improved balance    Currently in Pain?  Yes    Pain Score  2     Pain Location  Back    Pain Orientation  Left;Mid    Pain Descriptors / Indicators  Sharp;Aching    Pain Type  Acute pain    Pain Onset  More than a month ago    Pain Frequency  Intermittent         [facility] PT Assessment - 08/01/17 1201      Ambulation/Gait   Ambulation/Gait  Yes    Ambulation/Gait Assistance  6: Modified independent (Device/Increase time)    Ambulation/Gait Assistance Details  with multiple L and R turns    Ambulation Distance (Feet)  300 Feet    Assistive device  None    Ambulation Surface  Level;Indoor    Gait Comments  no reports of dizziness or wooziness      Standardized Balance Assessment   Standardized Balance Assessment  Berg Balance Test;10 meter  walk test    10 Meter Walk  15.34 seconds or 2.13 ft/sec      Berg Balance Test   Sit to Stand  Able to stand without using hands and stabilize independently    Standing Unsupported  Able to stand safely 2 minutes    Sitting with Back Unsupported but Feet Supported on Floor or Stool  Able to sit safely and securely 2 minutes    Stand  to Sit  Sits safely with minimal use of hands    Transfers  Able to transfer safely, minor use of hands    Standing Unsupported with Eyes Closed  Able to stand 10 seconds safely    Standing Ubsupported with Feet Together  Able to place feet together independently and stand 1 minute safely    From Standing, Reach Forward with Outstretched Arm  Can reach confidently >25 cm (10")    From Standing Position, Pick up Object from Floor  Able to pick up shoe safely and easily    From Standing Position, Turn to Look Behind Over each Shoulder  Looks behind from both sides and weight shifts well    Turn 360 Degrees  Able to turn 360 degrees safely in 4 seconds or less    Standing Unsupported, Alternately Place Feet on Step/Stool  Able to stand independently and safely and complete 8 steps in 20 seconds    Standing Unsupported, One Foot in Front  Able to plae foot ahead of the other independently and hold 30 seconds    Standing on One Leg  Able to lift leg independently and hold 5-10 seconds    Total Score  54    Berg comment:  54/56      Functional Gait  Assessment   Gait assessed   Yes    Gait Level Surface  Walks 20 ft, slow speed, abnormal gait pattern, evidence for imbalance or deviates 10-15 in outside of the 12 in walkway width. Requires more than 7 sec to ambulate 20 ft.    Change in Gait Speed  Able to change speed, demonstrates mild gait deviations, deviates 6-10 in outside of the 12 in walkway width, or no gait deviations, unable to achieve a major change in velocity, or uses a change in velocity, or uses an assistive device.    Gait with Horizontal Head Turns   Performs head turns smoothly with slight change in gait velocity (eg, minor disruption to smooth gait path), deviates 6-10 in outside 12 in walkway width, or uses an assistive device.    Gait with Vertical Head Turns  Performs head turns with no change in gait. Deviates no more than 6 in outside 12 in walkway width.    Gait and Pivot Turn  Pivot turns safely in greater than 3 sec and stops with no loss of balance, or pivot turns safely within 3 sec and stops with mild imbalance, requires small steps to catch balance.    Step Over Obstacle  Is able to step over one shoe box (4.5 in total height) but must slow down and adjust steps to clear box safely. May require verbal cueing.    Gait with Narrow Base of Support  Ambulates less than 4 steps heel to toe or cannot perform without assistance.    Gait with Eyes Closed  Walks 20 ft, slow speed, abnormal gait pattern, evidence for imbalance, deviates 10-15 in outside 12 in walkway width. Requires more than 9 sec to ambulate 20 ft.    Ambulating Backwards  Walks 20 ft, uses assistive device, slower speed, mild gait deviations, deviates 6-10 in outside 12 in walkway width.    Steps  Two feet to a stair, must use rail. must do step to to descend due to ankle fusion    Total Score  15    FGA comment:  15/30  PT Education - 08/01/17 1237    Education provided  Yes    Education Details  plan for final visits    Person(s) Educated  Patient;Spouse    Methods  Explanation    Comprehension  Verbalized understanding       PT Short Term Goals - 08/01/17 1240      PT SHORT TERM GOAL #1   Title  Pt will participate in further balance assessments: FGA and BERG    Baseline  Met 07/04/2017: Berg 50/56, FGA 12/30. Last assessment 44/56 BERG and 8/30 FGA( scores today may have  been impacted by recent increasein Posterolateral trunk pain.     Time  4    Period  Weeks    Status  Achieved      PT SHORT TERM GOAL #2    Title  Pt will demonstrate increase in gait velocity to >/= 2.0 ft/sec    Baseline  1.8 ft/sec; 2.13 ft/sec    Time  4    Period  Weeks    Status  Achieved      PT SHORT TERM GOAL #3   Title  Pt will demonstrate independence with initial vestibular/balance HEP    Time  4    Period  Weeks    Status  On-going      PT SHORT TERM GOAL #4   Title  Pt will report 50% reduction in sensation of disequilibrium during gait (veering to R) and with quick turns    Baseline  no dizziness or veering with turning to L or R    Time  4    Period  Weeks    Status  Achieved        PT Long Term Goals - 08/01/17 1219      PT LONG TERM GOAL #1   Title  Pt will demonstrate independence with walking program and final vestibular/balance HEP    Time  8    Period  Weeks    Status  On-going    Target Date  09/07/17      PT LONG TERM GOAL #2   Title  Pt will improve gait velocity to >/= 2.4 ft/sec to decrease falls risk    Baseline  2.13    Time  8    Period  Weeks    Status  On-going    Target Date  09/07/17      PT LONG TERM GOAL #3   Title  Pt will improve FGA to >/=19/24 to decr. falls risk.     Baseline  15/30    Time  8    Period  Weeks    Status  On-going    Target Date  09/07/17      PT LONG TERM GOAL #4   Title  Pt will improve BERG by 8 points (to 53/56) to decr. falls risk.    Baseline  54/56     Time  8    Period  Weeks    Status  Achieved      PT LONG TERM GOAL #5   Title  Pt will ambulate x 300' over indoor surfaces with quick turns to L and R with 0/10 disequilibrium    Time  8    Period  Weeks    Status  On-going    Target Date  09/07/17            Plan - 08/01/17 1238    Clinical Impression Statement  Pt returns with continued  back pain but improvement in subjective dizziness and imbalance.  Completed reassessment of objective gait and balance measures to assess current functional level and impairments.  Pt has met 3/4 STG and has met 1 LTG due to improvement  in BERG balance score, will assess HEP goal next session.  Pt is making progress towards LTG but continues to demonstrate impaired dynamic balance, gait, LE strength and use of joint proprioception for balance.  Pt would benefit from remainder of PT visits to continue to address and then pt will likely D/C from neuro PT and pursue consult with orthopedist or spine specialists to assess low back pain.      Rehab Potential  Good    PT Frequency  2x / week    PT Duration  12 weeks    PT Treatment/Interventions  [place] Repostioning;ADLs/Self Care Home Management;Gait training;Functional mobility training;Therapeutic activities;Therapeutic exercise;Balance training;Neuromuscular re-education;Patient/family education;Vestibular    PT Next Visit Plan  Review and update current HEP    Consulted and Agree with Plan of Care  Patient;Family member/caregiver    Family Member Consulted  husband       Patient will benefit from skilled therapeutic intervention in order to improve the following deficits and impairments:  Decreased balance, Decreased strength, Difficulty walking, Dizziness  Visit Diagnosis: Unsteadiness on feet  Muscle weakness (generalized)  Repeated falls     Problem List Patient Active Problem List   Diagnosis Date Noted  . Colon cancer screening 03/24/2017  . Insulin resistance 02/15/2017  . Vitamin D deficiency 02/15/2017  . High serum vitamin B12 02/15/2017  . S/P shoulder replacement, right 05/14/2016  . Acute blood loss anemia 12/04/2014  . Obese 11/13/2014  . S/P bilateral THA, AA 11/12/2014  . Confusion 10/13/2014  . Acute confusional state 10/13/2014  . 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 Aftercare following joint replacement 03/28/2014  . History of artificial joint 03/28/2014  . Degenerative arthritis of hip 09/27/2013  . Paroxysmal atrial fibrillation ([place]) 09/07/2013  . Tachycardia 07/03/2013  . Snoring 07/03/2013  . Diverticulosis of  colon without hemorrhage 06/26/2013  . RUQ abdominal pain 01/04/2013  . Nausea without vomiting 01/04/2013  . Hoarseness of voice 01/04/2013  . Diarrhea 01/04/2013  . Broken toe 03/09/2012  . Dyspnea 11/05/2011  . Upper airway cough syndrome 11/05/2011  . Asthma, persistent controlled 11/05/2011  . Morbid obesity with BMI of 45.0-49.9, adult ([place]) 11/05/2011  . HTN (hypertension) 09/09/2011  . Hyperlipemia 09/09/2011  . Bursitis, trochanteric 08/09/2011  . Ankle arthropathy 05/31/2011    Rico Junker, PT, DPT 08/01/17    12:48 PM    [facility] [address] [place], [place], [address] Phone: [phone]   Fax:  [phone]  Name: [name] MRN: [id number] Date of Birth: [birth year]

## 2017-08-03 ENCOUNTER — Ambulatory Visit: Payer: Medicare Other | Admitting: Physical Therapy

## 2017-08-03 ENCOUNTER — Encounter: Payer: Self-pay | Admitting: Physical Therapy

## 2017-08-03 DIAGNOSIS — R2681 Unsteadiness on feet: Secondary | ICD-10-CM | POA: Diagnosis not present

## 2017-08-03 DIAGNOSIS — M6281 Muscle weakness (generalized): Secondary | ICD-10-CM

## 2017-08-03 DIAGNOSIS — R296 Repeated falls: Secondary | ICD-10-CM

## 2017-08-03 DIAGNOSIS — R42 Dizziness and giddiness: Secondary | ICD-10-CM

## 2017-08-03 NOTE — Therapy (Signed)
[facility] [address] [place], [place], [address] Phone: [phone]   Fax:  [phone]  Physical Therapy Treatment  Patient Details  Name: [name] MRN: [id number] Date of Birth: 11-25-1945 Referring Provider: Vicie Mutters, MD   Encounter Date: 08/03/2017  PT End of Session - 08/03/17 1239    Visit Number  7    Number of Visits  17    Date for PT Re-Evaluation  09/07/17    Authorization Type  Medicare and [employer] - Covered 100%    PT Start Time  1150    PT Stop Time  1232    PT Time Calculation (min)  42 min    Activity Tolerance  Patient tolerated treatment well    Behavior During Therapy  [facility] for tasks assessed/performed       Past Medical History:  Diagnosis Date  . Allergy   . Anemia   . Antral polyp    benign  . Anxiety   . Arthritis   . Asthma   . B12 deficiency   . Back pain   . Chest pain   . Constipation   . Depression   . Diverticulosis   . Gallbladder problem   . GERD (gastroesophageal reflux disease) 09/09/2011  . History of transfusion   . HTN (hypertension) 09/09/2011  . Hyperlipemia 09/09/2011  . Hypertension   . IBS (irritable bowel syndrome)   . Internal hemorrhoid   . Joint pain   . Obesity   . Osteoarthritis   . Paroxysmal atrial fibrillation (HCC)   . PONV (postoperative nausea and vomiting)    with big surgeries, none with endos etc, with colonoscopy- 5-6 years ago could not swallow   . Shortness of breath   . Sleep apnea    borderline per patient no cpap   . SOB (shortness of breath) on exertion   . Swallowing difficulty   . Tachycardia   . Unstable angina ([place]) 09/09/2011  . Vitamin D deficiency   . Vocal cord dysfunction 2013   swelling    Past Surgical History:  Procedure Laterality Date  . ABDOMINAL HYSTERECTOMY  1988  . ANKLE FUSION Right    x 2  . BACK SURGERY  [phone]   3 ruptured discs, lower back  . BILATERAL ANTERIOR TOTAL HIP ARTHROPLASTY  Bilateral 11/12/2014   Procedure: BILATERAL ANTERIOR TOTAL HIP ARTHROPLASTY;  Surgeon: Paralee Cancel, MD;  Location: [facility];  Service: Orthopedics;  Laterality: Bilateral;  . BRAVO [facility] STUDY [name] 03/06/2013   Procedure: BRAVO [place] STUDY;  Surgeon: Inda Castle, MD;  Location: [facility];  Service: Endoscopy;  Laterality: [name];  . CARDIAC CATHETERIZATION  2013   NORMAL  . CHOLECYSTECTOMY    . COLONOSCOPY    . ELBOW SURGERY Left    removed bone chip  . ESOPHAGOGASTRODUODENOSCOPY [name] 03/06/2013   Procedure: ESOPHAGOGASTRODUODENOSCOPY (EGD);  Surgeon: Inda Castle, MD;  Location: [facility];  Service: Endoscopy;  Laterality: [name];  . FOOT OSTEOTOMY W/ PLANTAR FASCIA RELEASE Left   . ILIOTIBIAL BAND RELEASE Right 1997  . JOINT REPLACEMENT    . LAPAROSCOPIC GASTRIC SLEEVE RESECTION WITH HIATAL HERNIA REPAIR  08/20/2014   Procedure: LAPAROSCOPIC GASTRIC SLEEVE RESECTION WITH HIATAL HERNIA  REPAIR AND  UPPER ENDOSCOPY;  Surgeon: Johnathan Hausen, MD;  Location: [facility];  Service: General;;  . LEFT HEART CATHETERIZATION WITH CORONARY ANGIOGRAM Bilateral 09/10/2011   Procedure: LEFT HEART CATHETERIZATION WITH CORONARY ANGIOGRAM;  Surgeon: Lorretta Harp, MD;  Location: [facility];  Service: Cardiovascular;  Laterality: Bilateral;  . NASAL SINUS SURGERY  1997  . REVERSE SHOULDER ARTHROPLASTY Right 05/14/2016   Procedure: RIGHT REVERSE SHOULDER ARTHROPLASTY;  Surgeon: Netta Cedars, MD;  Location: [facility];  Service: Orthopedics;  Laterality: Right;  . SHOULDER SURGERY Left   . tfc wrist Left 1997  . thumb replacement Bilateral    one 2009 and one 2014  . TOTAL ANKLE REPLACEMENT Right    x 3  . TOTAL ANKLE REPLACEMENT Left   . TOTAL ANKLE REPLACEMENT    . TOTAL HIP ARTHROPLASTY Bilateral   . TOTAL KNEE ARTHROPLASTY Bilateral    left 2002, right 2012  . TOTAL SHOULDER ARTHROPLASTY Right 05/14/2016  . TRANSTHORACIC ECHOCARDIOGRAM  09/09/2011   MILD CONCENTRIC LVH. TRACE MITRAL REGURG. TRACE TR. MILD  AORTIC REGURG.  . 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 UPPER GI ENDOSCOPY  08/20/2014   Procedure: UPPER GI ENDOSCOPY;  Surgeon: Johnathan Hausen, MD;  Location: [facility];  Service: General;;  . WRIST SURGERY Left 1997, 2009     There were no vitals filed for this visit.  Subjective Assessment - 08/03/17 1153    Subjective  Back is still "there".  Is still taking mm relaxers and is still planning on getting a consult to a spine specialist.     Pertinent History  anxiety, OA, back pain, chest pain, depression, HTN, hyperlipidemia, IBS, Afib, sleep apnea, multiple surgeries including R ankle fusion, shoulder replacement, bilat hip replacements, back surgery, bilat knee replacements, and wrist surgery, obesity, bilat sensorineural hearing loss, tinnitus L ear, L ocular aneurysm with diplopia and multiple childhood ear infections    Limitations  Walking    Patient Stated Goals  To prevent future falls, improved balance    Currently in Pain?  Yes    Pain Onset  More than a month ago       Single Leg - Eyes Open    Holding support, lift right leg while maintaining balance over other leg.Squeeze bottom muscles together to engage glute medius muscle. Progress to removing hands from support surface for longer periods of time. Hold__10__ seconds.Repeat with other leg. Repeat __3__ times per session. Do __1__ sessions per day.  Copyright  VHI. All rights reserved.  Feet Apart (Compliant Surface) Varied Arm Positions - Eyes Closed    Stand on compliant surface: _pillow  with feet shoulder width apart and arms at your side.  Close eyes and visualize upright position. Hold__30__ seconds, keep one finger touching the back of the chair.   Feet Together (Compliant Surface) Head Motion - Eyes Open    With eyes open, standing on compliant surface: Pillow, feet together, move head slowly: up and down 10 times, side to side 10 times.  Have chair in front of you for support if  needed.   Feet Heel-Toe "Tandem"    Walk next to the counter top, one hand on the counter top, walk a straight line bringing one foot directly in front of the other. 4 laps down and back   Gaze Stabilization: Tip Card  1.Target must remain in focus, not blurry, and appear stationary while head is in motion. 2.Perform exercises with small head movements (45 to either side of midline). 3.Increase speed of head motion so long as target is in focus. 4.If you wear eyeglasses, be sure you can see target through lens (therapist will give specific instructions for bifocal / progressive lenses). 5.These exercises may  provoke dizziness or nausea. Work through these symptoms. If too dizzy, slow head movement slightly. Rest between each exercise. 6.Exercises demand concentration; avoid distractions.  Copyright  VHI. All rights reserved.  Gaze Stabilization - Standing Feet Apart   Feet shoulder width apart, keeping eyes on target on wall 3 feet away, tilt head down slightly and move head side to side for 30 seconds. Repeat while moving head up and down for 30 seconds. *Work up to tolerating 60 seconds, as able. Perform 3 sets as below:  1) standing on solid floor, feet apart. 2) standing on floor, feet together 3) standing on pillow, feet apart   WALKING  Walking is a great form of exercise to increase your strength, endurance and overall fitness.  A walking program can help you start slowly and gradually build endurance as you go.  Everyone's ability is different, so each person's starting point will be different.  You do not have to follow them exactly.  The are just samples. You should simply find out what's right for you and stick to that program.    A. You Can Walk For A Certain Number of Laps around the [facility] with 3-4 laps (1/2 mile) for first 5 walking days and then increase by one lap every 5 walking days.   Please only do the exercises that your therapist  has initialed and dated     PT Education - 08/03/17 1235    Education provided  Yes    Education Details  final HEP, D/C today    Person(s) Educated  Patient;Spouse    Methods  Explanation;Demonstration;Handout    Comprehension  Verbalized understanding       PT Short Term Goals - 08/01/17 1240      PT SHORT TERM GOAL #1   Title  Pt will participate in further balance assessments: FGA and BERG    Baseline  Met 07/04/2017: Berg 50/56, FGA 12/30. Last assessment 44/56 BERG and 8/30 FGA( scores today may have  been impacted by recent increasein Posterolateral trunk pain.     Time  4    Period  Weeks    Status  Achieved      PT SHORT TERM GOAL #2   Title  Pt will demonstrate increase in gait velocity to >/= 2.0 ft/sec    Baseline  1.8 ft/sec; 2.13 ft/sec    Time  4    Period  Weeks    Status  Achieved      PT SHORT TERM GOAL #3   Title  Pt will demonstrate independence with initial vestibular/balance HEP    Time  4    Period  Weeks    Status  On-going      PT SHORT TERM GOAL #4   Title  Pt will report 50% reduction in sensation of disequilibrium during gait (veering to R) and with quick turns    Baseline  no dizziness or veering with turning to L or R    Time  4    Period  Weeks    Status  Achieved        PT Long Term Goals - 08/03/17 1245      PT LONG TERM GOAL #1   Title  Pt will demonstrate independence with walking program and final vestibular/balance HEP    Time  8    Period  Weeks    Status  Achieved      PT LONG TERM GOAL #2  Title  Pt will improve gait velocity to >/= 2.4 ft/sec to decrease falls risk    Baseline  2.13    Time  8    Period  Weeks    Status  Not Met      PT LONG TERM GOAL #3   Title  Pt will improve FGA to >/=19/24 to decr. falls risk.     Baseline  15/30    Time  8    Period  Weeks    Status  Not Met      PT LONG TERM GOAL #4   Title  Pt will improve BERG by 8 points (to 53/56) to decr. falls risk.    Baseline  54/56     Time   8    Period  Weeks    Status  Achieved      PT LONG TERM GOAL #5   Title  Pt will ambulate x 300' over indoor surfaces with quick turns to L and R with 0/10 disequilibrium    Time  8    Period  Weeks    Status  Achieved            Plan - 08/03/17 1240    Clinical Impression Statement  Pt feels that because her balance has improved and she is no longer having episodes of vertigo she is ready for D/C from therapy with an exercise program.  Treatment session today focused on review and progression of gaze adaptation, corner balance exercises, hip strengthening and walking program.  Pt did not reach all LTG but was making steady progress towards LTG and is safe for D/C today with full HEP.  Pt is pleased with progress and agreeable to D/C.    Rehab Potential  Good    PT Frequency  2x / week    PT Duration  12 weeks    PT Treatment/Interventions  [place] Repostioning;ADLs/Self Care Home Management;Gait training;Functional mobility training;Therapeutic activities;Therapeutic exercise;Balance training;Neuromuscular re-education;Patient/family education;Vestibular    PT Next Visit Plan  D/C    Consulted and Agree with Plan of Care  Patient;Family member/caregiver    Family Member Consulted  husband       Patient will benefit from skilled therapeutic intervention in order to improve the following deficits and impairments:  Decreased balance, Decreased strength, Difficulty walking, Dizziness  Visit Diagnosis: Unsteadiness on feet  Muscle weakness (generalized)  Repeated falls  Dizziness and giddiness     Problem List Patient Active Problem List   Diagnosis Date Noted  . Colon cancer screening 03/24/2017  . Insulin resistance 02/15/2017  . Vitamin D deficiency 02/15/2017  . High serum vitamin B12 02/15/2017  . S/P shoulder replacement, right 05/14/2016  . Acute blood loss anemia 12/04/2014  . Obese 11/13/2014  . S/P bilateral THA, AA 11/12/2014  . Confusion 10/13/2014  .  Acute confusional state 10/13/2014  . 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 Aftercare following joint replacement 03/28/2014  . History of artificial joint 03/28/2014  . Degenerative arthritis of hip 09/27/2013  . Paroxysmal atrial fibrillation ([place]) 09/07/2013  . Tachycardia 07/03/2013  . Snoring 07/03/2013  . Diverticulosis of colon without hemorrhage 06/26/2013  . RUQ abdominal pain 01/04/2013  . Nausea without vomiting 01/04/2013  . Hoarseness of voice 01/04/2013  . Diarrhea 01/04/2013  . Broken toe 03/09/2012  . Dyspnea 11/05/2011  . Upper airway cough syndrome 11/05/2011  . Asthma, persistent controlled 11/05/2011  . Morbid obesity with BMI of 45.0-49.9, adult ([place]) 11/05/2011  .  HTN (hypertension) 09/09/2011  . Hyperlipemia 09/09/2011  . Bursitis, trochanteric 08/09/2011  . Ankle arthropathy 05/31/2011   PHYSICAL THERAPY DISCHARGE SUMMARY  Visits from Start of Care: 7  Current functional level related to goals / functional outcomes: See LTG achievement and impression statement above   Remaining deficits: Disequilibrium, impaired balance, LE weakness   Education / Equipment: HEP  Plan: Patient agrees to discharge.  Patient goals were partially met. Patient is being discharged due to being pleased with the current functional level.  ?????     Rico Junker, PT, DPT 08/03/17    12:47 PM  [facility] [address] [place], [place], [address] Phone: [phone]   Fax:  [phone]  Name: LYNNLEE REVELS MRN: [id number] Date of Birth: [birth year]

## 2017-08-03 NOTE — Patient Instructions (Addendum)
Single Leg - Eyes Open    Holding support, lift right leg while maintaining balance over other leg.Squeeze bottom muscles together to engage glute medius muscle. Progress to removing hands from support surface for longer periods of time. Hold__10__ seconds.Repeat with other leg. Repeat __3__ times per session. Do __1__ sessions per day.  Copyright  VHI. All rights reserved.  Feet Apart (Compliant Surface) Varied Arm Positions - Eyes Closed    Stand on compliant surface: _pillow  with feet shoulder width apart and arms at your side.  Close eyes and visualize upright position. Hold__30__ seconds, keep one finger touching the back of the chair.   Feet Together (Compliant Surface) Head Motion - Eyes Open    With eyes open, standing on compliant surface: Pillow, feet together, move head slowly: up and down 10 times, side to side 10 times.  Have chair in front of you for support if needed.   Feet Heel-Toe "Tandem"    Walk next to the counter top, one hand on the counter top, walk a straight line bringing one foot directly in front of the other. 4 laps down and back   Gaze Stabilization: Tip Card  1.Target must remain in focus, not blurry, and appear stationary while head is in motion. 2.Perform exercises with small head movements (45 to either side of midline). 3.Increase speed of head motion so long as target is in focus. 4.If you wear eyeglasses, be sure you can see target through lens (therapist will give specific instructions for bifocal / progressive lenses). 5.These exercises may provoke dizziness or nausea. Work through these symptoms. If too dizzy, slow head movement slightly. Rest between each exercise. 6.Exercises demand concentration; avoid distractions.  Copyright  VHI. All rights reserved.  Gaze Stabilization - Standing Feet Apart   Feet shoulder width apart, keeping eyes on target on wall 3 feet away, tilt head down slightly and move head side to side  for 30 seconds. Repeat while moving head up and down for 30 seconds. *Work up to tolerating 60 seconds, as able. Perform 3 sets as below:  1) standing on solid floor, feet apart. 2) standing on floor, feet together 3) standing on pillow, feet apart   WALKING  Walking is a great form of exercise to increase your strength, endurance and overall fitness.  A walking program can help you start slowly and gradually build endurance as you go.  Everyone's ability is different, so each person's starting point will be different.  You do not have to follow them exactly.  The are just samples. You should simply find out what's right for you and stick to that program.    A. You Can Walk For A Certain Number of Laps around the [facility] with 3-4 laps (1/2 mile) for first 5 walking days and then increase by one lap every 5 walking days.   Please only do the exercises that your therapist has initialed and dated

## 2017-08-05 DIAGNOSIS — H532 Diplopia: Secondary | ICD-10-CM | POA: Diagnosis not present

## 2017-08-10 ENCOUNTER — Encounter: Payer: Self-pay | Admitting: Physical Therapy

## 2017-08-17 ENCOUNTER — Encounter: Payer: Self-pay | Admitting: Physical Therapy

## 2017-08-19 ENCOUNTER — Encounter: Payer: Self-pay | Admitting: Physical Therapy

## 2017-08-22 DIAGNOSIS — H4912 Fourth [trochlear] nerve palsy, left eye: Secondary | ICD-10-CM | POA: Diagnosis not present

## 2017-08-25 ENCOUNTER — Ambulatory Visit (INDEPENDENT_AMBULATORY_CARE_PROVIDER_SITE_OTHER): Payer: Medicare Other | Admitting: Family Medicine

## 2017-08-25 VITALS — BP 98/64 | HR 87 | Ht 63.0 in | Wt 208.0 lb

## 2017-08-25 DIAGNOSIS — F3289 Other specified depressive episodes: Secondary | ICD-10-CM

## 2017-08-25 DIAGNOSIS — Z6837 Body mass index (BMI) 37.0-37.9, adult: Secondary | ICD-10-CM | POA: Diagnosis not present

## 2017-08-25 NOTE — Progress Notes (Signed)
Office: [phone]  /  Fax: [phone]   HPI:   Chief Complaint: OBESITY [name] is here to discuss her progress with her obesity treatment plan. She is on the keep a food journal with 1300 calories and 75+ grams of protein daily and is following her eating plan approximately 0 % of the time. She states she is exercising 0 minutes 0 times per week. [name] last visit was approximately 3 months ago and she has been off track, starting with back pain and possible bladder problems. She thinks she is ready to get back on track. Her weight is 208 lb (94.3 kg) today and has had a weight gain of 8 pounds over a period of 11 weeks since her last visit. She has gained 15 lbs since starting treatment with [place].  Status Post Gastric Sleeve  Depression with emotional eating behaviors [name] is off Wellbutrin and is frustrated that she is struggling with motivation and emotional eating. [name] struggles with emotional eating and using food for comfort to the extent that it is negatively impacting her health. She often snacks when she is not hungry. [name] sometimes feels she is out of control and then feels guilty that she made poor food choices. She has been working on behavior modification techniques to help reduce her emotional eating and has been somewhat successful. She shows no sign of suicidal or homicidal ideations. [name] is ready to get back on track.  Depression screen [facility] 2/9 10/08/2016 02/04/2016 11/05/2015 08/06/2015 05/09/2015  Decreased Interest 1 0 0 0 0  Down, Depressed, Hopeless 1 0 0 0 0  PHQ - 2 Score 2 0 0 0 0  Altered sleeping 0 - - - -  Tired, decreased energy 0 - - - -  Change in appetite 0 - - - -  Feeling bad or failure about yourself  0 - - - -  Trouble concentrating 0 - - - -  Moving slowly or fidgety/restless 0 - - - -  Suicidal thoughts 0 - - - -  PHQ-9 Score 2 - - - -     ALLERGIES: Allergies  Allergen Reactions  . Ativan [Lorazepam] Other (See Comments)   Agitation, aggressive actions, significant mental changes  . Buprenorphine Hcl Itching and Rash  . Fentanyl Itching  . Morphine And Related Itching and Rash    MEDICATIONS: Current Outpatient Medications on File Prior to Visit  Medication Sig Dispense Refill  . acetaminophen (TYLENOL) 500 MG tablet Take 1,000 mg by mouth every 6 (six) hours as needed (for pain.).     [name] albuterol (PROVENTIL HFA;VENTOLIN HFA) 108 (90 Base) MCG/ACT inhaler Inhale 1-2 puffs into the lungs every 6 (six) hours as needed for shortness of breath or wheezing.    . Biotin 10000 MCG TABS Take 10,000 mcg by mouth at bedtime.     [name] buPROPion (WELLBUTRIN SR) 200 MG 12 hr tablet Take 1 tablet (200 mg total) by mouth 2 (two) times daily. (Patient taking differently: Take 200 mg by mouth. ) 30 tablet 0  . cetirizine (ZYRTEC) 10 MG tablet Take 10 mg by mouth daily.     . cholecalciferol (VITAMIN D) 1000 units tablet Take 1 tablet (1,000 Units total) by mouth 2 (two) times daily. 60 tablet 0  . clorazepate (TRANXENE) 7.5 MG tablet Take 7.5 mg by mouth 3 (three) times daily as needed for anxiety.    [name] diltiazem (CARTIA XT) 180 MG 24 hr capsule Take 1 capsule (180 mg total) by mouth daily.  30 capsule 11  . DULoxetine (CYMBALTA) 60 MG capsule Take 60 mg by mouth daily with supper.     . Methylcellulose, Laxative, 500 MG TABS Take 1 tablet by mouth daily.     . Multiple Vitamin (MULTIVITAMIN WITH MINERALS) TABS tablet Take 1 tablet by mouth 2 (two) times daily. Centrum    . pantoprazole (PROTONIX) 40 MG tablet Take 1 tablet (40 mg total) by mouth every morning. 90 tablet 3  . Probiotic Product (RESTORA) CAPS Take 1 capsule by mouth daily. (Patient taking differently: Take 1 capsule by mouth daily with supper. ) 30 capsule 2  . simvastatin (ZOCOR) 20 MG tablet Take 1 tablet (20 mg total) by mouth daily. 30 tablet 11  . sucralfate (CARAFATE) 1 g tablet Take 1 tablet (1 g total) by mouth 4 (four) times daily -  with meals and at  bedtime. May dissolve tablet in 1 tablespoon of water to take as a slurry 120 tablet 0  . traMADol (ULTRAM) 50 MG tablet Take 50 mg by mouth every 6 (six) hours as needed (for pain.).     [name] vitamin B-12 (CYANOCOBALAMIN) 1000 MCG tablet Take 1,000 mcg by mouth daily.     Current Facility-Administered Medications on File Prior to Visit  Medication Dose Route Frequency Provider Last Rate Last Dose  . 0.9 %  sodium chloride infusion  500 mL Intravenous Continuous Armbruster, Carlota Raspberry, MD      . 0.9 %  sodium chloride infusion  500 mL Intravenous Continuous Armbruster, Carlota Raspberry, MD        PAST MEDICAL HISTORY: Past Medical History:  Diagnosis Date  . Allergy   . Anemia   . Antral polyp    benign  . Anxiety   . Arthritis   . Asthma   . B12 deficiency   . Back pain   . Chest pain   . Constipation   . Depression   . Diverticulosis   . Gallbladder problem   . GERD (gastroesophageal reflux disease) 09/09/2011  . History of transfusion   . HTN (hypertension) 09/09/2011  . Hyperlipemia 09/09/2011  . Hypertension   . IBS (irritable bowel syndrome)   . Internal hemorrhoid   . Joint pain   . Obesity   . Osteoarthritis   . Paroxysmal atrial fibrillation (HCC)   . PONV (postoperative nausea and vomiting)    with big surgeries, none with endos etc, with colonoscopy- 5-6 years ago could not swallow   . Shortness of breath   . Sleep apnea    borderline per patient no cpap   . SOB (shortness of breath) on exertion   . Swallowing difficulty   . Tachycardia   . Unstable angina ([place]) 09/09/2011  . Vitamin D deficiency   . Vocal cord dysfunction 2013   swelling    PAST SURGICAL HISTORY: Past Surgical History:  Procedure Laterality Date  . ABDOMINAL HYSTERECTOMY  1988  . ANKLE FUSION Right    x 2  . BACK SURGERY  [phone]   3 ruptured discs, lower back  . BILATERAL ANTERIOR TOTAL HIP ARTHROPLASTY Bilateral 11/12/2014   Procedure: BILATERAL ANTERIOR TOTAL HIP ARTHROPLASTY;   Surgeon: Paralee Cancel, MD;  Location: [facility];  Service: Orthopedics;  Laterality: Bilateral;  . BRAVO [place] STUDY [name] 03/06/2013   Procedure: BRAVO [place] STUDY;  Surgeon: Inda Castle, MD;  Location: [facility];  Service: Endoscopy;  Laterality: [name];  . CARDIAC CATHETERIZATION  2013   NORMAL  . CHOLECYSTECTOMY    .  COLONOSCOPY    . ELBOW SURGERY Left    removed bone chip  . ESOPHAGOGASTRODUODENOSCOPY [name] 03/06/2013   Procedure: ESOPHAGOGASTRODUODENOSCOPY (EGD);  Surgeon: Inda Castle, MD;  Location: [facility];  Service: Endoscopy;  Laterality: [name];  . FOOT OSTEOTOMY W/ PLANTAR FASCIA RELEASE Left   . ILIOTIBIAL BAND RELEASE Right 1997  . JOINT REPLACEMENT    . LAPAROSCOPIC GASTRIC SLEEVE RESECTION WITH HIATAL HERNIA REPAIR  08/20/2014   Procedure: LAPAROSCOPIC GASTRIC SLEEVE RESECTION WITH HIATAL HERNIA  REPAIR AND  UPPER ENDOSCOPY;  Surgeon: Johnathan Hausen, MD;  Location: [facility];  Service: General;;  . LEFT HEART CATHETERIZATION WITH CORONARY ANGIOGRAM Bilateral 09/10/2011   Procedure: LEFT HEART CATHETERIZATION WITH CORONARY ANGIOGRAM;  Surgeon: Lorretta Harp, MD;  Location: [facility] CATH LAB;  Service: Cardiovascular;  Laterality: Bilateral;  . NASAL SINUS SURGERY  1997  . REVERSE SHOULDER ARTHROPLASTY Right 05/14/2016   Procedure: RIGHT REVERSE SHOULDER ARTHROPLASTY;  Surgeon: Netta Cedars, MD;  Location: [place];  Service: Orthopedics;  Laterality: Right;  . SHOULDER SURGERY Left   . tfc wrist Left 1997  . thumb replacement Bilateral    one 2009 and one 2014  . TOTAL ANKLE REPLACEMENT Right    x 3  . TOTAL ANKLE REPLACEMENT Left   . TOTAL ANKLE REPLACEMENT    . TOTAL HIP ARTHROPLASTY Bilateral   . TOTAL KNEE ARTHROPLASTY Bilateral    left 2002, right 2012  . TOTAL SHOULDER ARTHROPLASTY Right 05/14/2016  . TRANSTHORACIC ECHOCARDIOGRAM  09/09/2011   MILD CONCENTRIC LVH. TRACE MITRAL REGURG. TRACE TR. MILD AORTIC REGURG.  . 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 UPPER GI  ENDOSCOPY  08/20/2014   Procedure: UPPER GI ENDOSCOPY;  Surgeon: Johnathan Hausen, MD;  Location: [facility];  Service: General;;  . WRIST SURGERY Left 1997, 2009     SOCIAL HISTORY: Social History   Tobacco Use  . Smoking status: Never Smoker  . Smokeless tobacco: Never Used  Substance Use Topics  . Alcohol use: No  . Drug use: No    FAMILY HISTORY: Family History  Problem Relation Age of Onset  . Hypertension Mother   . Diabetes Mother   . Arthritis Mother   . Esophageal cancer Mother   . Cancer Mother   . Esophageal cancer Father 59  . Cancer Father   . Alcoholism Father   . Other Sister        kidney mass removed  . Hypertension Sister   . Heart disease Maternal Grandmother   . Stroke Paternal Grandmother   . Coronary artery disease Neg Hx   . Colon cancer Neg Hx   . Liver disease Neg Hx   . Kidney disease Neg Hx   . Rectal cancer Neg Hx   . Stomach cancer Neg Hx     ROS: Review of Systems  Constitutional: Negative for weight loss.  Psychiatric/Behavioral: Positive for depression. Negative for suicidal ideas.    PHYSICAL EXAM: Blood pressure 98/64, pulse 87, height 5\' 3"  (1.6 m), weight 208 lb (94.3 kg), SpO2 96 %. Body mass index is 36.85 kg/m. Physical Exam  Constitutional: She is oriented to person, place, and time. She appears well-developed and well-nourished.  Cardiovascular: Normal rate.  Pulmonary/Chest: Effort normal.  Musculoskeletal: Normal range of motion.  Neurological: She is oriented to person, place, and time.  Skin: Skin is warm and dry.  Psychiatric: She has a normal mood and affect. Her behavior is normal.  Vitals reviewed.  RECENT LABS AND TESTS: BMET    Component Value Date/Time   NA 143 03/04/2017 1109   NA 146 (H) 02/15/2017 1207   K 3.8 03/04/2017 1109   CL 108 03/04/2017 1109   CO2 30 03/04/2017 1109   GLUCOSE 76 03/04/2017 1109   BUN 18 03/04/2017 1109   BUN 15 02/15/2017 1207   CREATININE 0.99 03/04/2017 1109   CALCIUM  8.8 03/04/2017 1109   GFRNONAA 77 02/15/2017 1207   GFRAA 89 02/15/2017 1207   Lab Results  Component Value Date   HGBA1C 5.4 10/08/2016   HGBA1C 5.8 (H) 10/13/2014   Lab Results  Component Value Date   INSULIN 8.2 02/15/2017   INSULIN 9.8 10/08/2016   CBC    Component Value Date/Time   WBC 4.2 03/04/2017 1109   RBC 4.09 03/04/2017 1109   HGB 12.7 03/04/2017 1109   HGB 13.0 02/15/2017 1207   HCT 39.0 03/04/2017 1109   HCT 40.0 02/15/2017 1207   PLT 183.0 03/04/2017 1109   MCV 95.3 03/04/2017 1109   MCV 94 02/15/2017 1207   MCH 30.4 02/15/2017 1207   MCH 31.7 05/10/2016 1304   MCHC 32.7 03/04/2017 1109   RDW 13.1 03/04/2017 1109   RDW 13.3 02/15/2017 1207   LYMPHSABS 1.4 03/04/2017 1109   LYMPHSABS 1.4 02/15/2017 1207   MONOABS 0.3 03/04/2017 1109   EOSABS 0.1 03/04/2017 1109   EOSABS 0.1 02/15/2017 1207   BASOSABS 0.0 03/04/2017 1109   BASOSABS 0.0 02/15/2017 1207   Iron/TIBC/Ferritin/ %Sat No results found for: IRON, TIBC, FERRITIN, IRONPCTSAT Lipid Panel     Component Value Date/Time   CHOL 204 (H) 02/15/2017 1207   TRIG 115 02/15/2017 1207   HDL 84 02/15/2017 1207   LDLCALC 97 02/15/2017 1207   Hepatic Function Panel     Component Value Date/Time   PROT 6.0 03/04/2017 1109   PROT 5.8 (L) 02/15/2017 1207   ALBUMIN 3.8 03/04/2017 1109   ALBUMIN 4.1 02/15/2017 1207   AST 22 03/04/2017 1109   ALT 14 03/04/2017 1109   ALKPHOS 78 03/04/2017 1109   BILITOT 0.5 03/04/2017 1109   BILITOT 0.3 02/15/2017 1207      Component Value Date/Time   TSH 1.60 09/30/2016   TSH 0.771 10/13/2014 1700   TSH 1.37 08/03/2013 1421   Results for CABELA, PACIFICO (MRN [id number]) as of 08/25/2017 11:28  Ref. Range 02/15/2017 12:07  Vitamin D, 25-Hydroxy Latest Ref Range: 30.0 - 100.0 ng/mL 54.1   ASSESSMENT AND PLAN: Other depression - with emotional eating  Class 2 severe obesity with serious comorbidity and body mass index (BMI) of 37.0 to 37.9 in adult, unspecified  obesity type ([place])  PLAN:  Depression with Emotional Eating Behaviors We discussed behavior modification techniques today to help [name] deal with her emotional eating and depression. She has agreed restart Wellbutrin SR 200 mg qd #30 with no refills and follow up as directed. We will refer to Dr. Mallie Mussel for evaluation.  Obesity [name] is currently in the action stage of change. As such, her goal is to continue with weight loss efforts She has agreed to keep a food journal with 1300 calories and 75+ grams of protein daily [name] has been instructed to work up to a goal of 150 minutes of combined cardio and strengthening exercise per week for weight loss and overall health benefits. We discussed the following Behavioral Modification Strategies today: increasing lean protein intake, decreasing simple carbohydrates  and work on meal planning and easy  cooking plans  [name] has agreed to follow up with our clinic in 2 to 3 weeks and with Dr. Mallie Mussel in 1 week. She was informed of the importance of frequent follow up visits to maximize her success with intensive lifestyle modifications for her multiple health conditions.   OBESITY BEHAVIORAL INTERVENTION VISIT  Today's visit was # 11 out of 22.  Starting weight: 193 lbs Starting date: 10/08/16 Today's weight : 208 lbs  Today's date: 08/25/2017 Total lbs lost to date: 0 (Patients must lose 7 lbs in the first 6 months to continue with counseling)   ASK: We discussed the diagnosis of obesity with [name] today and Avea agreed to give [place] permission to discuss obesity behavioral modification therapy today.  ASSESS: Annetta has the diagnosis of obesity and her BMI today is 36.85 [name] is in the action stage of change   ADVISE: [name] was educated on the multiple health risks of obesity as well as the benefit of weight loss to improve her health. She was advised of the need for long term treatment and the importance of  lifestyle modifications.  AGREE: Multiple dietary modification options and treatment options were discussed and  [name] agreed to the above obesity treatment plan.  I, Doreene Nest, am acting as transcriptionist for Dennard Nip, MD  I have reviewed the above documentation for accuracy and completeness, and I agree with the above. -Dennard Nip, MD

## 2017-08-29 ENCOUNTER — Other Ambulatory Visit (INDEPENDENT_AMBULATORY_CARE_PROVIDER_SITE_OTHER): Payer: Self-pay | Admitting: Family Medicine

## 2017-08-29 ENCOUNTER — Telehealth (INDEPENDENT_AMBULATORY_CARE_PROVIDER_SITE_OTHER): Payer: Self-pay | Admitting: Family Medicine

## 2017-08-29 DIAGNOSIS — F3289 Other specified depressive episodes: Secondary | ICD-10-CM

## 2017-08-29 NOTE — Telephone Encounter (Signed)
Prescription sent to the patient pharmacy. [name], [place]

## 2017-08-29 NOTE — Telephone Encounter (Signed)
welbutrin refill request, [facility] on [address]

## 2017-08-30 ENCOUNTER — Other Ambulatory Visit: Payer: Self-pay | Admitting: Cardiovascular Disease

## 2017-09-01 ENCOUNTER — Ambulatory Visit (INDEPENDENT_AMBULATORY_CARE_PROVIDER_SITE_OTHER): Payer: Medicare Other | Admitting: Psychology

## 2017-09-01 DIAGNOSIS — F3289 Other specified depressive episodes: Secondary | ICD-10-CM | POA: Diagnosis not present

## 2017-09-01 NOTE — Progress Notes (Signed)
Office: [phone]  /  Fax: [phone] Date: September 01, 2017 Time Seen: 1:55pm Duration: 80 minutes Provider: Glennie Isle, PsyD Type of Session: Intake for Individual Therapy  Informed Consent: The provider's role was explained to [name]. The provider discussed issues of confidentiality, privacy, and limits therein; anticipated course of treatment; potential risks involved with psychotherapy; voluntary nature of treatment; and the clinic's cancellation policy. In addition to written consent, verbal informed consent for psychological services was also obtained from [place] prior to initial intake interview.   Carmellia was informed that information about mental health appointments will be entered in the medical record at [facility] [employer]) via Epic. Shawna agreed information may be shared with other [facility] providers as needed for coordination of care. The provider also explained leaving voicemail messages and MyChart can be utilized for non-emergency reasons and limits of confidentiality related to communication via technology was discussed. [place] was also informed the clinic is not a 24/7 crisis center and mental health emergency resources were shared. She was provided with a handout with emergency resources. Tayler verbally acknowledged understanding and also agreed to use mental health emergency resources discussed if needed.   Chief Complaint:  Barabara was referred by Dr. Acquanetta Belling due to Depression with Emotional Eating Behaviors. More specifically, at her last visit with Dr. Shary Decamp, Rip Harbour expressed frustration related to struggling with motivation and emotional eating. She also reported during that appointment she uses food for comfort, snacks when not hungry, sometimes feels out of control, and feels guilty related to making poor food choices. During today's appointment, Birda reported she engages in "time-based eating" and  continues to gain weight as she increased her carbohydrates intake due to cravings. After indulging she feels "fine," but noted feeling mad prior to indulging (I.e., "Why can't I eat it?") She shared various stressors, including worry related to her own health, her husband's health, and her friend's husband's health. Ambrie also noted worry about her older sister and noted loss of loved ones. Per Rip Harbour, her current stressors have contributed to overeating as "food is comfort." She also indicated she feels tired and described engaging in all or nothing thinking.    HPI:  Approximately three years ago, Mariadelaluz indicated she had a gastric sleeve and 30 days after that she had hip replacements. After her surgery, she reported she "couldn't tolerate sweets." She indicated she began overeating last summer and that was when she also "added the sweets." Regarding feeling tired, Jayci shared it began last month and she is unsure if it is related to her medical conditions.   Mental Status Examination:  Martie arrived early for the appointment. Thus, the appointment was initiated early.  She presented as appropriately dressed and groomed. Shayann appeared her stated age and demonstrated adequate orientation to time, place, person, and purpose of the appointment. She also demonstrated appropriate eye contact. No psychomotor abnormalities or behavioral peculiarities noted. Her mood was euthymic with congruent affect. Her thought processes were logical, linear, and goal-directed. No hallucinations, delusions, bizarre thinking or behavior reported or observed. Judgment, insight, and impulse control appeared to be grossly intact. There was no evidence of paraphasias (i.e., errors in speech, gross mispronunciations, and word substitutions), repetition deficits, or disturbances in volume or prosody (i.e., rhythm and intonation). There was no evidence of attention or memory impairments. Rima denied current suicidal and  homicidal ideation, intent or   The Montreal Cognitive Assessment (MoCA) was administered. The MoCA assesses different cognitive domains:  attention and concentration, executive functions, memory, language, visuoconstructional skills, conceptual thinking, calculations, and orientation. Brenlee received 26 out of 30 points possible on the MoCA, which is noted in the normal range. Four points were lost in the delayed recall task as she was able to recall one of five words without cues after a time delay. With a category cue, she was able to recall another word. With multiple choice cues, Shanina was able to recall the remaining three words.   Family & Psychosocial History:  Kyren was born and raised in [place], [place]. Her parents are deceased and her sister resides in [place] shared she has been married for 32 years and has two children, four grandchildren, and one great-granddaughter. 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 Adilen reported her highest level of education is a Patent attorney degree in early childhood development. Her current social support consists of her friend and husband. Theola indicated her children live nearby and they planned a family vacation for September. Currently, Brendalee resides with her husband. She shared they attend church occasionally and they are in the process of finding a new church.   Medical History:  Per Rip Harbour, her medical history is significant for GI issues, double vision, cranial nerve palsy, vertigo, and a history of atrial fibrillation. She indicated she has had the following surgeries: gastric sleeve, hysterectomy; gallbladder removal; tumor removal in her right leg at the age of 52; and ankle, hip, and knee surgeries before replacements. Rai indicated her right ankle, both knees and hips, right shoulder, and both thumbs are artificial. She was unable to recall all her  current medications, but reported she takes Wellbutrin, Cymbalta, a multivitamin, Cardia, Symvastatin, and Trazodone as needed. She denied a history of head injuries and loss of consciousness. Moreover, Madalena indicated she was in a wheelchair for eight months prior to her gastric sleeve surgery. At that time, she was reportedly prescribed various pain medications resulting in hospitalization.   Mental Health History:  Ilaria denied a history of prior mental health treatment and hospitalizations for psychiatric concerns. She is unaware of any family mental health history. Approximately 19 years ago, Simrin indicated she was prescribed Paxil by her PCP because "I wanted to kill my daughter-in-law." This was explored further by this provider, and she indicated, "It was just a way to vent." She denied plan and intent. Alichia indicated she was later switched to Cymbalta. Adie denied the following: history of sexual, physical, and emotional abuse as well as neglect; sleep concerns; obsessions and compulsions; mania; attention and concentration issues; substance use; engagement in self-injurious behaviors; hallucination and delusions; and current and history of suicidal and homicidal ideation, plan, and intent. While she denied appetite issues, on the PHQ-9, Azaliah endorsed poor appetite and overeating. When asked about this, she explained she was referring to the increase in carbohydrates.   Structured Assessment Results: The Patient Health Questionnaire-9 (PHQ-9) is a self-report measure that assesses symptoms and severity of depression over the course of the last two weeks. Gracilyn obtained a score of five suggesting mild depression. The following items were endorsed: feeling tired or having little energy nearly every day and poor appetite and overeating more than half the days. Blu finds the aforementioned symptoms to be somewhat difficult.   Interventions:  A chart review was conducted prior to  the clinical intake interview. The MoCA and PHQ-9 were administered and a clinical intake interview was completed. Throughout session,  empathic reflections and validation was provided. Continuing treatment with this provider and establishing a goal for treatment was discussed. Psychoeducation regarding emotional versus physical hunger was provider along with a handout. Amada reported she feels being told what to eat would be helpful; therefore, it was recommended she share this with Dr. Shary Decamp during their next appointment. Furthermore, psychoeducation regarding the role of cortisol, a stress hormone, in cravings was provided.   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 She appears able and willing to participate as evidenced by her collaboration on a treatment goal. She also asked questions throughout session as needed for clarification and engaged in reciprocal conversation. The next appointment will be scheduled in two weeks. The following treatment goal was established: reduce emotional eating.

## 2017-09-06 ENCOUNTER — Ambulatory Visit (INDEPENDENT_AMBULATORY_CARE_PROVIDER_SITE_OTHER): Payer: Medicare Other | Admitting: Cardiovascular Disease

## 2017-09-06 ENCOUNTER — Encounter: Payer: Self-pay | Admitting: Cardiovascular Disease

## 2017-09-06 VITALS — BP 122/73 | HR 89 | Ht 63.0 in | Wt 214.0 lb

## 2017-09-06 DIAGNOSIS — I480 Paroxysmal atrial fibrillation: Secondary | ICD-10-CM

## 2017-09-06 DIAGNOSIS — E7800 Pure hypercholesterolemia, unspecified: Secondary | ICD-10-CM | POA: Diagnosis not present

## 2017-09-06 DIAGNOSIS — I10 Essential (primary) hypertension: Secondary | ICD-10-CM

## 2017-09-06 NOTE — Assessment & Plan Note (Signed)
History of essential hypertension blood pressure measured at 122/73.  Is on Cartia XT 180 mg a day.  Continue current meds at current dosing

## 2017-09-06 NOTE — Progress Notes (Signed)
09/06/2017 [name]   02/12/1946  [id number]  Primary Physician [name], MD Primary Cardiologist: Lorretta Harp MD FACP, [place], [place], [place]  HPI:  [name] is a 72 y.o.  severely overweight, married Caucasian female who was seen by Dr. Aldona Bar in the office on September 08, 2011, for chest pain. I last saw her in the office  08/25/2016.Theresa KitchenMarland KitchenHer risk factors include hypertension and hyperlipidemia. She also has GERD. She was admitted for heart cath which I performed radially on September 10, 2011, that was essentially normal with normal LV function. Her 2D echo was normal as well. Her major complaints have been progressive dyspnea on exertion. She does have reactive airway disease and had pneumonia in the recent past as well. I saw her last 7-10/13 she has been evaluated at [facility] and was told she had vocal cord dysfunction. She is seeing Dr. Halford Chessman for pulmonary evaluation for her back here for evaluation of sinus tachycardia. She really denies chest pain but does get dyspneic on exertion which was a complaint when I saw her 2 years ago as well. Recent thyroid function tests were normal. An event monitor showed sinus rhythm, sinus tachycardia with episodes of paroxysmal atrial fibrillation. Based on her age, is gender and history of hypertension she would be a candidate for oral anticoagulation. Because of a CHA2DS2VASC score of 3, she was begun on Eiquis oral anticoagulations.She has had bariatric surgery performed by Dr. 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 Alvan Dame. She has since lost 90 pounds and is now ambulatory.she denies chest pain or shortness of breath. She is much more active and has more energy. A 30 day event monitor showed sinus rhythm with PACs but no evidence of PAF last year and therefore I discontinued oral anticoagulation. She recently had a right shoulder replacement by Dr. Veverly Fells and slowly recuperating from this.  She is still suffering from vocal cord dysfunction and episodic dyspnea that we do not think this is cardiovascular in nature. Since I saw her a year ago she is remained stable.  She is completely asymptomatic.     Current Meds  Medication Sig  . acetaminophen (TYLENOL) 500 MG tablet Take 1,000 mg by mouth every 6 (six) hours as needed (for pain.).   [name] albuterol (PROVENTIL HFA;VENTOLIN HFA) 108 (90 Base) MCG/ACT inhaler Inhale 1-2 puffs into the lungs every 6 (six) hours as needed for shortness of breath or wheezing.  . Biotin 10000 MCG TABS Take 10,000 mcg by mouth at bedtime.   [name] buPROPion (WELLBUTRIN SR) 200 MG 12 hr tablet Take 1 tablet (200 mg total) by mouth daily at 12 noon.  [name] CARTIA XT 180 MG 24 hr capsule TAKE 1 CAPSULE BY MOUTH ONCE DAILY  . cetirizine (ZYRTEC) 10 MG tablet Take 10 mg by mouth daily.   . cholecalciferol (VITAMIN D) 1000 units tablet Take 1 tablet (1,000 Units total) by mouth 2 (two) times daily.  . clorazepate (TRANXENE) 7.5 MG tablet Take 7.5 mg by mouth 3 (three) times daily as needed for anxiety.  [name] diltiazem (CARTIA XT) 180 MG 24 hr capsule Take 1 capsule (180 mg total) by mouth daily.  . DULoxetine (CYMBALTA) 60 MG capsule Take 60 mg by mouth daily with supper.   . Methylcellulose, Laxative, 500 MG TABS Take 1 tablet by mouth daily.   . Multiple Vitamin (MULTIVITAMIN WITH MINERALS) TABS tablet Take 1 tablet by mouth 2 (two) times  daily. Centrum  . pantoprazole (PROTONIX) 40 MG tablet Take 1 tablet (40 mg total) by mouth every morning.  . Probiotic Product (RESTORA) CAPS Take 1 capsule by mouth daily. (Patient taking differently: Take 1 capsule by mouth daily with supper. )  . simvastatin (ZOCOR) 20 MG tablet Take 1 tablet (20 mg total) by mouth daily.  . sucralfate (CARAFATE) 1 g tablet Take 1 tablet (1 g total) by mouth 4 (four) times daily -  with meals and at bedtime. May dissolve tablet in 1 tablespoon of water to take as a slurry  . traMADol (ULTRAM) 50  MG tablet Take 50 mg by mouth every 6 (six) hours as needed (for pain.).   [name] vitamin B-12 (CYANOCOBALAMIN) 1000 MCG tablet Take 1,000 mcg by mouth daily.   Current Facility-Administered Medications for the 09/06/17 encounter (Office Visit) with Lorretta Harp, MD  Medication  . 0.9 %  sodium chloride infusion  . 0.9 %  sodium chloride infusion     Allergies  Allergen Reactions  . Ativan [Lorazepam] Other (See Comments)    Agitation, aggressive actions, significant mental changes  . Buprenorphine Hcl Itching and Rash  . Fentanyl Itching  . Morphine And Related Itching and Rash    Social History   Socioeconomic History  . Marital status: Married    Spouse name: [name]  . Number of children: 2  . Years of education: Not on file  . Highest education level: Not on file  Occupational History  . Occupation: retired    Fish farm manager: RETIRED  Social Needs  . Financial resource strain: Not on file  . Food insecurity:    Worry: Not on file    Inability: Not on file  . Transportation needs:    Medical: Not on file    Non-medical: Not on file  Tobacco Use  . Smoking status: Never Smoker  . Smokeless tobacco: Never Used  Substance and Sexual Activity  . Alcohol use: No  . Drug use: No  . Sexual activity: Yes  Lifestyle  . Physical activity:    Days per week: Not on file    Minutes per session: Not on file  . Stress: Not on file  Relationships  . Social connections:    Talks on phone: Not on file    Gets together: Not on file    Attends religious service: Not on file    Active member of club or organization: Not on file    Attends meetings of clubs or organizations: Not on file    Relationship status: Not on file  . Intimate partner violence:    Fear of current or ex partner: Not on file    Emotionally abused: Not on file    Physically abused: Not on file    Forced sexual activity: Not on file  Other Topics Concern  . Not on file  Social History Narrative  . Not on file       Review of Systems: General: negative for chills, fever, night sweats or weight changes.  Cardiovascular: negative for chest pain, dyspnea on exertion, edema, orthopnea, palpitations, paroxysmal nocturnal dyspnea or shortness of breath Dermatological: negative for rash Respiratory: negative for cough or wheezing Urologic: negative for hematuria Abdominal: negative for nausea, vomiting, diarrhea, bright red blood per rectum, melena, or hematemesis Neurologic: negative for visual changes, syncope, or dizziness All other systems reviewed and are otherwise negative except as noted above.    Blood pressure 122/73, pulse 89, height 5\' 3"  (1.6 m),  weight 214 lb (97.1 kg).  General appearance: alert and no distress Neck: no adenopathy, no carotid bruit, no JVD, supple, symmetrical, trachea midline and thyroid not enlarged, symmetric, no tenderness/mass/nodules Lungs: clear to auscultation bilaterally Heart: regular rate and rhythm, S1, S2 normal, no murmur, click, rub or gallop Extremities: extremities normal, atraumatic, no cyanosis or edema Pulses: 2+ and symmetric Skin: Skin color, texture, turgor normal. No rashes or lesions Neurologic: Alert and oriented X 3, normal strength and tone. Normal symmetric reflexes. 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  I personally reviewed this EKG.  ASSESSMENT AND PLAN:   HTN (hypertension) History of essential hypertension blood pressure measured at 122/73.  Is on Cartia XT 180 mg a day.  Continue current meds at current dosing  Hyperlipemia History of hyperlipidemia on statin therapy  Paroxysmal atrial fibrillation History of paroxysmal trial fibrillation detaining sinus rhythm.  She did have an event monitor that showed no evidence of PAF and therefore oral anticoagulation was discontinued.      Lorretta Harp MD FACP,FACC,FAHA, [facility] 09/06/2017 2:35 PM

## 2017-09-06 NOTE — Patient Instructions (Signed)

## 2017-09-06 NOTE — Assessment & Plan Note (Signed)
History of hyperlipidemia on statin therapy. 

## 2017-09-06 NOTE — Assessment & Plan Note (Signed)
History of paroxysmal trial fibrillation detaining sinus rhythm.  She did have an event monitor that showed no evidence of PAF and therefore oral anticoagulation was discontinued.

## 2017-09-14 ENCOUNTER — Ambulatory Visit (INDEPENDENT_AMBULATORY_CARE_PROVIDER_SITE_OTHER): Payer: Medicare Other | Admitting: Psychology

## 2017-09-14 DIAGNOSIS — F3289 Other specified depressive episodes: Secondary | ICD-10-CM

## 2017-09-14 NOTE — Progress Notes (Signed)
Office: [phone]  /  Fax: [phone]   Date: September 14, 2017 Time Seen: 10:52am Duration: 33 minutes Provider: Glennie Isle, Psy.D. Type of Session: Individual Therapy   HPI: As indicated in the note for the initial visit with this provider on September 01, 2017: Approximately three years ago, [name] indicated she had a gastric sleeve and 30 days after that she had hip replacements. After her surgery, she reported she "couldn't tolerate sweets." She indicated she began overeating last summer and that was when she also "added the sweets." Regarding feeling tired, [name] shared it began last month and she is unsure if it is related to her medical conditions." Furthermore, [name] reported she engages in "time-based eating" and continues to gain weight as she increased her carbohydrates intake due to cravings. After indulging she feels "fine," but noted feeling mad prior to indulging (I.e., "Why can't I eat it?") She shared various stressors, including worry related to her own health, her husband's health, and her friend's husband's health. [name] also noted worry about her older sister and noted loss of loved ones. Per [name], her current stressors have contributed to overeating as "food is comfort." She also indicated she feels tired and described engaging in all or nothing thinking.  Session Content: Session focused on the goal of reducing emotional eating. [name] was receptive to today's session as evidenced by her sharing she has experienced "a lot of challenges" as it relates to her husband's health. She also shared, "I haven't done terrific with my eating." This was explored further, and [name] explained she continues to eat carbohydrates. Regarding emotional versus physical hunger, [name] shared, "I have paid attention to that." She identified snacking at 10pm is a habit and more aligned with emotional hunger versus physical hunger. Session focused on values, all or nothing thinking, the  connection between thoughts, feelings, and behaviors, as well as diaphragmatic breathing. [name] expressed skepticism regarding the connection between thoughts, feelings, and behaviors. Thus, her personal experiences were utilized as examples. She was receptive, as evidenced by her stating, "I'll try anything."   Mental Status Examination: [name] arrived early for the appointment; therefore, the appointment was initiated early. She presented as appropriately dressed and groomed. [name] appeared her stated age and demonstrated adequate orientation to time, place, person, and purpose of the appointment. She also demonstrated appropriate eye contact. No psychomotor abnormalities or behavioral peculiarities noted. Her mood was euthymic with congruent affect. Her thought processes were logical, linear, and goal-directed. No hallucinations, delusions, bizarre thinking or behavior reported or observed. Judgment, insight, and impulse control appeared to be grossly intact. There was no evidence of paraphasias (i.e., errors in speech, gross mispronunciations, and word substitutions), repetition deficits, or disturbances in volume or prosody (i.e., rhythm and intonation). There was no evidence of attention or memory impairments. [name] denied current suicidal and homicidal ideation, intent or plan.  Interventions: Content from the last session was reviewed (I.e., physical versus emotional hunger). Throughout today's session, empathic reflections and validation were provided. Brief psychoeducation regarding values was provided as well as for all or nothing thinking as it relates to her sharing she did not do terrific with her eating. Psychoeducation regarding the connection between thoughts, feelings, and behaviors was provided. [name] was given a handout for the aforementioned. Various examples from [name] experiences were utilized to help highlight the connection. Moreover, psychoeducation regarding  diaphragmatic breathing was provided. The provider explained how engaging in diaphragmatic breathing can assist with stress management and consequently impact emotional eating. [name] was  also engaged in a diaphragmatic breathing exercise, and she was encouraged to engage in the exercise one to two times a day. [name] agreed. Moreover, [name] provided written authorization for this provider to coordinate care with her referring physician.   DSM-5 Diagnosis: 311 (F32.8) Other Specified Depressive Disorder, Emotional Eating   Plan: [name] continues to appear able and willing to participate as evidenced by her engagement in reciprocal conversation, her willingness to engage in the exercise in session, and her asking for clarification when needed.The next appointment will be scheduled in three weeks as the provider will be out the week of July 8th. Session will focus on reviewing learned skills and continuing to work toward the established treatment goal.

## 2017-09-15 ENCOUNTER — Ambulatory Visit (INDEPENDENT_AMBULATORY_CARE_PROVIDER_SITE_OTHER): Payer: Medicare Other | Admitting: Family Medicine

## 2017-09-15 VITALS — BP 108/70 | HR 86 | Temp 98.5°F | Ht 63.0 in | Wt 206.0 lb

## 2017-09-15 DIAGNOSIS — F3289 Other specified depressive episodes: Secondary | ICD-10-CM | POA: Diagnosis not present

## 2017-09-15 DIAGNOSIS — E559 Vitamin D deficiency, unspecified: Secondary | ICD-10-CM | POA: Diagnosis not present

## 2017-09-15 DIAGNOSIS — Z6836 Body mass index (BMI) 36.0-36.9, adult: Secondary | ICD-10-CM

## 2017-09-15 MED ORDER — BUPROPION HCL ER (SR) 200 MG PO TB12: 200.0000 mg | ORAL_TABLET | Freq: Every day | ORAL | 0 refills | Status: DC

## 2017-09-15 NOTE — Progress Notes (Signed)
Office: [phone]  /  Fax: [phone]   HPI:   Chief Complaint: OBESITY [name] is here to discuss her progress with her obesity treatment plan. She is on the  keep a food journal with 1300 calories and 75+ grams of protein daily and is following her eating plan approximately 25 % of the time. She states she is walking 30 minutes 3 times per week. [name] did well with weight loss since her last visit. She reports that she has been eating better now that her husband is eating healthier, as he is attempting to lose weight prior to partial knee replacement. She is not following the food plan and reports journaling 1 to 2 times a week. Her weight is 206 lb (93.4 kg) today and has had a weight loss of 2 pounds over a period of 3 weeks since her last visit. She has gained 13 lbs since starting treatment with [place].  Vitamin D deficiency [name] has a diagnosis of vitamin D deficiency. She is currently taking OTC vit D 1,000 units 2 times daily. She reports afternoon fatigue and denies nausea, vomiting or muscle weakness. Her level is at goal.  Depression with emotional eating behaviors [name] reports increasing her Wellbutrin and this dose has improved her mood and decreased emotional eating. [name] struggles with emotional eating and using food for comfort to the extent that it is negatively impacting her health. She often snacks when she is not hungry. [name] sometimes feels she is out of control and then feels guilty that she made poor food choices. She has been working on behavior modification techniques to help reduce her emotional eating and has been somewhat successful. She shows no sign of suicidal or homicidal ideations.  Depression screen [facility] 2/9 09/01/2017 10/08/2016 02/04/2016 11/05/2015 08/06/2015  Decreased Interest 0 1 0 0 0  Down, Depressed, Hopeless 0 1 0 0 0  PHQ - 2 Score 0 2 0 0 0  Altered sleeping - 0 - - -  Tired, decreased energy - 0 - - -  Change in appetite - 0 - - -    Feeling bad or failure about yourself  - 0 - - -  Trouble concentrating - 0 - - -  Moving slowly or fidgety/restless - 0 - - -  Suicidal thoughts - 0 - - -  PHQ-9 Score - 2 - - -     ALLERGIES: Allergies  Allergen Reactions  . Ativan [Lorazepam] Other (See Comments)    Agitation, aggressive actions, significant mental changes  . Buprenorphine Hcl Itching and Rash  . Fentanyl Itching  . Morphine And Related Itching and Rash    MEDICATIONS: Current Outpatient Medications on File Prior to Visit  Medication Sig Dispense Refill  . acetaminophen (TYLENOL) 500 MG tablet Take 1,000 mg by mouth every 6 (six) hours as needed (for pain.).     [name] albuterol (PROVENTIL HFA;VENTOLIN HFA) 108 (90 Base) MCG/ACT inhaler Inhale 1-2 puffs into the lungs every 6 (six) hours as needed for shortness of breath or wheezing.    . Biotin 10000 MCG TABS Take 10,000 mcg by mouth at bedtime.     [name] CARTIA XT 180 MG 24 hr capsule TAKE 1 CAPSULE BY MOUTH ONCE DAILY 30 capsule 0  . cetirizine (ZYRTEC) 10 MG tablet Take 10 mg by mouth daily.     . cholecalciferol (VITAMIN D) 1000 units tablet Take 1 tablet (1,000 Units total) by mouth 2 (two) times daily. 60 tablet 0  . clorazepate (TRANXENE)  7.5 MG tablet Take 7.5 mg by mouth 3 (three) times daily as needed for anxiety.    [name] diltiazem (CARTIA XT) 180 MG 24 hr capsule Take 1 capsule (180 mg total) by mouth daily. 30 capsule 11  . DULoxetine (CYMBALTA) 60 MG capsule Take 60 mg by mouth daily with supper.     . Methylcellulose, Laxative, 500 MG TABS Take 1 tablet by mouth daily.     . Multiple Vitamin (MULTIVITAMIN WITH MINERALS) TABS tablet Take 1 tablet by mouth 2 (two) times daily. Centrum    . pantoprazole (PROTONIX) 40 MG tablet Take 1 tablet (40 mg total) by mouth every morning. 90 tablet 3  . Probiotic Product (RESTORA) CAPS Take 1 capsule by mouth daily. (Patient taking differently: Take 1 capsule by mouth daily with supper. ) 30 capsule 2  . simvastatin  (ZOCOR) 20 MG tablet Take 1 tablet (20 mg total) by mouth daily. 30 tablet 11  . sucralfate (CARAFATE) 1 g tablet Take 1 tablet (1 g total) by mouth 4 (four) times daily -  with meals and at bedtime. May dissolve tablet in 1 tablespoon of water to take as a slurry 120 tablet 0  . traMADol (ULTRAM) 50 MG tablet Take 50 mg by mouth every 6 (six) hours as needed (for pain.).     [name] vitamin B-12 (CYANOCOBALAMIN) 1000 MCG tablet Take 1,000 mcg by mouth daily.     Current Facility-Administered Medications on File Prior to Visit  Medication Dose Route Frequency Provider Last Rate Last Dose  . 0.9 %  sodium chloride infusion  500 mL Intravenous Continuous Armbruster, Carlota Raspberry, MD      . 0.9 %  sodium chloride infusion  500 mL Intravenous Continuous Armbruster, Carlota Raspberry, MD        PAST MEDICAL HISTORY: Past Medical History:  Diagnosis Date  . Allergy   . Anemia   . Antral polyp    benign  . Anxiety   . Arthritis   . Asthma   . B12 deficiency   . Back pain   . Chest pain   . Constipation   . Depression   . Diverticulosis   . Gallbladder problem   . GERD (gastroesophageal reflux disease) 09/09/2011  . History of transfusion   . HTN (hypertension) 09/09/2011  . Hyperlipemia 09/09/2011  . Hypertension   . IBS (irritable bowel syndrome)   . Internal hemorrhoid   . Joint pain   . Obesity   . Osteoarthritis   . Paroxysmal atrial fibrillation (HCC)   . PONV (postoperative nausea and vomiting)    with big surgeries, none with endos etc, with colonoscopy- 5-6 years ago could not swallow   . Shortness of breath   . Sleep apnea    borderline per patient no cpap   . SOB (shortness of breath) on exertion   . Swallowing difficulty   . Tachycardia   . Unstable angina ([place]) 09/09/2011  . Vitamin D deficiency   . Vocal cord dysfunction 2013   swelling    PAST SURGICAL HISTORY: Past Surgical History:  Procedure Laterality Date  . ABDOMINAL HYSTERECTOMY  1988  . ANKLE FUSION Right    x 2  .  BACK SURGERY  [phone]   3 ruptured discs, lower back  . BILATERAL ANTERIOR TOTAL HIP ARTHROPLASTY Bilateral 11/12/2014   Procedure: BILATERAL ANTERIOR TOTAL HIP ARTHROPLASTY;  Surgeon: Paralee Cancel, MD;  Location: [facility];  Service: Orthopedics;  Laterality: Bilateral;  . BRAVO PH STUDY [name]  03/06/2013   Procedure: BRAVO [place] STUDY;  Surgeon: Inda Castle, MD;  Location: [facility];  Service: Endoscopy;  Laterality: [name];  . CARDIAC CATHETERIZATION  2013   NORMAL  . CHOLECYSTECTOMY    . COLONOSCOPY    . ELBOW SURGERY Left    removed bone chip  . ESOPHAGOGASTRODUODENOSCOPY [name] 03/06/2013   Procedure: ESOPHAGOGASTRODUODENOSCOPY (EGD);  Surgeon: Inda Castle, MD;  Location: [facility];  Service: Endoscopy;  Laterality: [name];  . FOOT OSTEOTOMY W/ PLANTAR FASCIA RELEASE Left   . ILIOTIBIAL BAND RELEASE Right 1997  . JOINT REPLACEMENT    . LAPAROSCOPIC GASTRIC SLEEVE RESECTION WITH HIATAL HERNIA REPAIR  08/20/2014   Procedure: LAPAROSCOPIC GASTRIC SLEEVE RESECTION WITH HIATAL HERNIA  REPAIR AND  UPPER ENDOSCOPY;  Surgeon: Johnathan Hausen, MD;  Location: [facility];  Service: General;;  . LEFT HEART CATHETERIZATION WITH CORONARY ANGIOGRAM Bilateral 09/10/2011   Procedure: LEFT HEART CATHETERIZATION WITH CORONARY ANGIOGRAM;  Surgeon: Lorretta Harp, MD;  Location: [facility] CATH LAB;  Service: Cardiovascular;  Laterality: Bilateral;  . NASAL SINUS SURGERY  1997  . REVERSE SHOULDER ARTHROPLASTY Right 05/14/2016   Procedure: RIGHT REVERSE SHOULDER ARTHROPLASTY;  Surgeon: Netta Cedars, MD;  Location: [facility];  Service: Orthopedics;  Laterality: Right;  . SHOULDER SURGERY Left   . tfc wrist Left 1997  . thumb replacement Bilateral    one 2009 and one 2014  . TOTAL ANKLE REPLACEMENT Right    x 3  . TOTAL ANKLE REPLACEMENT Left   . TOTAL ANKLE REPLACEMENT    . TOTAL HIP ARTHROPLASTY Bilateral   . TOTAL KNEE ARTHROPLASTY Bilateral    left 2002, right 2012  . TOTAL SHOULDER ARTHROPLASTY Right  05/14/2016  . TRANSTHORACIC ECHOCARDIOGRAM  09/09/2011   MILD CONCENTRIC LVH. TRACE MITRAL REGURG. TRACE TR. MILD AORTIC REGURG.  . 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 UPPER GI ENDOSCOPY  08/20/2014   Procedure: UPPER GI ENDOSCOPY;  Surgeon: Johnathan Hausen, MD;  Location: [facility];  Service: General;;  . WRIST SURGERY Left 1997, 2009     SOCIAL HISTORY: Social History   Tobacco Use  . Smoking status: Never Smoker  . Smokeless tobacco: Never Used  Substance Use Topics  . Alcohol use: No  . Drug use: No    FAMILY HISTORY: Family History  Problem Relation Age of Onset  . Hypertension Mother   . Diabetes Mother   . Arthritis Mother   . Esophageal cancer Mother   . Cancer Mother   . Esophageal cancer Father 56  . Cancer Father   . Alcoholism Father   . Other Sister        kidney mass removed  . Hypertension Sister   . Heart disease Maternal Grandmother   . Stroke Paternal Grandmother   . Coronary artery disease Neg Hx   . Colon cancer Neg Hx   . Liver disease Neg Hx   . Kidney disease Neg Hx   . Rectal cancer Neg Hx   . Stomach cancer Neg Hx     ROS: Review of Systems  Constitutional: Positive for malaise/fatigue and weight loss.  Gastrointestinal: Negative for nausea.  Musculoskeletal:       Negative for muscle weakness  Psychiatric/Behavioral: Positive for depression. Negative for suicidal ideas.    PHYSICAL EXAM: Blood pressure 108/70, pulse 86, temperature 98.5 F (36.9 C), temperature source Oral, height 5\' 3"  (1.6 m), weight 206 lb (93.4 kg), SpO2 99 %. Body mass index is 36.49  kg/m. Physical Exam  Constitutional: She is oriented to person, place, and time. She appears well-developed and well-nourished.  Cardiovascular: Normal rate.  Pulmonary/Chest: Effort normal.  Musculoskeletal: Normal range of motion.  Neurological: She is oriented to person, place, and time.  Skin: Skin is warm and dry.  Psychiatric: She has a normal mood and affect. Her  behavior is normal.  Vitals reviewed.   RECENT LABS AND TESTS: BMET    Component Value Date/Time   NA 143 03/04/2017 1109   NA 146 (H) 02/15/2017 1207   K 3.8 03/04/2017 1109   CL 108 03/04/2017 1109   CO2 30 03/04/2017 1109   GLUCOSE 76 03/04/2017 1109   BUN 18 03/04/2017 1109   BUN 15 02/15/2017 1207   CREATININE 0.99 03/04/2017 1109   CALCIUM 8.8 03/04/2017 1109   GFRNONAA 77 02/15/2017 1207   GFRAA 89 02/15/2017 1207   Lab Results  Component Value Date   HGBA1C 5.4 10/08/2016   HGBA1C 5.8 (H) 10/13/2014   Lab Results  Component Value Date   INSULIN 8.2 02/15/2017   INSULIN 9.8 10/08/2016   CBC    Component Value Date/Time   WBC 4.2 03/04/2017 1109   RBC 4.09 03/04/2017 1109   HGB 12.7 03/04/2017 1109   HGB 13.0 02/15/2017 1207   HCT 39.0 03/04/2017 1109   HCT 40.0 02/15/2017 1207   PLT 183.0 03/04/2017 1109   MCV 95.3 03/04/2017 1109   MCV 94 02/15/2017 1207   MCH 30.4 02/15/2017 1207   MCH 31.7 05/10/2016 1304   MCHC 32.7 03/04/2017 1109   RDW 13.1 03/04/2017 1109   RDW 13.3 02/15/2017 1207   LYMPHSABS 1.4 03/04/2017 1109   LYMPHSABS 1.4 02/15/2017 1207   MONOABS 0.3 03/04/2017 1109   EOSABS 0.1 03/04/2017 1109   EOSABS 0.1 02/15/2017 1207   BASOSABS 0.0 03/04/2017 1109   BASOSABS 0.0 02/15/2017 1207   Iron/TIBC/Ferritin/ %Sat No results found for: IRON, TIBC, FERRITIN, IRONPCTSAT Lipid Panel     Component Value Date/Time   CHOL 204 (H) 02/15/2017 1207   TRIG 115 02/15/2017 1207   HDL 84 02/15/2017 1207   LDLCALC 97 02/15/2017 1207   Hepatic Function Panel     Component Value Date/Time   PROT 6.0 03/04/2017 1109   PROT 5.8 (L) 02/15/2017 1207   ALBUMIN 3.8 03/04/2017 1109   ALBUMIN 4.1 02/15/2017 1207   AST 22 03/04/2017 1109   ALT 14 03/04/2017 1109   ALKPHOS 78 03/04/2017 1109   BILITOT 0.5 03/04/2017 1109   BILITOT 0.3 02/15/2017 1207      Component Value Date/Time   TSH 1.60 09/30/2016   TSH 0.771 10/13/2014 1700   TSH 1.37  08/03/2013 1421   Results for [name], [name] (MRN [id number]) as of 09/15/2017 15:57  Ref. Range 02/15/2017 12:07  Vitamin D, 25-Hydroxy Latest Ref Range: 30.0 - 100.0 ng/mL 54.1   ASSESSMENT AND PLAN: Vitamin D deficiency - Plan: VITAMIN D 25 Hydroxy (Vit-D Deficiency, Fractures)  Other depression - with emotional eating - Plan: buPROPion (WELLBUTRIN SR) 200 MG 12 hr tablet  Class 2 severe obesity with serious comorbidity and body mass index (BMI) of 36.0 to 36.9 in adult, unspecified obesity type ([place])  PLAN:  Vitamin D Deficiency Zykera was informed that low vitamin D levels contributes to fatigue and are associated with obesity, breast, and colon cancer. She agrees to continue to take OTC Vit D @1 ,000 IU 2 times daily and we will recheck vitamin D level today. [name]  will follow up for routine testing of vitamin D, at least 2-3 times per year. She was informed of the risk of over-replacement of vitamin D and agrees to not increase her dose unless she discusses this with [place] first. [name] agrees to follow up as directed.  Depression with Emotional Eating Behaviors We discussed behavior modification techniques today to help Jalen deal with her emotional eating and depression. She has agreed to take Wellbutrin SR 200 mg qd #30 with no refills and follow up as directed.  Obesity [name] is currently in the action stage of change. As such, her goal is to continue with weight loss efforts She has agreed to keep a food journal with 1300 calories and 75 grams of protein daily [name] has been instructed to work up to a goal of 150 minutes of combined cardio and strengthening exercise per week for weight loss and overall health benefits. We discussed the following Behavioral Modification Strategies today: keep a strict food journal, decrease eating out and work on meal planning and easy cooking plans  [name] has agreed to follow up with our clinic in 4 weeks. She was informed of the  importance of frequent follow up visits to maximize her success with intensive lifestyle modifications for her multiple health conditions.   OBESITY BEHAVIORAL INTERVENTION VISIT  Today's visit was # 12 out of 22.  Starting weight: 193 lbs Starting date: 10/08/16 Today's weight : 206 lbs Today's date: 09/15/2017 Total lbs lost to date: 0 (Patients must lose 7 lbs in the first 6 months to continue with counseling)   ASK: We discussed the diagnosis of obesity with [name] today and [name] agreed to give [place] permission to discuss obesity behavioral modification therapy today.  ASSESS: [name] has the diagnosis of obesity and her BMI today is 36.5 [name] is in the action stage of change   ADVISE: [name] was educated on the multiple health risks of obesity as well as the benefit of weight loss to improve her health. She was advised of the need for long term treatment and the importance of lifestyle modifications.  AGREE: Multiple dietary modification options and treatment options were discussed and  [name] agreed to the above obesity treatment plan.  I, Doreene Nest, am acting as transcriptionist for Dennard Nip, MD  I have reviewed the above documentation for accuracy and completeness, and I agree with the above. -Dennard Nip, MD

## 2017-09-16 LAB — VITAMIN D 25 HYDROXY (VIT D DEFICIENCY, FRACTURES): Vit D, 25-Hydroxy: 60.3 ng/mL (ref 30.0–100.0)

## 2017-09-20 NOTE — Progress Notes (Unsigned)
  Office: [phone]  /  Fax: [phone]  Date: October 03, 2017  Time Seen:*** Duration:*** Provider: Glennie Isle, Psy.D. Type of Session: Individual Therapy   HPI: As indicated in the note for the initial visit with this provider on September 01, 2017: Approximately three years ago, Tonea indicated she had a gastric sleeve and 30 days after that she had hip replacements. After her surgery, she reported she "couldn't tolerate sweets." She indicated she began overeating last summer and that was when she also "added the sweets." Regarding feeling tired, Kariyah shared it began last month and she is unsure if it is related to her medical conditions." Furthermore, Kaleia reported she engages in "time-based eating" and continues to gain weight as she increased her carbohydrates intake due to cravings. After indulging she feels "fine," but noted feeling mad prior to indulging (I.e., "Why can't I eat it?") She shared various stressors, including worry related to her own health, her husband's health, and her friend's husband's health. Fayth also noted worry about her older sister and noted loss of loved ones. Per Rip Harbour, her current stressors have contributed to overeating as "food is comfort." She also indicated she feels tired and described engaging in all or nothing thinking.  Session Content: Session focused on the goal of reducing emotional eating. The session was initiated with a brief check-in.  The provider briefly reviewed diaphragmatic breathing and the connection between thoughts, feelings, and behaviors.  Moreover, Riah was introduced to mindfulness, and led through a mindfulness exercise.     Dierra was receptive to today's session as evidenced by ***.  Mental Status Examination: Ayvah arrived on time for the appointment. She presented as appropriately dressed and groomed. Maryjo appeared her stated age and demonstrated adequate orientation to time, place, person, and purpose of  the appointment. She also demonstrated appropriate eye contact. No psychomotor abnormalities or behavioral peculiarities noted. Her mood was *** with congruent affect. Her thought processes were logical, linear, and goal-directed. No hallucinations, delusions, bizarre thinking or behavior reported or observed. Judgment, insight, and impulse control appeared to be grossly intact. There was no evidence of paraphasias (i.e., errors in speech, gross mispronunciations, and word substitutions), repetition deficits, or disturbances in volume or prosody (i.e., rhythm and intonation). There was no evidence of attention or memory impairments. Letecia denied current suicidal and homicidal ideation, intent or plan.  Interventions: Content from the last session was reviewed (I.e., connection between thoughts, feelings, and behaviors as well as diaphragmatic breathing). Throughout today's session, empathic reflections and validation were provided. Psychoeducation regarding mindfulness was provided, and Venita wad led through a mindfulness exercise. Nara was provided with a handout about mindfulness.   DSM-5 Diagnosis: 311 (F32.8) Other Specified Depressive Disorder, Emotional Eating  Plan: Loana continues to appear able and willing to participate as evidenced by ***. The next appointment will be scheduled in ***. Session will focus on reviewing learned skills, and continuing to practice mindfulness to help reduce emotional eating.

## 2017-09-25 ENCOUNTER — Other Ambulatory Visit: Payer: Self-pay | Admitting: Cardiovascular Disease

## 2017-09-26 NOTE — Telephone Encounter (Signed)
Rx sent to pharmacy   

## 2017-10-03 ENCOUNTER — Encounter (INDEPENDENT_AMBULATORY_CARE_PROVIDER_SITE_OTHER): Payer: Self-pay

## 2017-10-03 ENCOUNTER — Ambulatory Visit (INDEPENDENT_AMBULATORY_CARE_PROVIDER_SITE_OTHER): Payer: Self-pay | Admitting: Psychology

## 2017-10-26 ENCOUNTER — Other Ambulatory Visit (INDEPENDENT_AMBULATORY_CARE_PROVIDER_SITE_OTHER): Payer: Self-pay | Admitting: Family Medicine

## 2017-10-26 DIAGNOSIS — F3289 Other specified depressive episodes: Secondary | ICD-10-CM

## 2017-10-31 ENCOUNTER — Other Ambulatory Visit (INDEPENDENT_AMBULATORY_CARE_PROVIDER_SITE_OTHER): Payer: Self-pay | Admitting: Family Medicine

## 2017-10-31 DIAGNOSIS — F3289 Other specified depressive episodes: Secondary | ICD-10-CM

## 2017-11-01 DIAGNOSIS — M65311 Trigger thumb, right thumb: Secondary | ICD-10-CM | POA: Diagnosis not present

## 2017-11-01 DIAGNOSIS — M65312 Trigger thumb, left thumb: Secondary | ICD-10-CM | POA: Diagnosis not present

## 2017-11-02 ENCOUNTER — Ambulatory Visit (INDEPENDENT_AMBULATORY_CARE_PROVIDER_SITE_OTHER): Payer: Medicare Other | Admitting: Psychology

## 2017-11-02 ENCOUNTER — Ambulatory Visit (INDEPENDENT_AMBULATORY_CARE_PROVIDER_SITE_OTHER): Payer: Medicare Other | Admitting: Family Medicine

## 2017-11-02 VITALS — BP 123/76 | HR 98 | Temp 98.6°F | Ht 63.0 in | Wt 212.0 lb

## 2017-11-02 DIAGNOSIS — F3289 Other specified depressive episodes: Secondary | ICD-10-CM | POA: Diagnosis not present

## 2017-11-02 DIAGNOSIS — E559 Vitamin D deficiency, unspecified: Secondary | ICD-10-CM

## 2017-11-02 DIAGNOSIS — Z6837 Body mass index (BMI) 37.0-37.9, adult: Secondary | ICD-10-CM

## 2017-11-02 MED ORDER — TOPIRAMATE 25 MG PO TABS
25.0000 mg | ORAL_TABLET | Freq: Two times a day (BID) | ORAL | 0 refills | Status: DC
Start: 2017-11-02 — End: 2017-11-17

## 2017-11-02 MED ORDER — BUPROPION HCL ER (SR) 200 MG PO TB12: 200.0000 mg | ORAL_TABLET | Freq: Every day | ORAL | 0 refills | Status: DC

## 2017-11-02 NOTE — Progress Notes (Signed)
Office: [phone]  /  Fax: [phone]   Date: November 02, 2017 Time Seen: 11:00am Duration: 35 minutes Provider: Glennie Isle, Psy.D. Type of Session: Individual Therapy   HPI: As indicated in the note for the initial visit with this provider on September 01, 2017: Approximately three years ago, [name] indicated she had a gastric sleeve and 30 days after that she had hip replacements. After her surgery, she reported she "couldn't tolerate sweets." She indicated she began overeating last summer and that was when she also "added the sweets." Regarding feeling tired, [name] shared it began last month and she is unsure if it is related to her medical conditions." Furthermore, [name] reported she engages in "time-based eating" and continues to gain weight as she increased her carbohydrates intake due to cravings. After indulging she feels "fine," but noted feeling mad prior to indulging (I.e., "Why can't I eat it?") She shared various stressors, including worry related to her own health, her husband's health, and her friend's husband's health. [name] also noted worry about her older sister and noted loss of loved ones. Per [name], her current stressors have contributed to overeating as "food is comfort." She also indicated she feels tired and described engaging in all or nothing thinking.  Session Content: Session focused on the goal of reduce emotional eating. The session was initiated with the administration of the PHQ-9 and GAD-7, as well as a a brief check-in. 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 She shared her husband had surgery, which impacted her ability to attend appointments. Additionally, [name] asked, "What do you think you and I are going to accomplish?" [name] explained, "I did horrible" as it relates to her eating choices. She discussed her previous nutritionist was "more structured," which she feels is not what she is getting that with the clinic. Thus,  this was explored further. It was recommended Dakari speak with [name] regarding switching to a meal plan from protein and calorie goals. The provider also discussed increasing the frequency of appointments with [name] to increase accountability. She agreed and was receptive to the provider talking to [name] after today's appointment. [name] also reported sleep difficulties. Thus, sleep hygiene was discussed. The impact of sleep on eating habits was also discussed. [name] was given a sleep hygiene handout and aspects to focus on were marked for [name].   [name] was receptive to today's session as evidenced by engagement in reciprocal conversation, her sharing her concerns, and willingness to speak with [name] about the recommendations provided.   Mental Status Examination: [name] arrived on time for the appointment. She presented as appropriately dressed and groomed. [name] appeared her stated age and demonstrated adequate orientation to time, place, person, and purpose of the appointment. She also demonstrated appropriate eye contact. No psychomotor abnormalities or behavioral peculiarities noted. Her mood was euthymic with congruent affect. Her thought processes were logical, linear, and goal-directed. No hallucinations, delusions, bizarre thinking or behavior reported or observed. Judgment, insight, and impulse control appeared to be grossly intact. There was no evidence of paraphasias (i.e., errors in speech, gross mispronunciations, and word substitutions), repetition deficits, or disturbances in volume or prosody (i.e., rhythm and intonation). There was no evidence of attention or memory impairments. [name] denied current suicidal and homicidal ideation, intent or plan.  Structured Assessment Results: The Patient Health Questionnaire-9 (PHQ-9) is a self-report measure that assesses symptoms and severity of depression over the course of the last two weeks. [name] obtained a  score of eight suggesting mild depression. [name] finds the endorsed symptoms to be not difficult at all. Depression screen [name] 2/9 11/02/2017  Decreased Interest 0  Down, Depressed, Hopeless 1  PHQ - 2 Score 1  Altered sleeping 3  Tired, decreased energy 1  Change in appetite 3  Feeling bad or failure about yourself  0  Trouble concentrating 0  Moving slowly or fidgety/restless 0  Suicidal thoughts 0  PHQ-9 Score 8   The Generalized Anxiety Disorder-7 (GAD-7) is a brief self-report measure that assesses symptoms of anxiety over the course of the last two weeks. [name] obtained a score of two suggesting minimal anxiety.  GAD 7 : Generalized Anxiety Score 11/02/2017  Nervous, Anxious, on Edge 1  Control/stop worrying 0  Worry too much - different things 0  Trouble relaxing 0  Restless 0  Easily annoyed or irritable 1  Afraid - awful might happen 0  Total GAD 7 Score 2  Anxiety Difficulty Not difficult at all   Interventions: Tanish was administered the PHQ-9 and GAD-7 for symptom monitoring. Throughout today's session, empathic reflections and validation were provided. Psychoeducation regarding the benefit of continued psychotherapy was provided. The provider also engaged [name] in problem solving to assist in her decision of continuing with the clinic. Psychoeducation regarding sleep hygiene was provided.   DSM-5 Diagnosis: 311 (F32.8) Other Specified Depressive Disorder, Emotional Eating  Plan: [name] continues to appear able and willing to participate as evidenced by engagement in reciprocal conversation, and asking questions for clarification as appropriate. The next appointment will be scheduled in two weeks. The next session will focus on reviewing learned skills, and working towards the established treatment goal.

## 2017-11-02 NOTE — Progress Notes (Signed)
Office: [phone]  /  Fax: [phone]   HPI:   Chief Complaint: OBESITY [name] is here to discuss her progress with her obesity treatment plan. She is on the keep a food journal with 1300 calories and 75 grams of protein daily plan and is following her eating plan approximately 20 % of the time. She states she is exercising 0 minutes 0 times per week. Kyndell has been off track with journaling while caring for her husband after knee surgery. She has increased emotional eating and has been baking more and eating more breads. She is eating out frequently and her husband has tempted her off track more. She wants to change to a more structured plan. Her weight is 212 lb (96.2 kg) today and has had a weight gain of 6 pounds over a period of 6 to 7 weeks since her last visit. She has gained 19 lbs since starting treatment with [place].  Vitamin D deficiency [name] has a diagnosis of vitamin D deficiency. She is stable on vit D but she is not yet at goal. [name] denies nausea, vomiting or muscle weakness.  Depression with emotional eating behaviors [name] is still struggling with emotional eating, even on Wellbutrin. She struggles with emotional eating and using food for comfort to the extent that it is negatively impacting her health. She often snacks when she is not hungry. [name] sometimes feels she is out of control and then feels guilty that she made poor food choices. She has been working on behavior modification techniques to help reduce her emotional eating and has been somewhat successful. She shows no sign of suicidal or homicidal ideations.  Depression screen [name], [name] 2/9 11/02/2017 09/01/2017 10/08/2016 02/04/2016 11/05/2015  Decreased Interest 0 0 1 0 0  Down, Depressed, Hopeless 1 0 1 0 0  PHQ - 2 Score 1 0 2 0 0  Altered sleeping 3 - 0 - -  Tired, decreased energy 1 - 0 - -  Change [name] appetite 3 - 0 - -  Feeling bad or failure about yourself  0 - 0 - -  Trouble concentrating 0 - 0 - -   Moving slowly or fidgety/restless 0 - 0 - -  Suicidal thoughts 0 - 0 - -  PHQ-9 Score 8 - 2 - -      ALLERGIES: Allergies  Allergen Reactions  . Ativan [Lorazepam] Other (See Comments)    Agitation, aggressive actions, significant mental changes  . Buprenorphine Hcl Itching and Rash  . Fentanyl Itching  . Morphine And Related Itching and Rash    MEDICATIONS: Current Outpatient Medications on File Prior to Visit  Medication Sig Dispense Refill  . acetaminophen (TYLENOL) 500 MG tablet Take 1,000 mg by mouth every 6 (six) hours as needed (for pain.).     [name] albuterol (PROVENTIL HFA;VENTOLIN HFA) 108 (90 Base) MCG/ACT inhaler Inhale 1-2 puffs into the lungs every 6 (six) hours as needed for shortness of breath or wheezing.    . Biotin 10000 MCG TABS Take 10,000 mcg by mouth at bedtime.     [name] buPROPion (WELLBUTRIN SR) 200 MG 12 hr tablet Take 1 tablet (200 mg total) by mouth daily at 12 noon. 30 tablet 0  . cetirizine (ZYRTEC) 10 MG tablet Take 10 mg by mouth daily.     . cholecalciferol (VITAMIN D) 1000 units tablet Take 1 tablet (1,000 Units total) by mouth 2 (two) times daily. 60 tablet 0  . clorazepate (TRANXENE) 7.5 MG tablet Take 7.5 mg by  mouth 3 (three) times daily as needed for anxiety.    [name] diltiazem (CARTIA XT) 180 MG 24 hr capsule Take 1 capsule (180 mg total) by mouth daily. 30 capsule 11  . diltiazem (CARTIA XT) 180 MG 24 hr capsule Take 1 capsule (180 mg total) by mouth daily. 30 capsule 3  . DULoxetine (CYMBALTA) 60 MG capsule Take 60 mg by mouth daily with supper.     . Methylcellulose, Laxative, 500 MG TABS Take 1 tablet by mouth daily.     . Multiple Vitamin (MULTIVITAMIN WITH MINERALS) TABS tablet Take 1 tablet by mouth 2 (two) times daily. Centrum    . pantoprazole (PROTONIX) 40 MG tablet Take 1 tablet (40 mg total) by mouth every morning. 90 tablet 3  . Probiotic Product (RESTORA) CAPS Take 1 capsule by mouth daily. (Patient taking differently: Take 1 capsule by  mouth daily with supper. ) 30 capsule 2  . simvastatin (ZOCOR) 20 MG tablet Take 1 tablet (20 mg total) by mouth daily. 30 tablet 11  . sucralfate (CARAFATE) 1 g tablet Take 1 tablet (1 g total) by mouth 4 (four) times daily -  with meals and at bedtime. May dissolve tablet [name] 1 tablespoon of water to take as a slurry 120 tablet 0  . traMADol (ULTRAM) 50 MG tablet Take 50 mg by mouth every 6 (six) hours as needed (for pain.).     [name] vitamin B-12 (CYANOCOBALAMIN) 1000 MCG tablet Take 1,000 mcg by mouth daily.     Current Facility-Administered Medications on File Prior to Visit  Medication Dose Route Frequency Provider Last Rate Last Dose  . 0.9 %  sodium chloride infusion  500 mL Intravenous Continuous Armbruster, Carlota Raspberry, MD      . 0.9 %  sodium chloride infusion  500 mL Intravenous Continuous Armbruster, Carlota Raspberry, MD        PAST MEDICAL HISTORY: Past Medical History:  Diagnosis Date  . Allergy   . Anemia   . Antral polyp    benign  . Anxiety   . Arthritis   . Asthma   . B12 deficiency   . Back pain   . Chest pain   . Constipation   . Depression   . Diverticulosis   . Gallbladder problem   . GERD (gastroesophageal reflux disease) 09/09/2011  . History of transfusion   . HTN (hypertension) 09/09/2011  . Hyperlipemia 09/09/2011  . Hypertension   . IBS (irritable bowel syndrome)   . Internal hemorrhoid   . Joint pain   . Obesity   . Osteoarthritis   . Paroxysmal atrial fibrillation (HCC)   . PONV (postoperative nausea and vomiting)    with big surgeries, none with endos etc, with colonoscopy- 5-6 years ago could not swallow   . Shortness of breath   . Sleep apnea    borderline per patient no cpap   . SOB (shortness of breath) on exertion   . Swallowing difficulty   . Tachycardia   . Unstable angina ([place]) 09/09/2011  . Vitamin D deficiency   . Vocal cord dysfunction 2013   swelling    PAST SURGICAL HISTORY: Past Surgical History:  Procedure Laterality Date  .  ABDOMINAL HYSTERECTOMY  1988  . ANKLE FUSION Right    x 2  . BACK SURGERY  [phone]   3 ruptured discs, lower back  . BILATERAL ANTERIOR TOTAL HIP ARTHROPLASTY Bilateral 11/12/2014   Procedure: BILATERAL ANTERIOR TOTAL HIP ARTHROPLASTY;  Surgeon: Paralee Cancel, MD;  Location: [facility];  Service: Orthopedics;  Laterality: Bilateral;  . BRAVO [place] STUDY [name] 03/06/2013   Procedure: BRAVO [place] STUDY;  Surgeon: Inda Castle, MD;  Location: [facility];  Service: Endoscopy;  Laterality: [name];  . CARDIAC CATHETERIZATION  2013   NORMAL  . CHOLECYSTECTOMY    . COLONOSCOPY    . ELBOW SURGERY Left    removed bone chip  . ESOPHAGOGASTRODUODENOSCOPY [name] 03/06/2013   Procedure: ESOPHAGOGASTRODUODENOSCOPY (EGD);  Surgeon: Inda Castle, MD;  Location: [facility];  Service: Endoscopy;  Laterality: [name];  . FOOT OSTEOTOMY W/ PLANTAR FASCIA RELEASE Left   . ILIOTIBIAL BAND RELEASE Right 1997  . JOINT REPLACEMENT    . LAPAROSCOPIC GASTRIC SLEEVE RESECTION WITH HIATAL HERNIA REPAIR  08/20/2014   Procedure: LAPAROSCOPIC GASTRIC SLEEVE RESECTION WITH HIATAL HERNIA  REPAIR AND  UPPER ENDOSCOPY;  Surgeon: Johnathan Hausen, MD;  Location: [facility];  Service: General;;  . LEFT HEART CATHETERIZATION WITH CORONARY ANGIOGRAM Bilateral 09/10/2011   Procedure: LEFT HEART CATHETERIZATION WITH CORONARY ANGIOGRAM;  Surgeon: Lorretta Harp, MD;  Location: [facility] C[facility] LAB;  Service: Cardiovascular;  Laterality: Bilateral;  . NASAL SINUS SURGERY  1997  . REVERSE SHOULDER ARTHROPLASTY Right 05/14/2016   Procedure: RIGHT REVERSE SHOULDER ARTHROPLASTY;  Surgeon: Netta Cedars, MD;  Location: [facility];  Service: Orthopedics;  Laterality: Right;  . SHOULDER SURGERY Left   . tfc wrist Left 1997  . thumb replacement Bilateral    one 2009 and one 2014  . TOTAL ANKLE REPLACEMENT Right    x 3  . TOTAL ANKLE REPLACEMENT Left   . TOTAL ANKLE REPLACEMENT    . TOTAL HIP ARTHROPLASTY Bilateral   . TOTAL KNEE ARTHROPLASTY Bilateral    left  2002, right 2012  . TOTAL SHOULDER ARTHROPLASTY Right 05/14/2016  . TRANSTHORACIC ECHOCARDIOGRAM  09/09/2011   MILD CONCENTRIC LVH. TRACE MITRAL REGURG. TRACE TR. MILD AORTIC REGURG.  . 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 UPPER GI ENDOSCOPY  08/20/2014   Procedure: UPPER GI ENDOSCOPY;  Surgeon: Johnathan Hausen, MD;  Location: [facility];  Service: General;;  . WRIST SURGERY Left 1997, 2009     SOCIAL HISTORY: Social History   Tobacco Use  . Smoking status: Never Smoker  . Smokeless tobacco: Never Used  Substance Use Topics  . Alcohol use: No  . Drug use: No    FAMILY HISTORY: Family History  Problem Relation Age of Onset  . Hypertension Mother   . Diabetes Mother   . Arthritis Mother   . Esophageal cancer Mother   . Cancer Mother   . Esophageal cancer Father 92  . Cancer Father   . Alcoholism Father   . Other Sister        kidney mass removed  . Hypertension Sister   . Heart disease Maternal Grandmother   . Stroke Paternal Grandmother   . Coronary artery disease Neg Hx   . Colon cancer Neg Hx   . Liver disease Neg Hx   . Kidney disease Neg Hx   . Rectal cancer Neg Hx   . Stomach cancer Neg Hx     ROS: Review of Systems  Constitutional: Negative for weight loss.  Gastrointestinal: Negative for nausea and vomiting.  Musculoskeletal:       Negative for muscle weakness  Psychiatric/Behavioral: Positive for depression. Negative for suicidal ideas.    PHYSICAL EXAM: Blood pressure 123/76, pulse 98, temperature 98.6 F (37 C), temperature source Oral, height 5\' 3"  (  1.6 m), weight 212 lb (96.2 kg), SpO2 98 %. Body mass index is 37.55 kg/m. Physical Exam  Constitutional: She is oriented to person, place, and time. She appears well-developed and well-nourished.  Cardiovascular: Normal rate.  Pulmonary/Chest: Effort normal.  Musculoskeletal: Normal range of motion.  Neurological: She is oriented to person, place, and time.  Skin: Skin is warm and dry.    Psychiatric: She has a normal mood and affect.  Vitals reviewed.   RECENT LABS AND TESTS: BMET    Component Value Date/Time   NA 143 03/04/2017 1109   NA 146 (H) 02/15/2017 1207   K 3.8 03/04/2017 1109   CL 108 03/04/2017 1109   CO2 30 03/04/2017 1109   GLUCOSE 76 03/04/2017 1109   BUN 18 03/04/2017 1109   BUN 15 02/15/2017 1207   CREATININE 0.99 03/04/2017 1109   CALCIUM 8.8 03/04/2017 1109   GFRNONAA 77 02/15/2017 1207   GFRAA 89 02/15/2017 1207   Lab Results  Component Value Date   HGBA1C 5.4 10/08/2016   HGBA1C 5.8 (H) 10/13/2014   Lab Results  Component Value Date   INSULIN 8.2 02/15/2017   INSULIN 9.8 10/08/2016   CBC    Component Value Date/Time   WBC 4.2 03/04/2017 1109   RBC 4.09 03/04/2017 1109   HGB 12.7 03/04/2017 1109   HGB 13.0 02/15/2017 1207   HCT 39.0 03/04/2017 1109   HCT 40.0 02/15/2017 1207   PLT 183.0 03/04/2017 1109   MCV 95.3 03/04/2017 1109   MCV 94 02/15/2017 1207   MCH 30.4 02/15/2017 1207   MCH 31.7 05/10/2016 1304   MCHC 32.7 03/04/2017 1109   RDW 13.1 03/04/2017 1109   RDW 13.3 02/15/2017 1207   LYMPHSABS 1.4 03/04/2017 1109   LYMPHSABS 1.4 02/15/2017 1207   MONOABS 0.3 03/04/2017 1109   EOSABS 0.1 03/04/2017 1109   EOSABS 0.1 02/15/2017 1207   BASOSABS 0.0 03/04/2017 1109   BASOSABS 0.0 02/15/2017 1207   Iron/TIBC/Ferritin/ %Sat No results found for: IRON, TIBC, FERRITIN, IRONPCTSAT Lipid Panel     Component Value Date/Time   CHOL 204 (H) 02/15/2017 1207   TRIG 115 02/15/2017 1207   HDL 84 02/15/2017 1207   LDLCALC 97 02/15/2017 1207   Hepatic Function Panel     Component Value Date/Time   PROT 6.0 03/04/2017 1109   PROT 5.8 (L) 02/15/2017 1207   ALBUMIN 3.8 03/04/2017 1109   ALBUMIN 4.1 02/15/2017 1207   AST 22 03/04/2017 1109   ALT 14 03/04/2017 1109   ALKPHOS 78 03/04/2017 1109   BILITOT 0.5 03/04/2017 1109   BILITOT 0.3 02/15/2017 1207      Component Value Date/Time   TSH 1.60 09/30/2016   TSH 0.771  10/13/2014 1700   TSH 1.37 08/03/2013 1421   Results for LUCYNDA, ROSANO (MRN [id number]) as of 11/02/2017 14:58  Ref. Range 09/15/2017 13:40  Vitamin D, 25-Hydroxy Latest Ref Range: 30.0 - 100.0 ng/mL 60.3   ASSESSMENT AND PLAN: Vitamin D deficiency  Other depression - with emotional eating - Plan: buPROPion (WELLBUTRIN SR) 200 MG 12 hr tablet, topiramate (TOPAMAX) 25 MG tablet  Class 2 severe obesity with serious comorbidity and body mass index (BMI) of 37.0 to 37.9 [name] adult, unspecified obesity type ([place])  PLAN:  Vitamin D Deficiency [name] was informed that low vitamin D levels contributes to fatigue and are associated with obesity, breast, and colon cancer. She agrees to continue to take OTC Vit D @1 ,000 IU twice daily and will follow  up for routine testing of vitamin D, at least 2-3 times per year. She was informed of the risk of over-replacement of vitamin D and agrees to not increase her dose unless she discusses this with [place] first. We will check labs at the next visit and [name] will follow up as directed.  Depression with Emotional Eating Behaviors We discussed behavior modification techniques today to help Daneli deal with her emotional eating and depression. She has agreed to continue Wellbutrin SR 200 mg qd #30 with no refills and start Topiramate 25 mg qHS #30 with no refills and follow up as directed.  Obesity [name] is currently [name] the action stage of change. As such, her goal is to continue with weight loss efforts She has agreed to change to strict Category 2 plan [name] has been instructed to work up to a goal of 150 minutes of combined cardio and strengthening exercise per week for weight loss and overall health benefits. We discussed the following Behavioral Modification Strategies today: increasing lean protein intake, decreasing simple carbohydrates , decrease eating out and work on meal planning and easy cooking plans  [name] has agreed to follow up with our  clinic [name] 2 weeks. She was informed of the importance of frequent follow up visits to maximize her success with intensive lifestyle modifications for her multiple health conditions.   OBESITY BEHAVIORAL INTERVENTION VISIT  Today's visit was # 13 out of 22.  Starting weight: 193 lbs Starting date: 10/08/16 Today's weight : 212 lbs  Today's date: 11/02/2017 Total lbs lost to date: 0    ASK: We discussed the diagnosis of obesity with [name] today and [name] agreed to give [place] permission to discuss obesity behavioral modification therapy today.  ASSESS: Jahniyah has the diagnosis of obesity and her BMI today is 37.56 [name] is [name] the action stage of change   ADVISE: [name] was educated on the multiple health risks of obesity as well as the benefit of weight loss to improve her health. She was advised of the need for long term treatment and the importance of lifestyle modifications.  AGREE: Multiple dietary modification options and treatment options were discussed and  Ifeoma agreed to the above obesity treatment plan.  I, [name], am acting as transcriptionist for Dennard Nip, MD  I have reviewed the above documentation for accuracy and completeness, and I agree with the above. -Dennard Nip, MD

## 2017-11-16 ENCOUNTER — Ambulatory Visit (INDEPENDENT_AMBULATORY_CARE_PROVIDER_SITE_OTHER): Payer: Self-pay | Admitting: Bariatrics

## 2017-11-16 NOTE — Progress Notes (Unsigned)
  Office: [phone]  /  Fax: [phone]   Date: November 22, 2017 Time Seen:*** Duration:*** Provider: Glennie Isle, Psy.D. Type of Session: Individual Therapy   HPI: As indicated in the note for the initial visit with this provider on September 01, 2017:Approximately three years ago, [name] indicated she had a gastric sleeve and 30 days after that she had hip replacements. After her surgery, she reported she "couldn't tolerate sweets." She indicated she began overeating last summer and that was when she also "added the sweets." Regarding feeling tired, [name] shared it began last month and she is unsure if it is related to her medical conditions." Furthermore,[name] reported she engages in "time-based eating" and continues to gain weight as she increased her carbohydrates intake due to cravings. After indulging she feels "fine," but noted feeling mad prior to indulging (I.e., "Why can't I eat it?") She shared various stressors, including worry related to her own health, her husband's health, and her friend's husband's health. [name] also noted worry about her older sister and noted loss of loved ones. Per [name], her current stressors have contributed to overeating as "food is comfort." She also indicated she feels tired and described engaging in all or nothing thinking.  Session Content: Session focused on the following treatment goal: decrease emotional eating. The session was initiated with the administration of the PHQ-9 and GAD-7, as well as a brief check-in.  *** discuss termination planning  [name] was receptive to today's session as evidenced by ***.  Mental Status Examination: [name] arrived on time for the appointment. She presented as appropriately dressed and groomed. [name] appeared her stated age and demonstrated adequate orientation to time, place, person, and purpose of the appointment. She also demonstrated appropriate eye contact. No psychomotor abnormalities or  behavioral peculiarities noted. Her mood was {gbmood:21757} with congruent affect. Her thought processes were logical, linear, and goal-directed. No hallucinations, delusions, bizarre thinking or behavior reported or observed. Judgment, insight, and impulse control appeared to be grossly intact. There was no evidence of paraphasias (i.e., errors in speech, gross mispronunciations, and word substitutions), repetition deficits, or disturbances in volume or prosody (i.e., rhythm and intonation). There was no evidence of attention or memory impairments. [name] denied current suicidal and homicidal ideation, intent or plan.  Structured Assessment Results: The Patient Health Questionnaire-9 (PHQ-9) is a self-report measure that assesses symptoms and severity of depression over the course of the last two weeks. [name] obtained a score of *** suggesting {GBPHQ9SEVERITY:21752}. [name] finds the endorsed symptoms to be {gbphq9difficulty:21754}.  The Generalized Anxiety Disorder-7 (GAD-7) is a brief self-report measure that assesses symptoms of anxiety over the course of the last two weeks. [name] obtained a score of *** suggesting {gbgad7severity:21753}.  Interventions: [name] was administered the PHQ-9 and GAD-7 for symptom monitoring. Content from the last session was reviewed. Throughout today's session, empathic reflections and validation were provided. Psychoeducation regarding *** was provided and *** [insert other interventions].   DSM-5 Diagnosis: 311 (F32.8) Other Specified Depressive Disorder, Emotional Eating  Plan: [name] continues to appear able and willing to participate as evidenced by engagement in reciprocal conversation, and asking questions for clarification as appropriate.*** The next appointment will be scheduled in {gbweeks:21758}. The next session will focus on reviewing learned skills, and working towards the established treatment goal.

## 2017-11-17 ENCOUNTER — Ambulatory Visit (INDEPENDENT_AMBULATORY_CARE_PROVIDER_SITE_OTHER): Payer: Medicare Other | Admitting: Bariatrics

## 2017-11-17 ENCOUNTER — Other Ambulatory Visit (INDEPENDENT_AMBULATORY_CARE_PROVIDER_SITE_OTHER): Payer: Self-pay | Admitting: Bariatrics

## 2017-11-17 VITALS — BP 113/70 | HR 83 | Temp 97.5°F | Ht 63.0 in | Wt 208.0 lb

## 2017-11-17 DIAGNOSIS — R7303 Prediabetes: Secondary | ICD-10-CM | POA: Diagnosis not present

## 2017-11-17 DIAGNOSIS — E8881 Metabolic syndrome: Secondary | ICD-10-CM

## 2017-11-17 DIAGNOSIS — F3289 Other specified depressive episodes: Secondary | ICD-10-CM | POA: Diagnosis not present

## 2017-11-17 DIAGNOSIS — Z6836 Body mass index (BMI) 36.0-36.9, adult: Secondary | ICD-10-CM | POA: Diagnosis not present

## 2017-11-17 DIAGNOSIS — E7849 Other hyperlipidemia: Secondary | ICD-10-CM

## 2017-11-17 DIAGNOSIS — E559 Vitamin D deficiency, unspecified: Secondary | ICD-10-CM

## 2017-11-17 MED ORDER — TOPIRAMATE 25 MG PO TABS: 25.0000 mg | ORAL_TABLET | Freq: Two times a day (BID) | ORAL | 0 refills | Status: DC

## 2017-11-18 ENCOUNTER — Other Ambulatory Visit: Payer: Self-pay | Admitting: Cardiovascular Disease

## 2017-11-18 LAB — HEMOGLOBIN A1C
ESTIMATED AVERAGE GLUCOSE: 114 mg/dL
HEMOGLOBIN A1C: 5.6 % (ref 4.8–5.6)

## 2017-11-18 LAB — LIPID PANEL WITH LDL/HDL RATIO
CHOLESTEROL TOTAL: 190 mg/dL (ref 100–199)
HDL: 95 mg/dL (ref >39)
LDL Calculated: 83 mg/dL (ref 0–99)
LDL/HDL RATIO: 0.9 ratio (ref 0.0–3.2)
Triglycerides: 60 mg/dL (ref 0–149)
VLDL CHOLESTEROL CAL: 12 mg/dL (ref 5–40)

## 2017-11-18 LAB — COMPREHENSIVE METABOLIC PANEL
ALBUMIN: 4.3 g/dL (ref 3.5–4.8)
ALT: 20 IU/L (ref 0–32)
AST: 38 IU/L (ref 0–40)
Albumin/Globulin Ratio: 2 (ref 1.2–2.2)
Alkaline Phosphatase: 91 IU/L (ref 39–117)
BUN / CREAT RATIO: 24 (ref 12–28)
BUN: 23 mg/dL (ref 8–27)
Bilirubin Total: 0.3 mg/dL (ref 0.0–1.2)
CO2: 22 mmol/L (ref 20–29)
Calcium: 9.2 mg/dL (ref 8.7–10.3)
Chloride: 106 mmol/L (ref 96–106)
Creatinine, Ser: 0.94 mg/dL (ref 0.57–1.00)
GFR, EST AFRICAN AMERICAN: 71 mL/min/{1.73_m2} (ref >59)
GFR, EST NON AFRICAN AMERICAN: 61 mL/min/{1.73_m2} (ref >59)
GLOBULIN, TOTAL: 2.1 g/dL (ref 1.5–4.5)
Glucose: 98 mg/dL (ref 65–99)
Potassium: 4.3 mmol/L (ref 3.5–5.2)
Sodium: 143 mmol/L (ref 134–144)
TOTAL PROTEIN: 6.4 g/dL (ref 6.0–8.5)

## 2017-11-18 LAB — INSULIN, RANDOM: INSULIN: 8.4 u[IU]/mL (ref 2.6–24.9)

## 2017-11-18 LAB — VITAMIN D 25 HYDROXY (VIT D DEFICIENCY, FRACTURES): Vit D, 25-Hydroxy: 67.6 ng/mL (ref 30.0–100.0)

## 2017-11-22 ENCOUNTER — Ambulatory Visit (INDEPENDENT_AMBULATORY_CARE_PROVIDER_SITE_OTHER): Payer: Medicare Other | Admitting: Psychology

## 2017-11-22 ENCOUNTER — Encounter (INDEPENDENT_AMBULATORY_CARE_PROVIDER_SITE_OTHER): Payer: Self-pay | Admitting: Bariatrics

## 2017-11-22 NOTE — Progress Notes (Signed)
Office: [phone]  /  Fax: [phone]   HPI:   Chief Complaint: OBESITY [name] is here to discuss her progress with her obesity treatment plan. She is on the Category 2 plan and is following her eating plan approximately 100 % of the time. She states she is walking 20 to 30 minutes 5 times per week. [name] started on the category 2 diet plan one week ago. She reports "the hardest thing is breakfast". She does not like yogurt and she has difficulty with her options. [name] feels more in control. She has a history of status post lap sleeve gastric surgery 2016.  Her weight is 208 lb (94.3 kg) today and has had a weight loss of 4 pounds over a period of 2 weeks since her last visit. She has lost 0 lbs since starting treatment with [place].  Vitamin D deficiency [name] has a diagnosis of vitamin D deficiency. She is currently taking OTC vit D and denies nausea, vomiting or muscle weakness.  Insulin Resistance [name] has a diagnosis of insulin resistance based on her elevated fasting insulin level of 9.8. Her last Hgb A1c was at 5.4 Although [name]'s blood glucose readings are still under good control, insulin resistance puts her at greater risk of metabolic syndrome and diabetes. She is not taking metformin currently and continues to work on diet and exercise to decrease risk of diabetes. She denies polyphagia.  Hyperlipidemia [name] has hyperlipidemia and has been trying to improve her cholesterol levels with intensive lifestyle modification including a low saturated fat diet, exercise and weight loss. [name] is taking Zocor currently and she denies muscle aches/ myalgias.  Depression with emotional eating behaviors [name] is struggling with emotional eating and using food for comfort to the extent that it is negatively impacting her health. She often snacks when she is not hungry. [name] sometimes feels she is out of control and then feels guilty that she made poor food choices. She  is taking Wellbutrin and Topamax was started at the last visit. She has only been taking Topamax once daily. [name] denies any side effects. She has been working on behavior modification techniques to help reduce her emotional eating and has been somewhat successful. She shows no sign of suicidal or homicidal ideations.  Depression screen [facility] 2/9 11/02/2017 09/01/2017 10/08/2016 02/04/2016 11/05/2015  Decreased Interest 0 0 1 0 0  Down, Depressed, Hopeless 1 0 1 0 0  PHQ - 2 Score 1 0 2 0 0  Altered sleeping 3 - 0 - -  Tired, decreased energy 1 - 0 - -  Change in appetite 3 - 0 - -  Feeling bad or failure about yourself  0 - 0 - -  Trouble concentrating 0 - 0 - -  Moving slowly or fidgety/restless 0 - 0 - -  Suicidal thoughts 0 - 0 - -  PHQ-9 Score 8 - 2 - -     ALLERGIES: Allergies  Allergen Reactions  . Ativan [Lorazepam] Other (See Comments)    Agitation, aggressive actions, significant mental changes  . Buprenorphine Hcl Itching and Rash  . Fentanyl Itching  . Morphine And Related Itching and Rash    MEDICATIONS: Current Outpatient Medications on File Prior to Visit  Medication Sig Dispense Refill  . acetaminophen (TYLENOL) 500 MG tablet Take 1,000 mg by mouth every 6 (six) hours as needed (for pain.).     [name] albuterol (PROVENTIL HFA;VENTOLIN HFA) 108 (90 Base) MCG/ACT inhaler Inhale 1-2 puffs into the lungs every 6 (  six) hours as needed for shortness of breath or wheezing.    . Biotin 10000 MCG TABS Take 10,000 mcg by mouth at bedtime.     [name] buPROPion (WELLBUTRIN SR) 200 MG 12 hr tablet Take 1 tablet (200 mg total) by mouth daily at 12 noon. 30 tablet 0  . cetirizine (ZYRTEC) 10 MG tablet Take 10 mg by mouth daily.     . cholecalciferol (VITAMIN D) 1000 units tablet Take 1 tablet (1,000 Units total) by mouth 2 (two) times daily. 60 tablet 0  . clorazepate (TRANXENE) 7.5 MG tablet Take 7.5 mg by mouth 3 (three) times daily as needed for anxiety.    [name] diltiazem (CARTIA XT) 180 MG  24 hr capsule Take 1 capsule (180 mg total) by mouth daily. 30 capsule 11  . diltiazem (CARTIA XT) 180 MG 24 hr capsule Take 1 capsule (180 mg total) by mouth daily. 30 capsule 3  . DULoxetine (CYMBALTA) 60 MG capsule Take 60 mg by mouth daily with supper.     . Methylcellulose, Laxative, 500 MG TABS Take 1 tablet by mouth daily.     . Multiple Vitamin (MULTIVITAMIN WITH MINERALS) TABS tablet Take 1 tablet by mouth 2 (two) times daily. Centrum    . pantoprazole (PROTONIX) 40 MG tablet Take 1 tablet (40 mg total) by mouth every morning. 90 tablet 3  . Probiotic Product (RESTORA) CAPS Take 1 capsule by mouth daily. (Patient taking differently: Take 1 capsule by mouth daily with supper. ) 30 capsule 2  . simvastatin (ZOCOR) 20 MG tablet Take 1 tablet (20 mg total) by mouth daily. 30 tablet 11  . sucralfate (CARAFATE) 1 g tablet Take 1 tablet (1 g total) by mouth 4 (four) times daily -  with meals and at bedtime. May dissolve tablet in 1 tablespoon of water to take as a slurry 120 tablet 0  . traMADol (ULTRAM) 50 MG tablet Take 50 mg by mouth every 6 (six) hours as needed (for pain.).     [name] vitamin B-12 (CYANOCOBALAMIN) 1000 MCG tablet Take 1,000 mcg by mouth daily.     Current Facility-Administered Medications on File Prior to Visit  Medication Dose Route Frequency Provider Last Rate Last Dose  . 0.9 %  sodium chloride infusion  500 mL Intravenous Continuous Armbruster, Carlota Raspberry, MD      . 0.9 %  sodium chloride infusion  500 mL Intravenous Continuous Armbruster, Carlota Raspberry, MD        PAST MEDICAL HISTORY: Past Medical History:  Diagnosis Date  . Allergy   . Anemia   . Antral polyp    benign  . Anxiety   . Arthritis   . Asthma   . B12 deficiency   . Back pain   . Chest pain   . Constipation   . Depression   . Diverticulosis   . Gallbladder problem   . GERD (gastroesophageal reflux disease) 09/09/2011  . History of transfusion   . HTN (hypertension) 09/09/2011  . Hyperlipemia 09/09/2011    . Hypertension   . IBS (irritable bowel syndrome)   . Internal hemorrhoid   . Joint pain   . Obesity   . Osteoarthritis   . Paroxysmal atrial fibrillation (HCC)   . PONV (postoperative nausea and vomiting)    with big surgeries, none with endos etc, with colonoscopy- 5-6 years ago could not swallow   . Shortness of breath   . Sleep apnea    borderline per patient no cpap   .  SOB (shortness of breath) on exertion   . Swallowing difficulty   . Tachycardia   . Unstable angina ([place]) 09/09/2011  . Vitamin D deficiency   . Vocal cord dysfunction 2013   swelling    PAST SURGICAL HISTORY: Past Surgical History:  Procedure Laterality Date  . ABDOMINAL HYSTERECTOMY  1988  . ANKLE FUSION Right    x 2  . BACK SURGERY  [phone]   3 ruptured discs, lower back  . BILATERAL ANTERIOR TOTAL HIP ARTHROPLASTY Bilateral 11/12/2014   Procedure: BILATERAL ANTERIOR TOTAL HIP ARTHROPLASTY;  Surgeon: Paralee Cancel, MD;  Location: [facility];  Service: Orthopedics;  Laterality: Bilateral;  . BRAVO [place] STUDY [name] 03/06/2013   Procedure: BRAVO [place] STUDY;  Surgeon: Inda Castle, MD;  Location: [facility];  Service: Endoscopy;  Laterality: [name];  . CARDIAC CATHETERIZATION  2013   NORMAL  . CHOLECYSTECTOMY    . COLONOSCOPY    . ELBOW SURGERY Left    removed bone chip  . ESOPHAGOGASTRODUODENOSCOPY [name] 03/06/2013   Procedure: ESOPHAGOGASTRODUODENOSCOPY (EGD);  Surgeon: Inda Castle, MD;  Location: [facility];  Service: Endoscopy;  Laterality: [name];  . FOOT OSTEOTOMY W/ PLANTAR FASCIA RELEASE Left   . ILIOTIBIAL BAND RELEASE Right 1997  . JOINT REPLACEMENT    . LAPAROSCOPIC GASTRIC SLEEVE RESECTION WITH HIATAL HERNIA REPAIR  08/20/2014   Procedure: LAPAROSCOPIC GASTRIC SLEEVE RESECTION WITH HIATAL HERNIA  REPAIR AND  UPPER ENDOSCOPY;  Surgeon: Johnathan Hausen, MD;  Location: [facility];  Service: General;;  . LEFT HEART CATHETERIZATION WITH CORONARY ANGIOGRAM Bilateral 09/10/2011   Procedure: LEFT HEART  CATHETERIZATION WITH CORONARY ANGIOGRAM;  Surgeon: Lorretta Harp, MD;  Location: [facility] CATH LAB;  Service: Cardiovascular;  Laterality: Bilateral;  . NASAL SINUS SURGERY  1997  . REVERSE SHOULDER ARTHROPLASTY Right 05/14/2016   Procedure: RIGHT REVERSE SHOULDER ARTHROPLASTY;  Surgeon: Netta Cedars, MD;  Location: [facility];  Service: Orthopedics;  Laterality: Right;  . SHOULDER SURGERY Left   . tfc wrist Left 1997  . thumb replacement Bilateral    one 2009 and one 2014  . TOTAL ANKLE REPLACEMENT Right    x 3  . TOTAL ANKLE REPLACEMENT Left   . TOTAL ANKLE REPLACEMENT    . TOTAL HIP ARTHROPLASTY Bilateral   . TOTAL KNEE ARTHROPLASTY Bilateral    left 2002, right 2012  . TOTAL SHOULDER ARTHROPLASTY Right 05/14/2016  . TRANSTHORACIC ECHOCARDIOGRAM  09/09/2011   MILD CONCENTRIC LVH. TRACE MITRAL REGURG. TRACE TR. MILD AORTIC REGURG.  . 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 UPPER GI ENDOSCOPY  08/20/2014   Procedure: UPPER GI ENDOSCOPY;  Surgeon: Johnathan Hausen, MD;  Location: [facility];  Service: General;;  . WRIST SURGERY Left 1997, 2009     SOCIAL HISTORY: Social History   Tobacco Use  . Smoking status: Never Smoker  . Smokeless tobacco: Never Used  Substance Use Topics  . Alcohol use: No  . Drug use: No    FAMILY HISTORY: Family History  Problem Relation Age of Onset  . Hypertension Mother   . Diabetes Mother   . Arthritis Mother   . Esophageal cancer Mother   . Cancer Mother   . Esophageal cancer Father 40  . Cancer Father   . Alcoholism Father   . Other Sister        kidney mass removed  . Hypertension Sister   . Heart disease Maternal Grandmother   . Stroke Paternal Grandmother   . Coronary  artery disease Neg Hx   . Colon cancer Neg Hx   . Liver disease Neg Hx   . Kidney disease Neg Hx   . Rectal cancer Neg Hx   . Stomach cancer Neg Hx     ROS: Review of Systems  Constitutional: Positive for weight loss.  Gastrointestinal: Negative for nausea and  vomiting.  Musculoskeletal: Negative for myalgias (muscle aches/myalgias).       Negative for muscle weakness  Endo/Heme/Allergies:       Negative for polyphagia  Psychiatric/Behavioral: Positive for depression. Negative for suicidal ideas.    PHYSICAL EXAM: Blood pressure 113/70, pulse 83, temperature (!) 97.5 F (36.4 C), temperature source Oral, height 5\' 3"  (1.6 m), weight 208 lb (94.3 kg), SpO2 98 %. Body mass index is 36.85 kg/m. Physical Exam  Constitutional: She is oriented to person, place, and time. She appears well-developed and well-nourished.  Cardiovascular: Normal rate.  Pulmonary/Chest: Effort normal.  Musculoskeletal: Normal range of motion.  Neurological: She is oriented to person, place, and time.  Skin: Skin is warm and dry.  Psychiatric: She has a normal mood and affect. Her behavior is normal.  Vitals reviewed.   RECENT LABS AND TESTS: BMET    Component Value Date/Time   NA 143 11/17/2017 1230   K 4.3 11/17/2017 1230   CL 106 11/17/2017 1230   CO2 22 11/17/2017 1230   GLUCOSE 98 11/17/2017 1230   GLUCOSE 76 03/04/2017 1109   BUN 23 11/17/2017 1230   CREATININE 0.94 11/17/2017 1230   CALCIUM 9.2 11/17/2017 1230   GFRNONAA 61 11/17/2017 1230   GFRAA 71 11/17/2017 1230   Lab Results  Component Value Date   HGBA1C 5.6 11/17/2017   HGBA1C 5.4 10/08/2016   HGBA1C 5.8 (H) 10/13/2014   Lab Results  Component Value Date   INSULIN 8.4 11/17/2017   INSULIN 8.2 02/15/2017   INSULIN 9.8 10/08/2016   CBC    Component Value Date/Time   WBC 4.2 03/04/2017 1109   RBC 4.09 03/04/2017 1109   HGB 12.7 03/04/2017 1109   HGB 13.0 02/15/2017 1207   HCT 39.0 03/04/2017 1109   HCT 40.0 02/15/2017 1207   PLT 183.0 03/04/2017 1109   MCV 95.3 03/04/2017 1109   MCV 94 02/15/2017 1207   MCH 30.4 02/15/2017 1207   MCH 31.7 05/10/2016 1304   MCHC 32.7 03/04/2017 1109   RDW 13.1 03/04/2017 1109   RDW 13.3 02/15/2017 1207   LYMPHSABS 1.4 03/04/2017 1109    LYMPHSABS 1.4 02/15/2017 1207   MONOABS 0.3 03/04/2017 1109   EOSABS 0.1 03/04/2017 1109   EOSABS 0.1 02/15/2017 1207   BASOSABS 0.0 03/04/2017 1109   BASOSABS 0.0 02/15/2017 1207   Iron/TIBC/Ferritin/ %Sat No results found for: IRON, TIBC, FERRITIN, IRONPCTSAT Lipid Panel     Component Value Date/Time   CHOL 190 11/17/2017 1230   TRIG 60 11/17/2017 1230   HDL 95 11/17/2017 1230   LDLCALC 83 11/17/2017 1230   Hepatic Function Panel     Component Value Date/Time   PROT 6.4 11/17/2017 1230   ALBUMIN 4.3 11/17/2017 1230   AST 38 11/17/2017 1230   ALT 20 11/17/2017 1230   ALKPHOS 91 11/17/2017 1230   BILITOT 0.3 11/17/2017 1230      Component Value Date/Time   TSH 1.60 09/30/2016   TSH 0.771 10/13/2014 1700   TSH 1.37 08/03/2013 1421   Results for AVANTI, JETTER (MRN [id number]) as of 11/22/2017 09:38  Ref. Range 09/15/2017 13:40  Vitamin  D, 25-Hydroxy Latest Ref Range: 30.0 - 100.0 ng/mL 60.3   ASSESSMENT AND PLAN: Vitamin D deficiency - Plan: VITAMIN D 25 Hydroxy (Vit-D Deficiency, Fractures)  Insulin resistance - Plan: Comprehensive metabolic panel, Insulin, random, CANCELED: Hemoglobin A1c  Other hyperlipidemia - Plan: Lipid Panel With LDL/HDL Ratio  Other depression - with emotional eating - Plan: topiramate (TOPAMAX) 25 MG tablet  Class 2 severe obesity with serious comorbidity and body mass index (BMI) of 36.0 to 36.9 in adult, unspecified obesity type (HCC)  PLAN:  Vitamin D Deficiency [name] was informed that low vitamin D levels contributes to fatigue and are associated with obesity, breast, and colon cancer. She agrees to continue to take OTC Vit D @1 ,000 IU BID and will follow up for routine testing of vitamin D, at least 2-3 times per year. She was informed of the risk of over-replacement of vitamin D and agrees to not increase her dose unless she discusses this with [place] first. We will check vitamin D level today and [name] will follow up at the agreed  upon time.  Insulin Resistance Lakaisha will continue to work on weight loss, exercise, and decreasing simple carbohydrates in her diet to help decrease the risk of diabetes. She was informed that eating too many simple carbohydrates or too many calories at one sitting increases the likelihood of GI side effects. Christabella agreed to follow up with [place] as directed to monitor her progress.  Hyperlipidemia [name] was informed of the American Heart Association Guidelines emphasizing intensive lifestyle modifications as the first line treatment for hyperlipidemia. We discussed many lifestyle modifications today in depth, and Tahara will continue to work on decreasing saturated fats such as fatty red meat, butter and many fried foods. She will also increase vegetables and lean protein in her diet and continue to work on exercise and weight loss efforts. [name] agrees to continue Zocor at the current dose and we will recheck labs every 3 to 4 months.  Depression with Emotional Eating Behaviors We discussed behavior modification techniques today to help [name] deal with her emotional eating and depression. She has agreed to continue Topiramate 25 mg BID #60 with no refills and continue Wellbutrin SR 200 mg qd and follow up as directed.  Obesity [name] is currently in the action stage of change. As such, her goal is to continue with weight loss efforts She has agreed to follow the Category 2 plan with additional category 1 and 2 breakfast options. We discussed additional breakfast options. We will consider journaling, if she is doing well at the next visit. [name] has been instructed to work up to a goal of 150 minutes of combined cardio and strengthening exercise per week for weight loss and overall health benefits. We discussed the following Behavioral Modification Strategies today keeping healthy foods in the home, increasing lean protein intake and work on meal planning and easy cooking plans  [name]  has agreed to follow up with our clinic in 2 weeks. She was informed of the importance of frequent follow up visits to maximize her success with intensive lifestyle modifications for her multiple health conditions.   OBESITY BEHAVIORAL INTERVENTION VISIT  Today's visit was # 14   Starting weight: 193 lbs Starting date: 10/08/16 Today's weight : 198 lbs  Today's date: 11/17/17 Total lbs lost to date: 0 At least 15 minutes were spent on discussing the following behavioral intervention visit.   ASK: We discussed the diagnosis of obesity with [name] today and [name] agreed to  give [place] permission to discuss obesity behavioral modification therapy today.  ASSESS: Jadah has the diagnosis of obesity and her BMI today is 75.84 [name] is in the action stage of change   ADVISE: [name] was educated on the multiple health risks of obesity as well as the benefit of weight loss to improve her health. She was advised of the need for long term treatment and the importance of lifestyle modifications to improve her current health and to decrease her risk of future health problems.  AGREE: Multiple dietary modification options and treatment options were discussed and  Ronnetta agreed to follow the recommendations documented in the above note.  ARRANGE: Karmyn was educated on the importance of frequent visits to treat obesity as outlined per CMS and USPSTF guidelines and agreed to schedule her next follow up appointment today.  Corey Skains, am acting as Location manager for [employer]. Owens Shark, DO  I have reviewed the above documentation for accuracy and completeness, and I agree with the above. -Jearld Lesch, DO

## 2017-11-22 NOTE — Telephone Encounter (Signed)
Rx sent to pharmacy   

## 2017-11-29 ENCOUNTER — Other Ambulatory Visit (INDEPENDENT_AMBULATORY_CARE_PROVIDER_SITE_OTHER): Payer: Self-pay

## 2017-11-29 DIAGNOSIS — F3289 Other specified depressive episodes: Secondary | ICD-10-CM

## 2017-11-29 MED ORDER — BUPROPION HCL ER (SR) 200 MG PO TB12: 200.0000 mg | ORAL_TABLET | Freq: Every day | ORAL | 0 refills | Status: DC

## 2017-12-07 ENCOUNTER — Encounter (INDEPENDENT_AMBULATORY_CARE_PROVIDER_SITE_OTHER): Payer: Self-pay | Admitting: Bariatrics

## 2017-12-07 ENCOUNTER — Ambulatory Visit (INDEPENDENT_AMBULATORY_CARE_PROVIDER_SITE_OTHER): Payer: Medicare Other | Admitting: Bariatrics

## 2017-12-07 VITALS — BP 104/66 | HR 76 | Temp 97.5°F | Ht 63.0 in | Wt 209.0 lb

## 2017-12-07 DIAGNOSIS — E559 Vitamin D deficiency, unspecified: Secondary | ICD-10-CM

## 2017-12-07 DIAGNOSIS — E8881 Metabolic syndrome: Secondary | ICD-10-CM | POA: Diagnosis not present

## 2017-12-07 DIAGNOSIS — F3289 Other specified depressive episodes: Secondary | ICD-10-CM | POA: Diagnosis not present

## 2017-12-07 DIAGNOSIS — Z6837 Body mass index (BMI) 37.0-37.9, adult: Secondary | ICD-10-CM

## 2017-12-08 NOTE — Progress Notes (Signed)
Office: [phone]  /  Fax: [phone]   HPI:   Chief Complaint: OBESITY [name] is here to discuss her progress with her obesity treatment plan. She is following the Category 2 plan with breakfast options and is following her eating plan approximately 80 % of the time. She states she is walking 30 minutes 7 times per week. [name] has had "gastritis" with nausea and vomiting and was put back on Carafate. These symptoms are not new and she is eating more bland foods. On vacation, she ate some carbohydrates, but a minimal amount. Her hunger is controlled.  Her weight is 209 lb (94.8 kg) today and has not lost weight since her last visit. She has lost 0 lbs since starting treatment with [place].  Vitamin D deficiency [name] has a diagnosis of vitamin D deficiency. She is currently taking OTC vit D. Her last vitamin D level was 60.3 on 09/15/17. She denies nausea, vomiting or muscle weakness.  Insulin Resistance [name] has a diagnosis of insulin resistance based on her elevated fasting insulin level >5. Her last A1c was 5.6 and Insulin was 8.4 on 11/17/17. Although Laportia's blood glucose readings are still under good control, insulin resistance puts her at greater risk of metabolic syndrome and diabetes. She is not taking medications currently and continues to work on diet and exercise to decrease risk of diabetes. She denies polyphagia.  Depression with emotional eating behaviors [name] is struggling with emotional eating and using food for comfort to the extent that it is negatively impacting her health. She is taking bupropion 200mg  12 hour tablet and taking Topamax 25mg  for cravings and emotional eating. She shows no sign of suicidal or homicidal ideations.  ALLERGIES: Allergies  Allergen Reactions  . Ativan [Lorazepam] Other (See Comments)    Agitation, aggressive actions, significant mental changes  . Buprenorphine Hcl Itching and Rash  . Fentanyl Itching  . Morphine And Related  Itching and Rash    MEDICATIONS: Current Outpatient Medications on File Prior to Visit  Medication Sig Dispense Refill  . acetaminophen (TYLENOL) 500 MG tablet Take 1,000 mg by mouth every 6 (six) hours as needed (for pain.).     [name] albuterol (PROVENTIL HFA;VENTOLIN HFA) 108 (90 Base) MCG/ACT inhaler Inhale 1-2 puffs into the lungs every 6 (six) hours as needed for shortness of breath or wheezing.    . Biotin 10000 MCG TABS Take 10,000 mcg by mouth at bedtime.     [name] buPROPion (WELLBUTRIN SR) 200 MG 12 hr tablet Take 1 tablet (200 mg total) by mouth daily at 12 noon. 14 tablet 0  . cetirizine (ZYRTEC) 10 MG tablet Take 10 mg by mouth daily.     . cholecalciferol (VITAMIN D) 1000 units tablet Take 1 tablet (1,000 Units total) by mouth 2 (two) times daily. 60 tablet 0  . clorazepate (TRANXENE) 7.5 MG tablet Take 7.5 mg by mouth 3 (three) times daily as needed for anxiety.    [name] diltiazem (CARTIA XT) 180 MG 24 hr capsule Take 1 capsule (180 mg total) by mouth daily. 30 capsule 11  . diltiazem (CARTIA XT) 180 MG 24 hr capsule Take 1 capsule (180 mg total) by mouth daily. 30 capsule 3  . DULoxetine (CYMBALTA) 60 MG capsule Take 60 mg by mouth daily with supper.     . Methylcellulose, Laxative, 500 MG TABS Take 1 tablet by mouth daily.     . Multiple Vitamin (MULTIVITAMIN WITH MINERALS) TABS tablet Take 1 tablet by mouth 2 (two) times  daily. Centrum    . pantoprazole (PROTONIX) 40 MG tablet TAKE 1 TABLET BY MOUTH IN THE MORNING 90 tablet 3  . Probiotic Product (RESTORA) CAPS Take 1 capsule by mouth daily. (Patient taking differently: Take 1 capsule by mouth daily with supper. ) 30 capsule 2  . simvastatin (ZOCOR) 20 MG tablet Take 1 tablet (20 mg total) by mouth daily. 30 tablet 11  . sucralfate (CARAFATE) 1 g tablet Take 1 tablet (1 g total) by mouth 4 (four) times daily -  with meals and at bedtime. May dissolve tablet in 1 tablespoon of water to take as a slurry 120 tablet 0  . topiramate (TOPAMAX)  25 MG tablet Take 1 tablet (25 mg total) by mouth 2 (two) times daily. 60 tablet 0  . traMADol (ULTRAM) 50 MG tablet Take 50 mg by mouth every 6 (six) hours as needed (for pain.).     [name] vitamin B-12 (CYANOCOBALAMIN) 1000 MCG tablet Take 1,000 mcg by mouth daily.     Current Facility-Administered Medications on File Prior to Visit  Medication Dose Route Frequency Provider Last Rate Last Dose  . 0.9 %  sodium chloride infusion  500 mL Intravenous Continuous Armbruster, Carlota Raspberry, MD      . 0.9 %  sodium chloride infusion  500 mL Intravenous Continuous Armbruster, Carlota Raspberry, MD        PAST MEDICAL HISTORY: Past Medical History:  Diagnosis Date  . Allergy   . Anemia   . Antral polyp    benign  . Anxiety   . Arthritis   . Asthma   . B12 deficiency   . Back pain   . Chest pain   . Constipation   . Depression   . Diverticulosis   . Gallbladder problem   . GERD (gastroesophageal reflux disease) 09/09/2011  . History of transfusion   . HTN (hypertension) 09/09/2011  . Hyperlipemia 09/09/2011  . Hypertension   . IBS (irritable bowel syndrome)   . Internal hemorrhoid   . Joint pain   . Obesity   . Osteoarthritis   . Paroxysmal atrial fibrillation (HCC)   . PONV (postoperative nausea and vomiting)    with big surgeries, none with endos etc, with colonoscopy- 5-6 years ago could not swallow   . Shortness of breath   . Sleep apnea    borderline per patient no cpap   . SOB (shortness of breath) on exertion   . Swallowing difficulty   . Tachycardia   . Unstable angina ([place]) 09/09/2011  . Vitamin D deficiency   . Vocal cord dysfunction 2013   swelling    PAST SURGICAL HISTORY: Past Surgical History:  Procedure Laterality Date  . ABDOMINAL HYSTERECTOMY  1988  . ANKLE FUSION Right    x 2  . BACK SURGERY  [phone]   3 ruptured discs, lower back  . BILATERAL ANTERIOR TOTAL HIP ARTHROPLASTY Bilateral 11/12/2014   Procedure: BILATERAL ANTERIOR TOTAL HIP ARTHROPLASTY;  Surgeon:  Paralee Cancel, MD;  Location: [facility];  Service: Orthopedics;  Laterality: Bilateral;  . BRAVO [place] STUDY [name] 03/06/2013   Procedure: BRAVO [place] STUDY;  Surgeon: Inda Castle, MD;  Location: [facility];  Service: Endoscopy;  Laterality: [name];  . CARDIAC CATHETERIZATION  2013   NORMAL  . CHOLECYSTECTOMY    . COLONOSCOPY    . ELBOW SURGERY Left    removed bone chip  . ESOPHAGOGASTRODUODENOSCOPY [name] 03/06/2013   Procedure: ESOPHAGOGASTRODUODENOSCOPY (EGD);  Surgeon: Inda Castle, MD;  Location:  [facility];  Service: Endoscopy;  Laterality: [name];  . FOOT OSTEOTOMY W/ PLANTAR FASCIA RELEASE Left   . ILIOTIBIAL BAND RELEASE Right 1997  . JOINT REPLACEMENT    . LAPAROSCOPIC GASTRIC SLEEVE RESECTION WITH HIATAL HERNIA REPAIR  08/20/2014   Procedure: LAPAROSCOPIC GASTRIC SLEEVE RESECTION WITH HIATAL HERNIA  REPAIR AND  UPPER ENDOSCOPY;  Surgeon: Johnathan Hausen, MD;  Location: [facility];  Service: General;;  . LEFT HEART CATHETERIZATION WITH CORONARY ANGIOGRAM Bilateral 09/10/2011   Procedure: LEFT HEART CATHETERIZATION WITH CORONARY ANGIOGRAM;  Surgeon: Lorretta Harp, MD;  Location: [facility] CATH LAB;  Service: Cardiovascular;  Laterality: Bilateral;  . NASAL SINUS SURGERY  1997  . REVERSE SHOULDER ARTHROPLASTY Right 05/14/2016   Procedure: RIGHT REVERSE SHOULDER ARTHROPLASTY;  Surgeon: Netta Cedars, MD;  Location: [facility];  Service: Orthopedics;  Laterality: Right;  . SHOULDER SURGERY Left   . tfc wrist Left 1997  . thumb replacement Bilateral    one 2009 and one 2014  . TOTAL ANKLE REPLACEMENT Right    x 3  . TOTAL ANKLE REPLACEMENT Left   . TOTAL ANKLE REPLACEMENT    . TOTAL HIP ARTHROPLASTY Bilateral   . TOTAL KNEE ARTHROPLASTY Bilateral    left 2002, right 2012  . TOTAL SHOULDER ARTHROPLASTY Right 05/14/2016  . TRANSTHORACIC ECHOCARDIOGRAM  09/09/2011   MILD CONCENTRIC LVH. TRACE MITRAL REGURG. TRACE TR. MILD AORTIC REGURG.  . 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 UPPER GI ENDOSCOPY   08/20/2014   Procedure: UPPER GI ENDOSCOPY;  Surgeon: Johnathan Hausen, MD;  Location: [facility];  Service: General;;  . WRIST SURGERY Left 1997, 2009     SOCIAL HISTORY: Social History   Tobacco Use  . Smoking status: Never Smoker  . Smokeless tobacco: Never Used  Substance Use Topics  . Alcohol use: No  . Drug use: No    FAMILY HISTORY: Family History  Problem Relation Age of Onset  . Hypertension Mother   . Diabetes Mother   . Arthritis Mother   . Esophageal cancer Mother   . Cancer Mother   . Esophageal cancer Father 95  . Cancer Father   . Alcoholism Father   . Other Sister        kidney mass removed  . Hypertension Sister   . Heart disease Maternal Grandmother   . Stroke Paternal Grandmother   . Coronary artery disease Neg Hx   . Colon cancer Neg Hx   . Liver disease Neg Hx   . Kidney disease Neg Hx   . Rectal cancer Neg Hx   . Stomach cancer Neg Hx     ROS: Review of Systems  Constitutional: Negative for weight loss.  Respiratory:       Negative for muscle weakness.  Gastrointestinal: Negative for nausea and vomiting.  Endo/Heme/Allergies:       Negative for polyphagia.  Psychiatric/Behavioral: Positive for depression. Negative for suicidal ideas.       Negative for homicidal ideations.    PHYSICAL EXAM: Blood pressure 104/66, pulse 76, temperature (!) 97.5 F (36.4 C), temperature source Oral, height 5\' 3"  (1.6 m), weight 209 lb (94.8 kg), SpO2 99 %. Body mass index is 37.02 kg/m. Physical Exam  Constitutional: She is oriented to person, place, and time. She appears well-developed and well-nourished.  Cardiovascular: Normal rate.  Pulmonary/Chest: Effort normal.  Musculoskeletal: Normal range of motion.  Neurological: She is oriented to person, place, and time.  Skin: Skin is warm and dry.  Psychiatric: She has a normal mood and affect. Her behavior is normal.  Vitals reviewed.   RECENT LABS AND TESTS: BMET    Component Value Date/Time   NA  143 11/17/2017 1230   K 4.3 11/17/2017 1230   CL 106 11/17/2017 1230   CO2 22 11/17/2017 1230   GLUCOSE 98 11/17/2017 1230   GLUCOSE 76 03/04/2017 1109   BUN 23 11/17/2017 1230   CREATININE 0.94 11/17/2017 1230   CALCIUM 9.2 11/17/2017 1230   GFRNONAA 61 11/17/2017 1230   GFRAA 71 11/17/2017 1230   Lab Results  Component Value Date   HGBA1C 5.6 11/17/2017   HGBA1C 5.4 10/08/2016   HGBA1C 5.8 (H) 10/13/2014   Lab Results  Component Value Date   INSULIN 8.4 11/17/2017   INSULIN 8.2 02/15/2017   INSULIN 9.8 10/08/2016   CBC    Component Value Date/Time   WBC 4.2 03/04/2017 1109   RBC 4.09 03/04/2017 1109   HGB 12.7 03/04/2017 1109   HGB 13.0 02/15/2017 1207   HCT 39.0 03/04/2017 1109   HCT 40.0 02/15/2017 1207   PLT 183.0 03/04/2017 1109   MCV 95.3 03/04/2017 1109   MCV 94 02/15/2017 1207   MCH 30.4 02/15/2017 1207   MCH 31.7 05/10/2016 1304   MCHC 32.7 03/04/2017 1109   RDW 13.1 03/04/2017 1109   RDW 13.3 02/15/2017 1207   LYMPHSABS 1.4 03/04/2017 1109   LYMPHSABS 1.4 02/15/2017 1207   MONOABS 0.3 03/04/2017 1109   EOSABS 0.1 03/04/2017 1109   EOSABS 0.1 02/15/2017 1207   BASOSABS 0.0 03/04/2017 1109   BASOSABS 0.0 02/15/2017 1207   Iron/TIBC/Ferritin/ %Sat No results found for: IRON, TIBC, FERRITIN, IRONPCTSAT Lipid Panel     Component Value Date/Time   CHOL 190 11/17/2017 1230   TRIG 60 11/17/2017 1230   HDL 95 11/17/2017 1230   LDLCALC 83 11/17/2017 1230   Hepatic Function Panel     Component Value Date/Time   PROT 6.4 11/17/2017 1230   ALBUMIN 4.3 11/17/2017 1230   AST 38 11/17/2017 1230   ALT 20 11/17/2017 1230   ALKPHOS 91 11/17/2017 1230   BILITOT 0.3 11/17/2017 1230      Component Value Date/Time   TSH 1.60 09/30/2016   TSH 0.771 10/13/2014 1700   TSH 1.37 08/03/2013 1421   Results for DENEE, BOEDER (MRN [id number]) as of 12/08/2017 10:40  Ref. Range 11/17/2017 12:30  Vitamin D, 25-Hydroxy Latest Ref Range: 30.0 - 100.0 ng/mL 67.6     ASSESSMENT AND PLAN: Vitamin D deficiency  Insulin resistance  Other depression - with emotional eating  Class 2 severe obesity with serious comorbidity and body mass index (BMI) of 37.0 to 37.9 in adult, unspecified obesity type ([place])  PLAN:  Vitamin D Deficiency Caryssa was informed that low vitamin D levels contributes to fatigue and are associated with obesity, breast, and colon cancer. She agrees to continue to take OTC Vit D and to increase vitamin D rich foods in her diet. She will follow up for routine testing of vitamin D, at least 2-3 times per year. She was informed of the risk of over-replacement of vitamin D and agrees to not increase her dose unless she discusses this with [place] first.  Insulin Resistance Vaanya will continue to work on weight loss, exercise, and decreasing simple carbohydrates in her diet to help decrease the risk of diabetes. We discussed metformin including benefits and risks. She was informed that eating too many simple carbohydrates or too many calories  at one sitting increases the likelihood of GI side effects. She agreed to decrease carbohydrates and increase protein. Mkayla will not begin any medications for now. Muriah agreed to follow up with [place] as directed to monitor her progress in 2 weeks.  Depression with Emotional Eating Behaviors We discussed behavior modification techniques today to help Keyarra deal with her emotional eating and depression. She has agreed to take bupropion 200 mg 12 hour tablet. It is written for bid, but she does not think she needs it 2 times a day. She agreed to follow up as directed in 2 weeks.  Obesity Marvette is currently in the action stage of change. As such, her goal is to continue with weight loss efforts. She has agreed to follow the Category 2 plan. She will be choosing more foods from Category 2 and increasing her protein intake. Merie has been instructed to work up to a goal of 150 minutes of combined  cardio and strengthening exercise per week for weight loss and overall health benefits. We discussed the following Behavioral Modification Strategies today: increasing lean protein intake, decreasing simple carbohydrates, increasing vegetables, increase H2O intake, no skipping meals, and work on meal planning and easy cooking plans and keeping healthy foods in the home.  Valli has agreed to follow up with our clinic in 2 weeks. She was informed of the importance of frequent follow up visits to maximize her success with intensive lifestyle modifications for her multiple health conditions.   OBESITY BEHAVIORAL INTERVENTION VISIT  Today's visit was # 19  Starting weight: 193 Starting date: 10/08/16 Today's weight : Weight: 209 lb (94.8 kg)  Today's date: 12/07/2017 Total lbs lost to date: 0 At least 15 minutes were spent on discussing the following behavioral intervention visit.  ASK: We discussed the diagnosis of obesity with [name] today and Endiya agreed to give [place] permission to discuss obesity behavioral modification therapy today.  ASSESS: Austine has the diagnosis of obesity and her BMI today is 37.03. Anayia is in the action stage of change.   ADVISE: Deyana was educated on the multiple health risks of obesity as well as the benefit of weight loss to improve her health. She was advised of the need for long term treatment and the importance of lifestyle modifications to improve her current health and to decrease her risk of future health problems.  AGREE: Multiple dietary modification options and treatment options were discussed and Manaal agreed to follow the recommendations documented in the above note.  ARRANGE: Ahleah was educated on the importance of frequent visits to treat obesity as outlined per CMS and USPSTF guidelines and agreed to schedule her next follow up appointment today.  I, Marcille Blanco, am acting as Location manager for [employer]. Owens Shark, DO  I  have reviewed the above documentation for accuracy and completeness, and I agree with the above. -Jearld Lesch, DO

## 2017-12-13 ENCOUNTER — Other Ambulatory Visit (INDEPENDENT_AMBULATORY_CARE_PROVIDER_SITE_OTHER): Payer: Self-pay | Admitting: Bariatrics

## 2017-12-13 DIAGNOSIS — F3289 Other specified depressive episodes: Secondary | ICD-10-CM

## 2017-12-14 MED ORDER — BUPROPION HCL ER (SR) 200 MG PO TB12: 200.0000 mg | ORAL_TABLET | Freq: Every day | ORAL | 0 refills | Status: DC

## 2017-12-20 ENCOUNTER — Ambulatory Visit (INDEPENDENT_AMBULATORY_CARE_PROVIDER_SITE_OTHER): Payer: Self-pay | Admitting: Bariatrics

## 2017-12-20 ENCOUNTER — Encounter (INDEPENDENT_AMBULATORY_CARE_PROVIDER_SITE_OTHER): Payer: Self-pay

## 2017-12-20 DIAGNOSIS — R1031 Right lower quadrant pain: Secondary | ICD-10-CM | POA: Diagnosis not present

## 2017-12-20 DIAGNOSIS — Z6836 Body mass index (BMI) 36.0-36.9, adult: Secondary | ICD-10-CM | POA: Diagnosis not present

## 2017-12-20 DIAGNOSIS — J111 Influenza due to unidentified influenza virus with other respiratory manifestations: Secondary | ICD-10-CM | POA: Diagnosis not present

## 2017-12-20 DIAGNOSIS — J40 Bronchitis, not specified as acute or chronic: Secondary | ICD-10-CM | POA: Diagnosis not present

## 2017-12-20 DIAGNOSIS — R05 Cough: Secondary | ICD-10-CM | POA: Diagnosis not present

## 2017-12-20 DIAGNOSIS — K5792 Diverticulitis of intestine, part unspecified, without perforation or abscess without bleeding: Secondary | ICD-10-CM | POA: Diagnosis not present

## 2017-12-21 ENCOUNTER — Encounter (INDEPENDENT_AMBULATORY_CARE_PROVIDER_SITE_OTHER): Payer: Self-pay | Admitting: Family Medicine

## 2017-12-21 ENCOUNTER — Other Ambulatory Visit (INDEPENDENT_AMBULATORY_CARE_PROVIDER_SITE_OTHER): Payer: Self-pay | Admitting: Bariatrics

## 2017-12-21 DIAGNOSIS — F3289 Other specified depressive episodes: Secondary | ICD-10-CM

## 2017-12-22 MED ORDER — BUPROPION HCL ER (SR) 200 MG PO TB12: 200.0000 mg | ORAL_TABLET | Freq: Every day | ORAL | 0 refills | Status: DC

## 2018-01-02 ENCOUNTER — Other Ambulatory Visit: Payer: Self-pay

## 2018-01-02 ENCOUNTER — Ambulatory Visit (INDEPENDENT_AMBULATORY_CARE_PROVIDER_SITE_OTHER): Payer: Medicare Other | Admitting: Bariatrics

## 2018-01-02 ENCOUNTER — Telehealth: Payer: Self-pay | Admitting: Gastroenterology

## 2018-01-02 VITALS — BP 116/72 | HR 89 | Temp 97.4°F | Ht 63.0 in | Wt 207.0 lb

## 2018-01-02 DIAGNOSIS — F3289 Other specified depressive episodes: Secondary | ICD-10-CM | POA: Diagnosis not present

## 2018-01-02 DIAGNOSIS — R1011 Right upper quadrant pain: Secondary | ICD-10-CM

## 2018-01-02 DIAGNOSIS — Z6836 Body mass index (BMI) 36.0-36.9, adult: Secondary | ICD-10-CM

## 2018-01-02 DIAGNOSIS — E8881 Metabolic syndrome: Secondary | ICD-10-CM | POA: Diagnosis not present

## 2018-01-02 DIAGNOSIS — R1084 Generalized abdominal pain: Secondary | ICD-10-CM

## 2018-01-02 NOTE — Telephone Encounter (Signed)
Patient states that she has been having upper abdominal pain since middle of September, along with diarrhea. She takes pantoprazole usually once a day, since this started is taking 40 mg BID. Also taking carafate. She has not tried imodium for the loose stools, patient states that the LLQ pain is a new symptom.

## 2018-01-02 NOTE — Telephone Encounter (Signed)
Patient states she is having a flare up that is getting worse with left lower abd pain. Patient states Dr.Armbruster wanted to see her the next time this happened. Dr.Armbruster's next open ov is 11.18.19. Patient wanting advice on what to do.

## 2018-01-02 NOTE — Telephone Encounter (Signed)
Patient scheduled for first available CT on 10/25 at 0900. Patient will come by our office to pick up prep and instructions at our front desk. Patient will contact our office should her symptoms go away or will go to ED if symptoms increase.

## 2018-01-02 NOTE — Telephone Encounter (Signed)
[name] I reviewed her chart. We had discussed performing a CT scan of the abdomen and pelvis if her symptoms recurred or were severe given findings on her CT scan in 2018 (there was concern for partial outlet obstruction). Can you order this for her with contrast if she is willing to proceed? Thanks

## 2018-01-03 NOTE — Progress Notes (Signed)
Office: [phone]  /  Fax: [phone]   HPI:   Chief Complaint: OBESITY [name] is here to discuss her progress with her obesity treatment plan. She is on the  follow the Category 2 plan + increase protein and is following her eating plan approximately 0 % of the time. She states she is exercising 0 minutes 0 times per week. [name] has a history of gastritis and has been on antibiotics. She will be scheduling an appointment with her GI Specialist for increased diarrhea and is on the [employer]. She is having increased stress.  Her weight is 207 lb (93.9 kg) today and has had a weight loss of 2 pounds over a period of 4 weeks since her last visit. She has lost 0 lbs since starting treatment with [place].  Insulin Resistance [name] has a diagnosis of insulin resistance based on her elevated fasting insulin level >5. Although [name]'s blood glucose readings are still under good control, insulin resistance puts her at greater risk of metabolic syndrome and diabetes. Her Hgb A1c was 5.6 and Insulin was 8.4 on 11/17/17. She is not taking metformin currently and continues to work on diet and exercise to decrease risk of diabetes.  Depression with emotional eating behaviors [name] is struggling with emotional eating and using food for comfort to the extent that it is negatively impacting her health. She often snacks when she is not hungry. She is taking Topamax 25mg  BID and bupropion 200mg , 1 PO daily.  ALLERGIES: Allergies  Allergen Reactions  . Ativan [Lorazepam] Other (See Comments)    Agitation, aggressive actions, significant mental changes  . Buprenorphine Hcl Itching and Rash  . Fentanyl Itching  . Morphine And Related Itching and Rash    MEDICATIONS: Current Outpatient Medications on File Prior to Visit  Medication Sig Dispense Refill  . acetaminophen (TYLENOL) 500 MG tablet Take 1,000 mg by mouth every 6 (six) hours as needed (for pain.).     [name] albuterol (PROVENTIL HFA;VENTOLIN  HFA) 108 (90 Base) MCG/ACT inhaler Inhale 1-2 puffs into the lungs every 6 (six) hours as needed for shortness of breath or wheezing.    . Biotin 10000 MCG TABS Take 10,000 mcg by mouth at bedtime.     [name] buPROPion (WELLBUTRIN SR) 200 MG 12 hr tablet Take 1 tablet (200 mg total) by mouth daily at 12 noon. 30 tablet 0  . cetirizine (ZYRTEC) 10 MG tablet Take 10 mg by mouth daily.     . cholecalciferol (VITAMIN D) 1000 units tablet Take 1 tablet (1,000 Units total) by mouth 2 (two) times daily. 60 tablet 0  . clorazepate (TRANXENE) 7.5 MG tablet Take 7.5 mg by mouth 3 (three) times daily as needed for anxiety.    [name] diltiazem (CARTIA XT) 180 MG 24 hr capsule Take 1 capsule (180 mg total) by mouth daily. 30 capsule 11  . diltiazem (CARTIA XT) 180 MG 24 hr capsule Take 1 capsule (180 mg total) by mouth daily. 30 capsule 3  . DULoxetine (CYMBALTA) 60 MG capsule Take 60 mg by mouth daily with supper.     . Methylcellulose, Laxative, 500 MG TABS Take 1 tablet by mouth daily.     . Multiple Vitamin (MULTIVITAMIN WITH MINERALS) TABS tablet Take 1 tablet by mouth 2 (two) times daily. Centrum    . pantoprazole (PROTONIX) 40 MG tablet TAKE 1 TABLET BY MOUTH IN THE MORNING 90 tablet 3  . Probiotic Product (RESTORA) CAPS Take 1 capsule by mouth daily. (Patient taking  differently: Take 1 capsule by mouth daily with supper. ) 30 capsule 2  . simvastatin (ZOCOR) 20 MG tablet Take 1 tablet (20 mg total) by mouth daily. 30 tablet 11  . sucralfate (CARAFATE) 1 g tablet Take 1 tablet (1 g total) by mouth 4 (four) times daily -  with meals and at bedtime. May dissolve tablet in 1 tablespoon of water to take as a slurry 120 tablet 0  . topiramate (TOPAMAX) 25 MG tablet Take 1 tablet (25 mg total) by mouth 2 (two) times daily. 60 tablet 0  . traMADol (ULTRAM) 50 MG tablet Take 50 mg by mouth every 6 (six) hours as needed (for pain.).     [name] vitamin B-12 (CYANOCOBALAMIN) 1000 MCG tablet Take 1,000 mcg by mouth daily.      Current Facility-Administered Medications on File Prior to Visit  Medication Dose Route Frequency Provider Last Rate Last Dose  . 0.9 %  sodium chloride infusion  500 mL Intravenous Continuous Armbruster, Carlota Raspberry, MD      . 0.9 %  sodium chloride infusion  500 mL Intravenous Continuous Armbruster, Carlota Raspberry, MD        PAST MEDICAL HISTORY: Past Medical History:  Diagnosis Date  . Allergy   . Anemia   . Antral polyp    benign  . Anxiety   . Arthritis   . Asthma   . B12 deficiency   . Back pain   . Chest pain   . Constipation   . Depression   . Diverticulosis   . Gallbladder problem   . GERD (gastroesophageal reflux disease) 09/09/2011  . History of transfusion   . HTN (hypertension) 09/09/2011  . Hyperlipemia 09/09/2011  . Hypertension   . IBS (irritable bowel syndrome)   . Internal hemorrhoid   . Joint pain   . Obesity   . Osteoarthritis   . Paroxysmal atrial fibrillation (HCC)   . PONV (postoperative nausea and vomiting)    with big surgeries, none with endos etc, with colonoscopy- 5-6 years ago could not swallow   . Shortness of breath   . Sleep apnea    borderline per patient no cpap   . SOB (shortness of breath) on exertion   . Swallowing difficulty   . Tachycardia   . Unstable angina ([place]) 09/09/2011  . Vitamin D deficiency   . Vocal cord dysfunction 2013   swelling    PAST SURGICAL HISTORY: Past Surgical History:  Procedure Laterality Date  . ABDOMINAL HYSTERECTOMY  1988  . ANKLE FUSION Right    x 2  . BACK SURGERY  [phone]   3 ruptured discs, lower back  . BILATERAL ANTERIOR TOTAL HIP ARTHROPLASTY Bilateral 11/12/2014   Procedure: BILATERAL ANTERIOR TOTAL HIP ARTHROPLASTY;  Surgeon: Paralee Cancel, MD;  Location: [facility];  Service: Orthopedics;  Laterality: Bilateral;  . BRAVO [place] STUDY [name] 03/06/2013   Procedure: BRAVO [place] STUDY;  Surgeon: Inda Castle, MD;  Location: [facility];  Service: Endoscopy;  Laterality: [name];  . CARDIAC  CATHETERIZATION  2013   NORMAL  . CHOLECYSTECTOMY    . COLONOSCOPY    . ELBOW SURGERY Left    removed bone chip  . ESOPHAGOGASTRODUODENOSCOPY [name] 03/06/2013   Procedure: ESOPHAGOGASTRODUODENOSCOPY (EGD);  Surgeon: Inda Castle, MD;  Location: [facility];  Service: Endoscopy;  Laterality: [name];  . FOOT OSTEOTOMY W/ PLANTAR FASCIA RELEASE Left   . ILIOTIBIAL BAND RELEASE Right 1997  . JOINT REPLACEMENT    . LAPAROSCOPIC GASTRIC SLEEVE  RESECTION WITH HIATAL HERNIA REPAIR  08/20/2014   Procedure: LAPAROSCOPIC GASTRIC SLEEVE RESECTION WITH HIATAL HERNIA  REPAIR AND  UPPER ENDOSCOPY;  Surgeon: Johnathan Hausen, MD;  Location: [facility];  Service: General;;  . LEFT HEART CATHETERIZATION WITH CORONARY ANGIOGRAM Bilateral 09/10/2011   Procedure: LEFT HEART CATHETERIZATION WITH CORONARY ANGIOGRAM;  Surgeon: Lorretta Harp, MD;  Location[facility];  Service: Cardiovascular;  Laterality: Bilateral;  . NASAL SINUS SURGERY  1997  . REVERSE SHOULDER ARTHROPLASTY Right 05/14/2016   Procedure: RIGHT REVERSE SHOULDER ARTHROPLASTY;  Surgeon: Netta Cedars, MD;  Location: [facility];  Service: Orthopedics;  Laterality: Right;  . SHOULDER SURGERY Left   . tfc wrist Left 1997  . thumb replacement Bilateral    one 2009 and one 2014  . TOTAL ANKLE REPLACEMENT Right    x 3  . TOTAL ANKLE REPLACEMENT Left   . TOTAL ANKLE REPLACEMENT    . TOTAL HIP ARTHROPLASTY Bilateral   . TOTAL KNEE ARTHROPLASTY Bilateral    left 2002, right 2012  . TOTAL SHOULDER ARTHROPLASTY Right 05/14/2016  . TRANSTHORACIC ECHOCARDIOGRAM  09/09/2011   MILD CONCENTRIC LVH. TRACE MITRAL REGURG. TRACE TR. MILD AORTIC REGURG.  . 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 UPPER GI ENDOSCOPY  08/20/2014   Procedure: UPPER GI ENDOSCOPY;  Surgeon: Johnathan Hausen, MD;  Location: [facility];  Service: General;;  . WRIST SURGERY Left 1997, 2009     SOCIAL HISTORY: Social History   Tobacco Use  . Smoking status: Never Smoker  . Smokeless  tobacco: Never Used  Substance Use Topics  . Alcohol use: No  . Drug use: No    FAMILY HISTORY: Family History  Problem Relation Age of Onset  . Hypertension Mother   . Diabetes Mother   . Arthritis Mother   . Esophageal cancer Mother   . Cancer Mother   . Esophageal cancer Father 47  . Cancer Father   . Alcoholism Father   . Other Sister        kidney mass removed  . Hypertension Sister   . Heart disease Maternal Grandmother   . Stroke Paternal Grandmother   . Coronary artery disease Neg Hx   . Colon cancer Neg Hx   . Liver disease Neg Hx   . Kidney disease Neg Hx   . Rectal cancer Neg Hx   . Stomach cancer Neg Hx     ROS: Review of Systems  Constitutional: Positive for weight loss.  Gastrointestinal: Positive for diarrhea.  Psychiatric/Behavioral: Positive for depression.    PHYSICAL EXAM: Blood pressure 116/72, pulse 89, temperature (!) 97.4 F (36.3 C), temperature source Oral, height 5\' 3"  (1.6 m), weight 207 lb (93.9 kg), SpO2 98 %. Body mass index is 36.67 kg/m. Physical Exam  Constitutional: She is oriented to person, place, and time. She appears well-developed and well-nourished.  Cardiovascular: Normal rate.  Pulmonary/Chest: Effort normal.  Musculoskeletal: Normal range of motion.  Neurological: She is oriented to person, place, and time.  Skin: Skin is warm and dry.  Psychiatric: She has a normal mood and affect. Her behavior is normal.  Vitals reviewed.   RECENT LABS AND TESTS: BMET    Component Value Date/Time   NA 143 11/17/2017 1230   K 4.3 11/17/2017 1230   CL 106 11/17/2017 1230   CO2 22 11/17/2017 1230   GLUCOSE 98 11/17/2017 1230   GLUCOSE 76 03/04/2017 1109   BUN 23 11/17/2017 1230   CREATININE 0.94  11/17/2017 1230   CALCIUM 9.2 11/17/2017 1230   GFRNONAA 61 11/17/2017 1230   GFRAA 71 11/17/2017 1230   Lab Results  Component Value Date   HGBA1C 5.6 11/17/2017   HGBA1C 5.4 10/08/2016   HGBA1C 5.8 (H) 10/13/2014   Lab  Results  Component Value Date   INSULIN 8.4 11/17/2017   INSULIN 8.2 02/15/2017   INSULIN 9.8 10/08/2016   CBC    Component Value Date/Time   WBC 4.2 03/04/2017 1109   RBC 4.09 03/04/2017 1109   HGB 12.7 03/04/2017 1109   HGB 13.0 02/15/2017 1207   HCT 39.0 03/04/2017 1109   HCT 40.0 02/15/2017 1207   PLT 183.0 03/04/2017 1109   MCV 95.3 03/04/2017 1109   MCV 94 02/15/2017 1207   MCH 30.4 02/15/2017 1207   MCH 31.7 05/10/2016 1304   MCHC 32.7 03/04/2017 1109   RDW 13.1 03/04/2017 1109   RDW 13.3 02/15/2017 1207   LYMPHSABS 1.4 03/04/2017 1109   LYMPHSABS 1.4 02/15/2017 1207   MONOABS 0.3 03/04/2017 1109   EOSABS 0.1 03/04/2017 1109   EOSABS 0.1 02/15/2017 1207   BASOSABS 0.0 03/04/2017 1109   BASOSABS 0.0 02/15/2017 1207   Iron/TIBC/Ferritin/ %Sat No results found for: IRON, TIBC, FERRITIN, IRONPCTSAT Lipid Panel     Component Value Date/Time   CHOL 190 11/17/2017 1230   TRIG 60 11/17/2017 1230   HDL 95 11/17/2017 1230   LDLCALC 83 11/17/2017 1230   Hepatic Function Panel     Component Value Date/Time   PROT 6.4 11/17/2017 1230   ALBUMIN 4.3 11/17/2017 1230   AST 38 11/17/2017 1230   ALT 20 11/17/2017 1230   ALKPHOS 91 11/17/2017 1230   BILITOT 0.3 11/17/2017 1230      Component Value Date/Time   TSH 1.60 09/30/2016   TSH 0.771 10/13/2014 1700   TSH 1.37 08/03/2013 1421   Results for SELITA, STAIGER (MRN [id number]) as of 01/03/2018 14:30  Ref. Range 11/17/2017 12:30  Vitamin D, 25-Hydroxy Latest Ref Range: 30.0 - 100.0 ng/mL 67.6   ASSESSMENT AND PLAN: Insulin resistance  Other depression - with emotional eating  Class 2 severe obesity with serious comorbidity and body mass index (BMI) of 36.0 to 36.9 in adult, unspecified obesity type ([place])  PLAN:  Insulin Resistance [name] will continue to work on weight loss, exercise, and decreasing simple carbohydrates in her diet to help decrease the risk of diabetes. She was informed that eating too  many simple carbohydrates or too many calories at one sitting increases the likelihood of GI side effects. She agrees to continue to decrease carbohydrates and increase protein. [name] agreed to follow up with [place] as directed to monitor her progress in 3 weeks.  Depression with Emotional Eating Behaviors We discussed behavior modification techniques today to help [name] deal with her emotional eating and depression. She has agreed to continue to take Topamax and bupropion and follow up as directed.  Obesity [name] is currently in the action stage of change. As such, her goal is to continue with weight loss efforts. She has agreed to follow the Category 2 plan + increase protein. She agrees to follow up with her GI Specialist and will try to increase protein. She is going to try to eat as tolerated until her GI symptoms resolve. [name] has been instructed to work up to a goal of 150 minutes of combined cardio and strengthening exercise per week for weight loss and overall health benefits. We discussed the following Behavioral Modification  Strategies today: increasing lean protein intake, decreasing simple carbohydrates,  increasing vegetables, increase H2O intake, no skipping meals, and better snacking choices.   [name] has agreed to follow up with our clinic in 3 weeks. She was informed of the importance of frequent follow up visits to maximize her success with intensive lifestyle modifications for her multiple health conditions.   OBESITY BEHAVIORAL INTERVENTION VISIT  Today's visit was # 20   Starting weight: 193 lbs Starting date: 10/08/16 Today's weight : Weight: 207 lb (93.9 kg)  Today's date: 01/02/2018 Total lbs lost to date: 0 At least 15 minutes were spent on discussing the following behavioral intervention visit.  ASK: We discussed the diagnosis of obesity with [name] today and [name] agreed to give [place] permission to discuss obesity behavioral modification therapy  today.  ASSESS: [name] has the diagnosis of obesity and her BMI today is 36.68. [name] is in the action stage of change.   ADVISE: [name] was educated on the multiple health risks of obesity as well as the benefit of weight loss to improve her health. She was advised of the need for long term treatment and the importance of lifestyle modifications to improve her current health and to decrease her risk of future health problems.  AGREE: Multiple dietary modification options and treatment options were discussed and [name] agreed to follow the recommendations documented in the above note.  ARRANGE: [name] was educated on the importance of frequent visits to treat obesity as outlined per CMS and USPSTF guidelines and agreed to schedule her next follow up appointment today.  I, Marcille Blanco, am acting as Location manager for [employer]. Owens Shark, DO  I have reviewed the above documentation for accuracy and completeness, and I agree with the above. -Jearld Lesch, DO

## 2018-01-11 ENCOUNTER — Other Ambulatory Visit (INDEPENDENT_AMBULATORY_CARE_PROVIDER_SITE_OTHER): Payer: Medicare Other

## 2018-01-11 DIAGNOSIS — R1011 Right upper quadrant pain: Secondary | ICD-10-CM

## 2018-01-11 DIAGNOSIS — Z23 Encounter for immunization: Secondary | ICD-10-CM | POA: Diagnosis not present

## 2018-01-11 LAB — BUN: BUN: 16 mg/dL (ref 6–23)

## 2018-01-11 LAB — CREATININE, SERUM: CREATININE: 0.94 mg/dL (ref 0.40–1.20)

## 2018-01-13 ENCOUNTER — Ambulatory Visit (INDEPENDENT_AMBULATORY_CARE_PROVIDER_SITE_OTHER)
Admission: RE | Admit: 2018-01-13 | Discharge: 2018-01-13 | Disposition: A | Discharge status: Home / Self Care | Payer: Medicare Other | Source: Ambulatory Visit | Attending: Gastroenterology | Admitting: Gastroenterology

## 2018-01-13 DIAGNOSIS — R1084 Generalized abdominal pain: Secondary | ICD-10-CM | POA: Diagnosis not present

## 2018-01-13 DIAGNOSIS — K5730 Diverticulosis of large intestine without perforation or abscess without bleeding: Secondary | ICD-10-CM | POA: Diagnosis not present

## 2018-01-13 MED ORDER — IOPAMIDOL (ISOVUE-300) INJECTION 61%
100.0000 mL | Freq: Once | INTRAVENOUS | Status: AC | PRN
  Administered 2018-01-13: 100 mL via INTRAVENOUS

## 2018-01-16 ENCOUNTER — Telehealth: Payer: Self-pay | Admitting: Gastroenterology

## 2018-01-16 NOTE — Telephone Encounter (Signed)
Thanks Julie

## 2018-01-16 NOTE — Telephone Encounter (Signed)
Spoke to patient, she did get her CT results. She decided to stop her carafate at the moment since results were normal, she has about 2 weeks worth of carafate left, let her know that if she needed refills to have her pharmacy call us. Currently, she is not having any "attack". I offered her the next available appointment on 11/25 with Dr. Havery Moros, patient scheduled for that appointment.  Advised her that if she has abdominal pain, she needs to contact the office. I let her know that we could get her worked in to see an APP but patient states that Dr. Havery Moros wanted to see her if she had another occurrence. Patient understands to contact office should the symptoms return. Discussed with patient importance of being seen or CT scan done at the time of the pain. [place], I let her know that if that ever happens again to go to the ED that is closest for evaluation.

## 2018-01-17 DIAGNOSIS — H5213 Myopia, bilateral: Secondary | ICD-10-CM | POA: Diagnosis not present

## 2018-01-17 DIAGNOSIS — H2513 Age-related nuclear cataract, bilateral: Secondary | ICD-10-CM | POA: Diagnosis not present

## 2018-01-20 ENCOUNTER — Encounter (INDEPENDENT_AMBULATORY_CARE_PROVIDER_SITE_OTHER): Payer: Self-pay | Admitting: Bariatrics

## 2018-01-23 ENCOUNTER — Encounter (INDEPENDENT_AMBULATORY_CARE_PROVIDER_SITE_OTHER): Payer: Self-pay

## 2018-01-23 ENCOUNTER — Ambulatory Visit (INDEPENDENT_AMBULATORY_CARE_PROVIDER_SITE_OTHER): Payer: Self-pay | Admitting: Bariatrics

## 2018-01-25 ENCOUNTER — Other Ambulatory Visit: Payer: Self-pay | Admitting: Cardiovascular Disease

## 2018-01-30 ENCOUNTER — Telehealth: Payer: Self-pay | Admitting: Gastroenterology

## 2018-01-30 NOTE — Telephone Encounter (Signed)
Please below message.

## 2018-01-30 NOTE — Telephone Encounter (Signed)
Spoke to patient, she states that she had worsening symptoms over the weekend, now a little better, but still having pain. She states has mucus in her stool. She has appointment with Dr. Havery Moros on 02/13/18. She also wanted refill on her Protonix 40 mg, she states that when she has these attacks, she was instructed to take two Protonix. Looking at her medication list, cardiologist sent in #90 refill 3, but patient is only able to get #30 at a time and is completely out. I have got her in to see JZ, who she has seen in the past, for tomorrow and have also kept her appointment on 02/13/18 with MD. Patient requesting refill on Protonix.

## 2018-01-30 NOTE — Telephone Encounter (Signed)
Thanks Almyra Free, we can refill protonix and agree with her appointment for tomorrow. I will discuss her case with JZ

## 2018-01-31 ENCOUNTER — Encounter: Payer: Self-pay | Admitting: Gastroenterology

## 2018-01-31 ENCOUNTER — Ambulatory Visit (INDEPENDENT_AMBULATORY_CARE_PROVIDER_SITE_OTHER): Payer: Medicare Other | Admitting: Gastroenterology

## 2018-01-31 ENCOUNTER — Other Ambulatory Visit: Payer: Self-pay

## 2018-01-31 ENCOUNTER — Other Ambulatory Visit: Payer: Medicare Other

## 2018-01-31 VITALS — BP 112/70 | HR 78 | Ht 63.75 in | Wt 214.1 lb

## 2018-01-31 DIAGNOSIS — K529 Noninfective gastroenteritis and colitis, unspecified: Secondary | ICD-10-CM | POA: Insufficient documentation

## 2018-01-31 DIAGNOSIS — R1084 Generalized abdominal pain: Secondary | ICD-10-CM | POA: Diagnosis not present

## 2018-01-31 DIAGNOSIS — K580 Irritable bowel syndrome with diarrhea: Secondary | ICD-10-CM

## 2018-01-31 DIAGNOSIS — R140 Abdominal distension (gaseous): Secondary | ICD-10-CM

## 2018-01-31 MED ORDER — SACCHAROMYCES BOULARDII 250 MG PO CAPS: 250.0000 mg | ORAL_CAPSULE | Freq: Two times a day (BID) | ORAL | 0 refills | Status: DC

## 2018-01-31 MED ORDER — PANTOPRAZOLE SODIUM 40 MG PO TBEC: 40.0000 mg | DELAYED_RELEASE_TABLET | Freq: Every morning | ORAL | 3 refills | Status: DC

## 2018-01-31 MED ORDER — PANTOPRAZOLE SODIUM 40 MG PO TBEC: 40.0000 mg | DELAYED_RELEASE_TABLET | Freq: Every day | ORAL | 5 refills | Status: DC

## 2018-01-31 MED ORDER — RIFAXIMIN 550 MG PO TABS: 550.0000 mg | ORAL_TABLET | Freq: Three times a day (TID) | ORAL | 0 refills | Status: AC

## 2018-01-31 MED ORDER — DICYCLOMINE HCL 10 MG PO CAPS: ORAL_CAPSULE | ORAL | 1 refills | Status: DC

## 2018-01-31 NOTE — Telephone Encounter (Signed)
Refill for protonix done.

## 2018-01-31 NOTE — Patient Instructions (Addendum)
Your provider has requested that you go to the basement level for stool studies before leaving today. Press "B" on the elevator. The lab is located at the first door on the left as you exit the elevator.  We changed the follow up appointment with Dr. Havery Moros for 02-27-2018 at 11:00 am.  You can take Imodium for diarrhea as needed.  We sent prescriptions to [facility].  1. Florastor 2. Pantoprazole sodium 40 mg 3. Bentyl ( dicyclomine ) 10 mg  We will sent the prescription for Xifaxan 550 mg to [facility]. They will be calling you.   Normal BMI (Body Mass Index- based on height and weight) is between 23 and 30. Your BMI today is Body mass index is 37.04 kg/m. [name] Please consider follow up  regarding your BMI with your Primary Care Provider.

## 2018-01-31 NOTE — Progress Notes (Signed)
01/31/2018 ATHENE SCHUHMACHER [id number] May 09, 1945   HISTORY OF PRESENT ILLNESS:  This is a 72 year old female who is a patient of Dr. Doyne Keel who was last seen here in 02/2017 just to have her colonoscopy.  Otherwise was seen in 04/2016.  Colonoscopy in 03/2017 showed only diverticulosis.  Random biopsies to rule out microscopic colitis were negative.  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  Gastric biopsies showed no pathologic changes, negative for Hpylori.  She presents to our office today with complaints of long-standing episodes of diarrhea, abdominal pain (generalized but sometimes localized on the right), and bloating.  Current symptoms present since September.  She says that on September 15 she was on vacation and ate a salad.  Subsequently she developed diarrhea and pain that had her doubled over.  Since that time she has been struggling with her bowels.  Pain comes and goes sometimes lasting a few days at a time.  She gets urgency with her diarrhea.  She says she cannot identify any specific foods that trigger her symptoms.  She has tried several different diets including gluten-free, lactose-free, etc.  She has been having episodes of incontinence and sometimes with the diarrhea she has a lot of mucus.  Denies any rectal bleeding.  Wanting to know what her diagnosis is and what to do about it/how to control it.  She had a CT scan of the abdomen and pelvis with contrast on October 25 that only showed colonic diverticulosis without radiographic evidence of diverticulitis.   Past Medical History:  Diagnosis Date  . Allergy   . Anemia   . Antral polyp    benign  . Anxiety   . Arthritis   . Asthma   . B12 deficiency   . Back pain   . Chest pain   . Constipation   . Depression   . Diverticulosis   . Gallbladder problem   . GERD (gastroesophageal reflux disease) 09/09/2011  . History of transfusion   . HTN  (hypertension) 09/09/2011  . Hyperlipemia 09/09/2011  . Hypertension   . IBS (irritable bowel syndrome)   . Internal hemorrhoid   . Joint pain   . Obesity   . Osteoarthritis   . Paroxysmal atrial fibrillation (HCC)   . PONV (postoperative nausea and vomiting)    with big surgeries, none with endos etc, with colonoscopy- 5-6 years ago could not swallow   . Shortness of breath   . Sleep apnea    borderline per patient no cpap   . SOB (shortness of breath) on exertion   . Swallowing difficulty   . Tachycardia   . Unstable angina ([place]) 09/09/2011  . Vitamin D deficiency   . Vocal cord dysfunction 2013   swelling   Past Surgical History:  Procedure Laterality Date  . ABDOMINAL HYSTERECTOMY  1988  . ANKLE FUSION Right    x 2  . BACK SURGERY  [id number]   3 ruptured discs, lower back  . BILATERAL ANTERIOR TOTAL HIP ARTHROPLASTY Bilateral 11/12/2014   Procedure: BILATERAL ANTERIOR TOTAL HIP ARTHROPLASTY;  Surgeon: Paralee Cancel, MD;  Location: [facility];  Service: Orthopedics;  Laterality: Bilateral;  . BRAVO [place] STUDY [name] 03/06/2013   Procedure: BRAVO [place] STUDY;  Surgeon: Inda Castle, MD;  [facility]: [facility];  Service: Endoscopy;  Laterality: [name];  . CARDIAC CATHETERIZATION  2013   NORMAL  . CHOLECYSTECTOMY    .  COLONOSCOPY    . ELBOW SURGERY Left    removed bone chip  . ESOPHAGOGASTRODUODENOSCOPY [name] 03/06/2013   Procedure: ESOPHAGOGASTRODUODENOSCOPY (EGD);  Surgeon: Inda Castle, MD;  Location: [facility];  Service: Endoscopy;  Laterality: [name];  . FOOT OSTEOTOMY W/ PLANTAR FASCIA RELEASE Left   . ILIOTIBIAL BAND RELEASE Right 1997  . JOINT REPLACEMENT    . LAPAROSCOPIC GASTRIC SLEEVE RESECTION WITH HIATAL HERNIA REPAIR  08/20/2014   Procedure: LAPAROSCOPIC GASTRIC SLEEVE RESECTION WITH HIATAL HERNIA  REPAIR AND  UPPER ENDOSCOPY;  Surgeon: Johnathan Hausen, MD;  [facility]: [facility];  Service: General;;  . LEFT HEART CATHETERIZATION WITH CORONARY ANGIOGRAM Bilateral 09/10/2011     Procedure: LEFT HEART CATHETERIZATION WITH CORONARY ANGIOGRAM;  Surgeon: Lorretta Harp, MD;  Location: [facility];  Service: Cardiovascular;  Laterality: Bilateral;  . NASAL SINUS SURGERY  1997  . REVERSE SHOULDER ARTHROPLASTY Right 05/14/2016   Procedure: RIGHT REVERSE SHOULDER ARTHROPLASTY;  Surgeon: Netta Cedars, MD;  Location: [place];  Service: Orthopedics;  Laterality: Right;  . SHOULDER SURGERY Left   . tfc wrist Left 1997  . thumb replacement Bilateral    one 2009 and one 2014  . TOTAL ANKLE REPLACEMENT Right    x 3  . TOTAL ANKLE REPLACEMENT Left   . TOTAL ANKLE REPLACEMENT    . TOTAL HIP ARTHROPLASTY Bilateral   . TOTAL KNEE ARTHROPLASTY Bilateral    left 2002, right 2012  . TOTAL SHOULDER ARTHROPLASTY Right 05/14/2016  . TRANSTHORACIC ECHOCARDIOGRAM  09/09/2011   MILD CONCENTRIC LVH. TRACE MITRAL REGURG. TRACE TR. MILD AORTIC REGURG.  . 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 UPPER GI ENDOSCOPY  08/20/2014   Procedure: UPPER GI ENDOSCOPY;  Surgeon: Johnathan Hausen, MD;  [facility];  Service: General;;  . WRIST SURGERY Left 1997, 2009     reports that she has never smoked. She has never used smokeless tobacco. She reports that she does not drink alcohol or use drugs. family history includes Alcoholism in her father; Arthritis in her mother; Cancer in her father and mother; Diabetes in her mother; Esophageal cancer in her mother; Esophageal cancer (age of onset: 30) in her father; Heart disease in her maternal grandmother; Hypertension in her mother and sister; Other in her sister; Stroke in her paternal grandmother. Allergies  Allergen Reactions  . Ativan [Lorazepam] Other (See Comments)    Agitation, aggressive actions, significant mental changes  . Buprenorphine Hcl Itching and Rash  . Fentanyl Itching  . Morphine And Related Itching and Rash      Outpatient Encounter Medications as of 01/31/2018  Medication Sig  . acetaminophen (TYLENOL) 500 MG tablet  Take 1,000 mg by mouth every 6 (six) hours as needed (for pain.).   [name] albuterol (PROVENTIL HFA;VENTOLIN HFA) 108 (90 Base) MCG/ACT inhaler Inhale 1-2 puffs into the lungs every 6 (six) hours as needed for shortness of breath or wheezing.  . Biotin 10000 MCG TABS Take 10,000 mcg by mouth at bedtime.   . cetirizine (ZYRTEC) 10 MG tablet Take 10 mg by mouth daily.   . cholecalciferol (VITAMIN D) 1000 units tablet Take 1 tablet (1,000 Units total) by mouth 2 (two) times daily.  . clorazepate (TRANXENE) 7.5 MG tablet Take 7.5 mg by mouth 3 (three) times daily as needed for anxiety.  . DULoxetine (CYMBALTA) 60 MG capsule Take 60 mg by mouth daily with supper.   . Methylcellulose, Laxative, (FIBER THERAPY PO) Take by mouth daily.  [name]  Multiple Vitamin (MULTIVITAMIN WITH MINERALS) TABS tablet Take 1 tablet by mouth 2 (two) times daily. Centrum  . pantoprazole (PROTONIX) 40 MG tablet Take 1 tablet (40 mg total) by mouth every morning. (Patient taking differently: Take 40 mg by mouth 2 (two) times daily. )  . Probiotic Product (RESTORA) CAPS Take 1 capsule by mouth daily. (Patient taking differently: Take 1 capsule by mouth daily with supper. )  . simvastatin (ZOCOR) 20 MG tablet Take 1 tablet (20 mg total) by mouth daily.  . sucralfate (CARAFATE) 1 g tablet Take 1 tablet (1 g total) by mouth 4 (four) times daily -  with meals and at bedtime. May dissolve tablet in 1 tablespoon of water to take as a slurry  . topiramate (TOPAMAX) 25 MG tablet Take 1 tablet (25 mg total) by mouth 2 (two) times daily.  . traMADol (ULTRAM) 50 MG tablet Take 50 mg by mouth every 6 (six) hours as needed (for pain.).   [name] vitamin B-12 (CYANOCOBALAMIN) 1000 MCG tablet Take 1,000 mcg by mouth daily.  . [DISCONTINUED] buPROPion (WELLBUTRIN SR) 200 MG 12 hr tablet Take 1 tablet (200 mg total) by mouth daily at 12 noon.  . [DISCONTINUED] diltiazem (CARTIA XT) 180 MG 24 hr capsule Take 1 capsule (180 mg total) by mouth daily.  .  [DISCONTINUED] Methylcellulose, Laxative, 500 MG TABS Take 1 tablet by mouth daily.   . [DISCONTINUED] Multiple Vitamins-Minerals (CENTRUM SILVER 50+WOMEN PO) Take by mouth daily.  . [DISCONTINUED] 0.9 %  sodium chloride infusion   . [DISCONTINUED] 0.9 %  sodium chloride infusion    No facility-administered encounter medications on file as of 01/31/2018.      REVIEW OF SYSTEMS  : All other systems reviewed and negative except where noted in the History of Present Illness.   PHYSICAL EXAM: BP 112/70   Pulse 78   Ht 5' 3.75" (1.619 m)   Wt 214 lb 2 oz (97.1 kg)   BMI 37.04 kg/m  General: Well developed white female in no acute distress Head: Normocephalic and atraumatic Eyes:  Sclerae anicteric, conjunctiva pink. Ears: Normal auditory acuity Lungs: Clear throughout to auscultation; no increased WOB. Heart: Regular rate and rhythm; no M/R/G. Abdomen: Soft, non-distended.  BS present.  Non-tender. Musculoskeletal: Symmetrical with no gross deformities  Skin: No lesions on visible extremities Extremities: No edema  Neurological: Alert oriented x 4, grossly non-focal Psychological:  Alert and cooperative. Normal mood and affect  ASSESSMENT AND PLAN: *72 year old female with complaints of long-standing episodes of diarrhea, abdominal pain (generalized but sometimes localized on the right), and bloating.  Current symptoms present since September.  Wanting to know what her diagnosis is and what to do about it/how to control it.  I think that her long-standing issue is IBS-D.  Her current symptoms likely represent a flare of her IBS, but will rule out infectious source with GI pathogen panel and stool for Cdiff.  Will also check pancreatic elastase (she mentioned about her pancreas) although I think an issue there is unlikely.  Pending normal stool results.  Will try Xifaxan 500 mg TID for 2 weeks.  Then will follow with Florastor probiotic.  Will give Bentyl to use prn for her abdominal pain  as well. *GERD:  Well controlled overall on pantoprazole 40 mg daily.  Will refill this for her.     CC:  [name], MD

## 2018-02-01 ENCOUNTER — Other Ambulatory Visit: Payer: Medicare Other

## 2018-02-01 DIAGNOSIS — R1084 Generalized abdominal pain: Secondary | ICD-10-CM | POA: Diagnosis not present

## 2018-02-01 DIAGNOSIS — K580 Irritable bowel syndrome with diarrhea: Secondary | ICD-10-CM | POA: Diagnosis not present

## 2018-02-01 DIAGNOSIS — K529 Noninfective gastroenteritis and colitis, unspecified: Secondary | ICD-10-CM | POA: Diagnosis not present

## 2018-02-01 DIAGNOSIS — R140 Abdominal distension (gaseous): Secondary | ICD-10-CM | POA: Diagnosis not present

## 2018-02-02 ENCOUNTER — Telehealth: Payer: Self-pay | Admitting: Gastroenterology

## 2018-02-02 NOTE — Progress Notes (Signed)
Agree with assessment and plan as outlined.  

## 2018-02-02 NOTE — Telephone Encounter (Signed)
Faxed the office note from Alonza Bogus PA from 01-31-2018 to [facility].

## 2018-02-06 ENCOUNTER — Telehealth: Payer: Self-pay | Admitting: Gastroenterology

## 2018-02-06 NOTE — Telephone Encounter (Signed)
Pt states that company that was supposed to provide antibiotic has not contacted her yet. Also she said that she received a message in [place] regarding appt with lab that she no showed on 02/01/18, she is confused because she said that she dropped stool specimen so she is not sure if she needed other labs, she stated not to have an idea about appt on 02/01/18. Pls call her.

## 2018-02-06 NOTE — Telephone Encounter (Signed)
Last seen by Lenise Arena please route to [facility].

## 2018-02-06 NOTE — Telephone Encounter (Signed)
Pam do you know anything about the pt's xifaxan?

## 2018-02-06 NOTE — Telephone Encounter (Signed)
I faxed [name]'s office note to [facility] per their request on 02-02-2018. I have not heard from them yet.  I just called and they are working on the prior authorization.  They are to notify me.

## 2018-02-08 LAB — GASTROINTESTINAL PATHOGEN PANEL PCR
C. DIFFICILE TOX A/B, PCR: NOT DETECTED
CAMPYLOBACTER, PCR: NOT DETECTED
CRYPTOSPORIDIUM, PCR: NOT DETECTED
E COLI 0157, PCR: NOT DETECTED
E coli (ETEC) LT/ST PCR: NOT DETECTED
E coli (STEC) stx1/stx2, PCR: NOT DETECTED
GIARDIA LAMBLIA, PCR: NOT DETECTED
Norovirus, PCR: NOT DETECTED
ROTAVIRUS, PCR: NOT DETECTED
SALMONELLA, PCR: NOT DETECTED
Shigella, PCR: NOT DETECTED

## 2018-02-08 LAB — CLOSTRIDIUM DIFFICILE TOXIN B, QUALITATIVE, REAL-TIME PCR: CDIFFPCR: NOT DETECTED

## 2018-02-08 LAB — PANCREATIC ELASTASE, FECAL: Pancreatic Elastase-1, Stool: 260 mcg/g

## 2018-02-09 ENCOUNTER — Telehealth: Payer: Self-pay | Admitting: Gastroenterology

## 2018-02-09 NOTE — Telephone Encounter (Signed)
I see you were working on this Pam.

## 2018-02-09 NOTE — Telephone Encounter (Signed)
Advised the patient that I will try to get her samples for the 42 tablets.  She is going out of town Tuesday 02-14-2018 at noon.

## 2018-02-10 ENCOUNTER — Other Ambulatory Visit: Payer: Self-pay | Admitting: *Deleted

## 2018-02-10 MED ORDER — METRONIDAZOLE 250 MG PO TABS: ORAL_TABLET | ORAL | 0 refills | Status: DC

## 2018-02-10 NOTE — Telephone Encounter (Signed)
LM for the patient that we cannot get Xifaxan samples until at least the middle of December. [name] thought she should take Flagyl 250 mg, she is to take 1 tablet 3 times daily for 7 days.  No alcohol while taking the medication and 3 days after. I sent the prescription to [facility].  Advised the patient to call me if she has any questions.

## 2018-02-13 ENCOUNTER — Ambulatory Visit: Payer: Self-pay | Admitting: Gastroenterology

## 2018-02-27 ENCOUNTER — Ambulatory Visit (INDEPENDENT_AMBULATORY_CARE_PROVIDER_SITE_OTHER): Payer: Medicare Other | Admitting: Gastroenterology

## 2018-02-27 ENCOUNTER — Encounter: Payer: Self-pay | Admitting: Gastroenterology

## 2018-02-27 VITALS — BP 130/84 | HR 83 | Ht 63.5 in | Wt 218.5 lb

## 2018-02-27 DIAGNOSIS — K529 Noninfective gastroenteritis and colitis, unspecified: Secondary | ICD-10-CM | POA: Diagnosis not present

## 2018-02-27 DIAGNOSIS — R109 Unspecified abdominal pain: Secondary | ICD-10-CM | POA: Diagnosis not present

## 2018-02-27 NOTE — Progress Notes (Signed)
HPI :  72 year old female here for follow-up visit for abdominal pain and bowel changes.  She's been having episodic right upper quadrant pain associated with loose stools. These episodes of pain have been severe, double sore over and she has a hard time functioning. When this happens she has pain that lasts upwards of a week at time she has associated loose watery stools. During the time for pain, bowel movements do not reliably relieve her symptoms. She cannot think of any other clear triggers. She does have some baseline loose stools and has been using Imodium once daily which has provided a lot of benefit with that since her last visit with [place]. She has an average one bowel movement a day on that regimen. She also feels nauseated but does not have any vomiting during an episode. At baseline she does not have much of any abdominal pain. She's had a prior workup for this to include EGD and colonoscopy in January as outlined below, no pathology noted. She had biopsies that were negative for microscopic colitis. She's had a CT scan in October of this year as well as in 2018 which did not show any cause for pain. She's had negative stool testing for infection and pancreatic elastase.  On average she will have an episode every 2-3 months which is severe. She was seen by Alonza Bogus in November. She was prescribed a trial of rifaximin for possible IBS which he could not afford. She was given a course of Flagyl instead and provided Bentyl to use as needed. 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 It has aborted the onset of symptoms within minutes when she has started to have an episode. She's not had any severe episodes since doing this. She has stopped all probiotics. Her bowels have been regular while taking Imodium once a day. She did not think the Flagyl changed much. She is not having any blood in the stools. Overall she is pretty happy with how she is feeling at this point in recent  weeks.   CT scan 01/13/2018 - no acute findings  EGD 04/15/2017 - sleeve gastrectomy, otherwise normal exam - biopsies taken to rule out H pylori -  Colonoscopy 04/15/2017 - normal TI, multiple diverticuli, normal exam otherwise - biopsies taken to rule out microscopic colitis   Past Medical History:  Diagnosis Date  . Allergy   . Anemia   . Antral polyp    benign  . Anxiety   . Arthritis   . Asthma   . B12 deficiency   . Back pain   . Chest pain   . Constipation   . Depression   . Diverticulosis   . Gallbladder problem   . GERD (gastroesophageal reflux disease) 09/09/2011  . History of transfusion   . HTN (hypertension) 09/09/2011  . Hyperlipemia 09/09/2011  . Hypertension   . IBS (irritable bowel syndrome)   . Internal hemorrhoid   . Joint pain   . Obesity   . Osteoarthritis   . Paroxysmal atrial fibrillation (HCC)   . PONV (postoperative nausea and vomiting)    with big surgeries, none with endos etc, with colonoscopy- 5-6 years ago could not swallow   . Shortness of breath   . Sleep apnea    borderline per patient no cpap   . SOB (shortness of breath) on exertion   . Swallowing difficulty   . Tachycardia   . Unstable angina ([place]) 09/09/2011  . Vitamin D deficiency   .  Vocal cord dysfunction 2013   swelling     Past Surgical History:  Procedure Laterality Date  . ABDOMINAL HYSTERECTOMY  1988  . ANKLE FUSION Right    x 2  . BACK SURGERY  [phone]   3 ruptured discs, lower back  . BILATERAL ANTERIOR TOTAL HIP ARTHROPLASTY Bilateral 11/12/2014   Procedure: BILATERAL ANTERIOR TOTAL HIP ARTHROPLASTY;  Surgeon: Paralee Cancel, MD;  [facility];  Service: Orthopedics;  Laterality: Bilateral;  . BRAVO [place] STUDY [name] 03/06/2013   Procedure: BRAVO [place] STUDY;  Surgeon: Inda Castle, MD;  [facility];  Service: Endoscopy;  Laterality: [name];  . CARDIAC CATHETERIZATION  2013   NORMAL  . CHOLECYSTECTOMY    . COLONOSCOPY    . ELBOW SURGERY Left     removed bone chip  . ESOPHAGOGASTRODUODENOSCOPY [name] 03/06/2013   Procedure: ESOPHAGOGASTRODUODENOSCOPY (EGD);  Surgeon: Inda Castle, MD;  Location: [facility];  Service: Endoscopy;  Laterality: [name];  . FOOT OSTEOTOMY W/ PLANTAR FASCIA RELEASE Left   . ILIOTIBIAL BAND RELEASE Right 1997  . JOINT REPLACEMENT    . LAPAROSCOPIC GASTRIC SLEEVE RESECTION WITH HIATAL HERNIA REPAIR  08/20/2014   Procedure: LAPAROSCOPIC GASTRIC SLEEVE RESECTION WITH HIATAL HERNIA  REPAIR AND  UPPER ENDOSCOPY;  Surgeon: Johnathan Hausen, MD;  Location: [facility];  Service: General;;  . LEFT HEART CATHETERIZATION WITH CORONARY ANGIOGRAM Bilateral 09/10/2011   Procedure: LEFT HEART CATHETERIZATION WITH CORONARY ANGIOGRAM;  Surgeon: Lorretta Harp, MD;  Location: [facility] C[facility] LAB;  Service: Cardiovascular;  Laterality: Bilateral;  . NASAL SINUS SURGERY  1997  . REVERSE SHOULDER ARTHROPLASTY Right 05/14/2016   Procedure: RIGHT REVERSE SHOULDER ARTHROPLASTY;  Surgeon: Netta Cedars, MD;  Location: [place];  Service: Orthopedics;  Laterality: Right;  . SHOULDER SURGERY Left   . tfc wrist Left 1997  . thumb replacement Bilateral    one 2009 and one 2014  . TOTAL ANKLE REPLACEMENT Right    x 3  . TOTAL ANKLE REPLACEMENT Left   . TOTAL ANKLE REPLACEMENT    . TOTAL HIP ARTHROPLASTY Bilateral   . TOTAL KNEE ARTHROPLASTY Bilateral    left 2002, right 2012  . TOTAL SHOULDER ARTHROPLASTY Right 05/14/2016  . TRANSTHORACIC ECHOCARDIOGRAM  09/09/2011   MILD CONCENTRIC LVH. TRACE MITRAL REGURG. TRACE TR. MILD AORTIC REGURG.  . 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 UPPER GI ENDOSCOPY  08/20/2014   Procedure: UPPER GI ENDOSCOPY;  Surgeon: Johnathan Hausen, MD;  [facility];  Service: General;;  . WRIST SURGERY Left 1997, 2009    Family History  Problem Relation Age of Onset  . Hypertension Mother   . Diabetes Mother   . Arthritis Mother   . Esophageal cancer Mother   . Cancer Mother   . Esophageal cancer Father 52  .  Cancer Father   . Alcoholism Father   . Other Sister        kidney mass removed  . Hypertension Sister   . Heart disease Maternal Grandmother   . Stroke Paternal Grandmother   . Coronary artery disease Neg Hx   . Colon cancer Neg Hx   . Liver disease Neg Hx   . Kidney disease Neg Hx   . Rectal cancer Neg Hx   . Stomach cancer Neg Hx    Social History   Tobacco Use  . Smoking status: Never Smoker  . Smokeless tobacco: Never Used  Substance Use Topics  . Alcohol use: No  .  Drug use: No   Current Outpatient Medications  Medication Sig Dispense Refill  . acetaminophen (TYLENOL) 500 MG tablet Take 1,000 mg by mouth every 6 (six) hours as needed (for pain.).     [name] albuterol (PROVENTIL HFA;VENTOLIN HFA) 108 (90 Base) MCG/ACT inhaler Inhale 1-2 puffs into the lungs every 6 (six) hours as needed for shortness of breath or wheezing.    [name] ALPRAZolam (XANAX) 0.25 MG tablet Take 0.125 mg by mouth as needed for anxiety.    . Biotin 10000 MCG TABS Take 10,000 mcg by mouth at bedtime.     . cetirizine (ZYRTEC) 10 MG tablet Take 10 mg by mouth daily.     . cholecalciferol (VITAMIN D) 1000 units tablet Take 1 tablet (1,000 Units total) by mouth 2 (two) times daily. 60 tablet 0  . dicyclomine (BENTYL) 10 MG capsule Take 1 tab twice daily as needed. 60 capsule 1  . DULoxetine (CYMBALTA) 60 MG capsule Take 60 mg by mouth daily with supper.     . Loperamide HCl (IMODIUM PO) Take 1 tablet by mouth daily.    . Multiple Vitamin (MULTIVITAMIN WITH MINERALS) TABS tablet Take 1 tablet by mouth daily. Centrum     . pantoprazole (PROTONIX) 40 MG tablet Take 1 tablet (40 mg total) by mouth daily. 30 tablet 5  . simvastatin (ZOCOR) 20 MG tablet Take 1 tablet (20 mg total) by mouth daily. 30 tablet 11  . vitamin B-12 (CYANOCOBALAMIN) 1000 MCG tablet Take 1,000 mcg by mouth daily.    . Probiotic Product (ALIGN PO) Take 1 tablet by mouth as needed.    . traMADol (ULTRAM) 50 MG tablet Take 50 mg by mouth every 6  (six) hours as needed (for pain.).      No current facility-administered medications for this visit.    Allergies  Allergen Reactions  . Ativan [Lorazepam] Other (See Comments)    Agitation, aggressive actions, significant mental changes  . Buprenorphine Hcl Itching and Rash  . Fentanyl Itching  . Morphine And Related Itching and Rash     Review of Systems: All systems reviewed and negative except where noted in HPI.   Lab Results  Component Value Date   WBC 4.2 03/04/2017   HGB 12.7 03/04/2017   HCT 39.0 03/04/2017   MCV 95.3 03/04/2017   PLT 183.0 03/04/2017    Lab Results  Component Value Date   CREATININE 0.94 01/11/2018   BUN 16 01/11/2018   NA 143 11/17/2017   K 4.3 11/17/2017   CL 106 11/17/2017   CO2 22 11/17/2017    Lab Results  Component Value Date   ALT 20 11/17/2017   AST 38 11/17/2017   ALKPHOS 91 11/17/2017   BILITOT 0.3 11/17/2017     Physical Exam: BP 130/84   Pulse 83   Ht 5' 3.5" (1.613 m)   Wt 218 lb 8 oz (99.1 kg)   BMI 38.10 kg/m  Constitutional: Pleasant,well-developed, female in no acute distress. HEENT: Normocephalic and atraumatic. Conjunctivae are normal. No scleral icterus. Neck supple.  Cardiovascular: Normal rate, regular rhythm.  Pulmonary/chest: Effort normal and breath sounds normal. No wheezing, rales or rhonchi. Abdominal: Soft, nondistended, mild tenderness in RLQ - no peritoneal / rebound. There are no masses palpable. No hepatomegaly. Extremities: no edema Lymphadenopathy: No cervical adenopathy noted. Neurological: Alert and oriented to person place and time. Skin: Skin is warm and dry. No rashes noted. Psychiatric: Normal mood and affect. Behavior is normal.   ASSESSMENT AND PLAN:  72 year old female here for reassessment following issues:  Abdominal pain / chronic diarrhea - as above, intermittent severe episodes of abdominal pain associated with worsening loose stools. During this time her bowel movements do  not change her pain at all and pain lasts upwards of a week at a time. She does have some chronic loose stools at baseline which is otherwise mild in between episodes. As an EGD and colonoscopy as well as 2 CT scans which have not shown any clear pathology to account for her pain. Since she was last seen she was given a trial of Bentyl and that appears to have significantly helped her, as aborted symptoms on at least 3 occasions within minutes. Otherwise on Imodium once daily which has kept her stools normalized. While she doesn't meet criteria for IBS, symptoms appear to be related to bowel spasm / functional change. We discussed this for a bit. I reassured her about her CT scans and labs. She will keep the Bentyl on her and use it as needed upwards of 20 mg every 8 hours as needed should she have recurrence of pain. If her stools are normalized on Imodium low-dose daily I would continue that. I think she should hold off on taking probiotics as these have not provided benefit for her in the past. If her diarrhea becomes problematic in the future, would consider Colestid. She can follow-up as needed for these issues. All questions answered.  [place] Cellar, MD [facility]

## 2018-03-30 DIAGNOSIS — L821 Other seborrheic keratosis: Secondary | ICD-10-CM | POA: Diagnosis not present

## 2018-03-30 DIAGNOSIS — C44729 Squamous cell carcinoma of skin of left lower limb, including hip: Secondary | ICD-10-CM | POA: Diagnosis not present

## 2018-03-30 DIAGNOSIS — Z85828 Personal history of other malignant neoplasm of skin: Secondary | ICD-10-CM | POA: Diagnosis not present

## 2018-03-30 DIAGNOSIS — L820 Inflamed seborrheic keratosis: Secondary | ICD-10-CM | POA: Diagnosis not present

## 2018-04-04 DIAGNOSIS — Z1231 Encounter for screening mammogram for malignant neoplasm of breast: Secondary | ICD-10-CM | POA: Diagnosis not present

## 2018-04-14 DIAGNOSIS — I10 Essential (primary) hypertension: Secondary | ICD-10-CM | POA: Diagnosis not present

## 2018-04-14 DIAGNOSIS — E7849 Other hyperlipidemia: Secondary | ICD-10-CM | POA: Diagnosis not present

## 2018-04-14 DIAGNOSIS — M859 Disorder of bone density and structure, unspecified: Secondary | ICD-10-CM | POA: Diagnosis not present

## 2018-04-14 DIAGNOSIS — R7301 Impaired fasting glucose: Secondary | ICD-10-CM | POA: Diagnosis not present

## 2018-04-14 DIAGNOSIS — R82998 Other abnormal findings in urine: Secondary | ICD-10-CM | POA: Diagnosis not present

## 2018-04-20 DIAGNOSIS — M65311 Trigger thumb, right thumb: Secondary | ICD-10-CM | POA: Diagnosis not present

## 2018-04-20 DIAGNOSIS — M65312 Trigger thumb, left thumb: Secondary | ICD-10-CM | POA: Diagnosis not present

## 2018-04-21 DIAGNOSIS — Z0000 Encounter for general adult medical examination without abnormal findings: Secondary | ICD-10-CM | POA: Diagnosis not present

## 2018-04-21 DIAGNOSIS — Z1331 Encounter for screening for depression: Secondary | ICD-10-CM | POA: Diagnosis not present

## 2018-04-21 DIAGNOSIS — R7301 Impaired fasting glucose: Secondary | ICD-10-CM | POA: Diagnosis not present

## 2018-04-21 DIAGNOSIS — E669 Obesity, unspecified: Secondary | ICD-10-CM | POA: Diagnosis not present

## 2018-04-21 DIAGNOSIS — I10 Essential (primary) hypertension: Secondary | ICD-10-CM | POA: Diagnosis not present

## 2018-04-21 DIAGNOSIS — J45909 Unspecified asthma, uncomplicated: Secondary | ICD-10-CM | POA: Diagnosis not present

## 2018-04-21 DIAGNOSIS — E7849 Other hyperlipidemia: Secondary | ICD-10-CM | POA: Diagnosis not present

## 2018-04-21 DIAGNOSIS — H532 Diplopia: Secondary | ICD-10-CM | POA: Diagnosis not present

## 2018-04-21 DIAGNOSIS — M859 Disorder of bone density and structure, unspecified: Secondary | ICD-10-CM | POA: Diagnosis not present

## 2018-04-21 DIAGNOSIS — Z8679 Personal history of other diseases of the circulatory system: Secondary | ICD-10-CM | POA: Diagnosis not present

## 2018-04-21 DIAGNOSIS — H02402 Unspecified ptosis of left eyelid: Secondary | ICD-10-CM | POA: Diagnosis not present

## 2018-04-26 DIAGNOSIS — Z9884 Bariatric surgery status: Secondary | ICD-10-CM | POA: Diagnosis not present

## 2018-04-26 DIAGNOSIS — Z903 Acquired absence of stomach [part of]: Secondary | ICD-10-CM | POA: Diagnosis not present

## 2018-05-25 DIAGNOSIS — Z96661 Presence of right artificial ankle joint: Secondary | ICD-10-CM | POA: Diagnosis not present

## 2018-05-25 DIAGNOSIS — M85871 Other specified disorders of bone density and structure, right ankle and foot: Secondary | ICD-10-CM | POA: Diagnosis not present

## 2018-05-25 DIAGNOSIS — M25571 Pain in right ankle and joints of right foot: Secondary | ICD-10-CM | POA: Diagnosis not present

## 2018-05-25 DIAGNOSIS — M19071 Primary osteoarthritis, right ankle and foot: Secondary | ICD-10-CM | POA: Diagnosis not present

## 2018-05-29 DIAGNOSIS — L738 Other specified follicular disorders: Secondary | ICD-10-CM | POA: Diagnosis not present

## 2018-05-29 DIAGNOSIS — L820 Inflamed seborrheic keratosis: Secondary | ICD-10-CM | POA: Diagnosis not present

## 2018-05-29 DIAGNOSIS — Z85828 Personal history of other malignant neoplasm of skin: Secondary | ICD-10-CM | POA: Diagnosis not present

## 2018-05-29 DIAGNOSIS — D2372 Other benign neoplasm of skin of left lower limb, including hip: Secondary | ICD-10-CM | POA: Diagnosis not present

## 2018-05-29 DIAGNOSIS — D485 Neoplasm of uncertain behavior of skin: Secondary | ICD-10-CM | POA: Diagnosis not present

## 2018-08-29 ENCOUNTER — Telehealth: Payer: Self-pay | Admitting: *Deleted

## 2018-08-29 NOTE — Telephone Encounter (Signed)
A message left,re: follow up visit. 

## 2018-09-01 DIAGNOSIS — H532 Diplopia: Secondary | ICD-10-CM | POA: Diagnosis not present

## 2018-09-01 DIAGNOSIS — H2513 Age-related nuclear cataract, bilateral: Secondary | ICD-10-CM | POA: Diagnosis not present

## 2018-09-05 DIAGNOSIS — Z96611 Presence of right artificial shoulder joint: Secondary | ICD-10-CM | POA: Diagnosis not present

## 2018-09-12 DIAGNOSIS — M65312 Trigger thumb, left thumb: Secondary | ICD-10-CM | POA: Diagnosis not present

## 2018-09-12 DIAGNOSIS — M65321 Trigger finger, right index finger: Secondary | ICD-10-CM | POA: Diagnosis not present

## 2018-09-12 DIAGNOSIS — M65311 Trigger thumb, right thumb: Secondary | ICD-10-CM | POA: Diagnosis not present

## 2018-09-15 DIAGNOSIS — H532 Diplopia: Secondary | ICD-10-CM | POA: Diagnosis not present

## 2018-09-15 DIAGNOSIS — H4910 Fourth [trochlear] nerve palsy, unspecified eye: Secondary | ICD-10-CM | POA: Diagnosis not present

## 2018-10-02 DIAGNOSIS — M65311 Trigger thumb, right thumb: Secondary | ICD-10-CM | POA: Diagnosis not present

## 2018-10-02 DIAGNOSIS — M65321 Trigger finger, right index finger: Secondary | ICD-10-CM | POA: Diagnosis not present

## 2018-10-10 DIAGNOSIS — H5032 Intermittent alternating esotropia: Secondary | ICD-10-CM | POA: Diagnosis not present

## 2018-10-10 DIAGNOSIS — H5022 Vertical strabismus, left eye: Secondary | ICD-10-CM | POA: Diagnosis not present

## 2018-11-30 DIAGNOSIS — M19041 Primary osteoarthritis, right hand: Secondary | ICD-10-CM | POA: Diagnosis not present

## 2018-12-25 ENCOUNTER — Other Ambulatory Visit: Payer: Self-pay | Admitting: Cardiovascular Disease

## 2019-01-02 DIAGNOSIS — D225 Melanocytic nevi of trunk: Secondary | ICD-10-CM | POA: Diagnosis not present

## 2019-01-02 DIAGNOSIS — D1801 Hemangioma of skin and subcutaneous tissue: Secondary | ICD-10-CM | POA: Diagnosis not present

## 2019-01-02 DIAGNOSIS — L821 Other seborrheic keratosis: Secondary | ICD-10-CM | POA: Diagnosis not present

## 2019-01-02 DIAGNOSIS — L430 Hypertrophic lichen planus: Secondary | ICD-10-CM | POA: Diagnosis not present

## 2019-01-02 DIAGNOSIS — D485 Neoplasm of uncertain behavior of skin: Secondary | ICD-10-CM | POA: Diagnosis not present

## 2019-01-02 DIAGNOSIS — Z85828 Personal history of other malignant neoplasm of skin: Secondary | ICD-10-CM | POA: Diagnosis not present

## 2019-01-02 DIAGNOSIS — L820 Inflamed seborrheic keratosis: Secondary | ICD-10-CM | POA: Diagnosis not present

## 2019-01-02 DIAGNOSIS — L570 Actinic keratosis: Secondary | ICD-10-CM | POA: Diagnosis not present

## 2019-01-05 ENCOUNTER — Other Ambulatory Visit: Payer: Self-pay

## 2019-01-05 ENCOUNTER — Encounter: Payer: Self-pay | Admitting: Cardiovascular Disease

## 2019-01-05 ENCOUNTER — Ambulatory Visit (INDEPENDENT_AMBULATORY_CARE_PROVIDER_SITE_OTHER): Payer: Medicare Other | Admitting: Cardiovascular Disease

## 2019-01-05 VITALS — BP 104/72 | HR 88 | Temp 97.4°F | Ht 60.35 in | Wt 238.0 lb

## 2019-01-05 DIAGNOSIS — I10 Essential (primary) hypertension: Secondary | ICD-10-CM | POA: Diagnosis not present

## 2019-01-05 DIAGNOSIS — Z008 Encounter for other general examination: Secondary | ICD-10-CM

## 2019-01-05 DIAGNOSIS — Z23 Encounter for immunization: Secondary | ICD-10-CM | POA: Diagnosis not present

## 2019-01-05 DIAGNOSIS — E782 Mixed hyperlipidemia: Secondary | ICD-10-CM | POA: Diagnosis not present

## 2019-01-05 DIAGNOSIS — I480 Paroxysmal atrial fibrillation: Secondary | ICD-10-CM

## 2019-01-05 DIAGNOSIS — E785 Hyperlipidemia, unspecified: Secondary | ICD-10-CM | POA: Diagnosis not present

## 2019-01-05 NOTE — Progress Notes (Signed)
01/05/2019 [name]   June 26, 1945  PH:[id number]  Primary Physician [name], MD Primary Cardiologist: Lorretta Harp MD FACP, [place], [place], [place]  HPI:  [name] is a 73 y.o.  severely overweight, married Caucasian female who was seen by Dr. Aldona Bar in the office on September 08, 2011, for chest pain. I last saw her in the office  09/06/2017.Marland KitchenMarland KitchenHer risk factors include hypertension and hyperlipidemia. She also has GERD. She was admitted for heart cath which I performed radially on September 10, 2011, that was essentially normal with normal LV function. Her 2D echo was normal as well. Her major complaints have been progressive dyspnea on exertion. She does have reactive airway disease and had pneumonia in the recent past as well. 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 She is seeing Dr. Halford Chessman for pulmonary evaluation for her back here for evaluation of sinus tachycardia. She really denies chest pain but does get dyspneic on exertion which was a complaint when I saw her 2 years ago as well. Recent thyroid function tests were normal. An event monitor showed sinus rhythm, sinus tachycardia with episodes of paroxysmal atrial fibrillation. Based on her age, is gender and history of hypertension she would be a candidate for oral anticoagulation. Because of a CHA2DS2VASC score of 3, she was begun on Eiquis oral anticoagulations.She has had bariatric surgery performed by Dr. 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 Alvan Dame. She has since lost 90 pounds and is now ambulatory.she denies chest pain or shortness of breath. She is much more active and has more energy. A30 day event monitor showed sinus rhythm with PACs but no evidence of PAFlast year and therefore I discontinued oral anticoagulation. She recently had a right shoulder replacement by Dr.Norrisand slowly recuperating from  this. She is still suffering from vocal cord dysfunction and episodic dyspnea that we do not think this is cardiovascular in nature.  Since I saw her a year ago she is remained stable.  She is completely asymptomatic.   Current Meds  Medication Sig   acetaminophen (TYLENOL) 500 MG tablet Take 1,000 mg by mouth every 6 (six) hours as needed (for pain.).    albuterol (PROVENTIL HFA;VENTOLIN HFA) 108 (90 Base) MCG/ACT inhaler Inhale 1-2 puffs into the lungs every 6 (six) hours as needed for shortness of breath or wheezing.   ALPRAZolam (XANAX) 0.25 MG tablet Take 0.125 mg by mouth as needed for anxiety.   Biotin 10000 MCG TABS Take 10,000 mcg by mouth at bedtime.    cetirizine (ZYRTEC) 10 MG tablet Take 10 mg by mouth daily.    cholecalciferol (VITAMIN D) 1000 units tablet Take 1 tablet (1,000 Units total) by mouth 2 (two) times daily. (Patient taking differently: Take 2,000 Units by mouth 2 (two) times daily. )   dicyclomine (BENTYL) 10 MG capsule Take 1 tab twice daily as needed.   DILT-XR 180 MG 24 hr capsule Take 1 capsule by mouth once daily   DULoxetine (CYMBALTA) 60 MG capsule Take 60 mg by mouth daily with supper.    Loperamide HCl (IMODIUM PO) Take 1 tablet by mouth daily.   Multiple Vitamin (MULTIVITAMIN WITH MINERALS) TABS tablet Take 1 tablet by mouth daily. Centrum    simvastatin (ZOCOR) 20 MG tablet Take 1 tablet (20 mg total) by mouth daily.   traMADol (ULTRAM) 50 MG tablet Take 50 mg  by mouth every 6 (six) hours as needed (for pain.).    vitamin B-12 (CYANOCOBALAMIN) 1000 MCG tablet Take 1,000 mcg by mouth daily.   [DISCONTINUED] pantoprazole (PROTONIX) 40 MG tablet Take 1 tablet (40 mg total) by mouth daily.   [DISCONTINUED] Probiotic Product (ALIGN PO) Take 1 tablet by mouth as needed.     Allergies  Allergen Reactions   Ativan [Lorazepam] Other (See Comments)    Agitation, aggressive actions, significant mental changes   Buprenorphine Hcl Itching and  Rash   Fentanyl Itching   Morphine And Related Itching and Rash    Social History   Socioeconomic History   Marital status: Married    Spouse name: John   Number of children: 2   Years of education: Not on file   Highest education level: Not on file  Occupational History   Occupation: retired    Fish farm manager: RETIRED  Scientist, product/process development strain: Not on file   Food insecurity    Worry: Not on file    Inability: Not on file   Transportation needs    Medical: Not on file    Non-medical: Not on file  Tobacco Use   Smoking status: Never Smoker   Smokeless tobacco: Never Used  Substance and Sexual Activity   Alcohol use: No   Drug use: No   Sexual activity: Yes    Partners: Male  Lifestyle   Physical activity    Days per week: Not on file    Minutes per session: Not on file   Stress: Not on file  Relationships   Social connections    Talks on phone: Not on file    Gets together: Not on file    Attends religious service: Not on file    Active member of club or organization: Not on file    Attends meetings of clubs or organizations: Not on file    Relationship status: Not on file   Intimate partner violence    Fear of current or ex partner: Not on file    Emotionally abused: Not on file    Physically abused: Not on file    Forced sexual activity: Not on file  Other Topics Concern   Not on file  Social History Narrative   Not on file     Review of Systems: General: negative for chills, fever, night sweats or weight changes.  Cardiovascular: negative for chest pain, dyspnea on exertion, edema, orthopnea, palpitations, paroxysmal nocturnal dyspnea or shortness of breath Dermatological: negative for rash Respiratory: negative for cough or wheezing Urologic: negative for hematuria Abdominal: negative for nausea, vomiting, diarrhea, bright red blood per rectum, melena, or hematemesis Neurologic: negative for visual changes, syncope, or  dizziness All other systems reviewed and are otherwise negative except as noted above.    Blood pressure 104/72, pulse 88, temperature (!) 97.4 F (36.3 C), height 5' 0.35" (1.533 m), weight 238 lb (108 kg).  General appearance: alert and no distress Neck: no adenopathy, no carotid bruit, no JVD, supple, symmetrical, trachea midline and thyroid not enlarged, symmetric, no tenderness/mass/nodules Lungs: clear to auscultation bilaterally Heart: regular rate and rhythm, S1, S2 normal, no murmur, click, rub or gallop Extremities: extremities normal, atraumatic, no cyanosis or edema Pulses: 2+ and symmetric Skin: Skin color, texture, turgor normal. No rashes or lesions Neurologic: Alert and oriented X 3, normal strength and tone. Normal symmetric reflexes. Normal coordination and gait  EKG sinus rhythm at 88 without ST or T wave changes.  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  I personally reviewed this EKG.  ASSESSMENT AND PLAN:   HTN (hypertension) History of essential hypertension with blood pressure measured today at 104/72.  He is on diltiazem XR 180 mg a day.  Hyperlipemia History of hyperlipidemia on statin therapy with lipid profile performed 04/14/2018 revealing total cholesterol 192, LDL 99 and HDL 71  Paroxysmal atrial fibrillation History of paroxysmal atrial fibrillation in the past monitor that showed sinus rhythm after which oral anticoagulation was discontinued.      Lorretta Harp MD FACP,FACC,FAHA, [facility] 01/05/2019 11:58 AM

## 2019-01-05 NOTE — Patient Instructions (Signed)

## 2019-01-05 NOTE — Assessment & Plan Note (Signed)
History of essential hypertension with blood pressure measured today at 104/72.  He is on diltiazem XR 180 mg a day.

## 2019-01-05 NOTE — Assessment & Plan Note (Signed)
History of paroxysmal atrial fibrillation in the past monitor that showed sinus rhythm after which oral anticoagulation was discontinued.

## 2019-01-05 NOTE — Assessment & Plan Note (Signed)
History of hyperlipidemia on statin therapy with lipid profile performed 04/14/2018 revealing total cholesterol 192, LDL 99 and HDL 71

## 2019-01-26 ENCOUNTER — Other Ambulatory Visit: Payer: Self-pay | Admitting: Cardiovascular Disease

## 2019-01-30 DIAGNOSIS — H2513 Age-related nuclear cataract, bilateral: Secondary | ICD-10-CM | POA: Diagnosis not present

## 2019-02-23 ENCOUNTER — Other Ambulatory Visit: Payer: Self-pay | Admitting: Cardiovascular Disease

## 2019-02-26 DIAGNOSIS — I10 Essential (primary) hypertension: Secondary | ICD-10-CM | POA: Diagnosis not present

## 2019-02-26 DIAGNOSIS — J209 Acute bronchitis, unspecified: Secondary | ICD-10-CM | POA: Diagnosis not present

## 2019-02-26 DIAGNOSIS — Z20818 Contact with and (suspected) exposure to other bacterial communicable diseases: Secondary | ICD-10-CM | POA: Diagnosis not present

## 2019-02-26 DIAGNOSIS — J069 Acute upper respiratory infection, unspecified: Secondary | ICD-10-CM | POA: Diagnosis not present

## 2019-02-26 DIAGNOSIS — R05 Cough: Secondary | ICD-10-CM | POA: Diagnosis not present

## 2019-03-07 ENCOUNTER — Other Ambulatory Visit: Payer: Self-pay | Admitting: Internal Medicine

## 2019-03-09 ENCOUNTER — Other Ambulatory Visit: Payer: Self-pay | Admitting: Internal Medicine

## 2019-03-12 ENCOUNTER — Other Ambulatory Visit: Payer: Self-pay | Admitting: Gastroenterology

## 2019-03-12 NOTE — Telephone Encounter (Signed)
Do you want patient on Carafate? Your last note did not say anything.

## 2019-03-12 NOTE — Telephone Encounter (Signed)
If she finds this helpful we can refill it. I have not seen her in over a year, may be good to make a routine follow up office visit and we can refill this in the interim. Thanks

## 2019-03-13 ENCOUNTER — Other Ambulatory Visit: Payer: Self-pay | Admitting: Internal Medicine

## 2019-03-13 MED ORDER — SUCRALFATE 1 GM/10ML PO SUSP: 1.0000 g | Freq: Four times a day (QID) | ORAL | 1 refills | Status: DC

## 2019-03-13 NOTE — Telephone Encounter (Signed)
Spoke with patient and informed her that she needed a yearly follow up. Scheduled appointment. Refill sent in for Carafate.

## 2019-03-27 DIAGNOSIS — M19012 Primary osteoarthritis, left shoulder: Secondary | ICD-10-CM | POA: Diagnosis not present

## 2019-03-27 DIAGNOSIS — M25512 Pain in left shoulder: Secondary | ICD-10-CM | POA: Diagnosis not present

## 2019-04-04 ENCOUNTER — Other Ambulatory Visit: Payer: Self-pay | Admitting: Gastroenterology

## 2019-04-07 ENCOUNTER — Other Ambulatory Visit: Payer: Self-pay | Admitting: Gastroenterology

## 2019-04-09 DIAGNOSIS — Z1231 Encounter for screening mammogram for malignant neoplasm of breast: Secondary | ICD-10-CM | POA: Diagnosis not present

## 2019-04-10 ENCOUNTER — Other Ambulatory Visit: Payer: Self-pay | Admitting: Gastroenterology

## 2019-04-10 DIAGNOSIS — M19012 Primary osteoarthritis, left shoulder: Secondary | ICD-10-CM | POA: Diagnosis not present

## 2019-04-10 DIAGNOSIS — M25512 Pain in left shoulder: Secondary | ICD-10-CM | POA: Diagnosis not present

## 2019-04-14 ENCOUNTER — Other Ambulatory Visit: Payer: Self-pay | Admitting: Gastroenterology

## 2019-04-16 ENCOUNTER — Other Ambulatory Visit: Payer: Self-pay | Admitting: Gastroenterology

## 2019-04-18 ENCOUNTER — Ambulatory Visit (INDEPENDENT_AMBULATORY_CARE_PROVIDER_SITE_OTHER): Payer: Medicare Other | Admitting: Gastroenterology

## 2019-04-18 ENCOUNTER — Other Ambulatory Visit: Payer: Self-pay

## 2019-04-18 VITALS — BP 121/64 | HR 70 | Temp 98.6°F | Ht 63.0 in | Wt 244.2 lb

## 2019-04-18 DIAGNOSIS — Z01818 Encounter for other preprocedural examination: Secondary | ICD-10-CM | POA: Diagnosis not present

## 2019-04-18 DIAGNOSIS — R1310 Dysphagia, unspecified: Secondary | ICD-10-CM

## 2019-04-18 DIAGNOSIS — R151 Fecal smearing: Secondary | ICD-10-CM

## 2019-04-18 DIAGNOSIS — K219 Gastro-esophageal reflux disease without esophagitis: Secondary | ICD-10-CM | POA: Diagnosis not present

## 2019-04-18 DIAGNOSIS — K580 Irritable bowel syndrome with diarrhea: Secondary | ICD-10-CM

## 2019-04-18 MED ORDER — SUCRALFATE 1 G PO TABS: ORAL_TABLET | ORAL | 2 refills | Status: DC

## 2019-04-18 MED ORDER — PANTOPRAZOLE SODIUM 40 MG PO TBEC: DELAYED_RELEASE_TABLET | ORAL | 3 refills | Status: DC

## 2019-04-18 NOTE — Progress Notes (Signed)
HPI :  74 year old female here for follow-up visit for dysphagia, abdominal pains, bowel changes.  I have seen her in the past for problems with her bowels.  She has had episodic loose stools as well as intermittent abdominal pains in her upper abdomen as well as lower abdomen without clear triggers.  She thinks that bowel movements can sometimes be related to her pain.  Not really associated with any prandial intake.  She has been using Imodium fairly routinely to keep her stools regular.  She had a colonoscopy at the end of 2019 which ruled out microscopic colitis.  She has been using Bentyl as needed as well as Carafate for intermittent pains and states these both worked quite well for her and she has been using them as needed.  When she does have the pain though it can be severe and she has a hard time functioning.  She is also had an upper endoscopy as well as a CT scan of the abdomen in the past without clear cause.  Negative stool test for infection and pancreatic elastase in the past as well as labs for celiac disease in 2014.  She states she eats a lot of popcorn because she thinks it keeps her fairly regular.  She does have some rare leakage of her stool with some positional changes.  She also has some incontinence of urine as well at times.  She is asking what she can do for this issue.  Otherwise no clear triggers for her other pains that she can have, although again using Bentyl and Carafate as needed really helps her.  She otherwise complains of dysphagia.  This only occurs with solids, periodically feels that her " esophagus and throat closing up" and she has a hard time swallowing foods.  Previously this happened quite rarely, now it is happening much more frequently.  [place]ice in particular is what seems to frequently trigger this.  It happens intermittently.  She has been taking Protonix at 40 mg once a day.  She previously was on twice a day and we have trying to wean her down, but she does  have some breakthrough heartburn that bothers her fairly frequently.  She has gained a little bit of weight recently.  No vomiting.  I performed an endoscopy for her in January 2019 for some of her upper tract symptoms, however she was not having dysphagia at the time and her esophagus was normal.  CT scan 01/13/2018 - no acute findings  EGD 04/15/2017 - sleeve gastrectomy, otherwise normal exam - biopsies taken to rule out H pylori -  Colonoscopy 04/15/2017 - normal TI, multiple diverticuli, normal exam otherwise - biopsies taken to rule out microscopic colitis    Past Medical History:  Diagnosis Date  . Allergy   . Anemia   . Antral polyp    benign  . Anxiety   . Arthritis   . Asthma   . B12 deficiency   . Back pain   . Chest pain   . Constipation   . Depression   . Diverticulosis   . Gallbladder problem   . GERD (gastroesophageal reflux disease) 09/09/2011  . History of transfusion   . HTN (hypertension) 09/09/2011  . Hyperlipemia 09/09/2011  . Hypertension   . IBS (irritable bowel syndrome)   . Internal hemorrhoid   . Joint pain   . Obesity   . Osteoarthritis   . Paroxysmal atrial fibrillation (HCC)   . PONV (postoperative nausea and vomiting)  with big surgeries, none with endos etc, with colonoscopy- 5-6 years ago could not swallow   . Shortness of breath   . Sleep apnea    borderline per patient no cpap   . SOB (shortness of breath) on exertion   . Swallowing difficulty   . Tachycardia   . Unstable angina ([place]) 09/09/2011  . Vitamin D deficiency   . Vocal cord dysfunction 2013   swelling     Past Surgical History:  Procedure Laterality Date  . ABDOMINAL HYSTERECTOMY  1988  . ANKLE FUSION Right    x 2  . BACK SURGERY  [phone]   3 ruptured discs, lower back  . BILATERAL ANTERIOR TOTAL HIP ARTHROPLASTY Bilateral 11/12/2014   Procedure: BILATERAL ANTERIOR TOTAL HIP ARTHROPLASTY;  Surgeon: Paralee Cancel, MD;  Location: [facility] OR[facility];  Service: Orthopedics;   Laterality: Bilateral;  . BRAVO [place] STUDY [name] 03/06/2013   Procedure: BRAVO [place] STUDY;  Surgeon: Inda Castle, MD;  Location: [facility] ENDOSCOPY;  Service: Endoscopy;  Laterality: [name];  . CARDIAC CATHETERIZATION  2013   NORMAL  . CHOLECYSTECTOMY    . COLONOSCOPY    . ELBOW SURGERY Left    removed bone chip  . ESOPHAGOGASTRODUODENOSCOPY [name] 03/06/2013   Procedure: ESOPHAGOGASTRODUODENOSCOPY (EGD);  Surgeon: Inda Castle, MD;  Location: [facility];  Service: Endoscopy;  Laterality: [name];  . FOOT OSTEOTOMY W/ PLANTAR FASCIA RELEASE Left   . ILIOTIBIAL BAND RELEASE Right 1997  . JOINT REPLACEMENT    . LAPAROSCOPIC GASTRIC SLEEVE RESECTION WITH HIATAL HERNIA REPAIR  08/20/2014   Procedure: LAPAROSCOPIC GASTRIC SLEEVE RESECTION WITH HIATAL HERNIA  REPAIR AND  UPPER ENDOSCOPY;  Surgeon: Johnathan Hausen, MD;  Location: [facility];  Service: General;;  . LEFT HEART CATHETERIZATION WITH CORONARY ANGIOGRAM Bilateral 09/10/2011   Procedure: LEFT HEART CATHETERIZATION WITH CORONARY ANGIOGRAM;  Surgeon: Lorretta Harp, MD;  [facility]: [facility] CATH LAB;  Service: Cardiovascular;  Laterality: Bilateral;  . NASAL SINUS SURGERY  1997  . REVERSE SHOULDER ARTHROPLASTY Right 05/14/2016   Procedure: RIGHT REVERSE SHOULDER ARTHROPLASTY;  Surgeon: Netta Cedars, MD;  Location: [facility];  Service: Orthopedics;  Laterality: Right;  . SHOULDER SURGERY Left   . tfc wrist Left 1997  . thumb replacement Bilateral    one 2009 and one 2014  . TOTAL ANKLE REPLACEMENT Right    x 3  . TOTAL ANKLE REPLACEMENT Left   . TOTAL ANKLE REPLACEMENT    . TOTAL HIP ARTHROPLASTY Bilateral   . TOTAL KNEE ARTHROPLASTY Bilateral    left 2002, right 2012  . TOTAL SHOULDER ARTHROPLASTY Right 05/14/2016  . TRANSTHORACIC ECHOCARDIOGRAM  09/09/2011   MILD CONCENTRIC LVH. TRACE MITRAL REGURG. TRACE TR. MILD AORTIC REGURG.  . 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 UPPER GI ENDOSCOPY  08/20/2014   Procedure: UPPER GI ENDOSCOPY;  Surgeon: Johnathan Hausen, MD;  Location: [facility];  Service: General;;  . WRIST SURGERY Left 1997, 2009    Family History  Problem Relation Age of Onset  . Hypertension Mother   . Diabetes Mother   . Arthritis Mother   . Esophageal cancer Mother   . Cancer Mother   . Esophageal cancer Father 33  . Cancer Father   . Alcoholism Father   . Other Sister        kidney mass removed  . Hypertension Sister   . Heart disease Maternal Grandmother   . Stroke Paternal Grandmother   . Coronary artery disease Neg Hx   .  Colon cancer Neg Hx   . Liver disease Neg Hx   . Kidney disease Neg Hx   . Rectal cancer Neg Hx   . Stomach cancer Neg Hx    Social History   Tobacco Use  . Smoking status: Never Smoker  . Smokeless tobacco: Never Used  Substance Use Topics  . Alcohol use: No  . Drug use: No   Current Outpatient Medications  Medication Sig Dispense Refill  . acetaminophen (TYLENOL) 500 MG tablet Take 1,000 mg by mouth every 6 (six) hours as needed (for pain.).     [name] albuterol (PROVENTIL HFA;VENTOLIN HFA) 108 (90 Base) MCG/ACT inhaler Inhale 1-2 puffs into the lungs every 6 (six) hours as needed for shortness of breath or wheezing.    [name] ALPRAZolam (XANAX) 0.25 MG tablet Take 0.125 mg by mouth as needed for anxiety.    . Biotin 10000 MCG TABS Take 10,000 mcg by mouth at bedtime.     . cetirizine (ZYRTEC) 10 MG tablet Take 10 mg by mouth daily.     . cholecalciferol (VITAMIN D) 1000 units tablet Take 1 tablet (1,000 Units total) by mouth 2 (two) times daily. (Patient taking differently: Take 2,000 Units by mouth 2 (two) times daily. ) 60 tablet 0  . dicyclomine (BENTYL) 10 MG capsule TAKE 1 CAPSULE BY MOUTH TWICE DAILY AS NEEDED 60 capsule 3  . DILT-XR 180 MG 24 hr capsule Take 1 capsule by mouth once daily 30 capsule 10  . DULoxetine (CYMBALTA) 60 MG capsule Take 60 mg by mouth daily with supper.     . Loperamide HCl (IMODIUM PO) Take 1 tablet by mouth daily.    . Multiple Vitamin (MULTIVITAMIN WITH  MINERALS) TABS tablet Take 1 tablet by mouth daily. Centrum     . pantoprazole (PROTONIX) 40 MG tablet Take 1 tablet (40 mg total) by mouth daily. Please keep your Jan 2021 appt for any further refills 30 tablet 0  . simvastatin (ZOCOR) 20 MG tablet Take 1 tablet (20 mg total) by mouth daily. 30 tablet 11  . sucralfate (CARAFATE) 1 g tablet TAKE 1 TABLET BY MOUTH 4 TIMES DAILY WITH MEALS AND AT BEDTIME MAY  DISSOLVE  TABLET  IN  1  TABLESPOON  OF  WATER  TO  TAKE  AS  A  SLURRY 120 tablet 0  . traMADol (ULTRAM) 50 MG tablet Take 50 mg by mouth every 6 (six) hours as needed (for pain.).     [name] vitamin B-12 (CYANOCOBALAMIN) 1000 MCG tablet Take 1,000 mcg by mouth daily.     No current facility-administered medications for this visit.   Allergies  Allergen Reactions  . Ativan [Lorazepam] Other (See Comments)    Agitation, aggressive actions, significant mental changes  . Cefdinir Hives    Hives, headache  . Buprenorphine Hcl Itching and Rash  . Fentanyl Itching  . Morphine And Related Itching and Rash     Review of Systems: All systems reviewed and negative except where noted in HPI.   Lab Results  Component Value Date   WBC 4.2 03/04/2017   HGB 12.7 03/04/2017   HCT 39.0 03/04/2017   MCV 95.3 03/04/2017   PLT 183.0 03/04/2017    Lab Results  Component Value Date   CREATININE 0.94 01/11/2018   BUN 16 01/11/2018   NA 143 11/17/2017   K 4.3 11/17/2017   CL 106 11/17/2017   CO2 22 11/17/2017    Lab Results  Component Value Date  ALT 20 11/17/2017   AST 38 11/17/2017   ALKPHOS 91 11/17/2017   BILITOT 0.3 11/17/2017     Physical Exam: BP 121/64   Pulse 70   Temp 98.6 F (37 C)   Ht 5\' 3"  (1.6 m)   Wt 244 lb 3.2 oz (110.8 kg)   BMI 43.26 kg/m  Constitutional: Pleasant,well-developed, female in no acute distress. HEENT: Normocephalic and atraumatic. Conjunctivae are normal. No scleral icterus. Neck supple.  Cardiovascular: Normal rate, regular rhythm.    Pulmonary/chest: Effort normal and breath sounds normal. No wheezing, rales or rhonchi. Abdominal: Soft, nondistended, mild upper abdominal TTP.  There are no masses palpable. Extremities: no edema Lymphadenopathy: No cervical adenopathy noted. Neurological: Alert and oriented to person place and time. Skin: Skin is warm and dry. No rashes noted. Psychiatric: Normal mood and affect. Behavior is normal.   ASSESSMENT AND PLAN: 74 year old female here for reassessment of the following:  Dysphagia / GERD - fairly recent symptoms of intermittent solid food dysphagia, based on history hard to tell if this can be oropharyngeal versus esophageal.  Her last EGD did not show any obvious stenosis.  I discussed differential diagnosis with her to include subtle stenosis, motility disorder, oropharyngeal dysphagia.  I discussed options to evaluate this which include barium studies, EGD with empiric dilation, or motility.  We discussed risk and benefits of each of these.  She is requesting EGD with empiric dilation after discussion of options.  I discussed risks and benefits of this and she want to proceed.  Further recommendations pending the results.  Of note, given her worsening reflux symptoms will increase her Protonix back to 40 mg twice a day.  We had tried to keep her on the lowest dose of PPI needed to control symptoms however she is not doing as well on a lower dose.  She understands risks of long-term PPI use.  IBS - I suspect she probably has a functional bowel disorder/IBS causing her symptoms.  She responds quite well to Bentyl as needed and this is really helped her abdominal pains.  She is using Imodium fairly frequently which keeps her stools regular.  She should continue this regimen.  No evidence of IBD or microscopic colitis to this point.  Fecal / urinary incontinence - intermittent fecal and urinary incontinence.  I am going to refer her to pelvic floor PT to see if strengthening the pelvic  floor will help the symptoms.  I discussed with this was with her and she was agreeable to consultation.  Further recommendations pending her course and the results of this.  I spent 45 minutes of time, including in depth chart review, independent review of results as outlined above, communicating results with the patient directly, face-to-face time with the patient, coordinating care, ordering studies and medications as appropriate, and documenting this encounter.   Nance Cellar, MD [facility]

## 2019-04-18 NOTE — Patient Instructions (Addendum)
If you are age 73 or older, your body mass index should be between 23-30. Your Body mass index is 43.26 kg/m. If this is out of the aforementioned range listed, please consider follow up with your Primary Care Provider.  If you are age 58 or younger, your body mass index should be between 19-25. Your Body mass index is 43.26 kg/m. If this is out of the aformentioned range listed, please consider follow up with your Primary Care Provider.   You have been scheduled for an endoscopy. Please follow written instructions given to you at your visit today. If you use inhalers (even only as needed), please bring them with you on the day of your procedure. Your physician has requested that you go to www.startemmi.com and enter the access code given to you at your visit today. This web site gives a general overview about your procedure. However, you should still follow specific instructions given to you by our office regarding your preparation for the procedure.  We have sent the following medications to your pharmacy for you to pick up at your convenience: Protonix 40mg : Take once to twice daily  Continue Bentyl and Carafate  We are referring you to Pelvic Floor Physical Therapy.  They will reach out to you to schedule an appointment.   Thank you for entrusting me with your care and for choosing [facility], Dr. Garrard Cellar

## 2019-04-19 ENCOUNTER — Ambulatory Visit (INDEPENDENT_AMBULATORY_CARE_PROVIDER_SITE_OTHER): Payer: Medicare Other

## 2019-04-19 DIAGNOSIS — Z1159 Encounter for screening for other viral diseases: Secondary | ICD-10-CM | POA: Diagnosis not present

## 2019-04-20 DIAGNOSIS — M8580 Other specified disorders of bone density and structure, unspecified site: Secondary | ICD-10-CM | POA: Diagnosis not present

## 2019-04-20 DIAGNOSIS — E7849 Other hyperlipidemia: Secondary | ICD-10-CM | POA: Diagnosis not present

## 2019-04-20 DIAGNOSIS — M8589 Other specified disorders of bone density and structure, multiple sites: Secondary | ICD-10-CM | POA: Diagnosis not present

## 2019-04-20 DIAGNOSIS — M859 Disorder of bone density and structure, unspecified: Secondary | ICD-10-CM | POA: Diagnosis not present

## 2019-04-20 DIAGNOSIS — R7301 Impaired fasting glucose: Secondary | ICD-10-CM | POA: Diagnosis not present

## 2019-04-20 LAB — SARS CORONAVIRUS 2 (TAT 6-24 HRS): SARS Coronavirus 2: NEGATIVE

## 2019-04-23 ENCOUNTER — Telehealth: Payer: Self-pay | Admitting: Cardiovascular Disease

## 2019-04-23 ENCOUNTER — Ambulatory Visit (AMBULATORY_SURGERY_CENTER): Payer: Medicare Other | Admitting: Gastroenterology

## 2019-04-23 ENCOUNTER — Encounter: Payer: Self-pay | Admitting: Gastroenterology

## 2019-04-23 ENCOUNTER — Other Ambulatory Visit: Payer: Self-pay

## 2019-04-23 VITALS — BP 145/73 | HR 77 | Temp 95.7°F | Resp 18

## 2019-04-23 DIAGNOSIS — K219 Gastro-esophageal reflux disease without esophagitis: Secondary | ICD-10-CM

## 2019-04-23 DIAGNOSIS — R1310 Dysphagia, unspecified: Secondary | ICD-10-CM

## 2019-04-23 DIAGNOSIS — R82998 Other abnormal findings in urine: Secondary | ICD-10-CM | POA: Diagnosis not present

## 2019-04-23 DIAGNOSIS — K449 Diaphragmatic hernia without obstruction or gangrene: Secondary | ICD-10-CM

## 2019-04-23 DIAGNOSIS — I10 Essential (primary) hypertension: Secondary | ICD-10-CM | POA: Diagnosis not present

## 2019-04-23 MED ORDER — SODIUM CHLORIDE 0.9 % IV SOLN: 500.0000 mL | Freq: Once | INTRAVENOUS | Status: DC

## 2019-04-23 NOTE — Progress Notes (Signed)
Called to room to assist during endoscopic procedure.  Patient ID and intended procedure confirmed with present staff. Received instructions for my participation in the procedure from the performing physician.  

## 2019-04-23 NOTE — Telephone Encounter (Signed)
  New message:    Patient is having AFIB she had a appt today at [facility] they told her to call.

## 2019-04-23 NOTE — Patient Instructions (Signed)
Follow post dilatation diet given to you todat  Call Dr Havery Moros is symptoms persist over the next few weeks   Continue Protonix 40 mg twice daily    YOU HAD AN ENDOSCOPIC PROCEDURE TODAY AT [facility]:   Refer to the procedure report that was given to you for any specific questions about what was found during the examination.  If the procedure report does not answer your questions, please call your gastroenterologist to clarify.  If you requested that your care partner not be given the details of your procedure findings, then the procedure report has been included in a sealed envelope for you to review at your convenience later.  YOU SHOULD EXPECT: Some feelings of bloating in the abdomen. Passage of more gas than usual.  Walking can help get rid of the air that was put into your GI tract during the procedure and reduce the bloating. If you had a lower endoscopy (such as a colonoscopy or flexible sigmoidoscopy) you may notice spotting of blood in your stool or on the toilet paper. If you underwent a bowel prep for your procedure, you may not have a normal bowel movement for a few days.  Please Note:  You might notice some irritation and congestion in your nose or some drainage.  This is from the oxygen used during your procedure.  There is no need for concern and it should clear up in a day or so.  SYMPTOMS TO REPORT IMMEDIATELY:    Following upper endoscopy (EGD)  Vomiting of blood or coffee ground material  New chest pain or pain under the shoulder blades  Painful or persistently difficult swallowing  New shortness of breath  Fever of 100F or higher  Black, tarry-looking stools  For urgent or emergent issues, a gastroenterologist can be reached at any hour by calling [phone].   DIET:  We do recommend a small meal at first, but then you may proceed to your regular diet.  Drink plenty of fluids but you should avoid alcoholic beverages for 24  hours.  ACTIVITY:  You should plan to take it easy for the rest of today and you should NOT DRIVE or use heavy machinery until tomorrow (because of the sedation medicines used during the test).    FOLLOW UP: Our staff will call the number listed on your records 48-72 hours following your procedure to check on you and address any questions or concerns that you may have regarding the information given to you following your procedure. If we do not reach you, we will leave a message.  We will attempt to reach you two times.  During this call, we will ask if you have developed any symptoms of COVID 19. If you develop any symptoms (ie: fever, flu-like symptoms, shortness of breath, cough etc.) before then, please call [phone].  If you test positive for Covid 19 in the 2 weeks post procedure, please call and report this information to [place].    If any biopsies were taken you will be contacted by phone or by letter within the next 1-3 weeks.  Please call us at [phone] if you have not heard about the biopsies in 3 weeks.    SIGNATURES/CONFIDENTIALITY: You and/or your care partner have signed paperwork which will be entered into your electronic medical record.  These signatures attest to the fact that that the information above on your After Visit Summary has been reviewed and is understood.  Full responsibility of the confidentiality  of this discharge information lies with you and/or your care-partner.

## 2019-04-23 NOTE — Progress Notes (Signed)
CRNA ,Powers ,reported that pt in A-fib and that is pt's normal that she stays in A-fib ,but pt says she did not know she was in a fib that she did not think she had been in it since she lost a lot of wt. Pt will call her cardiologist as advised.

## 2019-04-23 NOTE — Progress Notes (Signed)
A and O x3. Report to RN. Tolerated MAC anesthesia well.Teeth unchanged after procedure.

## 2019-04-23 NOTE — Telephone Encounter (Signed)
Spoke with pt who states she had endoscopy today and was told that during procedure she was in A fib and still remained in Afib before discharge. Pt states she was unaware she was in afib. Pt states she has had dizzy spells lately but has hx vertigo and attributed her dizzy spells to this. She states she did have dizzy spells the last time she was in afib. Pt denies palpitations or other Afib symptoms. She states she has had a lot HA's. Per pt, BP today for endoscopy appt was 134/63, HR 76, and SPO2 96%. Pt states she was advised to f/u with her cardiologist regarding her afib. Appt scheduled for 2/3 at 10:45 am with Dr. Gwenlyn Found. Pt agreeable. Advised may need to call 911 for presyncope, CP, SOB if these occur prior to 2/3 appt.

## 2019-04-23 NOTE — Op Note (Signed)
[facility] Patient Name: [name] Procedure Date: 04/23/2019 2:42 PM MRN: [id number] Endoscopist: Remo Lipps P. Havery Moros , MD Age: 74 Referring MD:  Date of Birth: 02-15-1946 Gender: Female Account #: [id number] Procedure:                Upper GI endoscopy Indications:              Dysphagia, gastro-esophageal reflux disease -                            history of gastric sleeve, recently increased                            protonix to 40mg  twice daily for worsening reflux Medicines:                Monitored Anesthesia Care Procedure:                Pre-Anesthesia Assessment:                           - Prior to the procedure, a History and Physical                            was performed, and patient medications and                            allergies were reviewed. The patient's tolerance of                            previous anesthesia was also reviewed. The risks                            and benefits of the procedure and the sedation                            options and risks were discussed with the patient.                            All questions were answered, and informed consent                            was obtained. Prior Anticoagulants: The patient has                            taken no previous anticoagulant or antiplatelet                            agents. ASA Grade Assessment: III - A patient with                            severe systemic disease. After reviewing the risks                            and benefits, the patient was deemed in  satisfactory condition to undergo the procedure.                           After obtaining informed consent, the endoscope was                            passed under direct vision. Throughout the                            procedure, the patient's blood pressure, pulse, and                            oxygen saturations were monitored continuously. The                             Endoscope was introduced through the mouth, and                            advanced to the second part of duodenum. The upper                            GI endoscopy was accomplished without difficulty.                            The patient tolerated the procedure well. Scope In: Scope Out: Findings:                 Esophagogastric landmarks were identified: the                            Z-line was found at 35 cm, the gastroesophageal                            junction was found at 35 cm and the upper extent of                            the gastric folds was found at 38 cm from the                            incisors.                           A 3 cm hiatal hernia was present.                           The exam of the esophagus was otherwise normal.                           A guidewire was placed and the scope was withdrawn.                            Dilation was performed in the entire esophagus with  a Savary dilator with mild resistance at 17 mm and                            18 mm. Relook endoscopy showed no mucosal wrents.                           Evidence of a sleeve gastrectomy was found in the                            cardia / fundus. This was characterized by healthy                            appearing mucosa.                           The exam of the stomach was otherwise normal.                           The duodenal bulb and second portion of the                            duodenum were normal. Complications:            No immediate complications. Estimated blood loss:                            Minimal. Estimated Blood Loss:     Estimated blood loss was minimal. Impression:               - Esophagogastric landmarks identified.                           - 3 cm hiatal hernia.                           - Normal esophagus otherwise - empiric dilation                            performed at 100mm and 78mm as above                           - A  sleeve gastrectomy was found, characterized by                            healthy appearing mucosa.                           - Normal duodenal bulb and second portion of the                            duodenum. Recommendation:           - Patient has a contact number available for                            emergencies. The signs and symptoms of potential  delayed complications were discussed with the                            patient. Return to normal activities tomorrow.                            Written discharge instructions were provided to the                            patient.                           - Resume previous diet.                           - Continue present medications including protonix                            40mg  twice daily                           - Await course post dilation. If symptoms persist                            over the next few weeks, please call me for                            reassessment. Remo Lipps P. Landan Fedie, MD 04/23/2019 3:12:40 PM This report has been signed electronically.

## 2019-04-25 ENCOUNTER — Other Ambulatory Visit: Payer: Self-pay

## 2019-04-25 ENCOUNTER — Encounter: Payer: Self-pay | Admitting: Cardiovascular Disease

## 2019-04-25 ENCOUNTER — Ambulatory Visit (INDEPENDENT_AMBULATORY_CARE_PROVIDER_SITE_OTHER): Payer: Medicare Other | Admitting: Cardiovascular Disease

## 2019-04-25 ENCOUNTER — Telehealth: Payer: Self-pay

## 2019-04-25 DIAGNOSIS — I480 Paroxysmal atrial fibrillation: Secondary | ICD-10-CM

## 2019-04-25 DIAGNOSIS — H9313 Tinnitus, bilateral: Secondary | ICD-10-CM | POA: Diagnosis not present

## 2019-04-25 DIAGNOSIS — I10 Essential (primary) hypertension: Secondary | ICD-10-CM

## 2019-04-25 DIAGNOSIS — R42 Dizziness and giddiness: Secondary | ICD-10-CM | POA: Diagnosis not present

## 2019-04-25 DIAGNOSIS — H903 Sensorineural hearing loss, bilateral: Secondary | ICD-10-CM | POA: Diagnosis not present

## 2019-04-25 DIAGNOSIS — E782 Mixed hyperlipidemia: Secondary | ICD-10-CM

## 2019-04-25 DIAGNOSIS — H838X3 Other specified diseases of inner ear, bilateral: Secondary | ICD-10-CM | POA: Diagnosis not present

## 2019-04-25 DIAGNOSIS — H8112 Benign paroxysmal vertigo, left ear: Secondary | ICD-10-CM | POA: Diagnosis not present

## 2019-04-25 DIAGNOSIS — H9202 Otalgia, left ear: Secondary | ICD-10-CM | POA: Diagnosis not present

## 2019-04-25 MED ORDER — DILTIAZEM HCL ER 240 MG PO CP24: 240.0000 mg | ORAL_CAPSULE | Freq: Every day | ORAL | 3 refills | Status: DC

## 2019-04-25 MED ORDER — APIXABAN 5 MG PO TABS: 5.0000 mg | ORAL_TABLET | Freq: Two times a day (BID) | ORAL | 3 refills | Status: DC

## 2019-04-25 NOTE — Patient Instructions (Addendum)
Medication Instructions:  Increase Diltiazem to 240mg  Daily  Start taking 5mg  Eliquis Twice Daily  If you need a refill on your cardiac medications before your next appointment, please call your pharmacy.   Lab work: BMET on 2 Weeks If you have labs (blood work) drawn today and your tests are completely normal, you will receive your results only by: [place] (if you have MyChart) OR A paper copy in the mail If you have any lab test that is abnormal or we need to change your treatment, we will call you to review the results.  Testing/Procedures: NONE  Follow-Up: At [facility], you and your health needs are our priority.  As part of our continuing mission to provide you with exceptional heart care, we have created designated Provider Care Teams.  These Care Teams include your primary Cardiologist (physician) and Advanced Practice Providers (APPs -  Physician Assistants and Nurse Practitioners) who all work together to provide you with the care you need, when you need it. You may see Dr. Gwenlyn Found or one of the following Advanced Practice Providers on your designated Care Team:    Kerin Ransom, PA-C  [place], [place]  Coletta Memos, [place]  Your physician wants you to follow-up in: 1 month with Erasmo Downer in [place] to go over blood pressure log  Your physician wants you to follow-up in: 6 months with Kerin Ransom, PA-C. Your physician wants you to follow-up in: 1 year with Dr. Gwenlyn Found   Any Other Special Instructions Will Be Listed Below (If Applicable). Keep a Blood Pressure Log for 30 days and then go over with Erasmo Downer in 1 month.

## 2019-04-25 NOTE — Progress Notes (Signed)
04/25/2019 [name]   1945-09-21  [id number]  Primary Physician [name], MD Primary Cardiologist: Lorretta Harp MD FACP, [place], [place], [place]  HPI:  [name] is a 74 y.o.  severely overweight, married Caucasian female who was seen by Dr. Aldona Bar in the office on September 08, 2011, for chest pain. I last saw her in the office  01/05/2019.Marland KitchenMarland KitchenHer risk factors include hypertension and hyperlipidemia. She also has GERD. She was admitted for heart cath which I performed radially on September 10, 2011, that was essentially normal with normal LV function. Her 2D echo was normal as well. Her major complaints have been progressive dyspnea on exertion. She does have reactive airway disease and had pneumonia in the recent past as well. 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 She is seeing Dr. Halford Chessman for pulmonary evaluation for her back here for evaluation of sinus tachycardia. She really denies chest pain but does get dyspneic on exertion which was a complaint when I saw her 2 years ago as well. Recent thyroid function tests were normal. An event monitor showed sinus rhythm, sinus tachycardia with episodes of paroxysmal atrial fibrillation. Based on her age, is gender and history of hypertension she would be a candidate for oral anticoagulation. Because of a CHA2DS2VASC score of 3, she was begun on Eiquis oral anticoagulations.She has had bariatric surgery performed by Dr. 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 Alvan Dame. She has since lost 90 pounds and is now ambulatory.she denies chest pain or shortness of breath. She is much more active and has more energy. A30 day event monitor showed sinus rhythm with PACs but no evidence of PAFlast year and therefore I discontinued oral anticoagulation. She recently had a right shoulder replacement by Dr.Norrisand slowly recuperating from  this. She is still suffering from vocal cord dysfunction and episodic dyspnea that we do not think this is cardiovascular in nature.  Since I saw her 3 months ago she is done well.  She did have esophageal stricture stretching earlier this week and was noted to be in atrial fibrillation at that time although currently she is in sinus rhythm.  She is a unaware and asymptomatic of her A. fib.  She has had a oral anticoagulant discontinued in the past but she will need to be restarted on Eliquis oral anticoagulation.  In addition, she is somewhat hypertensive today.  Current Meds  Medication Sig  . acetaminophen (TYLENOL) 500 MG tablet Take 1,000 mg by mouth every 6 (six) hours as needed (for pain.).   [name] albuterol (PROVENTIL HFA;VENTOLIN HFA) 108 (90 Base) MCG/ACT inhaler Inhale 1-2 puffs into the lungs every 6 (six) hours as needed for shortness of breath or wheezing.  [name] ALPRAZolam (XANAX) 0.25 MG tablet Take 0.125 mg by mouth as needed for anxiety.  . Biotin 10000 MCG TABS Take 10,000 mcg by mouth at bedtime.   . cetirizine (ZYRTEC) 10 MG tablet Take 10 mg by mouth daily.   . cholecalciferol (VITAMIN D) 1000 units tablet Take 1 tablet (1,000 Units total) by mouth 2 (two) times daily. (Patient taking differently: Take 2,000 Units by mouth 2 (two) times daily. )  . dicyclomine (BENTYL) 10 MG capsule TAKE 1 CAPSULE BY MOUTH TWICE DAILY AS NEEDED  . DILT-XR 180 MG 24 hr capsule Take 1 capsule by mouth once daily  . DULoxetine (CYMBALTA) 60 MG  capsule Take 60 mg by mouth daily with supper.   . Loperamide HCl (IMODIUM PO) Take 1 tablet by mouth daily.  . Multiple Vitamin (MULTIVITAMIN WITH MINERALS) TABS tablet Take 1 tablet by mouth daily. Centrum   . pantoprazole (PROTONIX) 40 MG tablet Take 1 tablet by mouth once to twice daily  . simvastatin (ZOCOR) 20 MG tablet Take 1 tablet (20 mg total) by mouth daily.  . sucralfate (CARAFATE) 1 g tablet TAKE 1 TABLET BY MOUTH 4 TIMES DAILY WITH MEALS AND AT  BEDTIME.  MAY slowly DISSOLVE TABLET  IN  1  TABLESPOON  OF  WATER  TO  TAKE  AS  A  SLURRY before administration  . traMADol (ULTRAM) 50 MG tablet Take 50 mg by mouth every 6 (six) hours as needed (for pain.).   [name] vitamin B-12 (CYANOCOBALAMIN) 1000 MCG tablet Take 1,000 mcg by mouth daily.     Allergies  Allergen Reactions  . Ativan [Lorazepam] Other (See Comments)    Agitation, aggressive actions, significant mental changes  . Cefdinir Hives    Hives, headache  . Buprenorphine Hcl Itching and Rash  . Fentanyl Itching  . Morphine And Related Itching and Rash    Social History   Socioeconomic History  . Marital status: Married    Spouse name: Jenny Reichmann  . Number of children: 2  . Years of education: Not on file  . Highest education level: Not on file  Occupational History  . Occupation: retired    Fish farm manager: RETIRED  Tobacco Use  . Smoking status: Never Smoker  . Smokeless tobacco: Never Used  Substance and Sexual Activity  . Alcohol use: No  . Drug use: No  . Sexual activity: Yes    Partners: Male  Other Topics Concern  . Not on file  Social History Narrative  . Not on file   Social Determinants of Health   Financial Resource Strain:   . Difficulty of Paying Living Expenses: Not on file  Food Insecurity:   . Worried About Charity fundraiser in the Last Year: Not on file  . Ran Out of Food in the Last Year: Not on file  Transportation Needs:   . Lack of Transportation (Medical): Not on file  . Lack of Transportation (Non-Medical): Not on file  Physical Activity:   . Days of Exercise per Week: Not on file  . Minutes of Exercise per Session: Not on file  Stress:   . Feeling of Stress : Not on file  Social Connections:   . Frequency of Communication with Friends and Family: Not on file  . Frequency of Social Gatherings with Friends and Family: Not on file  . Attends Religious Services: Not on file  . Active Member of Clubs or Organizations: Not on file  . Attends  Archivist Meetings: Not on file  . Marital Status: Not on file  Intimate Partner Violence:   . Fear of Current or Ex-Partner: Not on file  . Emotionally Abused: Not on file  . Physically Abused: Not on file  . Sexually Abused: Not on file     Review of Systems: General: negative for chills, fever, night sweats or weight changes.  Cardiovascular: negative for chest pain, dyspnea on exertion, edema, orthopnea, palpitations, paroxysmal nocturnal dyspnea or shortness of breath Dermatological: negative for rash Respiratory: negative for cough or wheezing Urologic: negative for hematuria Abdominal: negative for nausea, vomiting, diarrhea, bright red blood per rectum, melena, or hematemesis Neurologic: negative for visual  changes, syncope, or dizziness All other systems reviewed and are otherwise negative except as noted above.    Blood pressure (!) 188/74, pulse 94, temperature (!) 97.5 F (36.4 C), height 5\' 6"  (1.676 m), weight 243 lb 6.4 oz (110.4 kg), SpO2 98 %.  General appearance: alert and no distress Neck: no adenopathy, no carotid bruit, no JVD, supple, symmetrical, trachea midline and thyroid not enlarged, symmetric, no tenderness/mass/nodules Lungs: clear to auscultation bilaterally Heart: regular rate and rhythm, S1, S2 normal, no murmur, click, rub or gallop Extremities: extremities normal, atraumatic, no cyanosis or edema Pulses: 2+ and symmetric Skin: Skin color, texture, turgor normal. No rashes or lesions Neurologic: Alert and oriented X 3, normal strength and tone. Normal symmetric reflexes. Normal coordination and gait  EKG sinus rhythm at 94 with left axis deviation.  I personally reviewed this EKG.  ASSESSMENT AND PLAN:   HTN (hypertension) History of essential hypertension with blood pressure measured today at 188/74.  She is on both diltiazem.  She says she checks her blood pressure periodically at home and has been running higher.  I am going to begin  her on losartan 50 mg a day and will have her keep a blood pressure log for the next 30 days.  She will see Kristen back in 4 weeks to review make appropriate changes.  Hyperlipemia History of hyperlipidemia on statin therapy with lipid profile performed 04/20/2019 revealing total cholesterol 193, LDL of 95 and HDL of 82.  Paroxysmal atrial fibrillation History of PAF in the past with a -30-day event monitor and ultimate Lee discontinuation of her oral anticoagulants.  She had recent esophageal stricture stretching earlier this week and she was noted to be in A. fib at that time although she is asymptomatic.  I am going to restart a novel oral anticoagulant.      Lorretta Harp MD FACP,FACC,FAHA, [facility] 04/25/2019 11:53 AM

## 2019-04-25 NOTE — Assessment & Plan Note (Signed)
History of essential hypertension with blood pressure measured today at 188/74.  She is on both diltiazem.  She says she checks her blood pressure periodically at home and has been running higher.  I am going to begin her on losartan 50 mg a day and will have her keep a blood pressure log for the next 30 days.  She will see Kristen back in 4 weeks to review make appropriate changes.

## 2019-04-25 NOTE — Telephone Encounter (Signed)
  Follow up Call-  Call back number 04/23/2019 04/15/2017  Post procedure Call Back phone  # [phone]  Permission to leave phone message Yes Yes  Some recent data might be hidden     Patient questions:  Do you have a fever, pain , or abdominal swelling? No. Pain Score  0 *  Have you tolerated food without any problems? Yes.    Have you been able to return to your normal activities? Yes.    Do you have any questions about your discharge instructions: Diet   No. Medications  No. Follow up visit  No.  Do you have questions or concerns about your Care? No.  Actions: * If pain score is 4 or above: No action needed, pain <4. 1. Have you developed a fever since your procedure? NO  2.   Have you had an respiratory symptoms (SOB or cough) since your procedure? NO  3.   Have you tested positive for COVID 19 since your procedure NO  4.   Have you had any family members/close contacts diagnosed with the COVID 19 since your procedure?  NO   If yes to any of these questions please route to Joylene John, RN and Alphonsa Gin, RN.

## 2019-04-25 NOTE — Assessment & Plan Note (Signed)
History of hyperlipidemia on statin therapy with lipid profile performed 04/20/2019 revealing total cholesterol 193, LDL of 95 and HDL of 82.

## 2019-04-25 NOTE — Assessment & Plan Note (Signed)
History of PAF in the past with a -30-day event monitor and ultimate Lee discontinuation of her oral anticoagulants.  She had recent esophageal stricture stretching earlier this week and she was noted to be in A. fib at that time although she is asymptomatic.  I am going to restart a novel oral anticoagulant.

## 2019-04-26 ENCOUNTER — Telehealth: Payer: Self-pay | Admitting: Cardiovascular Disease

## 2019-04-26 NOTE — Telephone Encounter (Signed)
Pt c/o medication issue:  1. Name of Medication: diltiazem (DILT-XR) 240 MG 24 hr capsule  2. How are you currently taking this medication (dosage and times per day)? Has not started  3. Are you having a reaction (difficulty breathing--STAT)? no  4. What is your medication issue? Patient states she has been taking this medication already at 180mg . She says she was not told to stop taking that medication and has not started the new one. She would like a call back to clarify how she needs to take the medication.

## 2019-04-26 NOTE — Telephone Encounter (Signed)
Spoke with patient. Informed patient Diltiazem had been increased to 240mg  daily and to discontinue previous dosage per AVS.   Patient wanting to speak with MD to better understand her medications and why she is on them. She was not aware she was on blood pressure medication and thought the Diltiazem was to strengthen her heart.   Message routed to primary nurse.

## 2019-04-27 ENCOUNTER — Telehealth: Payer: Self-pay

## 2019-04-27 DIAGNOSIS — I10 Essential (primary) hypertension: Secondary | ICD-10-CM | POA: Diagnosis not present

## 2019-04-27 DIAGNOSIS — I480 Paroxysmal atrial fibrillation: Secondary | ICD-10-CM | POA: Diagnosis not present

## 2019-04-27 DIAGNOSIS — Z1339 Encounter for screening examination for other mental health and behavioral disorders: Secondary | ICD-10-CM | POA: Diagnosis not present

## 2019-04-27 DIAGNOSIS — H8110 Benign paroxysmal vertigo, unspecified ear: Secondary | ICD-10-CM | POA: Diagnosis not present

## 2019-04-27 DIAGNOSIS — E669 Obesity, unspecified: Secondary | ICD-10-CM | POA: Diagnosis not present

## 2019-04-27 DIAGNOSIS — Z1331 Encounter for screening for depression: Secondary | ICD-10-CM | POA: Diagnosis not present

## 2019-04-27 DIAGNOSIS — E785 Hyperlipidemia, unspecified: Secondary | ICD-10-CM | POA: Diagnosis not present

## 2019-04-27 DIAGNOSIS — Z0000 Encounter for general adult medical examination without abnormal findings: Secondary | ICD-10-CM | POA: Diagnosis not present

## 2019-04-27 DIAGNOSIS — M8580 Other specified disorders of bone density and structure, unspecified site: Secondary | ICD-10-CM | POA: Diagnosis not present

## 2019-04-27 DIAGNOSIS — R7301 Impaired fasting glucose: Secondary | ICD-10-CM | POA: Diagnosis not present

## 2019-04-27 DIAGNOSIS — N390 Urinary tract infection, site not specified: Secondary | ICD-10-CM | POA: Diagnosis not present

## 2019-04-27 DIAGNOSIS — J45909 Unspecified asthma, uncomplicated: Secondary | ICD-10-CM | POA: Diagnosis not present

## 2019-04-27 DIAGNOSIS — D6869 Other thrombophilia: Secondary | ICD-10-CM | POA: Diagnosis not present

## 2019-04-27 DIAGNOSIS — F3341 Major depressive disorder, recurrent, in partial remission: Secondary | ICD-10-CM | POA: Diagnosis not present

## 2019-04-27 NOTE — Telephone Encounter (Signed)
Called and LVM for pt to call back .Need to address concerns with BP medication. Pt supposed to be on 50mg  Losartan with 240 Diltiazem.

## 2019-05-01 DIAGNOSIS — R42 Dizziness and giddiness: Secondary | ICD-10-CM | POA: Diagnosis not present

## 2019-05-01 DIAGNOSIS — H8113 Benign paroxysmal vertigo, bilateral: Secondary | ICD-10-CM | POA: Diagnosis not present

## 2019-05-09 DIAGNOSIS — E782 Mixed hyperlipidemia: Secondary | ICD-10-CM | POA: Diagnosis not present

## 2019-05-09 DIAGNOSIS — I480 Paroxysmal atrial fibrillation: Secondary | ICD-10-CM | POA: Diagnosis not present

## 2019-05-09 DIAGNOSIS — I10 Essential (primary) hypertension: Secondary | ICD-10-CM | POA: Diagnosis not present

## 2019-05-09 LAB — BASIC METABOLIC PANEL
BUN/Creatinine Ratio: 18 (ref 12–28)
BUN: 17 mg/dL (ref 8–27)
CO2: 21 mmol/L (ref 20–29)
Calcium: 9.7 mg/dL (ref 8.7–10.3)
Chloride: 108 mmol/L — ABNORMAL HIGH (ref 96–106)
Creatinine, Ser: 0.97 mg/dL (ref 0.57–1.00)
GFR calc Af Amer: 67 mL/min/{1.73_m2} (ref >59)
GFR calc non Af Amer: 59 mL/min/{1.73_m2} — ABNORMAL LOW (ref >59)
Glucose: 96 mg/dL (ref 65–99)
Potassium: 4.4 mmol/L (ref 3.5–5.2)
Sodium: 145 mmol/L — ABNORMAL HIGH (ref 134–144)

## 2019-05-15 DIAGNOSIS — F331 Major depressive disorder, recurrent, moderate: Secondary | ICD-10-CM | POA: Diagnosis not present

## 2019-05-22 ENCOUNTER — Ambulatory Visit: Payer: Medicare Other

## 2019-05-24 ENCOUNTER — Ambulatory Visit: Payer: Medicare Other

## 2019-05-24 ENCOUNTER — Ambulatory Visit: Payer: Medicare Other | Attending: Internal Medicine

## 2019-05-24 DIAGNOSIS — Z23 Encounter for immunization: Secondary | ICD-10-CM

## 2019-05-28 DIAGNOSIS — M25571 Pain in right ankle and joints of right foot: Secondary | ICD-10-CM | POA: Diagnosis not present

## 2019-05-28 DIAGNOSIS — M19071 Primary osteoarthritis, right ankle and foot: Secondary | ICD-10-CM | POA: Diagnosis not present

## 2019-05-28 DIAGNOSIS — Z96661 Presence of right artificial ankle joint: Secondary | ICD-10-CM | POA: Diagnosis not present

## 2019-05-29 ENCOUNTER — Telehealth: Payer: Self-pay | Admitting: Gastroenterology

## 2019-05-29 ENCOUNTER — Other Ambulatory Visit: Payer: Self-pay | Admitting: Gastroenterology

## 2019-05-29 DIAGNOSIS — R1011 Right upper quadrant pain: Secondary | ICD-10-CM

## 2019-05-29 NOTE — Telephone Encounter (Signed)
Called and spoke to pt.  She is having considerable trouble swallowing on occasion and she would like to talk with Dr. Havery Moros about these symptoms. She is frustrated that she was told she had to have an appt before someone would take a message for the Dr.  She confirmed that she is scheduled for an appt with Anderson Malta on Friday but she wants Dr. Loni Muse to know how she is doing.  She doesn't feel any benefit from the EGD with dilation she had last month.  She confirmed she is able to keep liquids down and the "closing up of her throat" only happens occasionally but she is getting really tired of living this way. She would appreciate a call back from Dr. Loni Muse if at all possible.

## 2019-05-29 NOTE — Telephone Encounter (Signed)
I called the patient and we spoke for more than 20 minutes. She states she is having a flare of multiple symptoms.  She is having severe right upper quadrant pain that is bothering her.  She states it feels like pain she has had in the past but this episode seems worse than usual.  No clear triggers.  She had increase her Protonix to twice a day, taking Carafate and taking Bentyl and none of this is helping her at all.  She has a history of cholecystectomy.  She is had multiple CT scans in recent years without a clear cause.  I asked her to come to the office for labs tomorrow morning to make sure lipase, LFTs are okay as well as WBC.  If we can sort this out and she continues to be in severe pain we might need to proceed with a CT scan. She is not having any fevers, tolerating PO, and she doesn't think severe enough to go to the ED at this time.   She otherwise is having episodic severe dysphagia.  I performed an EGD which showed no stenosis, 3 cm hiatal hernia, history of gastric sleeve, empiric dilation has provided no benefit.  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  Its been over 5 years since her last barium study around the time she had a sleeve.  Recommend barium study with tablet to see if we can help localize where her dysphagia is occurring and get a gross sense of motility.  She may need a manometry pending findings.  If she is having esophageal spasm I asked her to try taking a few peppermint altoids at a time to see if this helps at all.    Sherlynn Stalls can you help with the following: - help coordinate barium swallow with tablet I will order her labs for tomorrow AM with further recommendations

## 2019-05-30 ENCOUNTER — Telehealth: Payer: Self-pay

## 2019-05-30 ENCOUNTER — Other Ambulatory Visit: Payer: Self-pay

## 2019-05-30 ENCOUNTER — Other Ambulatory Visit (INDEPENDENT_AMBULATORY_CARE_PROVIDER_SITE_OTHER): Payer: Medicare Other

## 2019-05-30 DIAGNOSIS — R1011 Right upper quadrant pain: Secondary | ICD-10-CM

## 2019-05-30 DIAGNOSIS — R1310 Dysphagia, unspecified: Secondary | ICD-10-CM

## 2019-05-30 DIAGNOSIS — F331 Major depressive disorder, recurrent, moderate: Secondary | ICD-10-CM | POA: Diagnosis not present

## 2019-05-30 LAB — HEPATIC FUNCTION PANEL
ALT: 38 U/L — ABNORMAL HIGH (ref 0–35)
AST: 39 U/L — ABNORMAL HIGH (ref 0–37)
Albumin: 3.8 g/dL (ref 3.5–5.2)
Alkaline Phosphatase: 103 U/L (ref 39–117)
Bilirubin, Direct: 0.1 mg/dL (ref 0.0–0.3)
Total Bilirubin: 0.5 mg/dL (ref 0.2–1.2)
Total Protein: 6.5 g/dL (ref 6.0–8.3)

## 2019-05-30 LAB — CBC WITH DIFFERENTIAL/PLATELET
Basophils Absolute: 0 10*3/uL (ref 0.0–0.1)
Basophils Relative: 0.5 % (ref 0.0–3.0)
Eosinophils Absolute: 0.1 10*3/uL (ref 0.0–0.7)
Eosinophils Relative: 1 % (ref 0.0–5.0)
HCT: 38.4 % (ref 36.0–46.0)
Hemoglobin: 12.8 g/dL (ref 12.0–15.0)
Lymphocytes Relative: 34.9 % (ref 12.0–46.0)
Lymphs Abs: 1.8 10*3/uL (ref 0.7–4.0)
MCHC: 33.3 g/dL (ref 30.0–36.0)
MCV: 92.5 fl (ref 78.0–100.0)
Monocytes Absolute: 0.4 10*3/uL (ref 0.1–1.0)
Monocytes Relative: 7.6 % (ref 3.0–12.0)
Neutro Abs: 2.9 10*3/uL (ref 1.4–7.7)
Neutrophils Relative %: 56 % (ref 43.0–77.0)
Platelets: 176 10*3/uL (ref 150.0–400.0)
RBC: 4.15 Mil/uL (ref 3.87–5.11)
RDW: 12.9 % (ref 11.5–15.5)
WBC: 5.1 10*3/uL (ref 4.0–10.5)

## 2019-05-30 LAB — LIPASE: Lipase: 19 U/L (ref 11.0–59.0)

## 2019-05-30 MED ORDER — PROMETHAZINE HCL 12.5 MG PO TABS: 12.5000 mg | ORAL_TABLET | Freq: Three times a day (TID) | ORAL | 0 refills | Status: DC | PRN

## 2019-05-30 NOTE — Telephone Encounter (Signed)
Patient scheduled for Barium Swallow w/tablet at [facility] on 06/11/19. Patient to arrive at 10:45am and be NPO 3 hours before. Information sent via My Chart, per patient request

## 2019-05-30 NOTE — Telephone Encounter (Signed)
Pt returned your call to schedule.  °

## 2019-05-30 NOTE — Telephone Encounter (Signed)
-----   Message from Yetta Flock, MD sent at 05/30/2019  3:15 PM EST ----- Called patient with lab results.  Essentially normal LFTs, lipase and CBC.  Not sure if she had musculoskeletal pain or spasm.  She used peppermint as we discussed last night and she states it helps.  She is feeling better today.  She is been having these episodes about once a week.  I am recommending she take Bentyl at first onset try the peppermint again, I will also give her some Phenergan to use as needed.  We will await the barium swallow which is scheduled.  We will cancel her appointment with Ellouise Newer on the 12th, I will book her routine follow-up with me, first available opening.  She agreed with this     Jan can you help with the following: -Cancel appointment with Anderson Malta for Friday -Book next routine opening with me in the office -Can you order her Phenergan 12.5 mg 1 tab every 8 hours as needed.  #10 refill 0

## 2019-05-30 NOTE — Telephone Encounter (Signed)
Cancelled appt with Anderson Malta on Friday.  Scheduled pt for next available with [facility] on Monday, 4-19. Sent script for Phenergan 12.5mg  1 tablet every 8 hours as needed, #10 no refills. Patient informed and in agreement.

## 2019-05-30 NOTE — Telephone Encounter (Signed)
See other phone note

## 2019-05-30 NOTE — Telephone Encounter (Signed)
Left message for patient to please call back, trying to schedule Barium Swallow w/tablet

## 2019-06-01 ENCOUNTER — Ambulatory Visit: Payer: Medicare Other | Admitting: Physician Assistant

## 2019-06-05 ENCOUNTER — Other Ambulatory Visit: Payer: Self-pay

## 2019-06-05 ENCOUNTER — Ambulatory Visit (INDEPENDENT_AMBULATORY_CARE_PROVIDER_SITE_OTHER): Payer: Medicare Other | Admitting: Pharmacist Clinician (PhC)/ Clinical Pharmacy Specialist

## 2019-06-05 DIAGNOSIS — I10 Essential (primary) hypertension: Secondary | ICD-10-CM | POA: Diagnosis not present

## 2019-06-05 NOTE — Progress Notes (Signed)
06/05/2019 [name] Nov 23, 1945 [id number]   HPI:  [name] is a 74 y.o. female patient of Dr Gwenlyn Found, with a PMH below who presents today for hypertension clinic evaluation.   She was seen by Dr. Gwenlyn Found last month and found to have a blood pressure of 188/74.  His note mentioned increasing diltiazem from 180 to 240 mg and adding losartan, however there is no mention of losartan in the AVS.  She explained this to her PCP the next day and he started the losartan 50 mg.  She has not had any issues with either.    Today she returns for follow up.  Her biggest concern is an ongoing pain in her abdomen, on the right side.  She previously had her gall bladder removed, but has been having GI issues and pain in that quadrant for awhile now.  She is working with GI to find a cause.  This has her in pain and nauseated on a regular basis, as well as worried about what might be happening.    Past Medical History: hyperlipidemia 2/21: TC 193, TG 82, HDL 82, LDL 95- on simvastatin 20  Atrial fibrillation CHADS2-VASc score 3 (htn, age, female), on Eliquis  Gastric sleeve  2015 -   obesity BMI 39.3     Blood Pressure Goal:  130/80  Current Medications: diltiazem 240 mg, losartan 50 mg  Family Hx: father and mother died at 25 and 45 respectively, both from esophageal cancer, sister died from stroke, had cardiomyopathy.  Has 2 children, not sure if they have any cardiac issues.    Social Hx: no tobacco, alcohol or caffeine  Diet:  Only Propel water or unsweetened tea for liquids; eats out regularly,rare deep fried foods.   Enjoys veggies, only eats occasional deep fried foods.  Had Gastric sleeve in 2015, was 280 lb at time of surgery, now fluctuates between about 220-240 lb.  Was as low as 190 for about 2-3 years.    Exercise: no regular exercise; her church has a walking track, she is thinking about going to use that.   Home BP readings:  15 reading from this month show average of  138/81.  Patient was taking in her recliner chair, no rest time prior to test, feet elevated.    Intolerances: no cardiac medication intolerances  Labs: 2/21:  Na 145, K 4.4, Glu 96, BUN 17, SCr 0.97  Wt Readings from Last 3 Encounters:  04/25/19 243 lb 6.4 oz (110.4 kg)  04/18/19 244 lb 3.2 oz (110.8 kg)  01/05/19 238 lb (108 kg)   BP Readings from Last 3 Encounters:  06/05/19 118/76  04/25/19 (!) 188/74  04/23/19 (!) 145/73   Pulse Readings from Last 3 Encounters:  04/25/19 94  04/23/19 77  04/18/19 70    Current Outpatient Medications  Medication Sig Dispense Refill  . acetaminophen (TYLENOL) 500 MG tablet Take 1,000 mg by mouth every 6 (six) hours as needed (for pain.).     [name] albuterol (PROVENTIL HFA;VENTOLIN HFA) 108 (90 Base) MCG/ACT inhaler Inhale 1-2 puffs into the lungs every 6 (six) hours as needed for shortness of breath or wheezing.    [name] ALPRAZolam (XANAX) 0.25 MG tablet Take 0.125 mg by mouth as needed for anxiety.    [name] apixaban (ELIQUIS) 5 MG TABS tablet Take 1 tablet (5 mg total) by mouth 2 (two) times daily. 180 tablet 3  . Biotin 10000 MCG TABS Take 10,000 mcg by mouth at bedtime.     [name]  cetirizine (ZYRTEC) 10 MG tablet Take 10 mg by mouth daily.     . cholecalciferol (VITAMIN D) 1000 units tablet Take 1 tablet (1,000 Units total) by mouth 2 (two) times daily. (Patient taking differently: Take 2,000 Units by mouth 2 (two) times daily. ) 60 tablet 0  . dicyclomine (BENTYL) 10 MG capsule TAKE 1 CAPSULE BY MOUTH TWICE DAILY AS NEEDED 60 capsule 3  . diltiazem (DILT-XR) 240 MG 24 hr capsule Take 1 capsule (240 mg total) by mouth daily. 90 capsule 3  . Loperamide HCl (IMODIUM PO) Take 1 tablet by mouth daily.    . methocarbamol (ROBAXIN) 500 MG tablet Take 500 mg by mouth every 6 (six) hours as needed for muscle spasms.    . Multiple Vitamin (MULTIVITAMIN WITH MINERALS) TABS tablet Take 1 tablet by mouth daily. Centrum     . pantoprazole (PROTONIX) 40 MG tablet Take 1  tablet by mouth once to twice daily 180 tablet 3  . promethazine (PHENERGAN) 12.5 MG tablet Take 1 tablet (12.5 mg total) by mouth every 8 (eight) hours as needed. 10 tablet 0  . sertraline (ZOLOFT) 50 MG tablet Take 50 mg by mouth daily.    . simvastatin (ZOCOR) 20 MG tablet Take 1 tablet (20 mg total) by mouth daily. 30 tablet 11  . sucralfate (CARAFATE) 1 g tablet TAKE 1 TABLET BY MOUTH 4 TIMES DAILY WITH MEALS AND AT BEDTIME.  MAY slowly DISSOLVE TABLET  IN  1  TABLESPOON  OF  WATER  TO  TAKE  AS  A  SLURRY before administration 120 tablet 2  . traMADol (ULTRAM) 50 MG tablet Take 50 mg by mouth every 6 (six) hours as needed (for pain.).     [name] vitamin B-12 (CYANOCOBALAMIN) 1000 MCG tablet Take 1,000 mcg by mouth daily.     No current facility-administered medications for this visit.    Allergies  Allergen Reactions  . Ativan [Lorazepam] Other (See Comments)    Agitation, aggressive actions, significant mental changes  . Cefdinir Hives    Hives, headache  . Buprenorphine Hcl Itching and Rash  . Fentanyl Itching  . Morphine And Related Itching and Rash    Past Medical History:  Diagnosis Date  . Allergy   . Anemia   . Antral polyp    benign  . Anxiety   . Arthritis   . Asthma   . B12 deficiency   . Back pain   . Chest pain   . Constipation   . Depression   . Diverticulosis   . Gallbladder problem   . GERD (gastroesophageal reflux disease) 09/09/2011  . History of transfusion   . HTN (hypertension) 09/09/2011  . Hyperlipemia 09/09/2011  . Hypertension   . IBS (irritable bowel syndrome)   . Internal hemorrhoid   . Joint pain   . Obesity   . Osteoarthritis   . Paroxysmal atrial fibrillation (HCC)   . PONV (postoperative nausea and vomiting)    with big surgeries, none with endos etc, with colonoscopy- 5-6 years ago could not swallow   . Shortness of breath   . Sleep apnea    borderline per patient no cpap   . SOB (shortness of breath) on exertion   . Swallowing  difficulty   . Tachycardia   . Unstable angina ([place]) 09/09/2011  . Vitamin D deficiency   . Vocal cord dysfunction 2013   swelling    Blood pressure 118/76.  HTN (hypertension) Patient with essential hypertension, well controlled  in the office today.  She will continue with her diltiazem and losartan.  She was taught proper BP technique and will continue to monitor home readings 3-4 times per week.  If her pressure changes significantly, she knows to call the office.  She was given the direct number for CVRR.     Tommy Medal PharmD CPP [facility] [address] [place], [place] [address] [phone]

## 2019-06-05 NOTE — Assessment & Plan Note (Signed)
Patient with essential hypertension, well controlled in the office today.  She will continue with her diltiazem and losartan.  She was taught proper BP technique and will continue to monitor home readings 3-4 times per week.  If her pressure changes significantly, she knows to call the office.  She was given the direct number for CVRR.

## 2019-06-05 NOTE — Patient Instructions (Signed)
  Your blood pressure today is 118/76  Check your blood pressure at home several days a week and keep record of the readings.  Take your BP meds as follows:  Continue with diltiazem and losartan  Bring all of your meds, your BP cuff and your record of home blood pressures to your next appointment.  Exercise as you're able, try to walk approximately 30 minutes per day.  Keep salt intake to a minimum, especially watch canned and prepared boxed foods.  Eat more fresh fruits and vegetables and fewer canned items.  Avoid eating in fast food restaurants.    HOW TO TAKE YOUR BLOOD PRESSURE: . Rest 5 minutes before taking your blood pressure. .  Don't smoke or drink caffeinated beverages for at least 30 minutes before. . Take your blood pressure before (not after) you eat. . Sit comfortably with your back supported and both feet on the floor (don't cross your legs). . Elevate your arm to heart level on a table or a desk. . Use the proper sized cuff. It should fit smoothly and snugly around your bare upper arm. There should be enough room to slip a fingertip under the cuff. The bottom edge of the cuff should be 1 inch above the crease of the elbow. . Ideally, take 3 measurements at one sitting and record the average.

## 2019-06-11 ENCOUNTER — Ambulatory Visit (HOSPITAL_COMMUNITY)
Admission: RE | Admit: 2019-06-11 | Discharge: 2019-06-11 | Disposition: A | Discharge status: Home / Self Care | Payer: Medicare Other | Source: Ambulatory Visit | Attending: Gastroenterology | Admitting: Gastroenterology

## 2019-06-11 ENCOUNTER — Other Ambulatory Visit: Payer: Self-pay

## 2019-06-11 ENCOUNTER — Telehealth: Payer: Self-pay | Admitting: Gastroenterology

## 2019-06-11 DIAGNOSIS — R1310 Dysphagia, unspecified: Secondary | ICD-10-CM

## 2019-06-11 DIAGNOSIS — K224 Dyskinesia of esophagus: Secondary | ICD-10-CM | POA: Diagnosis not present

## 2019-06-11 NOTE — Telephone Encounter (Signed)
Thanks Customer service manager. I will call patient and discuss barium results with her.

## 2019-06-11 NOTE — Telephone Encounter (Signed)
I called patient back and let her know Dr Havery Moros does state in his notes that he would consider a CT, but he is probably waiting to review the Barium Swallow results first. She stated OK, then just have him call me

## 2019-06-13 ENCOUNTER — Telehealth: Payer: Self-pay

## 2019-06-13 ENCOUNTER — Other Ambulatory Visit: Payer: Self-pay

## 2019-06-13 DIAGNOSIS — R109 Unspecified abdominal pain: Secondary | ICD-10-CM

## 2019-06-13 NOTE — Telephone Encounter (Signed)
-----   Message from Yetta Flock, MD sent at 06/12/2019  5:59 PM EDT ----- Regarding: CT scan [facility] Setters can you help coordinate CT scan abdomen / pelvis with contrast for this patient for persistent abdominal pain. Thanks

## 2019-06-13 NOTE — Telephone Encounter (Signed)
Patient notified of the CT scan at [facility] for 06/19/19 1:00.  She verbalizes understanding to pick up her contrast and and instructions.

## 2019-06-19 ENCOUNTER — Ambulatory Visit (HOSPITAL_COMMUNITY)
Admission: RE | Admit: 2019-06-19 | Discharge: 2019-06-19 | Disposition: A | Discharge status: Home / Self Care | Payer: Medicare Other | Source: Ambulatory Visit | Attending: Gastroenterology | Admitting: Gastroenterology

## 2019-06-19 ENCOUNTER — Other Ambulatory Visit: Payer: Self-pay

## 2019-06-19 DIAGNOSIS — K5730 Diverticulosis of large intestine without perforation or abscess without bleeding: Secondary | ICD-10-CM | POA: Diagnosis not present

## 2019-06-19 DIAGNOSIS — R109 Unspecified abdominal pain: Secondary | ICD-10-CM | POA: Diagnosis not present

## 2019-06-19 DIAGNOSIS — K449 Diaphragmatic hernia without obstruction or gangrene: Secondary | ICD-10-CM | POA: Diagnosis not present

## 2019-06-19 MED ORDER — IOHEXOL 300 MG/ML  SOLN
100.0000 mL | Freq: Once | INTRAMUSCULAR | Status: AC | PRN
  Administered 2019-06-19: 100 mL via INTRAVENOUS

## 2019-06-19 MED ORDER — SODIUM CHLORIDE (PF) 0.9 % IJ SOLN
INTRAMUSCULAR | Status: AC
  Filled 2019-06-19: qty 50

## 2019-06-20 ENCOUNTER — Ambulatory Visit: Payer: Medicare Other | Attending: Internal Medicine

## 2019-06-20 ENCOUNTER — Other Ambulatory Visit: Payer: Self-pay | Admitting: Gastroenterology

## 2019-06-20 DIAGNOSIS — Z23 Encounter for immunization: Secondary | ICD-10-CM

## 2019-06-20 NOTE — Progress Notes (Signed)
   Covid-19 Vaccination Clinic  Name:  [name]    MRN: [id number] DOB: 23-Apr-1945  06/20/2019  [name] was observed post Covid-19 immunization for 15 minutes without incident. She was provided with Vaccine Information Sheet and instruction to access the V-Safe system.   [name] was instructed to call 911 with any severe reactions post vaccine: [name] Difficulty breathing  . Swelling of face and throat  . A fast heartbeat  . A bad rash all over body  . Dizziness and weakness   Immunizations Administered    Name Date Dose VIS Date Route   Pfizer COVID-19 Vaccine 06/20/2019 11:54 AM 0.3 mL 03/02/2019 Intramuscular   Manufacturer: [place]   Lot: [id number]   [place]: [id number]

## 2019-07-09 ENCOUNTER — Encounter: Payer: Self-pay | Admitting: Gastroenterology

## 2019-07-09 ENCOUNTER — Ambulatory Visit (INDEPENDENT_AMBULATORY_CARE_PROVIDER_SITE_OTHER): Payer: Medicare Other | Admitting: Gastroenterology

## 2019-07-09 VITALS — BP 118/74 | HR 91 | Temp 98.7°F | Ht 64.0 in | Wt 241.1 lb

## 2019-07-09 DIAGNOSIS — K219 Gastro-esophageal reflux disease without esophagitis: Secondary | ICD-10-CM

## 2019-07-09 DIAGNOSIS — R109 Unspecified abdominal pain: Secondary | ICD-10-CM

## 2019-07-09 DIAGNOSIS — K529 Noninfective gastroenteritis and colitis, unspecified: Secondary | ICD-10-CM

## 2019-07-09 DIAGNOSIS — R112 Nausea with vomiting, unspecified: Secondary | ICD-10-CM | POA: Diagnosis not present

## 2019-07-09 MED ORDER — COLESTIPOL HCL 1 G PO TABS: 1.0000 g | ORAL_TABLET | Freq: Two times a day (BID) | ORAL | 3 refills | Status: DC

## 2019-07-09 MED ORDER — ONDANSETRON 8 MG PO TBDP: 8.0000 mg | ORAL_TABLET | Freq: Three times a day (TID) | ORAL | 1 refills | Status: DC | PRN

## 2019-07-09 MED ORDER — DEXILANT 60 MG PO CPDR: 60.0000 mg | DELAYED_RELEASE_CAPSULE | Freq: Every day | ORAL | 0 refills | Status: DC

## 2019-07-09 NOTE — Progress Notes (Signed)
HPI :  74 year old female here for follow-up visit for nausea / vomiting, abdominal pains, bowel changes.  Patient is here for a follow-up visit for multiple symptoms.  She has had an extensive evaluation for these issues over the years which have been chronic, however she has fluctuations in severity and has been doing quite poorly lately.  She had a difficult time eating lately.  She feels nauseated frequently and has been vomiting perhaps once a day, often in the morning.  She typically has not had much vomiting since her gastric sleeve surgery.  Her appetite is poor but she is not losing weight.  Her voice is weak from the vomiting and she is concerned about her intolerance of food recently.  She had some old Zofran at home and states it helps her.  I gave her Phenergan but she states it did not help her too much.  She has had some early satiety.  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  She takes Protonix 40 mg twice a day and continues to have a lot of reflux symptoms that bother her despite that.  She has been using Carafate as needed that has not helped too much.  She has had intermittent episodes of right upper quadrant pain that radiates to her back.  She states she will feel that there most of the time but does not endorse frequent tenderness there.  She is not sure if there is some positional change or not.  Sometimes eating can make her feel worse, she is not really sure.  She does have lower abdominal pains which respond quite well to Bentyl as needed.  Historically she has had some intermittent loose stools over the years.  Prior colonoscopy in 2019 showed no evidence of colitis or microscopic colitis.  Again Bentyl works well for her cramps when she has them.  She has had some alternating loose stools and constipation in the past week however at baseline she typically has loose stools  with urgency which has been bothering her more.  She has some mucus in the stool but no blood.  She does endorse some bloating that bothers her.  We reviewed her medication list at length.  She asked about supplements that she takes.  She has been on Zoloft for the past year or so, she thinks it is helping some of her anxiety but has not helped any of her bowel symptoms.  She is quite frustrated with her ongoing symptoms.  We reviewed her prior work-up to include multiple CT scans of the abdomen pelvis without clear abnormalities.  She has had colonoscopy in recent years that was normal as evaluation for this as well as upper endoscopy.  She has had negative stool testing for infection in the past.  Pancreatic fecal elastase has been normal.  She has had negative labs for celiac disease in 2014. She has had a remote prior negative gastric emptying study.   CT scan 01/13/2018 - no acute findings  EGD 04/15/2017 - sleeve gastrectomy, otherwise normal exam - biopsies taken to rule out H pylori -  Colonoscopy 04/15/2017 - normal TI, multiple diverticuli, normal exam otherwise - biopsies taken to rule out microscopic colitis   EGD 04/23/19 -  - A 3 cm hiatal hernia was present. - The exam of the esophagus was otherwise normal. - A guidewire was placed and the scope was withdrawn. Dilation was  performed in the entire esophagus with a Savary dilator with mild resistance at 17 mm and 18 mm. Relook endoscopy showed no mucosal wrents. - Evidence of a sleeve gastrectomy was found in the cardia / fundus. This was characterized by healthy appearing mucosa. - The exam of the stomach was otherwise normal. - The duodenal bulb and second portion of the duodenum were normal.   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   CT scan 06/19/19 - IMPRESSION: 1. No acute findings or explanation for the patient's symptoms. 2. Moderate size hiatal  hernia status post gastric sleeve resection. 3. Extensive colonic diverticulosis without evidence of acute inflammation. 4. Stable biliary dilatation, likely physiologic post cholecystectomy. 5. Aortic Atherosclerosis (ICD10-I70.0).    Past Medical History:  Diagnosis Date   Allergy    Anemia    Antral polyp    benign   Anxiety    Arthritis    Asthma    B12 deficiency    Back pain    Chest pain    Constipation    Depression    Diverticulosis    Gallbladder problem    GERD (gastroesophageal reflux disease) 09/09/2011   History of transfusion    HTN (hypertension) 09/09/2011   Hyperlipemia 09/09/2011   Hypertension    IBS (irritable bowel syndrome)    Internal hemorrhoid    Joint pain    Obesity    Osteoarthritis    Paroxysmal atrial fibrillation (HCC)    PONV (postoperative nausea and vomiting)    with big surgeries, none with endos etc, with colonoscopy- 5-6 years ago could not swallow    Shortness of breath    Sleep apnea    borderline per patient no cpap    SOB (shortness of breath) on exertion    Swallowing difficulty    Tachycardia    Unstable angina ([place]) 09/09/2011   Vitamin D deficiency    Vocal cord dysfunction 2013   swelling     Past Surgical History:  Procedure Laterality Date   ABDOMINAL HYSTERECTOMY  1988   ANKLE FUSION Right    x 2   BACK SURGERY  [phone]   3 ruptured discs, lower back   BILATERAL ANTERIOR TOTAL HIP ARTHROPLASTY Bilateral 11/12/2014   Procedure: BILATERAL ANTERIOR TOTAL HIP ARTHROPLASTY;  Surgeon: Paralee Cancel, MD;  Location: [facility];  Service: Orthopedics;  Laterality: Bilateral;   BRAVO [place] STUDY [name] 03/06/2013   Procedure: BRAVO [place] STUDY;  Surgeon: Inda Castle, MD;  Location: [facility];  Service: Endoscopy;  Laterality: [name];   CARDIAC CATHETERIZATION  2013   NORMAL   CHOLECYSTECTOMY     COLONOSCOPY     ELBOW SURGERY Left    removed bone chip    ESOPHAGOGASTRODUODENOSCOPY [name] 03/06/2013   Procedure: ESOPHAGOGASTRODUODENOSCOPY (EGD);  Surgeon: Inda Castle, MD;  Location: [facility];  Service: Endoscopy;  Laterality: [name];   FOOT OSTEOTOMY W/ PLANTAR FASCIA RELEASE Left    ILIOTIBIAL BAND RELEASE Right 1997   JOINT REPLACEMENT     LAPAROSCOPIC GASTRIC SLEEVE RESECTION WITH HIATAL HERNIA REPAIR  08/20/2014   Procedure: LAPAROSCOPIC GASTRIC SLEEVE RESECTION WITH HIATAL HERNIA  REPAIR AND  UPPER ENDOSCOPY;  Surgeon: Johnathan Hausen, MD;  Location: [facility];  Service: General;;   LEFT HEART CATHETERIZATION WITH CORONARY ANGIOGRAM Bilateral 09/10/2011   Procedure: LEFT HEART CATHETERIZATION WITH CORONARY ANGIOGRAM;  Surgeon: Lorretta Harp, MD;  Location: [facility] C[facility] LAB;  Service: Cardiovascular;  Laterality: Bilateral;   NASAL  SINUS SURGERY  1997   REVERSE SHOULDER ARTHROPLASTY Right 05/14/2016   Procedure: RIGHT REVERSE SHOULDER ARTHROPLASTY;  Surgeon: Netta Cedars, MD;  Location: [facility];  Service: Orthopedics;  Laterality: Right;   SHOULDER SURGERY Left    tfc wrist Left 1997   thumb replacement Bilateral    one 2009 and one 2014   TOTAL ANKLE REPLACEMENT Right    x 3   TOTAL ANKLE REPLACEMENT Left    TOTAL ANKLE REPLACEMENT     TOTAL HIP ARTHROPLASTY Bilateral    TOTAL KNEE ARTHROPLASTY Bilateral    left 2002, right 2012   TOTAL SHOULDER ARTHROPLASTY Right 05/14/2016   TRANSTHORACIC ECHOCARDIOGRAM  09/09/2011   MILD CONCENTRIC LVH. TRACE MITRAL REGURG. TRACE TR. MILD AORTIC REGURG.   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   UPPER GI ENDOSCOPY  08/20/2014   Procedure: UPPER GI ENDOSCOPY;  Surgeon: Johnathan Hausen, MD;  Location: [facility];  Service: General;;   WRIST SURGERY Left 1997, 2009    Family History  Problem Relation Age of Onset   Hypertension Mother    Diabetes Mother    Arthritis Mother    Esophageal cancer Mother    Cancer Mother    Esophageal cancer Father 85   Cancer Father     Alcoholism Father    Other Sister        kidney mass removed   Hypertension Sister    Stroke Sister        died in Jan 03, 2019   Heart disease Maternal Grandmother    Stroke Paternal Grandmother    Coronary artery disease Neg Hx    Colon cancer Neg Hx    Liver disease Neg Hx    Kidney disease Neg Hx    Rectal cancer Neg Hx    Stomach cancer Neg Hx    Social History   Tobacco Use   Smoking status: Never Smoker   Smokeless tobacco: Never Used  Substance Use Topics   Alcohol use: No   Drug use: No   Current Outpatient Medications  Medication Sig Dispense Refill   acetaminophen (TYLENOL) 500 MG tablet Take 1,000 mg by mouth every 6 (six) hours as needed (for pain.).      albuterol (PROVENTIL HFA;VENTOLIN HFA) 108 (90 Base) MCG/ACT inhaler Inhale 1-2 puffs into the lungs every 6 (six) hours as needed for shortness of breath or wheezing.     ALPRAZolam (XANAX) 0.25 MG tablet Take 0.125 mg by mouth as needed for anxiety.     apixaban (ELIQUIS) 5 MG TABS tablet Take 1 tablet (5 mg total) by mouth 2 (two) times daily. 180 tablet 3   Biotin 10000 MCG TABS Take 10,000 mcg by mouth at bedtime.      cetirizine (ZYRTEC) 10 MG tablet Take 10 mg by mouth daily.      cholecalciferol (VITAMIN D) 1000 units tablet Take 1 tablet (1,000 Units total) by mouth 2 (two) times daily. (Patient taking differently: Take 2,000 Units by mouth 2 (two) times daily. ) 60 tablet 0   dicyclomine (BENTYL) 10 MG capsule TAKE 1 CAPSULE BY MOUTH TWICE DAILY AS NEEDED 60 capsule 3   diltiazem (DILT-XR) 240 MG 24 hr capsule Take 1 capsule (240 mg total) by mouth daily. 90 capsule 3   Loperamide HCl (IMODIUM PO) Take 1 tablet by mouth daily.     methocarbamol (ROBAXIN) 500 MG tablet Take 500 mg by mouth every 6 (six) hours as needed for muscle spasms.  Multiple Vitamin (MULTIVITAMIN WITH MINERALS) TABS tablet Take 1 tablet by mouth daily. Centrum      pantoprazole (PROTONIX) 40 MG tablet  Take 1 tablet by mouth once to twice daily 180 tablet 3   promethazine (PHENERGAN) 12.5 MG tablet TAKE 1 TABLET BY MOUTH EVERY 8 HOURS AS NEEDED 10 tablet 0   sertraline (ZOLOFT) 50 MG tablet Take 50 mg by mouth daily.     simvastatin (ZOCOR) 20 MG tablet Take 1 tablet (20 mg total) by mouth daily. 30 tablet 11   sucralfate (CARAFATE) 1 g tablet TAKE 1 TABLET BY MOUTH 4 TIMES DAILY WITH MEALS AND AT BEDTIME.  MAY slowly DISSOLVE TABLET  IN  1  TABLESPOON  OF  WATER  TO  TAKE  AS  A  SLURRY before administration 120 tablet 2   traMADol (ULTRAM) 50 MG tablet Take 50 mg by mouth every 6 (six) hours as needed (for pain.).      vitamin B-12 (CYANOCOBALAMIN) 1000 MCG tablet Take 1,000 mcg by mouth daily.     No current facility-administered medications for this visit.   Allergies  Allergen Reactions   Ativan [Lorazepam] Other (See Comments)    Agitation, aggressive actions, significant mental changes   Cefdinir Hives    Hives, headache   Buprenorphine Hcl Itching and Rash   Fentanyl Itching   Morphine And Related Itching and Rash     Review of Systems: All systems reviewed and negative except where noted in HPI.    CT Abdomen Pelvis W Contrast  Result Date: 06/19/2019 CLINICAL DATA:  Right upper quadrant abdominal pain radiating into the right flank 4 months. History of gastric sleeve resection, appendectomy and cholecystectomy. EXAM: CT ABDOMEN AND PELVIS WITH CONTRAST TECHNIQUE: Multidetector CT imaging of the abdomen and pelvis was performed using the standard protocol following bolus administration of intravenous contrast. CONTRAST:  123mL OMNIPAQUE IOHEXOL 300 MG/ML  SOLN COMPARISON:  Abdominopelvic CT 01/13/2018. FINDINGS: Lower chest: Clear lung bases. No significant pleural or pericardial effusion. There is a moderate size hiatal hernia which has mildly enlarged compared with the prior study. Hepatobiliary: The liver is normal in density without suspicious focal abnormality.  Moderate intra and extrahepatic biliary dilatation is similar to the previous study and likely physiologic post cholecystectomy. Pancreas: Unremarkable. No pancreatic ductal dilatation or surrounding inflammatory changes. Spleen: Normal in size without focal abnormality. Adrenals/Urinary Tract: Stable calcifications within the right adrenal gland. No adrenal mass. Stable small cyst in the upper pole of the left kidney. No evidence of renal mass, urinary tract calculus or hydronephrosis. The bladder is largely obscured by artifact from the hip prostheses, although demonstrates no gross abnormality. Stomach/Bowel: Stable postsurgical changes within the stomach consistent with prior gastric sleeve resection. As above, there is a moderate size hiatal hernia which has mildly enlarged. No small bowel abnormalities are identified. There are diverticular changes throughout the colon, fairly extensive in the descending and sigmoid colon. No apparent focal bowel wall thickening or surrounding inflammation. No evidence of bowel obstruction. Vascular/Lymphatic: There are no enlarged abdominal or pelvic lymph nodes. Aortic and branch vessel atherosclerosis without acute vascular findings. The portal, superior mesenteric and splenic veins are patent. Reproductive: Hysterectomy. No evidence of adnexal mass. The lower pelvis is partly obscured by artifact from the hip prostheses. Other: No evidence of abdominal wall mass or hernia. No ascites. Musculoskeletal: No acute or significant osseous findings. Status post bilateral total hip arthroplasty. There are degenerative changes throughout the spine associated with a convex right  scoliosis. Stable chronic atrophy of the right iliopsoas muscle. IMPRESSION: 1. No acute findings or explanation for the patient's symptoms. 2. Moderate size hiatal hernia status post gastric sleeve resection. 3. Extensive colonic diverticulosis without evidence of acute inflammation. 4. Stable biliary  dilatation, likely physiologic post cholecystectomy. 5. Aortic Atherosclerosis (ICD10-I70.0). Electronically Signed   By: Richardean Sale M.D.   On: 06/19/2019 16:56   DG ESOPHAGUS W DOUBLE CM (HD)  Result Date: 06/11/2019 CLINICAL DATA:  Epigastric pain, difficulty swallowing, prior gastric sleeve EXAM: ESOPHOGRAM/BARIUM SWALLOW TECHNIQUE: Single contrast examination was performed using  thin barium. FLUOROSCOPY TIME:  Fluoroscopy Time:  2 minutes 18 seconds Radiation Exposure Index (if provided by the fluoroscopic device): 91.2 mGy Number of Acquired Spot Images: 12 COMPARISON:  CT abdomen/pelvis dated 01/13/2018. Upper GI dated 01/14/2015. FINDINGS: Moderate esophageal dysmotility. Scattered tertiary contractions with suspected intermittent esophageal spasm. No fixed esophageal narrowing or stricture. However, a 13 mm barium tablet was not administered. No evidence of hiatal hernia. 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 Gastric folds are suboptimally distended and poorly evaluated but grossly unremarkable. Proximal duodenum is nondilated. Right shoulder arthroplasty.  Cholecystectomy clips. 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 No fixed esophageal narrowing or stricture. 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 No evidence of hiatal hernia. Electronically Signed   By: Julian Hy M.D.   On: 06/11/2019 11:46    Physical Exam: BP 118/74    Pulse 91    Temp 98.7 F (37.1 C)    Ht 5\' 4"  (1.626 m)    Wt 241 lb 2 oz (109.4 kg)    BMI 41.39 kg/m  Constitutional: Pleasant,well-developed, female in no acute distress.. Abdominal: Soft, nondistended, some tenderness to light touch in the RUQ. There are no masses palpable.  Extremities: no edema Lymphadenopathy: No cervical adenopathy noted. Neurological: Alert and oriented to person place and time. Skin: Skin is warm and dry. No rashes noted. Psychiatric: Normal mood and affect.  Behavior is normal.   ASSESSMENT AND PLAN: 74 year old female here for reassessment of multiple symptoms as outlined:  Nausea / vomiting / GERD - not eating well, recent EGD did not show any concerning findings.  She is had a prior gastric emptying study which was normal, although things could have changed and she developed gastroparesis.  She states Protonix is not working well at all for her, not sure if she is having nonacid reflux causing the symptoms, but I offered her an empiric trial of Dexilant 60 mg a day and will hold the Protonix right now.  She does respond well to Zofran and will give her a milligrams to use every 6 hours as needed, I recommend she take this at least once or twice daily at baseline to see if she will help her p.o. intake.  I am going to recheck her labs to ensure stable in light of all of her symptoms.  We will check a fasting a.m. cortisol and may consider ACTH stim test pending the results.  We will also check baseline TSH and repeat her LFTs to ensure okay.  She was agreeable with this plan.  If her upper tract symptoms do not improve on this regimen we discussed either repeating gastric emptying study or trying her on a low-dose of Reglan empirically.  She wants to hold off on Reglan right now but will consider this if her symptoms persist.  She will contact me in a few weeks for reassessment.  Chronic diarrhea - chronic loose stools  with urgency.  Negative for microscopic colitis, pancreatic fecal elastase normal.  She does have history of cholecystectomy.  I discussed options and given her postprandial urgency we will start her on Colestid 1 g twice a day to see if that will reduce her urgency.  If she does well with this and we can get her bowels better controlled she may be more willing to try Reglan for her upper tract symptoms.  The other issue to be addressed is her ongoing Zoloft use.  She has been on this for the past year and that can increase the risk for  microscopic colitis, I would like to avoid putting her through another colonoscopy to assess for this but if symptoms persist we may consider it.  I am going to reach out to Dr. Brigitte Pulse to see if we can perhaps switch her Zoloft to another regimen such as Cymbalta in light of her abdominal pain.  Abdominal pain - I am not sure if this is musculoskeletal/neuropathic in etiology versus functional.  I reassured her her imaging and labs otherwise look okay.  We will rule out adrenal insufficiency as outlined.  I think switching her over to Cymbalta for chronic pain might be a reasonable idea.  I will consider trigger point injection as well pending her course although symptoms not classic for abdominal wall.  I spent 45 minutes of time, including in depth chart review, independent review of results as outlined above, communicating results with the patient directly, face-to-face time with the patient, coordinating care, ordering studies and medications as appropriate, and documenting this encounter.  [place] Cellar, MD [facility]astro[facility]

## 2019-07-09 NOTE — Patient Instructions (Addendum)
If you are age 73 or older, your body mass index should be between 23-30. Your Body mass index is 41.39 kg/m. If this is out of the aforementioned range listed, please consider follow up with your Primary Care Provider.  If you are age 55 or younger, your body mass index should be between 19-25. Your Body mass index is 41.39 kg/m. If this is out of the aformentioned range listed, please consider follow up with your Primary Care Provider.   Discontinue your Protonix, phenergan and biotin.  We have sent the following medications to your pharmacy for you to pick up at your convenience: Zofran 8 mg ODT: Take every 8 hours as needed. Take at least once a day Colestid 1 g: Take twice a day.  Speak to the pharmacist about the timing on taking this medication with your other medications.  We are giving you samples of Dexilant 60mg : Take once a day.  Let us know how this works for you.  Please go to the lab in the basement of our building to have FASTING lab work done in the morning. Hit "B" for basement when you get on the elevator.  When the doors open the lab is on your left.  We will call you with the results. Thank you.  We have scheduled you for a follow up appointment on Tuesday, 08-14-19 at 1:40pm.   Thank you for entrusting me with your care and for choosing [facility], Dr. [place] Cellar

## 2019-07-10 ENCOUNTER — Other Ambulatory Visit (INDEPENDENT_AMBULATORY_CARE_PROVIDER_SITE_OTHER): Payer: Medicare Other

## 2019-07-10 DIAGNOSIS — R112 Nausea with vomiting, unspecified: Secondary | ICD-10-CM

## 2019-07-10 DIAGNOSIS — K219 Gastro-esophageal reflux disease without esophagitis: Secondary | ICD-10-CM | POA: Diagnosis not present

## 2019-07-10 DIAGNOSIS — R109 Unspecified abdominal pain: Secondary | ICD-10-CM | POA: Diagnosis not present

## 2019-07-10 DIAGNOSIS — K529 Noninfective gastroenteritis and colitis, unspecified: Secondary | ICD-10-CM | POA: Diagnosis not present

## 2019-07-10 LAB — CORTISOL: Cortisol, Plasma: 24.8 ug/dL

## 2019-07-10 LAB — TSH: TSH: 2.58 u[IU]/mL (ref 0.35–4.50)

## 2019-07-10 LAB — HEPATIC FUNCTION PANEL
ALT: 17 U/L (ref 0–35)
AST: 23 U/L (ref 0–37)
Albumin: 4 g/dL (ref 3.5–5.2)
Alkaline Phosphatase: 91 U/L (ref 39–117)
Bilirubin, Direct: 0.1 mg/dL (ref 0.0–0.3)
Total Bilirubin: 0.5 mg/dL (ref 0.2–1.2)
Total Protein: 6.5 g/dL (ref 6.0–8.3)

## 2019-07-20 ENCOUNTER — Telehealth: Payer: Self-pay | Admitting: Gastroenterology

## 2019-07-20 MED ORDER — COLESTIPOL HCL 1 G PO TABS: 1.0000 g | ORAL_TABLET | Freq: Every day | ORAL | 3 refills | Status: DC

## 2019-07-20 MED ORDER — DEXILANT 60 MG PO CPDR: 60.0000 mg | DELAYED_RELEASE_CAPSULE | Freq: Every day | ORAL | 3 refills | Status: DC

## 2019-07-20 NOTE — Telephone Encounter (Signed)
Called and spoke to patient. Updated med list. Relayed recommendations.  She indicated that the Dexilant is very expensive. She will reach out to her pharmacy and insurance company.  I will be on the look out for the PA request and complete if necessary.

## 2019-07-20 NOTE — Telephone Encounter (Signed)
Pt reported that she is doing well with the recommendations by Dr. Havery Moros.  She stated that she has finished Dexilant samples and requested a prescription sent to [facility] on [address].  She is experiencing constipation and would like to discuss.

## 2019-07-20 NOTE — Telephone Encounter (Signed)
Thanks Jan. The last time I saw her she complained of diarrhea with urgency and I put her on colestid. I'm assuming the colestid has helped that. If she is still taking colestid and she thinks has constipated her, can reduce it to once per day and see how things go, or just use it PRN. If she does not want to take miralax can try some dulcolax PRN. Thanks

## 2019-07-20 NOTE — Telephone Encounter (Signed)
Send prescription of Dexilant to pharmacy. Called and spoke to patient.  Recommended Miralax for constipation but she is completely adverse to any liquid medication even if she can't taste it or feel in in the liquid.  She has tried senokot for the constipation and it has not helped. She has not had a BM for 4 days. She would like to know if there is a pill she can try.  She also wanted you to know she is feeling much, much better! She and her husband are thrilled with how much better she feels since she saw you in the Office last.  She thanks you.  Once she can resolve the constipation she will be very happy.

## 2019-07-20 NOTE — Telephone Encounter (Signed)
Called and spoke to pt.  See phone note from today

## 2019-07-23 NOTE — Telephone Encounter (Signed)
Well, glad to hear the [name] worked but sorry it is so expensive. Not sure if she wants to shop around at another pharmacy to see if she can get a better rate if it works for her. Otherwise, other options would be nexium 40mg  BID or aciphex 40mg  BID, if any of those are cheaper can try that - of those 2 options would think nexium is better covered. Thanks

## 2019-07-23 NOTE — Telephone Encounter (Signed)
Pt stated that she is out of Dexilant and would like to discuss.

## 2019-07-23 NOTE — Telephone Encounter (Signed)
Patient reports she is better and the dulcolax worked. [place] regarding [place]. Her copay is $158.  Patient has tried Protonix bid and omeprazole bid. She is out of Dexilant and wants to know what else she can try.

## 2019-07-24 MED ORDER — ESOMEPRAZOLE MAGNESIUM 40 MG PO CPDR: 40.0000 mg | DELAYED_RELEASE_CAPSULE | Freq: Two times a day (BID) | ORAL | 2 refills | Status: DC

## 2019-07-24 NOTE — Addendum Note (Signed)
Addended by: Roetta Sessions on: 07/24/2019 08:24 AM   Modules accepted: Orders

## 2019-07-24 NOTE — Telephone Encounter (Signed)
Sent Rx for nexium bid to pt's pharmacy.

## 2019-07-24 NOTE — Telephone Encounter (Signed)
Called and spoke to pt.  She will try the nexium and let us know how she does. She said the pharmacy indicated it would be $15, great news.

## 2019-08-14 ENCOUNTER — Ambulatory Visit (INDEPENDENT_AMBULATORY_CARE_PROVIDER_SITE_OTHER): Payer: Medicare Other | Admitting: Gastroenterology

## 2019-08-14 ENCOUNTER — Encounter: Payer: Self-pay | Admitting: Gastroenterology

## 2019-08-14 ENCOUNTER — Other Ambulatory Visit (INDEPENDENT_AMBULATORY_CARE_PROVIDER_SITE_OTHER): Payer: Medicare Other

## 2019-08-14 VITALS — BP 118/76 | HR 88 | Ht 64.0 in | Wt 243.0 lb

## 2019-08-14 DIAGNOSIS — K219 Gastro-esophageal reflux disease without esophagitis: Secondary | ICD-10-CM | POA: Diagnosis not present

## 2019-08-14 DIAGNOSIS — R194 Change in bowel habit: Secondary | ICD-10-CM

## 2019-08-14 DIAGNOSIS — R109 Unspecified abdominal pain: Secondary | ICD-10-CM | POA: Diagnosis not present

## 2019-08-14 LAB — CBC WITH DIFFERENTIAL/PLATELET
Basophils Absolute: 0 10*3/uL (ref 0.0–0.1)
Basophils Relative: 0.6 % (ref 0.0–3.0)
Eosinophils Absolute: 0.1 10*3/uL (ref 0.0–0.7)
Eosinophils Relative: 1 % (ref 0.0–5.0)
HCT: 38.2 % (ref 36.0–46.0)
Hemoglobin: 12.9 g/dL (ref 12.0–15.0)
Lymphocytes Relative: 34 % (ref 12.0–46.0)
Lymphs Abs: 2.2 10*3/uL (ref 0.7–4.0)
MCHC: 33.7 g/dL (ref 30.0–36.0)
MCV: 91.7 fl (ref 78.0–100.0)
Monocytes Absolute: 0.5 10*3/uL (ref 0.1–1.0)
Monocytes Relative: 7.3 % (ref 3.0–12.0)
Neutro Abs: 3.6 10*3/uL (ref 1.4–7.7)
Neutrophils Relative %: 57.1 % (ref 43.0–77.0)
Platelets: 200 10*3/uL (ref 150.0–400.0)
RBC: 4.16 Mil/uL (ref 3.87–5.11)
RDW: 12.7 % (ref 11.5–15.5)
WBC: 6.4 10*3/uL (ref 4.0–10.5)

## 2019-08-14 MED ORDER — DICYCLOMINE HCL 10 MG PO CAPS: 10.0000 mg | ORAL_CAPSULE | Freq: Three times a day (TID) | ORAL | 3 refills | Status: DC

## 2019-08-14 NOTE — Progress Notes (Signed)
HPI :  74 year old female here for follow-up visit for nausea / vomiting, abdominal pains, bowel changes.  I have seen her a few times this year for multiple symptoms.  She has had an extensive evaluation for these issues over the years, most of that have been chronic with fluctuations in severity.  Please see last clinic note for details of her symptoms at that time.  Overall since have last seen her she states she is " overwhelmingly better" but still has some issues that are bothering her  We gave her a trial of Dexilant 60 mg a day at the last visit which provided significant benefit to her reflux and upper tract symptoms.  Unfortunately she cannot afford the Dexilant though she was switched to Nexium 40 mg twice daily.  She states she has been taking this dosed at once a day and it continued to provide some benefit and she is doing okay in that regard.    She had had some persistent loose stools over the years.  She had a prior colonoscopy in 2019 which showed no evidence of colitis or microscopic colitis.  I gave her a trial of Colestid given her history of cholecystectomy and that actually caused constipation.  She had a reduce the dose to once a day.  She is having about 1-2 bowel movements a day, states her bowel frequency is better on this regimen however she has been having some mucus in her stool that at times may leak out of her rectum at times with small amount of blood which is been going on for the past week.  This is rather new for her.  No perianal pain or discomfort.  The other main issue that she has been complaining of over the past few weeks is left lower quadrant pain which she thinks is rather new for her.  States that some mild nagging type of discomfort which is mostly constant.  No correlation with her bowel movements.  She denies any recent antibiotics or sick contacts.  Her vaccines are up-to-date.  Her prior right upper quadrant discomfort was thought to potentially be  musculoskeletal.  She states that comes and goes and is mild and not as bad as it was before. She has been using Bentyl twice daily scheduled for her prior abdominal cramps and states that has been helping.  We also had switched her Zoloft to Cymbalta, in general her condition is better but main issue appears to be left lower quadrant pain and mucus in the stool as outlined.  We reviewed her prior work-up to include multiple CT scans of the abdomen pelvis without clear abnormalities over the years.  She has had colonoscopy in recent years that was normal as evaluation for this as well as upper endoscopy.  She has had negative stool testing for infection in the past.  Pancreatic fecal elastase has been normal.  She has had negative labs for celiac disease in 2014. She has had a remote prior negative gastric emptying study.  Prior workup: CT scan 01/13/2018 - no acute findings  EGD 04/15/2017 - sleeve gastrectomy, otherwise normal exam - biopsies taken to rule out H pylori -   Colonoscopy 04/15/2017 - normal TI, multiple diverticuli, normal exam otherwise - biopsies taken to rule out microscopic colitis  EGD 04/23/19 -  - A 3 cm hiatal hernia was present. - The exam of the esophagus was otherwise normal. - A guidewire was placed and the scope was withdrawn. Dilation was performed in  the entire esophagus with a Savary dilator with mild resistance at 17 mm and 18 mm. Relook endoscopy showed no mucosal wrents. - Evidence of a sleeve gastrectomy was found in the cardia / fundus. This was characterized by healthy appearing mucosa. - The exam of the stomach was otherwise normal. - The duodenal bulb and second portion of the duodenum were normal.   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   CT scan 06/19/19 - IMPRESSION: 1. No acute findings or explanation for the patient's symptoms. 2. Moderate size hiatal hernia status post  gastric sleeve resection. 3. Extensive colonic diverticulosis without evidence of acute inflammation. 4. Stable biliary dilatation, likely physiologic post cholecystectomy. 5. Aortic Atherosclerosis (ICD10-I70.0).    Past Medical History:  Diagnosis Date  . Allergy   . Anemia   . Antral polyp    benign  . Anxiety   . Arthritis   . Asthma   . B12 deficiency   . Back pain   . Chest pain   . Constipation   . Depression   . Diverticulosis   . Gallbladder problem   . GERD (gastroesophageal reflux disease) 09/09/2011  . History of transfusion   . HTN (hypertension) 09/09/2011  . Hyperlipemia 09/09/2011  . Hypertension   . IBS (irritable bowel syndrome)   . Internal hemorrhoid   . Joint pain   . Obesity   . Osteoarthritis   . Paroxysmal atrial fibrillation (HCC)   . PONV (postoperative nausea and vomiting)    with big surgeries, none with endos etc, with colonoscopy- 5-6 years ago could not swallow   . Shortness of breath   . Sleep apnea    borderline per patient no cpap   . SOB (shortness of breath) on exertion   . Swallowing difficulty   . Tachycardia   . Unstable angina ([place]) 09/09/2011  . Vitamin D deficiency   . Vocal cord dysfunction 2013   swelling     Past Surgical History:  Procedure Laterality Date  . ABDOMINAL HYSTERECTOMY  1988  . ANKLE FUSION Right    x 2  . BACK SURGERY  [phone]   3 ruptured discs, lower back  . BILATERAL ANTERIOR TOTAL HIP ARTHROPLASTY Bilateral 11/12/2014   Procedure: BILATERAL ANTERIOR TOTAL HIP ARTHROPLASTY;  Surgeon: Paralee Cancel, MD;  Location: [facility];  Service: Orthopedics;  Laterality: Bilateral;  . BRAVO [place] STUDY [name] 03/06/2013   Procedure: BRAVO [place] STUDY;  Surgeon: Inda Castle, MD;  Location: [facility];  Service: Endoscopy;  Laterality: [name];  . CARDIAC CATHETERIZATION  2013   NORMAL  . CHOLECYSTECTOMY    . COLONOSCOPY    . ELBOW SURGERY Left    removed bone chip  . ESOPHAGOGASTRODUODENOSCOPY [name] 03/06/2013    Procedure: ESOPHAGOGASTRODUODENOSCOPY (EGD);  Surgeon: Inda Castle, MD;  Location: [facility];  Service: Endoscopy;  Laterality: [name];  . FOOT OSTEOTOMY W/ PLANTAR FASCIA RELEASE Left   . ILIOTIBIAL BAND RELEASE Right 1997  . JOINT REPLACEMENT    . LAPAROSCOPIC GASTRIC SLEEVE RESECTION WITH HIATAL HERNIA REPAIR  08/20/2014   Procedure: LAPAROSCOPIC GASTRIC SLEEVE RESECTION WITH HIATAL HERNIA  REPAIR AND  UPPER ENDOSCOPY;  Surgeon: Johnathan Hausen, MD;  Location: [facility];  Service: General;;  . LEFT HEART CATHETERIZATION WITH CORONARY ANGIOGRAM Bilateral 09/10/2011   Procedure: LEFT HEART CATHETERIZATION WITH CORONARY ANGIOGRAM;  Surgeon: Lorretta Harp, MD;  [facility] C[facility];  Service: Cardiovascular;  Laterality: Bilateral;  . NASAL SINUS SURGERY  [place] Right 05/14/2016   Procedure: RIGHT REVERSE SHOULDER ARTHROPLASTY;  Surgeon: Netta Cedars, MD;  Location: [place];  Service: Orthopedics;  Laterality: Right;  . SHOULDER SURGERY Left   . tfc wrist Left 1997  . thumb replacement Bilateral    one 2009 and one 2014  . TOTAL ANKLE REPLACEMENT Right    x 3  . TOTAL ANKLE REPLACEMENT Left   . TOTAL ANKLE REPLACEMENT    . TOTAL HIP ARTHROPLASTY Bilateral   . TOTAL KNEE ARTHROPLASTY Bilateral    left 2002, right 2012  . TOTAL SHOULDER ARTHROPLASTY Right 05/14/2016  . TRANSTHORACIC ECHOCARDIOGRAM  09/09/2011   MILD CONCENTRIC LVH. TRACE MITRAL REGURG. TRACE TR. MILD AORTIC REGURG.  . 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 UPPER GI ENDOSCOPY  08/20/2014   Procedure: UPPER GI ENDOSCOPY;  Surgeon: Johnathan Hausen, MD;  Location: [facility];  Service: General;;  . WRIST SURGERY Left 1997, 2009    Family History  Problem Relation Age of Onset  . Hypertension Mother   . Diabetes Mother   . Arthritis Mother   . Esophageal cancer Mother   . Cancer Mother   . Esophageal cancer Father 63  . Cancer Father   . Alcoholism Father   . Other Sister         kidney mass removed  . Hypertension Sister   . Stroke Sister        died in 30-Dec-2018  . Heart disease Maternal Grandmother   . Stroke Paternal Grandmother   . Coronary artery disease Neg Hx   . Colon cancer Neg Hx   . Liver disease Neg Hx   . Kidney disease Neg Hx   . Rectal cancer Neg Hx   . Stomach cancer Neg Hx    Social History   Tobacco Use  . Smoking status: Never Smoker  . Smokeless tobacco: Never Used  Substance Use Topics  . Alcohol use: No  . Drug use: No   Current Outpatient Medications  Medication Sig Dispense Refill  . acetaminophen (TYLENOL) 500 MG tablet Take 1,000 mg by mouth every 6 (six) hours as needed (for pain.).     [name] albuterol (PROVENTIL HFA;VENTOLIN HFA) 108 (90 Base) MCG/ACT inhaler Inhale 1-2 puffs into the lungs every 6 (six) hours as needed for shortness of breath or wheezing.    [name] ALPRAZolam (XANAX) 0.25 MG tablet Take 0.125 mg by mouth as needed for anxiety.    [name] apixaban (ELIQUIS) 5 MG TABS tablet Take 1 tablet (5 mg total) by mouth 2 (two) times daily. 180 tablet 3  . cetirizine (ZYRTEC) 10 MG tablet Take 10 mg by mouth daily.     . cholecalciferol (VITAMIN D) 1000 units tablet Take 1 tablet (1,000 Units total) by mouth 2 (two) times daily. (Patient taking differently: Take 2,000 Units by mouth 2 (two) times daily. ) 60 tablet 0  . colestipol (COLESTID) 1 g tablet Take 1 tablet (1 g total) by mouth daily. 60 tablet 3  . dicyclomine (BENTYL) 10 MG capsule TAKE 1 CAPSULE BY MOUTH TWICE DAILY AS NEEDED 60 capsule 3  . diltiazem (DILT-XR) 240 MG 24 hr capsule Take 1 capsule (240 mg total) by mouth daily. 90 capsule 3  . esomeprazole (NEXIUM) 40 MG capsule Take 1 capsule (40 mg total) by mouth 2 (two) times daily before a meal. 60 capsule 2  . Loperamide HCl (IMODIUM PO) Take 1 tablet by mouth  daily.    . methocarbamol (ROBAXIN) 500 MG tablet Take 500 mg by mouth every 6 (six) hours as needed for muscle spasms.    . Multiple Vitamin (MULTIVITAMIN  WITH MINERALS) TABS tablet Take 1 tablet by mouth daily. Centrum     . ondansetron (ZOFRAN ODT) 8 MG disintegrating tablet Take 1 tablet (8 mg total) by mouth every 8 (eight) hours as needed for nausea or vomiting. Take at least once a day 90 tablet 1  . sertraline (ZOLOFT) 50 MG tablet Take 50 mg by mouth daily.    . simvastatin (ZOCOR) 20 MG tablet Take 1 tablet (20 mg total) by mouth daily. 30 tablet 11  . sucralfate (CARAFATE) 1 g tablet TAKE 1 TABLET BY MOUTH 4 TIMES DAILY WITH MEALS AND AT BEDTIME.  MAY slowly DISSOLVE TABLET  IN  1  TABLESPOON  OF  WATER  TO  TAKE  AS  A  SLURRY before administration 120 tablet 2  . traMADol (ULTRAM) 50 MG tablet Take 50 mg by mouth every 6 (six) hours as needed (for pain.).     [name] vitamin B-12 (CYANOCOBALAMIN) 1000 MCG tablet Take 1,000 mcg by mouth daily.     No current facility-administered medications for this visit.   Allergies  Allergen Reactions  . Ativan [Lorazepam] Other (See Comments)    Agitation, aggressive actions, significant mental changes  . Cefdinir Hives    Hives, headache  . Buprenorphine Hcl Itching and Rash  . Fentanyl Itching  . Morphine And Related Itching and Rash     Review of Systems: All systems reviewed and negative except where noted in HPI.   CBC Latest Ref Rng & Units 08/14/2019 05/30/2019 03/04/2017  WBC 4.0 - 10.5 K/uL 6.4 5.1 4.2  Hemoglobin 12.0 - 15.0 g/dL 12.9 12.8 12.7  Hematocrit 36.0 - 46.0 % 38.2 38.4 39.0  Platelets 150.0 - 400.0 K/uL 200.0 176.0 183.0    Lab Results  Component Value Date   ALT 17 07/10/2019   AST 23 07/10/2019   ALKPHOS 91 07/10/2019   BILITOT 0.5 07/10/2019    Lab Results  Component Value Date   CREATININE 0.97 05/09/2019   BUN 17 05/09/2019   NA 145 (H) 05/09/2019   K 4.4 05/09/2019   CL 108 (H) 05/09/2019   CO2 21 05/09/2019    Lab Results  Component Value Date   ALT 17 07/10/2019   AST 23 07/10/2019   ALKPHOS 91 07/10/2019   BILITOT 0.5 07/10/2019      Physical Exam: BP 118/76   Pulse 88   Ht 5\' 4"  (1.626 m)   Wt 243 lb (110.2 kg)   BMI 41.71 kg/m  Constitutional: Pleasant,well-developed, female in no acute distress. Abdominal: Soft, nondistended, mild LLQ TTP. Extremities: no edema Lymphadenopathy: No cervical adenopathy noted. Neurological: Alert and oriented to person place and time. Skin: Skin is warm and dry. No rashes noted. Psychiatric: Normal mood and affect. Behavior is normal.   ASSESSMENT AND PLAN: 74 year old female here for reassessment the following:  Change in bowel habits - previously had persistent loose stools treated with Colestid which has significantly helped this, now having some mucus/leakage issues as above for the past week or so.  Unclear what to make of the mucus, does not seem to have urgency or symptoms concerning for proctitis otherwise.  Would continue Colestid for now given her stool frequency is much better, she is also using Dulcolax as needed.  We are obtaining CBC as outlined below.  If symptoms persist over time can consider a flex sig although may be low yield, we will keep me updated on this.  Abdominal pain - as above has had a variety of abdominal pains in the past with negative imaging and endoscopy.  RUQ pain seems improved. She does have relatively new left lower quadrant pain, unclear if this is bowel spasm, while diverticulitis is possible given her burden of diverticulosis she has never had this in the past on prior CT scans.  Would recommend she increase her Bentyl to 3 times daily.  I will obtain a CBC today to ensure normal white blood cell count and hemoglobin in light of her symptoms.  If no improvement can consider empiric antibiotics if symptoms persist to treat empirically for for diverticulitis, or consider repeat imaging however she has been imaged multiple times in the past again as above with CT.  GERD - continue Nexium 40 mg a day, recommend she take this before a meal to  maximize absorption, she is currently taking it with meals.  She can increase this to twice a day if needed.   Trinidad Cellar, MD [facility]

## 2019-08-14 NOTE — Patient Instructions (Signed)
If you are age 73 or older, your body mass index should be between 23-30. Your Body mass index is 41.71 kg/m. If this is out of the aforementioned range listed, please consider follow up with your Primary Care Provider.  If you are age 81 or younger, your body mass index should be between 19-25. Your Body mass index is 41.71 kg/m. If this is out of the aformentioned range listed, please consider follow up with your Primary Care Provider.   Please go to the lab in the basement of our building to have lab work done as you leave today. Hit "B" for basement when you get on the elevator.  When the doors open the lab is on your left.  We will call you with the results. Thank you.  Increase Bentyl to three times a day.  Take Nexium once a day 30 to 60 minutes prior to a meal.  Continue Cymbalta and colestid.  Thank you for entrusting me with your care and for choosing [facility], Dr. [place] Cellar      Due to recent changes in healthcare laws, you may see the results of your imaging and laboratory studies on MyChart before your provider has had a chance to review them.  We understand that in some cases there may be results that are confusing or concerning to you. Not all laboratory results come back in the same time frame and the provider may be waiting for multiple results in order to interpret others.  Please give [place] 48 hours in order for your provider to thoroughly review all the results before contacting the office for clarification of your results.

## 2019-08-16 DIAGNOSIS — Z96611 Presence of right artificial shoulder joint: Secondary | ICD-10-CM | POA: Diagnosis not present

## 2019-08-16 DIAGNOSIS — M25512 Pain in left shoulder: Secondary | ICD-10-CM | POA: Diagnosis not present

## 2019-08-16 DIAGNOSIS — Z471 Aftercare following joint replacement surgery: Secondary | ICD-10-CM | POA: Diagnosis not present

## 2019-08-16 DIAGNOSIS — M19012 Primary osteoarthritis, left shoulder: Secondary | ICD-10-CM | POA: Diagnosis not present

## 2019-10-01 ENCOUNTER — Telehealth: Payer: Self-pay | Admitting: Gastroenterology

## 2019-10-01 ENCOUNTER — Telehealth: Payer: Self-pay

## 2019-10-01 NOTE — Telephone Encounter (Signed)
Patient called states she is having rectal bleeding and is seeking advise

## 2019-10-01 NOTE — Telephone Encounter (Signed)
Called patient and she really wanted to talk to Jan, but agreed to tell me what was going on . States since [place]. or Friday she has had black/dk. red BMs that are jelly-like with clots. When she wipes the tissue is bright red. She denies any n/v, pain, fever. States she would like a call back from Jan.

## 2019-10-01 NOTE — Telephone Encounter (Signed)
Called patient. See patient return call note

## 2019-10-02 ENCOUNTER — Other Ambulatory Visit: Payer: Self-pay

## 2019-10-02 ENCOUNTER — Other Ambulatory Visit (INDEPENDENT_AMBULATORY_CARE_PROVIDER_SITE_OTHER): Payer: Medicare Other

## 2019-10-02 DIAGNOSIS — K625 Hemorrhage of anus and rectum: Secondary | ICD-10-CM

## 2019-10-02 LAB — CBC WITH DIFFERENTIAL/PLATELET
Basophils Absolute: 0 10*3/uL (ref 0.0–0.1)
Basophils Relative: 0.4 % (ref 0.0–3.0)
Eosinophils Absolute: 0 10*3/uL (ref 0.0–0.7)
Eosinophils Relative: 0.5 % (ref 0.0–5.0)
HCT: 33.4 % — ABNORMAL LOW (ref 36.0–46.0)
Hemoglobin: 11.3 g/dL — ABNORMAL LOW (ref 12.0–15.0)
Lymphocytes Relative: 35.4 % (ref 12.0–46.0)
Lymphs Abs: 2.4 10*3/uL (ref 0.7–4.0)
MCHC: 34 g/dL (ref 30.0–36.0)
MCV: 93.9 fl (ref 78.0–100.0)
Monocytes Absolute: 0.5 10*3/uL (ref 0.1–1.0)
Monocytes Relative: 7.3 % (ref 3.0–12.0)
Neutro Abs: 3.8 10*3/uL (ref 1.4–7.7)
Neutrophils Relative %: 56.4 % (ref 43.0–77.0)
Platelets: 169 10*3/uL (ref 150.0–400.0)
RBC: 3.55 Mil/uL — ABNORMAL LOW (ref 3.87–5.11)
RDW: 13.8 % (ref 11.5–15.5)
WBC: 6.7 10*3/uL (ref 4.0–10.5)

## 2019-10-02 NOTE — Telephone Encounter (Signed)
Called and spoke to patient.  She is continuing to have rectal bleeding that started on Thursday. This morning she reports a lot of dark blood that comes out like diarrhea with clots in it.  She denies a fever or fatigue.  She feels fine but is worried and that is causing her to have a headache.  Please advise.

## 2019-10-02 NOTE — Telephone Encounter (Signed)
Thanks [name] can you please call her today to see how she is doing. She's had a relatively recent endoscopy and colonoscopy, she has diverticulosis. If bleeding is mild / minimal and improving would monitor, if significant or persistent / worsening, please let me know. thanks

## 2019-10-02 NOTE — Telephone Encounter (Signed)
Called and spoke to patient. She reports rectal bleeding 2 to 3 times a day.  She thought it was better yesterday but then this morning when she got up it was a lot.  She did add that she had strawberry ice cream last week" before this all began" but has not have any since. She will go to the lab today for CBC. Order in.

## 2019-10-02 NOTE — Telephone Encounter (Signed)
Okay thanks very much for clarifying.

## 2019-10-02 NOTE — Telephone Encounter (Signed)
See CBC result note from today for continuation

## 2019-10-02 NOTE — Telephone Encounter (Signed)
See other phone note

## 2019-10-02 NOTE — Progress Notes (Signed)
Pt with rectal bleeding coming today for CBC per [facility]

## 2019-10-02 NOTE — Telephone Encounter (Signed)
Sorry to hear this. She could be having a diverticular bleed based on review of prior colonoscopy. Recommend she come to the office for a CBC today to make sure stable. Can you clarify how frequently she is passing blood? If this is significant bleeding that is persistent or significant drop in Hgb she will need to go to the hospital. Thanks

## 2019-10-03 ENCOUNTER — Telehealth: Payer: Self-pay | Admitting: Gastroenterology

## 2019-10-03 NOTE — Telephone Encounter (Signed)
Called patient. LM that [facility] would like for her to hold her Eliquis one more day and then resume as long as she hasn't had more bleeding.

## 2019-10-03 NOTE — Telephone Encounter (Signed)
Patient calling to give update: better this morning, no blood, small stool that was solid black. does not think its actively bleeding. States right now she is not going to the hospital, does not feel it is bad enough to go. She is asking for a call back.

## 2019-10-08 ENCOUNTER — Other Ambulatory Visit: Payer: Self-pay

## 2019-10-08 ENCOUNTER — Telehealth: Payer: Self-pay

## 2019-10-08 DIAGNOSIS — K625 Hemorrhage of anus and rectum: Secondary | ICD-10-CM

## 2019-10-08 NOTE — Telephone Encounter (Signed)
Spoke with patient, see patient message for more information.

## 2019-10-09 ENCOUNTER — Other Ambulatory Visit (INDEPENDENT_AMBULATORY_CARE_PROVIDER_SITE_OTHER): Payer: Medicare Other

## 2019-10-09 DIAGNOSIS — K625 Hemorrhage of anus and rectum: Secondary | ICD-10-CM | POA: Diagnosis not present

## 2019-10-09 LAB — CBC
HCT: 33.5 % — ABNORMAL LOW (ref 36.0–46.0)
Hemoglobin: 11.1 g/dL — ABNORMAL LOW (ref 12.0–15.0)
MCHC: 33.2 g/dL (ref 30.0–36.0)
MCV: 95.4 fl (ref 78.0–100.0)
Platelets: 212 10*3/uL (ref 150.0–400.0)
RBC: 3.51 Mil/uL — ABNORMAL LOW (ref 3.87–5.11)
RDW: 13.6 % (ref 11.5–15.5)
WBC: 5.9 10*3/uL (ref 4.0–10.5)

## 2019-10-10 ENCOUNTER — Telehealth: Payer: Self-pay

## 2019-10-10 ENCOUNTER — Encounter: Payer: Self-pay | Admitting: Gastroenterology

## 2019-10-10 ENCOUNTER — Ambulatory Visit (INDEPENDENT_AMBULATORY_CARE_PROVIDER_SITE_OTHER): Payer: Medicare Other | Admitting: Gastroenterology

## 2019-10-10 VITALS — BP 115/75 | HR 72 | Ht 64.0 in | Wt 236.8 lb

## 2019-10-10 DIAGNOSIS — Z7901 Long term (current) use of anticoagulants: Secondary | ICD-10-CM | POA: Diagnosis not present

## 2019-10-10 DIAGNOSIS — K922 Gastrointestinal hemorrhage, unspecified: Secondary | ICD-10-CM

## 2019-10-10 DIAGNOSIS — R194 Change in bowel habit: Secondary | ICD-10-CM | POA: Diagnosis not present

## 2019-10-10 MED ORDER — POLYETHYLENE GLYCOL 3350 17 G PO PACK: 17.0000 g | PACK | Freq: Two times a day (BID) | ORAL | 0 refills | Status: DC | PRN

## 2019-10-10 NOTE — Telephone Encounter (Signed)
[id number]-pt needs appt with APP for cardiac clearance

## 2019-10-10 NOTE — Telephone Encounter (Signed)
[facility] Pre-operative Risk Assessment     Request for surgical clearance:     Endoscopy Procedure  What type of surgery is being performed?     COLONOSCOPY  When is this surgery scheduled?     10-18-19  What type of clearance is required ?   Pharmacy  Are there any medications that need to be held prior to surgery and how long? ELIQUIS 2 DAYS  Practice name and name of physician performing surgery?    [facility]  What is your office phone and fax number?      Phone- [phone]  Fax- [place], CMA  Anesthesia type (None, local, MAC, general) ?       MAC  THANK YOU

## 2019-10-10 NOTE — Telephone Encounter (Signed)
Primary Cardiologist:[name], MD  Chart reviewed as part of pre-operative protocol coverage. Because of [name]'s past medical history and time since last visit, he/she will require a follow-up visit in order to better assess preoperative cardiovascular risk.  Pre-op covering staff: - Please schedule appointment and call patient to inform them. - Please contact requesting surgeon's office via preferred method (i.e, phone, fax) to inform them of need for appointment prior to surgery.  If applicable, this message will also be routed to pharmacy pool and/or primary cardiologist for input on holding anticoagulant/antiplatelet agent as requested below so that this information is available at time of patient's appointment.   Deberah Pelton, NP  10/10/2019, 1:47 PM

## 2019-10-10 NOTE — Telephone Encounter (Signed)
Forwarded to requesting party via EPIC fax function 

## 2019-10-10 NOTE — Progress Notes (Signed)
HPI :  74 year old female known to me for multiple GI issues, here for add-on acute visit for symptoms of GI bleeding.  I performed an endoscopy for her in February which showed gastric sleeve anatomy, otherwise normal exam.  During that time she was noted to be in atrial fibrillation and followed up with her cardiologist, she was placed on Eliquis and has been on it since that time.  Around July 12 she developed rectal bleeding with dark clots and purple blood which changed to bright red blood.  I advised her to hold her Eliquis and the bleeding slowed down over 2 days and stopped.  She had a few days worth of brown stool, she then resumed her Eliquis and has had again some bright red blood as well as some dark clots with her bowel movements.  Her baseline hemoglobin was 12.9 about a month ago, on July 13 it had reduced to 11.3.  I repeated her hemoglobin yesterday and it was 11.1.  I asked her again to hold her Eliquis and she stopped it yesterday.  She states bleeding seems to have slowed down again and endorses being constipated, passing small balls of old dark material.  She has had a little bit of left lower quadrant discomfort during this course.  She has never had GI bleeding like this before.  She had a colonoscopy with me in January 2019 which did not show any polyps.  She did have severe diverticulosis at that time.  Her EGD showed no cause for bleeding in February and she has been maintained on Nexium twice daily for upper tract symptoms.  She has been taking Dulcolax as needed for constipation but not happy with the regimen, she has historically tried to avoid MiraLAX as she does not like taking liquid medications.  Normally she has loose stools that bother her, the constipation is a rather new issue. She is feeling slightly weak in the past few days.  Her blood pressure and vital signs are stable today.  Prior workup: CT scan 01/13/2018 - no acute findings  EGD 04/15/2017 - sleeve  gastrectomy, otherwise normal exam - biopsies taken to rule out H pylori -   Colonoscopy 04/15/2017 - normal TI, multiple diverticuli, normal exam otherwise - biopsies taken to rule out microscopic colitis  EGD 04/23/19 - - A 3 cm hiatal hernia was present. - The exam of the esophagus was otherwise normal. - A guidewire was placed and the scope was withdrawn. Dilation was performed in the entire esophagus with a Savary dilator with mild resistance at 17 mm and 18 mm. Relook endoscopy showed no mucosal wrents. - Evidence of a sleeve gastrectomy was found in the cardia / fundus. This was characterized by healthy appearing mucosa. - The exam of the stomach was otherwise normal. - The duodenal bulb and second portion of the duodenum were normal.   Barium swallow 06/11/19 - Moderate esophageal dysmotility with suspected intermittent esophageal spasm.   CT scan 06/19/19 -IMPRESSION: 1. No acute findings or explanation for the patient's symptoms. 2. Moderate size hiatal hernia status post gastric sleeve resection. 3. Extensive colonic diverticulosis without evidence of acute inflammation. 4. Stable biliary dilatation, likely physiologic post cholecystectomy. 5. Aortic Atherosclerosis (ICD10-I70.0).    Past Medical History:  Diagnosis Date  . Allergy   . Anemia   . Antral polyp    benign  . Anxiety   . Arthritis   . Asthma   . B12 deficiency   . Back pain   .  Chest pain   . Constipation   . Depression   . Diverticulosis   . Gallbladder problem   . GERD (gastroesophageal reflux disease) 09/09/2011  . History of transfusion   . HTN (hypertension) 09/09/2011  . Hyperlipemia 09/09/2011  . Hypertension   . IBS (irritable bowel syndrome)   . Internal hemorrhoid   . Joint pain   . Obesity   . Osteoarthritis   . Paroxysmal atrial fibrillation (HCC)   . PONV (postoperative nausea and vomiting)    with big surgeries, none with endos etc, with colonoscopy- 5-6 years ago  could not swallow   . Shortness of breath   . Sleep apnea    borderline per patient no cpap   . SOB (shortness of breath) on exertion   . Swallowing difficulty   . Tachycardia   . Unstable angina ([place]) 09/09/2011  . Vitamin D deficiency   . Vocal cord dysfunction 2013   swelling     Past Surgical History:  Procedure Laterality Date  . ABDOMINAL HYSTERECTOMY  1988  . ANKLE FUSION Right    x 2  . BACK SURGERY  [phone]   3 ruptured discs, lower back  . BILATERAL ANTERIOR TOTAL HIP ARTHROPLASTY Bilateral 11/12/2014   Procedure: BILATERAL ANTERIOR TOTAL HIP ARTHROPLASTY;  Surgeon: Paralee Cancel, MD;  Location: [facility];  Service: Orthopedics;  Laterality: Bilateral;  . BRAVO [place] STUDY [name] 03/06/2013   Procedure: BRAVO [place] STUDY;  Surgeon: Inda Castle, MD;  Location: [facility];  Service: Endoscopy;  Laterality: [name];  . CARDIAC CATHETERIZATION  2013   NORMAL  . CHOLECYSTECTOMY    . COLONOSCOPY    . ELBOW SURGERY Left    removed bone chip  . ESOPHAGOGASTRODUODENOSCOPY [name] 03/06/2013   Procedure: ESOPHAGOGASTRODUODENOSCOPY (EGD);  Surgeon: Inda Castle, MD;  Location: [facility];  Service: Endoscopy;  Laterality: [name];  . FOOT OSTEOTOMY W/ PLANTAR FASCIA RELEASE Left   . ILIOTIBIAL BAND RELEASE Right 1997  . JOINT REPLACEMENT    . LAPAROSCOPIC GASTRIC SLEEVE RESECTION WITH HIATAL HERNIA REPAIR  08/20/2014   Procedure: LAPAROSCOPIC GASTRIC SLEEVE RESECTION WITH HIATAL HERNIA  REPAIR AND  UPPER ENDOSCOPY;  Surgeon: Johnathan Hausen, MD;  [facility];  Service: General;;  . LEFT HEART CATHETERIZATION WITH CORONARY ANGIOGRAM Bilateral 09/10/2011   Procedure: LEFT HEART CATHETERIZATION WITH CORONARY ANGIOGRAM;  Surgeon: Lorretta Harp, MD;  Location: [facility] LAB;  Service: Cardiovascular;  Laterality: Bilateral;  . NASAL SINUS SURGERY  1997  . REVERSE SHOULDER ARTHROPLASTY Right 05/14/2016   Procedure: RIGHT REVERSE SHOULDER ARTHROPLASTY;  Surgeon: Netta Cedars, MD;   Location: [facility];  Service: Orthopedics;  Laterality: Right;  . SHOULDER SURGERY Left   . tfc wrist Left 1997  . thumb replacement Bilateral    one 2009 and one 2014  . TOTAL ANKLE REPLACEMENT Right    x 3  . TOTAL ANKLE REPLACEMENT Left   . TOTAL ANKLE REPLACEMENT    . TOTAL HIP ARTHROPLASTY Bilateral   . TOTAL KNEE ARTHROPLASTY Bilateral    left 2002, right 2012  . TOTAL SHOULDER ARTHROPLASTY Right 05/14/2016  . TRANSTHORACIC ECHOCARDIOGRAM  09/09/2011   MILD CONCENTRIC LVH. TRACE MITRAL REGURG. TRACE TR. MILD AORTIC REGURG.  . 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 UPPER GI ENDOSCOPY  08/20/2014   Procedure: UPPER GI ENDOSCOPY;  Surgeon: Johnathan Hausen, MD;  [facility];  Service: General;;  . WRIST SURGERY Left 1997, 2009    Family  History  Problem Relation Age of Onset  . Hypertension Mother   . Diabetes Mother   . Arthritis Mother   . Esophageal cancer Mother   . Cancer Mother   . Esophageal cancer Father 14  . Cancer Father   . Alcoholism Father   . Other Sister        kidney mass removed  . Hypertension Sister   . Stroke Sister        died in 12/30/18  . Heart disease Maternal Grandmother   . Stroke Paternal Grandmother   . Coronary artery disease Neg Hx   . Colon cancer Neg Hx   . Liver disease Neg Hx   . Kidney disease Neg Hx   . Rectal cancer Neg Hx   . Stomach cancer Neg Hx    Social History   Tobacco Use  . Smoking status: Never Smoker  . Smokeless tobacco: Never Used  Vaping Use  . Vaping Use: Never used  Substance Use Topics  . Alcohol use: No  . Drug use: No   Current Outpatient Medications  Medication Sig Dispense Refill  . acetaminophen (TYLENOL) 500 MG tablet Take 1,000 mg by mouth every 6 (six) hours as needed (for pain.).     [name] albuterol (PROVENTIL HFA;VENTOLIN HFA) 108 (90 Base) MCG/ACT inhaler Inhale 1-2 puffs into the lungs every 6 (six) hours as needed for shortness of breath or wheezing.    [name] ALPRAZolam (XANAX) 0.25  MG tablet Take 0.125 mg by mouth as needed for anxiety.    [name] apixaban (ELIQUIS) 5 MG TABS tablet Take 1 tablet (5 mg total) by mouth 2 (two) times daily. 180 tablet 3  . cetirizine (ZYRTEC) 10 MG tablet Take 10 mg by mouth daily.     . cholecalciferol (VITAMIN D) 1000 units tablet Take 1 tablet (1,000 Units total) by mouth 2 (two) times daily. (Patient taking differently: Take 2,000 Units by mouth 2 (two) times daily. ) 60 tablet 0  . colestipol (COLESTID) 1 g tablet Take 1 tablet (1 g total) by mouth daily. 60 tablet 3  . dicyclomine (BENTYL) 10 MG capsule Take 1 capsule (10 mg total) by mouth in the morning, at noon, and at bedtime. 90 capsule 3  . diltiazem (DILT-XR) 240 MG 24 hr capsule Take 1 capsule (240 mg total) by mouth daily. 90 capsule 3  . DULoxetine (CYMBALTA) 60 MG capsule Take 1 capsule by mouth daily.    [name] esomeprazole (NEXIUM) 40 MG capsule Take 1 capsule (40 mg total) by mouth 2 (two) times daily before a meal. 60 capsule 2  . Loperamide HCl (IMODIUM PO) Take 1 tablet by mouth daily.    [name] losartan (COZAAR) 50 MG tablet Take 1 tablet by mouth daily.    . Melatonin 1 MG CAPS Take 1 capsule by mouth at bedtime as needed.    . methocarbamol (ROBAXIN) 500 MG tablet Take 500 mg by mouth every 6 (six) hours as needed for muscle spasms.    . Multiple Vitamin (MULTIVITAMIN WITH MINERALS) TABS tablet Take 1 tablet by mouth daily. Centrum     . ondansetron (ZOFRAN ODT) 8 MG disintegrating tablet Take 1 tablet (8 mg total) by mouth every 8 (eight) hours as needed for nausea or vomiting. Take at least once a day 90 tablet 1  . simvastatin (ZOCOR) 20 MG tablet Take 1 tablet (20 mg total) by mouth daily. 30 tablet 11  . vitamin B-12 (CYANOCOBALAMIN) 1000 MCG tablet Take 1,000 mcg  by mouth daily.     No current facility-administered medications for this visit.   Allergies  Allergen Reactions  . Ativan [Lorazepam] Other (See Comments)    Agitation, aggressive actions, significant mental  changes  . Cefdinir Hives    Hives, headache  . Buprenorphine Hcl Itching and Rash  . Fentanyl Itching  . Morphine And Related Itching and Rash     Review of Systems: All systems reviewed and negative except where noted in HPI.    No results found.  Physical Exam: BP 115/75   Pulse 72   Ht 5\' 4"  (1.626 m)   Wt 236 lb 12.8 oz (107.4 kg)   BMI 40.65 kg/m  Constitutional: Pleasant,well-developed, female in no acute distress. Abdominal: Soft, nondistended, nontender. There are no masses palpable. DRE shows no blood in the vault, no stool. Tia Alert as standby Extremities: no edema Lymphadenopathy: No cervical adenopathy noted. Neurological: Alert and oriented to person place and time. Skin: Skin is warm and dry. No rashes noted. Psychiatric: Normal mood and affect. Behavior is normal.   ASSESSMENT AND PLAN: 74 year old female here for reassessment of the following:  GI bleeding / change in bowel habits / anticoagulated - started Eliquis this past February, she has had first-time episode of bleeding last week, as outlined above she did not want to go to the hospital and symptoms seem to be improving with holding Eliquis, Hgb had decreased to 11's from 12's initially.  Unfortunately when resuming Eliquis she has had some rebleeding, hemoglobin about the same, since stopping her Eliquis symptoms have seemed to have resolved again.  On DRE today there is no blood in the vault, dark brown stool noted.  We discussed differential diagnosis for her symptoms.  Seems to be a mix of red blood with clots and some dark stools.  I suspect she has probably had a slow diverticular bleed.  Upper tract bleeding from her stomach seems much less likely given recent EGD findings and that she has maintained on PPI.  Small bowel bleeding also possible in the setting of anticoagulation.  She is quite frustrated by this issue and is anxious about potential etiologies and risks for rebleeding.  I reassured her  that I think something bad like malignancy would be very unlikely given her results of last EGD and colonoscopy and CT scans.  Diverticular bleeding is most likely and we discussed what that is.  This seems to have stopped again, would recommend holding Eliquis for the next few days through this week given her recurrent symptoms in recent days, and can resume over the weekend if she has clearly stopped bleeding.  I will repeat another CBC on Monday.  We discussed options in regards to further evaluation.  She is rather anxious about resuming Eliquis without knowing for sure what is causing this, I offered her a colonoscopy to ensure no interval changes since her last exam although again I think the likelihood of a polyp or mass lesion is extremely low.  We discussed risk and benefits of colonoscopy and anesthesia, she wanted to tentatively have an appointment for colonoscopy next week and if she continues to have intermittent slow bleeding we will proceed with that exam.  If she has cessation of bleeding without recurrence, she may wish to hold off.  We will await her course over the next few days and her CBC on Monday.  Otherwise in regards to her constipation, recommend MiraLAX mixed with what ever she normally drinks, we discussed how to  take this and she was agreeable.  She can titrate as needed.  If she has any significant bleeding / recurrence in the interim, she was instructed to go to the hospital.  She has hoped to avoid the hospital and manage this as outpatient if at all possible.  [place] Cellar, MD [facility]

## 2019-10-10 NOTE — Telephone Encounter (Signed)
Dr. Havery Moros - please be advised. Cardiology is requiring an appt (which is yet to be scheduled) prior to clearance to hold Eliquis for next week's procedure.

## 2019-10-10 NOTE — Patient Instructions (Signed)
If you are age 73 or older, your body mass index should be between 23-30. Your Body mass index is 40.65 kg/m. If this is out of the aforementioned range listed, please consider follow up with your Primary Care Provider.  If you are age 51 or younger, your body mass index should be between 19-25. Your Body mass index is 40.65 kg/m. If this is out of the aformentioned range listed, please consider follow up with your Primary Care Provider.   You have been scheduled for a colonoscopy. Please follow written instructions given to you at your visit today.  Please pick up your prep supplies at the pharmacy within the next 1-3 days. If you use inhalers (even only as needed), please bring them with you on the day of your procedure.  We are giving you a sample of SUTAB today for your procedure.  Please return if you end of canceling your colonoscopy.  Hold your Eliquis through Friday.  You can resume this weekend if your symptoms resolve.  Please go to the lab on Monday for a CBC. They are open from 8:00am to 5:00pm. You do not need an appointment.  Use Miralax once to twice a day.  You can increase or decrease as needed.  Thank you for entrusting me with your care and for choosing [facility], Dr.  Cellar

## 2019-10-10 NOTE — Telephone Encounter (Signed)
Patient with diagnosis of afib on Eliquis for anticoagulation.    Procedure: colonoscopy Date of procedure: 10/18/19  CHADS2-VASc score of 3 (age, sex, HTN)  CrCl 64 using adjusted body weight Platelet count 212  Per office protocol, patient can hold Eliquis for 2 days prior to procedure as requested.

## 2019-10-11 ENCOUNTER — Ambulatory Visit: Payer: Medicare Other | Admitting: Gastroenterology

## 2019-10-11 NOTE — Telephone Encounter (Signed)
Hello, to clarify, this patient has been having recurrent lower GI bleeding and we have had to hold her Eliquis for short periods of time over the past 2 weeks to help resolve it. I have held it a few days this week due to recurrent symptoms. If she has recurrent bleeding again when we resume it we will have to hold it. She is scheduled for colonoscopy next week to clarify source of her bleeding. Thanks

## 2019-10-12 NOTE — Telephone Encounter (Signed)
Dr. Havery Moros sent a message to the preop pool indicating that Ms. [name] is having intermittent GI bleeding.  I attempted to contact her via phone and left a detailed message about calling back.  This will help [place] assess for any new cardiac symptoms prior to providing cardiac clearance.  I will attempt to call back later today.

## 2019-10-14 ENCOUNTER — Other Ambulatory Visit: Payer: Self-pay

## 2019-10-14 ENCOUNTER — Inpatient Hospital Stay (HOSPITAL_COMMUNITY): Payer: Medicare Other

## 2019-10-14 ENCOUNTER — Encounter (HOSPITAL_COMMUNITY): Payer: Self-pay

## 2019-10-14 ENCOUNTER — Observation Stay (HOSPITAL_COMMUNITY)
Admission: EM | Admit: 2019-10-14 | Discharge: 2019-10-15 | Disposition: A | Discharge status: Home / Self Care | Payer: Medicare Other | Attending: Family Medicine | Admitting: Family Medicine

## 2019-10-14 DIAGNOSIS — K922 Gastrointestinal hemorrhage, unspecified: Secondary | ICD-10-CM | POA: Diagnosis not present

## 2019-10-14 DIAGNOSIS — Z96652 Presence of left artificial knee joint: Secondary | ICD-10-CM | POA: Diagnosis not present

## 2019-10-14 DIAGNOSIS — K625 Hemorrhage of anus and rectum: Secondary | ICD-10-CM

## 2019-10-14 DIAGNOSIS — J45909 Unspecified asthma, uncomplicated: Secondary | ICD-10-CM | POA: Insufficient documentation

## 2019-10-14 DIAGNOSIS — I10 Essential (primary) hypertension: Secondary | ICD-10-CM | POA: Insufficient documentation

## 2019-10-14 DIAGNOSIS — R42 Dizziness and giddiness: Secondary | ICD-10-CM | POA: Diagnosis not present

## 2019-10-14 DIAGNOSIS — Z79899 Other long term (current) drug therapy: Secondary | ICD-10-CM | POA: Diagnosis not present

## 2019-10-14 DIAGNOSIS — Z6841 Body Mass Index (BMI) 40.0 and over, adult: Secondary | ICD-10-CM

## 2019-10-14 DIAGNOSIS — R7989 Other specified abnormal findings of blood chemistry: Secondary | ICD-10-CM | POA: Diagnosis not present

## 2019-10-14 DIAGNOSIS — R1032 Left lower quadrant pain: Secondary | ICD-10-CM | POA: Diagnosis not present

## 2019-10-14 DIAGNOSIS — R0602 Shortness of breath: Secondary | ICD-10-CM | POA: Diagnosis not present

## 2019-10-14 DIAGNOSIS — R944 Abnormal results of kidney function studies: Secondary | ICD-10-CM | POA: Insufficient documentation

## 2019-10-14 DIAGNOSIS — Z7901 Long term (current) use of anticoagulants: Secondary | ICD-10-CM | POA: Diagnosis not present

## 2019-10-14 DIAGNOSIS — Z20822 Contact with and (suspected) exposure to covid-19: Secondary | ICD-10-CM | POA: Insufficient documentation

## 2019-10-14 DIAGNOSIS — K5730 Diverticulosis of large intestine without perforation or abscess without bleeding: Secondary | ICD-10-CM | POA: Diagnosis not present

## 2019-10-14 DIAGNOSIS — K5731 Diverticulosis of large intestine without perforation or abscess with bleeding: Secondary | ICD-10-CM | POA: Diagnosis not present

## 2019-10-14 DIAGNOSIS — I4891 Unspecified atrial fibrillation: Secondary | ICD-10-CM | POA: Diagnosis not present

## 2019-10-14 DIAGNOSIS — F419 Anxiety disorder, unspecified: Secondary | ICD-10-CM | POA: Insufficient documentation

## 2019-10-14 LAB — TROPONIN I (HIGH SENSITIVITY)
Troponin I (High Sensitivity): 7 ng/L (ref <18)
Troponin I (High Sensitivity): 9 ng/L (ref <18)

## 2019-10-14 LAB — TYPE AND SCREEN
ABO/RH(D): A POS
Antibody Screen: NEGATIVE

## 2019-10-14 LAB — COMPREHENSIVE METABOLIC PANEL
ALT: 23 U/L (ref 0–44)
AST: 23 U/L (ref 15–41)
Albumin: 3.8 g/dL (ref 3.5–5.0)
Alkaline Phosphatase: 61 U/L (ref 38–126)
Anion gap: 10 (ref 5–15)
BUN: 23 mg/dL (ref 8–23)
CO2: 26 mmol/L (ref 22–32)
Calcium: 9.3 mg/dL (ref 8.9–10.3)
Chloride: 105 mmol/L (ref 98–111)
Creatinine, Ser: 1.12 mg/dL — ABNORMAL HIGH (ref 0.44–1.00)
GFR calc Af Amer: 56 mL/min — ABNORMAL LOW (ref >60)
GFR calc non Af Amer: 49 mL/min — ABNORMAL LOW (ref >60)
Glucose, Bld: 106 mg/dL — ABNORMAL HIGH (ref 70–99)
Potassium: 5 mmol/L (ref 3.5–5.1)
Sodium: 141 mmol/L (ref 135–145)
Total Bilirubin: 0.6 mg/dL (ref 0.3–1.2)
Total Protein: 6.5 g/dL (ref 6.5–8.1)

## 2019-10-14 LAB — CBC
HCT: 37.2 % (ref 36.0–46.0)
Hemoglobin: 11.6 g/dL — ABNORMAL LOW (ref 12.0–15.0)
MCH: 31.5 pg (ref 26.0–34.0)
MCHC: 31.2 g/dL (ref 30.0–36.0)
MCV: 101.1 fL — ABNORMAL HIGH (ref 80.0–100.0)
Platelets: 236 10*3/uL (ref 150–400)
RBC: 3.68 MIL/uL — ABNORMAL LOW (ref 3.87–5.11)
RDW: 13.8 % (ref 11.5–15.5)
WBC: 7 10*3/uL (ref 4.0–10.5)
nRBC: 0 % (ref 0.0–0.2)

## 2019-10-14 LAB — SARS CORONAVIRUS 2 BY RT PCR (HOSPITAL ORDER, PERFORMED IN ~~LOC~~ HOSPITAL LAB): SARS Coronavirus 2: NEGATIVE

## 2019-10-14 LAB — PROTIME-INR
INR: 1.1 (ref 0.8–1.2)
Prothrombin Time: 13.6 seconds (ref 11.4–15.2)

## 2019-10-14 MED ORDER — DULOXETINE HCL 30 MG PO CPEP
60.0000 mg | ORAL_CAPSULE | Freq: Every day | ORAL | Status: DC
  Administered 2019-10-14 – 2019-10-15 (×2): 60 mg via ORAL
  Filled 2019-10-14 (×2): qty 2

## 2019-10-14 MED ORDER — ACETAMINOPHEN 325 MG PO TABS
650.0000 mg | ORAL_TABLET | Freq: Four times a day (QID) | ORAL | Status: DC | PRN
  Administered 2019-10-14: 650 mg via ORAL
  Filled 2019-10-14: qty 2

## 2019-10-14 MED ORDER — SODIUM CHLORIDE 0.9% FLUSH: 3.0000 mL | Freq: Two times a day (BID) | INTRAVENOUS | Status: DC

## 2019-10-14 MED ORDER — IOHEXOL 300 MG/ML  SOLN
100.0000 mL | Freq: Once | INTRAMUSCULAR | Status: AC | PRN
  Administered 2019-10-14: 100 mL via INTRAVENOUS

## 2019-10-14 MED ORDER — SIMVASTATIN 20 MG PO TABS
20.0000 mg | ORAL_TABLET | Freq: Every day | ORAL | Status: DC
  Administered 2019-10-14 – 2019-10-15 (×2): 20 mg via ORAL
  Filled 2019-10-14 (×2): qty 1

## 2019-10-14 MED ORDER — LOSARTAN POTASSIUM 50 MG PO TABS
50.0000 mg | ORAL_TABLET | Freq: Every day | ORAL | Status: DC
  Administered 2019-10-15: 50 mg via ORAL
  Filled 2019-10-14 (×2): qty 1

## 2019-10-14 MED ORDER — MELATONIN 3 MG PO TABS: 3.0000 mg | ORAL_TABLET | Freq: Every evening | ORAL | Status: DC | PRN

## 2019-10-14 MED ORDER — SODIUM CHLORIDE 0.9 % IV SOLN: INTRAVENOUS | Status: AC

## 2019-10-14 MED ORDER — SODIUM CHLORIDE (PF) 0.9 % IJ SOLN
INTRAMUSCULAR | Status: AC
  Administered 2019-10-14: 3 mL via INTRAVENOUS
  Filled 2019-10-14: qty 50

## 2019-10-14 MED ORDER — ACETAMINOPHEN 650 MG RE SUPP: 650.0000 mg | Freq: Four times a day (QID) | RECTAL | Status: DC | PRN

## 2019-10-14 MED ORDER — DILTIAZEM HCL ER COATED BEADS 240 MG PO CP24
240.0000 mg | ORAL_CAPSULE | Freq: Every day | ORAL | Status: DC
  Administered 2019-10-15: 240 mg via ORAL
  Filled 2019-10-14 (×2): qty 1
  Filled 2019-10-14: qty 2

## 2019-10-14 MED ORDER — PANTOPRAZOLE SODIUM 40 MG PO TBEC
40.0000 mg | DELAYED_RELEASE_TABLET | Freq: Every day | ORAL | Status: DC
  Administered 2019-10-14 – 2019-10-15 (×2): 40 mg via ORAL
  Filled 2019-10-14 (×2): qty 1

## 2019-10-14 MED ORDER — ALPRAZOLAM 0.25 MG PO TABS
0.1250 mg | ORAL_TABLET | ORAL | Status: DC | PRN
  Administered 2019-10-15 (×2): 0.125 mg via ORAL
  Filled 2019-10-14 (×2): qty 1

## 2019-10-14 MED ORDER — ONDANSETRON 8 MG PO TBDP
8.0000 mg | ORAL_TABLET | Freq: Three times a day (TID) | ORAL | Status: DC | PRN
  Administered 2019-10-15: 8 mg via ORAL
  Filled 2019-10-14: qty 1

## 2019-10-14 MED ORDER — SODIUM CHLORIDE 0.9 % IV SOLN: Freq: Once | INTRAVENOUS | Status: AC

## 2019-10-14 MED ORDER — COLESTIPOL HCL 1 G PO TABS
1.0000 g | ORAL_TABLET | Freq: Every day | ORAL | Status: DC
  Administered 2019-10-15: 1 g via ORAL
  Filled 2019-10-14 (×2): qty 1

## 2019-10-14 MED ORDER — ALPRAZOLAM 0.5 MG PO TABS
0.5000 mg | ORAL_TABLET | Freq: Once | ORAL | Status: AC
  Administered 2019-10-14: 0.5 mg via ORAL
  Filled 2019-10-14: qty 1

## 2019-10-14 MED ORDER — DICYCLOMINE HCL 10 MG PO CAPS: 10.0000 mg | ORAL_CAPSULE | Freq: Every day | ORAL | Status: DC | PRN

## 2019-10-14 NOTE — Consult Note (Signed)
HISTORY OF PRESENT ILLNESS:  [name] is a 74 y.o. female with multiple medical problems as listed below who has a prior history of bariatric surgery in the form of sleeve gastrectomy and irritable bowel syndrome.  She presents to the hospital today with rectal bleeding.  She does have a history of atrial fibrillation for which she is on Eliquis.  The patient underwent colonoscopy in January 2019.  She was found to have severe pandiverticulosis.  No hemorrhoids.  EGD February 2021 revealed sleeve gastrectomy anatomy and esophageal stricture which was dilated.  Patient saw Dr. Havery Moros recently with complaints of intermittent rectal bleeding.  Recent hemoglobin 12.8.  Hemoglobin on October 09, 2019 was 11.1.  She has stopped Eliquis for a short period of time but recently resumed.  Plans are for colonoscopy later this week with Dr. Havery Moros.  She was advised to go the emergency room with complaints of recurrent bleeding.  Quite significant on one episode.  No dizziness.  Vital signs are stable.  Current hemoglobin 11.6.  She is currently in the emergency room with her husband at the bedside.  She does have alternating bowel habits chronically.  This is unchanged  REVIEW OF SYSTEMS:  All non-GI ROS negative unless otherwise stated in the HPI except for anxiety, arthritis  Past Medical History:  Diagnosis Date  . Allergy   . Anemia   . Antral polyp    benign  . Anxiety   . Arthritis   . Asthma   . B12 deficiency   . Back pain   . Chest pain   . Constipation   . Depression   . Diverticulosis   . Gallbladder problem   . GERD (gastroesophageal reflux disease) 09/09/2011  . History of transfusion   . HTN (hypertension) 09/09/2011  . Hyperlipemia 09/09/2011  . Hypertension   . IBS (irritable bowel syndrome)   . Internal hemorrhoid   . Joint pain   . Obesity   . Osteoarthritis   . Paroxysmal atrial fibrillation (HCC)   . PONV (postoperative nausea and vomiting)    with big  surgeries, none with endos etc, with colonoscopy- 5-6 years ago could not swallow   . Shortness of breath   . Sleep apnea    borderline per patient no cpap   . SOB (shortness of breath) on exertion   . Swallowing difficulty   . Tachycardia   . Unstable angina ([place]) 09/09/2011  . Vitamin D deficiency   . Vocal cord dysfunction 2013   swelling    Past Surgical History:  Procedure Laterality Date  . ABDOMINAL HYSTERECTOMY  1988  . ANKLE FUSION Right    x 2  . BACK SURGERY  [phone]   3 ruptured discs, lower back  . BILATERAL ANTERIOR TOTAL HIP ARTHROPLASTY Bilateral 11/12/2014   Procedure: BILATERAL ANTERIOR TOTAL HIP ARTHROPLASTY;  Surgeon: Paralee Cancel, MD;  [facility]: [facility];  Service: Orthopedics;  Laterality: Bilateral;  . BRAVO [place] STUDY [name] 03/06/2013   Procedure: BRAVO [place] STUDY;  Surgeon: Inda Castle, MD;  [facility];  Service: Endoscopy;  Laterality: [name];  . CARDIAC CATHETERIZATION  2013   NORMAL  . CHOLECYSTECTOMY    . COLONOSCOPY    . ELBOW SURGERY Left    removed bone chip  . ESOPHAGOGASTRODUODENOSCOPY [name] 03/06/2013   Procedure: ESOPHAGOGASTRODUODENOSCOPY (EGD);  Surgeon: Inda Castle, MD;  Location: [facility];  Service: Endoscopy;  Laterality: [name];  . FOOT OSTEOTOMY W/ PLANTAR FASCIA RELEASE Left   .  ILIOTIBIAL BAND RELEASE Right 1997  . JOINT REPLACEMENT    . LAPAROSCOPIC GASTRIC SLEEVE RESECTION WITH HIATAL HERNIA REPAIR  08/20/2014   Procedure: LAPAROSCOPIC GASTRIC SLEEVE RESECTION WITH HIATAL HERNIA  REPAIR AND  UPPER ENDOSCOPY;  Surgeon: Johnathan Hausen, MD;  [facility]: [facility];  Service: General;;  . LEFT HEART CATHETERIZATION WITH CORONARY ANGIOGRAM Bilateral 09/10/2011   Procedure: LEFT HEART CATHETERIZATION WITH CORONARY ANGIOGRAM;  Surgeon: Lorretta Harp, MD;  Location: [facility] CATH LAB;  Service: Cardiovascular;  Laterality: Bilateral;  . NASAL SINUS SURGERY  1997  . REVERSE SHOULDER ARTHROPLASTY Right 05/14/2016   Procedure: RIGHT  REVERSE SHOULDER ARTHROPLASTY;  Surgeon: Netta Cedars, MD;  Location: [facility];  Service: Orthopedics;  Laterality: Right;  . SHOULDER SURGERY Left   . tfc wrist Left 1997  . thumb replacement Bilateral    one 2009 and one 2014  . TOTAL ANKLE REPLACEMENT Right    x 3  . TOTAL ANKLE REPLACEMENT Left   . TOTAL ANKLE REPLACEMENT    . TOTAL HIP ARTHROPLASTY Bilateral   . TOTAL KNEE ARTHROPLASTY Bilateral    left 2002, right 2012  . TOTAL SHOULDER ARTHROPLASTY Right 05/14/2016  . TRANSTHORACIC ECHOCARDIOGRAM  09/09/2011   MILD CONCENTRIC LVH. TRACE MITRAL REGURG. TRACE TR. MILD AORTIC REGURG.  . 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 UPPER GI ENDOSCOPY  08/20/2014   Procedure: UPPER GI ENDOSCOPY;  Surgeon: Johnathan Hausen, MD;  [facility]: [facility];  Service: General;;  . WRIST SURGERY Left 1997, 2009     Social History [name]  reports that she has never smoked. She has never used smokeless tobacco. She reports that she does not drink alcohol and does not use drugs.  family history includes Alcoholism in her father; Arthritis in her mother; Cancer in her father and mother; Diabetes in her mother; Esophageal cancer in her mother; Esophageal cancer (age of onset: 31) in her father; Heart disease in her maternal grandmother; Hypertension in her mother and sister; Other in her sister; Stroke in her paternal grandmother and sister.  Allergies  Allergen Reactions  . Ativan [Lorazepam] Other (See Comments)    Agitation, aggressive actions, significant mental changes  . Cefdinir Hives    Hives, headache  . Buprenorphine Hcl Itching and Rash  . Fentanyl Itching  . Morphine And Related Itching and Rash       PHYSICAL EXAMINATION: Vital signs: BP (!) 133/77 (BP Location: Right Arm)   Pulse 84   Temp 98.3 F (36.8 C) (Oral)   Resp 14   SpO2 100%   Constitutional: generally well-appearing, no acute distress Psychiatric: alert and oriented x3, cooperative Eyes: extraocular  movements intact, anicteric, conjunctiva pink Mouth: oral pharynx moist, no lesions Neck: supple no lymphadenopathy Cardiovascular: heart irregular rate and rhythm, no murmur Lungs: clear to auscultation bilaterally Abdomen: soft, obese, nontender, nondistended, no obvious ascites, no peritoneal signs, normal bowel sounds, no organomegaly Rectal: Omitted Extremities: no clubbing, cyanosis, or lower extremity edema bilaterally Skin: no lesions on visible extremities Neuro: No focal deficits.  Cranial nerves intact  ASSESSMENT:  1.  Recurrent minor rectal bleeding.  Presumably minor diverticular bleeding based on findings of his recent colonoscopy. 2.  Irritable bowel syndrome 3.  Multiple medical problems including atrial fibrillation for which she is on Eliquis   PLAN:  1.  Clear liquids 2.  Monitor blood counts 3.  Hold Eliquis 4.  If significant bleeding ensues, CT angiogram, and if positive IR directed  embolization therapy 5.  Will likely need relook colonoscopy.  Timing to be determined.  Docia Chuck. Geri [place]in[place]., M.D. [facility]

## 2019-10-14 NOTE — ED Provider Notes (Signed)
[name] Provider Note   CSN: [id number] Arrival date & time: 10/14/19  1027     History Chief Complaint  Patient presents with  . Rectal Bleeding    [name] is a 74 y.o. female.  She has a history of A. fib and is on anticoagulation, hypertension, gastric sleeve, intermittent GI bleed.  She follows with Dr. Enis Gash from [place].  She has had on and off rectal bleeding for the last few weeks.  She was in the office a few days ago and had a stable hemoglobin.  She set up for possible colonoscopy next week.  She restarted her Eliquis 2 days ago.  Had a normal bowel movement yesterday and today woke up with sensation of needing to move her bowels and had dark red large amount of blood with clot.  Associate with a little bit of left lower quadrant discomfort.  Feels a little more short of breath than normal and lightheaded when she stands up.  No syncope.  No other bleeding.  Has never required a transfusion for bleeding.  She called GI on-call who recommended she come to the emergency department for evaluation.  The history is provided by the patient and the spouse.  Rectal Bleeding Quality:  [place] Amount:  Moderate Timing:  Intermittent Chronicity:  Recurrent Context: constipation and defecation   Relieved by:  None tried Worsened by:  Defecation Ineffective treatments:  None tried Associated symptoms: abdominal pain, dizziness and light-headedness   Associated symptoms: no epistaxis, no fever, no hematemesis, no loss of consciousness and no vomiting   Risk factors: anticoagulant use        Past Medical History:  Diagnosis Date  . Allergy   . Anemia   . Antral polyp    benign  . Anxiety   . Arthritis   . Asthma   . B12 deficiency   . Back pain   . Chest pain   . Constipation   . Depression   . Diverticulosis   . Gallbladder problem   . GERD (gastroesophageal reflux disease) 09/09/2011  . History of transfusion   . HTN  (hypertension) 09/09/2011  . Hyperlipemia 09/09/2011  . Hypertension   . IBS (irritable bowel syndrome)   . Internal hemorrhoid   . Joint pain   . Obesity   . Osteoarthritis   . Paroxysmal atrial fibrillation (HCC)   . PONV (postoperative nausea and vomiting)    with big surgeries, none with endos etc, with colonoscopy- 5-6 years ago could not swallow   . Shortness of breath   . Sleep apnea    borderline per patient no cpap   . SOB (shortness of breath) on exertion   . Swallowing difficulty   . Tachycardia   . Unstable angina ([place]) 09/09/2011  . Vitamin D deficiency   . Vocal cord dysfunction 2013   swelling    Patient Active Problem List   Diagnosis Date Noted  . Chronic diarrhea 01/31/2018  . Irritable bowel syndrome with diarrhea 01/31/2018  . Bloating 01/31/2018  . Generalized abdominal pain 01/31/2018  . Colon cancer screening 03/24/2017  . Insulin resistance 02/15/2017  . Vitamin D deficiency 02/15/2017  . High serum vitamin B12 02/15/2017  . S/P shoulder replacement, right 05/14/2016  . Acute blood loss anemia 12/04/2014  . Obese 11/13/2014  . S/P bilateral THA, AA 11/12/2014  . Confusion 10/13/2014  . Acute confusional state 10/13/2014  . 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 Aftercare  following joint replacement 03/28/2014  . History of artificial joint 03/28/2014  . Degenerative arthritis of hip 09/27/2013  . Paroxysmal atrial fibrillation ([place]) 09/07/2013  . Tachycardia 07/03/2013  . Snoring 07/03/2013  . Diverticulosis of colon without hemorrhage 06/26/2013  . RUQ abdominal pain 01/04/2013  . Nausea without vomiting 01/04/2013  . Hoarseness of voice 01/04/2013  . Diarrhea 01/04/2013  . Broken toe 03/09/2012  . Dyspnea 11/05/2011  . Upper airway cough syndrome 11/05/2011  . Asthma, persistent controlled 11/05/2011  . Morbid obesity with BMI of 45.0-49.9, adult ([place]) 11/05/2011  . HTN (hypertension) 09/09/2011  . Hyperlipemia  09/09/2011  . Bursitis, trochanteric 08/09/2011  . Ankle arthropathy 05/31/2011    Past Surgical History:  Procedure Laterality Date  . ABDOMINAL HYSTERECTOMY  1988  . ANKLE FUSION Right    x 2  . BACK SURGERY  [phone]   3 ruptured discs, lower back  . BILATERAL ANTERIOR TOTAL HIP ARTHROPLASTY Bilateral 11/12/2014   Procedure: BILATERAL ANTERIOR TOTAL HIP ARTHROPLASTY;  Surgeon: Paralee Cancel, MD;  Location: [facility];  Service: Orthopedics;  Laterality: Bilateral;  . BRAVO [place] STUDY [name] 03/06/2013   Procedure: BRAVO [place] STUDY;  Surgeon: Inda Castle, MD;  Location: [facility];  Service: Endoscopy;  Laterality: [name];  . CARDIAC CATHETERIZATION  2013   NORMAL  . CHOLECYSTECTOMY    . COLONOSCOPY    . ELBOW SURGERY Left    removed bone chip  . ESOPHAGOGASTRODUODENOSCOPY [name] 03/06/2013   Procedure: ESOPHAGOGASTRODUODENOSCOPY (EGD);  Surgeon: Inda Castle, MD;  Location: [facility];  Service: Endoscopy;  Laterality: [name];  . FOOT OSTEOTOMY W/ PLANTAR FASCIA RELEASE Left   . ILIOTIBIAL BAND RELEASE Right 1997  . JOINT REPLACEMENT    . LAPAROSCOPIC GASTRIC SLEEVE RESECTION WITH HIATAL HERNIA REPAIR  08/20/2014   Procedure: LAPAROSCOPIC GASTRIC SLEEVE RESECTION WITH HIATAL HERNIA  REPAIR AND  UPPER ENDOSCOPY;  Surgeon: Johnathan Hausen, MD;  Location: [facility];  Service: General;;  . LEFT HEART CATHETERIZATION WITH CORONARY ANGIOGRAM Bilateral 09/10/2011   Procedure: LEFT HEART CATHETERIZATION WITH CORONARY ANGIOGRAM;  Surgeon: Lorretta Harp, MD;  Location: [facility] C[facility] LAB;  Service: Cardiovascular;  Laterality: Bilateral;  . NASAL SINUS SURGERY  1997  . REVERSE SHOULDER ARTHROPLASTY Right 05/14/2016   Procedure: RIGHT REVERSE SHOULDER ARTHROPLASTY;  Surgeon: Netta Cedars, MD;  Location: [facility];  Service: Orthopedics;  Laterality: Right;  . SHOULDER SURGERY Left   . tfc wrist Left 1997  . thumb replacement Bilateral    one 2009 and one 2014  . TOTAL ANKLE REPLACEMENT Right    x 3  .  TOTAL ANKLE REPLACEMENT Left   . TOTAL ANKLE REPLACEMENT    . TOTAL HIP ARTHROPLASTY Bilateral   . TOTAL KNEE ARTHROPLASTY Bilateral    left 2002, right 2012  . TOTAL SHOULDER ARTHROPLASTY Right 05/14/2016  . TRANSTHORACIC ECHOCARDIOGRAM  09/09/2011   MILD CONCENTRIC LVH. TRACE MITRAL REGURG. TRACE TR. MILD AORTIC REGURG.  . 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 UPPER GI ENDOSCOPY  08/20/2014   Procedure: UPPER GI ENDOSCOPY;  Surgeon: Johnathan Hausen, MD;  Location: [facility];  Service: General;;  . WRIST SURGERY Left 1997, 2009      OB History    Gravida  2   Para  2   Term  2   Preterm      AB      Living  2     SAB      TAB  Ectopic      Multiple      Live Births              Family History  Problem Relation Age of Onset  . Hypertension Mother   . Diabetes Mother   . Arthritis Mother   . Esophageal cancer Mother   . Cancer Mother   . Esophageal cancer Father 85  . Cancer Father   . Alcoholism Father   . Other Sister        kidney mass removed  . Hypertension Sister   . Stroke Sister        died in Dec 25, 2018  . Heart disease Maternal Grandmother   . Stroke Paternal Grandmother   . Coronary artery disease Neg Hx   . Colon cancer Neg Hx   . Liver disease Neg Hx   . Kidney disease Neg Hx   . Rectal cancer Neg Hx   . Stomach cancer Neg Hx     Social History   Tobacco Use  . Smoking status: Never Smoker  . Smokeless tobacco: Never Used  Vaping Use  . Vaping Use: Never used  Substance Use Topics  . Alcohol use: No  . Drug use: No    Home Medications Prior to Admission medications   Medication Sig Start Date End Date Taking? Authorizing Provider  acetaminophen (TYLENOL) 500 MG tablet Take 1,000 mg by mouth every 6 (six) hours as needed (for pain.).     [provider]  albuterol (PROVENTIL HFA;VENTOLIN HFA) 108 (90 Base) MCG/ACT inhaler Inhale 1-2 puffs into the lungs every 6 (six) hours as needed for shortness of  breath or wheezing.    [provider]  ALPRAZolam Duanne Moron) 0.25 MG tablet Take 0.125 mg by mouth as needed for anxiety.    [provider]  apixaban (ELIQUIS) 5 MG TABS tablet Take 1 tablet (5 mg total) by mouth 2 (two) times daily. 04/25/19   Lorretta Harp, MD  cetirizine (ZYRTEC) 10 MG tablet Take 10 mg by mouth daily.     [provider]  cholecalciferol (VITAMIN D) 1000 units tablet Take 1 tablet (1,000 Units total) by mouth 2 (two) times daily. Patient taking differently: Take 2,000 Units by mouth 2 (two) times daily.  10/15/16   Dennard Nip D, MD  colestipol (COLESTID) 1 g tablet Take 1 tablet (1 g total) by mouth daily. 07/20/19   Armbruster, Carlota Raspberry, MD  dicyclomine (BENTYL) 10 MG capsule Take 1 capsule (10 mg total) by mouth in the morning, at noon, and at bedtime. 08/14/19   Armbruster, Carlota Raspberry, MD  diltiazem (DILT-XR) 240 MG 24 hr capsule Take 1 capsule (240 mg total) by mouth daily. 04/25/19   Lorretta Harp, MD  DULoxetine (CYMBALTA) 60 MG capsule Take 1 capsule by mouth daily. 09/12/19   [provider]  esomeprazole (NEXIUM) 40 MG capsule Take 1 capsule (40 mg total) by mouth 2 (two) times daily before a meal. 07/24/19   Armbruster, Carlota Raspberry, MD  Loperamide HCl (IMODIUM PO) Take 1 tablet by mouth daily.    [provider]  losartan (COZAAR) 50 MG tablet Take 1 tablet by mouth daily. 09/24/19   [provider]  Melatonin 1 MG CAPS Take 1 capsule by mouth at bedtime as needed.    [provider]  methocarbamol (ROBAXIN) 500 MG tablet Take 500 mg by mouth every 6 (six) hours as needed for muscle spasms.    [provider]  Multiple Vitamin (MULTIVITAMIN WITH MINERALS) TABS tablet Take 1 tablet by mouth daily. Centrum     [provider]  ondansetron (ZOFRAN ODT) 8 MG disintegrating tablet Take 1 tablet (8 mg total) by mouth every 8 (eight) hours as needed for nausea or vomiting. Take at least once a day  07/09/19   Armbruster, Carlota Raspberry, MD  polyethylene glycol (MIRALAX) 17 g packet Take 17 g by mouth 2 (two) times daily as needed. Titrate as needed 10/10/19   Armbruster, Carlota Raspberry, MD  simvastatin (ZOCOR) 20 MG tablet Take 1 tablet (20 mg total) by mouth daily. 08/25/16   Lorretta Harp, MD  vitamin B-12 (CYANOCOBALAMIN) 1000 MCG tablet Take 1,000 mcg by mouth daily.    [provider]    Allergies    Ativan [lorazepam], Cefdinir, Buprenorphine hcl, Fentanyl, and Morphine and related  Review of Systems   Review of Systems  Constitutional: Negative for fever.  HENT: Negative for nosebleeds and sore throat.   Eyes: Negative for visual disturbance.  Respiratory: Positive for shortness of breath.   Cardiovascular: Negative for chest pain.  Gastrointestinal: Positive for abdominal pain and hematochezia. Negative for hematemesis and vomiting.  Genitourinary: Negative for dysuria.  Musculoskeletal: Positive for gait problem.  Skin: Negative for rash.  Neurological: Positive for dizziness and light-headedness. Negative for loss of consciousness.    Physical Exam Updated Vital Signs BP 122/83 (BP Location: Left Arm)   Pulse (!) 111   Temp 98.3 F (36.8 C) (Oral)   Resp 16   SpO2 98%   Physical Exam Vitals and nursing note reviewed.  Constitutional:      General: She is not in acute distress.    Appearance: She is well-developed.  HENT:     Head: Normocephalic and atraumatic.  Eyes:     Conjunctiva/sclera: Conjunctivae normal.  Cardiovascular:     Rate and Rhythm: Regular rhythm. Tachycardia present.     Pulses: Normal pulses.     Heart sounds: No murmur heard.   Pulmonary:     Effort: Pulmonary effort is normal. No respiratory distress.     Breath sounds: Normal breath sounds.  Abdominal:     Palpations: Abdomen is soft.     Tenderness: There is no abdominal tenderness. There is no guarding or rebound.  Musculoskeletal:        General: No deformity.     Cervical  back: Neck supple.  Skin:    General: Skin is warm and dry.     Capillary Refill: Capillary refill takes less than 2 seconds.  Neurological:     General: No focal deficit present.     Mental Status: She is alert. Mental status is at baseline.     ED Results / Procedures / Treatments   Labs (all labs ordered are listed, but only abnormal results are displayed) Labs Reviewed  COMPREHENSIVE METABOLIC PANEL - Abnormal; Notable for the following components:      Result Value   Glucose, Bld 106 (*)    Creatinine, Ser 1.12 (*)    GFR calc non Af Amer 49 (*)    GFR calc Af Amer 56 (*)    All other components within normal limits  CBC - Abnormal; Notable for the following components:   RBC 3.68 (*)    Hemoglobin 11.6 (*)    MCV 101.1 (*)    All other components within normal limits  BASIC METABOLIC PANEL - Abnormal; Notable for the following components:   Calcium 8.1 (*)  All other components within normal limits  CBC - Abnormal; Notable for the following components:   RBC 3.31 (*)    Hemoglobin 10.3 (*)    HCT 33.7 (*)    MCV 101.8 (*)    All other components within normal limits  SARS CORONAVIRUS 2 BY RT PCR (HOSPITAL ORDER, [facility])  PROTIME-INR  POC OCCULT BLOOD, ED  TYPE AND SCREEN  TROPONIN I (HIGH SENSITIVITY)  TROPONIN I (HIGH SENSITIVITY)    EKG EKG Interpretation  Date/Time:  Sunday October 14 2019 13:55:17 EDT Ventricular Rate:  84 PR Interval:    QRS Duration: 96 QT Interval:  375 QTC Calculation: 444 R Axis:   -82 Text Interpretation: Sinus rhythm Abnormal R-wave progression, late transition nonspecific st/ts similar to prior 2/18 Confirmed by Aletta Edouard [phone]) on 10/14/2019 2:21:47 PM   Radiology CT Abdomen Pelvis W Contrast  Result Date: 10/14/2019 CLINICAL DATA:  Rectal bleeding for a couple weeks. EXAM: CT ABDOMEN AND PELVIS WITH CONTRAST TECHNIQUE: Multidetector CT imaging of the abdomen and pelvis was performed  using the standard protocol following bolus administration of intravenous contrast. CONTRAST:  128mL OMNIPAQUE IOHEXOL 300 MG/ML  SOLN COMPARISON:  Abdominopelvic CT 06/19/2019 FINDINGS: Lower chest: Clear lung bases. No significant pleural or pericardial effusion. Stable moderate sized hiatal hernia of. 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 Hepatobiliary: The liver is normal in density without suspicious focal abnormality. Stable moderate extrahepatic biliary dilatation, likely physiologic post cholecystectomy. Pancreas: Unremarkable. No pancreatic ductal dilatation or surrounding inflammatory changes. Spleen: Normal in size without focal abnormality. Adrenals/Urinary Tract: Stable calcifications in the right adrenal gland. The left adrenal gland appears normal. No evidence of urinary tract calculus or hydronephrosis. There is a stable cyst in the upper pole of the left kidney. The bladder remains largely obscured by artifact from the hip prostheses, although demonstrates no definite abnormality. 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 There is a moderate size hiatal hernia which appears stable. The small bowel appears normal. Extensive diverticular changes are present throughout the colon without associated wall thickening or surrounding inflammation. Vascular/Lymphatic: There are no enlarged abdominal or pelvic lymph nodes. Aortic and branch vessel atherosclerosis without acute vascular findings. The portal, superior mesenteric and splenic veins are patent. Reproductive: Hysterectomy.  No adnexal mass. Other: Stable postsurgical changes in the anterior abdominal wall. No ascites. No focal extraluminal fluid collection. Musculoskeletal: No acute or significant osseous findings. Previous bilateral total hip arthroplasty. Stable degenerative changes in the spine associated with a convex right  scoliosis. IMPRESSION: 1. No acute findings or explanation for the patient's symptoms. 2. Extensive colonic diverticulosis without evidence of acute inflammation. Further GI evaluation may be warranted if the patient has persistent unexplained gastrointestinal bleeding. 3. Stable moderate sized hiatal hernia and postsurgical changes as described. 4. Aortic Atherosclerosis (ICD10-I70.0). Electronically Signed   By: Richardean Sale M.D.   On: 10/14/2019 14:59    Procedures Procedures (including critical care time)  Medications Ordered in ED Medications  colestipol (COLESTID) tablet 1 g (1 g Oral Given 10/15/19 0930)  diltiazem (CARDIZEM CD) 24 hr capsule 240 mg (240 mg Oral Given 10/15/19 0930)  losartan (COZAAR) tablet 50 mg (50 mg Oral Given 10/15/19 0931)  simvastatin (ZOCOR) tablet 20 mg (20 mg Oral Given 10/15/19 0931)  ALPRAZolam (XANAX) tablet 0.125 mg (0.125 mg Oral Given 10/15/19 0833)  DULoxetine (CYMBALTA) DR capsule 60 mg (60 mg Oral  Given 10/15/19 0931)  dicyclomine (BENTYL) capsule 10 mg (has no administration in time range)  pantoprazole (PROTONIX) EC tablet 40 mg (40 mg Oral Given 10/15/19 0931)  ondansetron (ZOFRAN-ODT) disintegrating tablet 8 mg (8 mg Oral Given 10/15/19 0933)  melatonin tablet 3 mg (has no administration in time range)  sodium chloride flush (NS) 0.9 % injection 3 mL (3 mLs Intravenous Not Given 10/15/19 0935)  acetaminophen (TYLENOL) tablet 650 mg (650 mg Oral Given 10/14/19 2338)    Or  acetaminophen (TYLENOL) suppository 650 mg ( Rectal See Alternative 10/14/19 2338)  0.9 %  sodium chloride infusion ( Intravenous Stopped 10/15/19 0815)  ketorolac (TORADOL) 15 MG/ML injection 15 mg (15 mg Intravenous Given 10/15/19 0834)  diphenhydrAMINE (BENADRYL) capsule 25 mg (25 mg Oral Given 10/15/19 0833)  acetaminophen (TYLENOL) tablet 650 mg (has no administration in time range)  0.9 %  sodium chloride infusion ( Intravenous Stopped 10/14/19 1900)  iohexol (OMNIPAQUE) 300 MG/ML  solution 100 mL (100 mLs Intravenous Contrast Given 10/14/19 1421)  ALPRAZolam (XANAX) tablet 0.5 mg (0.5 mg Oral Given 10/14/19 1920)    ED Course  I have reviewed the triage vital signs and the nursing notes.  Pertinent labs & imaging results that were available during my care of the patient were reviewed by me and considered in my medical decision making (see chart for details).  Clinical Course as of Oct 14 1145  Sun Oct 14, 2019  1328 Discussed with [facility] PA [place].  She is recommending admission, proceed with CAT scan, hold anticoagulation.  The patient can drink fluids.  Will see her today.  I reviewed this with the patient and her husband and she is in agreement with plan.   [MB]  1413 Discussed with [facility] hospitalist Dr. Neysa Bonito who will evaluate the patient for admission.   [MB]  6222 Patient had a routine EKG done for her GI bleed.  Computer is calling acute inferior infarct.  I do not feel that the EKG represent this and she has no chest pain.  Will review with [facility] attending   [MB]  1419 Reviewed with [facility] attending Dr. Tamala Julian who did not feel the EKG was concerning for an acute cardiac event.   [MB]    Clinical Course User Index [MB] Hayden Rasmussen, MD   MDM Rules/Calculators/A&P                         This patient complains of rectal bleeding on blood thinners; this involves an extensive number of treatment Options and is a complaint that carries with it a high risk of complications and Morbidity. The differential includes diverticular bleed, hemorrhoids, AVM, mass, anemia, hypotension, shock  I ordered, reviewed and interpreted labs, which included CBC with normal white count, stable low hemoglobin, chemistries normal other than some mild elevation of her creatinine, Covid testing negative, troponin not rising I ordered medication IV fluids I ordered imaging studies which included CT abdomen and pelvis and I independently    visualized and interpreted imaging  which showed diverticular disease Additional history obtained from patient's husband Previous records obtained and reviewed in epic including GI notes from this past week I consulted GI PA and [facility] hospitalist Dr. Neysa Bonito and discussed lab and imaging findings  Critical Interventions: None  After the interventions stated above, I reevaluated the patient and found patient to be hemodynamically stable.  Recommendations from GI are to hold her blood thinner and serial hemoglobins.  They  will see the patient later today for further plan.  Patient updated on recommendations and she is agreeable to plan.  CHA2DS2/VAS Stroke Risk Points  Current as of 2 minutes ago     3 >= 2 Points: High Risk  1 - 1.99 Points: Medium Risk  0 Points: Low Risk    Last Change: [name]      Details    This score determines the patient's risk of having a stroke if the  patient has atrial fibrillation.       Points Metrics  0 Has Congestive Heart Failure:  No    Current as of 2 minutes ago  0 Has Vascular Disease:  No    Current as of 2 minutes ago  1 Has Hypertension:  Yes    Current as of 2 minutes ago  1 Age:  82    Current as of 2 minutes ago  0 Has Diabetes:  No    Current as of 2 minutes ago  0 Had Stroke:  No  Had TIA:  No  Had thromboembolism:  No    Current as of 2 minutes ago  1 Female:  Yes    Current as of 2 minutes ago           Final Clinical Impression(s) / ED Diagnoses Final diagnoses:  Lower GI bleed    Rx / DC Orders ED Discharge Orders    None       Hayden Rasmussen, MD 10/15/19 1152

## 2019-10-14 NOTE — ED Triage Notes (Signed)
Pt presents with c/o rectal bleeding for a couple of weeks. Pt has seen her doctor for this and was told that if the bleeding became heavy, she needed to go to the hospital. Pt reports she woke up and felt like she needed to have diarrhea and began to pass bright red blood with dark blood clots.

## 2019-10-14 NOTE — ED Notes (Signed)
Pt placed on purewick, pad changed and chux under pt, call bell in reach. Bed repositioned and legs elevated

## 2019-10-14 NOTE — ED Notes (Signed)
Pt given chicken broth and water, husband at bedside

## 2019-10-14 NOTE — H&P (Addendum)
History and Physical        Hospital Admission Note Date: 10/14/2019  Patient name: [name] Medical record number: [id number] Date of birth: March 31, 1945 Age: 74 y.o. Gender: female  PCP: [name], MD  Patient coming from: Home Lives with: Husband At baseline, ambulates: Independently  Chief Complaint    Chief Complaint  Patient presents with  . Rectal Bleeding      HPI:   This is a 74 year old female with past medical history of atrial fibrillation on Eliquis, hypertension, diverticulosis, GI bleed and follows with Dr. Havery Moros from [place] who presented to [facility] ED on 7/25 has been having on and off rectal bleeding for the past several weeks and was initially taken off her Eliquis and was recently seen in the GI office a few days ago and had a stable Hb.  She had been set up for colonoscopy next week and was told to restart her Eliquis 2 days ago (last dose Saturday night).  She had a normal bowel movement yesterday however woke up this a.m. with sensation of having to have a BM and had dark red large amount of blood with clots.  Has had left lower quadrant discomfort and admits to some lightheadedness at standing up and shortness of breath on ambulation.  ED Course: Hemodynamically stable on room air with tachycardia.  Creatinine 1.12.  Hb 11.6 (previous 11.1 on 7/20), INR 1.1.  CTA abdomen pelvis with contrast: No acute findings, extensive colonic diverticulosis without acute inflammation.  GI was consulted.  Vitals:   10/14/19 1049 10/14/19 1452  BP: 122/83 122/83  Pulse: (!) 111 77  Resp: 16 (!) 8  Temp: 98.3 F (36.8 C) 98.3 F (36.8 C)  SpO2: 98% 98%     Review of Systems:  Review of Systems  Constitutional: Negative for chills and fever.  Respiratory: Positive for shortness of breath. Negative for cough.   Cardiovascular: Negative for chest pain  and palpitations.  Gastrointestinal: Positive for blood in stool. Negative for nausea and vomiting.  Genitourinary: Negative.   Neurological:       Lightheadedness upon standing  All other systems reviewed and are negative.   Medical/Social/Family History   Past Medical History: Past Medical History:  Diagnosis Date  . Allergy   . Anemia   . Antral polyp    benign  . Anxiety   . Arthritis   . Asthma   . B12 deficiency   . Back pain   . Chest pain   . Constipation   . Depression   . Diverticulosis   . Gallbladder problem   . GERD (gastroesophageal reflux disease) 09/09/2011  . History of transfusion   . HTN (hypertension) 09/09/2011  . Hyperlipemia 09/09/2011  . Hypertension   . IBS (irritable bowel syndrome)   . Internal hemorrhoid   . Joint pain   . Obesity   . Osteoarthritis   . Paroxysmal atrial fibrillation (HCC)   . PONV (postoperative nausea and vomiting)    with big surgeries, none with endos etc, with colonoscopy- 5-6 years ago could not swallow   . Shortness of breath   . Sleep apnea    borderline per patient no cpap   .  SOB (shortness of breath) on exertion   . Swallowing difficulty   . Tachycardia   . Unstable angina ([place]) 09/09/2011  . Vitamin D deficiency   . Vocal cord dysfunction 2013   swelling    Past Surgical History:  Procedure Laterality Date  . ABDOMINAL HYSTERECTOMY  1988  . ANKLE FUSION Right    x 2  . BACK SURGERY  [phone]   3 ruptured discs, lower back  . BILATERAL ANTERIOR TOTAL HIP ARTHROPLASTY Bilateral 11/12/2014   Procedure: BILATERAL ANTERIOR TOTAL HIP ARTHROPLASTY;  Surgeon: Paralee Cancel, MD;  Location: [facility];  Service: Orthopedics;  Laterality: Bilateral;  . BRAVO [place] STUDY [name] 03/06/2013   Procedure: BRAVO [place] STUDY;  Surgeon: Inda Castle, MD;  Location: [facility];  Service: Endoscopy;  Laterality: [name];  . CARDIAC CATHETERIZATION  2013   NORMAL  . CHOLECYSTECTOMY    . COLONOSCOPY    . ELBOW SURGERY Left     removed bone chip  . ESOPHAGOGASTRODUODENOSCOPY [name] 03/06/2013   Procedure: ESOPHAGOGASTRODUODENOSCOPY (EGD);  Surgeon: Inda Castle, MD;  Location: [facility];  Service: Endoscopy;  Laterality: [name];  . FOOT OSTEOTOMY W/ PLANTAR FASCIA RELEASE Left   . ILIOTIBIAL BAND RELEASE Right 1997  . JOINT REPLACEMENT    . LAPAROSCOPIC GASTRIC SLEEVE RESECTION WITH HIATAL HERNIA REPAIR  08/20/2014   Procedure: LAPAROSCOPIC GASTRIC SLEEVE RESECTION WITH HIATAL HERNIA  REPAIR AND  UPPER ENDOSCOPY;  Surgeon: Johnathan Hausen, MD;  Location: [facility];  Service: General;;  . LEFT HEART CATHETERIZATION WITH CORONARY ANGIOGRAM Bilateral 09/10/2011   Procedure: LEFT HEART CATHETERIZATION WITH CORONARY ANGIOGRAM;  Surgeon: Lorretta Harp, MD;  Location: [facility] CATH LAB;  Service: Cardiovascular;  Laterality: Bilateral;  . NASAL SINUS SURGERY  1997  . REVERSE SHOULDER ARTHROPLASTY Right 05/14/2016   Procedure: RIGHT REVERSE SHOULDER ARTHROPLASTY;  Surgeon: Netta Cedars, MD;  Location: [place];  Service: Orthopedics;  Laterality: Right;  . SHOULDER SURGERY Left   . tfc wrist Left 1997  . thumb replacement Bilateral    one 2009 and one 2014  . TOTAL ANKLE REPLACEMENT Right    x 3  . TOTAL ANKLE REPLACEMENT Left   . TOTAL ANKLE REPLACEMENT    . TOTAL HIP ARTHROPLASTY Bilateral   . TOTAL KNEE ARTHROPLASTY Bilateral    left 2002, right 2012  . TOTAL SHOULDER ARTHROPLASTY Right 05/14/2016  . TRANSTHORACIC ECHOCARDIOGRAM  09/09/2011   MILD CONCENTRIC LVH. TRACE MITRAL REGURG. TRACE TR. MILD AORTIC REGURG.  . 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 UPPER GI ENDOSCOPY  08/20/2014   Procedure: UPPER GI ENDOSCOPY;  Surgeon: Johnathan Hausen, MD;  Location: [facility];  Service: General;;  . WRIST SURGERY Left 1997, 2009     Medications: Prior to Admission medications   Medication Sig Start Date End Date Taking? Authorizing Provider  acetaminophen (TYLENOL) 500 MG tablet Take 1,000 mg by mouth every 6 (six) hours as  needed (for pain.).    Yes [provider]  albuterol (PROVENTIL HFA;VENTOLIN HFA) 108 (90 Base) MCG/ACT inhaler Inhale 1-2 puffs into the lungs every 6 (six) hours as needed for shortness of breath or wheezing.   Yes [provider]  ALPRAZolam (XANAX) 0.25 MG tablet Take 0.125 mg by mouth as needed for anxiety.   Yes [provider]  apixaban (ELIQUIS) 5 MG TABS tablet Take 1 tablet (5 mg total) by mouth 2 (two) times daily. 04/25/19  Yes Lorretta Harp, MD  cetirizine Alethia Berthold)  10 MG tablet Take 10 mg by mouth daily.    Yes [provider]  cholecalciferol (VITAMIN D) 1000 units tablet Take 1 tablet (1,000 Units total) by mouth 2 (two) times daily. Patient taking differently: Take 2,000 Units by mouth 2 (two) times daily.  10/15/16  Yes Beasley, Caren D, MD  colestipol (COLESTID) 1 g tablet Take 1 tablet (1 g total) by mouth daily. Patient taking differently: Take 1 g by mouth 2 (two) times daily.  07/20/19  Yes Armbruster, Carlota Raspberry, MD  dicyclomine (BENTYL) 10 MG capsule Take 1 capsule (10 mg total) by mouth in the morning, at noon, and at bedtime. Patient taking differently: Take 10 mg by mouth daily as needed for spasms.  08/14/19  Yes Armbruster, Carlota Raspberry, MD  diltiazem (DILT-XR) 240 MG 24 hr capsule Take 1 capsule (240 mg total) by mouth daily. 04/25/19  Yes Lorretta Harp, MD  DULoxetine (CYMBALTA) 60 MG capsule Take 1 capsule by mouth daily. 09/12/19  Yes [provider]  esomeprazole (NEXIUM) 40 MG capsule Take 1 capsule (40 mg total) by mouth 2 (two) times daily before a meal. 07/24/19  Yes Armbruster, Carlota Raspberry, MD  Loperamide HCl (IMODIUM PO) Take 1 tablet by mouth daily.   Yes [provider]  losartan (COZAAR) 50 MG tablet Take 1 tablet by mouth daily. 09/24/19  Yes [provider]  Melatonin 1 MG CAPS Take 1 capsule by mouth at bedtime as needed.   Yes [provider]  methocarbamol (ROBAXIN) 500 MG tablet Take 500 mg  by mouth every 6 (six) hours as needed for muscle spasms.   Yes [provider]  Multiple Vitamin (MULTIVITAMIN WITH MINERALS) TABS tablet Take 1 tablet by mouth daily. Centrum    Yes [provider]  ondansetron (ZOFRAN ODT) 8 MG disintegrating tablet Take 1 tablet (8 mg total) by mouth every 8 (eight) hours as needed for nausea or vomiting. Take at least once a day 07/09/19  Yes Armbruster, Carlota Raspberry, MD  polyethylene glycol (MIRALAX) 17 g packet Take 17 g by mouth 2 (two) times daily as needed. Titrate as needed 10/10/19  Yes Armbruster, Carlota Raspberry, MD  simvastatin (ZOCOR) 20 MG tablet Take 1 tablet (20 mg total) by mouth daily. 08/25/16  Yes Lorretta Harp, MD  vitamin B-12 (CYANOCOBALAMIN) 1000 MCG tablet Take 1,000 mcg by mouth daily.   Yes [provider]    Allergies:   Allergies  Allergen Reactions  . Ativan [Lorazepam] Other (See Comments)    Agitation, aggressive actions, significant mental changes  . Cefdinir Hives    Hives, headache  . Buprenorphine Hcl Itching and Rash  . Fentanyl Itching  . Morphine And Related Itching and Rash    Social History:  reports that she has never smoked. She has never used smokeless tobacco. She reports that she does not drink alcohol and does not use drugs.  Family History: Family History  Problem Relation Age of Onset  . Hypertension Mother   . Diabetes Mother   . Arthritis Mother   . Esophageal cancer Mother   . Cancer Mother   . Esophageal cancer Father 72  . Cancer Father   . Alcoholism Father   . Other Sister        kidney mass removed  . Hypertension Sister   . Stroke Sister        died in January 08, 2019  . Heart disease Maternal Grandmother   . Stroke Paternal Grandmother   .  Coronary artery disease Neg Hx   . Colon cancer Neg Hx   . Liver disease Neg Hx   . Kidney disease Neg Hx   . Rectal cancer Neg Hx   . Stomach cancer Neg Hx      Objective   Physical Exam: Blood pressure 122/83, pulse  77, temperature 98.3 F (36.8 C), temperature source Oral, resp. rate (!) 8, SpO2 98 %.  Physical Exam Vitals and nursing note reviewed.  Constitutional:      Appearance: Normal appearance.  HENT:     Head: Normocephalic and atraumatic.  Eyes:     Conjunctiva/sclera: Conjunctivae normal.  Cardiovascular:     Rate and Rhythm: Normal rate and regular rhythm.  Pulmonary:     Effort: Pulmonary effort is normal.     Breath sounds: Normal breath sounds.  Abdominal:     General: Abdomen is flat.     Palpations: Abdomen is soft.     Tenderness: There is abdominal tenderness in the left lower quadrant.     Comments: Left lower quadrant discomfort  Musculoskeletal:        General: No swelling or tenderness.  Skin:    Coloration: Skin is not jaundiced or pale.  Neurological:     Mental Status: She is alert. Mental status is at baseline.  Psychiatric:        Mood and Affect: Mood normal.        Behavior: Behavior normal.     LABS on Admission: I have personally reviewed all the labs and imaging below    Basic Metabolic Panel: Recent Labs  Lab 10/14/19 1147  NA 141  K 5.0  CL 105  CO2 26  GLUCOSE 106*  BUN 23  CREATININE 1.12*  CALCIUM 9.3   Liver Function Tests: Recent Labs  Lab 10/14/19 1147  AST 23  ALT 23  ALKPHOS 61  BILITOT 0.6  PROT 6.5  ALBUMIN 3.8   No results for input(s): LIPASE, AMYLASE in the last 168 hours. No results for input(s): AMMONIA in the last 168 hours. CBC: Recent Labs  Lab 10/09/19 1206 10/09/19 1206 10/14/19 1147  WBC 5.9  --  7.0  HGB 11.1*  --  11.6*  HCT 33.5*  --  37.2  MCV 95.4   < > 101.1*  PLT 212.0  --  236   < > = values in this interval not displayed.   Cardiac Enzymes: No results for input(s): CKTOTAL, CKMB, CKMBINDEX, TROPONINI in the last 168 hours. BNP: Invalid input(s): POCBNP CBG: No results for input(s): GLUCAP in the last 168 hours.  Radiological Exams on Admission:  CT Abdomen Pelvis W Contrast  Result  Date: 10/14/2019 CLINICAL DATA:  Rectal bleeding for a couple weeks. EXAM: CT ABDOMEN AND PELVIS WITH CONTRAST TECHNIQUE: Multidetector CT imaging of the abdomen and pelvis was performed using the standard protocol following bolus administration of intravenous contrast. CONTRAST:  158mL OMNIPAQUE IOHEXOL 300 MG/ML  SOLN COMPARISON:  Abdominopelvic CT 06/19/2019 FINDINGS: Lower chest: Clear lung bases. No significant pleural or pericardial effusion. Stable moderate sized hiatal hernia of. 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 Hepatobiliary: The liver is normal in density without suspicious focal abnormality. Stable moderate extrahepatic biliary dilatation, likely physiologic post cholecystectomy. Pancreas: Unremarkable. No pancreatic ductal dilatation or surrounding inflammatory changes. Spleen: Normal in size without focal abnormality. Adrenals/Urinary Tract: Stable calcifications in the right adrenal gland. The left adrenal gland appears normal. No evidence of urinary tract calculus  or hydronephrosis. There is a stable cyst in the upper pole of the left kidney. The bladder remains largely obscured by artifact from the hip prostheses, although demonstrates no definite abnormality. 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 There is a moderate size hiatal hernia which appears stable. The small bowel appears normal. Extensive diverticular changes are present throughout the colon without associated wall thickening or surrounding inflammation. Vascular/Lymphatic: There are no enlarged abdominal or pelvic lymph nodes. Aortic and branch vessel atherosclerosis without acute vascular findings. The portal, superior mesenteric and splenic veins are patent. Reproductive: Hysterectomy.  No adnexal mass. Other: Stable postsurgical changes in the anterior abdominal wall. No ascites. No focal extraluminal fluid  collection. Musculoskeletal: No acute or significant osseous findings. Previous bilateral total hip arthroplasty. Stable degenerative changes in the spine associated with a convex right scoliosis. IMPRESSION: 1. No acute findings or explanation for the patient's symptoms. 2. Extensive colonic diverticulosis without evidence of acute inflammation. Further GI evaluation may be warranted if the patient has persistent unexplained gastrointestinal bleeding. 3. Stable moderate sized hiatal hernia and postsurgical changes as described. 4. Aortic Atherosclerosis (ICD10-I70.0). Electronically Signed   By: Richardean Sale M.D.   On: 10/14/2019 14:59      EKG: Independently reviewed.  Sinus rhythm with nonspecific ST changes, similar to previous   A & P   Principal Problem:   GI bleed Active Problems:   HTN (hypertension)   Morbid obesity with BMI of 45.0-49.9, adult (HCC)   Elevated serum creatinine   1. GI bleed, suspect from diverticulosis in setting of Eliquis a. Hemodynamically stable on room air, Hb stable b. Extensive diverticulosis on CT scan without evidence of bleed c. GI on board-patient already has colonoscopy set up later this week, plan for keeping the already scheduled procedure unless acute change and then will potentially get abdominal imaging and/or colonoscopy d. Hold Eliquis e. Will keep clear liquid diet today and make n.p.o. after midnight in case if she needs to go for procedure tomorrow.  Advance diet tomorrow if he does not end up needing a procedure  2. Atrial fibrillation on Eliquis, rate controlled a. Hold Eliquis b. Continue home diltiazem  3. Hypertension a. Continue home losartan  4. Morbid obesity a. BMI 40  5. Anxiety a. Continue home Cymbalta and as needed Xanax  6. Elevated Creatinine a. Likely from hemodynamic changes  b. IV fluids  7. Lightheadedness, suspect orthostatic hypotension a. Try to obtain orthostatic vital signs prior to IV  fluids  DVT prophylaxis: SCDs   Code Status: Prior  Diet: Clear liquid diet Family Communication: Admission, patients condition and plan of care including tests being ordered have been discussed with the patient who indicates understanding and agrees with the plan and Code Status. Patient's husband at bedside was updated  Disposition Plan: The appropriate patient status for this patient is INPATIENT. Inpatient status is judged to be reasonable and necessary in order to provide the required intensity of service to ensure the patient's safety. The patient's presenting symptoms, physical exam findings, and initial radiographic and laboratory data in the context of their chronic comorbidities is felt to place them at high risk for further clinical deterioration. Furthermore, it is not anticipated that the patient will be medically stable for discharge from the hospital within 2 midnights of admission. The following factors support the patient status of inpatient.   " The patient's presenting symptoms include GI bleed. " The worrisome physical exam findings include  left lower quadrant discomfort. " The initial radiographic and laboratory data are worrisome because of extensive diverticulosis, elevated creatinine. " The chronic co-morbidities include atrial fibrillation on Eliquis.   * I certify that at the point of admission it is my clinical judgment that the patient will require inpatient hospital care spanning beyond 2 midnights from the point of admission due to high intensity of service, high risk for further deterioration and high frequency of surveillance required.*   Status is: Inpatient  Remains inpatient appropriate because:IV treatments appropriate due to intensity of illness or inability to take PO and Inpatient level of care appropriate due to severity of illness   Dispo: The patient is from: Home              Anticipated d/c is to: Home              Anticipated d/c date is: 2 days               Patient currently is not medically stable to d/c.     Consultants  . GI  Procedures  . none  Time Spent on Admission: 68 minutes    Harold Hedge, DO [facility] Pager [phone] 10/14/2019, 3:39 PM

## 2019-10-15 DIAGNOSIS — K922 Gastrointestinal hemorrhage, unspecified: Secondary | ICD-10-CM | POA: Diagnosis not present

## 2019-10-15 LAB — BASIC METABOLIC PANEL
Anion gap: 9 (ref 5–15)
BUN: 15 mg/dL (ref 8–23)
CO2: 22 mmol/L (ref 22–32)
Calcium: 8.1 mg/dL — ABNORMAL LOW (ref 8.9–10.3)
Chloride: 104 mmol/L (ref 98–111)
Creatinine, Ser: 0.74 mg/dL (ref 0.44–1.00)
GFR calc Af Amer: >60 mL/min (ref >60)
GFR calc non Af Amer: >60 mL/min (ref >60)
Glucose, Bld: 95 mg/dL (ref 70–99)
Potassium: 4 mmol/L (ref 3.5–5.1)
Sodium: 135 mmol/L (ref 135–145)

## 2019-10-15 LAB — CBC
HCT: 33.7 % — ABNORMAL LOW (ref 36.0–46.0)
Hemoglobin: 10.3 g/dL — ABNORMAL LOW (ref 12.0–15.0)
MCH: 31.1 pg (ref 26.0–34.0)
MCHC: 30.6 g/dL (ref 30.0–36.0)
MCV: 101.8 fL — ABNORMAL HIGH (ref 80.0–100.0)
Platelets: 188 10*3/uL (ref 150–400)
RBC: 3.31 MIL/uL — ABNORMAL LOW (ref 3.87–5.11)
RDW: 13.9 % (ref 11.5–15.5)
WBC: 4.8 10*3/uL (ref 4.0–10.5)
nRBC: 0 % (ref 0.0–0.2)

## 2019-10-15 MED ORDER — ACETAMINOPHEN 325 MG PO TABS: 650.0000 mg | ORAL_TABLET | Freq: Once | ORAL | Status: DC | PRN

## 2019-10-15 MED ORDER — DIPHENHYDRAMINE HCL 25 MG PO CAPS
25.0000 mg | ORAL_CAPSULE | ORAL | Status: DC | PRN
  Administered 2019-10-15 (×2): 25 mg via ORAL
  Filled 2019-10-15 (×2): qty 1

## 2019-10-15 MED ORDER — KETOROLAC TROMETHAMINE 15 MG/ML IJ SOLN
15.0000 mg | Freq: Four times a day (QID) | INTRAMUSCULAR | Status: DC | PRN
  Administered 2019-10-15 (×2): 15 mg via INTRAVENOUS
  Filled 2019-10-15 (×2): qty 1

## 2019-10-15 NOTE — Care Management Obs Status (Signed)
[place] NOTIFICATION   Patient Details  Name: [name] MRN: [id number] Date of Birth: September 30, 1945   Medicare Observation Status Notification Given:  Yes    Erenest Rasher, RN 10/15/2019, 12:05 PM

## 2019-10-15 NOTE — Progress Notes (Addendum)
Progress Note  CC:  Rectal bleeding        ASSESSMENT AND PLAN:    74 yo female with multiple medical problems not limited to AFIB on Eliquis, sleeve gastrectomy, esophageal stricture, IBS, pandiverticulosis     # Rectal bleeding --Had been seeing Dr. Havery Moros for this. Eliquis was held for short time, plan was for colonoscopy but admitted yesterday with recurrent bleeding.  --Eliquis on hold.  --Baseline Hgb 12.8 in late May. Overall since then hgb has declined to 10.3.    --Our plan is for repeat colonoscopy, she prefers to do this outpatient and is already scheduled for this Thursday. Has been unhappy with the care in hospital.   Continue to hold Eliquis in preparation for colonoscopy Thursday.   # IBS --chronic alternating bowel habits.  --negative random colon biopsies Jan 2019.     SUBJECTIVE   Upset about care received in hospital.     OBJECTIVE:     Vital signs in last 24 hours: Temp:  [98.3 F (36.8 C)] 98.3 F (36.8 C) (07/25 1741) Pulse Rate:  [77-111] 79 (07/26 1026) Resp:  [8-36] 13 (07/26 1026) BP: (122-169)/(63-114) 130/67 (07/26 1026) SpO2:  [94 %-100 %] 98 % (07/26 1026)   General:   Alert, in NAD Heart:  Regular rate and rhythm.   Pulm: Normal respiratory effort   Abdomen:  Soft,  nontender, nondistended.  Normal bowel sounds.          Neurologic:  Alert and  oriented,  grossly normal neurologically. Psych:  Pleasant, cooperative.  Normal mood and affect.   Lab Results: Recent Labs    10/14/19 1147 10/15/19 0430  WBC 7.0 4.8  HGB 11.6* 10.3*  HCT 37.2 33.7*  PLT 236 188   BMET Recent Labs    10/14/19 1147 10/15/19 0430  NA 141 135  K 5.0 4.0  CL 105 104  CO2 26 22  GLUCOSE 106* 95  BUN 23 15  CREATININE 1.12* 0.74  CALCIUM 9.3 8.1*   LFT Recent Labs    10/14/19 1147  PROT 6.5  ALBUMIN 3.8  AST 23  ALT 23  ALKPHOS 61  BILITOT 0.6   PT/INR Recent Labs    10/14/19 1313  LABPROT 13.6  INR 1.1   Hepatitis  Panel No results for input(s): HEPBSAG, HCVAB, HEPAIGM, HEPBIGM in the last 72 hours.  CT Abdomen Pelvis W Contrast  Result Date: 10/14/2019 CLINICAL DATA:  Rectal bleeding for a couple weeks. EXAM: CT ABDOMEN AND PELVIS WITH CONTRAST TECHNIQUE: Multidetector CT imaging of the abdomen and pelvis was performed using the standard protocol following bolus administration of intravenous contrast. CONTRAST:  161mL OMNIPAQUE IOHEXOL 300 MG/ML  SOLN COMPARISON:  Abdominopelvic CT 06/19/2019 FINDINGS: Lower chest: Clear lung bases. No significant pleural or pericardial effusion. Stable moderate sized hiatal hernia of. 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 Hepatobiliary: The liver is normal in density without suspicious focal abnormality. Stable moderate extrahepatic biliary dilatation, likely physiologic post cholecystectomy. Pancreas: Unremarkable. No pancreatic ductal dilatation or surrounding inflammatory changes. Spleen: Normal in size without focal abnormality. Adrenals/Urinary Tract: Stable calcifications in the right adrenal gland. The left adrenal gland appears normal. No evidence of urinary tract calculus or hydronephrosis. There is a stable cyst in the upper pole of the left kidney. The bladder remains largely obscured by artifact from the hip prostheses, although demonstrates no definite abnormality. Stomach/Bowel: As above, postsurgical changes in the stomach consistent with  prior gastric sleeve resection. There is a moderate size hiatal hernia which appears stable. The small bowel appears normal. Extensive diverticular changes are present throughout the colon without associated wall thickening or surrounding inflammation. Vascular/Lymphatic: There are no enlarged abdominal or pelvic lymph nodes. Aortic and branch vessel atherosclerosis without acute vascular findings. The portal, superior mesenteric and splenic veins are patent. Reproductive:  Hysterectomy.  No adnexal mass. Other: Stable postsurgical changes in the anterior abdominal wall. No ascites. No focal extraluminal fluid collection. Musculoskeletal: No acute or significant osseous findings. Previous bilateral total hip arthroplasty. Stable degenerative changes in the spine associated with a convex right scoliosis. IMPRESSION: 1. No acute findings or explanation for the patient's symptoms. 2. Extensive colonic diverticulosis without evidence of acute inflammation. Further GI evaluation may be warranted if the patient has persistent unexplained gastrointestinal bleeding. 3. Stable moderate sized hiatal hernia and postsurgical changes as described. 4. Aortic Atherosclerosis (ICD10-I70.0). Electronically Signed   By: Richardean Sale M.D.   On: 10/14/2019 14:59     LOS: 1 day   Tye Savoy ,NP 10/15/2019, 10:46 AM    ________________________________________________________________________  Velora Heckler GI MD note:  I personally examined the patient, reviewed the data and agree with the assessment and plan described above.  She's had pretty minor rectal bleeding in setting of a blood thinner.  We offered that she prep for colonoscopy today and undergo the test tomorrow however she is adamant that she wants to leave the hospital and have it done as an outpatient instead. She was pretty unhappy about her care here. She understands she can resume regular diet and follow prep instructions for her [place] procedure for Thursday and to return to the hospital if concerning bleeding recurs before then.   Owens Loffler, MD [facility] Pager [phone]

## 2019-10-15 NOTE — Care Management CC44 (Signed)
Condition Code 44 Documentation Completed  Patient Details  Name: [name] MRN: [id number] Date of Birth: 1945/03/28   Condition Code 44 given:  Yes Patient signature on Condition Code 44 notice:  Yes Documentation of 2 MD's agreement:  Yes Code 44 added to claim:  Yes    Erenest Rasher, RN 10/15/2019, 12:05 PM

## 2019-10-15 NOTE — ED Notes (Signed)
Pt states HA is not better, pt upset and crying in room, states she feels congested. Dr Sidney Ace messaged for orders.

## 2019-10-15 NOTE — Telephone Encounter (Signed)
Thank you. Jan, FYI, patient admitted and left the hospital in favor of having her colonoscopy with [place] later this week. She should hold Eliquis until her exam and I will await her colonoscopy result with further recommendations

## 2019-10-15 NOTE — ED Notes (Signed)
Pt c/o HA, given cold washcloth and tylenol

## 2019-10-15 NOTE — Telephone Encounter (Signed)
Patient was admitted with lower GI bleed since yesterday. Eliquis already held. Given the urgent need for GI evaluation, patient to proceed as long as her atrial fibrillation is under control. If further cardiology assistance is needed, please page the cardmaster.

## 2019-10-15 NOTE — Telephone Encounter (Signed)
Called and spoke to pt. She understands to hold Eliquis until Dr. Havery Moros instructs her to resume it when he does her procedure.

## 2019-10-15 NOTE — Discharge Instructions (Signed)
Advised to follow-up with gastroenterology on Thursday for colonoscopy.  Appointment has been scheduled. Advised to hold Eliquis until colonoscopy is completed. Advised to return to emergency room if she has recurrent rectal bleeding.

## 2019-10-15 NOTE — Discharge Summary (Signed)
Physician Discharge Summary  [name] YSA:[id number] DOB: 30-Jan-1946 DOA: 10/14/2019  PCP: [name], MD  Admit date: 10/14/2019   Discharge date: 10/15/2019  Admitted From:  Home.  Disposition: Home  Recommendations for Outpatient Follow-up:  1. Follow up with PCP in 1-2 weeks. 2. Please obtain BMP/CBC in one week. 3. Advised to follow-up with gastroenterology on Thursday for colonoscopy.  Appointment has been scheduled. 4. Advised to hold Eliquis until colonoscopy is completed. 5. Advised to return to emergency room if she has recurrent rectal bleeding.  Home Health: No  Equipment/Devices: None  Discharge Condition: Stable CODE STATUS:Full code Diet recommendation: Heart Healthy   Brief Summary / Hospital course: This is a 74 year old female with past medical history of atrial fibrillation on Eliquis, hypertension, diverticulosis, GI bleed and follows with Dr. Havery Moros from [facility] who presented to [facility] ED on 7/25,  has been having on and off rectal bleeding for the past several weeks and was initially taken off her Eliquis and was recently seen in the GI office a few days ago and had a stable Hb.  She had been set up for colonoscopy next week and was told to restart her Eliquis 2 days ago (last dose Saturday night).  She had a normal bowel movement yesterday however woke up this a.m. with sensation of having to have a BM and had dark red large amount of blood with clots.  She also reported left lower quadrant discomfort and admits to some lightheadedness at standing up and shortness of breath on ambulation. She was hemodynamically stable during hospitalization.  CTA abdomen pelvis with contrast: No acute findings, extensive colonic diverticulosis without acute inflammation.  GI was consulted, recommended observation for recurrent bleeding.  Patient was upset with the care, she received in the emergency room.  She does not have any further GI bleed in the hospital.  Overall  her hemoglobin has declined to 10.3 from 12.8 in last May.  The plan is to have a repeat colonoscopy and she prefers to do this outpatient and is already scheduled on July 29 with Dr. Frances Furbish GI.  Patient has been discharged home, advised to keep Eliquis on hold until colonoscopy is completed, advised to return to the emergency room if she has recurrent bleeding.  Patient cleared from GI to be discharged.  Discharge Diagnoses:  Principal Problem:   GI bleed Active Problems:   HTN (hypertension)   Morbid obesity with BMI of 45.0-49.9, adult (HCC)   Elevated serum creatinine  She was managed for below problems.  1. GI bleed, suspect from diverticulosis in setting of Eliquis. a. Hemodynamically stable on room air, Hb stable. b. Extensive diverticulosis on CT scan without evidence of bleed c. GI on board-patient already has colonoscopy set up later this week, plan for keeping the already scheduled procedure unless acute change and then will potentially get abdominal imaging and/or colonoscopy. d. Hold Eliquis e. Patient wanted to have colonoscopy today, states if is not going to happen today. she wants to be discharged. f. GI has seen the patient and agreed with the already scheduled plan.  2. Atrial fibrillation on Eliquis, rate controlled a. Hold Eliquis b. Continue home diltiazem  3. Hypertension a. Continue home losartan  4. Morbid obesity a. BMI 40  5. Anxiety a. Continue home Cymbalta and as needed Xanax  6. Elevated Creatinine a. Likely from hemodynamic changes  b. IV fluids  7. Lightheadedness, suspect orthostatic hypotension a. Try to obtain orthostatic vital signs prior to  IV fluids   Discharge Instructions  Discharge Instructions    Call MD for:  difficulty breathing, headache or visual disturbances   Complete by: As directed    Call MD for:  persistant dizziness or light-headedness   Complete by: As directed    Call MD for:  persistant nausea  and vomiting   Complete by: As directed    Call MD for:  temperature >100.4   Complete by: As directed    Diet - low sodium heart healthy   Complete by: As directed    Discharge instructions   Complete by: As directed    Advised to follow-up with gastroenterology on Thursday for colonoscopy.  Appointment has been scheduled. Advised to hold Eliquis until colonoscopy is completed. Advised to return to emergency room if she has recurrent rectal bleeding.   Increase activity slowly   Complete by: As directed      Allergies as of 10/15/2019      Reactions   Ativan [lorazepam] Other (See Comments)   Agitation, aggressive actions, significant mental changes   Cefdinir Hives   Hives, headache   Buprenorphine Hcl Itching, Rash   Fentanyl Itching   Morphine And Related Itching, Rash      Medication List    STOP taking these medications   apixaban 5 MG Tabs tablet Commonly known as: Eliquis     TAKE these medications   acetaminophen 500 MG tablet Commonly known as: TYLENOL Take 1,000 mg by mouth every 6 (six) hours as needed (for pain.).   albuterol 108 (90 Base) MCG/ACT inhaler Commonly known as: VENTOLIN HFA Inhale 1-2 puffs into the lungs every 6 (six) hours as needed for shortness of breath or wheezing.   ALPRAZolam 0.25 MG tablet Commonly known as: XANAX Take 0.125 mg by mouth as needed for anxiety.   cetirizine 10 MG tablet Commonly known as: ZYRTEC Take 10 mg by mouth daily.   cholecalciferol 1000 units tablet Commonly known as: VITAMIN D Take 1 tablet (1,000 Units total) by mouth 2 (two) times daily. What changed: how much to take   colestipol 1 g tablet Commonly known as: COLESTID Take 1 tablet (1 g total) by mouth daily. What changed: when to take this   dicyclomine 10 MG capsule Commonly known as: BENTYL Take 1 capsule (10 mg total) by mouth in the morning, at noon, and at bedtime. What changed:   when to take this  reasons to take this   diltiazem  240 MG 24 hr capsule Commonly known as: Dilt-XR Take 1 capsule (240 mg total) by mouth daily.   DULoxetine 60 MG capsule Commonly known as: CYMBALTA Take 1 capsule by mouth daily.   esomeprazole 40 MG capsule Commonly known as: NexIUM Take 1 capsule (40 mg total) by mouth 2 (two) times daily before a meal.   IMODIUM PO Take 1 tablet by mouth daily.   losartan 50 MG tablet Commonly known as: COZAAR Take 1 tablet by mouth daily.   Melatonin 1 MG Caps Take 1 capsule by mouth at bedtime as needed.   methocarbamol 500 MG tablet Commonly known as: ROBAXIN Take 500 mg by mouth every 6 (six) hours as needed for muscle spasms.   multivitamin with minerals Tabs tablet Take 1 tablet by mouth daily. Centrum   ondansetron 8 MG disintegrating tablet Commonly known as: Zofran ODT Take 1 tablet (8 mg total) by mouth every 8 (eight) hours as needed for nausea or vomiting. Take at least once a day  polyethylene glycol 17 g packet Commonly known as: MiraLax Take 17 g by mouth 2 (two) times daily as needed. Titrate as needed   simvastatin 20 MG tablet Commonly known as: ZOCOR Take 1 tablet (20 mg total) by mouth daily.   vitamin B-12 1000 MCG tablet Commonly known as: CYANOCOBALAMIN Take 1,000 mcg by mouth daily.       Follow-up Information    [name], MD Follow up in 1 week(s).   Specialty: Internal Medicine Contact information: [place] [address] [phone]        Lorretta Harp, MD .   Specialties: Cardiology, Radiology Contact information: [address] [place] [address] [phone]        Yetta Flock, MD Follow up in 3 day(s).   Specialty: Gastroenterology Contact information: [address] [place] [address] [phone]              Allergies  Allergen Reactions  . Ativan [Lorazepam] Other (See Comments)    Agitation, aggressive actions, significant mental changes  . Cefdinir  Hives    Hives, headache  . Buprenorphine Hcl Itching and Rash  . Fentanyl Itching  . Morphine And Related Itching and Rash    Consultations:  Gastroenterology   Procedures/Studies: CT Abdomen Pelvis W Contrast  Result Date: 10/14/2019 CLINICAL DATA:  Rectal bleeding for a couple weeks. EXAM: CT ABDOMEN AND PELVIS WITH CONTRAST TECHNIQUE: Multidetector CT imaging of the abdomen and pelvis was performed using the standard protocol following bolus administration of intravenous contrast. CONTRAST:  135mL OMNIPAQUE IOHEXOL 300 MG/ML  SOLN COMPARISON:  Abdominopelvic CT 06/19/2019 FINDINGS: Lower chest: Clear lung bases. No significant pleural or pericardial effusion. Stable moderate sized hiatal hernia of. 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 Hepatobiliary: The liver is normal in density without suspicious focal abnormality. Stable moderate extrahepatic biliary dilatation, likely physiologic post cholecystectomy. Pancreas: Unremarkable. No pancreatic ductal dilatation or surrounding inflammatory changes. Spleen: Normal in size without focal abnormality. Adrenals/Urinary Tract: Stable calcifications in the right adrenal gland. The left adrenal gland appears normal. No evidence of urinary tract calculus or hydronephrosis. There is a stable cyst in the upper pole of the left kidney. The bladder remains largely obscured by artifact from the hip prostheses, although demonstrates no definite abnormality. 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 There is a moderate size hiatal hernia which appears stable. The small bowel appears normal. Extensive diverticular changes are present throughout the colon without associated wall thickening or surrounding inflammation. Vascular/Lymphatic: There are no enlarged abdominal or pelvic lymph nodes. Aortic and branch vessel atherosclerosis without acute  vascular findings. The portal, superior mesenteric and splenic veins are patent. Reproductive: Hysterectomy.  No adnexal mass. Other: Stable postsurgical changes in the anterior abdominal wall. No ascites. No focal extraluminal fluid collection. Musculoskeletal: No acute or significant osseous findings. Previous bilateral total hip arthroplasty. Stable degenerative changes in the spine associated with a convex right scoliosis. IMPRESSION: 1. No acute findings or explanation for the patient's symptoms. 2. Extensive colonic diverticulosis without evidence of acute inflammation. Further GI evaluation may be warranted if the patient has persistent unexplained gastrointestinal bleeding. 3. Stable moderate sized hiatal hernia and postsurgical changes as described. 4. Aortic Atherosclerosis (ICD10-I70.0). Electronically Signed   By: Richardean Sale M.D.   On: 10/14/2019 14:59       Subjective: Patient was seen and examined at bedside.  Denies any overnight bleeding.  She is frustrated with her stay in the ED.  She wants to sign AMA if no colonoscopy is scheduled today.  She is hemodynamically stable, denies any abdominal pain.  Patient was seen by GI,  Patient want to leave and want to have colonoscopy that is scheduled on Thursday.  She does not want to stay for colonoscopy tomorrow.  Discharge Exam: Vitals:   10/15/19 1058 10/15/19 1128  BP: (!) 144/60 (!) 133/62  Pulse: 93 79  Resp: 19 12  Temp:    SpO2: 99% 100%   Vitals:   10/15/19 0954 10/15/19 1026 10/15/19 1058 10/15/19 1128  BP: (!) 148/81 (!) 130/67 (!) 144/60 (!) 133/62  Pulse: 77 79 93 79  Resp: 16 13 19 12   Temp:      TempSrc:      SpO2: 95% 98% 99% 100%    General: Pt is alert, awake, not in acute distress Cardiovascular: RRR, S1/S2 +, no rubs, no gallops Respiratory: CTA bilaterally, no wheezing, no rhonchi Abdominal: Soft, NT, ND, bowel sounds + Extremities: no edema, no cyanosis    The results of significant diagnostics  from this hospitalization (including imaging, microbiology, ancillary and laboratory) are listed below for reference.     Microbiology: Recent Results (from the past 240 hour(s))  SARS Coronavirus 2 by RT PCR (hospital order, performed in [facility] hospital lab) Nasopharyngeal Nasopharyngeal Swab     Status: None   Collection Time: 10/14/19  1:28 PM   Specimen: Nasopharyngeal Swab  Result Value Ref Range Status   SARS Coronavirus 2 NEGATIVE NEGATIVE Final    Comment: (NOTE) SARS-CoV-2 target nucleic acids are NOT DETECTED.  The SARS-CoV-2 RNA is generally detectable in upper and lower respiratory specimens during the acute phase of infection. The lowest concentration of SARS-CoV-2 viral copies this assay can detect is 250 copies / mL. A negative result does not preclude SARS-CoV-2 infection and should not be used as the sole basis for treatment or other patient management decisions.  A negative result may occur with improper specimen collection / handling, submission of specimen other than nasopharyngeal swab, presence of viral mutation(s) within the areas targeted by this assay, and inadequate number of viral copies (<250 copies / mL). A negative result must be combined with clinical observations, patient history, and epidemiological information.  Fact Sheet for Patients:   StrictlyIdeas.no  Fact Sheet for Healthcare Providers: BankingDealers.co.za  This test is not yet approved or  cleared by the [place] FDA and has been authorized for detection and/or diagnosis of SARS-CoV-2 by FDA under an Emergency Use Authorization (EUA).  This EUA will remain in effect (meaning this test can be used) for the duration of the COVID-19 declaration under Section 564(b)(1) of the Act, 21 U.S.C. section 360bbb-3(b)(1), unless the authorization is terminated or revoked sooner.  Performed at [facility], [place] [address], [place], [place] [address]      Labs: BNP (last 3 results) No results for input(s): BNP in the last 8760 hours. Basic Metabolic Panel: Recent Labs  Lab 10/14/19 1147 10/15/19 0430  NA 141 135  K 5.0 4.0  CL 105 104  CO2 26 22  GLUCOSE 106* 95  BUN 23 15  CREATININE 1.12* 0.74  CALCIUM 9.3 8.1*   Liver Function Tests: Recent Labs  Lab 10/14/19 1147  AST 23  ALT 23  ALKPHOS 61  BILITOT 0.6  PROT 6.5  ALBUMIN 3.8   No results for input(s):  LIPASE, AMYLASE in the last 168 hours. No results for input(s): AMMONIA in the last 168 hours. CBC: Recent Labs  Lab 10/09/19 1206 10/14/19 1147 10/15/19 0430  WBC 5.9 7.0 4.8  HGB 11.1* 11.6* 10.3*  HCT 33.5* 37.2 33.7*  MCV 95.4 101.1* 101.8*  PLT 212.0 236 188   Cardiac Enzymes: No results for input(s): CKTOTAL, CKMB, CKMBINDEX, TROPONINI in the last 168 hours. BNP: Invalid input(s): POCBNP CBG: No results for input(s): GLUCAP in the last 168 hours. D-Dimer No results for input(s): DDIMER in the last 72 hours. Hgb A1c No results for input(s): HGBA1C in the last 72 hours. Lipid Profile No results for input(s): CHOL, HDL, LDLCALC, TRIG, CHOLHDL, LDLDIRECT in the last 72 hours. Thyroid function studies No results for input(s): TSH, T4TOTAL, T3FREE, THYROIDAB in the last 72 hours.  Invalid input(s): FREET3 Anemia work up No results for input(s): VITAMINB12, FOLATE, FERRITIN, TIBC, IRON, RETICCTPCT in the last 72 hours. Urinalysis    Component Value Date/Time   COLORURINE YELLOW 11/07/2014 1000   APPEARANCEUR CLOUDY (A) 11/07/2014 1000   LABSPEC 1.016 11/07/2014 1000   PHURINE 6.0 11/07/2014 1000   GLUCOSEU NEGATIVE 11/07/2014 1000   HGBUR NEGATIVE 11/07/2014 1000   BILIRUBINUR NEGATIVE 11/07/2014 1000   KETONESUR NEGATIVE 11/07/2014 1000   PROTEINUR NEGATIVE 11/07/2014 1000   UROBILINOGEN 0.2 11/07/2014 1000   NITRITE NEGATIVE 11/07/2014 1000   LEUKOCYTESUR MODERATE (A) 11/07/2014 1000   Sepsis  Labs Invalid input(s): PROCALCITONIN,  WBC,  LACTICIDVEN Microbiology Recent Results (from the past 240 hour(s))  SARS Coronavirus 2 by RT PCR (hospital order, performed in [place] hospital lab) Nasopharyngeal Nasopharyngeal Swab     Status: None   Collection Time: 10/14/19  1:28 PM   Specimen: Nasopharyngeal Swab  Result Value Ref Range Status   SARS Coronavirus 2 NEGATIVE NEGATIVE Final    Comment: (NOTE) SARS-CoV-2 target nucleic acids are NOT DETECTED.  The SARS-CoV-2 RNA is generally detectable in upper and lower respiratory specimens during the acute phase of infection. The lowest concentration of SARS-CoV-2 viral copies this assay can detect is 250 copies / mL. A negative result does not preclude SARS-CoV-2 infection and should not be used as the sole basis for treatment or other patient management decisions.  A negative result may occur with improper specimen collection / handling, submission of specimen other than nasopharyngeal swab, presence of viral mutation(s) within the areas targeted by this assay, and inadequate number of viral copies (<250 copies / mL). A negative result must be combined with clinical observations, patient history, and epidemiological information.  Fact Sheet for Patients:   StrictlyIdeas.no  Fact Sheet for Healthcare Providers: BankingDealers.co.za  This test is not yet approved or  cleared by the [place] FDA and has been authorized for detection and/or diagnosis of SARS-CoV-2 by FDA under an Emergency Use Authorization (EUA).  This EUA will remain in effect (meaning this test can be used) for the duration of the COVID-19 declaration under Section 564(b)(1) of the Act, 21 U.S.C. section 360bbb-3(b)(1), unless the authorization is terminated or revoked sooner.  Performed at [facility], [place] [address], [place], [place] [address]      Time coordinating discharge:  Over 30 minutes  SIGNED:   Shawna Clamp, MD  [facility] 10/15/2019, 11:41 AM Pager   If 7PM-7AM, please contact night-coverage [email or web address]

## 2019-10-18 ENCOUNTER — Other Ambulatory Visit: Payer: Self-pay

## 2019-10-18 ENCOUNTER — Encounter: Payer: Self-pay | Admitting: Gastroenterology

## 2019-10-18 ENCOUNTER — Ambulatory Visit (AMBULATORY_SURGERY_CENTER): Payer: Medicare Other | Admitting: Gastroenterology

## 2019-10-18 VITALS — BP 148/82 | HR 75 | Temp 98.6°F | Resp 16 | Ht 64.0 in | Wt 236.0 lb

## 2019-10-18 DIAGNOSIS — Z1211 Encounter for screening for malignant neoplasm of colon: Secondary | ICD-10-CM | POA: Diagnosis not present

## 2019-10-18 DIAGNOSIS — D123 Benign neoplasm of transverse colon: Secondary | ICD-10-CM | POA: Diagnosis not present

## 2019-10-18 DIAGNOSIS — K5730 Diverticulosis of large intestine without perforation or abscess without bleeding: Secondary | ICD-10-CM

## 2019-10-18 DIAGNOSIS — K649 Unspecified hemorrhoids: Secondary | ICD-10-CM | POA: Diagnosis not present

## 2019-10-18 DIAGNOSIS — K922 Gastrointestinal hemorrhage, unspecified: Secondary | ICD-10-CM | POA: Diagnosis not present

## 2019-10-18 MED ORDER — SODIUM CHLORIDE 0.9 % IV SOLN
500.0000 mL | Freq: Once | INTRAVENOUS | Status: DC
Start: 2019-10-18 — End: 2019-10-18

## 2019-10-18 NOTE — Progress Notes (Signed)
To Pacu, VSS. Report to [email or web address] ?

## 2019-10-18 NOTE — Progress Notes (Signed)
Called to room to assist during endoscopic procedure.  Patient ID and intended procedure confirmed with present staff. Received instructions for my participation in the procedure from the performing physician.  

## 2019-10-18 NOTE — Op Note (Signed)
[facility] Patient Name: [name] Procedure Date: 10/18/2019 2:23 PM MRN: [id number] Endoscopist: Remo Lipps P. Havery Moros , MD Age: 74 Referring MD:  Date of Birth: [birth year] Gender: Female Account #: [id number] Procedure:                Colonoscopy Indications:              Recurrent intermittent rectal bleeding, on Eliquis,                            bleeding has stopped with holding Eliquis, recent                            hospitalization, negative CT without active bleeding Medicines:                Monitored Anesthesia Care Procedure:                Pre-Anesthesia Assessment:                           - Prior to the procedure, a History and Physical                            was performed, and patient medications and                            allergies were reviewed. The patient's tolerance of                            previous anesthesia was also reviewed. The risks                            and benefits of the procedure and the sedation                            options and risks were discussed with the patient.                            All questions were answered, and informed consent                            was obtained. Prior Anticoagulants: The patient has                            taken Eliquis (apixaban), last dose was 5 days                            prior to procedure. ASA Grade Assessment: III - A                            patient with severe systemic disease. After                            reviewing the risks and benefits, the patient was  deemed in satisfactory condition to undergo the                            procedure.                           After obtaining informed consent, the colonoscope                            was passed under direct vision. Throughout the                            procedure, the patient's blood pressure, pulse, and                            oxygen saturations were monitored  continuously. The                            Colonoscope was introduced through the anus and                            advanced to the the terminal ileum, with                            identification of the appendiceal orifice and IC                            valve. The colonoscopy was performed without                            difficulty. The patient tolerated the procedure                            well. The quality of the bowel preparation was                            adequate. The terminal ileum, ileocecal valve,                            appendiceal orifice, and rectum were photographed. Scope In: 2:42:20 PM Scope Out: 3:03:43 PM Scope Withdrawal Time: 0 hours 16 minutes 44 seconds  Total Procedure Duration: 0 hours 21 minutes 23 seconds  Findings:                 The perianal and digital rectal examinations were                            normal.                           The terminal ileum appeared normal.                           Multiple medium-mouthed diverticula were found in  the entire colon. Lavaged extensively, no stigmata                            for bleeding.                           Three sessile polyps were found in the transverse                            colon. The polyps were 2 to 3 mm in size. These                            polyps were removed with a cold biopsy forceps.                            Resection and retrieval were complete.                           Internal hemorrhoids were found during                            retroflexion. The hemorrhoids were small.                           The exam was otherwise without abnormality. Brown                            stool noted throughout the colon - no blood noted                            anywhere. Complications:            No immediate complications. Estimated blood loss:                            Minimal. Estimated Blood Loss:     Estimated blood loss was  minimal. Impression:               - The examined portion of the ileum was normal.                           - Diverticulosis in the entire examined colon.                           - Three 2 to 3 mm polyps in the transverse colon,                            removed with a cold biopsy forceps. Resected and                            retrieved.                           - Internal hemorrhoids, small.                           -  The examination was otherwise normal. Brown stool                            throughout, no heme appreciated.                           Suspect the patient has had low grade stuttering                            diverticular bleed, hopefully resolved with holding                            Eliquis for the past 5 days. Recommendation:           - Patient has a contact number available for                            emergencies. The signs and symptoms of potential                            delayed complications were discussed with the                            patient. Return to normal activities tomorrow.                            Written discharge instructions were provided to the                            patient.                           - Resume previous diet.                           - Continue present medications.                           - Await pathology results.                           - Will discuss resuming Eliquis with patient, to                            start either today or in the next few days and                            monitoring closely given her recurrent bleeding Remo Lipps P. Phat Dalton, MD 10/18/2019 3:10:37 PM This report has been signed electronically.

## 2019-10-18 NOTE — Patient Instructions (Signed)
HANDOUTS PROVIDED ON: POLYPS, DIVERTICULOSIS, & HEMORRHOIDS  The polyps removed today have been sent for pathology.  The results can take 1-3 weeks to receive.  When your next colonoscopy should occur will be based on the pathology results.    You may resume your previous diet and medication schedule.  As discussed you may resume your Eliquis over the weekend.  Thank you for allowing [place] to care for you today!!!   YOU HAD AN ENDOSCOPIC PROCEDURE TODAY AT [facility]:   Refer to the procedure report that was given to you for any specific questions about what was found during the examination.  If the procedure report does not answer your questions, please call your gastroenterologist to clarify.  If you requested that your care partner not be given the details of your procedure findings, then the procedure report has been included in a sealed envelope for you to review at your convenience later.  YOU SHOULD EXPECT: Some feelings of bloating in the abdomen. Passage of more gas than usual.  Walking can help get rid of the air that was put into your GI tract during the procedure and reduce the bloating. If you had a lower endoscopy (such as a colonoscopy or flexible sigmoidoscopy) you may notice spotting of blood in your stool or on the toilet paper. If you underwent a bowel prep for your procedure, you may not have a normal bowel movement for a few days.  Please Note:  You might notice some irritation and congestion in your nose or some drainage.  This is from the oxygen used during your procedure.  There is no need for concern and it should clear up in a day or so.  SYMPTOMS TO REPORT IMMEDIATELY:   Following lower endoscopy (colonoscopy or flexible sigmoidoscopy):  Excessive amounts of blood in the stool  Significant tenderness or worsening of abdominal pains  Swelling of the abdomen that is new, acute  Fever of 100F or higher  For urgent or emergent issues, a  gastroenterologist can be reached at any hour by calling [phone]. Do not use MyChart messaging for urgent concerns.    DIET:  We do recommend a small meal at first, but then you may proceed to your regular diet.  Drink plenty of fluids but you should avoid alcoholic beverages for 24 hours.  ACTIVITY:  You should plan to take it easy for the rest of today and you should NOT DRIVE or use heavy machinery until tomorrow (because of the sedation medicines used during the test).    FOLLOW UP: Our staff will call the number listed on your records 48-72 hours following your procedure to check on you and address any questions or concerns that you may have regarding the information given to you following your procedure. If we do not reach you, we will leave a message.  We will attempt to reach you two times.  During this call, we will ask if you have developed any symptoms of COVID 19. If you develop any symptoms (ie: fever, flu-like symptoms, shortness of breath, cough etc.) before then, please call [phone].  If you test positive for Covid 19 in the 2 weeks post procedure, please call and report this information to [place].    If any biopsies were taken you will be contacted by phone or by letter within the next 1-3 weeks.  Please call us at [phone] if you have not heard about the biopsies in 3 weeks.  SIGNATURES/CONFIDENTIALITY: You and/or your care partner have signed paperwork which will be entered into your electronic medical record.  These signatures attest to the fact that that the information above on your After Visit Summary has been reviewed and is understood.  Full responsibility of the confidentiality of this discharge information lies with you and/or your care-partner.

## 2019-10-22 ENCOUNTER — Telehealth: Payer: Self-pay

## 2019-10-22 ENCOUNTER — Telehealth: Payer: Self-pay | Admitting: *Deleted

## 2019-10-22 ENCOUNTER — Telehealth: Payer: Self-pay | Admitting: Cardiovascular Disease

## 2019-10-22 NOTE — Telephone Encounter (Signed)
Attempted to contact patient to discuss-  LVM with call back number.

## 2019-10-22 NOTE — Telephone Encounter (Signed)
Pt c/o medication issue:  1. Name of Medication: eliquis 5 mg  2. How are you currently taking this medication (dosage and times per day)? 1 tablet twice a day  3. Are you having a reaction (difficulty breathing--STAT)? no  4. What is your medication issue? Patient states she had rectal bleeding and was told to stop taking the eliquis by her gastrologist. She states she started taking it again and would like to know if she needs to continue taking it. She states every time she stops the medication her bleeding stops.

## 2019-10-22 NOTE — Telephone Encounter (Signed)
  Follow up Call-  Call back number 10/18/2019 04/23/2019 04/15/2017  Post procedure Call Back phone  # [phone]  Permission to leave phone message Yes Yes Yes  Some recent data might be hidden     Patient questions:  Do you have a fever, pain , or abdominal swelling? No. Pain Score  0 *  Have you tolerated food without any problems? Yes.    Have you been able to return to your normal activities? Yes.    Do you have any questions about your discharge instructions: Diet   No. Medications  No. Follow up visit  No.  Do you have questions or concerns about your Care? No.  Actions: * If pain score is 4 or above: No action needed, pain <4.  1. Have you developed a fever since your procedure? no  2.   Have you had an respiratory symptoms (SOB or cough) since your procedure? no  3.   Have you tested positive for COVID 19 since your procedure no  4.   Have you had any family members/close contacts diagnosed with the COVID 19 since your procedure?  no   If yes to any of these questions please route to Joylene John, RN and Erenest Rasher, RN

## 2019-10-22 NOTE — Telephone Encounter (Signed)
First post procedure follow up call, no answer 

## 2019-10-23 ENCOUNTER — Telehealth: Payer: Self-pay | Admitting: Cardiovascular Disease

## 2019-10-23 DIAGNOSIS — K922 Gastrointestinal hemorrhage, unspecified: Secondary | ICD-10-CM | POA: Diagnosis not present

## 2019-10-23 DIAGNOSIS — E785 Hyperlipidemia, unspecified: Secondary | ICD-10-CM | POA: Diagnosis not present

## 2019-10-23 DIAGNOSIS — I480 Paroxysmal atrial fibrillation: Secondary | ICD-10-CM | POA: Diagnosis not present

## 2019-10-23 DIAGNOSIS — F331 Major depressive disorder, recurrent, moderate: Secondary | ICD-10-CM | POA: Diagnosis not present

## 2019-10-23 DIAGNOSIS — I10 Essential (primary) hypertension: Secondary | ICD-10-CM | POA: Diagnosis not present

## 2019-10-23 NOTE — Telephone Encounter (Signed)
Returned pt call related to her Elliquis and GI Bleeding. Spoke with PharmD she held medication for 7 days and just restarted on 8/1. This morning she noticed some bright red streaking when wiping.  Reviewed with PharmD with orders to hold for additional 3 days to allow for healing then restart. Pt going to restart medication on Friday PM 8/6 and then call office on Monday/Tuesday next week to let us know how she is doing. She also needs appt with PharmD as follow up and we will setup at that during that call. Pt agrees with plan no additional questions at this time.

## 2019-10-23 NOTE — Telephone Encounter (Signed)
Pt is calling and want to speak with Dr. Kennon Holter nurse. Says that she called yesterday but no one called her back and its very important that she speaks with someone. Please call back to discuss

## 2019-10-25 NOTE — Telephone Encounter (Signed)
Left message for pt to call.

## 2019-10-29 NOTE — Progress Notes (Addendum)
Patient ID: [name]                 DOB: 07/31/1945                      MRN: [id number]     HPI: [name] is a 74 y.o. female referred by Dr. [name] to HTN clinic and for anticoagulation counseling. PMH is significant for A. fib on Eliquis (CHADS2VASC = 3 from HTN, age, female), hypertension, gastric sleeve (2015), and intermittent GI bleed.  Recent ER visit on 10/14/2019 due to rectal bleeding. Patient reported on and off rectal bleeding for the last few weeks and had a dark red large amount of blood with clot. Also reported abdominal pain, shortness of breath, and lightheadedness when standing. Denied syncope and other sources of bleeding. Hemoglobin was 11.6 g/dL. CT abdomen and pelvis showed no acute findings for patient's symptoms; additionally, CT showed aortic atherosclerosis and extensive colonic diverticulosis without evidence of acute inflammation. Per gastroenterologist, Eliquis was held for 7 days.   Most recently on 8/3, patient reported bright red streaking when wiping the rectal area and was instructed to hold Eliquis for an additional 3 days, with a restart date set for Friday PM 8/6. Patient also reported that every time she stops Eliquis, her bleeding also stops.  Last BP clinic visit was 06/05/19 with pharmacy and clinic BP was at goal compared to previous visits. Continued on diltiazem 240 mg daily and losartan 50 mg daily. Most recent documentation of BP was above goal at 148/82 on 10/18/19 and no changes were made.   Patient presents today in good spirits. Reports restarting Eliquis on Friday August 6th. Also reports constipation since her colonoscopy on 7/29 and had her first bowel movement yesterday (8/9) with no bleeding. She has been using dulcolax and stool softeners. Patient reports her gastrologist said her colonoscopy Green small internal hemorrhoids. Regarding BP, patient reports compliance with losartan and diltiazem. Reports dizziness, however pt believes  this could be related to her inner ear problem which she has an appointment scheduled for 8/23. Also reports shortness of breath and infrequent headaches, but denies blurry vision and history of swelling. Patient provided two home BP readings 115/75 on 7/21 and 132/76 on 8/9 and brought her BP machine to today's visit.   Current HTN meds: Losartan 50 mg daily, Diltiazem (Cartin XL) 240 mg daily (for Afib) Previously tried: Clonidine 0.1 mg TID PRN, HCTZ 12.5 mg daily BP goal: < 130/80 mg/dL  Family History: father and mother died at 46 and 66 respectively, both from esophageal cancer, sister died from stroke, had cardiomyopathy.  Has 2 children, not sure if they have any cardiac issues.   Social History: denies tobacco, alcohol or caffeine  Diet: Only Propel water or unsweetened tea for liquids; eats out regularly,rare deep fried foods. Enjoys veggies, only eats occasional deep fried foods.  Had Gastric sleeve in 2015, was 280 lb at time of surgery, now fluctuates between about 220-240 lb.  Was as low as 190 for about 2-3 years.  Exercise: no regular exercise; her church has a walking track, she is thinking about going to use that.   Home BP readings:  10/10/19 - 115/75, HR 88 10/29/19 - 132/76 , HR 95  Wt Readings from Last 3 Encounters:  10/18/19 (!) 236 lb (107 kg)  10/10/19 236 lb 12.8 oz (107.4 kg)  08/14/19 243 lb (110.2 kg)   BP Readings from Last 3 Encounters:  10/30/19 104/68  10/18/19 (!) 148/82  10/15/19 (!) 133/62   Pulse Readings from Last 3 Encounters:  10/30/19 89  10/18/19 75  10/15/19 79    Renal function: Estimated Creatinine Clearance: 74.7 mL/min (by C-G formula based on SCr of 0.74 mg/dL).  Past Medical History:  Diagnosis Date  . Allergy   . Anemia   . Antral polyp    benign  . Anxiety   . Arthritis   . Asthma   . B12 deficiency   . Back pain   . Chest pain   . Constipation   . Depression   . Diverticulosis   . Gallbladder problem   . GERD  (gastroesophageal reflux disease) 09/09/2011  . History of transfusion   . HTN (hypertension) 09/09/2011  . Hyperlipemia 09/09/2011  . Hypertension   . IBS (irritable bowel syndrome)   . Internal hemorrhoid   . Joint pain   . Obesity   . Osteoarthritis   . Paroxysmal atrial fibrillation (HCC)   . PONV (postoperative nausea and vomiting)    with big surgeries, none with endos etc, with colonoscopy- 5-6 years ago could not swallow   . Shortness of breath   . Sleep apnea    borderline per patient no cpap   . SOB (shortness of breath) on exertion   . Swallowing difficulty   . Tachycardia   . Unstable angina ([place]) 09/09/2011  . Vitamin D deficiency   . Vocal cord dysfunction 2013   swelling    Current Outpatient Medications on File Prior to Visit  Medication Sig Dispense Refill  . acetaminophen (TYLENOL) 500 MG tablet Take 1,000 mg by mouth every 6 (six) hours as needed (for pain.).     [name] albuterol (PROVENTIL HFA;VENTOLIN HFA) 108 (90 Base) MCG/ACT inhaler Inhale 1-2 puffs into the lungs every 6 (six) hours as needed for shortness of breath or wheezing.    [name] ALPRAZolam (XANAX) 0.25 MG tablet Take 0.125 mg by mouth as needed for anxiety.    . cetirizine (ZYRTEC) 10 MG tablet Take 10 mg by mouth daily.     . cholecalciferol (VITAMIN D) 1000 units tablet Take 1 tablet (1,000 Units total) by mouth 2 (two) times daily. (Patient taking differently: Take 2,000 Units by mouth 2 (two) times daily. ) 60 tablet 0  . colestipol (COLESTID) 1 g tablet Take 1 tablet (1 g total) by mouth daily. (Patient taking differently: Take 1 g by mouth 2 (two) times daily. ) 60 tablet 3  . dicyclomine (BENTYL) 10 MG capsule Take 1 capsule (10 mg total) by mouth in the morning, at noon, and at bedtime. (Patient taking differently: Take 10 mg by mouth daily as needed for spasms. ) 90 capsule 3  . diltiazem (DILT-XR) 240 MG 24 hr capsule Take 1 capsule (240 mg total) by mouth daily. 90 capsule 3  . DULoxetine (CYMBALTA)  60 MG capsule Take 1 capsule by mouth daily.    [name] esomeprazole (NEXIUM) 40 MG capsule Take 1 capsule (40 mg total) by mouth 2 (two) times daily before a meal. 60 capsule 2  . Loperamide HCl (IMODIUM PO) Take 1 tablet by mouth daily.    [name] losartan (COZAAR) 50 MG tablet Take 1 tablet by mouth daily.    . Melatonin 1 MG CAPS Take 1 capsule by mouth at bedtime as needed.    . methocarbamol (ROBAXIN) 500 MG tablet Take 500 mg by mouth every 6 (six) hours as needed for muscle spasms.    [name]  Multiple Vitamin (MULTIVITAMIN WITH MINERALS) TABS tablet Take 1 tablet by mouth daily. Centrum     . ondansetron (ZOFRAN ODT) 8 MG disintegrating tablet Take 1 tablet (8 mg total) by mouth every 8 (eight) hours as needed for nausea or vomiting. Take at least once a day 90 tablet 1  . polyethylene glycol (MIRALAX) 17 g packet Take 17 g by mouth 2 (two) times daily as needed. Titrate as needed 14 each 0  . simvastatin (ZOCOR) 20 MG tablet Take 1 tablet (20 mg total) by mouth daily. 30 tablet 11  . vitamin B-12 (CYANOCOBALAMIN) 1000 MCG tablet Take 1,000 mcg by mouth daily.     No current facility-administered medications on file prior to visit.    Allergies  Allergen Reactions  . Ativan [Lorazepam] Other (See Comments)    Agitation, aggressive actions, significant mental changes  . Cefdinir Hives    Hives, headache  . Buprenorphine Hcl Itching and Rash  . Fentanyl Itching  . Morphine And Related Itching and Rash     Assessment/Plan:  1. Hypertension - BP near goal of < 130/34mmHg. Continue losartan 50mg  daily and diltiazem 240mg  daily. Manual clinic BP reading was 118/88, however patient's BP machine produced two error readings and a reading of 96/68 and 104/68 mmHg. Patient has been having BP machine since 2016. Encouraged patient to buy a new BP machine and to check BP at least once every day, write down the BP and HR readings and to bring to the next follow-up visit. Discussed anticoag with Dr [name] who  recommends that pt continue on Eliquis to prevent a stroke and to keep up bowel habits with stool softeners etc to minimize hemorrhoid flares. Also recommended OTC Preparation H for hemorrhoids and to follow-up with her gastroenterologist. Follow-up HTN visit scheduled for two weeks on August 24th at 1:30PM.   Lorel Monaco, PharmD PGY2 [facility] [address] [place], [place] [address] Phone: [phone]; Fax: [phone]

## 2019-10-30 ENCOUNTER — Other Ambulatory Visit: Payer: Self-pay

## 2019-10-30 ENCOUNTER — Ambulatory Visit (INDEPENDENT_AMBULATORY_CARE_PROVIDER_SITE_OTHER): Payer: Medicare Other | Admitting: Pharmacist

## 2019-10-30 VITALS — BP 104/68 | HR 89

## 2019-10-30 DIAGNOSIS — I10 Essential (primary) hypertension: Secondary | ICD-10-CM

## 2019-10-30 NOTE — Patient Instructions (Addendum)
It was nice to see you today!  Your goal blood pressure is <130/80 mmHg. In clinic, your blood pressure was 118/88 mmHg.  Medication Changes:  Continue Losartan 50 mg daily and Cartin XL 240 mg daily  Monitor blood pressure at home daily and keep a log (on your phone or piece of paper) to bring with you to your next visit. Write down date, time, blood pressure and pulse.  Keep up the good work with diet and exercise. Aim for a diet full of vegetables, fruit and lean meats (chicken, [place], fish). Try to limit salt intake by eating fresh or frozen vegetables (instead of canned), rinse canned vegetables prior to cooking and do not add any additional salt to meals.   Follow up appointment on August 24th at 1:30PM  Please give [place] a call at [phone] with any questions or concerns.

## 2019-10-30 NOTE — Telephone Encounter (Signed)
Left message for pt to call.

## 2019-11-01 MED ORDER — APIXABAN 5 MG PO TABS: 5.0000 mg | ORAL_TABLET | Freq: Two times a day (BID) | ORAL | 5 refills | Status: DC

## 2019-11-01 NOTE — Addendum Note (Signed)
Addended by: Khristin Keleher E on: 11/01/2019 08:43 AM   Modules accepted: Orders

## 2019-11-05 DIAGNOSIS — R42 Dizziness and giddiness: Secondary | ICD-10-CM | POA: Diagnosis not present

## 2019-11-05 DIAGNOSIS — K922 Gastrointestinal hemorrhage, unspecified: Secondary | ICD-10-CM | POA: Diagnosis not present

## 2019-11-05 DIAGNOSIS — D6869 Other thrombophilia: Secondary | ICD-10-CM | POA: Diagnosis not present

## 2019-11-05 DIAGNOSIS — I10 Essential (primary) hypertension: Secondary | ICD-10-CM | POA: Diagnosis not present

## 2019-11-05 DIAGNOSIS — I480 Paroxysmal atrial fibrillation: Secondary | ICD-10-CM | POA: Diagnosis not present

## 2019-11-05 DIAGNOSIS — D649 Anemia, unspecified: Secondary | ICD-10-CM | POA: Diagnosis not present

## 2019-11-05 DIAGNOSIS — E785 Hyperlipidemia, unspecified: Secondary | ICD-10-CM | POA: Diagnosis not present

## 2019-11-12 DIAGNOSIS — R42 Dizziness and giddiness: Secondary | ICD-10-CM | POA: Diagnosis not present

## 2019-11-12 NOTE — Progress Notes (Signed)
Patient ID: [name]                 DOB: [birth year]                      MRN: [id number]     HPI: [name] is a 74 y.o. female referred by Dr. Gwenlyn Found to HTN clinic. PMH is significant for A. fib on diltiazem and Eliquis (CHADS2VASC = 3 from HTN, age, female), hypertension, gastric sleeve (2015), and intermittent GI bleed.  Last HTN clinic visit was 10/30/19 and BP was at goal. Patient reported shortness of breath and infrequent headaches, but denied blurry vision and swelling. Also reported dizziness, however pt believed this could be related to her inner ear problem. Patient continued on losartan 50 mg daily and diltiazem 240 mg daily. In addition, requested patient to purchase new BP machine as current BP machine produced error readings when brought into clinic.  Patient presents today in good spirits. Reports medication adherence with losartan and diltiazem in the mornings. Reports no changes in shortness of breath with exertion and currently waiting on results of inner ear balance test regarding cause of dizziness. Home BP readings range from 120s-130s/70-80s with one high of 170/94 (see below). HR ranges from 74-111. These home readings are checked in the evenings between 7PM - 12:00AM.   Current HTN meds: losartan 50 mg daily, diltiazem (Cartin XL) 240 mg daily (for Afib) Previously tried: clonidine 0.1 mg TID PRN, HCTZ 12.5 mg daily BP goal: < 130/80 mmHg  Family History: father and mother died at 35 and 71 respectively, both from esophageal cancer, sister died from stroke, had cardiomyopathy. Has 2 children, not sure if they have any cardiac issues.   Social History: denies tobacco, alcohol or caffeine  Diet: Only Propel water or unsweetened tea for liquids; eats out regularly,rare deep fried foods. Enjoys veggies, only eats occasional deep fried foods. Had Gastric sleeve in 2015, was 280 lb at time of surgery, now fluctuates between about 220-240 lb. Was as low as  190 for about 2-3 years.  Exercise: no regular exercise; her church has a walking track, she is thinking about going to use that.  Home BP readings:  BP: 124/77, 126/82, 138/86, 131/85, 134/80, 139/83, 170/94, 128/69, 138/76, 143/79, 121/73 HR 74-111  Wt Readings from Last 3 Encounters:  10/18/19 (!) 236 lb (107 kg)  10/10/19 236 lb 12.8 oz (107.4 kg)  08/14/19 243 lb (110.2 kg)   BP Readings from Last 3 Encounters:  11/13/19 98/73  10/30/19 104/68  10/18/19 (!) 148/82   Pulse Readings from Last 3 Encounters:  11/13/19 98  10/30/19 89  10/18/19 75    Renal function: CrCl cannot be calculated (Patient's most recent lab result is older than the maximum 21 days allowed.).  Past Medical History:  Diagnosis Date  . Allergy   . Anemia   . Antral polyp    benign  . Anxiety   . Arthritis   . Asthma   . B12 deficiency   . Back pain   . Chest pain   . Constipation   . Depression   . Diverticulosis   . Gallbladder problem   . GERD (gastroesophageal reflux disease) 09/09/2011  . History of transfusion   . HTN (hypertension) 09/09/2011  . Hyperlipemia 09/09/2011  . Hypertension   . IBS (irritable bowel syndrome)   . Internal hemorrhoid   . Joint pain   . Obesity   . Osteoarthritis   .  Paroxysmal atrial fibrillation (HCC)   . PONV (postoperative nausea and vomiting)    with big surgeries, none with endos etc, with colonoscopy- 5-6 years ago could not swallow   . Shortness of breath   . Sleep apnea    borderline per patient no cpap   . SOB (shortness of breath) on exertion   . Swallowing difficulty   . Tachycardia   . Unstable angina ([place]) 09/09/2011  . Vitamin D deficiency   . Vocal cord dysfunction 2013   swelling    Current Outpatient Medications on File Prior to Visit  Medication Sig Dispense Refill  . acetaminophen (TYLENOL) 500 MG tablet Take 1,000 mg by mouth every 6 (six) hours as needed (for pain.).     [name] albuterol (PROVENTIL HFA;VENTOLIN HFA) 108 (90  Base) MCG/ACT inhaler Inhale 1-2 puffs into the lungs every 6 (six) hours as needed for shortness of breath or wheezing.    [name] ALPRAZolam (XANAX) 0.25 MG tablet Take 0.125 mg by mouth as needed for anxiety.    [name] apixaban (ELIQUIS) 5 MG TABS tablet Take 1 tablet (5 mg total) by mouth 2 (two) times daily. 60 tablet 5  . cetirizine (ZYRTEC) 10 MG tablet Take 10 mg by mouth daily.     . cholecalciferol (VITAMIN D) 1000 units tablet Take 1 tablet (1,000 Units total) by mouth 2 (two) times daily. (Patient taking differently: Take 2,000 Units by mouth 2 (two) times daily. ) 60 tablet 0  . colestipol (COLESTID) 1 g tablet Take 1 tablet (1 g total) by mouth daily. (Patient taking differently: Take 1 g by mouth 2 (two) times daily. ) 60 tablet 3  . dicyclomine (BENTYL) 10 MG capsule Take 1 capsule (10 mg total) by mouth in the morning, at noon, and at bedtime. (Patient taking differently: Take 10 mg by mouth daily as needed for spasms. ) 90 capsule 3  . diltiazem (DILT-XR) 240 MG 24 hr capsule Take 1 capsule (240 mg total) by mouth daily. 90 capsule 3  . DULoxetine (CYMBALTA) 60 MG capsule Take 1 capsule by mouth daily.    [name] esomeprazole (NEXIUM) 40 MG capsule Take 1 capsule (40 mg total) by mouth 2 (two) times daily before a meal. 60 capsule 2  . Loperamide HCl (IMODIUM PO) Take 1 tablet by mouth daily.    [name] losartan (COZAAR) 50 MG tablet Take 1 tablet by mouth daily.    . Melatonin 1 MG CAPS Take 1 capsule by mouth at bedtime as needed.    . methocarbamol (ROBAXIN) 500 MG tablet Take 500 mg by mouth every 6 (six) hours as needed for muscle spasms.    . Multiple Vitamin (MULTIVITAMIN WITH MINERALS) TABS tablet Take 1 tablet by mouth daily. Centrum     . ondansetron (ZOFRAN ODT) 8 MG disintegrating tablet Take 1 tablet (8 mg total) by mouth every 8 (eight) hours as needed for nausea or vomiting. Take at least once a day 90 tablet 1  . polyethylene glycol (MIRALAX) 17 g packet Take 17 g by mouth 2 (two) times  daily as needed. Titrate as needed 14 each 0  . simvastatin (ZOCOR) 20 MG tablet Take 1 tablet (20 mg total) by mouth daily. 30 tablet 11  . vitamin B-12 (CYANOCOBALAMIN) 1000 MCG tablet Take 1,000 mcg by mouth daily.     No current facility-administered medications on file prior to visit.    Allergies  Allergen Reactions  . Ativan [Lorazepam] Other (See Comments)    Agitation,  aggressive actions, significant mental changes  . Cefdinir Hives    Hives, headache  . Buprenorphine Hcl Itching and Rash  . Fentanyl Itching  . Morphine And Related Itching and Rash    Assessment/Plan:  1. Hypertension - BP at goal of < 130/80 mmHg. Patient brought in new BP monitor and reading was on the lower end of normal 98/73 and clinic BP was 105/77, however home SBP averages between 120s-130s. Will make no medication changes and continue losartan 50 mg daily. Patient currently checks BP in the late evenings, so instructed her to start checking BP at least 1 hour after taking morning BP medications to assess BP earlier in the day. Patient verbalized understanding. Will plan to call patient in 2 weeks to assess morning and evening BP readings. If morning readings are on the lower end of normal, may need to switch losartan from AM to PM as evening pressures are currently higher. Will also follow-up with patient about inner ear balance test results regarding cause of dizziness.   Lorel Monaco, PharmD PGY2 [facility] [address], [place], [place] [address] Phone: [phone]; Fax: [phone]

## 2019-11-13 ENCOUNTER — Other Ambulatory Visit: Payer: Self-pay

## 2019-11-13 ENCOUNTER — Ambulatory Visit (INDEPENDENT_AMBULATORY_CARE_PROVIDER_SITE_OTHER): Payer: Medicare Other | Admitting: Pharmacist

## 2019-11-13 VITALS — BP 98/73 | HR 98

## 2019-11-13 DIAGNOSIS — I10 Essential (primary) hypertension: Secondary | ICD-10-CM | POA: Diagnosis not present

## 2019-11-13 NOTE — Patient Instructions (Addendum)
It was nice to see you today!  Your goal blood pressure is <130/80 mmHg. In clinic, your blood pressure was 105/77  mmHg.  Medication Changes:  Continue Losartan 50 mg daily and Cartin XL 240 mg daily  Monitor blood pressure at home daily and keep a log (on your phone or piece of paper) to bring with you to your next visit. Write down date, time, blood pressure and pulse. -Please check you blood pressure at least 1 hour after taking morning medications  Keep up the good work with diet and exercise. Aim for a diet full of vegetables, fruit and lean meats (chicken, [place], fish). Try to limit salt intake by eating fresh or frozen vegetables (instead of canned), rinse canned vegetables prior to cooking and do not add any additional salt to meals.   Please give [place] a call at [phone] with any questions or concerns.

## 2019-11-20 DIAGNOSIS — R42 Dizziness and giddiness: Secondary | ICD-10-CM | POA: Diagnosis not present

## 2019-11-22 ENCOUNTER — Other Ambulatory Visit: Payer: Self-pay

## 2019-11-22 ENCOUNTER — Telehealth: Payer: Self-pay | Admitting: Radiology

## 2019-11-22 ENCOUNTER — Encounter: Payer: Self-pay | Admitting: Cardiology

## 2019-11-22 ENCOUNTER — Ambulatory Visit (INDEPENDENT_AMBULATORY_CARE_PROVIDER_SITE_OTHER): Payer: Medicare Other | Admitting: Cardiology

## 2019-11-22 VITALS — BP 138/84 | HR 90 | Ht 64.0 in | Wt 238.0 lb

## 2019-11-22 DIAGNOSIS — I10 Essential (primary) hypertension: Secondary | ICD-10-CM

## 2019-11-22 DIAGNOSIS — R0602 Shortness of breath: Secondary | ICD-10-CM

## 2019-11-22 DIAGNOSIS — J4520 Mild intermittent asthma, uncomplicated: Secondary | ICD-10-CM

## 2019-11-22 DIAGNOSIS — I480 Paroxysmal atrial fibrillation: Secondary | ICD-10-CM

## 2019-11-22 DIAGNOSIS — J45909 Unspecified asthma, uncomplicated: Secondary | ICD-10-CM | POA: Insufficient documentation

## 2019-11-22 DIAGNOSIS — M19012 Primary osteoarthritis, left shoulder: Secondary | ICD-10-CM | POA: Diagnosis not present

## 2019-11-22 DIAGNOSIS — M25512 Pain in left shoulder: Secondary | ICD-10-CM | POA: Diagnosis not present

## 2019-11-22 NOTE — Assessment & Plan Note (Addendum)
BMI 40.  The patient tells me she has been tested for sleep apnea in 2015 and it was negative.

## 2019-11-22 NOTE — Telephone Encounter (Signed)
Enrolled patient for a 7 day Zio XT monitor to be mailed to patients home.  

## 2019-11-22 NOTE — Assessment & Plan Note (Signed)
Paroxysmal atrial fibrillation documented by Dr Gwenlyn Found during endoscopy feb 2021- strips are not in the chart.  She has apparently remained in NSR since based on f/u EKGs.

## 2019-11-22 NOTE — Assessment & Plan Note (Signed)
She is not on a beta blocker- Dr Caron Presume increased her Diltiazem to 240 mg daily.

## 2019-11-22 NOTE — Assessment & Plan Note (Signed)
Controlled- echo 2015 showed moderate LVH, normal LVF

## 2019-11-22 NOTE — Progress Notes (Signed)
Cardiology Office Note:    Date:  11/22/2019   ID:  [name], DOB 11-15-1945, MRN [id number]  PCP:  [name], MD  Cardiologist:  Quay Burow, MD  Electrophysiologist:  None   Referring MD: [name], MD   No chief complaint on file.   History of Present Illness:    [name] is a 74 y.o. female with a hx of hypertension, reactive airway disease, and chest pain with normal coronaries at catheterization 2013.  She has a history of obesity, she is status post gastric sleeve placement in the past.  She has had problems with esophageal stricture.  She had an endoscopy in February 2021 and apparently had atrial fibrillation.  She says she brought these strips to the office when she saw Dr. Gwenlyn Found in February. I did locate these strips in the chart and I'm not convinced it was atrial fibrillation. The strips in the chart look like NSR with PACs.  Eliquis was added in February 2021.    Recently she has had some problems with lower GI bleeding and was seen in the hospital.  Her hemoglobin dropped to 10.  Colonoscopy done 10/18/2019 showed diverticulitis and internal hemorrhoids.  The patient was told to follow-up with cardiology.  She has remained off Eliquis and has been told in the past she cannot take aspirin.  The patient denies any awareness of atrial fibrillation.  She did say she had this remotely.  All of her recent EKGs going back to February 2021 showed normal sinus rhythm.  She does have some vague shortness of breath at times but cannot correlate that with tachycardia or palpitations.  Past Medical History:  Diagnosis Date  . Allergy   . Anemia   . Antral polyp    benign  . Anxiety   . Arthritis   . Asthma   . B12 deficiency   . Back pain   . Chest pain   . Constipation   . Depression   . Diverticulosis   . Gallbladder problem   . GERD (gastroesophageal reflux disease) 09/09/2011  . History of transfusion   . HTN (hypertension) 09/09/2011  .  Hyperlipemia 09/09/2011  . Hypertension   . IBS (irritable bowel syndrome)   . Internal hemorrhoid   . Joint pain   . Obesity   . Osteoarthritis   . Paroxysmal atrial fibrillation (HCC)   . PONV (postoperative nausea and vomiting)    with big surgeries, none with endos etc, with colonoscopy- 5-6 years ago could not swallow   . Shortness of breath   . Sleep apnea    borderline per patient no cpap   . SOB (shortness of breath) on exertion   . Swallowing difficulty   . Tachycardia   . Unstable angina ([place]) 09/09/2011  . Vitamin D deficiency   . Vocal cord dysfunction 2013   swelling    Past Surgical History:  Procedure Laterality Date  . ABDOMINAL HYSTERECTOMY  1988  . ANKLE FUSION Right    x 2  . BACK SURGERY  [id number]   3 ruptured discs, lower back  . BILATERAL ANTERIOR TOTAL HIP ARTHROPLASTY Bilateral 11/12/2014   Procedure: BILATERAL ANTERIOR TOTAL HIP ARTHROPLASTY;  Surgeon: Paralee Cancel, MD;  Location: [facility];  Service: Orthopedics;  Laterality: Bilateral;  . BRAVO [place] STUDY [name] 03/06/2013   Procedure: BRAVO [place] STUDY;  Surgeon: Inda Castle, MD;  Location: [facility];  Service: Endoscopy;  Laterality: [name];  . CARDIAC CATHETERIZATION  2013  NORMAL  . CHOLECYSTECTOMY    . COLONOSCOPY    . ELBOW SURGERY Left    removed bone chip  . ESOPHAGOGASTRODUODENOSCOPY [name] 03/06/2013   Procedure: ESOPHAGOGASTRODUODENOSCOPY (EGD);  Surgeon: Inda Castle, MD;  Location: [facility];  Service: Endoscopy;  Laterality: [name];  . FOOT OSTEOTOMY W/ PLANTAR FASCIA RELEASE Left   . ILIOTIBIAL BAND RELEASE Right 1997  . JOINT REPLACEMENT    . LAPAROSCOPIC GASTRIC SLEEVE RESECTION WITH HIATAL HERNIA REPAIR  08/20/2014   Procedure: LAPAROSCOPIC GASTRIC SLEEVE RESECTION WITH HIATAL HERNIA  REPAIR AND  UPPER ENDOSCOPY;  Surgeon: Johnathan Hausen, MD;  Location: [facility];  Service: General;;  . LEFT HEART CATHETERIZATION WITH CORONARY ANGIOGRAM Bilateral 09/10/2011   Procedure: LEFT HEART  CATHETERIZATION WITH CORONARY ANGIOGRAM;  Surgeon: Lorretta Harp, MD;  Location: [facility] C[facility] LAB;  Service: Cardiovascular;  Laterality: Bilateral;  . NASAL SINUS SURGERY  1997  . REVERSE SHOULDER ARTHROPLASTY Right 05/14/2016   Procedure: RIGHT REVERSE SHOULDER ARTHROPLASTY;  Surgeon: Netta Cedars, MD;  Location: [place];  Service: Orthopedics;  Laterality: Right;  . SHOULDER SURGERY Left   . tfc wrist Left 1997  . thumb replacement Bilateral    one 2009 and one 2014  . TOTAL ANKLE REPLACEMENT Right    x 3  . TOTAL ANKLE REPLACEMENT Left   . TOTAL ANKLE REPLACEMENT    . TOTAL HIP ARTHROPLASTY Bilateral   . TOTAL KNEE ARTHROPLASTY Bilateral    left 2002, right 2012  . TOTAL SHOULDER ARTHROPLASTY Right 05/14/2016  . TRANSTHORACIC ECHOCARDIOGRAM  09/09/2011   MILD CONCENTRIC LVH. TRACE MITRAL REGURG. TRACE TR. MILD AORTIC REGURG.  . 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 UPPER GI ENDOSCOPY  08/20/2014   Procedure: UPPER GI ENDOSCOPY;  Surgeon: Johnathan Hausen, MD;  Location: [facility];  Service: General;;  . WRIST SURGERY Left 1997, 2009     Current Medications: Current Meds  Medication Sig  . acetaminophen (TYLENOL) 500 MG tablet Take 1,000 mg by mouth every 6 (six) hours as needed (for pain.).   [name] albuterol (PROVENTIL HFA;VENTOLIN HFA) 108 (90 Base) MCG/ACT inhaler Inhale 1-2 puffs into the lungs every 6 (six) hours as needed for shortness of breath or wheezing.  [name] ALPRAZolam (XANAX) 0.25 MG tablet Take 0.125 mg by mouth as needed for anxiety.  [name] apixaban (ELIQUIS) 5 MG TABS tablet Take 1 tablet (5 mg total) by mouth 2 (two) times daily.  . cetirizine (ZYRTEC) 10 MG tablet Take 10 mg by mouth daily.   . cholecalciferol (VITAMIN D) 1000 units tablet Take 1 tablet (1,000 Units total) by mouth 2 (two) times daily.  . colestipol (COLESTID) 1 g tablet Take 1 g by mouth every other day.  . dicyclomine (BENTYL) 10 MG capsule Take 1 capsule (10 mg total) by mouth in the morning, at noon, and  at bedtime.  [name] diltiazem (DILT-XR) 240 MG 24 hr capsule Take 1 capsule (240 mg total) by mouth daily.  [name] DOCUSATE CALCIUM PO Take by mouth.  . DULoxetine (CYMBALTA) 60 MG capsule Take 1 capsule by mouth daily.  [name] esomeprazole (NEXIUM) 40 MG capsule Take 40 mg by mouth daily at 12 noon.  [name] losartan (COZAAR) 50 MG tablet Take 1 tablet by mouth daily.  . Melatonin 1 MG CAPS Take 1 capsule by mouth at bedtime as needed.  . Multiple Vitamin (MULTIVITAMIN WITH MINERALS) TABS tablet Take 1 tablet by mouth daily. Centrum   . ondansetron (ZOFRAN ODT) 8  MG disintegrating tablet Take 1 tablet (8 mg total) by mouth every 8 (eight) hours as needed for nausea or vomiting. Take at least once a day  . polyethylene glycol (MIRALAX) 17 g packet Take 17 g by mouth 2 (two) times daily as needed. Titrate as needed  . simvastatin (ZOCOR) 20 MG tablet Take 1 tablet (20 mg total) by mouth daily.  . vitamin B-12 (CYANOCOBALAMIN) 1000 MCG tablet Take 1,000 mcg by mouth daily.     Allergies:   Ativan [lorazepam], Cefdinir, Buprenorphine hcl, Fentanyl, and Morphine and related   Social History   Socioeconomic History  . Marital status: Married    Spouse name: Jenny Reichmann  . Number of children: 2  . Years of education: Not on file  . Highest education level: Not on file  Occupational History  . Occupation: retired    Fish farm manager: RETIRED  Tobacco Use  . Smoking status: Never Smoker  . Smokeless tobacco: Never Used  Vaping Use  . Vaping Use: Never used  Substance and Sexual Activity  . Alcohol use: No  . Drug use: No  . Sexual activity: Yes    Partners: Male  Other Topics Concern  . Not on file  Social History Narrative  . Not on file   Social Determinants of Health   Financial Resource Strain:   . Difficulty of Paying Living Expenses: Not on file  Food Insecurity:   . Worried About Charity fundraiser in the Last Year: Not on file  . Ran Out of Food in the Last Year: Not on file  Transportation Needs:   .  Lack of Transportation (Medical): Not on file  . Lack of Transportation (Non-Medical): Not on file  Physical Activity:   . Days of Exercise per Week: Not on file  . Minutes of Exercise per Session: Not on file  Stress:   . Feeling of Stress : Not on file  Social Connections:   . Frequency of Communication with Friends and Family: Not on file  . Frequency of Social Gatherings with Friends and Family: Not on file  . Attends Religious Services: Not on file  . Active Member of Clubs or Organizations: Not on file  . Attends Archivist Meetings: Not on file  . Marital Status: Not on file     Family History: The patient's family history includes Alcoholism in her father; Arthritis in her mother; Cancer in her father and mother; Diabetes in her mother; Esophageal cancer in her mother; Esophageal cancer (age of onset: 47) in her father; Heart disease in her maternal grandmother; Hypertension in her mother and sister; Other in her sister; Stroke in her paternal grandmother and sister. There is no history of Coronary artery disease, Colon cancer, Liver disease, Kidney disease, Rectal cancer, or Stomach cancer.  ROS:   Please see the history of present illness.    DJD knees  All other systems reviewed and are negative.  EKGs/Labs/Other Studies Reviewed:    The following studies were reviewed today:   EKG:  EKG is  ordered today.  The ekg ordered 10/14/2019 demonstrates NSR-72  Recent Labs: 07/10/2019: TSH 2.58 10/14/2019: ALT 23 10/15/2019: BUN 15; Creatinine, Ser 0.74; Hemoglobin 10.3; Platelets 188; Potassium 4.0; Sodium 135  Recent Lipid Panel    Component Value Date/Time   CHOL 190 11/17/2017 1230   TRIG 60 11/17/2017 1230   HDL 95 11/17/2017 1230   LDLCALC 83 11/17/2017 1230    Physical Exam:    VS:  BP  138/84   Pulse 90   Ht 5\' 4"  (1.626 m)   Wt 238 lb (108 kg)   SpO2 98%   BMI 40.85 kg/m     Wt Readings from Last 3 Encounters:  11/22/19 238 lb (108 kg)   10/18/19 (!) 236 lb (107 kg)  10/10/19 236 lb 12.8 oz (107.4 kg)     GEN: Obese caucasian female,  in no acute distress HEENT: Normal NECK: No JVD; No carotid bruits LYMPHATICS: No lymphadenopathy CARDIAC: RRR, no murmurs, rubs, gallops RESPIRATORY:  Clear to auscultation without rales, wheezing or rhonchi  ABDOMEN: Soft, non-tender, non-distended MUSCULOSKELETAL:  No edema; No deformity  SKIN: Warm and dry NEUROLOGIC:  Alert and oriented x 3 PSYCHIATRIC:  Normal affect   ASSESSMENT:    Paroxysmal atrial fibrillation (HCC) Paroxysmal atrial fibrillation suspected during endoscopy Feb 2021- in the chart appear to show NSR with PACs.  Essential hypertension Controlled- echo 2015 showed moderate LVH, normal LVF  Morbid obesity (HCC) BMI 40.  The patient tells me she has been tested for sleep apnea in 2015 and it was negative.   Reactive airway disease She is not on a beta blocker- Dr Caron Presume increased her Diltiazem to 240 mg daily.   PLAN:    I suggested we proceed with a outpatient monitor to see if she has any atrial fibrillation documented.  Also suggested she consider an apple watch.  For now she will remain off anticoagulation.  I discussed the possibility of considering a watchman device, she is not anxious for this but may consider it.  I will refer her to the AF clinic for follow-up in a few weeks to review her options. She is a CHADS VASC-3  Medication Adjustments/Labs and Tests Ordered: Current medicines are reviewed at length with the patient today.  Concerns regarding medicines are outlined above.  Orders Placed This Encounter  Procedures  . LONG TERM MONITOR (3-14 DAYS)   No orders of the defined types were placed in this encounter.   Patient Instructions  Medication Instructions:  Your physician recommends that you continue on your current medications as directed. Please refer to the Current Medication list given to you today.  *If you need a refill on  your cardiac medications before your next appointment, please call your pharmacy*  Lab Work: NONE ordered at this time of appointment   If you have labs (blood work) drawn today and your tests are completely normal, you will receive your results only by: [name] MyChart Message (if you have MyChart) OR . A paper copy in the mail If you have any lab test that is abnormal or we need to change your treatment, we will call you to review the results.   Testing/Procedures: [place]n [place]- Long Term Monitor Instructions   Your physician has requested you wear your ZIO patch monitor 7 days.   This is a single patch monitor.  Irhythm supplies one patch monitor per enrollment.  Additional stickers are not available.   Please do not apply patch if you will be having a Nuclear Stress Test, Echocardiogram, Cardiac CT, MRI, or Chest Xray during the time frame you would be wearing the monitor. The patch cannot be worn during these tests.  You cannot remove and re-apply the ZIO XT patch monitor.   Your ZIO patch monitor will be sent USPS Priority mail from [facility] directly to your home address. The monitor may also be mailed to a PO BOX if home delivery is not available.  It may take 3-5 days to receive your monitor after you have been enrolled.   Once you have received you monitor, please review enclosed instructions.  Your monitor has already been registered assigning a specific monitor serial # to you.   Applying the monitor   Shave hair from upper left chest.   Hold abrader disc by orange tab.  Rub abrader in 40 strokes over left upper chest as indicated in your monitor instructions.   Clean area with 4 enclosed alcohol pads .  Use all pads to assure are is cleaned thoroughly.  Let dry.   Apply patch as indicated in monitor instructions.  Patch will be place under collarbone on left side of chest with arrow pointing upward.   Rub patch adhesive wings for 2 minutes.Remove white label marked "1".   Remove white label marked "2".  Rub patch adhesive wings for 2 additional minutes.   While looking in a mirror, press and release button in center of patch.  A small green light will flash 3-4 times .  This will be your only indicator the monitor has been turned on.     Do not shower for the first 24 hours.  You may shower after the first 24 hours.   Press button if you feel a symptom. You will hear a small click.  Record Date, Time and Symptom in the Patient Log Book.   When you are ready to remove patch, follow instructions on last 2 pages of Patient Log Book.  Stick patch monitor onto last page of Patient Log Book.   Place Patient Log Book in [place] box.  Use locking tab on box and tape box closed securely.  The [employer] and [employer] has [employer] on it.  Please place in mailbox as soon as possible.  Your physician should have your test results approximately 7 days after the monitor has been mailed back to [facility].   Call [place] at [phone] if you have questions regarding your ZIO XT patch monitor.  Call them immediately if you see an orange light blinking on your monitor.   If your monitor falls off in less than 4 days contact our Monitor department at [phone].  If your monitor becomes loose or falls off after 4 days call [employer] at [phone] for suggestions on securing your monitor.    Follow-Up: At [facility], you and your health needs are our priority.  As part of our continuing mission to provide you with exceptional heart care, we have created designated Provider Care Teams.  These Care Teams include your primary Cardiologist (physician) and Advanced Practice Providers (APPs -  Physician Assistants and Nurse Practitioners) who all work together to provide you with the care you need, when you need it.  We recommend signing up for the patient portal called "MyChart".  Sign up information is provided on this After Visit Summary.  MyChart  is used to connect with patients for Virtual Visits (Telemedicine).  Patients are able to view lab/test results, encounter notes, upcoming appointments, etc.  Non-urgent messages can be sent to your provider as well.   To learn more about what you can do with MyChart, go to [email or web address].    Your next appointment:   4-6 week(s)  The format for your next appointment:   In Person  Provider:   Afib clinic  Other Instructions      Signed, Kerin Ransom, PA-C  11/22/2019 2:05 PM    [place]  [facility]

## 2019-11-22 NOTE — Patient Instructions (Addendum)
Medication Instructions:  Your physician recommends that you continue on your current medications as directed. Please refer to the Current Medication list given to you today.  *If you need a refill on your cardiac medications before your next appointment, please call your pharmacy*  Lab Work: NONE ordered at this time of appointment   If you have labs (blood work) drawn today and your tests are completely normal, you will receive your results only by: [name] MyChart Message (if you have MyChart) OR . A paper copy in the mail If you have any lab test that is abnormal or we need to change your treatment, we will call you to review the results.   Testing/Procedures: Bryn Gulling- Long Term Monitor Instructions   Your physician has requested you wear your ZIO patch monitor 7 days.   This is a single patch monitor.  Irhythm supplies one patch monitor per enrollment.  Additional stickers are not available.   Please do not apply patch if you will be having a Nuclear Stress Test, Echocardiogram, Cardiac CT, MRI, or Chest Xray during the time frame you would be wearing the monitor. The patch cannot be worn during these tests.  You cannot remove and re-apply the ZIO XT patch monitor.   Your ZIO patch monitor will be sent USPS Priority mail from [facility] directly to your home address. The monitor may also be mailed to a PO BOX if home delivery is not available.   It may take 3-5 days to receive your monitor after you have been enrolled.   Once you have received you monitor, please review enclosed instructions.  Your monitor has already been registered assigning a specific monitor serial # to you.   Applying the monitor   Shave hair from upper left chest.   Hold abrader disc by orange tab.  Rub abrader in 40 strokes over left upper chest as indicated in your monitor instructions.   Clean area with 4 enclosed alcohol pads .  Use all pads to assure are is cleaned thoroughly.  Let dry.   Apply  patch as indicated in monitor instructions.  Patch will be place under collarbone on left side of chest with arrow pointing upward.   Rub patch adhesive wings for 2 minutes.Remove white label marked "1".  Remove white label marked "2".  Rub patch adhesive wings for 2 additional minutes.   While looking in a mirror, press and release button in center of patch.  A small green light will flash 3-4 times .  This will be your only indicator the monitor has been turned on.     Do not shower for the first 24 hours.  You may shower after the first 24 hours.   Press button if you feel a symptom. You will hear a small click.  Record Date, Time and Symptom in the Patient Log Book.   When you are ready to remove patch, follow instructions on last 2 pages of Patient Log Book.  Stick patch monitor onto last page of Patient Log Book.   Place Patient Log Book in [place] box.  Use locking tab on box and tape box closed securely.  The [employer] and [employer] has [employer] on it.  Please place in mailbox as soon as possible.  Your physician should have your test results approximately 7 days after the monitor has been mailed back to [facility] [place].   Call [place] at [phone] if you have questions regarding your ZIO XT patch monitor.  Call them immediately if you see an orange light blinking on your monitor.   If your monitor falls off in less than 4 days contact our Monitor department at [phone].  If your monitor becomes loose or falls off after 4 days call [employer] at [phone] for suggestions on securing your monitor.    Follow-Up: At [facility], you and your health needs are our priority.  As part of our continuing mission to provide you with exceptional heart care, we have created designated Provider Care Teams.  These Care Teams include your primary Cardiologist (physician) and Advanced Practice Providers (APPs -  Physician Assistants and Nurse Practitioners) who all work  together to provide you with the care you need, when you need it.  We recommend signing up for the patient portal called "MyChart".  Sign up information is provided on this After Visit Summary.  MyChart is used to connect with patients for Virtual Visits (Telemedicine).  Patients are able to view lab/test results, encounter notes, upcoming appointments, etc.  Non-urgent messages can be sent to your provider as well.   To learn more about what you can do with MyChart, go to [email or web address].    Your next appointment:   4-6 week(s)  The format for your next appointment:   In Person  Provider:   Afib clinic  Other Instructions

## 2019-11-27 ENCOUNTER — Telehealth: Payer: Self-pay | Admitting: Pharmacist

## 2019-11-27 NOTE — Telephone Encounter (Signed)
Contacted patient regarding BP management. Reports medication adherence and no side effects with losartan 50 mg daily. Reports home BP range from 130s-140s/70-80s (listed below).   Home BP readings:  132/76 - AM  126/75 - PM  142/77 - PM  146/86 - PM  140/82 - PM  139/89 - AM  143/86 - PM  147/88 - AM  136/86 - AM  Assessment/Plan: Overall, home BP above goal <130/80 mmHg. Patient requested pharmacy to follow-up on BP after she completes her 7-day Zio XT monitor to assess atrial fibrillation. Will call patient in two weeks.  Lorel Monaco, PharmD PGY2 [place] [address] [place], [place] [address] Phone: [phone]; Fax: [phone]

## 2019-11-28 ENCOUNTER — Other Ambulatory Visit (INDEPENDENT_AMBULATORY_CARE_PROVIDER_SITE_OTHER): Payer: Medicare Other

## 2019-11-28 DIAGNOSIS — I480 Paroxysmal atrial fibrillation: Secondary | ICD-10-CM | POA: Diagnosis not present

## 2019-11-28 DIAGNOSIS — R0602 Shortness of breath: Secondary | ICD-10-CM

## 2019-12-10 ENCOUNTER — Other Ambulatory Visit: Payer: Self-pay | Admitting: Gastroenterology

## 2019-12-12 DIAGNOSIS — I480 Paroxysmal atrial fibrillation: Secondary | ICD-10-CM | POA: Diagnosis not present

## 2019-12-18 ENCOUNTER — Telehealth: Payer: Self-pay | Admitting: Pharmacist

## 2019-12-18 DIAGNOSIS — I10 Essential (primary) hypertension: Secondary | ICD-10-CM

## 2019-12-18 NOTE — Telephone Encounter (Signed)
Contacted patient regarding BP management. Reports medication adherence and no side effects with losartan 50 mg daily. Reports average SBP around 140s (124/84, 139/81, 141/68).   Assessment/Plan: Home BP above goal <130/80 mmHg. Discussed increasing losartan to 100 mg daily for additional BP control, however patient would like to wait until she goes to her Afib clinic appointment on 01/09/20 to see if any medication changes will be made. Informed patient to call [facility] with any BP concerns. Will call patient in 1 month.  Lorel Monaco, PharmD PGY2 [place] [address] [place], [place] [address] Phone: [phone]; Fax: [phone]

## 2019-12-21 ENCOUNTER — Encounter: Payer: Self-pay | Admitting: *Deleted

## 2019-12-24 ENCOUNTER — Encounter: Payer: Self-pay | Admitting: Physical Therapy

## 2019-12-24 ENCOUNTER — Other Ambulatory Visit: Payer: Self-pay | Admitting: Gastroenterology

## 2019-12-24 ENCOUNTER — Ambulatory Visit: Payer: Medicare Other | Attending: Otolaryngology | Admitting: Physical Therapy

## 2019-12-24 ENCOUNTER — Other Ambulatory Visit: Payer: Self-pay

## 2019-12-24 DIAGNOSIS — R262 Difficulty in walking, not elsewhere classified: Secondary | ICD-10-CM | POA: Diagnosis not present

## 2019-12-24 DIAGNOSIS — R296 Repeated falls: Secondary | ICD-10-CM | POA: Diagnosis not present

## 2019-12-24 DIAGNOSIS — R42 Dizziness and giddiness: Secondary | ICD-10-CM | POA: Insufficient documentation

## 2019-12-24 DIAGNOSIS — R2681 Unsteadiness on feet: Secondary | ICD-10-CM | POA: Insufficient documentation

## 2019-12-24 DIAGNOSIS — M6281 Muscle weakness (generalized): Secondary | ICD-10-CM | POA: Diagnosis not present

## 2019-12-24 NOTE — Therapy (Signed)
[facility] [address] [place], [place], [address] Phone: [phone]   Fax:  [phone]  Physical Therapy Evaluation  Patient Details  Name: [name] MRN: [id number] Date of Birth: 08-02-1945 Referring Provider (PT): Leta Baptist, MD   Encounter Date: 12/24/2019   PT End of Session - 12/24/19 2101    Visit Number 1    Number of Visits 17    Date for PT Re-Evaluation 02/22/20    Authorization Type Medicare A&B/[employer]    Progress Note Due on Visit 10    PT Start Time 1410    PT Stop Time 1450    PT Time Calculation (min) 40 min    Activity Tolerance Patient limited by pain    Behavior During Therapy [facility] for tasks assessed/performed           Past Medical History:  Diagnosis Date  . Allergy   . Anemia   . Antral polyp    benign  . Anxiety   . Arthritis   . Asthma   . B12 deficiency   . Back pain   . Chest pain   . Constipation   . Depression   . Diverticulosis   . Gallbladder problem   . GERD (gastroesophageal reflux disease) 09/09/2011  . History of transfusion   . HTN (hypertension) 09/09/2011  . Hyperlipemia 09/09/2011  . Hypertension   . IBS (irritable bowel syndrome)   . Internal hemorrhoid   . Joint pain   . Obesity   . Osteoarthritis   . Paroxysmal atrial fibrillation (HCC)   . PONV (postoperative nausea and vomiting)    with big surgeries, none with endos etc, with colonoscopy- 5-6 years ago could not swallow   . Shortness of breath   . Sleep apnea    borderline per patient no cpap   . SOB (shortness of breath) on exertion   . Swallowing difficulty   . Tachycardia   . Unstable angina ([place]) 09/09/2011  . Vitamin D deficiency   . Vocal cord dysfunction 2013   swelling    Past Surgical History:  Procedure Laterality Date  . ABDOMINAL HYSTERECTOMY  1988  . ANKLE FUSION Right    x 2  . BACK SURGERY  [phone]   3 ruptured discs, lower back  . BILATERAL ANTERIOR TOTAL HIP  ARTHROPLASTY Bilateral 11/12/2014   Procedure: BILATERAL ANTERIOR TOTAL HIP ARTHROPLASTY;  Surgeon: Paralee Cancel, MD;  Location: [facility];  Service: Orthopedics;  Laterality: Bilateral;  . BRAVO [place] STUDY [name] 03/06/2013   Procedure: BRAVO [place] STUDY;  Surgeon: Inda Castle, MD;  Location: [facility];  Service: Endoscopy;  Laterality: [name];  . CARDIAC CATHETERIZATION  2013   NORMAL  . CHOLECYSTECTOMY    . COLONOSCOPY    . ELBOW SURGERY Left    removed bone chip  . ESOPHAGOGASTRODUODENOSCOPY [name] 03/06/2013   Procedure: ESOPHAGOGASTRODUODENOSCOPY (EGD);  Surgeon: Inda Castle, MD;  Location: [facility];  Service: Endoscopy;  Laterality: [name];  . FOOT OSTEOTOMY W/ PLANTAR FASCIA RELEASE Left   . ILIOTIBIAL BAND RELEASE Right 1997  . JOINT REPLACEMENT    . LAPAROSCOPIC GASTRIC SLEEVE RESECTION WITH HIATAL HERNIA REPAIR  08/20/2014   Procedure: LAPAROSCOPIC GASTRIC SLEEVE RESECTION WITH HIATAL HERNIA  REPAIR AND  UPPER ENDOSCOPY;  Surgeon: Johnathan Hausen, MD;  Location: [facility];  Service: General;;  . LEFT HEART CATHETERIZATION WITH CORONARY ANGIOGRAM Bilateral 09/10/2011   Procedure: LEFT HEART CATHETERIZATION WITH CORONARY ANGIOGRAM;  Surgeon: Lorretta Harp,  MD;  Location: [facility] LAB;  Service: Cardiovascular;  Laterality: Bilateral;  . NASAL SINUS SURGERY  1997  . REVERSE SHOULDER ARTHROPLASTY Right 05/14/2016   Procedure: RIGHT REVERSE SHOULDER ARTHROPLASTY;  Surgeon: Netta Cedars, MD;  Location: [facility];  Service: Orthopedics;  Laterality: Right;  . SHOULDER SURGERY Left   . tfc wrist Left 1997  . thumb replacement Bilateral    one 2009 and one 2014  . TOTAL ANKLE REPLACEMENT Right    x 3  . TOTAL ANKLE REPLACEMENT Left   . TOTAL ANKLE REPLACEMENT    . TOTAL HIP ARTHROPLASTY Bilateral   . TOTAL KNEE ARTHROPLASTY Bilateral    left 2002, right 2012  . TOTAL SHOULDER ARTHROPLASTY Right 05/14/2016  . TRANSTHORACIC ECHOCARDIOGRAM  09/09/2011   MILD CONCENTRIC LVH. TRACE MITRAL REGURG.  TRACE TR. MILD AORTIC REGURG.  . 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 UPPER GI ENDOSCOPY  08/20/2014   Procedure: UPPER GI ENDOSCOPY;  Surgeon: Johnathan Hausen, MD;  Location: [facility];  Service: General;;  . WRIST SURGERY Left 1997, 2009     There were no vitals filed for this visit.    Subjective Assessment - 12/24/19 1415    Subjective Pt D/C from therapy in 2019 with resoultion of vertigo.  Pt has some mild dizziness lying supine and turning to L; not sure when the dizziness returned but pt was evaluated by ENT who told her this current episode was not true "vertigo."  Pt has fallen 3 times in less than 2 months.  Most recent fall was the worst.  First two were due to bending down and reaching forwards; third was at [facility] when she turned her head she lost her balance and fell the R side hitting her elbow and knee with significant bruising - no imaging afterwards, now having decreased sensation in R 4th and 5th fingers.  Pt denies dizziness with these falls.  Has gained 30lb this past year.  Recently started using hearing aides but did not feel she had to adjust to them.  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  Has been experiencing multiple GI bleeds due to blood thinner but must continue to take due to Afib - has f/u with cardiology to see if she is a candidate for a procedure that would prevent clots and she could stop anticoagulants.    Patient is accompained by: Family member    Limitations Standing;Walking    Diagnostic tests Vestibular function tests - central and peripheral dizziness    Patient Stated Goals Quit falling, husband's goal for her to be able to get up from the floor if she does fall    Currently in Pain? Yes    Pain Score 2     Pain Location Knee    Pain Orientation Right    Pain Descriptors / Indicators Sore    Multiple Pain Sites Yes    Pain Score 5    Pain  Location Elbow    Pain Orientation Right    Pain Descriptors / Indicators Sore              [facility] PT Assessment - 12/24/19 1428      Assessment   Medical Diagnosis Dizziness and falls    Referring Provider (PT) Leta Baptist, MD    Onset Date/Surgical Date 12/05/19    Prior Therapy yes  Precautions   Precautions Other (comment)    Precaution Comments anxiety, OA, back pain, chest pain, depression, HTN, hyperlipidemia, IBS, Afib, sleep apnea, multiple surgeries including R ankle fusion, shoulder replacement, bilat THA, back surgery, bilat TKA, and wrist surgery, obesity, bilat sensorineural hearing loss, tinnitus L ear, L ocular aneurysm with diplopia and multiple childhood ear infections, L foot osteotomy with plantar fascia release, R IT band release, bilat thumb replacement      Balance Screen   Has the patient fallen in the past 6 months Yes    How many times? 3    Has the patient had a decrease in activity level because of a fear of falling?  Yes   since pandemic and 30lb weight gain     [place] residence    Living Arrangements Spouse/significant other    Additional Comments Is still driving      Prior Function   Level of Independence Independent    Leisure Has not been walking since pandemic      Observation/Other Assessments   Focus on Therapeutic Outcomes (FOTO)  55%    Other Surveys  Dizziness Handicap Inventory (DHI)    Dizziness Handicap Inventory (DHI)  40      Sensation   Light Touch Impaired Detail    Additional Comments Decreased to light touch R 5th finger. Performed R ulnar nerve palpation with pt reporting worsening pain, tingling, numbness down to 5th finger.        ROM / Strength   AROM / PROM / Strength Strength      Strength   Overall Strength Deficits    Overall Strength Comments Unable to hold against resistance for elbow flexion on RUE; 5/5 LUE      Special Tests   Other special tests Performed valgus  stress test to L and R elbow with pain and increased valgus movement noted on RUE      Ambulation/Gait   Ambulation/Gait Yes    Ambulation/Gait Assistance 5: Supervision    Ambulation/Gait Assistance Details slow and antalgic gait due to R knee pain    Ambulation Distance (Feet) 100 Feet    Assistive device None    Gait Pattern Wide base of support;Decreased trunk rotation;Antalgic    Ambulation Surface Level;Indoor                      Objective measurements completed on examination: See above findings.               PT Education - 12/24/19 2100    Education Details significant time spent educated pt on balance sensory systems and how each of her systems are affecting her balance.  Educated pt on PT POC and goals.  Educated pt on buy in for maximum effectiveness of therapy and that 2 visits may not be enough time to see significant change.    Person(s) Educated Patient;Spouse    Methods Explanation    Comprehension Verbalized understanding            PT Short Term Goals - 12/24/19 2113      PT SHORT TERM GOAL #1   Title Pt will participate in MMT and further balance/falls risk assessment with BERG and FGA and will initiate HEP for LE strengthening and balance.    Time 4    Period Weeks    Status New    Target Date 01/23/20      PT SHORT TERM GOAL #2  Title Pt will participate in full vestibular/visual assessment and will initiate vestibular HEP and walking program    Time 4    Period Weeks    Status New    Target Date 01/23/20      PT SHORT TERM GOAL #3   Title Pt will demonstrate ability to rise from floor with supervision and UE support on furniture.    Time 4    Period Weeks    Status New    Target Date 01/23/20             PT Long Term Goals - 12/24/19 2115      PT LONG TERM GOAL #1   Title Pt will demonstrate independence with walking program and final vestibular/balance HEP    Time 8    Period Weeks    Status New    Target  Date 02/22/20      PT LONG TERM GOAL #2   Title Pt will improve FGA by 4 points to indicate decreased falls risk in the community    Time 8    Period Weeks    Status New    Target Date 02/22/20      PT LONG TERM GOAL #3   Title Pt will increase BERG by 8 points to indicate decreased falls risk.    Time 8    Period Weeks    Status New    Target Date 02/22/20      PT LONG TERM GOAL #4   Title Pt will demonstrate ability to reach down to the floor multiple times and turn/pivot to L and R without dizziness or LOB to improve safety with daily activities    Time 8    Period Weeks    Status New    Target Date 02/22/20      PT LONG TERM GOAL #5   Title Pt will ambulate outside over paved surfaces x 250' MOD I with intermittent head turns to L and R without LOB    Time 8    Period Weeks    Status New    Target Date 02/22/20      PT LONG TERM GOAL #6   Title Pt will improve function on FOTO to >67% and will decrease DHI to </= 22    Baseline 55%; 40    Time 8    Period Weeks    Status New    Target Date 02/22/20                  Plan - 12/24/19 2102    Clinical Impression Statement Pt is a 74 year old female well known to this therapist referred to Neuro OPP[facility] for evaluation of chronic dizziness and falls. Pt's PMH is significant for the following: anxiety, OA, back pain, chest pain, depression, HTN, hyperlipidemia, IBS, Afib, sleep apnea, multiple surgeries including R ankle fusion, shoulder replacement, bilat THA, back surgery, bilat TKA, and wrist surgery, obesity, bilat sensorineural hearing loss, tinnitus L ear, L ocular aneurysm with diplopia and multiple childhood ear infections, L foot osteotomy with plantar fascia release, R IT band release, bilat thumb replacement. The following deficits were noted during pt's exam: pain in R elbow and R knee with ulnar nerve involvement and possible R medial elbow laxity, impaired strength, dizziness, impaired vision with ongoing  diplopia, impaired sensory integration, impaired balance and impaired gait placing patient at increased risk for falls. Pt would benefit from skilled PT to address these impairments and functional limitations to  maximize functional mobility independence and reduce falls risk.  Pt requested to only schedule three more visits to allow her to determine if she is motivated and willing to go through the full rehabilitation program.  Explained risks and benefits to patient and pt verbalized understanding.    Personal Factors and Comorbidities Behavior Pattern;Comorbidity 3+;Fitness;Past/Current Experience    Comorbidities anxiety, OA, back pain, chest pain, depression, HTN, hyperlipidemia, IBS, Afib, sleep apnea, multiple surgeries including R ankle fusion, shoulder replacement, bilat THA, back surgery, bilat TKA, and wrist surgery, obesity, bilat sensorineural hearing loss, tinnitus L ear, L ocular aneurysm with diplopia and multiple childhood ear infections, L foot osteotomy with plantar fascia release, R IT band release, bilat thumb replacement,    Examination-Activity Limitations Bend;Bed Mobility;Locomotion Level    Examination-Participation Restrictions Cleaning;Community Activity;Shop    Stability/Clinical Decision Making Evolving/Moderate complexity    Clinical Decision Making Moderate    Rehab Potential Fair    PT Frequency 2x / week    PT Duration 8 weeks    PT Treatment/Interventions ADLs/Self Care Home Management;Canalith Repostioning;Cryotherapy;Moist Heat;DME Instruction;Gait training;Stair training;Functional mobility training;Therapeutic activities;Therapeutic exercise;Balance training;Neuromuscular re-education;Patient/family education;Vestibular;Visual/perceptual remediation/compensation    PT Next Visit Plan How is elbow and knee.  Need more objective assessments - Strength, BERG, vestibular assessment, FGA.  Initiate HEP-vestibular, corner balance, brock string, walking program    Consulted  and Agree with Plan of Care Patient           Patient will benefit from skilled therapeutic intervention in order to improve the following deficits and impairments:  Decreased balance, Decreased strength, Difficulty walking, Dizziness, Impaired sensation, Impaired vision/preception, Obesity, Pain  Visit Diagnosis: Dizziness and giddiness  Repeated falls  Unsteadiness on feet  Muscle weakness (generalized)  Difficulty in walking, not elsewhere classified     Problem List Patient Active Problem List   Diagnosis Date Noted  . Reactive airway disease 11/22/2019  . GI bleed 10/14/2019  . Elevated serum creatinine 10/14/2019  . Chronic diarrhea 01/31/2018  . Irritable bowel syndrome with diarrhea 01/31/2018  . Bloating 01/31/2018  . Generalized abdominal pain 01/31/2018  . Colon cancer screening 03/24/2017  . Insulin resistance 02/15/2017  . Vitamin D deficiency 02/15/2017  . High serum vitamin B12 02/15/2017  . S/P shoulder replacement, right 05/14/2016  . Acute blood loss anemia 12/04/2014  . Obese 11/13/2014  . S/P bilateral THA, AA 11/12/2014  . Confusion 10/13/2014  . Acute confusional state 10/13/2014  . 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 Aftercare following joint replacement 03/28/2014  . History of artificial joint 03/28/2014  . Degenerative arthritis of hip 09/27/2013  . Paroxysmal atrial fibrillation ([place]) 09/07/2013  . Tachycardia 07/03/2013  . Snoring 07/03/2013  . Diverticulosis of colon without hemorrhage 06/26/2013  . RUQ abdominal pain 01/04/2013  . Nausea without vomiting 01/04/2013  . Hoarseness of voice 01/04/2013  . Diarrhea 01/04/2013  . Broken toe 03/09/2012  . Dyspnea 11/05/2011  . Upper airway cough syndrome 11/05/2011  . Asthma, persistent controlled 11/05/2011  . Morbid obesity ([place]) 11/05/2011  . Essential hypertension 09/09/2011  . Hyperlipemia 09/09/2011  . Bursitis, trochanteric 08/09/2011  . Ankle  arthropathy 05/31/2011    Rico Junker, PT, DPT 12/24/19    9:21 PM    [facility] [address] [place], [place], [address] Phone: [phone]   Fax:  [phone]  Name: [name] MRN: [id number] Date of Birth: 1945/10/29

## 2019-12-24 NOTE — Telephone Encounter (Signed)
Ok to give [employer] some zofran?  Thanks

## 2019-12-24 NOTE — Telephone Encounter (Signed)
Yes that's fine. Thanks  

## 2019-12-26 DIAGNOSIS — M25521 Pain in right elbow: Secondary | ICD-10-CM | POA: Diagnosis not present

## 2019-12-31 ENCOUNTER — Other Ambulatory Visit: Payer: Self-pay

## 2019-12-31 ENCOUNTER — Ambulatory Visit: Payer: Medicare Other | Admitting: Physical Therapy

## 2019-12-31 DIAGNOSIS — R296 Repeated falls: Secondary | ICD-10-CM

## 2019-12-31 DIAGNOSIS — R262 Difficulty in walking, not elsewhere classified: Secondary | ICD-10-CM

## 2019-12-31 DIAGNOSIS — R2681 Unsteadiness on feet: Secondary | ICD-10-CM

## 2019-12-31 DIAGNOSIS — M6281 Muscle weakness (generalized): Secondary | ICD-10-CM | POA: Diagnosis not present

## 2019-12-31 DIAGNOSIS — R42 Dizziness and giddiness: Secondary | ICD-10-CM

## 2019-12-31 NOTE — Therapy (Signed)
[facility] [address] [place], [place], [address] Phone: [phone]   Fax:  [phone]  Physical Therapy Treatment  Patient Details  Name: [name] MRN: [id number] Date of Birth: 1945-12-16 Referring Provider (PT): Leta Baptist, MD   Encounter Date: 12/31/2019   PT End of Session - 12/31/19 1432    Visit Number 2    Number of Visits 17    Date for PT Re-Evaluation 02/22/20    Authorization Type Medicare A&B/[employer]    Progress Note Due on Visit 10    PT Start Time 1104    PT Stop Time 1150    PT Time Calculation (min) 46 min    Activity Tolerance Patient limited by pain    Behavior During Therapy Anxious           Past Medical History:  Diagnosis Date  . Allergy   . Anemia   . Antral polyp    benign  . Anxiety   . Arthritis   . Asthma   . B12 deficiency   . Back pain   . Chest pain   . Constipation   . Depression   . Diverticulosis   . Gallbladder problem   . GERD (gastroesophageal reflux disease) 09/09/2011  . History of transfusion   . HTN (hypertension) 09/09/2011  . Hyperlipemia 09/09/2011  . Hypertension   . IBS (irritable bowel syndrome)   . Internal hemorrhoid   . Joint pain   . Obesity   . Osteoarthritis   . Paroxysmal atrial fibrillation (HCC)   . PONV (postoperative nausea and vomiting)    with big surgeries, none with endos etc, with colonoscopy- 5-6 years ago could not swallow   . Shortness of breath   . Sleep apnea    borderline per patient no cpap   . SOB (shortness of breath) on exertion   . Swallowing difficulty   . Tachycardia   . Unstable angina ([place]) 09/09/2011  . Vitamin D deficiency   . Vocal cord dysfunction 2013   swelling    Past Surgical History:  Procedure Laterality Date  . ABDOMINAL HYSTERECTOMY  1988  . ANKLE FUSION Right    x 2  . BACK SURGERY  [phone]   3 ruptured discs, lower back  . BILATERAL ANTERIOR TOTAL HIP ARTHROPLASTY Bilateral  11/12/2014   Procedure: BILATERAL ANTERIOR TOTAL HIP ARTHROPLASTY;  Surgeon: Paralee Cancel, MD;  Location: [facility];  Service: Orthopedics;  Laterality: Bilateral;  . BRAVO [place] STUDY [name] 03/06/2013   Procedure: BRAVO [place] STUDY;  Surgeon: Inda Castle, MD;  Location: [facility];  Service: Endoscopy;  Laterality: [name];  . CARDIAC CATHETERIZATION  2013   NORMAL  . CHOLECYSTECTOMY    . COLONOSCOPY    . ELBOW SURGERY Left    removed bone chip  . ESOPHAGOGASTRODUODENOSCOPY [name] 03/06/2013   Procedure: ESOPHAGOGASTRODUODENOSCOPY (EGD);  Surgeon: Inda Castle, MD;  Location: [facility];  Service: Endoscopy;  Laterality: [name];  . FOOT OSTEOTOMY W/ PLANTAR FASCIA RELEASE Left   . ILIOTIBIAL BAND RELEASE Right 1997  . JOINT REPLACEMENT    . LAPAROSCOPIC GASTRIC SLEEVE RESECTION WITH HIATAL HERNIA REPAIR  08/20/2014   Procedure: LAPAROSCOPIC GASTRIC SLEEVE RESECTION WITH HIATAL HERNIA  REPAIR AND  UPPER ENDOSCOPY;  Surgeon: Johnathan Hausen, MD;  Location: [facility];  Service: General;;  . LEFT HEART CATHETERIZATION WITH CORONARY ANGIOGRAM Bilateral 09/10/2011   Procedure: LEFT HEART CATHETERIZATION WITH CORONARY ANGIOGRAM;  Surgeon: Lorretta Harp, MD;  Location:  [facility];  Service: Cardiovascular;  Laterality: Bilateral;  . NASAL SINUS SURGERY  1997  . REVERSE SHOULDER ARTHROPLASTY Right 05/14/2016   Procedure: RIGHT REVERSE SHOULDER ARTHROPLASTY;  Surgeon: Netta Cedars, MD;  Location: [facility];  Service: Orthopedics;  Laterality: Right;  . SHOULDER SURGERY Left   . tfc wrist Left 1997  . thumb replacement Bilateral    one 2009 and one 2014  . TOTAL ANKLE REPLACEMENT Right    x 3  . TOTAL ANKLE REPLACEMENT Left   . TOTAL ANKLE REPLACEMENT    . TOTAL HIP ARTHROPLASTY Bilateral   . TOTAL KNEE ARTHROPLASTY Bilateral    left 2002, right 2012  . TOTAL SHOULDER ARTHROPLASTY Right 05/14/2016  . TRANSTHORACIC ECHOCARDIOGRAM  09/09/2011   MILD CONCENTRIC LVH. TRACE MITRAL REGURG. TRACE TR. MILD AORTIC  REGURG.  . 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 UPPER GI ENDOSCOPY  08/20/2014   Procedure: UPPER GI ENDOSCOPY;  Surgeon: Johnathan Hausen, MD;  Location: [facility];  Service: General;;  . WRIST SURGERY Left 1997, 2009     There were no vitals filed for this visit.   Subjective Assessment - 12/31/19 1111    Subjective Had another fall at home, turned around and lost her balance.  Was able to get herself up but feels like she strained her L thigh.  Went to see orthopedic physician about elbow and they did x-rays.  Wants to do another xray, suspects radial head fracture.  Husband would like pt to see a Physiological scientist.    Patient is accompained by: Family member    Limitations Standing;Walking    Diagnostic tests Vestibular function tests - central and peripheral dizziness    Patient Stated Goals Quit falling, husband's goal for her to be able to get up from the floor if she does fall              [facility] PT Assessment - 12/31/19 1113      Strength   Overall Strength Deficits    Overall Strength Comments 2+/5 hip flexion, knee extension 5/5 but painful on L, knee flexion 4-/5, ankle DF 5/5 L, 4/5 R due to fusion       Standardized Balance Assessment   Standardized Balance Assessment Berg Balance Test      Berg Balance Test   Sit to Stand Able to stand without using hands and stabilize independently    Standing Unsupported Able to stand safely 2 minutes    Sitting with Back Unsupported but Feet Supported on Floor or Stool Able to sit safely and securely 2 minutes    Stand to Sit Sits safely with minimal use of hands    Transfers Able to transfer safely, minor use of hands    Standing Unsupported with Eyes Closed Able to stand 10 seconds with supervision    Standing Unsupported with Feet Together Able to place feet together independently and stand for 1 minute with supervision    From Standing, Reach Forward with Outstretched Arm Can reach forward >12 cm safely (5")    From  Standing Position, Pick up Object from Floor Able to pick up shoe, needs supervision    From Standing Position, Turn to Look Behind Over each Shoulder Turn sideways only but maintains balance    Turn 360 Degrees Needs close supervision or verbal cueing    Standing Unsupported, Alternately Place Feet on Step/Stool Able to complete >2 steps/needs minimal assist    Standing Unsupported, One  Foot in [place] to take small step independently and hold 30 seconds    Standing on One Leg Unable to try or needs assist to prevent fall    Total Score 38    Berg comment: 38/56      Functional Gait  Assessment   Gait assessed  Yes    Gait Level Surface Walks 20 ft, slow speed, abnormal gait pattern, evidence for imbalance or deviates 10-15 in outside of the 12 in walkway width. Requires more than 7 sec to ambulate 20 ft.    Change in Gait Speed Makes only minor adjustments to walking speed, or accomplishes a change in speed with significant gait deviations, deviates 10-15 in outside the 12 in walkway width, or changes speed but loses balance but is able to recover and continue walking.    Gait with Horizontal Head Turns Performs head turns with moderate changes in gait velocity, slows down, deviates 10-15 in outside 12 in walkway width but recovers, can continue to walk.    Gait with Vertical Head Turns Performs task with severe disruption of gait (eg, staggers 15 in outside 12 in walkway width, loses balance, stops, reaches for wall).    Gait and Pivot Turn Turns slowly, requires verbal cueing, or requires several small steps to catch balance following turn and stop    Step Over Obstacle Cannot perform without assistance.    Gait with Narrow Base of Support Ambulates less than 4 steps heel to toe or cannot perform without assistance.    Gait with Eyes Closed Cannot walk 20 ft without assistance, severe gait deviations or imbalance, deviates greater than 15 in outside 12 in walkway width or will not attempt  task.    Ambulating Backwards Walks 20 ft, slow speed, abnormal gait pattern, evidence for imbalance, deviates 10-15 in outside 12 in walkway width.    Steps Two feet to a stair, must use rail.   must do step to due to ankle fusion   Total Score 6    FGA comment: 6/30                                 PT Education - 12/31/19 1431    Education Details results of objective assessments and compared scores to 2019.  Discussed goal of therapy and fear-avoidance cycle.  Discussed plan for next session.  Advised pt to not walk at [employer] track alone right now; will assess if pt would be safe to with AD without husband present.    Person(s) Educated Patient;Spouse    Methods Explanation    Comprehension Verbalized understanding            PT Short Term Goals - 12/24/19 2113      PT SHORT TERM GOAL #1   Title Pt will participate in MMT and further balance/falls risk assessment with BERG and FGA and will initiate HEP for LE strengthening and balance.    Time 4    Period Weeks    Status New    Target Date 01/23/20      PT SHORT TERM GOAL #2   Title Pt will participate in full vestibular/visual assessment and will initiate vestibular HEP and walking program    Time 4    Period Weeks    Status New    Target Date 01/23/20      PT SHORT TERM GOAL #3   Title Pt will demonstrate ability to rise  from floor with supervision and UE support on furniture.    Time 4    Period Weeks    Status New    Target Date 01/23/20             PT Long Term Goals - 12/31/19 1437      PT LONG TERM GOAL #1   Title Pt will demonstrate independence with walking program and final vestibular/balance HEP  (ALL LTG due 12/3)    Time 8    Period Weeks    Status New      PT LONG TERM GOAL #2   Title Pt will improve FGA by 4 points to indicate decreased falls risk in the community    Baseline 6/30    Time 8    Period Weeks    Status New      PT LONG TERM GOAL #3   Title Pt will  increase BERG by 8 points to indicate decreased falls risk.    Baseline 38    Time 8    Period Weeks    Status Revised      PT LONG TERM GOAL #4   Title Pt will demonstrate ability to reach down to the floor multiple times and turn/pivot to L and R without dizziness or LOB to improve safety with daily activities    Time 8    Period Weeks    Status New      PT LONG TERM GOAL #5   Title Pt will ambulate outside over paved surfaces x 250' MOD I with intermittent head turns to L and R without LOB    Time 8    Period Weeks    Status New      PT LONG TERM GOAL #6   Title Pt will improve function on FOTO to >67% and will decrease DHI to </= 22    Baseline 55%; 40    Time 8    Period Weeks    Status New                 Plan - 12/31/19 1433    Clinical Impression Statement Continued to assess patient's impairments, functional limitations and falls risk.  Based on BERG and FGA pt is at high risk for falls; pt continues to experience falls when walking and turning and is extremely anxious to ambulate without UE hand held support.  Discussed importance of addressing with therapy, HEP and exercise plan at D/C.  Pt is interested in walking at church track but husband doesn't feel he would be able to accompany her each time.  Husband would like pt to try working with a Physiological scientist.  Will continue to assess and discuss various options including aquatic.  Will initiate HEP next session.    Personal Factors and Comorbidities Behavior Pattern;Comorbidity 3+;Fitness;Past/Current Experience    Comorbidities anxiety, OA, back pain, chest pain, depression, HTN, hyperlipidemia, IBS, Afib, sleep apnea, multiple surgeries including R ankle fusion, shoulder replacement, bilat THA, back surgery, bilat TKA, and wrist surgery, obesity, bilat sensorineural hearing loss, tinnitus L ear, L ocular aneurysm with diplopia and multiple childhood ear infections, L foot osteotomy with plantar fascia release, R IT  band release, bilat thumb replacement,    Examination-Activity Limitations Bend;Bed Mobility;Locomotion Level    Examination-Participation Restrictions Cleaning;Community Activity;Shop    Stability/Clinical Decision Making Evolving/Moderate complexity    Rehab Potential Fair    PT Frequency 2x / week    PT Duration 8 weeks    PT  Treatment/Interventions ADLs/Self Care Home Management;Canalith Repostioning;Cryotherapy;Moist Heat;DME Instruction;Gait training;Stair training;Functional mobility training;Therapeutic activities;Therapeutic exercise;Balance training;Neuromuscular re-education;Patient/family education;Vestibular;Visual/perceptual remediation/compensation    PT Next Visit Plan Did she get xray of elbow?  Vestibular assessment?  Gait with cane around church track? Initiate HEP-vestibular, turns, corner balance, brock string, walking program.  Aquatic, personal trainer?    Consulted and Agree with Plan of Care Patient;Family member/caregiver    Family Member Consulted husband           Patient will benefit from skilled therapeutic intervention in order to improve the following deficits and impairments:  Decreased balance, Decreased strength, Difficulty walking, Dizziness, Impaired sensation, Impaired vision/preception, Obesity, Pain  Visit Diagnosis: Dizziness and giddiness  Repeated falls  Unsteadiness on feet  Muscle weakness (generalized)  Difficulty in walking, not elsewhere classified     Problem List Patient Active Problem List   Diagnosis Date Noted  . Reactive airway disease 11/22/2019  . GI bleed 10/14/2019  . Elevated serum creatinine 10/14/2019  . Chronic diarrhea 01/31/2018  . Irritable bowel syndrome with diarrhea 01/31/2018  . Bloating 01/31/2018  . Generalized abdominal pain 01/31/2018  . Colon cancer screening 03/24/2017  . Insulin resistance 02/15/2017  . Vitamin D deficiency 02/15/2017  . High serum vitamin B12 02/15/2017  . S/P shoulder  replacement, right 05/14/2016  . Acute blood loss anemia 12/04/2014  . Obese 11/13/2014  . S/P bilateral THA, AA 11/12/2014  . Confusion 10/13/2014  . Acute confusional state 10/13/2014  . 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 Aftercare following joint replacement 03/28/2014  . History of artificial joint 03/28/2014  . Degenerative arthritis of hip 09/27/2013  . Paroxysmal atrial fibrillation ([place]) 09/07/2013  . Tachycardia 07/03/2013  . Snoring 07/03/2013  . Diverticulosis of colon without hemorrhage 06/26/2013  . RUQ abdominal pain 01/04/2013  . Nausea without vomiting 01/04/2013  . Hoarseness of voice 01/04/2013  . Diarrhea 01/04/2013  . Broken toe 03/09/2012  . Dyspnea 11/05/2011  . Upper airway cough syndrome 11/05/2011  . Asthma, persistent controlled 11/05/2011  . Morbid obesity ([place]) 11/05/2011  . Essential hypertension 09/09/2011  . Hyperlipemia 09/09/2011  . Bursitis, trochanteric 08/09/2011  . Ankle arthropathy 05/31/2011    Rico Junker, PT, DPT 12/31/19    2:40 PM    [facility] [address] [place], [place], [address] Phone: [phone]   Fax:  [phone]  Name: [name] MRN: [id number] Date of Birth: Jul 29, 1945

## 2020-01-09 ENCOUNTER — Ambulatory Visit (HOSPITAL_COMMUNITY): Payer: Medicare Other | Admitting: Nurse Practitioner

## 2020-01-09 ENCOUNTER — Ambulatory Visit (HOSPITAL_COMMUNITY)
Admission: RE | Admit: 2020-01-09 | Discharge: 2020-01-09 | Disposition: A | Discharge status: Home / Self Care | Payer: Medicare Other | Source: Ambulatory Visit | Attending: Nurse Practitioner | Admitting: Nurse Practitioner

## 2020-01-09 ENCOUNTER — Other Ambulatory Visit: Payer: Self-pay

## 2020-01-09 VITALS — BP 125/82 | HR 94 | Ht 64.0 in | Wt 241.2 lb

## 2020-01-09 DIAGNOSIS — F419 Anxiety disorder, unspecified: Secondary | ICD-10-CM | POA: Diagnosis not present

## 2020-01-09 DIAGNOSIS — I480 Paroxysmal atrial fibrillation: Secondary | ICD-10-CM | POA: Diagnosis not present

## 2020-01-09 DIAGNOSIS — I4891 Unspecified atrial fibrillation: Secondary | ICD-10-CM | POA: Diagnosis not present

## 2020-01-09 DIAGNOSIS — Z9884 Bariatric surgery status: Secondary | ICD-10-CM | POA: Diagnosis not present

## 2020-01-09 DIAGNOSIS — I10 Essential (primary) hypertension: Secondary | ICD-10-CM | POA: Diagnosis not present

## 2020-01-09 DIAGNOSIS — K589 Irritable bowel syndrome without diarrhea: Secondary | ICD-10-CM | POA: Insufficient documentation

## 2020-01-09 DIAGNOSIS — E538 Deficiency of other specified B group vitamins: Secondary | ICD-10-CM | POA: Diagnosis not present

## 2020-01-09 DIAGNOSIS — Z79899 Other long term (current) drug therapy: Secondary | ICD-10-CM | POA: Insufficient documentation

## 2020-01-09 DIAGNOSIS — E669 Obesity, unspecified: Secondary | ICD-10-CM | POA: Diagnosis not present

## 2020-01-09 DIAGNOSIS — D6869 Other thrombophilia: Secondary | ICD-10-CM

## 2020-01-09 DIAGNOSIS — K219 Gastro-esophageal reflux disease without esophagitis: Secondary | ICD-10-CM | POA: Diagnosis not present

## 2020-01-10 ENCOUNTER — Encounter (HOSPITAL_COMMUNITY): Payer: Self-pay | Admitting: Nurse Practitioner

## 2020-01-10 ENCOUNTER — Other Ambulatory Visit (HOSPITAL_COMMUNITY): Payer: Self-pay | Admitting: *Deleted

## 2020-01-10 DIAGNOSIS — I480 Paroxysmal atrial fibrillation: Secondary | ICD-10-CM

## 2020-01-10 NOTE — Progress Notes (Signed)
Primary Care Physician: Theresa Redwood, MD Referring Physician: Kerin Ransom, PA Cardiologist: [name] is a 74 y.o. female with a h/o HTN, obesity, s/p gastric sleeve in 2016, recent GI Bleed's with h/o diverticulitis and hemorrhoids). esophageal strictures, remote monitor in 2015 showing brief runs of afib at which time she was placed on eliquis. SHe was taken off  eliquis after a 90 lb weight loss after her surgery in  2017 and her 30 day event monitor did not show any further afib.  She then had an endoscopy in February and monitor  strips were thought to represent afib and she  was started back on Eliquis by Dr. [name] .(strips reviewed with myself and Dr. [name], thought to represent SR with PAC's). Since she has been back on eliquis she has had rectal bleeding every month making her have to hold her eliquis for 4 days to control the bleeding. A 7 day monitor was placed on recheck with Theresa Ransom, PA and did not show any afib.   She is now in the afib clinic to discuss the next steps with  ongoing rectal bleeding on eliquis and no  recent  documented afib.   Today, she denies symptoms of palpitations, chest pain, shortness of breath, orthopnea, PND, lower extremity edema, dizziness, presyncope, syncope, or neurologic sequela. The patient is tolerating medications without difficulties and is otherwise without complaint today.   Past Medical History:  Diagnosis Date  . Allergy   . Anemia   . Antral polyp    benign  . Anxiety   . Arthritis   . Asthma   . B12 deficiency   . Back pain   . Chest pain   . Constipation   . Depression   . Diverticulosis   . Gallbladder problem   . GERD (gastroesophageal reflux disease) 09/09/2011  . History of transfusion   . HTN (hypertension) 09/09/2011  . Hyperlipemia 09/09/2011  . Hypertension   . IBS (irritable bowel syndrome)   . Internal hemorrhoid   . Joint pain   . Obesity   . Osteoarthritis   . Paroxysmal atrial  fibrillation (H[facility])   . PONV (postoperative nausea and vomiting)    with big surgeries, none with endos etc, with colonoscopy- 5-6 years ago could not swallow   . Shortness of breath   . Sleep apnea    borderline per patient no cpap   . SOB (shortness of breath) on exertion   . Swallowing difficulty   . Tachycardia   . Unstable angina ([place]) 09/09/2011  . Vitamin D deficiency   . Vocal cord dysfunction 2013   swelling   Past Surgical History:  Procedure Laterality Date  . ABDOMINAL HYSTERECTOMY  1988  . ANKLE FUSION Right    x 2  . BACK SURGERY  [phone]   3 ruptured discs, lower back  . BILATERAL ANTERIOR TOTAL HIP ARTHROPLASTY Bilateral 11/12/2014   Procedure: BILATERAL ANTERIOR TOTAL HIP ARTHROPLASTY;  Surgeon: Paralee Cancel, MD;  Location: [facility];  Service: Orthopedics;  Laterality: Bilateral;  . BRAVO [place] STUDY [name] 03/06/2013   Procedure: BRAVO [place] STUDY;  Surgeon: Inda Castle, MD;  Location: [facility];  Service: Endoscopy;  Laterality: [name];  . CARDIAC CATHETERIZATION  2013   NORMAL  . CHOLECYSTECTOMY    . COLONOSCOPY    . ELBOW SURGERY Left    removed bone chip  . ESOPHAGOGASTRODUODENOSCOPY [name] 03/06/2013   Procedure: ESOPHAGOGASTRODUODENOSCOPY (EGD);  Surgeon: Inda Castle,  MD;  Location: [facility];  Service: Endoscopy;  Laterality: [name];  . FOOT OSTEOTOMY W/ PLANTAR FASCIA RELEASE Left   . ILIOTIBIAL BAND RELEASE Right 1997  . JOINT REPLACEMENT    . LAPAROSCOPIC GASTRIC SLEEVE RESECTION WITH HIATAL HERNIA REPAIR  08/20/2014   Procedure: LAPAROSCOPIC GASTRIC SLEEVE RESECTION WITH HIATAL HERNIA  REPAIR AND  UPPER ENDOSCOPY;  Surgeon: Johnathan Hausen, MD;  Location: [facility];  Service: General;;  . LEFT HEART CATHETERIZATION WITH CORONARY ANGIOGRAM Bilateral 09/10/2011   Procedure: LEFT HEART CATHETERIZATION WITH CORONARY ANGIOGRAM;  Surgeon: Lorretta Harp, MD;  Location: [facility] CATH LAB;  Service: Cardiovascular;  Laterality: Bilateral;  . NASAL SINUS SURGERY  1997    . REVERSE SHOULDER ARTHROPLASTY Right 05/14/2016   Procedure: RIGHT REVERSE SHOULDER ARTHROPLASTY;  Surgeon: Netta Cedars, MD;  Location: [facility];  Service: Orthopedics;  Laterality: Right;  . SHOULDER SURGERY Left   . tfc wrist Left 1997  . thumb replacement Bilateral    one 2009 and one 2014  . TOTAL ANKLE REPLACEMENT Right    x 3  . TOTAL ANKLE REPLACEMENT Left   . TOTAL ANKLE REPLACEMENT    . TOTAL HIP ARTHROPLASTY Bilateral   . TOTAL KNEE ARTHROPLASTY Bilateral    left 2002, right 2012  . TOTAL SHOULDER ARTHROPLASTY Right 05/14/2016  . TRANSTHORACIC ECHOCARDIOGRAM  09/09/2011   MILD CONCENTRIC LVH. TRACE MITRAL REGURG. TRACE TR. MILD AORTIC REGURG.  . 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 UPPER GI ENDOSCOPY  08/20/2014   Procedure: UPPER GI ENDOSCOPY;  Surgeon: Johnathan Hausen, MD;  Location: [facility];  Service: General;;  . WRIST SURGERY Left 1997, 2009     Current Outpatient Medications  Medication Sig Dispense Refill  . acetaminophen (TYLENOL) 500 MG tablet Take 1,000 mg by mouth every 6 (six) hours as needed (for pain.).     [name] albuterol (PROVENTIL HFA;VENTOLIN HFA) 108 (90 Base) MCG/ACT inhaler Inhale 1-2 puffs into the lungs every 6 (six) hours as needed for shortness of breath or wheezing.    [name] ALPRAZolam (XANAX) 0.25 MG tablet Take 0.125 mg by mouth as needed for anxiety.    [name] apixaban (ELIQUIS) 5 MG TABS tablet Take 1 tablet (5 mg total) by mouth 2 (two) times daily. 60 tablet 5  . cetirizine (ZYRTEC) 10 MG tablet Take 10 mg by mouth as needed for allergies.    . cholecalciferol (VITAMIN D) 1000 units tablet Take 1 tablet (1,000 Units total) by mouth 2 (two) times daily. 60 tablet 0  . colestipol (COLESTID) 1 g tablet Take 1 g by mouth every other day.    . dicyclomine (BENTYL) 10 MG capsule Take 10 mg by mouth in the morning and at bedtime.    . DULoxetine (CYMBALTA) 60 MG capsule Take 1 capsule by mouth daily.    [name] esomeprazole (NEXIUM) 40 MG capsule TAKE 1 CAPSULE  BY MOUTH TWICE DAILY BEFORE A MEAL 60 capsule 5  . losartan (COZAAR) 50 MG tablet Take 1 tablet by mouth daily.    . Melatonin 1 MG CAPS Take 1 capsule by mouth at bedtime as needed.    . Multiple Vitamin (MULTIVITAMIN WITH MINERALS) TABS tablet Take 1 tablet by mouth daily. Centrum     . ondansetron (ZOFRAN-ODT) 8 MG disintegrating tablet DISSOLVE 1 TABLET IN MOUTH EVERY 8 HOURS AS NEEDED FOR NAUSEA OR VOMITING. TAKE AT LEAST ONCE A DAY. 90 tablet 0  . simvastatin (ZOCOR) 20 MG tablet Take 1  tablet (20 mg total) by mouth daily. 30 tablet 11  . vitamin B-12 (CYANOCOBALAMIN) 1000 MCG tablet Take 1,000 mcg by mouth daily.     No current facility-administered medications for this encounter.    Allergies  Allergen Reactions  . Ativan [Lorazepam] Other (See Comments)    Agitation, aggressive actions, significant mental changes  . Cefdinir Hives    Hives, headache  . Buprenorphine Hcl Itching and Rash  . Fentanyl Itching  . Morphine And Related Itching and Rash    Social History   Socioeconomic History  . Marital status: Married    Spouse name: [name]  . Number of children: 2  . Years of education: Not on file  . Highest education level: Not on file  Occupational History  . Occupation: retired    Fish farm manager: RETIRED  Tobacco Use  . Smoking status: Never Smoker  . Smokeless tobacco: Never Used  Vaping Use  . Vaping Use: Never used  Substance and Sexual Activity  . Alcohol use: No  . Drug use: No  . Sexual activity: Yes    Partners: Male  Other Topics Concern  . Not on file  Social History Narrative  . Not on file   Social Determinants of Health   Financial Resource Strain:   . Difficulty of Paying Living Expenses: Not on file  Food Insecurity:   . Worried About Charity fundraiser in the Last Year: Not on file  . Ran Out of Food in the Last Year: Not on file  Transportation Needs:   . Lack of Transportation (Medical): Not on file  . Lack of Transportation (Non-Medical):  Not on file  Physical Activity:   . Days of Exercise per Week: Not on file  . Minutes of Exercise per Session: Not on file  Stress:   . Feeling of Stress : Not on file  Social Connections:   . Frequency of Communication with Friends and Family: Not on file  . Frequency of Social Gatherings with Friends and Family: Not on file  . Attends Religious Services: Not on file  . Active Member of Clubs or Organizations: Not on file  . Attends Archivist Meetings: Not on file  . Marital Status: Not on file  Intimate Partner Violence:   . Fear of Current or Ex-Partner: Not on file  . Emotionally Abused: Not on file  . Physically Abused: Not on file  . Sexually Abused: Not on file    Family History  Problem Relation Age of Onset  . Hypertension Mother   . Diabetes Mother   . Arthritis Mother   . Esophageal cancer Mother   . Cancer Mother   . Esophageal cancer Father 53  . Cancer Father   . Alcoholism Father   . Other Sister        kidney mass removed  . Hypertension Sister   . Stroke Sister        died in 12/21/2018  . Heart disease Maternal Grandmother   . Stroke Paternal Grandmother   . Coronary artery disease Neg Hx   . Colon cancer Neg Hx   . Liver disease Neg Hx   . Kidney disease Neg Hx   . Rectal cancer Neg Hx   . Stomach cancer Neg Hx     ROS- All systems are reviewed and negative except as per the HPI above  Physical Exam: Vitals:   01/09/20 1102  BP: 125/82  Pulse: 94  Weight: 109.4 kg  Height:  5\' 4"  (1.626 m)   Wt Readings from Last 3 Encounters:  01/09/20 109.4 kg  11/22/19 108 kg  10/18/19 (!) 107 kg    Labs: Lab Results  Component Value Date   NA 135 10/15/2019   K 4.0 10/15/2019   CL 104 10/15/2019   CO2 22 10/15/2019   GLUCOSE 95 10/15/2019   BUN 15 10/15/2019   CREATININE 0.74 10/15/2019   CALCIUM 8.1 (L) 10/15/2019   MG 1.9 10/13/2014   Lab Results  Component Value Date   INR 1.1 10/14/2019   Lab Results  Component  Value Date   CHOL 190 11/17/2017   HDL 95 11/17/2017   LDLCALC 83 11/17/2017   TRIG 60 11/17/2017     GEN- The patient is well appearing, alert and oriented x 3 today.   Head- normocephalic, atraumatic Eyes-  Sclera clear, conjunctiva pink Ears- hearing intact Oropharynx- clear Neck- supple, no JVP Lymph- no cervical lymphadenopathy Lungs- Clear to ausculation bilaterally, normal work of breathing Heart- Regular rate and rhythm, no murmurs, rubs or gallops, PMI not laterally displaced GI- soft, NT, ND, + BS Extremities- no clubbing, cyanosis, or edema MS- no significant deformity or atrophy Skin- no rash or lesion Psych- euthymic mood, full affect Neuro- strength and sensation are intact  EKG-NSR at 94 bpm, PR int 144 ms, qrs int 94 ms, qtc 465 ms  2015 and 2017 monitor reviewed as well as strip from endoscopy in February 2021(listed under EKG)  recent ZIO  patch (later 3 did not show any afib)   Paroxysmal atrial fibrillation- Monitor 2015  Per Dr. [name] in 2015- The monitor showed sinus rhythm with episodes of PAF.Her CHA2DS2VASC score is 3. I'm going to arrange for her to start a novel oral anticoagulant as well as Cardizem CD 180 mg a day. She does have normal LV function and normal coronary arteries and therefore may be a candidate for antiarrhythmic medication. I am referring her to Dr. Thompson Green for further evaluation.  Assessment and Plan: 1. Afib  Very remote documented afib in 2015 Eliquis restarted in February of this year after endoscopy Strips thought to represent afib , appear more likely SR with PAC's  Pt is having  frequent rectal bleeding that is occurring  about every month, making her hold  DOAC for 4 days to relieve the rectal bleeding  I discussed with Dr. [name] He does not feel that  she is a Watchman candidate at this point for lack of afib burden  He feels that a Linq should be implanted and then will stop DOAC Then will monitor for any afib and if  seen  will make decision going forward re continuing blood thinner or may be a watchman candidate at that  time  Pt is agreeable to this plan   2. CHA2DS2VASc score of least 3 Continue eliquis 5 mg bid for now  Plan is to stop once Linq is implanted   She will be notified of appointment with Dr. [name] in the next 1-2 weeks   [name], [facility] [address] [place], [place] [address] [phone]

## 2020-01-11 ENCOUNTER — Encounter: Payer: Self-pay | Admitting: Physical Therapy

## 2020-01-11 ENCOUNTER — Ambulatory Visit: Payer: Medicare Other | Admitting: Physical Therapy

## 2020-01-11 ENCOUNTER — Other Ambulatory Visit: Payer: Self-pay

## 2020-01-11 DIAGNOSIS — R2681 Unsteadiness on feet: Secondary | ICD-10-CM

## 2020-01-11 DIAGNOSIS — R296 Repeated falls: Secondary | ICD-10-CM | POA: Diagnosis not present

## 2020-01-11 DIAGNOSIS — M6281 Muscle weakness (generalized): Secondary | ICD-10-CM

## 2020-01-11 DIAGNOSIS — R262 Difficulty in walking, not elsewhere classified: Secondary | ICD-10-CM | POA: Diagnosis not present

## 2020-01-11 DIAGNOSIS — R42 Dizziness and giddiness: Secondary | ICD-10-CM | POA: Diagnosis not present

## 2020-01-11 NOTE — Therapy (Signed)
[facility] [address] [place], [place], [address] Phone: [phone]   Fax:  [phone]  Physical Therapy Treatment  Patient Details  Name: [name] MRN: [id number] Date of Birth: 1945/12/17 Referring Provider (PT): Leta Baptist, MD   Encounter Date: 01/11/2020   PT End of Session - 01/12/20 1242    Visit Number 3    Number of Visits 17    Date for PT Re-Evaluation 02/22/20    Authorization Type Medicare A&B/[employer]    Progress Note Due on Visit 10    PT Start Time 1150    PT Stop Time 1230    PT Time Calculation (min) 40 min    Activity Tolerance Patient tolerated treatment well    Behavior During Therapy [facility] for tasks assessed/performed           Past Medical History:  Diagnosis Date  . Allergy   . Anemia   . Antral polyp    benign  . Anxiety   . Arthritis   . Asthma   . B12 deficiency   . Back pain   . Chest pain   . Constipation   . Depression   . Diverticulosis   . Gallbladder problem   . GERD (gastroesophageal reflux disease) 09/09/2011  . History of transfusion   . HTN (hypertension) 09/09/2011  . Hyperlipemia 09/09/2011  . Hypertension   . IBS (irritable bowel syndrome)   . Internal hemorrhoid   . Joint pain   . Obesity   . Osteoarthritis   . Paroxysmal atrial fibrillation (HCC)   . PONV (postoperative nausea and vomiting)    with big surgeries, none with endos etc, with colonoscopy- 5-6 years ago could not swallow   . Shortness of breath   . Sleep apnea    borderline per patient no cpap   . SOB (shortness of breath) on exertion   . Swallowing difficulty   . Tachycardia   . Unstable angina ([place]) 09/09/2011  . Vitamin D deficiency   . Vocal cord dysfunction 2013   swelling    Past Surgical History:  Procedure Laterality Date  . ABDOMINAL HYSTERECTOMY  1988  . ANKLE FUSION Right    x 2  . BACK SURGERY  [phone]   3 ruptured discs, lower back  . BILATERAL ANTERIOR  TOTAL HIP ARTHROPLASTY Bilateral 11/12/2014   Procedure: BILATERAL ANTERIOR TOTAL HIP ARTHROPLASTY;  Surgeon: Paralee Cancel, MD;  Location: [facility];  Service: Orthopedics;  Laterality: Bilateral;  . BRAVO [place] STUDY [name] 03/06/2013   Procedure: BRAVO [place] STUDY;  Surgeon: Inda Castle, MD;  Location: [facility];  Service: Endoscopy;  Laterality: [name];  . CARDIAC CATHETERIZATION  2013   NORMAL  . CHOLECYSTECTOMY    . COLONOSCOPY    . ELBOW SURGERY Left    removed bone chip  . ESOPHAGOGASTRODUODENOSCOPY [name] 03/06/2013   Procedure: ESOPHAGOGASTRODUODENOSCOPY (EGD);  Surgeon: Inda Castle, MD;  Location: [facility];  Service: Endoscopy;  Laterality: [name];  . FOOT OSTEOTOMY W/ PLANTAR FASCIA RELEASE Left   . ILIOTIBIAL BAND RELEASE Right 1997  . JOINT REPLACEMENT    . LAPAROSCOPIC GASTRIC SLEEVE RESECTION WITH HIATAL HERNIA REPAIR  08/20/2014   Procedure: LAPAROSCOPIC GASTRIC SLEEVE RESECTION WITH HIATAL HERNIA  REPAIR AND  UPPER ENDOSCOPY;  Surgeon: Johnathan Hausen, MD;  Location: [facility];  Service: General;;  . LEFT HEART CATHETERIZATION WITH CORONARY ANGIOGRAM Bilateral 09/10/2011   Procedure: LEFT HEART CATHETERIZATION WITH CORONARY ANGIOGRAM;  Surgeon: Lorretta Harp,  MD;  Location: [facility];  Service: Cardiovascular;  Laterality: Bilateral;  . NASAL SINUS SURGERY  1997  . REVERSE SHOULDER ARTHROPLASTY Right 05/14/2016   Procedure: RIGHT REVERSE SHOULDER ARTHROPLASTY;  Surgeon: Netta Cedars, MD;  Location: [facility];  Service: Orthopedics;  Laterality: Right;  . SHOULDER SURGERY Left   . tfc wrist Left 1997  . thumb replacement Bilateral    one 2009 and one 2014  . TOTAL ANKLE REPLACEMENT Right    x 3  . TOTAL ANKLE REPLACEMENT Left   . TOTAL ANKLE REPLACEMENT    . TOTAL HIP ARTHROPLASTY Bilateral   . TOTAL KNEE ARTHROPLASTY Bilateral    left 2002, right 2012  . TOTAL SHOULDER ARTHROPLASTY Right 05/14/2016  . TRANSTHORACIC ECHOCARDIOGRAM  09/09/2011   MILD CONCENTRIC LVH. TRACE MITRAL  REGURG. TRACE TR. MILD AORTIC REGURG.  . 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 UPPER GI ENDOSCOPY  08/20/2014   Procedure: UPPER GI ENDOSCOPY;  Surgeon: Johnathan Hausen, MD;  Location: [facility];  Service: General;;  . WRIST SURGERY Left 1997, 2009     There were no vitals filed for this visit.   Subjective Assessment - 01/11/20 1153    Subjective No further falls, elbow and knee are feeling better.  Cancelled second xray.  One stumble but caught self on the doorway.  Had an appointment with physician regarding heart rhythm.  They are looking into inserting a Loop recorder for more information.    Patient is accompained by: Family member    Limitations Standing;Walking    Diagnostic tests Vestibular function tests - central and peripheral dizziness    Patient Stated Goals Quit falling, husband's goal for her to be able to get up from the floor if she does fall    Currently in Pain? Yes    Pain Location Shoulder    Pain Orientation Right    Pain Descriptors / Indicators Sore                             [facility] Adult PT Treatment/Exercise - 01/11/20 1254      Ambulation/Gait   Ambulation/Gait Yes    Ambulation/Gait Assistance 4: Min guard    Ambulation/Gait Assistance Details assessed gait with cane with quad tip around gym track x 2 laps with R turns and 2 laps with L turns to assess pt safety with gait around church track.  Pt reported greater instability with R turns but reports church track has a rail around it on the wall.  No LOB or stumbling; cued pt to look 6 feet in front of her to begin to shift gaze upwards but maintain stability.  Pt reports she feels safe with cane with quad tip and will purchase quad tip to put on SPC at home.  Pt held cane in LUE; provided cues for safe sequence with cane - 2 point gait pattern    Ambulation Distance (Feet) 460 Feet    Assistive device Straight cane    Gait Pattern Step-through pattern;Decreased arm swing -  right;Decreased stride length;Decreased trunk rotation;Wide base of support    Ambulation Surface Level;Indoor      Exercises   Exercises Other Exercises    Other Exercises  Initiated standing balance HEP focusing on ankle and hip strengthening, SLS stability and stability with rotation.  Pt return each of the exercises prescribed below and was able to perform all safely with UE support  Access Code: [email or web address] URL: [email or web address] Date: 01/11/2020 Prepared by: Misty Stanley  Exercises Standing Hip Abduction with Counter Support - 1 x daily - 7 x weekly - 2 sets - 10 reps Heel Toe Raises with Counter Support - 1 x daily - 7 x weekly - 2 sets - 10 reps Narrow Stance with Counter Support - 1 x daily - 7 x weekly - 3 sets - 10 seconds hold Standing Quarter Turn with Counter Support - 1 x daily - 7 x weekly - 2 sets - 10 reps Standing Single Leg Stance with Counter Support - 1 x daily - 7 x weekly - 2 sets - 10 second hold       PT Education - 01/12/20 1242    Education Details initiated LE strength and balance HEP; discussed safety while walking track at church and how to purchase a rubber quad tip for cane    Person(s) Educated Patient;Spouse    Methods Explanation;Demonstration;Handout    Comprehension Verbalized understanding;Returned demonstration            PT Short Term Goals - 12/24/19 2113      PT SHORT TERM GOAL #1   Title Pt will participate in MMT and further balance/falls risk assessment with BERG and FGA and will initiate HEP for LE strengthening and balance.    Time 4    Period Weeks    Status New    Target Date 01/23/20      PT SHORT TERM GOAL #2   Title Pt will participate in full vestibular/visual assessment and will initiate vestibular HEP and walking program    Time 4    Period Weeks    Status New    Target Date 01/23/20      PT SHORT TERM GOAL #3   Title Pt will demonstrate ability to rise from floor with supervision  and UE support on furniture.    Time 4    Period Weeks    Status New    Target Date 01/23/20             PT Long Term Goals - 12/31/19 1437      PT LONG TERM GOAL #1   Title Pt will demonstrate independence with walking program and final vestibular/balance HEP  (ALL LTG due 12/3)    Time 8    Period Weeks    Status New      PT LONG TERM GOAL #2   Title Pt will improve FGA by 4 points to indicate decreased falls risk in the community    Baseline 6/30    Time 8    Period Weeks    Status New      PT LONG TERM GOAL #3   Title Pt will increase BERG by 8 points to indicate decreased falls risk.    Baseline 38    Time 8    Period Weeks    Status Revised      PT LONG TERM GOAL #4   Title Pt will demonstrate ability to reach down to the floor multiple times and turn/pivot to L and R without dizziness or LOB to improve safety with daily activities    Time 8    Period Weeks    Status New      PT LONG TERM GOAL #5   Title Pt will ambulate outside over paved surfaces x 250' MOD I with intermittent head turns to L and R without LOB    Time 8  Period Weeks    Status New      PT LONG TERM GOAL #6   Title Pt will improve function on FOTO to >67% and will decrease DHI to </= 22    Baseline 55%; 40    Time 8    Period Weeks    Status New                 Plan - 01/12/20 1243    Clinical Impression Statement Pt reports no falls since last session and reports improvement in elbow and knee pain.  Treatment session focused on initiating appropriate and safe walking program for patient as well as initiation of LE strengthening and balance HEP.  Pt contiues to demonstrate greatest instability with head and body turns.  Will continue to address and progress towards LTG.    Personal Factors and Comorbidities Behavior Pattern;Comorbidity 3+;Fitness;Past/Current Experience    Comorbidities anxiety, OA, back pain, chest pain, depression, HTN, hyperlipidemia, IBS, Afib, sleep apnea,  multiple surgeries including R ankle fusion, shoulder replacement, bilat THA, back surgery, bilat TKA, and wrist surgery, obesity, bilat sensorineural hearing loss, tinnitus L ear, L ocular aneurysm with diplopia and multiple childhood ear infections, L foot osteotomy with plantar fascia release, R IT band release, bilat thumb replacement,    Examination-Activity Limitations Bend;Bed Mobility;Locomotion Level    Examination-Participation Restrictions Cleaning;Community Activity;Shop    Stability/Clinical Decision Making Evolving/Moderate complexity    Rehab Potential Fair    PT Frequency 2x / week    PT Duration 8 weeks    PT Treatment/Interventions ADLs/Self Care Home Management;Canalith Repostioning;Cryotherapy;Moist Heat;DME Instruction;Gait training;Stair training;Functional mobility training;Therapeutic activities;Therapeutic exercise;Balance training;Neuromuscular re-education;Patient/family education;Vestibular;Visual/perceptual remediation/compensation    PT Next Visit Plan Did she walk at track?  How are exercises going?   Continue balance and strength training focusing on balance with turns, balance with head turns/balance reactions.  Transfers off of the floor.  Aquatic, personal trainer?    Consulted and Agree with Plan of Care Patient;Family member/caregiver    Family Member Consulted husband           Patient will benefit from skilled therapeutic intervention in order to improve the following deficits and impairments:  Decreased balance, Decreased strength, Difficulty walking, Dizziness, Impaired sensation, Impaired vision/preception, Obesity, Pain  Visit Diagnosis: Repeated falls  Unsteadiness on feet  Muscle weakness (generalized)  Difficulty in walking, not elsewhere classified     Problem List Patient Active Problem List   Diagnosis Date Noted  . Reactive airway disease 11/22/2019  . GI bleed 10/14/2019  . Elevated serum creatinine 10/14/2019  . Chronic diarrhea  01/31/2018  . Irritable bowel syndrome with diarrhea 01/31/2018  . Bloating 01/31/2018  . Generalized abdominal pain 01/31/2018  . Colon cancer screening 03/24/2017  . Insulin resistance 02/15/2017  . Vitamin D deficiency 02/15/2017  . High serum vitamin B12 02/15/2017  . S/P shoulder replacement, right 05/14/2016  . Acute blood loss anemia 12/04/2014  . Obese 11/13/2014  . S/P bilateral THA, AA 11/12/2014  . Confusion 10/13/2014  . Acute confusional state 10/13/2014  . 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 Aftercare following joint replacement 03/28/2014  . History of artificial joint 03/28/2014  . Degenerative arthritis of hip 09/27/2013  . Paroxysmal atrial fibrillation ([place]) 09/07/2013  . Tachycardia 07/03/2013  . Snoring 07/03/2013  . Diverticulosis of colon without hemorrhage 06/26/2013  . RUQ abdominal pain 01/04/2013  . Nausea without vomiting 01/04/2013  . Hoarseness of voice 01/04/2013  . Diarrhea 01/04/2013  .  Broken toe 03/09/2012  . Dyspnea 11/05/2011  . Upper airway cough syndrome 11/05/2011  . Asthma, persistent controlled 11/05/2011  . Morbid obesity ([place]) 11/05/2011  . Essential hypertension 09/09/2011  . Hyperlipemia 09/09/2011  . Bursitis, trochanteric 08/09/2011  . Ankle arthropathy 05/31/2011    Rico Junker, PT, DPT 01/12/20    12:49 PM    [facility] [address] [place], [place], [address] Phone: [phone]   Fax:  [phone]  Name: [name] MRN: [id number] Date of Birth: 19-Feb-1946

## 2020-01-11 NOTE — Patient Instructions (Signed)
Access Code: [email or web address] URL: [email or web address] Date: 01/11/2020 Prepared by: Misty Stanley  Exercises Standing Hip Abduction with Counter Support - 1 x daily - 7 x weekly - 2 sets - 10 reps Heel Toe Raises with Counter Support - 1 x daily - 7 x weekly - 2 sets - 10 reps Narrow Stance with Counter Support - 1 x daily - 7 x weekly - 3 sets - 10 seconds hold Standing Quarter Turn with Counter Support - 1 x daily - 7 x weekly - 2 sets - 10 reps Standing Single Leg Stance with Counter Support - 1 x daily - 7 x weekly - 2 sets - 10 second hold

## 2020-01-15 ENCOUNTER — Telehealth: Payer: Self-pay | Admitting: Pharmacist

## 2020-01-15 NOTE — Telephone Encounter (Signed)
Contacted patient regarding BP management. Reports medication adherence and no side effects with losartan 50 mg daily. Report she visited the Afib clinic on 01/09/20 in which a Linq monitor may be implanted to monitor for any afib. Patient reports she has an appointment with Dr. Rayann Heman on 01/23/20 and she will discuss blood pressure readings at that visit. Patient requested for a follow-up call in 2 weeks.  Plan: Will call patient in 2 weeks.

## 2020-01-18 ENCOUNTER — Ambulatory Visit: Payer: Medicare Other | Admitting: Physical Therapy

## 2020-01-18 ENCOUNTER — Encounter: Payer: Self-pay | Admitting: Physical Therapy

## 2020-01-18 ENCOUNTER — Other Ambulatory Visit: Payer: Self-pay | Admitting: Gastroenterology

## 2020-01-18 ENCOUNTER — Other Ambulatory Visit: Payer: Self-pay

## 2020-01-18 DIAGNOSIS — R296 Repeated falls: Secondary | ICD-10-CM

## 2020-01-18 DIAGNOSIS — M6281 Muscle weakness (generalized): Secondary | ICD-10-CM | POA: Diagnosis not present

## 2020-01-18 DIAGNOSIS — R262 Difficulty in walking, not elsewhere classified: Secondary | ICD-10-CM

## 2020-01-18 DIAGNOSIS — R2681 Unsteadiness on feet: Secondary | ICD-10-CM | POA: Diagnosis not present

## 2020-01-18 DIAGNOSIS — R42 Dizziness and giddiness: Secondary | ICD-10-CM | POA: Diagnosis not present

## 2020-01-18 NOTE — Patient Instructions (Signed)
Access Code: [email or web address] URL: [email or web address] Date: 01/11/2020 Prepared by: Misty Stanley  Exercises Standing Hip Abduction with Counter Support - 1 x daily - 7 x weekly - 2 sets - 10 reps Heel Toe Raises with Counter Support - 1 x daily - 7 x weekly - 2 sets - 10 reps Narrow Stance with Counter Support - 1 x daily - 7 x weekly - 3 sets - 10 seconds hold Standing Quarter Turn with Counter Support - 1 x daily - 7 x weekly - 2 sets - 10 reps Standing Single Leg Stance with Counter Support - 1 x daily - 7 x weekly - 2 sets - 10 second hold   Walking with the cane around your island: Walking forwards looking to the right for 2-3 steps, and then looking left for 2-3 steps.  Side stepping to the right with the cane x 10 steps.  Side stepping to the left with the cane x 10 reps.

## 2020-01-19 NOTE — Therapy (Signed)
[facility] [address] [place], [place], [address] Phone: [phone]   Fax:  [phone]  Physical Therapy Treatment  Patient Details  Name: [name] MRN: [id number] Date of Birth: [birth year] Referring Provider (PT): Leta Baptist, MD   Encounter Date: 01/18/2020   PT End of Session - 01/19/20 1658    Visit Number 4    Number of Visits 17    Date for PT Re-Evaluation 02/22/20    Authorization Type Medicare A&B/[employer]    Progress Note Due on Visit 10    PT Start Time 1150    PT Stop Time 1235    PT Time Calculation (min) 45 min    Activity Tolerance Patient tolerated treatment well    Behavior During Therapy [facility] for tasks assessed/performed           Past Medical History:  Diagnosis Date  . Allergy   . Anemia   . Antral polyp    benign  . Anxiety   . Arthritis   . Asthma   . B12 deficiency   . Back pain   . Chest pain   . Constipation   . Depression   . Diverticulosis   . Gallbladder problem   . GERD (gastroesophageal reflux disease) 09/09/2011  . History of transfusion   . HTN (hypertension) 09/09/2011  . Hyperlipemia 09/09/2011  . Hypertension   . IBS (irritable bowel syndrome)   . Internal hemorrhoid   . Joint pain   . Obesity   . Osteoarthritis   . Paroxysmal atrial fibrillation (HCC)   . PONV (postoperative nausea and vomiting)    with big surgeries, none with endos etc, with colonoscopy- 5-6 years ago could not swallow   . Shortness of breath   . Sleep apnea    borderline per patient no cpap   . SOB (shortness of breath) on exertion   . Swallowing difficulty   . Tachycardia   . Unstable angina ([place]) 09/09/2011  . Vitamin D deficiency   . Vocal cord dysfunction 2013   swelling    Past Surgical History:  Procedure Laterality Date  . ABDOMINAL HYSTERECTOMY  1988  . ANKLE FUSION Right    x 2  . BACK SURGERY  [phone]   3 ruptured discs, lower back  . BILATERAL ANTERIOR  TOTAL HIP ARTHROPLASTY Bilateral 11/12/2014   Procedure: BILATERAL ANTERIOR TOTAL HIP ARTHROPLASTY;  Surgeon: Paralee Cancel, MD;  Location: [facility];  Service: Orthopedics;  Laterality: Bilateral;  . BRAVO [place] STUDY [name] 03/06/2013   Procedure: BRAVO [place] STUDY;  Surgeon: Inda Castle, MD;  Location: [facility];  Service: Endoscopy;  Laterality: [name];  . CARDIAC CATHETERIZATION  2013   NORMAL  . CHOLECYSTECTOMY    . COLONOSCOPY    . ELBOW SURGERY Left    removed bone chip  . ESOPHAGOGASTRODUODENOSCOPY [name] 03/06/2013   Procedure: ESOPHAGOGASTRODUODENOSCOPY (EGD);  Surgeon: Inda Castle, MD;  Location: [facility];  Service: Endoscopy;  Laterality: [name];  . FOOT OSTEOTOMY W/ PLANTAR FASCIA RELEASE Left   . ILIOTIBIAL BAND RELEASE Right 1997  . JOINT REPLACEMENT    . LAPAROSCOPIC GASTRIC SLEEVE RESECTION WITH HIATAL HERNIA REPAIR  08/20/2014   Procedure: LAPAROSCOPIC GASTRIC SLEEVE RESECTION WITH HIATAL HERNIA  REPAIR AND  UPPER ENDOSCOPY;  Surgeon: Johnathan Hausen, MD;  Location: [facility];  Service: General;;  . LEFT HEART CATHETERIZATION WITH CORONARY ANGIOGRAM Bilateral 09/10/2011   Procedure: LEFT HEART CATHETERIZATION WITH CORONARY ANGIOGRAM;  Surgeon: Lorretta Harp,  MD;  Location: [facility];  Service: Cardiovascular;  Laterality: Bilateral;  . NASAL SINUS SURGERY  1997  . REVERSE SHOULDER ARTHROPLASTY Right 05/14/2016   Procedure: RIGHT REVERSE SHOULDER ARTHROPLASTY;  Surgeon: Netta Cedars, MD;  Location: [facility];  Service: Orthopedics;  Laterality: Right;  . SHOULDER SURGERY Left   . tfc wrist Left 1997  . thumb replacement Bilateral    one 2009 and one 2014  . TOTAL ANKLE REPLACEMENT Right    x 3  . TOTAL ANKLE REPLACEMENT Left   . TOTAL ANKLE REPLACEMENT    . TOTAL HIP ARTHROPLASTY Bilateral   . TOTAL KNEE ARTHROPLASTY Bilateral    left 2002, right 2012  . TOTAL SHOULDER ARTHROPLASTY Right 05/14/2016  . TRANSTHORACIC ECHOCARDIOGRAM  09/09/2011   MILD CONCENTRIC LVH. TRACE MITRAL  REGURG. TRACE TR. MILD AORTIC REGURG.  . 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 UPPER GI ENDOSCOPY  08/20/2014   Procedure: UPPER GI ENDOSCOPY;  Surgeon: Johnathan Hausen, MD;  Location: [facility];  Service: General;;  . WRIST SURGERY Left 1997, 2009     There were no vitals filed for this visit.   Subjective Assessment - 01/18/20 1155    Subjective L shoulder began popping loudly and is more painful; has sharp pain with ABD and ER. No falls.  Is holding LUE close to body.  Did start working on exercises; is performing them every other.  Isn't sure she is doing it right.  [place] been able to walk [place] church.  Was able to find a rubber quad tip for her cane.    Patient is accompained by: Family member    Pertinent History anxiety, OA, back pain, chest pain, depression, HTN, hyperlipidemia, IBS, Afib, sleep apnea, multiple surgeries including R ankle fusion, shoulder replacement, bilat THA, back surgery, bilat TKA, and wrist surgery, obesity, bilat sensorineural hearing loss, tinnitus L ear, L ocular aneurysm with diplopia and multiple childhood ear infections, L foot osteotomy with plantar fascia release, R IT band release, bilat thumb replacement,    Limitations Standing;Walking    Diagnostic tests Vestibular function tests - central and peripheral dizziness    Patient Stated Goals Quit falling, husband's goal for her to be able to get up from the floor if she does fall              [facility] PT Assessment - 01/18/20 1201      Palpation   Palpation comment Performed palpation over shoulder joint and soft tissue on LUE.  No tenderness to palpation over scapular or rotator cuff muscles.  Pt very tender to palpation over bicep insertion.  Pt reported no pain with resisted ER and IR; did have pain with resisted elbow flexion.  Pt to have further evaluated at orthopedic physician's office.  Recommended pt continue to ice shoulder.                         [facility]  Treatment/Exercise - 01/18/20 1201      Ambulation/Gait   Ambulation/Gait Yes    Ambulation/Gait Assistance 5: Supervision    Ambulation/Gait Assistance Details due to LUE pain changed cane to RUE and assessed gait with cane in RUE.  Pt able to demonstrate safe gait sequence and no LOB but still having increased difficulty with turns    Ambulation Distance (Feet) 230 Feet    Assistive device Straight cane    Gait Pattern Step-through pattern;Decreased stride  length;Decreased trunk rotation;Wide base of support;Decreased arm swing - left    Ambulation Surface Level;Indoor      Therapeutic Activites    Therapeutic Activities Lifting    Lifting Reviewed safe reaching to the floor, picking up box and tissues from floor.  Educated pt on widening legs to lower COG and shifting hips backwards while bending forwards to the ground.  Performed x 3 safely.        Exercises   Exercises Other Exercises    Other Exercises  Reviewed heel/toe raises, pt demonstrated how she is performing at home with UE support.  Pt has been performing correctly.                 Balance Exercises - 01/18/20 1225      Balance Exercises: Standing   Gait with Head Turns Forward;Retro;3 reps;Limitations    Gait with Head Turns Limitations head turns L and R then up, L and R with cane in RUE, min A for balance.  Mild wooziness.    Sidestepping 2 reps;Limitations    Sidestepping Limitations to L and R x 2 sets with cane in RUE, min A for balance and cues for sequencing             PT Education - 01/19/20 1657    Education Details reviewed heel-toe raises; added walking with cane to HEP, possible bicep tendonopathy - continue icing.  Discussed pt's wishes for more therapy visits.  Pt would like to try two more visits; two more added.    Person(s) Educated Patient;Spouse    Methods Explanation;Demonstration;Handout    Comprehension Verbalized understanding;Returned demonstration            PT Short Term  Goals - 12/24/19 2113      PT SHORT TERM GOAL #1   Title Pt will participate in MMT and further balance/falls risk assessment with BERG and FGA and will initiate HEP for LE strengthening and balance.    Time 4    Period Weeks    Status New    Target Date 01/23/20      PT SHORT TERM GOAL #2   Title Pt will participate in full vestibular/visual assessment and will initiate vestibular HEP and walking program    Time 4    Period Weeks    Status New    Target Date 01/23/20      PT SHORT TERM GOAL #3   Title Pt will demonstrate ability to rise from floor with supervision and UE support on furniture.    Time 4    Period Weeks    Status New    Target Date 01/23/20             PT Long Term Goals - 12/31/19 1437      PT LONG TERM GOAL #1   Title Pt will demonstrate independence with walking program and final vestibular/balance HEP  (ALL LTG due 12/3)    Time 8    Period Weeks    Status New      PT LONG TERM GOAL #2   Title Pt will improve FGA by 4 points to indicate decreased falls risk in the community    Baseline 6/30    Time 8    Period Weeks    Status New      PT LONG TERM GOAL #3   Title Pt will increase BERG by 8 points to indicate decreased falls risk.    Baseline 38    Time 8  Period Weeks    Status Revised      PT LONG TERM GOAL #4   Title Pt will demonstrate ability to reach down to the floor multiple times and turn/pivot to L and R without dizziness or LOB to improve safety with daily activities    Time 8    Period Weeks    Status New      PT LONG TERM GOAL #5   Title Pt will ambulate outside over paved surfaces x 250' MOD I with intermittent head turns to L and R without LOB    Time 8    Period Weeks    Status New      PT LONG TERM GOAL #6   Title Pt will improve function on FOTO to >67% and will decrease DHI to </= 22    Baseline 55%; 40    Time 8    Period Weeks    Status New                 Plan - 01/19/20 1659    Clinical  Impression Statement Pt now reporting pain in L shoulder without precipitating event.  Focused gait training today with cane on safe gait with cane in RUE instead of LUE.  Performed gait with turns to L and R, side stepping and gait with head turns/nods.  Pt demonstrates greatest imbalance with head and body turns.  Pt to continue with therapy for two more visits.  Will continue to address.    Personal Factors and Comorbidities Behavior Pattern;Comorbidity 3+;Fitness;Past/Current Experience    Comorbidities anxiety, OA, back pain, chest pain, depression, HTN, hyperlipidemia, IBS, Afib, sleep apnea, multiple surgeries including R ankle fusion, shoulder replacement, bilat THA, back surgery, bilat TKA, and wrist surgery, obesity, bilat sensorineural hearing loss, tinnitus L ear, L ocular aneurysm with diplopia and multiple childhood ear infections, L foot osteotomy with plantar fascia release, R IT band release, bilat thumb replacement,    Examination-Activity Limitations Bend;Bed Mobility;Locomotion Level    Examination-Participation Restrictions Cleaning;Community Activity;Shop    Stability/Clinical Decision Making Evolving/Moderate complexity    Rehab Potential Fair    PT Frequency 2x / week    PT Duration 8 weeks    PT Treatment/Interventions ADLs/Self Care Home Management;Canalith Repostioning;Cryotherapy;Moist Heat;DME Instruction;Gait training;Stair training;Functional mobility training;Therapeutic activities;Therapeutic exercise;Balance training;Neuromuscular re-education;Patient/family education;Vestibular;Visual/perceptual remediation/compensation    PT Next Visit Plan STG.  How is walking with cane? Continue balance and strength training focusing on balance with turns, balance with head turns/balance reactions.  Transfers off of the floor.  Aquatic, personal trainer?    Consulted and Agree with Plan of Care Patient;Family member/caregiver    Family Member Consulted husband           Patient  will benefit from skilled therapeutic intervention in order to improve the following deficits and impairments:  Decreased balance, Decreased strength, Difficulty walking, Dizziness, Impaired sensation, Impaired vision/preception, Obesity, Pain  Visit Diagnosis: Repeated falls  Unsteadiness on feet  Muscle weakness (generalized)  Difficulty in walking, not elsewhere classified     Problem List Patient Active Problem List   Diagnosis Date Noted  . Reactive airway disease 11/22/2019  . GI bleed 10/14/2019  . Elevated serum creatinine 10/14/2019  . Chronic diarrhea 01/31/2018  . Irritable bowel syndrome with diarrhea 01/31/2018  . Bloating 01/31/2018  . Generalized abdominal pain 01/31/2018  . Colon cancer screening 03/24/2017  . Insulin resistance 02/15/2017  . Vitamin D deficiency 02/15/2017  . High serum vitamin B12 02/15/2017  . S/P  shoulder replacement, right 05/14/2016  . Acute blood loss anemia 12/04/2014  . Obese 11/13/2014  . S/P bilateral THA, AA 11/12/2014  . Confusion 10/13/2014  . Acute confusional state 10/13/2014  . 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 Aftercare following joint replacement 03/28/2014  . History of artificial joint 03/28/2014  . Degenerative arthritis of hip 09/27/2013  . Paroxysmal atrial fibrillation ([place]) 09/07/2013  . Tachycardia 07/03/2013  . Snoring 07/03/2013  . Diverticulosis of colon without hemorrhage 06/26/2013  . RUQ abdominal pain 01/04/2013  . Nausea without vomiting 01/04/2013  . Hoarseness of voice 01/04/2013  . Diarrhea 01/04/2013  . Broken toe 03/09/2012  . Dyspnea 11/05/2011  . Upper airway cough syndrome 11/05/2011  . Asthma, persistent controlled 11/05/2011  . Morbid obesity ([place]) 11/05/2011  . Essential hypertension 09/09/2011  . Hyperlipemia 09/09/2011  . Bursitis, trochanteric 08/09/2011  . Ankle arthropathy 05/31/2011    Rico Junker, PT, DPT 01/19/20    5:05 PM    [facility] [address] [place], [place], [address] Phone: [phone]   Fax:  [phone]  Name: EUPHA LOBB MRN: [id number] Date of Birth: [birth year]

## 2020-01-23 ENCOUNTER — Encounter: Payer: Self-pay | Admitting: Internal Medicine

## 2020-01-23 ENCOUNTER — Other Ambulatory Visit: Payer: Self-pay

## 2020-01-23 ENCOUNTER — Ambulatory Visit (INDEPENDENT_AMBULATORY_CARE_PROVIDER_SITE_OTHER): Payer: Medicare Other | Admitting: Internal Medicine

## 2020-01-23 VITALS — BP 132/82 | HR 105 | Ht 64.0 in | Wt 243.0 lb

## 2020-01-23 DIAGNOSIS — D6869 Other thrombophilia: Secondary | ICD-10-CM

## 2020-01-23 DIAGNOSIS — I480 Paroxysmal atrial fibrillation: Secondary | ICD-10-CM | POA: Diagnosis not present

## 2020-01-23 HISTORY — PX: OTHER SURGICAL HISTORY: SHX169

## 2020-01-23 NOTE — Patient Instructions (Addendum)
Medication Instructions:  1 Stop your Eliquis   Labwork: None ordered.  Testing/Procedures: None ordered.    Your physician wants you to follow-up: with the [facility] in 6 months they will call you to schedule.    Implantable Loop Recorder Placement, Care After This sheet gives you information about how to care for yourself after your procedure. Your health care provider may also give you more specific instructions. If you have problems or questions, contact your health care provider. What can I expect after the procedure? After the procedure, it is common to have:  Soreness or discomfort near the incision.  Some swelling or bruising near the incision.  Follow these instructions at home: Incision care  1.  Leave your outer dressing on for 24 hours.  After 24 hours you can remove your outer dressing and shower. 2. Leave adhesive strips in place. These skin closures may need to stay in place for 1-2 weeks. If adhesive strip edges start to loosen and curl up, you may trim the loose edges.  You may remove the strips if they have not fallen off after 2 weeks. 3. Check your incision area every day for signs of infection. Check for: a. Redness, swelling, or pain. b. Fluid or blood. c. Warmth. d. Pus or a bad smell. 4. Do not take baths, swim, or use a hot tub until your incision is completely healed. 5. If your wound site starts to bleed apply pressure.      If you have any questions/concerns please call the device clinic at [phone].  Activity  Return to your normal activities.  General instructions  Follow instructions from your health care provider about how to manage your implantable loop recorder and transmit the information. Learn how to activate a recording if this is necessary for your type of device.  Do not go through a metal detection gate, and do not let someone hold a metal detector over your chest. Show your ID card.  Do not have an MRI unless you check  with your health care provider first.  Take over-the-counter and prescription medicines only as told by your health care provider.  Keep all follow-up visits as told by your health care provider. This is important. Contact a health care provider if:  You have redness, swelling, or pain around your incision.  You have a fever.  You have pain that is not relieved by your pain medicine.  You have triggered your device because of fainting (syncope) or because of a heartbeat that feels like it is racing, slow, fluttering, or skipping (palpitations). Get help right away if you have:  Chest pain.  Difficulty breathing. Summary  After the procedure, it is common to have soreness or discomfort near the incision.  Change your dressing as told by your health care provider.  Follow instructions from your health care provider about how to manage your implantable loop recorder and transmit the information.  Keep all follow-up visits as told by your health care provider. This is important. This information is not intended to replace advice given to you by your health care provider. Make sure you discuss any questions you have with your health care provider. Document Released: 02/17/2015 Document Revised: 04/23/2017 Document Reviewed: 04/23/2017 Elsevier Patient Education  2020 [employer].

## 2020-01-23 NOTE — Progress Notes (Signed)
PCP: [name], MD  Primary EP: Dr Rayann Heman  [name] is a 74 y.o. female who presents today for routine electrophysiology followup.  Since last being seen in our clinic, the patient reports doing very well.  She is unaware of afib.  She was previously on eliquis but this was discontinued after gastric sleeve and significant weight reduction.  She wore a 7 day monitor and did not have any afib.  She has been followed in the AF clinic and there are concerned about risks of anticoagulation as well as afib burden.  She is minimally symptomatic.  She does have episodes of dizziness.  She has had several falls. She does not think that she has had syncope or presyncope. She has rare palpitations.  Today, she denies symptoms of chest pain, shortness of breath, or lower extremity edema.  The patient is otherwise without complaint today.   Past Medical History:  Diagnosis Date  . Allergy   . Anemia   . Antral polyp    benign  . Anxiety   . Arthritis   . Asthma   . B12 deficiency   . Back pain   . Chest pain   . Constipation   . Depression   . Diverticulosis   . Gallbladder problem   . GERD (gastroesophageal reflux disease) 09/09/2011  . History of transfusion   . HTN (hypertension) 09/09/2011  . Hyperlipemia 09/09/2011  . Hypertension   . IBS (irritable bowel syndrome)   . Internal hemorrhoid   . Joint pain   . Obesity   . Osteoarthritis   . Paroxysmal atrial fibrillation (HCC)   . PONV (postoperative nausea and vomiting)    with big surgeries, none with endos etc, with colonoscopy- 5-6 years ago could not swallow   . Shortness of breath   . Sleep apnea    borderline per patient no cpap   . SOB (shortness of breath) on exertion   . Swallowing difficulty   . Tachycardia   . Unstable angina ([place]) 09/09/2011  . Vitamin D deficiency   . Vocal cord dysfunction 2013   swelling   Past Surgical History:  Procedure Laterality Date  . ABDOMINAL HYSTERECTOMY  1988  . ANKLE  FUSION Right    x 2  . BACK SURGERY  [phone]   3 ruptured discs, lower back  . BILATERAL ANTERIOR TOTAL HIP ARTHROPLASTY Bilateral 11/12/2014   Procedure: BILATERAL ANTERIOR TOTAL HIP ARTHROPLASTY;  Surgeon: Paralee Cancel, MD;  Location: [facility];  Service: Orthopedics;  Laterality: Bilateral;  . BRAVO [place] STUDY [name] 03/06/2013   Procedure: BRAVO [place] STUDY;  Surgeon: Inda Castle, MD;  Location: [facility];  Service: Endoscopy;  Laterality: [name];  . CARDIAC CATHETERIZATION  2013   NORMAL  . CHOLECYSTECTOMY    . COLONOSCOPY    . ELBOW SURGERY Left    removed bone chip  . ESOPHAGOGASTRODUODENOSCOPY [name] 03/06/2013   Procedure: ESOPHAGOGASTRODUODENOSCOPY (EGD);  Surgeon: Inda Castle, MD;  Location: [facility];  Service: Endoscopy;  Laterality: [name];  . FOOT OSTEOTOMY W/ PLANTAR FASCIA RELEASE Left   . ILIOTIBIAL BAND RELEASE Right 1997  . JOINT REPLACEMENT    . LAPAROSCOPIC GASTRIC SLEEVE RESECTION WITH HIATAL HERNIA REPAIR  08/20/2014   Procedure: LAPAROSCOPIC GASTRIC SLEEVE RESECTION WITH HIATAL HERNIA  REPAIR AND  UPPER ENDOSCOPY;  Surgeon: Johnathan Hausen, MD;  Location: [facility];  Service: General;;  . LEFT HEART CATHETERIZATION WITH CORONARY ANGIOGRAM Bilateral 09/10/2011   Procedure: LEFT HEART CATHETERIZATION WITH  CORONARY ANGIOGRAM;  Surgeon: Lorretta Harp, MD;  Location: [facility] CATH LAB;  Service: Cardiovascular;  Laterality: Bilateral;  . NASAL SINUS SURGERY  1997  . REVERSE SHOULDER ARTHROPLASTY Right 05/14/2016   Procedure: RIGHT REVERSE SHOULDER ARTHROPLASTY;  Surgeon: Netta Cedars, MD;  Location: [facility];  Service: Orthopedics;  Laterality: Right;  . SHOULDER SURGERY Left   . tfc wrist Left 1997  . thumb replacement Bilateral    one 2009 and one 2014  . TOTAL ANKLE REPLACEMENT Right    x 3  . TOTAL ANKLE REPLACEMENT Left   . TOTAL ANKLE REPLACEMENT    . TOTAL HIP ARTHROPLASTY Bilateral   . TOTAL KNEE ARTHROPLASTY Bilateral    left 2002, right 2012  . TOTAL SHOULDER  ARTHROPLASTY Right 05/14/2016  . TRANSTHORACIC ECHOCARDIOGRAM  09/09/2011   MILD CONCENTRIC LVH. TRACE MITRAL REGURG. TRACE TR. MILD AORTIC REGURG.  . 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 UPPER GI ENDOSCOPY  08/20/2014   Procedure: UPPER GI ENDOSCOPY;  Surgeon: Johnathan Hausen, MD;  Location: [facility];  Service: General;;  . WRIST SURGERY Left 1997, 2009     ROS- all systems are reviewed and negatives except as per HPI above  Current Outpatient Medications  Medication Sig Dispense Refill  . acetaminophen (TYLENOL) 500 MG tablet Take 1,000 mg by mouth every 6 (six) hours as needed (for pain.).     [name] albuterol (PROVENTIL HFA;VENTOLIN HFA) 108 (90 Base) MCG/ACT inhaler Inhale 1-2 puffs into the lungs every 6 (six) hours as needed for shortness of breath or wheezing.    [name] ALPRAZolam (XANAX) 0.25 MG tablet Take 0.125 mg by mouth as needed for anxiety.    [name] apixaban (ELIQUIS) 5 MG TABS tablet Take 1 tablet (5 mg total) by mouth 2 (two) times daily. 60 tablet 5  . cetirizine (ZYRTEC) 10 MG tablet Take 10 mg by mouth as needed for allergies.    . cholecalciferol (VITAMIN D) 1000 units tablet Take 1 tablet (1,000 Units total) by mouth 2 (two) times daily. 60 tablet 0  . colestipol (COLESTID) 1 g tablet Take 1 g by mouth every other day.    . dicyclomine (BENTYL) 10 MG capsule TAKE 1 CAPSULE BY MOUTH TWICE DAILY AS NEEDED 60 capsule 0  . DULoxetine (CYMBALTA) 60 MG capsule Take 1 capsule by mouth daily.    [name] esomeprazole (NEXIUM) 40 MG capsule TAKE 1 CAPSULE BY MOUTH TWICE DAILY BEFORE A MEAL 60 capsule 5  . losartan (COZAAR) 50 MG tablet Take 1 tablet by mouth daily.    . Melatonin 1 MG CAPS Take 1 capsule by mouth at bedtime as needed.    . Multiple Vitamin (MULTIVITAMIN WITH MINERALS) TABS tablet Take 1 tablet by mouth daily. Centrum     . ondansetron (ZOFRAN-ODT) 8 MG disintegrating tablet DISSOLVE 1 TABLET IN MOUTH EVERY 8 HOURS AS NEEDED FOR NAUSEA OR VOMITING. TAKE AT LEAST ONCE A DAY. 90  tablet 0  . simvastatin (ZOCOR) 20 MG tablet Take 1 tablet (20 mg total) by mouth daily. 30 tablet 11  . vitamin B-12 (CYANOCOBALAMIN) 1000 MCG tablet Take 1,000 mcg by mouth daily.     No current facility-administered medications for this visit.    Physical Exam: Vitals:   01/23/20 1202  BP: 132/82  Pulse: (!) 105  SpO2: 99%  Weight: 243 lb (110.2 kg)  Height: 5\' 4"  (1.626 m)    GEN- The patient is well appearing, alert and oriented x  3 today.   Head- normocephalic, atraumatic Eyes-  Sclera clear, conjunctiva pink Ears- hearing intact Oropharynx- clear Lungs- Clear to ausculation bilaterally, normal work of breathing Heart- Regular rate and rhythm, no murmurs, rubs or gallops, PMI not laterally displaced GI- soft, NT, ND, + BS Extremities- no clubbing, cyanosis, or edema  Wt Readings from Last 3 Encounters:  01/23/20 243 lb (110.2 kg)  01/09/20 241 lb 3.2 oz (109.4 kg)  11/22/19 238 lb (108 kg)    Assessment and Plan:  1. Paroxysmal atrial fibrillation Remote afib  chads2vascs score is at least 3.  She is on eliquis but has also had bleeding. She has had gastric sleeve and substantial weight loss.  It is felt that long term monitoring would be beneficial to further characterize her afib burden and then determine best approach for anticoagulation. Stop eliquis today.  If we see afib on ILR, we will resume I would therefore advise implantation of an implantable loop recorder for long term arrhythmia monitoring.  Risks and benefits to ILR were discussed at length with the patient today, including but not limited to risks of bleeding and infection.  Extensive device education was performed.  Remote monitoring was also discussed at length today.  The patient understands and wishes to proceed.  We will proceed at this time with ILR implantation.  2.  Dizziness/ falls Possibly due to bradycardia or other arrhythmias We will follow with ILR to exclude an arrhythmogenic  source  3. Obesity Body mass index is 41.71 kg/m. We discussed the importance of lifestyle modification today   Thompson Grayer MD, [facility] 01/23/2020 12:26 PM      DESCRIPTION OF PROCEDURE:  Informed written consent was obtained.  The patient required no sedation for the procedure today.  The patients left chest was prepped and draped. Mapping over the patient's chest was performed to identify the appropriate ILR site.  This area was found to be the left  parasternal region over the 3rd-4th intercostal space.  The skin overlying this region was infiltrated with lidocaine for local analgesia.  A 0.5-cm incision was made at the implant site.  A subcutaneous ILR pocket was fashioned using a combination of sharp and blunt dissection.  A Medtronic Reveal Linq model LNQ 22 ([id number]) implantable loop recorder was then placed into the pocket R waves were very prominent and measured > 0.2 mV. EBL<1 ml.  Steri- Strips and a sterile dressing were then applied.  There were no early apparent complications.     CONCLUSIONS:   1. Successful implantation of a Medtronic Reveal LINQ implantable loop recorder for afib management and evaluation of palpitations, dizziness, and presyncope  2. No early apparent complications.   Follow-up in AF clinic in 6 months.  I will follow remotely in the interim.  Thompson Grayer MD, [facility] 01/23/2020 12:26 PM

## 2020-01-24 DIAGNOSIS — M25512 Pain in left shoulder: Secondary | ICD-10-CM | POA: Diagnosis not present

## 2020-01-25 ENCOUNTER — Ambulatory Visit: Payer: Medicare Other | Admitting: Physical Therapy

## 2020-01-30 ENCOUNTER — Telehealth: Payer: Self-pay | Admitting: Pharmacist

## 2020-01-30 DIAGNOSIS — Z23 Encounter for immunization: Secondary | ICD-10-CM | POA: Diagnosis not present

## 2020-01-30 NOTE — Telephone Encounter (Signed)
Left HIPAA compliant voicemail requesting patient to call pharmacy when available regarding BP management.

## 2020-02-01 ENCOUNTER — Ambulatory Visit: Payer: Medicare Other | Admitting: Physical Therapy

## 2020-02-05 DIAGNOSIS — H2513 Age-related nuclear cataract, bilateral: Secondary | ICD-10-CM | POA: Diagnosis not present

## 2020-02-05 DIAGNOSIS — H5213 Myopia, bilateral: Secondary | ICD-10-CM | POA: Diagnosis not present

## 2020-02-21 DIAGNOSIS — H25812 Combined forms of age-related cataract, left eye: Secondary | ICD-10-CM | POA: Diagnosis not present

## 2020-02-21 DIAGNOSIS — H2512 Age-related nuclear cataract, left eye: Secondary | ICD-10-CM | POA: Diagnosis not present

## 2020-02-26 ENCOUNTER — Ambulatory Visit (INDEPENDENT_AMBULATORY_CARE_PROVIDER_SITE_OTHER): Payer: Medicare Other

## 2020-02-26 DIAGNOSIS — I480 Paroxysmal atrial fibrillation: Secondary | ICD-10-CM

## 2020-02-26 LAB — CUP PACEART REMOTE DEVICE CHECK
Date Time Interrogation Session: 20211206150516
Implantable Pulse Generator Implant Date: 20211103

## 2020-02-28 DIAGNOSIS — H2511 Age-related nuclear cataract, right eye: Secondary | ICD-10-CM | POA: Diagnosis not present

## 2020-02-28 DIAGNOSIS — H25811 Combined forms of age-related cataract, right eye: Secondary | ICD-10-CM | POA: Diagnosis not present

## 2020-02-29 ENCOUNTER — Encounter: Payer: Self-pay | Admitting: Physical Therapy

## 2020-02-29 NOTE — Therapy (Signed)
[facility] [address] [place], [place], [address] Phone: [phone]   Fax:  [phone]  Patient Details  Name: [name] MRN: [id number] Date of Birth: 02-03-1946 Referring Provider:  No ref. provider found  Encounter Date: 02/29/2020  PHYSICAL THERAPY DISCHARGE SUMMARY  Visits from Start of Care: 4  Current functional level related to goals / functional outcomes: Unable to fully assess; pt was making good progress towards goals but contacted clinic and requested to cancel remaining visits due to patient having a lot going on in her life right now.  Alerted pt that she would need a new referral if she wishes to return to therapy at a later date.   Remaining deficits: Impaired balance and gait   Education / Equipment: HEP  Plan: Patient agrees to discharge.  Patient goals were met.                                                  the patient's request.  ?????         Rico Junker, PT, DPT 02/29/20    9:51 AM    [facility] [address] [place], [place], [address] Phone: [phone]   Fax:  [phone]

## 2020-03-10 NOTE — Progress Notes (Signed)
Carelink Summary Report / Loop Recorder 

## 2020-03-16 ENCOUNTER — Other Ambulatory Visit: Payer: Self-pay | Admitting: Gastroenterology

## 2020-03-24 ENCOUNTER — Other Ambulatory Visit: Payer: Self-pay | Admitting: Gastroenterology

## 2020-03-26 DIAGNOSIS — I480 Paroxysmal atrial fibrillation: Secondary | ICD-10-CM | POA: Diagnosis not present

## 2020-03-26 DIAGNOSIS — E785 Hyperlipidemia, unspecified: Secondary | ICD-10-CM | POA: Diagnosis not present

## 2020-03-26 DIAGNOSIS — I10 Essential (primary) hypertension: Secondary | ICD-10-CM | POA: Diagnosis not present

## 2020-03-26 DIAGNOSIS — Z1152 Encounter for screening for COVID-19: Secondary | ICD-10-CM | POA: Diagnosis not present

## 2020-03-26 DIAGNOSIS — R059 Cough, unspecified: Secondary | ICD-10-CM | POA: Diagnosis not present

## 2020-03-26 DIAGNOSIS — U071 COVID-19: Secondary | ICD-10-CM | POA: Diagnosis not present

## 2020-03-28 ENCOUNTER — Ambulatory Visit (INDEPENDENT_AMBULATORY_CARE_PROVIDER_SITE_OTHER): Payer: Medicare Other

## 2020-03-28 DIAGNOSIS — I480 Paroxysmal atrial fibrillation: Secondary | ICD-10-CM | POA: Diagnosis not present

## 2020-03-30 LAB — CUP PACEART REMOTE DEVICE CHECK
Date Time Interrogation Session: 20220108150631
Implantable Pulse Generator Implant Date: 20211103

## 2020-04-11 NOTE — Progress Notes (Signed)
Carelink Summary Report / Loop Recorder 

## 2020-04-15 DIAGNOSIS — Z961 Presence of intraocular lens: Secondary | ICD-10-CM | POA: Diagnosis not present

## 2020-04-19 ENCOUNTER — Other Ambulatory Visit: Payer: Self-pay | Admitting: Cardiovascular Disease

## 2020-04-21 DIAGNOSIS — D649 Anemia, unspecified: Secondary | ICD-10-CM | POA: Diagnosis not present

## 2020-04-21 DIAGNOSIS — R7301 Impaired fasting glucose: Secondary | ICD-10-CM | POA: Diagnosis not present

## 2020-04-21 DIAGNOSIS — E785 Hyperlipidemia, unspecified: Secondary | ICD-10-CM | POA: Diagnosis not present

## 2020-04-21 DIAGNOSIS — E559 Vitamin D deficiency, unspecified: Secondary | ICD-10-CM | POA: Diagnosis not present

## 2020-04-21 DIAGNOSIS — Z0000 Encounter for general adult medical examination without abnormal findings: Secondary | ICD-10-CM | POA: Diagnosis not present

## 2020-04-21 DIAGNOSIS — I10 Essential (primary) hypertension: Secondary | ICD-10-CM | POA: Diagnosis not present

## 2020-04-22 ENCOUNTER — Other Ambulatory Visit: Payer: Self-pay | Admitting: Cardiovascular Disease

## 2020-04-25 DIAGNOSIS — Z1231 Encounter for screening mammogram for malignant neoplasm of breast: Secondary | ICD-10-CM | POA: Diagnosis not present

## 2020-04-28 ENCOUNTER — Ambulatory Visit (INDEPENDENT_AMBULATORY_CARE_PROVIDER_SITE_OTHER): Payer: Medicare Other

## 2020-04-28 DIAGNOSIS — I480 Paroxysmal atrial fibrillation: Secondary | ICD-10-CM

## 2020-04-29 DIAGNOSIS — F418 Other specified anxiety disorders: Secondary | ICD-10-CM | POA: Diagnosis not present

## 2020-04-29 DIAGNOSIS — Z9884 Bariatric surgery status: Secondary | ICD-10-CM | POA: Diagnosis not present

## 2020-04-29 DIAGNOSIS — H8110 Benign paroxysmal vertigo, unspecified ear: Secondary | ICD-10-CM | POA: Diagnosis not present

## 2020-04-29 DIAGNOSIS — G4700 Insomnia, unspecified: Secondary | ICD-10-CM | POA: Diagnosis not present

## 2020-04-29 DIAGNOSIS — R7301 Impaired fasting glucose: Secondary | ICD-10-CM | POA: Diagnosis not present

## 2020-04-29 DIAGNOSIS — R82998 Other abnormal findings in urine: Secondary | ICD-10-CM | POA: Diagnosis not present

## 2020-04-29 DIAGNOSIS — Z1331 Encounter for screening for depression: Secondary | ICD-10-CM | POA: Diagnosis not present

## 2020-04-29 DIAGNOSIS — E785 Hyperlipidemia, unspecified: Secondary | ICD-10-CM | POA: Diagnosis not present

## 2020-04-29 DIAGNOSIS — I700 Atherosclerosis of aorta: Secondary | ICD-10-CM | POA: Diagnosis not present

## 2020-04-29 DIAGNOSIS — M8580 Other specified disorders of bone density and structure, unspecified site: Secondary | ICD-10-CM | POA: Diagnosis not present

## 2020-04-29 DIAGNOSIS — Z8616 Personal history of COVID-19: Secondary | ICD-10-CM | POA: Diagnosis not present

## 2020-04-29 DIAGNOSIS — I10 Essential (primary) hypertension: Secondary | ICD-10-CM | POA: Diagnosis not present

## 2020-04-29 DIAGNOSIS — Z0000 Encounter for general adult medical examination without abnormal findings: Secondary | ICD-10-CM | POA: Diagnosis not present

## 2020-04-29 DIAGNOSIS — Z1339 Encounter for screening examination for other mental health and behavioral disorders: Secondary | ICD-10-CM | POA: Diagnosis not present

## 2020-04-30 DIAGNOSIS — M19112 Post-traumatic osteoarthritis, left shoulder: Secondary | ICD-10-CM | POA: Diagnosis not present

## 2020-05-01 LAB — CUP PACEART REMOTE DEVICE CHECK
Date Time Interrogation Session: 20220210150955
Implantable Pulse Generator Implant Date: 20211103

## 2020-05-05 ENCOUNTER — Telehealth: Payer: Self-pay | Admitting: *Deleted

## 2020-05-05 NOTE — Telephone Encounter (Signed)
   [facility] Pre-operative Risk Assessment     Request for surgical clearance:  1. What type of surgery is being performed? LEFT REVERSE TOTAL SHOULDER    2. When is this surgery scheduled? 05/16/20   3. What type of clearance is required (medical clearance vs. Pharmacy clearance to hold med vs. Both)?  CLEARANCE   4. Are there any medications that need to be held prior to surgery and how long?  5. Practice name and name of physician performing surgery? [facility]    6. What is the office phone number? [phone]    7.   What is the office fax number? [place]   Anesthesia type (None, local, MAC, general) ? GENERAL

## 2020-05-05 NOTE — Telephone Encounter (Signed)
   Primary Cardiologist: Quay Burow, MD  Chart reviewed as part of pre-operative protocol coverage.  Hx: paroxysmal atrial fibrillation, normal cath in 2013, hypertension, reactive airways disease, obesity, s/p gastric sleeve.  She has been followed by Dr. Rayann Heman recently for atrial fibrillation and is s/p ILR.  She is not on anticoagulation at this time.  Patient was contacted 05/05/2020 in reference to pre-operative risk assessment for pending surgery as outlined below.  [name] was last seen on 01/23/20 by Dr. Rayann Heman.  Since that day, HAZELYNN Green has done well without chest pain, shortness of breath, syncope.  RCRI: 0.4% perioperative risk of major cardiac event (low). Pt able to achieve > 4 METs based upon DASI.  Recommendations: Therefore, based on ACC/AHA guidelines, the patient would be at acceptable risk for the planned procedure without further cardiovascular testing.   Please call with questions. Richardson Dopp, PA-C 05/05/2020, 4:49 PM

## 2020-05-05 NOTE — Telephone Encounter (Signed)
Note faxed to surgeon's office. This phone note will be removed from the preop pool. Richardson Dopp, PA-C    05/05/2020 4:55 PM

## 2020-05-05 NOTE — Progress Notes (Signed)
Carelink Summary Report / Loop Recorder 

## 2020-05-06 ENCOUNTER — Other Ambulatory Visit: Payer: Self-pay | Admitting: Gastroenterology

## 2020-05-06 NOTE — Patient Instructions (Addendum)
DUE TO COVID-19 ONLY ONE VISITOR IS ALLOWED TO COME WITH YOU AND STAY IN THE WAITING ROOM ONLY DURING PRE OP AND PROCEDURE.       Your procedure is scheduled on:  Friday, 05-16-20   Report to [facility] Main  Entrance    Report to admitting at 1:50 PM   Call this number if you have problems the morning of surgery [phone]   Do not eat food :After Midnight.   May have liquids until 1:00 PM day of surgery  CLEAR LIQUID DIET  Foods Allowed                                                                     Foods Excluded  Water, Black Coffee and tea, regular and decaf              liquids that you cannot  Plain Jell-O in any flavor  (No red)                                     see through such as: Fruit ices (not with fruit pulp)                                      milk, soups, orange juice              Iced Popsicles (No red)                                      All solid food                                   Apple juices Sports drinks like Gatorade (No red) Lightly seasoned clear broth or consume(fat free) Sugar, honey syrup     Complete one Ensure drink the morning of surgery at 1:00 PM the day of surgery.     1. The day of surgery:  ? Drink ONE (1) Pre-Surgery Clear Ensure or G2 by am the morning of surgery. Drink in one sitting. Do not sip.  ? This drink was given to you during your hospital  pre-op appointment visit. ? Nothing else to drink after completing the  Pre-Surgery Clear Ensure or G2.          If you have questions, please contact your surgeon's office.     Oral Hygiene is also important to reduce your risk of infection.                                    Remember - BRUSH YOUR TEETH THE MORNING OF SURGERY WITH YOUR REGULAR TOOTHPASTE   Do NOT smoke after Midnight   Take these medicines the morning of surgery with A SIP OF WATER: Cetirizine, Diltiazem, Duloxetine,Nexium, Simvastatin, Xanax if needed.   Okay to use inhalers and bring with you  day of  surgery.                               You may not have any metal on your body including hair pins, jewelry, and body piercings             Do not wear make-up, lotions, powders, perfumes/cologne, or deodorant             Do not wear nail polish.  Do not shave  48 hours prior to surgery.    Do not bring valuables to the hospital. [place].   Contacts, dentures or bridgework may not be worn into surgery.     Patients discharged the day of surgery will not be allowed to drive home.   Special Instructions: Bring a copy of your healthcare power of attorney and living will documents         the day of surgery if you haven't  scanned them in before.              Please read over the following fact sheets you were given: IF YOU HAVE QUESTIONS ABOUT YOUR PRE OP INSTRUCTIONS PLEASE CALL  [facility]- Preparing for Total Shoulder Arthroplasty    Before surgery, you can play an important role. Because skin is not sterile, your skin needs to be as free of germs as possible. You can reduce the number of germs on your skin by using the following products. . Benzoyl Peroxide Gel o Reduces the number of germs present on the skin o Applied twice a day to shoulder area starting two days before surgery    ==================================================================  Please follow these instructions carefully:  BENZOYL PEROXIDE 5% GEL  Please do not use if you have an allergy to benzoyl peroxide.   If your skin becomes reddened/irritated stop using the benzoyl peroxide.  Starting two days before surgery, apply as follows: 1. Apply benzoyl peroxide in the morning and at night. Apply after taking a shower. If you are not taking a shower clean entire shoulder front, back, and side along with the armpit with a clean wet washcloth.  2. Place a quarter-sized dollop on your shoulder and rub in thoroughly, making sure to  cover the front, back, and side of your shoulder, along with the armpit.   2 days before ____ AM   ____ PM              1 day before ____ AM   ____ PM                         3. Do this twice a day for two days.  (Last application is the night before surgery, AFTER using the CHG soap as described below).  4. Do NOT apply benzoyl peroxide gel on the day of surgery.   [facility] - Preparing for Surgery Before surgery, you can play an important role.  Because skin is not sterile, your skin needs to be as free of germs as possible.  You can reduce the number of germs on your skin by washing with CHG (chlorahexidine gluconate) soap before surgery.  CHG is an antiseptic cleaner which kills germs and bonds with the skin to continue killing germs even after washing. Please DO NOT use if you have  an allergy to CHG or antibacterial soaps.  If your skin becomes reddened/irritated stop using the CHG and inform your nurse when you arrive at Short Stay. Do not shave (including legs and underarms) for at least 48 hours prior to the first CHG shower.  You may shave your face/neck.  Please follow these instructions carefully:  1.  Shower with CHG Soap the night before surgery and the  morning of surgery.  2.  If you choose to wash your hair, wash your hair first as usual with your normal  shampoo.  3.  After you shampoo, rinse your hair and body thoroughly to remove the shampoo.                             4.  Use CHG as you would any other liquid soap.  You can apply chg directly to the skin and wash.  Gently with a scrungie or clean washcloth.  5.  Apply the CHG Soap to your body ONLY FROM THE NECK DOWN.   Do   not use on face/ open                           Wound or open sores. Avoid contact with eyes, ears mouth and   genitals (private parts).                       Wash face,  Genitals (private parts) with your normal soap.             6.  Wash thoroughly, paying special attention to the area where your     surgery  will be performed.  7.  Thoroughly rinse your body with warm water from the neck down.  8.  DO NOT shower/wash with your normal soap after using and rinsing off the CHG Soap.                9.  Pat yourself dry with a clean towel.            10.  Wear clean pajamas.            11.  Place clean sheets on your bed the night of your first shower and do not  sleep with pets. Day of Surgery : Do not apply any lotions/deodorants the morning of surgery.  Please wear clean clothes to the hospital/surgery center.  FAILURE TO FOLLOW THESE INSTRUCTIONS MAY RESULT IN THE CANCELLATION OF YOUR SURGERY  PATIENT SIGNATURE_________________________________  NURSE SIGNATURE__________________________________  ________________________________________________________________________

## 2020-05-06 NOTE — Progress Notes (Signed)
Please enter orders for PAT visit scheduled for 05-08-20.

## 2020-05-06 NOTE — Progress Notes (Addendum)
COVID Vaccine Completed:  x2 Date COVID Vaccine completed:  05-24-19 & 06-20-19 Has received booster: COVID vaccine manufacturer: Pfizer     Date of COVID positive in last 90 days:  03-26-2020.  Results on chart  PCP - [name], MD Cardiologist - Quay Burow, MD  Cardiac clearance on chart dated 05-05-20 by Richardson Dopp, PA-C  Chest x-ray - 03-26-2020 Dr. Brigitte Pulse.  Results on chart EKG - 01-09-20 Epic Stress Test -  ECHO - 2015 Epic Cardiac Cath - 2013 Epic Pacemaker/ICD device last checked: Long Term Monitor - 11-28-19 Epic Loop recorder - placed 12-21  Sleep Study - 2015, borderline sleep apnea CPAP - No  Fasting Blood Sugar - [name] Checks Blood Sugar _____ times a day  Blood Thinner Instructions:  [name] Aspirin Instructions: Last Dose:  Activity level:  Can go up a flight of stairs and perform activities of daily living without stopping and without symptoms of shortness of breath or chest pain      Anesthesia review: Afib, HTN, vocal cord dysfunction with spasms and  swallowing dysfunction with difficulty swallowing some foods.  Patient has had esophagus stretched but still has issues with foods such as rice and noodles.    Patient denies shortness of breath, fever, cough and chest pain at PAT appointment   Patient verbalized understanding of instructions that were given to them at the PAT appointment. Patient was also instructed that they will need to review over the PAT instructions again at home before surgery.

## 2020-05-08 ENCOUNTER — Encounter (HOSPITAL_COMMUNITY): Payer: Self-pay

## 2020-05-08 ENCOUNTER — Other Ambulatory Visit: Payer: Self-pay

## 2020-05-08 ENCOUNTER — Encounter (HOSPITAL_COMMUNITY)
Admission: RE | Admit: 2020-05-08 | Discharge: 2020-05-08 | Disposition: A | Discharge status: Home / Self Care | Payer: Medicare Other | Source: Ambulatory Visit | Attending: Orthopedic Surgery | Admitting: Orthopedic Surgery

## 2020-05-08 DIAGNOSIS — Z01812 Encounter for preprocedural laboratory examination: Secondary | ICD-10-CM | POA: Insufficient documentation

## 2020-05-08 HISTORY — DX: Pneumonia, unspecified organism: J18.9

## 2020-05-08 LAB — BASIC METABOLIC PANEL
Anion gap: 10 (ref 5–15)
BUN: 18 mg/dL (ref 8–23)
CO2: 25 mmol/L (ref 22–32)
Calcium: 9.3 mg/dL (ref 8.9–10.3)
Chloride: 105 mmol/L (ref 98–111)
Creatinine, Ser: 0.93 mg/dL (ref 0.44–1.00)
GFR, Estimated: >60 mL/min (ref >60)
Glucose, Bld: 98 mg/dL (ref 70–99)
Potassium: 4.4 mmol/L (ref 3.5–5.1)
Sodium: 140 mmol/L (ref 135–145)

## 2020-05-08 LAB — CBC
HCT: 39 % (ref 36.0–46.0)
Hemoglobin: 12.1 g/dL (ref 12.0–15.0)
MCH: 28.6 pg (ref 26.0–34.0)
MCHC: 31 g/dL (ref 30.0–36.0)
MCV: 92.2 fL (ref 80.0–100.0)
Platelets: 225 10*3/uL (ref 150–400)
RBC: 4.23 MIL/uL (ref 3.87–5.11)
RDW: 13.4 % (ref 11.5–15.5)
WBC: 6.2 10*3/uL (ref 4.0–10.5)
nRBC: 0 % (ref 0.0–0.2)

## 2020-05-08 LAB — SURGICAL PCR SCREEN
MRSA, PCR: NEGATIVE
Staphylococcus aureus: NEGATIVE

## 2020-05-08 NOTE — H&P (Signed)
Patient's anticipated LOS is less than 2 midnights, meeting these requirements: - Younger than 57 - Lives within 1 hour of care - Has a competent adult at home to recover with post-op recover - NO history of  - Chronic pain requiring opiods  - Diabetes  - Coronary Artery Disease  - Heart failure  - Heart attack  - Stroke  - DVT/VTE  - Cardiac arrhythmia  - Respiratory Failure/COPD  - Renal failure  - Anemia  - Advanced Liver disease       [name] is an 75 y.o. female.    Chief Complaint: left shoulder pain  HPI: Pt is a 75 y.o. female complaining of left shoulder pain for multiple years. Pain had continually increased since the beginning. X-rays in the clinic show end-stage arthritic changes of the left shoulder. Pt has tried various conservative treatments which have failed to alleviate their symptoms, including injections and therapy. Various options are discussed with the patient. Risks, benefits and expectations were discussed with the patient. Patient understand the risks, benefits and expectations and wishes to proceed with surgery.   PCP:  [name], MD  D/C Plans: Home  PMH: Past Medical History:  Diagnosis Date  . Allergy   . Anemia   . Antral polyp    benign  . Anxiety   . Arthritis   . Asthma   . B12 deficiency   . Back pain   . Chest pain   . Constipation   . Depression   . Diverticulosis   . Gallbladder problem   . GERD (gastroesophageal reflux disease) 09/09/2011  . History of transfusion   . HTN (hypertension) 09/09/2011  . Hyperlipemia 09/09/2011  . Hypertension   . IBS (irritable bowel syndrome)   . Internal hemorrhoid   . Joint pain   . Obesity   . Osteoarthritis   . Paroxysmal atrial fibrillation (HCC)   . PONV (postoperative nausea and vomiting)    with big surgeries, none with endos etc, with colonoscopy- 5-6 years ago could not swallow   . Shortness of breath   . Sleep apnea    borderline per patient no cpap   . SOB  (shortness of breath) on exertion   . Swallowing difficulty   . Tachycardia   . Unstable angina ([place]) 09/09/2011  . Vitamin D deficiency   . Vocal cord dysfunction 2013   swelling    PSH: Past Surgical History:  Procedure Laterality Date  . ABDOMINAL HYSTERECTOMY  1988  . ANKLE FUSION Right    x 2  . BACK SURGERY  [phone]   3 ruptured discs, lower back  . BILATERAL ANTERIOR TOTAL HIP ARTHROPLASTY Bilateral 11/12/2014   Procedure: BILATERAL ANTERIOR TOTAL HIP ARTHROPLASTY;  Surgeon: Paralee Cancel, MD;  Location: [facility];  Service: Orthopedics;  Laterality: Bilateral;  . BRAVO [place] STUDY [name] 03/06/2013   Procedure: BRAVO [place] STUDY;  Surgeon: Inda Castle, MD;  Location: [facility];  Service: Endoscopy;  Laterality: [name];  . CARDIAC CATHETERIZATION  2013   NORMAL  . CHOLECYSTECTOMY    . COLONOSCOPY    . ELBOW SURGERY Left    removed bone chip  . ESOPHAGOGASTRODUODENOSCOPY [name] 03/06/2013   Procedure: ESOPHAGOGASTRODUODENOSCOPY (EGD);  Surgeon: Inda Castle, MD;  Location: [facility];  Service: Endoscopy;  Laterality: [name];  . FOOT OSTEOTOMY W/ PLANTAR FASCIA RELEASE Left   . ILIOTIBIAL BAND RELEASE Right 1997  . implantable loop recorder placement  01/23/2020   Medtronic Reveal Linq model LNQ  22 (RLB [id number]) implantable loop recorder  . JOINT REPLACEMENT    . LAPAROSCOPIC GASTRIC SLEEVE RESECTION WITH HIATAL HERNIA REPAIR  08/20/2014   Procedure: LAPAROSCOPIC GASTRIC SLEEVE RESECTION WITH HIATAL HERNIA  REPAIR AND  UPPER ENDOSCOPY;  Surgeon: Johnathan Hausen, MD;  Location: [facility];  Service: General;;  . LEFT HEART CATHETERIZATION WITH CORONARY ANGIOGRAM Bilateral 09/10/2011   Procedure: LEFT HEART CATHETERIZATION WITH CORONARY ANGIOGRAM;  Surgeon: Lorretta Harp, MD;  Location: [facility] CATH LAB;  Service: Cardiovascular;  Laterality: Bilateral;  . NASAL SINUS SURGERY  1997  . REVERSE SHOULDER ARTHROPLASTY Right 05/14/2016   Procedure: RIGHT REVERSE SHOULDER ARTHROPLASTY;   Surgeon: Netta Cedars, MD;  Location: [place];  Service: Orthopedics;  Laterality: Right;  . SHOULDER SURGERY Left   . tfc wrist Left 1997  . thumb replacement Bilateral    one 2009 and one 2014  . TOTAL ANKLE REPLACEMENT Right    x 3  . TOTAL ANKLE REPLACEMENT Left   . TOTAL ANKLE REPLACEMENT    . TOTAL HIP ARTHROPLASTY Bilateral   . TOTAL KNEE ARTHROPLASTY Bilateral    left 2002, right 2012  . TOTAL SHOULDER ARTHROPLASTY Right 05/14/2016  . TRANSTHORACIC ECHOCARDIOGRAM  09/09/2011   MILD CONCENTRIC LVH. TRACE MITRAL REGURG. TRACE TR. MILD AORTIC REGURG.  . 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 UPPER GI ENDOSCOPY  08/20/2014   Procedure: UPPER GI ENDOSCOPY;  Surgeon: Johnathan Hausen, MD;  Location: [facility];  Service: General;;  . WRIST SURGERY Left 1997, 2009     Social History:  reports that she has never smoked. She has never used smokeless tobacco. She reports that she does not drink alcohol and does not use drugs.  Allergies:  Allergies  Allergen Reactions  . Ativan [Lorazepam] Other (See Comments)    Agitation, aggressive actions, significant mental changes  . Cefdinir Hives    headache  . Buprenorphine Hcl Itching and Rash  . Fentanyl Itching    Medications: No current facility-administered medications for this encounter.   Current Outpatient Medications  Medication Sig Dispense Refill  . acetaminophen (TYLENOL) 500 MG tablet Take 1,000 mg by mouth every 6 (six) hours as needed (for pain.).     [name] albuterol (PROVENTIL HFA;VENTOLIN HFA) 108 (90 Base) MCG/ACT inhaler Inhale 2 puffs into the lungs every 6 (six) hours as needed for shortness of breath or wheezing.    [name] ALPRAZolam (XANAX) 0.25 MG tablet Take 0.25 mg by mouth daily as needed for anxiety.    . calcium carbonate (TUMS - DOSED IN MG ELEMENTAL CALCIUM) 500 MG chewable tablet Chew 1,500 mg by mouth daily as needed for indigestion or heartburn.    . cetirizine (ZYRTEC) 10 MG tablet Take 10 mg by mouth as  needed for allergies.    . Cholecalciferol (DIALYVITE VITAMIN D 5000) 125 MCG (5000 UT) capsule Take 5,000 Units by mouth 2 (two) times daily.    [name] diltiazem (CARDIZEM CD) 240 MG 24 hr capsule Take 1 capsule by mouth once daily (Patient taking differently: Take 240 mg by mouth daily.) 90 capsule 0  . diphenhydrAMINE (BENADRYL) 25 MG tablet Take 25 mg by mouth every 6 (six) hours as needed for allergies.    Mariane Baumgarten Sodium (COLACE PO) Take 1 tablet by mouth 3 (three) times daily.    . DULoxetine (CYMBALTA) 60 MG capsule Take 60 mg by mouth daily.    [name] esomeprazole (NEXIUM) 40 MG capsule TAKE 1 CAPSULE BY MOUTH TWICE  DAILY BEFORE A MEAL (Patient taking differently: Take 40 mg by mouth 2 (two) times daily before a meal.) 60 capsule 5  . fluticasone (FLONASE) 50 MCG/ACT nasal spray Place 1 spray into both nostrils at bedtime as needed for allergies or rhinitis.    [name] losartan (COZAAR) 50 MG tablet Take 50 mg by mouth daily.    . Melatonin 5 MG CAPS Take 5 mg by mouth at bedtime.    . methocarbamol (ROBAXIN) 500 MG tablet Take 500 mg by mouth 4 (four) times daily.    . Multiple Vitamin (MULTIVITAMIN WITH MINERALS) TABS tablet Take 1 tablet by mouth daily. Centrum    . ondansetron (ZOFRAN-ODT) 8 MG disintegrating tablet DISSOLVE 1 TABLET IN MOUTH EVERY 8 HOURS AS NEEDED FOR NAUSEA AND VOMITING -  TAKE  AT  LEAST  ONCE  PER  DAY (Patient taking differently: Take 8 mg by mouth See admin instructions. Take 8 mg in the morning, may take an additional 8 mg twice daily as needed for nausea and vomiting) 90 tablet 0  . simvastatin (ZOCOR) 20 MG tablet Take 1 tablet (20 mg total) by mouth daily. 30 tablet 11  . traMADol (ULTRAM) 50 MG tablet Take 50 mg by mouth every 6 (six) hours as needed for pain.    . vitamin B-12 (CYANOCOBALAMIN) 1000 MCG tablet Take 1,000 mcg by mouth daily.    [name] dicyclomine (BENTYL) 10 MG capsule Take 1 capsule (10 mg total) by mouth 2 (two) times daily as needed for spasms. 60 capsule 0   . HYDROcodone-acetaminophen (NORCO/VICODIN) 5-325 MG tablet Take 1 tablet by mouth every 6 (six) hours as needed for pain.      No results found for this or any previous visit (from the past 48 hour(s)). No results found.  ROS: Pain with rom of the left upper extremity  Physical Exam: Alert and oriented 75 y.o. female in no acute distress Cranial nerves 2-12 intact Cervical spine: full rom with no tenderness, nv intact distally Chest: active breath sounds bilaterally, no wheeze rhonchi or rales Heart: regular rate and rhythm, no murmur Abd: non tender non distended with active bowel sounds Hip is stable with rom  Left shoulder painful and weak rom nv intact distally No rashes or edema distally  Assessment/Plan Assessment: left shoulder cuff arthropathy  Plan:  Patient will undergo a left reverse total shoulder by Dr. Veverly Fells at [facility] Risks benefits and expectations were discussed with the patient. Patient understand risks, benefits and expectations and wishes to proceed. Preoperative templating of the joint replacement has been completed, documented, and submitted to the Operating Room personnel in order to optimize intra-operative equipment management.   Merla Riches PA-C, MPAS [facility] is now [employer] [address], [place], [place], [place] [address] Phone: [phone] [email or web address] Facebook  [employer]

## 2020-05-09 NOTE — Progress Notes (Signed)
Anesthesia Chart Review   Case: [id number] Date/Time: 05/16/20 1605   Procedure: REVERSE SHOULDER ARTHROPLASTY (Left Shoulder) - interscalene exparel   Anesthesia type: General   Pre-op diagnosis: Left shoulder rotator cuff tear arthropathy   Location: [facility] ROOM 07 / [facility]   Surgeons: Netta Cedars, MD      DISCUSSION:74 y.o. never smoker with h/o PONV, sleep apnea, asthma, HTN, GERD, PAF, h/o gastric bypass, left shoulder OA scheduled for above procedure 05/16/2020 with Dr. Netta Cedars.   Per cardiology preoperative evaluation 05/05/2020, "Hx: paroxysmal atrial fibrillation, normal cath in 2013, hypertension, reactive airways disease, obesity, s/p gastric sleeve.  She has been followed by Dr. Rayann Heman recently for atrial fibrillation and is s/p ILR.  She is not on anticoagulation at this time. Patient was contacted 05/05/2020 in reference to pre-operative risk assessment for pending surgery as outlined below.  [name] was last seen on 01/23/20 by Dr. Rayann Heman.  Since that day, [name] has done well without chest pain, shortness of breath, syncope. RCRI: 0.4% perioperative risk of major cardiac event (low). Pt able to achieve > 4 METs based upon DASI. Recommendations: Therefore, based on ACC/AHA guidelines, the patient would be at acceptable risk for the planned procedure without further cardiovascular testing."  Anticipate pt can proceed with planned procedure barring acute status change.   VS: BP 125/83   Pulse 100   Temp 37 C (Oral)   Resp 16   Ht 5\' 4"  (1.626 m)   Wt 109.1 kg   SpO2 99%   BMI 41.30 kg/m   PROVIDERS: [name], MD is PCP   Quay Burow, MD is Cardiologist  LABS: Labs reviewed: Acceptable for surgery. (all labs ordered are listed, but only abnormal results are displayed)  Labs Reviewed  SURGICAL PCR SCREEN  BASIC METABOLIC PANEL  CBC     IMAGES:   EKG: 01/09/2020 Rate 94 bpm  Normal sinus rhythm Left posterior fascicular  block Anterior infarct , age undetermined Abnormal ECG No significant change since last tracing  CV: Echo 10/26/2013 Study Conclusions   - Left ventricle: The cavity size was normal. There was moderate  concentric hypertrophy. Systolic function was normal. The  estimated ejection fraction was in the range of 55% to 60%. Wall  motion was normal; there were no regional wall motion  abnormalities. Doppler parameters are consistent with abnormal  left ventricular relaxation (grade 1 diastolic dysfunction). The  E/e&' ratio is between 8-15, suggesting indeterminate LV filling  pressure.  - Aortic valve: Poorly visualized. Trileaflet. There was trivial  regurgitation.  - Right atrium: The atrium was normal in size.  Past Medical History:  Diagnosis Date  . Allergy   . Anemia   . Antral polyp    benign  . Anxiety   . Arthritis   . Asthma   . B12 deficiency   . Back pain   . Chest pain   . Constipation   . Depression   . Diverticulosis   . Gallbladder problem   . GERD (gastroesophageal reflux disease) 09/09/2011  . History of transfusion   . HTN (hypertension) 09/09/2011  . Hyperlipemia 09/09/2011  . Hypertension   . IBS (irritable bowel syndrome)   . Internal hemorrhoid   . Joint pain   . Obesity   . Osteoarthritis   . Paroxysmal atrial fibrillation (HCC)   . Pneumonia   . PONV (postoperative nausea and vomiting)    with big surgeries, none with endos etc, with colonoscopy- 5-6 years  ago could not swallow   . Shortness of breath   . Sleep apnea    borderline per patient no cpap   . SOB (shortness of breath) on exertion   . Swallowing difficulty   . Tachycardia   . Unstable angina ([place]) 09/09/2011  . Vitamin D deficiency   . Vocal cord dysfunction 2013   swelling    Past Surgical History:  Procedure Laterality Date  . ABDOMINAL HYSTERECTOMY  1988  . ANKLE FUSION Right    x 2  . BACK SURGERY  [phone]   3 ruptured discs, lower back  .  BILATERAL ANTERIOR TOTAL HIP ARTHROPLASTY Bilateral 11/12/2014   Procedure: BILATERAL ANTERIOR TOTAL HIP ARTHROPLASTY;  Surgeon: Paralee Cancel, MD;  Location: [facility];  Service: Orthopedics;  Laterality: Bilateral;  . BRAVO [place] STUDY [name] 03/06/2013   Procedure: BRAVO [place] STUDY;  Surgeon: Inda Castle, MD;  [facility];  Service: Endoscopy;  Laterality: [name];  . CARDIAC CATHETERIZATION  2013   NORMAL  . CHOLECYSTECTOMY    . COLONOSCOPY    . ELBOW SURGERY Left    removed bone chip  . ESOPHAGOGASTRODUODENOSCOPY [name] 03/06/2013   Procedure: ESOPHAGOGASTRODUODENOSCOPY (EGD);  Surgeon: Inda Castle, MD;  Location: [facility];  Service: Endoscopy;  Laterality: [name];  . FOOT OSTEOTOMY W/ PLANTAR FASCIA RELEASE Left   . ILIOTIBIAL BAND RELEASE Right 1997  . implantable loop recorder placement  01/23/2020   Medtronic Reveal Linq model LNQ 22 (RLB [id number]) implantable loop recorder  . JOINT REPLACEMENT    . LAPAROSCOPIC GASTRIC SLEEVE RESECTION WITH HIATAL HERNIA REPAIR  08/20/2014   Procedure: LAPAROSCOPIC GASTRIC SLEEVE RESECTION WITH HIATAL HERNIA  REPAIR AND  UPPER ENDOSCOPY;  Surgeon: Johnathan Hausen, MD;  Location: [facility];  Service: General;;  . LEFT HEART CATHETERIZATION WITH CORONARY ANGIOGRAM Bilateral 09/10/2011   Procedure: LEFT HEART CATHETERIZATION WITH CORONARY ANGIOGRAM;  Surgeon: Lorretta Harp, MD;  Location: [facility] CATH LAB;  Service: Cardiovascular;  Laterality: Bilateral;  . NASAL SINUS SURGERY  1997  . REVERSE SHOULDER ARTHROPLASTY Right 05/14/2016   Procedure: RIGHT REVERSE SHOULDER ARTHROPLASTY;  Surgeon: Netta Cedars, MD;  Location: [place];  Service: Orthopedics;  Laterality: Right;  . SHOULDER SURGERY Left   . tfc wrist Left 1997  . thumb replacement Bilateral    one 2009 and one 2014  . TOTAL ANKLE REPLACEMENT Right    x 3  . TOTAL ANKLE REPLACEMENT Left   . TOTAL ANKLE REPLACEMENT    . TOTAL HIP ARTHROPLASTY Bilateral   . TOTAL KNEE ARTHROPLASTY Bilateral    left  2002, right 2012  . TOTAL SHOULDER ARTHROPLASTY Right 05/14/2016  . TRANSTHORACIC ECHOCARDIOGRAM  09/09/2011   MILD CONCENTRIC LVH. TRACE MITRAL REGURG. TRACE TR. MILD AORTIC REGURG.  . 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 UPPER GI ENDOSCOPY  08/20/2014   Procedure: UPPER GI ENDOSCOPY;  Surgeon: Johnathan Hausen, MD;  [facility]: [facility];  Service: General;;  . WRIST SURGERY Left 1997, 2009     MEDICATIONS: . acetaminophen (TYLENOL) 500 MG tablet  . albuterol (PROVENTIL HFA;VENTOLIN HFA) 108 (90 Base) MCG/ACT inhaler  . ALPRAZolam (XANAX) 0.25 MG tablet  . calcium carbonate (TUMS - DOSED IN MG ELEMENTAL CALCIUM) 500 MG chewable tablet  . cetirizine (ZYRTEC) 10 MG tablet  . Cholecalciferol (DIALYVITE VITAMIN D 5000) 125 MCG (5000 UT) capsule  . dicyclomine (BENTYL) 10 MG capsule  . diltiazem (CARDIZEM CD) 240 MG 24 hr capsule  . diphenhydrAMINE (  BENADRYL) 25 MG tablet  . Docusate Sodium (COLACE PO)  . DULoxetine (CYMBALTA) 60 MG capsule  . esomeprazole (NEXIUM) 40 MG capsule  . fluticasone (FLONASE) 50 MCG/ACT nasal spray  . HYDROcodone-acetaminophen (NORCO/VICODIN) 5-325 MG tablet  . losartan (COZAAR) 50 MG tablet  . Melatonin 5 MG CAPS  . methocarbamol (ROBAXIN) 500 MG tablet  . Multiple Vitamin (MULTIVITAMIN WITH MINERALS) TABS tablet  . ondansetron (ZOFRAN-ODT) 8 MG disintegrating tablet  . simvastatin (ZOCOR) 20 MG tablet  . traMADol (ULTRAM) 50 MG tablet  . vitamin B-12 (CYANOCOBALAMIN) 1000 MCG tablet   No current facility-administered medications for this encounter.     Konrad Felix, PA-C [facility] [phone]

## 2020-05-16 ENCOUNTER — Encounter (HOSPITAL_COMMUNITY)
Admission: RE | Disposition: A | Discharge status: Home / Self Care | Payer: Self-pay | Source: Home / Self Care | Attending: Orthopedic Surgery

## 2020-05-16 ENCOUNTER — Observation Stay (HOSPITAL_COMMUNITY): Payer: Medicare Other

## 2020-05-16 ENCOUNTER — Encounter (HOSPITAL_COMMUNITY): Payer: Self-pay | Admitting: Orthopedic Surgery

## 2020-05-16 ENCOUNTER — Ambulatory Visit (HOSPITAL_COMMUNITY): Payer: Medicare Other | Admitting: Physician Assistant

## 2020-05-16 ENCOUNTER — Observation Stay (HOSPITAL_COMMUNITY)
Admission: RE | Admit: 2020-05-16 | Discharge: 2020-05-17 | Disposition: A | Discharge status: Home / Self Care | Payer: Medicare Other | Attending: Orthopedic Surgery | Admitting: Orthopedic Surgery

## 2020-05-16 ENCOUNTER — Other Ambulatory Visit: Payer: Self-pay

## 2020-05-16 ENCOUNTER — Ambulatory Visit (HOSPITAL_COMMUNITY): Payer: Medicare Other | Admitting: Registered Nurse

## 2020-05-16 DIAGNOSIS — Z96653 Presence of artificial knee joint, bilateral: Secondary | ICD-10-CM | POA: Diagnosis not present

## 2020-05-16 DIAGNOSIS — Z96692 Finger-joint replacement of left hand: Secondary | ICD-10-CM | POA: Insufficient documentation

## 2020-05-16 DIAGNOSIS — M19012 Primary osteoarthritis, left shoulder: Principal | ICD-10-CM | POA: Insufficient documentation

## 2020-05-16 DIAGNOSIS — I10 Essential (primary) hypertension: Secondary | ICD-10-CM | POA: Diagnosis not present

## 2020-05-16 DIAGNOSIS — Z96612 Presence of left artificial shoulder joint: Secondary | ICD-10-CM | POA: Diagnosis not present

## 2020-05-16 DIAGNOSIS — Z96643 Presence of artificial hip joint, bilateral: Secondary | ICD-10-CM | POA: Insufficient documentation

## 2020-05-16 DIAGNOSIS — G8918 Other acute postprocedural pain: Secondary | ICD-10-CM | POA: Diagnosis not present

## 2020-05-16 DIAGNOSIS — Z96662 Presence of left artificial ankle joint: Secondary | ICD-10-CM | POA: Insufficient documentation

## 2020-05-16 DIAGNOSIS — J45909 Unspecified asthma, uncomplicated: Secondary | ICD-10-CM | POA: Diagnosis not present

## 2020-05-16 DIAGNOSIS — Z96642 Presence of left artificial hip joint: Secondary | ICD-10-CM | POA: Diagnosis not present

## 2020-05-16 DIAGNOSIS — Z79899 Other long term (current) drug therapy: Secondary | ICD-10-CM | POA: Insufficient documentation

## 2020-05-16 DIAGNOSIS — Z96611 Presence of right artificial shoulder joint: Secondary | ICD-10-CM | POA: Diagnosis not present

## 2020-05-16 DIAGNOSIS — Z96691 Finger-joint replacement of right hand: Secondary | ICD-10-CM | POA: Insufficient documentation

## 2020-05-16 DIAGNOSIS — M75102 Unspecified rotator cuff tear or rupture of left shoulder, not specified as traumatic: Secondary | ICD-10-CM | POA: Insufficient documentation

## 2020-05-16 DIAGNOSIS — E559 Vitamin D deficiency, unspecified: Secondary | ICD-10-CM | POA: Diagnosis not present

## 2020-05-16 DIAGNOSIS — Z96661 Presence of right artificial ankle joint: Secondary | ICD-10-CM | POA: Diagnosis not present

## 2020-05-16 DIAGNOSIS — Z471 Aftercare following joint replacement surgery: Secondary | ICD-10-CM | POA: Diagnosis not present

## 2020-05-16 DIAGNOSIS — I480 Paroxysmal atrial fibrillation: Secondary | ICD-10-CM | POA: Diagnosis not present

## 2020-05-16 HISTORY — PX: REVERSE SHOULDER ARTHROPLASTY: SHX5054

## 2020-05-16 SURGERY — ARTHROPLASTY, SHOULDER, TOTAL, REVERSE
Anesthesia: Regional | Site: Shoulder | Laterality: Left

## 2020-05-16 MED ORDER — AMISULPRIDE (ANTIEMETIC) 5 MG/2ML IV SOLN: 10.0000 mg | Freq: Once | INTRAVENOUS | Status: DC | PRN

## 2020-05-16 MED ORDER — HYDROCODONE-ACETAMINOPHEN 5-325 MG PO TABS
1.0000 | ORAL_TABLET | ORAL | Status: DC | PRN
  Administered 2020-05-16: 1 via ORAL
  Administered 2020-05-17 (×2): 2 via ORAL
  Filled 2020-05-16: qty 1
  Filled 2020-05-16 (×2): qty 2

## 2020-05-16 MED ORDER — DULOXETINE HCL 60 MG PO CPEP
60.0000 mg | ORAL_CAPSULE | Freq: Every day | ORAL | Status: DC
  Administered 2020-05-17: 60 mg via ORAL
  Filled 2020-05-16: qty 1

## 2020-05-16 MED ORDER — CELECOXIB 200 MG PO CAPS
200.0000 mg | ORAL_CAPSULE | Freq: Once | ORAL | Status: AC
  Administered 2020-05-16: 200 mg via ORAL
  Filled 2020-05-16: qty 1

## 2020-05-16 MED ORDER — METHOCARBAMOL 500 MG PO TABS
500.0000 mg | ORAL_TABLET | Freq: Four times a day (QID) | ORAL | Status: DC | PRN
  Administered 2020-05-16 – 2020-05-17 (×2): 500 mg via ORAL
  Filled 2020-05-16 (×3): qty 1

## 2020-05-16 MED ORDER — CLINDAMYCIN PHOSPHATE 900 MG/50ML IV SOLN
900.0000 mg | INTRAVENOUS | Status: AC
  Administered 2020-05-16: 900 mg via INTRAVENOUS
  Filled 2020-05-16: qty 50

## 2020-05-16 MED ORDER — VITAMIN D 25 MCG (1000 UNIT) PO TABS
5000.0000 [IU] | ORAL_TABLET | Freq: Two times a day (BID) | ORAL | Status: DC
  Administered 2020-05-16 – 2020-05-17 (×2): 5000 [IU] via ORAL
  Filled 2020-05-16 (×2): qty 5

## 2020-05-16 MED ORDER — ORAL CARE MOUTH RINSE: 15.0000 mL | Freq: Once | OROMUCOSAL | Status: AC

## 2020-05-16 MED ORDER — FENTANYL CITRATE (PF) 100 MCG/2ML IJ SOLN
INTRAMUSCULAR | Status: AC
  Filled 2020-05-16: qty 2

## 2020-05-16 MED ORDER — BUPIVACAINE-EPINEPHRINE (PF) 0.25% -1:200000 IJ SOLN
INTRAMUSCULAR | Status: DC | PRN
  Administered 2020-05-16: 17 mL via PERINEURAL

## 2020-05-16 MED ORDER — ACETAMINOPHEN 500 MG PO TABS
1000.0000 mg | ORAL_TABLET | Freq: Once | ORAL | Status: AC
  Administered 2020-05-16: 1000 mg via ORAL
  Filled 2020-05-16: qty 2

## 2020-05-16 MED ORDER — DOCUSATE SODIUM 100 MG PO CAPS: 100.0000 mg | ORAL_CAPSULE | Freq: Two times a day (BID) | ORAL | Status: DC

## 2020-05-16 MED ORDER — BUPIVACAINE LIPOSOME 1.3 % IJ SUSP
INTRAMUSCULAR | Status: DC | PRN
  Administered 2020-05-16: 10 mL via PERINEURAL

## 2020-05-16 MED ORDER — ONDANSETRON HCL 4 MG/2ML IJ SOLN
INTRAMUSCULAR | Status: DC | PRN
  Administered 2020-05-16: 4 mg via INTRAVENOUS

## 2020-05-16 MED ORDER — PROPOFOL 10 MG/ML IV BOLUS
INTRAVENOUS | Status: DC | PRN
  Administered 2020-05-16: 150 mg via INTRAVENOUS

## 2020-05-16 MED ORDER — SODIUM CHLORIDE 0.9 % IV SOLN: INTRAVENOUS | Status: DC

## 2020-05-16 MED ORDER — MORPHINE SULFATE (PF) 2 MG/ML IV SOLN: 0.5000 mg | INTRAVENOUS | Status: DC | PRN

## 2020-05-16 MED ORDER — FLUTICASONE PROPIONATE 50 MCG/ACT NA SUSP: 1.0000 | Freq: Every evening | NASAL | Status: DC | PRN

## 2020-05-16 MED ORDER — LIDOCAINE 2% (20 MG/ML) 5 ML SYRINGE
INTRAMUSCULAR | Status: DC | PRN
  Administered 2020-05-16: 40 mg via INTRAVENOUS

## 2020-05-16 MED ORDER — ROCURONIUM BROMIDE 10 MG/ML (PF) SYRINGE
PREFILLED_SYRINGE | INTRAVENOUS | Status: DC | PRN
  Administered 2020-05-16: 60 mg via INTRAVENOUS

## 2020-05-16 MED ORDER — DIPHENHYDRAMINE HCL 25 MG PO TABS
25.0000 mg | ORAL_TABLET | Freq: Four times a day (QID) | ORAL | Status: DC | PRN
  Filled 2020-05-16: qty 1

## 2020-05-16 MED ORDER — FENTANYL CITRATE (PF) 100 MCG/2ML IJ SOLN
INTRAMUSCULAR | Status: DC | PRN
  Administered 2020-05-16: 100 ug via INTRAVENOUS

## 2020-05-16 MED ORDER — SODIUM CHLORIDE 0.9 % IR SOLN
Status: DC | PRN
  Administered 2020-05-16: 1000 mL

## 2020-05-16 MED ORDER — DOCUSATE SODIUM 100 MG PO CAPS
100.0000 mg | ORAL_CAPSULE | Freq: Two times a day (BID) | ORAL | Status: DC
  Administered 2020-05-16 – 2020-05-17 (×2): 100 mg via ORAL
  Filled 2020-05-16 (×2): qty 1

## 2020-05-16 MED ORDER — METHOCARBAMOL 500 MG IVPB - SIMPLE MED
500.0000 mg | Freq: Four times a day (QID) | INTRAVENOUS | Status: DC | PRN
  Filled 2020-05-16: qty 50

## 2020-05-16 MED ORDER — ONDANSETRON HCL 4 MG/2ML IJ SOLN: 4.0000 mg | Freq: Four times a day (QID) | INTRAMUSCULAR | Status: DC | PRN

## 2020-05-16 MED ORDER — PROPOFOL 10 MG/ML IV BOLUS
INTRAVENOUS | Status: AC
  Filled 2020-05-16: qty 20

## 2020-05-16 MED ORDER — DEXAMETHASONE SODIUM PHOSPHATE 10 MG/ML IJ SOLN
INTRAMUSCULAR | Status: DC | PRN
  Administered 2020-05-16: 8 mg via INTRAVENOUS

## 2020-05-16 MED ORDER — FENTANYL CITRATE (PF) 100 MCG/2ML IJ SOLN
50.0000 ug | INTRAMUSCULAR | Status: DC
  Administered 2020-05-16: 100 ug via INTRAVENOUS
  Filled 2020-05-16: qty 2

## 2020-05-16 MED ORDER — ONDANSETRON HCL 4 MG PO TABS: 4.0000 mg | ORAL_TABLET | Freq: Four times a day (QID) | ORAL | Status: DC | PRN

## 2020-05-16 MED ORDER — DICYCLOMINE HCL 10 MG PO CAPS
10.0000 mg | ORAL_CAPSULE | Freq: Two times a day (BID) | ORAL | Status: DC | PRN
  Filled 2020-05-16: qty 1

## 2020-05-16 MED ORDER — ACETAMINOPHEN 325 MG PO TABS: 325.0000 mg | ORAL_TABLET | Freq: Four times a day (QID) | ORAL | Status: DC | PRN

## 2020-05-16 MED ORDER — PHENYLEPHRINE HCL-NACL 20-0.9 MG/250ML-% IV SOLN
INTRAVENOUS | Status: DC | PRN
  Administered 2020-05-16: 20 ug/min via INTRAVENOUS

## 2020-05-16 MED ORDER — ACETAMINOPHEN 500 MG PO TABS: 1000.0000 mg | ORAL_TABLET | Freq: Four times a day (QID) | ORAL | Status: DC | PRN

## 2020-05-16 MED ORDER — METOCLOPRAMIDE HCL 5 MG PO TABS: 5.0000 mg | ORAL_TABLET | Freq: Three times a day (TID) | ORAL | Status: DC | PRN

## 2020-05-16 MED ORDER — STERILE WATER FOR IRRIGATION IR SOLN
Status: DC | PRN
Start: 2020-05-16 — End: 2020-05-16
  Administered 2020-05-16: 2000 mL

## 2020-05-16 MED ORDER — ONDANSETRON 4 MG PO TBDP: 8.0000 mg | ORAL_TABLET | ORAL | Status: DC

## 2020-05-16 MED ORDER — MENTHOL 3 MG MT LOZG: 1.0000 | LOZENGE | OROMUCOSAL | Status: DC | PRN

## 2020-05-16 MED ORDER — PHENOL 1.4 % MT LIQD: 1.0000 | OROMUCOSAL | Status: DC | PRN

## 2020-05-16 MED ORDER — DILTIAZEM HCL ER COATED BEADS 240 MG PO CP24
240.0000 mg | ORAL_CAPSULE | Freq: Every day | ORAL | Status: DC
Start: 2020-05-17 — End: 2020-05-17
  Administered 2020-05-17: 240 mg via ORAL
  Filled 2020-05-16: qty 1

## 2020-05-16 MED ORDER — LORATADINE 10 MG PO TABS
10.0000 mg | ORAL_TABLET | Freq: Every day | ORAL | Status: DC
  Administered 2020-05-17: 10 mg via ORAL
  Filled 2020-05-16: qty 1

## 2020-05-16 MED ORDER — SCOPOLAMINE 1 MG/3DAYS TD PT72
MEDICATED_PATCH | TRANSDERMAL | Status: AC
  Filled 2020-05-16: qty 1

## 2020-05-16 MED ORDER — ADULT MULTIVITAMIN W/MINERALS CH
1.0000 | ORAL_TABLET | Freq: Every day | ORAL | Status: DC
  Administered 2020-05-17: 1 via ORAL
  Filled 2020-05-16 (×2): qty 1

## 2020-05-16 MED ORDER — DIPHENHYDRAMINE HCL 25 MG PO CAPS: 25.0000 mg | ORAL_CAPSULE | Freq: Four times a day (QID) | ORAL | Status: DC | PRN

## 2020-05-16 MED ORDER — METOCLOPRAMIDE HCL 5 MG/ML IJ SOLN: 5.0000 mg | Freq: Three times a day (TID) | INTRAMUSCULAR | Status: DC | PRN

## 2020-05-16 MED ORDER — ALPRAZOLAM 0.25 MG PO TABS: 0.2500 mg | ORAL_TABLET | Freq: Every day | ORAL | Status: DC | PRN

## 2020-05-16 MED ORDER — ALBUTEROL SULFATE HFA 108 (90 BASE) MCG/ACT IN AERS: 2.0000 | INHALATION_SPRAY | Freq: Four times a day (QID) | RESPIRATORY_TRACT | Status: DC | PRN

## 2020-05-16 MED ORDER — SUGAMMADEX SODIUM 200 MG/2ML IV SOLN
INTRAVENOUS | Status: DC | PRN
  Administered 2020-05-16: 200 mg via INTRAVENOUS

## 2020-05-16 MED ORDER — CHLORHEXIDINE GLUCONATE 0.12 % MT SOLN
15.0000 mL | Freq: Once | OROMUCOSAL | Status: AC
  Administered 2020-05-16: 15 mL via OROMUCOSAL

## 2020-05-16 MED ORDER — LOSARTAN POTASSIUM 50 MG PO TABS
50.0000 mg | ORAL_TABLET | Freq: Every day | ORAL | Status: DC
  Administered 2020-05-17: 50 mg via ORAL
  Filled 2020-05-16: qty 1

## 2020-05-16 MED ORDER — VITAMIN B-12 1000 MCG PO TABS
1000.0000 ug | ORAL_TABLET | Freq: Every day | ORAL | Status: DC
  Administered 2020-05-17: 1000 ug via ORAL
  Filled 2020-05-16 (×2): qty 1

## 2020-05-16 MED ORDER — LACTATED RINGERS IV SOLN: INTRAVENOUS | Status: DC

## 2020-05-16 MED ORDER — PHENYLEPHRINE HCL (PRESSORS) 10 MG/ML IV SOLN
INTRAVENOUS | Status: AC
  Filled 2020-05-16: qty 2

## 2020-05-16 MED ORDER — CEFAZOLIN SODIUM-DEXTROSE 2-4 GM/100ML-% IV SOLN
2.0000 g | Freq: Four times a day (QID) | INTRAVENOUS | Status: AC
  Administered 2020-05-16 – 2020-05-17 (×3): 2 g via INTRAVENOUS
  Filled 2020-05-16: qty 100

## 2020-05-16 MED ORDER — BISACODYL 10 MG RE SUPP
10.0000 mg | Freq: Every day | RECTAL | Status: DC | PRN
Start: 2020-05-16 — End: 2020-05-17

## 2020-05-16 MED ORDER — HYDROCODONE-ACETAMINOPHEN 5-325 MG PO TABS: 1.0000 | ORAL_TABLET | Freq: Four times a day (QID) | ORAL | 0 refills | Status: DC | PRN

## 2020-05-16 MED ORDER — SIMVASTATIN 20 MG PO TABS
20.0000 mg | ORAL_TABLET | Freq: Every day | ORAL | Status: DC
  Administered 2020-05-17: 20 mg via ORAL
  Filled 2020-05-16: qty 1

## 2020-05-16 MED ORDER — PHENYLEPHRINE 40 MCG/ML (10ML) SYRINGE FOR IV PUSH (FOR BLOOD PRESSURE SUPPORT)
PREFILLED_SYRINGE | INTRAVENOUS | Status: AC
  Filled 2020-05-16: qty 10

## 2020-05-16 MED ORDER — ONDANSETRON HCL 4 MG/2ML IJ SOLN
INTRAMUSCULAR | Status: AC
  Filled 2020-05-16: qty 2

## 2020-05-16 MED ORDER — METHOCARBAMOL 500 MG PO TABS: 500.0000 mg | ORAL_TABLET | Freq: Three times a day (TID) | ORAL | 1 refills | Status: DC | PRN

## 2020-05-16 MED ORDER — BUPIVACAINE HCL (PF) 0.5 % IJ SOLN
INTRAMUSCULAR | Status: DC | PRN
  Administered 2020-05-16: 30 mL via PERINEURAL

## 2020-05-16 MED ORDER — ROCURONIUM BROMIDE 10 MG/ML (PF) SYRINGE
PREFILLED_SYRINGE | INTRAVENOUS | Status: AC
  Filled 2020-05-16: qty 10

## 2020-05-16 MED ORDER — BUPIVACAINE-EPINEPHRINE (PF) 0.25% -1:200000 IJ SOLN
INTRAMUSCULAR | Status: AC
  Filled 2020-05-16: qty 30

## 2020-05-16 MED ORDER — SCOPOLAMINE 1 MG/3DAYS TD PT72
MEDICATED_PATCH | TRANSDERMAL | Status: DC | PRN
  Administered 2020-05-16: 1 via TRANSDERMAL

## 2020-05-16 MED ORDER — PHENYLEPHRINE 40 MCG/ML (10ML) SYRINGE FOR IV PUSH (FOR BLOOD PRESSURE SUPPORT)
PREFILLED_SYRINGE | INTRAVENOUS | Status: DC | PRN
  Administered 2020-05-16 (×3): 80 ug via INTRAVENOUS

## 2020-05-16 MED ORDER — MELATONIN 5 MG PO TABS
5.0000 mg | ORAL_TABLET | Freq: Every day | ORAL | Status: DC
  Administered 2020-05-16: 5 mg via ORAL
  Filled 2020-05-16: qty 1

## 2020-05-16 MED ORDER — DEXAMETHASONE SODIUM PHOSPHATE 10 MG/ML IJ SOLN
INTRAMUSCULAR | Status: AC
  Filled 2020-05-16: qty 1

## 2020-05-16 MED ORDER — MIDAZOLAM HCL 2 MG/2ML IJ SOLN
1.0000 mg | INTRAMUSCULAR | Status: DC
  Administered 2020-05-16: 2 mg via INTRAVENOUS
  Filled 2020-05-16: qty 2

## 2020-05-16 MED ORDER — PANTOPRAZOLE SODIUM 40 MG PO TBEC
40.0000 mg | DELAYED_RELEASE_TABLET | Freq: Every day | ORAL | Status: DC
  Administered 2020-05-17: 40 mg via ORAL
  Filled 2020-05-16: qty 1

## 2020-05-16 SURGICAL SUPPLY — 73 items
AID PSTN UNV HD RSTRNT DISP (MISCELLANEOUS) ×1
BAG SPEC THK2 15X12 ZIP CLS (MISCELLANEOUS)
BAG ZIPLOCK 12X15 (MISCELLANEOUS) IMPLANT
BIT DRILL 1.6MX128 (BIT) ×1 IMPLANT
BIT DRILL 170X2.5X (BIT) IMPLANT
BIT DRL 170X2.5X (BIT) ×1
BLADE SAG 18X100X1.27 (BLADE) ×2 IMPLANT
COVER BACK TABLE 60X90IN (DRAPES) ×2 IMPLANT
COVER SURGICAL LIGHT HANDLE (MISCELLANEOUS) ×2 IMPLANT
COVER WAND RF STERILE (DRAPES) IMPLANT
DECANTER SPIKE VIAL GLASS SM (MISCELLANEOUS) ×2 IMPLANT
DRAPE INCISE IOBAN 66X45 STRL (DRAPES) ×2 IMPLANT
DRAPE ORTHO SPLIT 77X108 STRL (DRAPES) ×4
DRAPE SHEET LG 3/4 BI-LAMINATE (DRAPES) ×2 IMPLANT
DRAPE SURG ORHT 6 SPLT 77X108 (DRAPES) ×2 IMPLANT
DRAPE TOP 10253 STERILE (DRAPES) ×2 IMPLANT
DRAPE U-SHAPE 47X51 STRL (DRAPES) ×2 IMPLANT
DRILL 2.5 (BIT) ×2
DRSG ADAPTIC 3X8 NADH LF (GAUZE/BANDAGES/DRESSINGS) ×2 IMPLANT
DRSG PAD ABDOMINAL 8X10 ST (GAUZE/BANDAGES/DRESSINGS) ×2 IMPLANT
DURAPREP 26ML APPLICATOR (WOUND CARE) ×2 IMPLANT
ELECT BLADE TIP CTD 4 INCH (ELECTRODE) ×2 IMPLANT
ELECT NDL TIP 2.8 STRL (NEEDLE) ×1 IMPLANT
ELECT NEEDLE TIP 2.8 STRL (NEEDLE) ×2 IMPLANT
ELECT REM PT RETURN 15FT ADLT (MISCELLANEOUS) ×2 IMPLANT
EPI LT SZ 1 (Orthopedic Implant) ×2 IMPLANT
EPIPHYSIS LT SZ 1 (Orthopedic Implant) IMPLANT
FACESHIELD WRAPAROUND (MASK) ×2 IMPLANT
FACESHIELD WRAPAROUND OR TEAM (MASK) ×1 IMPLANT
GAUZE SPONGE 4X4 12PLY STRL (GAUZE/BANDAGES/DRESSINGS) ×2 IMPLANT
GLENOSPHERE DELTA XTEND LAT 38 (Miscellaneous) ×1 IMPLANT
GLOVE BIOGEL PI ORTHO PRO 7.5 (GLOVE) ×1
GLOVE BIOGEL PI ORTHO PRO SZ8 (GLOVE) ×1
GLOVE ORTHO TXT STRL SZ7.5 (GLOVE) ×2 IMPLANT
GLOVE PI ORTHO PRO STRL 7.5 (GLOVE) ×1 IMPLANT
GLOVE PI ORTHO PRO STRL SZ8 (GLOVE) ×1 IMPLANT
GLOVE SURG ORTHO LTX SZ8.5 (GLOVE) ×2 IMPLANT
GOWN STRL REUS W/TWL XL LVL3 (GOWN DISPOSABLE) ×4 IMPLANT
KIT BASIN OR (CUSTOM PROCEDURE TRAY) ×2 IMPLANT
KIT TURNOVER KIT A (KITS) ×2 IMPLANT
MANIFOLD NEPTUNE II (INSTRUMENTS) ×2 IMPLANT
METAGLENE DELTA EXTEND (Trauma) IMPLANT
METAGLENE DXTEND (Trauma) ×2 IMPLANT
NDL MAYO CATGUT SZ4 TPR NDL (NEEDLE) IMPLANT
NEEDLE MAYO CATGUT SZ4 (NEEDLE) ×2 IMPLANT
NS IRRIG 1000ML POUR BTL (IV SOLUTION) ×2 IMPLANT
PACK SHOULDER (CUSTOM PROCEDURE TRAY) ×2 IMPLANT
PENCIL SMOKE EVACUATOR (MISCELLANEOUS) IMPLANT
PIN GUIDE 1.2 (PIN) ×1 IMPLANT
PIN GUIDE GLENOPHERE 1.5MX300M (PIN) ×1 IMPLANT
PIN METAGLENE 2.5 (PIN) ×1 IMPLANT
PROTECTOR NERVE ULNAR (MISCELLANEOUS) ×2 IMPLANT
RESTRAINT HEAD UNIVERSAL NS (MISCELLANEOUS) ×2 IMPLANT
SCREW 4.5X36MM (Screw) ×1 IMPLANT
SCREW LOCK DELTA XTEND 4.5X30 (Screw) ×1 IMPLANT
SLING ARM FOAM STRAP LRG (SOFTGOODS) ×1 IMPLANT
SMARTMIX MINI TOWER (MISCELLANEOUS)
SPACER 38 PLUS 3 (Spacer) ×1 IMPLANT
SPONGE LAP 4X18 RFD (DISPOSABLE) IMPLANT
STEM DELTA DIA 10 HA (Stem) ×1 IMPLANT
STRIP CLOSURE SKIN 1/2X4 (GAUZE/BANDAGES/DRESSINGS) ×2 IMPLANT
SUCTION FRAZIER HANDLE 10FR (MISCELLANEOUS) ×2
SUCTION TUBE FRAZIER 10FR DISP (MISCELLANEOUS) ×1 IMPLANT
SUT FIBERWIRE #2 38 T-5 BLUE (SUTURE) ×8
SUT MNCRL AB 4-0 PS2 18 (SUTURE) ×2 IMPLANT
SUT VIC AB 0 CT1 36 (SUTURE) ×4 IMPLANT
SUT VIC AB 0 CT2 27 (SUTURE) ×2 IMPLANT
SUT VIC AB 2-0 CT1 27 (SUTURE) ×2
SUT VIC AB 2-0 CT1 TAPERPNT 27 (SUTURE) ×1 IMPLANT
SUTURE FIBERWR #2 38 T-5 BLUE (SUTURE) ×2 IMPLANT
TAPE CLOTH SURG 6X10 WHT LF (GAUZE/BANDAGES/DRESSINGS) ×1 IMPLANT
TOWEL OR 17X26 10 PK STRL BLUE (TOWEL DISPOSABLE) ×2 IMPLANT
TOWER SMARTMIX MINI (MISCELLANEOUS) IMPLANT

## 2020-05-16 NOTE — Anesthesia Procedure Notes (Signed)
Anesthesia Regional Block: Interscalene brachial plexus block   Pre-Anesthetic Checklist: ,, timeout performed, Correct Patient, Correct Site, Correct Laterality, Correct Procedure, Correct Position, site marked, Risks and benefits discussed,  Surgical consent,  Pre-op evaluation,  At surgeon's request and post-op pain management  Laterality: Left  Prep: chloraprep       Needles:  Injection technique: Single-shot  Needle Type: Echogenic Stimulator Needle     Needle Length: 5cm  Needle Gauge: 22     Additional Needles:   Narrative:  Start time: 05/16/2020 10:28 AM End time: 05/16/2020 10:39 AM Injection made incrementally with aspirations every 5 mL.  Performed by: Personally  Anesthesiologist: Duane Boston, MD  Additional Notes: Functioning IV was confirmed and monitors applied.  A 41mm 22ga echogenic arrow stimulator was used. Sterile prep and drape,hand hygiene and sterile gloves were used.Ultrasound guidance: relevant anatomy identified, needle position confirmed, local anesthetic spread visualized around nerve(s)., vascular puncture avoided.  Image printed for medical record.  Negative aspiration and negative test dose prior to incremental administration of local anesthetic. The patient tolerated the procedure well.

## 2020-05-16 NOTE — Transfer of Care (Signed)
Immediate Anesthesia Transfer of Care Note  Patient: [name]  Procedure(s) Performed: REVERSE SHOULDER ARTHROPLASTY (Left Shoulder)  Patient Location: PACU  Anesthesia Type:General  Level of Consciousness: awake, alert , oriented and patient cooperative  Airway & Oxygen Therapy: Patient Spontanous Breathing and Patient connected to face mask oxygen  Post-op Assessment: Report given to RN, Post -op Vital signs reviewed and stable and Patient moving all extremities  Post vital signs: Reviewed and stable  Last Vitals:  Vitals Value Taken Time  BP    Temp    Pulse 79 05/16/20 1340  Resp 18 05/16/20 1340  SpO2 99 % 05/16/20 1340  Vitals shown include unvalidated device data.  Last Pain:  Vitals:   05/16/20 1052  TempSrc:   PainSc: 0-No pain      Patients Stated Pain Goal: (P) 3 (00/76/22 6333)  Complications: No complications documented.

## 2020-05-16 NOTE — Anesthesia Procedure Notes (Addendum)
Procedure Name: Intubation Date/Time: 05/16/2020 11:45 AM Performed by: Victoriano Lain, CRNA Pre-anesthesia Checklist: Patient identified, Emergency Drugs available, Suction available and Patient being monitored Patient Re-evaluated:Patient Re-evaluated prior to induction Oxygen Delivery Method: Circle system utilized Preoxygenation: Pre-oxygenation with 100% oxygen Induction Type: IV induction Ventilation: Mask ventilation without difficulty Laryngoscope Size: Mac and 4 Grade View: Grade I Tube type: Oral Tube size: 7.5 mm Number of attempts: 1 Airway Equipment and Method: Stylet Placement Confirmation: ETT inserted through vocal cords under direct vision,  positive ETCO2 and breath sounds checked- equal and bilateral Secured at: 24 cm Tube secured with: Tape Dental Injury: Teeth and Oropharynx as per pre-operative assessment

## 2020-05-16 NOTE — Anesthesia Preprocedure Evaluation (Addendum)
Anesthesia Evaluation  Patient identified by MRN, date of birth, ID band Patient awake    Reviewed: Allergy & Precautions, NPO status , Patient's Chart, lab work & pertinent test results  History of Anesthesia Complications (+) PONV and history of anesthetic complications  Airway Mallampati: III  TM Distance: >3 FB     Dental no notable dental hx. (+) Dental Advisory Given   Pulmonary shortness of breath and with exertion, asthma , sleep apnea ,  Not on CPAP, Hx/o borderline OSA   Pulmonary exam normal        Cardiovascular hypertension, Pt. on medications + angina Normal cardiovascular exam+ dysrhythmias Atrial Fibrillation   No angina since weight loss Hx/o PAF Loop recorder   Neuro/Psych PSYCHIATRIC DISORDERS Anxiety Depression negative neurological ROS     GI/Hepatic Neg liver ROS, GERD  ,S/P Lap gastric sleeve IBS   Endo/Other  Morbid obesityObesity Hyperlipidemia  Renal/GU negative Renal ROS  negative genitourinary   Musculoskeletal  (+) Arthritis , Osteoarthritis,  OA right shoulder, possible right rotator cuff tear   Abdominal (+) + obese,   Peds  Hematology  (+) anemia ,   Anesthesia Other Findings   Reproductive/Obstetrics                            Anesthesia Physical  Anesthesia Plan  ASA: III  Anesthesia Plan: General and Regional   Post-op Pain Management:  Regional for Post-op pain   Induction: Intravenous  PONV Risk Score and Plan: 4 or greater and Ondansetron, Dexamethasone, Midazolam, Treatment may vary due to age or medical condition and Scopolamine patch - Pre-op  Airway Management Planned: Oral ETT  Additional Equipment:   Intra-op Plan:   Post-operative Plan: Extubation in OR  Informed Consent: I have reviewed the patients History and Physical, chart, labs and discussed the procedure including the risks, benefits and alternatives for the proposed  anesthesia with the patient or authorized representative who has indicated his/her understanding and acceptance.     Dental advisory given  Plan Discussed with: Anesthesiologist and CRNA  Anesthesia Plan Comments: (Discussed allergy to Fentanyl and dilaudid.  She reports itching after oral use, no problems with iv use.  )       Anesthesia Quick Evaluation

## 2020-05-16 NOTE — Interval H&P Note (Signed)
History and Physical Interval Note:  05/16/2020 10:50 AM  [name]  has presented today for surgery, with the diagnosis of Left shoulder rotator cuff tear arthropathy.  The various methods of treatment have been discussed with the patient and family. After consideration of risks, benefits and other options for treatment, the patient has consented to  Procedure(s) with comments: REVERSE SHOULDER ARTHROPLASTY (Left) - interscalene exparel as a surgical intervention.  The patient's history has been reviewed, patient examined, no change in status, stable for surgery.  I have reviewed the patient's chart and labs.  Questions were answered to the patient's satisfaction.     Augustin Schooling

## 2020-05-16 NOTE — Plan of Care (Signed)

## 2020-05-16 NOTE — Discharge Instructions (Signed)
Ice to the shoulder constantly.  Keep the incision covered and clean and dry for one week, then ok to get it wet in the shower.  Do exercise as instructed several times per day.  DO NOT reach behind your back or push up out of a chair with the operative arm.  Use a sling while you are up and around for comfort, may remove while seated.  Keep pillow propped behind the operative elbow. Keep that arm around in front of you at all times.   Follow up with Dr Veverly Fells in two weeks in the office, call [phone] for appt

## 2020-05-16 NOTE — Progress Notes (Signed)
Assisted Dr. Singer with left, ultrasound guided, interscalene  block. Side rails up, monitors on throughout procedure. See vital signs in flow sheet. Tolerated Procedure well.  

## 2020-05-16 NOTE — Brief Op Note (Signed)
05/16/2020  1:52 PM  PATIENT:  [name]  75 y.o. female  PRE-OPERATIVE DIAGNOSIS:  Left shoulder rotator cuff tear arthropathy  POST-OPERATIVE DIAGNOSIS:  Left shoulder rotator cuff tear arthropathy  PROCEDURE:  Procedure(s) with comments: REVERSE SHOULDER ARTHROPLASTY (Left) - interscalene exparel DePuy Delta Xtend with SUBSCAP repair  SURGEON:  Surgeon(s) and Role:    Netta Cedars, MD - Primary  PHYSICIAN ASSISTANT:   ASSISTANTS: Ventura Bruns, PA-C   ANESTHESIA:   regional and general  EBL:  200 mL   BLOOD ADMINISTERED:none  DRAINS: none   LOCAL MEDICATIONS USED:  MARCAINE     SPECIMEN:  No Specimen  DISPOSITION OF SPECIMEN:  [name]  COUNTS:  YES  TOURNIQUET:  * No tourniquets in log *  DICTATION: .Other Dictation: Dictation Number [id number]  PLAN OF CARE: Admit for overnight observation  PATIENT DISPOSITION:  PACU - hemodynamically stable.   Delay start of Pharmacological VTE agent (>24hrs) due to surgical blood loss or risk of bleeding: not applicable

## 2020-05-16 NOTE — Anesthesia Postprocedure Evaluation (Signed)
Anesthesia Post Note  Patient: [name]  Procedure(s) Performed: REVERSE SHOULDER ARTHROPLASTY (Left Shoulder)     Patient location during evaluation: PACU Anesthesia Type: Regional and General Level of consciousness: awake and alert Pain management: pain level controlled Vital Signs Assessment: post-procedure vital signs reviewed and stable Respiratory status: spontaneous breathing, nonlabored ventilation, respiratory function stable and patient connected to nasal cannula oxygen Cardiovascular status: blood pressure returned to baseline and stable Postop Assessment: no apparent nausea or vomiting Anesthetic complications: no   No complications documented.  Last Vitals:  Vitals:   05/16/20 1500 05/16/20 1515  BP: (!) 142/83 (!) 154/86  Pulse: 75 75  Resp: 13 12  Temp:    SpO2: 99% 99%    Last Pain:  Vitals:   05/16/20 1515  TempSrc:   PainSc: 0-No pain                 Belenda Cruise P Stoltzfus

## 2020-05-17 DIAGNOSIS — M19012 Primary osteoarthritis, left shoulder: Secondary | ICD-10-CM | POA: Diagnosis not present

## 2020-05-17 DIAGNOSIS — J45909 Unspecified asthma, uncomplicated: Secondary | ICD-10-CM | POA: Diagnosis not present

## 2020-05-17 DIAGNOSIS — M75102 Unspecified rotator cuff tear or rupture of left shoulder, not specified as traumatic: Secondary | ICD-10-CM | POA: Diagnosis not present

## 2020-05-17 DIAGNOSIS — Z96661 Presence of right artificial ankle joint: Secondary | ICD-10-CM | POA: Diagnosis not present

## 2020-05-17 DIAGNOSIS — Z96611 Presence of right artificial shoulder joint: Secondary | ICD-10-CM | POA: Diagnosis not present

## 2020-05-17 DIAGNOSIS — I10 Essential (primary) hypertension: Secondary | ICD-10-CM | POA: Diagnosis not present

## 2020-05-17 LAB — BASIC METABOLIC PANEL
Anion gap: 9 (ref 5–15)
BUN: 21 mg/dL (ref 8–23)
CO2: 23 mmol/L (ref 22–32)
Calcium: 8.5 mg/dL — ABNORMAL LOW (ref 8.9–10.3)
Chloride: 107 mmol/L (ref 98–111)
Creatinine, Ser: 0.85 mg/dL (ref 0.44–1.00)
GFR, Estimated: >60 mL/min (ref >60)
Glucose, Bld: 157 mg/dL — ABNORMAL HIGH (ref 70–99)
Potassium: 4.6 mmol/L (ref 3.5–5.1)
Sodium: 139 mmol/L (ref 135–145)

## 2020-05-17 LAB — HEMOGLOBIN AND HEMATOCRIT, BLOOD
HCT: 32.9 % — ABNORMAL LOW (ref 36.0–46.0)
Hemoglobin: 10.2 g/dL — ABNORMAL LOW (ref 12.0–15.0)

## 2020-05-17 NOTE — Progress Notes (Signed)
   Subjective:  Patient reports pain as mild.  Doing well this morning with no specific complaints.  No chest pain or shortness of breath.  No overnight events.  Objective:   VITALS:   Vitals:   05/16/20 1947 05/16/20 2151 05/17/20 0120 05/17/20 0547  BP: 131/72 137/71 (!) 146/82 (!) 139/55  Pulse: 93 92 88 86  Resp: 18 16 16 16   Temp: 98.6 F (37 C) 97.7 F (36.5 C) 98 F (36.7 C) 98.4 F (36.9 C)  TempSrc: Oral Oral Oral Oral  SpO2: 94% 96% 96% 98%  Weight:      Height:        Neurologically intact ABD soft Intact pulses distally Incision: dressing C/D/I Sling in place. -Sensation intact in the median and ulnar nerve.  Decreased in radial consistent with interscalene block  Lab Results  Component Value Date   WBC 6.2 05/08/2020   HGB 10.2 (L) 05/17/2020   HCT 32.9 (L) 05/17/2020   MCV 92.2 05/08/2020   PLT 225 05/08/2020   BMET    Component Value Date/Time   NA 139 05/17/2020 0244   NA 145 (H) 05/09/2019 1057   K 4.6 05/17/2020 0244   CL 107 05/17/2020 0244   CO2 23 05/17/2020 0244   GLUCOSE 157 (H) 05/17/2020 0244   BUN 21 05/17/2020 0244   BUN 17 05/09/2019 1057   CREATININE 0.85 05/17/2020 0244   CALCIUM 8.5 (L) 05/17/2020 0244   GFRNONAA >60 05/17/2020 0244   GFRAA >60 10/15/2019 0430     Assessment/Plan: 1 Day Post-Op   Active Problems:   H/O total shoulder replacement, left   Advance diet Up with therapy -Sling at all times.  -Postop cautions per Dr. Veverly Fells  -DC home today after application of Aquacel dressing.   Nicholes Stairs 05/17/2020, 9:42 AM   Geralynn Rile, MD [phone]

## 2020-05-17 NOTE — Evaluation (Signed)
Occupational Therapy Evaluation Patient Details Name: [name] MRN: [id number] DOB: Sep 30, 1945 Today's Date: 05/17/2020    History of Present Illness Patient is a 75 year old woman s/p L REverse total shoulder arthroplasty. Pt has a PMH significant for: anxiety, arthritis, asthma, depression, diverticulosis, GERD, HTN, hyperlipidemia, hypertension, IBS, obesity, paroxysmal atrial fibrillation, and tachycardia. Surgical history significant for B anterior total hip arthroplasty, elbow surgery, laparoscopic gastric sleeve resection with hiatal hernia repair and upper endoscopy, R TKA, and L TKA, R reverse TSA.   Clinical Impression   [name] is a 75 year old woman s/p shoulder replacement without functional use of left dominant upper extremity secondary to effects of surgery and interscalene block and shoulder precautions. Therapist provided education and instruction to patient in regards to exercises, precautions, positioning, donning upper extremity clothing and bathing while maintaining shoulder precautions, ice and edema management and donning/doffing sling. Patient verbalized understanding and demonstrated as needed. Patient provided with handouts to maximize retention of education. Patient needed assistance to donn shirt, underwear, and pants and provided with instruction on compensatory strategies to perform ADLs. Patient to follow up with MD for further therapy needs.      Follow Up Recommendations  Follow surgeon's recommendation for DC plan and follow-up therapies    Equipment Recommendations  None recommended by OT    Recommendations for Other Services       Precautions / Restrictions Precautions Precautions: Shoulder Type of Shoulder Precautions: No AROM, No PROM Shoulder Interventions: Shoulder sling/immobilizer;At all times;Off for dressing/bathing/exercises Precaution Booklet Issued:  (handout) Restrictions Weight Bearing Restrictions: Yes LUE Weight  Bearing: Non weight bearing      Mobility Bed Mobility Overal bed mobility: Needs Assistance Bed Mobility: Supine to Sit     Supine to sit: Min assist     General bed mobility comments: hand hold to pull upon.    Transfers Overall transfer level: Needs assistance Equipment used: None Transfers: Sit to/from [facility] Sit to Stand: Min guard Stand pivot transfers: Min guard       General transfer comment: MIn guard for ambulation in room, history of vertigo and impaired balance and falls.    Balance Overall balance assessment: Mild deficits observed, not formally tested;History of Falls                                         ADL either performed or assessed with clinical judgement   ADL Overall ADL's : Needs assistance/impaired Eating/Feeding: Set up   Grooming: Modified independent   Upper Body Bathing: Modified independent   Lower Body Bathing: Modified independent   Upper Body Dressing : Moderate assistance;Sitting   Lower Body Dressing: Minimal assistance;Sit to/from stand Lower Body Dressing Details (indicate cue type and reason): to pull clothing over left hip. Toilet Transfer: Designer, fashion/clothing and Hygiene: Minimal assistance Toileting - Clothing Manipulation Details (indicate cue type and reason): for clothing manaement over left hip.             Vision Baseline Vision/History: Wears glasses Wears Glasses: At all times Patient Visual Report: No change from baseline       Perception     Praxis      Pertinent Vitals/Pain Pain Assessment: Faces Faces Pain Scale: Hurts a little bit Pain Location: Left shoulder Pain Intervention(s): Monitored during session     Hand Dominance  Extremity/Trunk Assessment Upper Extremity Assessment Upper Extremity Assessment: LUE deficits/detail LUE Deficits / Details: able to grossly open and close fingers; AROM still limited by effects of  interscalene block. LUE Sensation: decreased light touch   Lower Extremity Assessment Lower Extremity Assessment: Overall WFL for tasks assessed   Cervical / Trunk Assessment Cervical / Trunk Assessment: Normal   Communication     Cognition Arousal/Alertness: Awake/alert Behavior During Therapy: WFL for tasks assessed/performed Overall Cognitive Status: Within Functional Limits for tasks assessed                                     General Comments       Exercises     Shoulder Instructions Shoulder Instructions Donning/doffing shirt without moving shoulder: Patient able to independently direct caregiver Method for sponge bathing under operated UE: Modified independent Donning/doffing sling/immobilizer: Patient able to independently direct caregiver Correct positioning of sling/immobilizer: Independent ROM for elbow, wrist and digits of operated UE: Independent Sling wearing schedule (on at all times/off for ADL's): Independent Proper positioning of operated UE when showering: Independent Dressing change: Independent Positioning of UE while sleeping: Moody expects to be discharged to:: Private residence Living Arrangements: Spouse/significant other                                      Prior Functioning/Environment                   OT Problem List: Decreased strength;Decreased range of motion;Impaired balance (sitting and/or standing);Impaired UE functional use      OT Treatment/Interventions:      OT Goals(Current goals can be found in the care plan section) Acute Rehab OT Goals OT Goal Formulation: All assessment and education complete, DC therapy  OT Frequency:     Barriers to D/C:            Co-evaluation              AM-PAC OT "6 Clicks" Daily Activity     Outcome Measure Help from another person eating meals?: A Little Help from another person taking care of personal  grooming?: A Little Help from another person toileting, which includes using toliet, bedpan, or urinal?: A Little Help from another person bathing (including washing, rinsing, drying)?: None Help from another person to put on and taking off regular upper body clothing?: A Lot Help from another person to put on and taking off regular lower body clothing?: A Little 6 Click Score: 18   End of Session Nurse Communication:  (OT education complete)  Activity Tolerance: Patient tolerated treatment well Patient left: in chair;with call bell/phone within reach  OT Visit Diagnosis: Muscle weakness (generalized) (M62.81)                Time: 6659-9357 OT Time Calculation (min): 33 min Charges:  OT General Charges $OT Visit: 1 Visit OT Evaluation $OT Eval Low Complexity: 1 Low OT Treatments $Self Care/Home Management : 8-22 mins  Aarav Burgett, OTR/L Acute Care Rehab Services  Office [phone] Pager: [phone]   Lenward Chancellor 05/17/2020, 10:24 AM

## 2020-05-17 NOTE — Discharge Summary (Signed)
Patient ID: [name] MRN: [id number] DOB/AGE: [birth year] 75 y.o.  Admit date: 05/16/2020 Discharge date: 2.26.2022  Primary Diagnosis: Left shoulder osteoarthritis Admission Diagnoses:  Past Medical History:  Diagnosis Date  . Allergy   . Anemia   . Antral polyp    benign  . Anxiety   . Arthritis   . Asthma   . B12 deficiency   . Back pain   . Chest pain   . Constipation   . Depression   . Diverticulosis   . Gallbladder problem   . GERD (gastroesophageal reflux disease) 09/09/2011  . History of transfusion   . HTN (hypertension) 09/09/2011  . Hyperlipemia 09/09/2011  . Hypertension   . IBS (irritable bowel syndrome)   . Internal hemorrhoid   . Joint pain   . Obesity   . Osteoarthritis   . Paroxysmal atrial fibrillation (HCC)   . Pneumonia   . PONV (postoperative nausea and vomiting)    with big surgeries, none with endos etc, with colonoscopy- 5-6 years ago could not swallow   . Shortness of breath   . Sleep apnea    borderline per patient no cpap   . SOB (shortness of breath) on exertion   . Swallowing difficulty   . Tachycardia   . Unstable angina ([place]) 09/09/2011  . Vitamin D deficiency   . Vocal cord dysfunction 2013   swelling   Discharge Diagnoses:   Active Problems:   H/O total shoulder replacement, left  Estimated body mass index is 41.3 kg/m as calculated from the following:   Height as of this encounter: 5\' 4"  (1.626 m).   Weight as of this encounter: 109.1 kg.  Procedure:  Procedure(s) (LRB): REVERSE SHOULDER ARTHROPLASTY (Left)   Consults: None  HPI: [name] is a nice 75 year old female who was admitted for her left reverse shoulder arthroplasty. Laboratory Data: Admission on 05/16/2020  Component Date Value Ref Range Status  . Hemoglobin 05/17/2020 10.2* 12.0 - 15.0 g/dL Final  . HCT 05/17/2020 32.9* 36.0 - 46.0 % Final   Performed at [facility], [place] [address] [place], [place] [address]  . Sodium  05/17/2020 139  135 - 145 mmol/L Final  . Potassium 05/17/2020 4.6  3.5 - 5.1 mmol/L Final  . Chloride 05/17/2020 107  98 - 111 mmol/L Final  . CO2 05/17/2020 23  22 - 32 mmol/L Final  . Glucose, Bld 05/17/2020 157* 70 - 99 mg/dL Final   Glucose reference range applies only to samples taken after fasting for at least 8 hours.  . BUN 05/17/2020 21  8 - 23 mg/dL Final  . Creatinine, Ser 05/17/2020 0.85  0.44 - 1.00 mg/dL Final  . Calcium 05/17/2020 8.5* 8.9 - 10.3 mg/dL Final  . GFR, Estimated 05/17/2020 >60  >60 mL/min Final   Comment: (NOTE) Calculated using the CKD-EPI Creatinine Equation (2021)   . Anion gap 05/17/2020 9  5 - 15 Final   Performed at [facility], [place] [address], [place], [place] [address]  Hospital Outpatient Visit on 05/08/2020  Component Date Value Ref Range Status  . MRSA, PCR 05/08/2020 NEGATIVE  NEGATIVE Final  . Staphylococcus aureus 05/08/2020 NEGATIVE  NEGATIVE Final   Comment: (NOTE) The Xpert SA Assay (FDA approved for NASAL specimens in patients 26 years of age and older), is one component of a comprehensive surveillance program. It is not intended to diagnose infection nor to guide or monitor treatment. Performed at [facility], [place]  [address] [place], [place] [address]   . Sodium 05/08/2020 140  135 - 145 mmol/L Final  . Potassium 05/08/2020 4.4  3.5 - 5.1 mmol/L Final  . Chloride 05/08/2020 105  98 - 111 mmol/L Final  . CO2 05/08/2020 25  22 - 32 mmol/L Final  . Glucose, Bld 05/08/2020 98  70 - 99 mg/dL Final   Glucose reference range applies only to samples taken after fasting for at least 8 hours.  . BUN 05/08/2020 18  8 - 23 mg/dL Final  . Creatinine, Ser 05/08/2020 0.93  0.44 - 1.00 mg/dL Final  . Calcium 05/08/2020 9.3  8.9 - 10.3 mg/dL Final  . GFR, Estimated 05/08/2020 >60  >60 mL/min Final   Comment: (NOTE) Calculated using the CKD-EPI Creatinine Equation (2021)   . Anion gap 05/08/2020 10  5 - 15  Final   Performed at [facility], [place] [address], [place], [place] [address]  . WBC 05/08/2020 6.2  4.0 - 10.5 K/uL Final  . RBC 05/08/2020 4.23  3.87 - 5.11 MIL/uL Final  . Hemoglobin 05/08/2020 12.1  12.0 - 15.0 g/dL Final  . HCT 05/08/2020 39.0  36.0 - 46.0 % Final  . MCV 05/08/2020 92.2  80.0 - 100.0 fL Final  . MCH 05/08/2020 28.6  26.0 - 34.0 pg Final  . MCHC 05/08/2020 31.0  30.0 - 36.0 g/dL Final  . RDW 05/08/2020 13.4  11.5 - 15.5 % Final  . Platelets 05/08/2020 225  150 - 400 K/uL Final  . nRBC 05/08/2020 0.0  0.0 - 0.2 % Final   Performed at [facility], [place] [address], [place], [place] [address]  Clinical Support on 04/28/2020  Component Date Value Ref Range Status  . Date Time Interrogation Session 05/01/2020 [id number]   Final  . Pulse Generator Manufacturer 05/01/2020 MERM   Final  . Pulse Gen Model 05/01/2020 [id number] [id number] II   Final  . Pulse Gen Serial Number 05/01/2020 [id number]   Final  . Clinic Name 05/01/2020 [facility]   Final  . Implantable Pulse Generator Type 05/01/2020 ICM/ILR   Final  . Implantable Pulse Generator Implan* 05/01/2020 [id number]   Final  . Eval Rhythm 05/01/2020 SR at 90 bpm   Final  Clinical Support on 03/28/2020  Component Date Value Ref Range Status  . Date Time Interrogation Session 03/29/2020 [id number]   Final  . Pulse Generator Manufacturer 03/29/2020 MERM   Final  . Pulse Gen Model 03/29/2020 [id number] LINQ II   Final  . Pulse Gen Serial Number 03/29/2020 [id number]   Final  . Clinic Name 03/29/2020 [facility]   Final  . Implantable Pulse Generator Type 03/29/2020 ICM/ILR   Final  . Implantable Pulse Generator Implan* 03/29/2020 [id number]   Final     X-Rays:DG Shoulder Left Port  Result Date: 05/16/2020 CLINICAL DATA:  Shoulder arthroplasty EXAM: LEFT SHOULDER COMPARISON:  None. FINDINGS: Mild AC joint degenerative change. Patient is status post reverse left shoulder arthroplasty with  intact hardware and normal alignment. No fracture is seen IMPRESSION: Status post left shoulder arthroplasty with expected postsurgical changes. Electronically Signed   By: Donavan Foil M.D.   On: 05/16/2020 16:42   CUP PACEART REMOTE DEVICE CHECK  Result Date: 05/01/2020 ILR summary report received. Battery status OK. Normal device function. No new symptom, tachy, or brady episodes. No new AF episodes. There were three false pause events that were all undersensing.  Monthly summary reports and ROV/PRN Theresa Breach, RN, CCDS,  [employer]   EKG: Orders placed or performed during the hospital encounter of 01/09/20  . EKG 12-Lead  . EKG 12-Lead     Hospital Course: XOEY WARMOTH is a 75 y.o. who was admitted to Hospital. They were brought to the operating room on 05/16/2020 and underwent Procedure(s): [place].  Patient tolerated the procedure well and was later transferred to the recovery room and then to the orthopaedic floor for postoperative care.  They were given PO and IV analgesics for pain control following their surgery.  They were given 24 hours of postoperative antibiotics of  Anti-infectives (From admission, onward)   Start     Dose/Rate Route Frequency Ordered Stop   05/16/20 1700  ceFAZolin (ANCEF) IVPB 2g/100 mL premix        2 g 200 mL/hr over 30 Minutes Intravenous Every 6 hours 05/16/20 1603 05/17/20 0544   05/16/20 1000  clindamycin (CLEOCIN) IVPB 900 mg        900 mg 100 mL/hr over 30 Minutes Intravenous On call to O.R. 05/16/20 2919 05/16/20 1138     and started on DVT prophylaxis in the form of Aspirin.    OT  ordered for total joint protocol.  Discharge planning consulted to help with postop disposition and equipment needs.  Patient had a good night on the evening of surgery.   Patient was seen in rounds and was ready to go home.   Diet: Regular diet Activity:NWB Follow-up:in 2 weeks Disposition - Home Discharged Condition:  good   Discharge Instructions    Call MD / Call 911   Complete by: As directed    If you experience chest pain or shortness of breath, CALL 911 and be transported to the hospital emergency room.  If you develope a fever above 101 F, pus (white drainage) or increased drainage or redness at the wound, or calf pain, call your surgeon's office.   Call MD / Call 911   Complete by: As directed    If you experience chest pain or shortness of breath, CALL 911 and be transported to the hospital emergency room.  If you develope a fever above 101 F, pus (white drainage) or increased drainage or redness at the wound, or calf pain, call your surgeon's office.   Constipation Prevention   Complete by: As directed    Drink plenty of fluids.  Prune juice may be helpful.  You may use a stool softener, such as Colace (over the counter) 100 mg twice a day.  Use MiraLax (over the counter) for constipation as needed.   Constipation Prevention   Complete by: As directed    Drink plenty of fluids.  Prune juice may be helpful.  You may use a stool softener, such as Colace (over the counter) 100 mg twice a day.  Use MiraLax (over the counter) for constipation as needed.   Diet - low sodium heart healthy   Complete by: As directed    Diet - low sodium heart healthy   Complete by: As directed    Increase activity slowly as tolerated   Complete by: As directed    Increase activity slowly as tolerated   Complete by: As directed      Allergies as of 05/17/2020      Reactions   Ativan [lorazepam] Other (See Comments)   Agitation, aggressive actions, significant mental changes   Cefdinir Hives   headache   Buprenorphine Hcl Itching, Rash   Fentanyl Itching  Medication List    TAKE these medications   acetaminophen 500 MG tablet Commonly known as: TYLENOL Take 1,000 mg by mouth every 6 (six) hours as needed (for pain.).   albuterol 108 (90 Base) MCG/ACT inhaler Commonly known as: VENTOLIN HFA Inhale 2  puffs into the lungs every 6 (six) hours as needed for shortness of breath or wheezing.   ALPRAZolam 0.25 MG tablet Commonly known as: XANAX Take 0.25 mg by mouth daily as needed for anxiety.   calcium carbonate 500 MG chewable tablet Commonly known as: TUMS - dosed in mg elemental calcium Chew 1,500 mg by mouth daily as needed for indigestion or heartburn.   cetirizine 10 MG tablet Commonly known as: ZYRTEC Take 10 mg by mouth as needed for allergies.   COLACE PO Take 1 tablet by mouth 3 (three) times daily.   Dialyvite Vitamin D 5000 125 MCG (5000 UT) capsule Generic drug: Cholecalciferol Take 5,000 Units by mouth 2 (two) times daily.   dicyclomine 10 MG capsule Commonly known as: BENTYL Take 1 capsule (10 mg total) by mouth 2 (two) times daily as needed for spasms.   diltiazem 240 MG 24 hr capsule Commonly known as: CARDIZEM CD Take 1 capsule by mouth once daily   diphenhydrAMINE 25 MG tablet Commonly known as: BENADRYL Take 25 mg by mouth every 6 (six) hours as needed for allergies.   DULoxetine 60 MG capsule Commonly known as: CYMBALTA Take 60 mg by mouth daily.   esomeprazole 40 MG capsule Commonly known as: NEXIUM TAKE 1 CAPSULE BY MOUTH TWICE DAILY BEFORE A MEAL What changed: See the new instructions.   fluticasone 50 MCG/ACT nasal spray Commonly known as: FLONASE Place 1 spray into both nostrils at bedtime as needed for allergies or rhinitis.   HYDROcodone-acetaminophen 5-325 MG tablet Commonly known as: NORCO/VICODIN Take 1-2 tablets by mouth every 6 (six) hours as needed for moderate pain or severe pain. What changed:   how much to take  reasons to take this   losartan 50 MG tablet Commonly known as: COZAAR Take 50 mg by mouth daily.   Melatonin 5 MG Caps Take 5 mg by mouth at bedtime.   methocarbamol 500 MG tablet Commonly known as: ROBAXIN Take 1 tablet (500 mg total) by mouth every 8 (eight) hours as needed for muscle spasms. What  changed:   when to take this  reasons to take this   multivitamin with minerals Tabs tablet Take 1 tablet by mouth daily. Centrum   ondansetron 8 MG disintegrating tablet Commonly known as: ZOFRAN-ODT DISSOLVE 1 TABLET IN MOUTH EVERY 8 HOURS AS NEEDED FOR NAUSEA AND VOMITING -  TAKE  AT  LEAST  ONCE  PER  DAY What changed: See the new instructions.   simvastatin 20 MG tablet Commonly known as: ZOCOR Take 1 tablet (20 mg total) by mouth daily.   traMADol 50 MG tablet Commonly known as: ULTRAM Take 50 mg by mouth every 6 (six) hours as needed for pain.   vitamin B-12 1000 MCG tablet Commonly known as: CYANOCOBALAMIN Take 1,000 mcg by mouth daily.       Follow-up Information    Netta Cedars, MD. Call in 2 weeks.   Specialty: Orthopedic Surgery Why: [phone] Contact information: [address] [place] [address] [phone]               Signed: Geralynn Rile, MD Orthopaedic Surgery 05/17/2020, 9:56 AM

## 2020-05-17 NOTE — Op Note (Signed)
[name], [name] MEDICAL RECORD NO: [id number] ACCOUNT NO: [id number] DATE OF BIRTH: 01-02-1946 FACILITY: [facility] PHYSICIAN: Doran Heater. Veverly Fells, MD  Operative Report   DATE OF PROCEDURE: 05/16/2020   PREOPERATIVE DIAGNOSIS:  Left shoulder end-stage arthritis, rotator cuff tear arthropathy.  POSTOPERATIVE DIAGNOSIS:  Left shoulder end-stage arthritis, rotator cuff tear arthropathy.  PROCEDURE PERFORMED:  Left reverse shoulder replacement using DePuy Delta Xtend prosthesis with subscap repair.  ATTENDING SURGEON:  Dr. Earlie Green.  ASSISTANT:  Darol Destine, [place], who was scrubbed during the entire procedure, and necessary for satisfactory completion of surgery.   General anesthesia was used plus interscalene block.  ESTIMATED BLOOD LOSS:  200 mL  FLUID REPLACEMENT:  1500 mL crystalloid.   Instrument Green were correct.  No complications.    Perioperative antibiotics were given.  INDICATIONS:  The patient is a 75 year old female with a history of worsening left shoulder pain and dysfunction secondary to end-stage arthritis.  The patient has rotator cuff insufficiency with poor function.  We discussed options, recommending reverse  shoulder replacement.  The patient agreed with this plan.  Informed consent obtained.  DESCRIPTION OF PROCEDURE:  After an adequate level of anesthesia was achieved, the patient was positioned in modified beach chair position.  Left shoulder was correctly identified and sterilely prepped and draped in the usual manner.  Timeout called,  verifying correct patient, correct site.  We entered the shoulder using a standard deltopectoral approach starting to the coracoid process extending down to the anterior humerus.  Dissection down through subcutaneous tissues using Bovie electrocautery.   We identified the cephalic vein, took that laterally with the deltoid.  Pectoralis was taken medially.  Conjoined tendon identified and retracted  medially.  Biceps tenodesed in situ with 0 Vicryl figure-of-eight suture x2.  We then released the  subscapularis subperiosteally off the lesser tuberosity and placed a #2 FiberWire suture in a modified Mason-Allen suture technique and the free edge of the tendon for repair.  We did an inferior capsular release.  We then delivered the humeral head out  of the wound.  We released the supraspinatus and infraspinatus tendons preserving posterior infraspinatus and teres minor.  We entered the proximal humerus, which was devoid of cartilage with a 6 mm reamer, we reamed to a size 10 and then we placed a 10  mm intramedullary guide in the humeral shaft and resected the head at 10 degrees of retroversion with the oscillating saw, removed excess osteophytes with a rongeur.  We then went ahead and subluxed the humerus posteriorly.  We resected the remnant of  the supraspinatus and infraspinatus.  We then placed our deep retractors and did a capsular removal as well as glenoid labrum removal.  Once our deep retractors were placed and we could adequately visualize the glenoid face, we removed the remaining  cartilage on the glenoid side and there was none on the humeral side, but there was a little bit left on the glenoid side.  We removed out the Cobb elevator.  I placed a guide pin for the metaglene reaming.  Once we had the guide pin in good position, we  reamed for the metaglene baseplate.  Once we did that, we did peripheral hand reaming over the guidewire and then drilled out our central peg hole.  We impacted the DePuy metaglene into position.  We then placed 30 screw inferiorly, a 36 screw  superiorly.  We were only able to get 2 screws due to the small AP  dimension.  With those two screws in place, we had good baseplate security.  We then took a 38 standard glenosphere and secured that onto the baseplate.  Next, we went ahead and finished  our preparation of the humeral side reaming for the one left humerus  and then placing the 10 stem with a 1 left humerus metaphysis set on the 0 setting and placed in 10 degrees of retroversion.  With that in position, we reduced the shoulder with a 38+3  poly and had good security with that, we ranged the shoulder, we had no gapping with inferior pole or external rotation.  We then removed the trial components.  We drilled holes in the lesser tuberosity, placed #2 FiberWire suture for repair of the  subscap.  Next, we went ahead and used available bone graft from the humeral head in impaction grafting technique and impacted the HA coated press-fit stem, a size 10 and a 1 left metaphysis set on the 0 setting.  With that in good position, we selected  the real 38+3 poly impacted on the humeral tray.  We reduced the shoulder with a nice little pop and then anatomically repaired the subscap back to bone.  We were pleased with that repair did not restrict motion at all.  We had a nice stable shoulder.   We irrigated thoroughly and then repaired the deltopectoral interval with 0 Vicryl suture followed by 2-0 Vicryl for subcutaneous closure and 4-0 Monocryl for skin.  Steri-Strips were applied followed by a sterile dressing.  The patient tolerated surgery  well.   SUJ D: 05/16/2020 1:59:04 pm T: 05/17/2020 12:36:00 am  JOB: [id number]/ [id number]

## 2020-05-19 ENCOUNTER — Encounter (HOSPITAL_COMMUNITY): Payer: Self-pay | Admitting: Orthopedic Surgery

## 2020-05-26 DIAGNOSIS — J45909 Unspecified asthma, uncomplicated: Secondary | ICD-10-CM | POA: Diagnosis not present

## 2020-05-26 DIAGNOSIS — J309 Allergic rhinitis, unspecified: Secondary | ICD-10-CM | POA: Diagnosis not present

## 2020-05-26 DIAGNOSIS — J40 Bronchitis, not specified as acute or chronic: Secondary | ICD-10-CM | POA: Diagnosis not present

## 2020-05-26 DIAGNOSIS — I480 Paroxysmal atrial fibrillation: Secondary | ICD-10-CM | POA: Diagnosis not present

## 2020-05-29 ENCOUNTER — Ambulatory Visit (INDEPENDENT_AMBULATORY_CARE_PROVIDER_SITE_OTHER): Payer: Medicare Other

## 2020-05-29 DIAGNOSIS — I480 Paroxysmal atrial fibrillation: Secondary | ICD-10-CM

## 2020-05-29 DIAGNOSIS — Z1152 Encounter for screening for COVID-19: Secondary | ICD-10-CM | POA: Diagnosis not present

## 2020-06-03 DIAGNOSIS — Z4789 Encounter for other orthopedic aftercare: Secondary | ICD-10-CM | POA: Diagnosis not present

## 2020-06-04 LAB — CUP PACEART REMOTE DEVICE CHECK
Date Time Interrogation Session: 20220315150415
Implantable Pulse Generator Implant Date: 20211103

## 2020-06-06 NOTE — Progress Notes (Signed)
Carelink Summary Report / Loop Recorder 

## 2020-06-09 DIAGNOSIS — J45909 Unspecified asthma, uncomplicated: Secondary | ICD-10-CM | POA: Diagnosis not present

## 2020-06-09 DIAGNOSIS — J309 Allergic rhinitis, unspecified: Secondary | ICD-10-CM | POA: Diagnosis not present

## 2020-06-17 DIAGNOSIS — J383 Other diseases of vocal cords: Secondary | ICD-10-CM | POA: Diagnosis not present

## 2020-06-17 DIAGNOSIS — R0600 Dyspnea, unspecified: Secondary | ICD-10-CM | POA: Diagnosis not present

## 2020-06-17 DIAGNOSIS — J019 Acute sinusitis, unspecified: Secondary | ICD-10-CM | POA: Diagnosis not present

## 2020-06-30 ENCOUNTER — Ambulatory Visit (INDEPENDENT_AMBULATORY_CARE_PROVIDER_SITE_OTHER): Payer: Medicare Other

## 2020-06-30 DIAGNOSIS — I480 Paroxysmal atrial fibrillation: Secondary | ICD-10-CM | POA: Diagnosis not present

## 2020-07-01 ENCOUNTER — Other Ambulatory Visit: Payer: Self-pay | Admitting: Gastroenterology

## 2020-07-07 LAB — CUP PACEART REMOTE DEVICE CHECK
Date Time Interrogation Session: 20220417150620
Implantable Pulse Generator Implant Date: 20211103

## 2020-07-08 DIAGNOSIS — Z4789 Encounter for other orthopedic aftercare: Secondary | ICD-10-CM | POA: Diagnosis not present

## 2020-07-15 NOTE — Progress Notes (Signed)
Carelink Summary Report / Loop Recorder 

## 2020-07-21 ENCOUNTER — Other Ambulatory Visit: Payer: Self-pay | Admitting: Cardiovascular Disease

## 2020-07-30 ENCOUNTER — Other Ambulatory Visit: Payer: Self-pay

## 2020-07-30 ENCOUNTER — Encounter (HOSPITAL_COMMUNITY): Payer: Self-pay | Admitting: Nurse Practitioner

## 2020-07-30 ENCOUNTER — Ambulatory Visit (HOSPITAL_COMMUNITY)
Admission: RE | Admit: 2020-07-30 | Discharge: 2020-07-30 | Disposition: A | Discharge status: Home / Self Care | Payer: Medicare Other | Source: Ambulatory Visit | Attending: Nurse Practitioner | Admitting: Nurse Practitioner

## 2020-07-30 VITALS — BP 116/82 | HR 94 | Ht 64.0 in | Wt 236.8 lb

## 2020-07-30 DIAGNOSIS — E669 Obesity, unspecified: Secondary | ICD-10-CM | POA: Diagnosis not present

## 2020-07-30 DIAGNOSIS — Z79899 Other long term (current) drug therapy: Secondary | ICD-10-CM | POA: Diagnosis not present

## 2020-07-30 DIAGNOSIS — I480 Paroxysmal atrial fibrillation: Secondary | ICD-10-CM

## 2020-07-30 DIAGNOSIS — I444 Left anterior fascicular block: Secondary | ICD-10-CM | POA: Insufficient documentation

## 2020-07-30 DIAGNOSIS — I4891 Unspecified atrial fibrillation: Secondary | ICD-10-CM | POA: Insufficient documentation

## 2020-07-30 DIAGNOSIS — E785 Hyperlipidemia, unspecified: Secondary | ICD-10-CM | POA: Diagnosis not present

## 2020-07-30 DIAGNOSIS — I10 Essential (primary) hypertension: Secondary | ICD-10-CM | POA: Insufficient documentation

## 2020-07-30 DIAGNOSIS — Z8616 Personal history of COVID-19: Secondary | ICD-10-CM | POA: Insufficient documentation

## 2020-07-30 DIAGNOSIS — I517 Cardiomegaly: Secondary | ICD-10-CM | POA: Insufficient documentation

## 2020-07-30 NOTE — Progress Notes (Signed)
Primary Care Physician: Ginger Organ., MD Referring Physician: Dr. Daralene Milch is a 75 y.o. female with a h/o previous afib, off anticoagulation, and being monitored by LInq, implanted by Dr. Rayann Heman. She has not had any identified afib. She reports that she had Covid in January, shoulder surgery in February and then developed Pneumonia. She has struggled to reagin her stamina since then and feels her vocal chord dysphagia, dx many years ago, is worse.   Today, she denies symptoms of palpitations, chest pain, shortness of breath, orthopnea, PND, lower extremity edema, dizziness, presyncope, syncope, or neurologic sequela. The patient is tolerating medications without difficulties and is otherwise without complaint today.   Past Medical History:  Diagnosis Date  . Allergy   . Anemia   . Antral polyp    benign  . Anxiety   . Arthritis   . Asthma   . B12 deficiency   . Back pain   . Chest pain   . Constipation   . Depression   . Diverticulosis   . Gallbladder problem   . GERD (gastroesophageal reflux disease) 09/09/2011  . History of transfusion   . HTN (hypertension) 09/09/2011  . Hyperlipemia 09/09/2011  . Hypertension   . IBS (irritable bowel syndrome)   . Internal hemorrhoid   . Joint pain   . Obesity   . Osteoarthritis   . Paroxysmal atrial fibrillation (HCC)   . Pneumonia   . PONV (postoperative nausea and vomiting)    with big surgeries, none with endos etc, with colonoscopy- 5-6 years ago could not swallow   . Shortness of breath   . Sleep apnea    borderline per patient no cpap   . SOB (shortness of breath) on exertion   . Swallowing difficulty   . Tachycardia   . Unstable angina ([place]) 09/09/2011  . Vitamin D deficiency   . Vocal cord dysfunction 2013   swelling   Past Surgical History:  Procedure Laterality Date  . ABDOMINAL HYSTERECTOMY  1988  . ANKLE FUSION Right    x 2  . BACK SURGERY  [phone]   3 ruptured discs, lower back   . BILATERAL ANTERIOR TOTAL HIP ARTHROPLASTY Bilateral 11/12/2014   Procedure: BILATERAL ANTERIOR TOTAL HIP ARTHROPLASTY;  Surgeon: Paralee Cancel, MD;  [facility];  Service: Orthopedics;  Laterality: Bilateral;  . BRAVO [place] STUDY [name] 03/06/2013   Procedure: BRAVO [place] STUDY;  Surgeon: Inda Castle, MD;  Location: [facility];  Service: Endoscopy;  Laterality: [name];  . CARDIAC CATHETERIZATION  2013   NORMAL  . CHOLECYSTECTOMY    . COLONOSCOPY    . ELBOW SURGERY Left    removed bone chip  . ESOPHAGOGASTRODUODENOSCOPY [name] 03/06/2013   Procedure: ESOPHAGOGASTRODUODENOSCOPY (EGD);  Surgeon: Inda Castle, MD;  Location: [facility];  Service: Endoscopy;  Laterality: [name];  . FOOT OSTEOTOMY W/ PLANTAR FASCIA RELEASE Left   . ILIOTIBIAL BAND RELEASE Right 1997  . implantable loop recorder placement  01/23/2020   Medtronic Reveal Linq model LNQ 22 (RLB [id number]) implantable loop recorder  . JOINT REPLACEMENT    . LAPAROSCOPIC GASTRIC SLEEVE RESECTION WITH HIATAL HERNIA REPAIR  08/20/2014   Procedure: LAPAROSCOPIC GASTRIC SLEEVE RESECTION WITH HIATAL HERNIA  REPAIR AND  UPPER ENDOSCOPY;  Surgeon: Johnathan Hausen, MD;  [facility];  Service: General;;  . LEFT HEART CATHETERIZATION WITH CORONARY ANGIOGRAM Bilateral 09/10/2011   Procedure: LEFT HEART CATHETERIZATION WITH CORONARY ANGIOGRAM;  Surgeon: Lorretta Harp, MD;  Location: [facility];  Service: Cardiovascular;  Laterality: Bilateral;  . NASAL SINUS SURGERY  1997  . REVERSE SHOULDER ARTHROPLASTY Right 05/14/2016   Procedure: RIGHT REVERSE SHOULDER ARTHROPLASTY;  Surgeon: Netta Cedars, MD;  Location: [facility];  Service: Orthopedics;  Laterality: Right;  . REVERSE SHOULDER ARTHROPLASTY Left 05/16/2020   Procedure: REVERSE SHOULDER ARTHROPLASTY;  Surgeon: Netta Cedars, MD;  [facility];  Service: Orthopedics;  Laterality: Left;  interscalene exparel  . SHOULDER SURGERY Left   . tfc wrist Left 1997  . thumb replacement Bilateral     one 2009 and one 2014  . TOTAL ANKLE REPLACEMENT Right    x 3  . TOTAL ANKLE REPLACEMENT Left   . TOTAL ANKLE REPLACEMENT    . TOTAL HIP ARTHROPLASTY Bilateral   . TOTAL KNEE ARTHROPLASTY Bilateral    left 2002, right 2012  . TOTAL SHOULDER ARTHROPLASTY Right 05/14/2016  . TRANSTHORACIC ECHOCARDIOGRAM  09/09/2011   MILD CONCENTRIC LVH. TRACE MITRAL REGURG. TRACE TR. MILD AORTIC REGURG.  . 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 UPPER GI ENDOSCOPY  08/20/2014   Procedure: UPPER GI ENDOSCOPY;  Surgeon: Johnathan Hausen, MD;  [facility];  Service: General;;  . WRIST SURGERY Left 1997, 2009     Current Outpatient Medications  Medication Sig Dispense Refill  . acetaminophen (TYLENOL) 500 MG tablet Take 1,000 mg by mouth every 6 (six) hours as needed (for pain.).     [name] albuterol (PROVENTIL HFA;VENTOLIN HFA) 108 (90 Base) MCG/ACT inhaler Inhale 2 puffs into the lungs every 6 (six) hours as needed for shortness of breath or wheezing.    [name] ALPRAZolam (XANAX) 0.25 MG tablet Take 0.25 mg by mouth daily as needed for anxiety.    . calcium carbonate (TUMS - DOSED IN MG ELEMENTAL CALCIUM) 500 MG chewable tablet Chew 1,500 mg by mouth daily as needed for indigestion or heartburn.    . cetirizine (ZYRTEC) 10 MG tablet Take 10 mg by mouth as needed for allergies.    . Cholecalciferol (DIALYVITE VITAMIN D 5000) 125 MCG (5000 UT) capsule Take 5,000 Units by mouth 2 (two) times daily.    [name] dicyclomine (BENTYL) 10 MG capsule TAKE 1 CAPSULE BY MOUTH TWICE DAILY AS NEEDED FOR  SPASMS 60 capsule 0  . diltiazem (CARDIZEM CD) 240 MG 24 hr capsule Take 1 capsule by mouth once daily 90 capsule 0  . diphenhydrAMINE (BENADRYL) 25 MG tablet Take 25 mg by mouth every 6 (six) hours as needed for allergies.    Mariane Baumgarten Sodium (COLACE PO) Take 1 tablet by mouth 3 (three) times daily.    . DULoxetine (CYMBALTA) 60 MG capsule Take 60 mg by mouth daily.    [name] esomeprazole (NEXIUM) 40 MG capsule TAKE 1 CAPSULE BY  MOUTH TWICE DAILY BEFORE A MEAL 60 capsule 5  . fluticasone (FLONASE) 50 MCG/ACT nasal spray Place 1 spray into both nostrils at bedtime as needed for allergies or rhinitis.    [name] losartan (COZAAR) 50 MG tablet Take 50 mg by mouth daily.    . Melatonin 5 MG CAPS Take 5 mg by mouth at bedtime.    . methocarbamol (ROBAXIN) 500 MG tablet Take 1 tablet (500 mg total) by mouth every 8 (eight) hours as needed for muscle spasms. 40 tablet 1  . Multiple Vitamin (MULTIVITAMIN WITH MINERALS) TABS tablet Take 1 tablet by mouth daily. Centrum    . ondansetron (ZOFRAN-ODT) 8 MG disintegrating tablet Take 1 tablet (  8 mg total) by mouth See admin instructions. Take 8 mg in the morning, may take an additional 8 mg as needed for nausea and vomiting 90 tablet 0  . simvastatin (ZOCOR) 20 MG tablet Take 1 tablet (20 mg total) by mouth daily. 30 tablet 11  . traMADol (ULTRAM) 50 MG tablet Take 50 mg by mouth every 6 (six) hours as needed for pain.    . vitamin B-12 (CYANOCOBALAMIN) 1000 MCG tablet Take 1,000 mcg by mouth daily.     No current facility-administered medications for this encounter.    Allergies  Allergen Reactions  . Ativan [Lorazepam] Other (See Comments)    Agitation, aggressive actions, significant mental changes  . Cefdinir Hives    headache  . Buprenorphine Hcl Itching and Rash    Social History   Socioeconomic History  . Marital status: Married    Spouse name: Jenny Reichmann  . Number of children: 2  . Years of education: Not on file  . Highest education level: Not on file  Occupational History  . Occupation: retired    Fish farm manager: RETIRED  Tobacco Use  . Smoking status: Never Smoker  . Smokeless tobacco: Never Used  Vaping Use  . Vaping Use: Never used  Substance and Sexual Activity  . Alcohol use: No  . Drug use: No  . Sexual activity: Yes    Partners: Male  Other Topics Concern  . Not on file  Social History Narrative  . Not on file   Social Determinants of Health   Financial  Resource Strain: Not on file  Food Insecurity: Not on file  Transportation Needs: Not on file  Physical Activity: Not on file  Stress: Not on file  Social Connections: Not on file  Intimate Partner Violence: Not on file    Family History  Problem Relation Age of Onset  . Hypertension Mother   . Diabetes Mother   . Arthritis Mother   . Esophageal cancer Mother   . Cancer Mother   . Esophageal cancer Father 73  . Cancer Father   . Alcoholism Father   . Other Sister        kidney mass removed  . Hypertension Sister   . Stroke Sister        died in Dec 15, 2018  . Heart disease Maternal Grandmother   . Stroke Paternal Grandmother   . Coronary artery disease Neg Hx   . Colon cancer Neg Hx   . Liver disease Neg Hx   . Kidney disease Neg Hx   . Rectal cancer Neg Hx   . Stomach cancer Neg Hx     ROS- All systems are reviewed and negative except as per the HPI above  Physical Exam: Vitals:   07/30/20 1128  BP: 116/82  Pulse: 94  Weight: 107.4 kg  Height: 5\' 4"  (1.626 m)   Wt Readings from Last 3 Encounters:  07/30/20 107.4 kg  05/16/20 109.1 kg  05/08/20 109.1 kg    Labs: Lab Results  Component Value Date   NA 139 05/17/2020   K 4.6 05/17/2020   CL 107 05/17/2020   CO2 23 05/17/2020   GLUCOSE 157 (H) 05/17/2020   BUN 21 05/17/2020   CREATININE 0.85 05/17/2020   CALCIUM 8.5 (L) 05/17/2020   MG 1.9 10/13/2014   Lab Results  Component Value Date   INR 1.1 10/14/2019   Lab Results  Component Value Date   CHOL 190 11/17/2017   HDL 95 11/17/2017   LDLCALC 83  11/17/2017   TRIG 60 11/17/2017     GEN- The patient is well appearing, alert and oriented x 3 today.   Head- normocephalic, atraumatic Eyes-  Sclera clear, conjunctiva pink Ears- hearing intact Oropharynx- clear Neck- supple, no JVP Lymph- no cervical lymphadenopathy Lungs- Clear to ausculation bilaterally, normal work of breathing Heart- Regular rate and rhythm, no murmurs, rubs or gallops,  PMI not laterally displaced GI- soft, NT, ND, + BS Extremities- no clubbing, cyanosis, or edema MS- no significant deformity or atrophy Skin- no rash or lesion Psych- euthymic mood, full affect Neuro- strength and sensation are intact  EKG-NSR at 94 bpm, pr int 140 ms, qrs int 94 ms, qtc 470 ms   [facility] reports reviewed   Assessment and Plan: 1.Remote  Afib Currently  off anticoagulation and being followed by [facility] without any identified afib  Continue  remote checks    2, Prior Covid in January  Pneumonia in February H/o vocal cord dysphagia and feels that she is still having shortness of breath and more hoarseness  F/u with PCP   F/u with Dr. Rayann Heman  one year from his  last appointment   Tyrone. Mykhia Danish, [facility] [address] [place], [place] [address] [phone]

## 2020-08-07 ENCOUNTER — Other Ambulatory Visit (HOSPITAL_COMMUNITY): Payer: Self-pay | Admitting: Adult Health

## 2020-08-07 ENCOUNTER — Other Ambulatory Visit: Payer: Self-pay | Admitting: Internal Medicine

## 2020-08-07 ENCOUNTER — Other Ambulatory Visit (HOSPITAL_COMMUNITY): Payer: Self-pay | Admitting: Internal Medicine

## 2020-08-07 ENCOUNTER — Other Ambulatory Visit: Payer: Self-pay

## 2020-08-07 ENCOUNTER — Ambulatory Visit (HOSPITAL_BASED_OUTPATIENT_CLINIC_OR_DEPARTMENT_OTHER)
Admission: RE | Admit: 2020-08-07 | Discharge: 2020-08-07 | Disposition: A | Discharge status: Home / Self Care | Payer: Medicare Other | Source: Ambulatory Visit | Attending: Internal Medicine | Admitting: Internal Medicine

## 2020-08-07 DIAGNOSIS — R1031 Right lower quadrant pain: Secondary | ICD-10-CM | POA: Insufficient documentation

## 2020-08-07 DIAGNOSIS — R1011 Right upper quadrant pain: Secondary | ICD-10-CM | POA: Diagnosis not present

## 2020-08-07 DIAGNOSIS — Z87442 Personal history of urinary calculi: Secondary | ICD-10-CM | POA: Diagnosis not present

## 2020-08-07 DIAGNOSIS — R82998 Other abnormal findings in urine: Secondary | ICD-10-CM | POA: Diagnosis not present

## 2020-08-07 DIAGNOSIS — I10 Essential (primary) hypertension: Secondary | ICD-10-CM | POA: Diagnosis not present

## 2020-08-07 DIAGNOSIS — N390 Urinary tract infection, site not specified: Secondary | ICD-10-CM | POA: Diagnosis not present

## 2020-08-12 ENCOUNTER — Ambulatory Visit (INDEPENDENT_AMBULATORY_CARE_PROVIDER_SITE_OTHER): Payer: Medicare Other

## 2020-08-12 DIAGNOSIS — I480 Paroxysmal atrial fibrillation: Secondary | ICD-10-CM | POA: Diagnosis not present

## 2020-08-12 DIAGNOSIS — Z4789 Encounter for other orthopedic aftercare: Secondary | ICD-10-CM | POA: Diagnosis not present

## 2020-08-14 LAB — CUP PACEART REMOTE DEVICE CHECK
Date Time Interrogation Session: 20220525033827
Implantable Pulse Generator Implant Date: 20211103

## 2020-08-23 ENCOUNTER — Other Ambulatory Visit: Payer: Self-pay | Admitting: Gastroenterology

## 2020-08-25 DIAGNOSIS — M85871 Other specified disorders of bone density and structure, right ankle and foot: Secondary | ICD-10-CM | POA: Diagnosis not present

## 2020-08-25 DIAGNOSIS — M19071 Primary osteoarthritis, right ankle and foot: Secondary | ICD-10-CM | POA: Diagnosis not present

## 2020-08-25 DIAGNOSIS — Z96661 Presence of right artificial ankle joint: Secondary | ICD-10-CM | POA: Diagnosis not present

## 2020-08-26 ENCOUNTER — Other Ambulatory Visit: Payer: Self-pay | Admitting: Gastroenterology

## 2020-09-03 NOTE — Progress Notes (Signed)
Carelink Summary Report / Loop Recorder 

## 2020-09-09 DIAGNOSIS — R109 Unspecified abdominal pain: Secondary | ICD-10-CM | POA: Diagnosis not present

## 2020-09-09 DIAGNOSIS — R1013 Epigastric pain: Secondary | ICD-10-CM | POA: Diagnosis not present

## 2020-09-09 DIAGNOSIS — I10 Essential (primary) hypertension: Secondary | ICD-10-CM | POA: Diagnosis not present

## 2020-09-09 DIAGNOSIS — R112 Nausea with vomiting, unspecified: Secondary | ICD-10-CM | POA: Diagnosis not present

## 2020-09-10 LAB — CUP PACEART REMOTE DEVICE CHECK
Date Time Interrogation Session: 20220622150536
Implantable Pulse Generator Implant Date: 20211103

## 2020-09-12 ENCOUNTER — Ambulatory Visit (INDEPENDENT_AMBULATORY_CARE_PROVIDER_SITE_OTHER): Payer: Medicare Other

## 2020-09-12 DIAGNOSIS — I480 Paroxysmal atrial fibrillation: Secondary | ICD-10-CM | POA: Diagnosis not present

## 2020-09-17 ENCOUNTER — Encounter: Payer: Self-pay | Admitting: Pulmonary Disease

## 2020-09-17 ENCOUNTER — Ambulatory Visit (INDEPENDENT_AMBULATORY_CARE_PROVIDER_SITE_OTHER): Payer: Medicare Other | Admitting: Pulmonary Disease

## 2020-09-17 ENCOUNTER — Other Ambulatory Visit: Payer: Self-pay

## 2020-09-17 VITALS — BP 130/80 | HR 105 | Ht 64.0 in | Wt 229.6 lb

## 2020-09-17 DIAGNOSIS — K219 Gastro-esophageal reflux disease without esophagitis: Secondary | ICD-10-CM

## 2020-09-17 DIAGNOSIS — R059 Cough, unspecified: Secondary | ICD-10-CM | POA: Diagnosis not present

## 2020-09-17 DIAGNOSIS — R0602 Shortness of breath: Secondary | ICD-10-CM

## 2020-09-17 MED ORDER — BREZTRI AEROSPHERE 160-9-4.8 MCG/ACT IN AERO: 2.0000 | INHALATION_SPRAY | Freq: Two times a day (BID) | RESPIRATORY_TRACT | 0 refills | Status: DC

## 2020-09-17 NOTE — Progress Notes (Signed)
Synopsis: Referred in June 2022 for shortness of breath and cough by [name], MD  Subjective:   PATIENT ID: [name] GENDER: female DOB: 10-23-1945, MRN: [id number]   HPI  Chief Complaint  Patient presents with   Consult    Patient reports short of breath at times of exertion. She reports that cough is mostly dry and feels like she needs to cough up something but is not able.     [name] is a 75 year old woman, never smoker with GERD, esophageal dysmotility, hiatal hernia, hypertension and obesity who was referred to pulmonary clinic for evaluation of chronic cough and shortness of breath.  She reports having a mild case of COVID-19 in January 2022 and then had a shoulder replacement surgery at the end of February which her recovery was complicated by pneumonia that she was treated with 2 rounds of antibiotics with some improvement in her breathing.  She reports having a cough that is intermittently productive over the past year or so.  She also complains of exertional shortness of breath and wheezing.  She also complains of not having the energy to maintain her house which is not like her.  She has chronic hoarseness of her voice which she was followed by the [facility] team previously.  She also has nasal congestion and drainage.  She has frequent and severe GERD symptoms.  She also reports issues with her swallowing capabilities that she feels like sometimes food will get stuck in her throat and even in her chest.  She had pulmonary function tests in 2015 which were within normal limits.  She also had a sleep study done in 2015 with an AHI of 3.8.  Past Medical History:  Diagnosis Date   Allergy    Anemia    Antral polyp    benign   Anxiety    Arthritis    Asthma    B12 deficiency    Back pain    Chest pain    Constipation    Depression    Diverticulosis    Gallbladder problem    GERD (gastroesophageal reflux disease) 09/09/2011   History of transfusion     HTN (hypertension) 09/09/2011   Hyperlipemia 09/09/2011   Hypertension    IBS (irritable bowel syndrome)    Internal hemorrhoid    Joint pain    Obesity    Osteoarthritis    Paroxysmal atrial fibrillation (HCC)    Pneumonia    PONV (postoperative nausea and vomiting)    with big surgeries, none with endos etc, with colonoscopy- 5-6 years ago could not swallow    Shortness of breath    Sleep apnea    borderline per patient no cpap    SOB (shortness of breath) on exertion    Swallowing difficulty    Tachycardia    Unstable angina ([place]) 09/09/2011   Vitamin D deficiency    Vocal cord dysfunction 2013   swelling     Family History  Problem Relation Age of Onset   Hypertension Mother    Diabetes Mother    Arthritis Mother    Esophageal cancer Mother    Cancer Mother    Esophageal cancer Father 32   Cancer Father    Alcoholism Father    Other Sister        kidney mass removed   Hypertension Sister    Stroke Sister        died in 12/22/2018   Heart disease Maternal  Grandmother    Stroke Paternal Grandmother    Coronary artery disease Neg Hx    Colon cancer Neg Hx    Liver disease Neg Hx    Kidney disease Neg Hx    Rectal cancer Neg Hx    Stomach cancer Neg Hx      Social History   Socioeconomic History   Marital status: Married    Spouse name: John   Number of children: 2   Years of education: Not on file   Highest education level: Not on file  Occupational History   Occupation: retired    Fish farm manager: RETIRED  Tobacco Use   Smoking status: Never   Smokeless tobacco: Never  Vaping Use   Vaping Use: Never used  Substance and Sexual Activity   Alcohol use: No   Drug use: No   Sexual activity: Yes    Partners: Male  Other Topics Concern   Not on file  Social History Narrative   Not on file   Social Determinants of Health   Financial Resource Strain: Not on file  Food Insecurity: Not on file  Transportation Needs: Not on file  Physical Activity:  Not on file  Stress: Not on file  Social Connections: Not on file  Intimate Partner Violence: Not on file     Allergies  Allergen Reactions   Ativan [Lorazepam] Other (See Comments)    Agitation, aggressive actions, significant mental changes   Cefdinir Hives    headache   Buprenorphine Hcl Itching and Rash     Outpatient Medications Prior to Visit  Medication Sig Dispense Refill   acetaminophen (TYLENOL) 500 MG tablet Take 1,000 mg by mouth every 6 (six) hours as needed (for pain.).      albuterol (PROVENTIL HFA;VENTOLIN HFA) 108 (90 Base) MCG/ACT inhaler Inhale 2 puffs into the lungs every 6 (six) hours as needed for shortness of breath or wheezing.     ALPRAZolam (XANAX) 0.25 MG tablet Take 0.25 mg by mouth daily as needed for anxiety.     calcium carbonate (TUMS - DOSED IN MG ELEMENTAL CALCIUM) 500 MG chewable tablet Chew 1,500 mg by mouth daily as needed for indigestion or heartburn.     cetirizine (ZYRTEC) 10 MG tablet Take 10 mg by mouth as needed for allergies.     Cholecalciferol (DIALYVITE VITAMIN D 5000) 125 MCG (5000 UT) capsule Take 5,000 Units by mouth 2 (two) times daily.     dicyclomine (BENTYL) 10 MG capsule TAKE 1 CAPSULE BY MOUTH TWICE DAILY AS NEEDED 60 capsule 0   diltiazem (CARDIZEM CD) 240 MG 24 hr capsule Take 1 capsule by mouth once daily 90 capsule 0   diphenhydrAMINE (BENADRYL) 25 MG tablet Take 25 mg by mouth every 6 (six) hours as needed for allergies.     Docusate Sodium (COLACE PO) Take 1 tablet by mouth 3 (three) times daily.     DULoxetine (CYMBALTA) 60 MG capsule Take 60 mg by mouth daily.     esomeprazole (NEXIUM) 40 MG capsule TAKE 1 CAPSULE BY MOUTH TWICE DAILY BEFORE A MEAL 60 capsule 5   fluticasone (FLONASE) 50 MCG/ACT nasal spray Place 1 spray into both nostrils at bedtime as needed for allergies or rhinitis.     losartan (COZAAR) 50 MG tablet Take 50 mg by mouth daily.     Melatonin 5 MG CAPS Take 5 mg by mouth at bedtime.     methocarbamol  (ROBAXIN) 500 MG tablet Take 1 tablet (500 mg total) by  mouth every 8 (eight) hours as needed for muscle spasms. 40 tablet 1   Multiple Vitamin (MULTIVITAMIN WITH MINERALS) TABS tablet Take 1 tablet by mouth daily. Centrum     ondansetron (ZOFRAN-ODT) 8 MG disintegrating tablet Take 1 tablet (8 mg total) by mouth See admin instructions. Take 8 mg in the morning, may take an additional 8 mg as needed for nausea and vomiting 90 tablet 0   simvastatin (ZOCOR) 20 MG tablet Take 1 tablet (20 mg total) by mouth daily. 30 tablet 11   traMADol (ULTRAM) 50 MG tablet Take 50 mg by mouth every 6 (six) hours as needed for pain.     vitamin B-12 (CYANOCOBALAMIN) 1000 MCG tablet Take 1,000 mcg by mouth daily.     No facility-administered medications prior to visit.    Review of Systems  Constitutional:  Positive for malaise/fatigue. Negative for chills, fever and weight loss.  HENT:  Negative for congestion, sinus pain and sore throat.   Eyes: Negative.   Respiratory:  Positive for cough, shortness of breath and wheezing. Negative for hemoptysis and sputum production.   Cardiovascular:  Negative for chest pain, palpitations, orthopnea, claudication and leg swelling.  Gastrointestinal:  Positive for heartburn. Negative for abdominal pain, nausea and vomiting.  Genitourinary: Negative.   Musculoskeletal:  Negative for joint pain and myalgias.  Skin:  Negative for rash.  Neurological:  Negative for weakness.  Endo/Heme/Allergies: Negative.   Psychiatric/Behavioral: Negative.     Objective:   Vitals:   09/17/20 1428  BP: 130/80  Pulse: (!) 105  SpO2: 96%  Weight: 229 lb 9.6 oz (104.1 kg)  Height: 5\' 4"  (1.626 m)    Physical Exam Constitutional:      General: She is not in acute distress.    Appearance: She is obese. She is not ill-appearing.  HENT:     Head: Normocephalic and atraumatic.     Nose: Nose normal.     Mouth/Throat:     Mouth: Mucous membranes are moist.     Pharynx: Oropharynx  is clear.  Eyes:     General: No scleral icterus.    Conjunctiva/sclera: Conjunctivae normal.     Pupils: Pupils are equal, round, and reactive to light.  Cardiovascular:     Rate and Rhythm: Normal rate and regular rhythm.     Pulses: Normal pulses.     Heart sounds: Normal heart sounds. No murmur heard. Pulmonary:     Effort: Pulmonary effort is normal.     Breath sounds: Normal breath sounds. No wheezing, rhonchi or rales.  Abdominal:     General: Bowel sounds are normal.     Palpations: Abdomen is soft.  Musculoskeletal:     Right lower leg: No edema.     Left lower leg: No edema.  Lymphadenopathy:     Cervical: No cervical adenopathy.  Skin:    General: Skin is warm and dry.  Neurological:     General: No focal deficit present.     Mental Status: She is alert.  Psychiatric:        Mood and Affect: Mood normal.        Behavior: Behavior normal.        Thought Content: Thought content normal.        Judgment: Judgment normal.   CBC    Component Value Date/Time   WBC 6.2 05/08/2020 1426   RBC 4.23 05/08/2020 1426   HGB 10.2 (L) 05/17/2020 0244   HGB 13.0 02/15/2017 1207  HCT 32.9 (L) 05/17/2020 0244   HCT 40.0 02/15/2017 1207   PLT 225 05/08/2020 1426   MCV 92.2 05/08/2020 1426   MCV 94 02/15/2017 1207   MCH 28.6 05/08/2020 1426   MCHC 31.0 05/08/2020 1426   RDW 13.4 05/08/2020 1426   RDW 13.3 02/15/2017 1207   LYMPHSABS 2.4 10/02/2019 1243   LYMPHSABS 1.4 02/15/2017 1207   MONOABS 0.5 10/02/2019 1243   EOSABS 0.0 10/02/2019 1243   EOSABS 0.1 02/15/2017 1207   BASOSABS 0.0 10/02/2019 1243   BASOSABS 0.0 02/15/2017 1207   BMP Latest Ref Rng & Units 05/17/2020 05/08/2020 10/15/2019  Glucose 70 - 99 mg/dL 157(H) 98 95  BUN 8 - 23 mg/dL 21 18 15   Creatinine 0.44 - 1.00 mg/dL 0.85 0.93 0.74  BUN/Creat Ratio 12 - 28 - - -  Sodium 135 - 145 mmol/L 139 140 135  Potassium 3.5 - 5.1 mmol/L 4.6 4.4 4.0  Chloride 98 - 111 mmol/L 107 105 104  CO2 22 - 32 mmol/L 23 25  22   Calcium 8.9 - 10.3 mg/dL 8.5(L) 9.3 8.1(L)   Chest imaging: Barium Swallow 06/11/19 Moderate esophageal dysmotility with suspected intermittent esophageal spasm. No fixed esophageal narrowing or stricture. 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 No evidence of hiatal hernia.  PFT: PFT Results Latest Ref Rng & Units 08/03/2013  FVC-Pre L 2.60  FVC-Predicted Pre % 87  FVC-Post L 2.69  FVC-Predicted Post % 90  Pre FEV1/FVC % % 83  Post FEV1/FCV % % 84  FEV1-Pre L 2.17  FEV1-Predicted Pre % 96  FEV1-Post L 2.27  DLCO uncorrected ml/min/mmHg 20.38  DLCO UNC% % 88  DLVA Predicted % 102  TLC L 4.51  TLC % Predicted % 92  RV % Predicted % 89    Echo 10/26/2013: LVEF 55-60%, moderate LVH, normal atrial sizes, diastolic    dysfunction with indeterminate filling pressure.  Assessment & Plan:   Shortness of breath - Plan: CT Chest Wo Contrast  Gastroesophageal reflux disease without esophagitis  Cough - Plan: CT Chest Wo Contrast  Discussion: [name] is a 75 year old woman, never smoker with GERD, esophageal dysmotility, hiatal hernia, hypertension and obesity who was referred to pulmonary clinic for evaluation of chronic cough and shortness of breath.  She appears to have underlying asthma or reactive airways disease that is being aggravated by her significant GERD and esophageal dysmotility syndrome.  We have provided her with a sample of Breztri inhaler that she is to use 2 puffs twice daily over the next month.  If she notices benefit she is to call our office and we will send in a prescription for her to continue this medication.  We will schedule her for CT chest without contrast for further evaluation of her lung airways and parenchyma.  She needs to follow-up with her gastroenterology team regarding her esophageal dysmotility and GERD.  Follow-up in 3 months.  Freda Jackson, MD [facility] Office: [phone]   Current  Outpatient Medications:    acetaminophen (TYLENOL) 500 MG tablet, Take 1,000 mg by mouth every 6 (six) hours as needed (for pain.). , Disp: , Rfl:    albuterol (PROVENTIL HFA;VENTOLIN HFA) 108 (90 Base) MCG/ACT inhaler, Inhale 2 puffs into the lungs every 6 (six) hours as needed for shortness of breath or wheezing., Disp: , Rfl:    ALPRAZolam (XANAX) 0.25 MG tablet, Take 0.25 mg by mouth daily as needed for anxiety., Disp: , Rfl:    Budeson-Glycopyrrol-Formoterol (  BREZTRI AEROSPHERE) 160-9-4.8 MCG/ACT AERO, Inhale 2 puffs into the lungs in the morning and at bedtime., Disp: 10.7 g, Rfl: 0   calcium carbonate (TUMS - DOSED IN MG ELEMENTAL CALCIUM) 500 MG chewable tablet, Chew 1,500 mg by mouth daily as needed for indigestion or heartburn., Disp: , Rfl:    cetirizine (ZYRTEC) 10 MG tablet, Take 10 mg by mouth as needed for allergies., Disp: , Rfl:    Cholecalciferol (DIALYVITE VITAMIN D 5000) 125 MCG (5000 UT) capsule, Take 5,000 Units by mouth 2 (two) times daily., Disp: , Rfl:    dicyclomine (BENTYL) 10 MG capsule, TAKE 1 CAPSULE BY MOUTH TWICE DAILY AS NEEDED, Disp: 60 capsule, Rfl: 0   diltiazem (CARDIZEM CD) 240 MG 24 hr capsule, Take 1 capsule by mouth once daily, Disp: 90 capsule, Rfl: 0   diphenhydrAMINE (BENADRYL) 25 MG tablet, Take 25 mg by mouth every 6 (six) hours as needed for allergies., Disp: , Rfl:    Docusate Sodium (COLACE PO), Take 1 tablet by mouth 3 (three) times daily., Disp: , Rfl:    DULoxetine (CYMBALTA) 60 MG capsule, Take 60 mg by mouth daily., Disp: , Rfl:    esomeprazole (NEXIUM) 40 MG capsule, TAKE 1 CAPSULE BY MOUTH TWICE DAILY BEFORE A MEAL, Disp: 60 capsule, Rfl: 5   fluticasone (FLONASE) 50 MCG/ACT nasal spray, Place 1 spray into both nostrils at bedtime as needed for allergies or rhinitis., Disp: , Rfl:    losartan (COZAAR) 50 MG tablet, Take 50 mg by mouth daily., Disp: , Rfl:    Melatonin 5 MG CAPS, Take 5 mg by mouth at bedtime., Disp: , Rfl:    methocarbamol  (ROBAXIN) 500 MG tablet, Take 1 tablet (500 mg total) by mouth every 8 (eight) hours as needed for muscle spasms., Disp: 40 tablet, Rfl: 1   Multiple Vitamin (MULTIVITAMIN WITH MINERALS) TABS tablet, Take 1 tablet by mouth daily. Centrum, Disp: , Rfl:    ondansetron (ZOFRAN-ODT) 8 MG disintegrating tablet, Take 1 tablet (8 mg total) by mouth See admin instructions. Take 8 mg in the morning, may take an additional 8 mg as needed for nausea and vomiting, Disp: 90 tablet, Rfl: 0   simvastatin (ZOCOR) 20 MG tablet, Take 1 tablet (20 mg total) by mouth daily., Disp: 30 tablet, Rfl: 11   traMADol (ULTRAM) 50 MG tablet, Take 50 mg by mouth every 6 (six) hours as needed for pain., Disp: , Rfl:    vitamin B-12 (CYANOCOBALAMIN) 1000 MCG tablet, Take 1,000 mcg by mouth daily., Disp: , Rfl:

## 2020-09-17 NOTE — Patient Instructions (Signed)
Start Breztri inhaler 2 puffs twice daily -Rinse mouth out after each use  Give [place] a call in 3 to 4 weeks and let us know how you are doing with the inhaler, if you are doing well we will send a new prescription to continue this medication.  We will schedule you for a CT scan of the chest to further evaluate your cough and shortness of breath.  I believe a lot of your cough and hoarseness of voice are stemming from the issues with your esophagus.  I will try to get in touch with the GI group regarding their esophageal dysmotility specialist.

## 2020-09-23 ENCOUNTER — Other Ambulatory Visit: Payer: Self-pay | Admitting: Gastroenterology

## 2020-09-25 ENCOUNTER — Ambulatory Visit (HOSPITAL_BASED_OUTPATIENT_CLINIC_OR_DEPARTMENT_OTHER)
Admission: RE | Admit: 2020-09-25 | Discharge: 2020-09-25 | Disposition: A | Discharge status: Home / Self Care | Payer: Medicare Other | Source: Ambulatory Visit | Attending: Pulmonary Disease | Admitting: Pulmonary Disease

## 2020-09-25 ENCOUNTER — Other Ambulatory Visit: Payer: Self-pay

## 2020-09-25 DIAGNOSIS — I700 Atherosclerosis of aorta: Secondary | ICD-10-CM | POA: Diagnosis not present

## 2020-09-25 DIAGNOSIS — R059 Cough, unspecified: Secondary | ICD-10-CM | POA: Diagnosis not present

## 2020-09-25 DIAGNOSIS — R0600 Dyspnea, unspecified: Secondary | ICD-10-CM | POA: Diagnosis not present

## 2020-09-25 DIAGNOSIS — R0602 Shortness of breath: Secondary | ICD-10-CM | POA: Insufficient documentation

## 2020-09-26 NOTE — Progress Notes (Signed)
Carelink Summary Report / Loop Recorder 

## 2020-10-01 ENCOUNTER — Telehealth: Payer: Self-pay | Admitting: Pulmonary Disease

## 2020-10-01 NOTE — Telephone Encounter (Signed)
Called and spoke with patient. She verbalized understanding of results. She stated that she has noticed a difference with using the [facility]. Her breathing has improved but she still has the cough and hoarseness. She was able to get an appt with GI and will be seeing Dr. Duanne Guess on 08/23. She had tried to get a sooner appointment but that was the soonest she could be seen.   She wants to call her insurance to check on the price on of the [place] and call us back before we send in the prescription.   Nothing further needed at time of call.

## 2020-10-01 NOTE — Telephone Encounter (Signed)
Called and spoke with patient. She was requesting the results of her CT scan from 09/26/20.   Dr. Erin Fulling, can you please advise on her CT scan? Thanks!

## 2020-10-01 NOTE — Telephone Encounter (Signed)
Her CT scan does not show abnormalities in her airways. There is some atelectasis in the lower lung fields which is reversible lung collapse. This improves with deep breathing exercises or increasing physical activity.   How is she doing on the breztri inhaler?  [name]

## 2020-10-13 ENCOUNTER — Ambulatory Visit (INDEPENDENT_AMBULATORY_CARE_PROVIDER_SITE_OTHER): Payer: Medicare Other

## 2020-10-13 DIAGNOSIS — I480 Paroxysmal atrial fibrillation: Secondary | ICD-10-CM

## 2020-10-15 ENCOUNTER — Other Ambulatory Visit: Payer: Self-pay | Admitting: Gastroenterology

## 2020-10-15 LAB — CUP PACEART REMOTE DEVICE CHECK
Date Time Interrogation Session: 20220725150822
Implantable Pulse Generator Implant Date: 20211103

## 2020-10-21 ENCOUNTER — Telehealth: Payer: Self-pay | Admitting: Pulmonary Disease

## 2020-10-22 NOTE — Telephone Encounter (Signed)
Called and spoke to pt. Pt states she was told by [employer] that she hasnt met her deductible yet so the Breztri would be $330. Pt states once she meets her deductible she will only pay $35/month for the Breztri. Pt states she has $280 left of her deductible. Pt states she is unable to pay for the $330 this month. Advised pt the only other option would be to apply for pt assistance. Pt became irritated and insulted that this is an option. Pt refused to apply for pt assistance and requested a message be sent to Dr. Dewald for recs. I advised the pt that Dr. Dewald would not be able to prescribe anything new without her still needing to pay her deductible. Pt and husband (on speaker) became very rude and demanding an appt be made with Dr. Dewald to discuss options. Appt made for pt for 8/25 with JD. Sample of Breztri placed up front (per Amy, CMA) as pt is almost out of her current inhaler.   I advised the pt of the process of our office to research the insurance coverage and pharmacy benefits prior to sending a message to the provider. Both pt and pt's husband were extremely rude on the phone while I was informing them of this process.    I called the [facility] pharmacy and confirmed the cost of the Breztri, $330. This is still covered as the cost without coverage is over $700.    Called [employer] and spoke to rep and was advised the pt will need to pay the deductible before the copay reduces to $45/month. Pt still has $285 remaining on the deductible. The rep states there are no other covered alternatives to this medication that wouldn't require meeting the deductible.    Will forward to Dr. Dewald for recs as the pt did not want to discuss the options with me.    (More than 90 minutes were spent on this message) 

## 2020-10-26 NOTE — Telephone Encounter (Signed)
Unfortunately I do not believe there will be any better options given her insurance coverage at this time. She will need to meet her deductible one way or another before having any inhaler med better covered by her insurance.   Other medications that could be tried would be the ICS/LABA category but I think we will run into same cost issue potentially.   I do not need to see the patient any sooner than planned. Meeting me in person for a clinic visit will not solve the issues with her insurance.  Wille Glaser

## 2020-10-27 ENCOUNTER — Other Ambulatory Visit: Payer: Self-pay | Admitting: Cardiovascular Disease

## 2020-10-27 NOTE — Telephone Encounter (Signed)
Spoke with the pt and notified of response per Dr Erin Fulling. She verbalized understanding and states nothing further needed.

## 2020-10-28 ENCOUNTER — Other Ambulatory Visit: Payer: Self-pay | Admitting: Pulmonary Disease

## 2020-10-28 MED ORDER — BREZTRI AEROSPHERE 160-9-4.8 MCG/ACT IN AERO: 2.0000 | INHALATION_SPRAY | Freq: Two times a day (BID) | RESPIRATORY_TRACT | 0 refills | Status: DC

## 2020-11-07 NOTE — Progress Notes (Signed)
Carelink Summary Report / Loop Recorder 

## 2020-11-11 ENCOUNTER — Ambulatory Visit: Payer: Medicare Other | Admitting: Gastroenterology

## 2020-11-12 ENCOUNTER — Ambulatory Visit: Payer: Medicare Other | Admitting: Gastroenterology

## 2020-11-13 ENCOUNTER — Encounter: Payer: Self-pay | Admitting: Pulmonary Disease

## 2020-11-13 ENCOUNTER — Ambulatory Visit (INDEPENDENT_AMBULATORY_CARE_PROVIDER_SITE_OTHER): Payer: Medicare Other

## 2020-11-13 ENCOUNTER — Other Ambulatory Visit: Payer: Self-pay

## 2020-11-13 ENCOUNTER — Ambulatory Visit (INDEPENDENT_AMBULATORY_CARE_PROVIDER_SITE_OTHER): Payer: Medicare Other | Admitting: Pulmonary Disease

## 2020-11-13 VITALS — BP 128/80 | HR 71 | Ht 64.0 in | Wt 231.4 lb

## 2020-11-13 DIAGNOSIS — R059 Cough, unspecified: Secondary | ICD-10-CM

## 2020-11-13 DIAGNOSIS — K219 Gastro-esophageal reflux disease without esophagitis: Secondary | ICD-10-CM | POA: Diagnosis not present

## 2020-11-13 DIAGNOSIS — R0602 Shortness of breath: Secondary | ICD-10-CM | POA: Diagnosis not present

## 2020-11-13 DIAGNOSIS — I480 Paroxysmal atrial fibrillation: Secondary | ICD-10-CM | POA: Diagnosis not present

## 2020-11-13 NOTE — Patient Instructions (Addendum)
I recommend follow up with Gastroenterology  I recommend starting flonase 2 sprays per nostril daily  I recommend starting ipratropium nasal spray 2 sprays per nostril twice daily  Stop using Breztri inhaler since it has not helped your symptoms.   Sleep with the head of the bed elevated.   We will schedule you for pulmonary function test.  Speak with Dr. Havery Moros about your swallowing issues and hiatal hernia. You can ask him if you should see his partner Dr. Silverio Decamp.

## 2020-11-13 NOTE — Progress Notes (Signed)
Synopsis: Referred in June 2022 for shortness of breath and cough by Theresa Redwood, MD  Subjective:   PATIENT ID: [name] GENDER: female DOB: 1945/04/03, MRN: [id number]  HPI  Chief Complaint  Patient presents with   Follow-up    Patient is here for a follow up , she reports not doing better.     [name] is a 75 year old woman, never smoker with GERD, esophageal dysmotility, history of vocal cord dysfunction, hiatal hernia, hypertension and obesity who returns to pulmonary clinic for evaluation of chronic cough and shortness of breath.  She was provided a sample of Breztri inhaler for concern of reactive airways disease leading to her cough and shortness of breath but she did not find any relief with this inhaler.  She reports increased hoarseness of her voice along with persistent cough, trouble swallowing, dry mouth, wheezing and shortness of breath.  CT chest 09/25/2020 shows postsurgical changes at the GE junction and stomach with moderate sized hiatal hernia.  The hiatal hernia has developed since 2019.  There appears to be elevation of the left hemidiaphragm with new linear and bandlike densities suggestive of atelectasis or scarring.  No other interstitial findings or opacities noted.  She reports ENT appointment next week with Dr. Benjamine Mola. She has been followed by Dr. Havery Moros of GI.   OV 09/17/20 She reports having a mild case of COVID-19 in January 2022 and then had a shoulder replacement surgery at the end of February which her recovery was complicated by pneumonia that she was treated with 2 rounds of antibiotics with some improvement in her breathing.  She reports having a cough that is intermittently productive over the past year or so.  She also complains of exertional shortness of breath and wheezing.  She also complains of not having the energy to maintain her house which is not like her.  She has chronic hoarseness of her voice which she was followed by the  [facility] ENT team previously.  She also has nasal congestion and drainage.  She has frequent and severe GERD symptoms.  She also reports issues with her swallowing capabilities that she feels like sometimes food will get stuck in her throat and even in her chest.  She had pulmonary function tests in 2015 which were within normal limits.  She also had a sleep study done in 2015 with an AHI of 3.8.  Past Medical History:  Diagnosis Date   Allergy    Anemia    Antral polyp    benign   Anxiety    Arthritis    Asthma    B12 deficiency    Back pain    Chest pain    Constipation    Depression    Diverticulosis    Gallbladder problem    GERD (gastroesophageal reflux disease) 09/09/2011   History of transfusion    HTN (hypertension) 09/09/2011   Hyperlipemia 09/09/2011   Hypertension    IBS (irritable bowel syndrome)    Internal hemorrhoid    Joint pain    Obesity    Osteoarthritis    Paroxysmal atrial fibrillation (HCC)    Pneumonia    PONV (postoperative nausea and vomiting)    with big surgeries, none with endos etc, with colonoscopy- 5-6 years ago could not swallow    Shortness of breath    Sleep apnea    borderline per patient no cpap    SOB (shortness of breath) on exertion    Swallowing difficulty  Tachycardia    Unstable angina (HCC) 09/09/2011   Vitamin D deficiency    Vocal cord dysfunction 2013   swelling     Family History  Problem Relation Age of Onset   Hypertension Mother    Diabetes Mother    Arthritis Mother    Esophageal cancer Mother    Cancer Mother    Esophageal cancer Father 69   Cancer Father    Alcoholism Father    Other Sister        kidney mass removed   Hypertension Sister    Stroke Sister        died in 12-12-18   Heart disease Maternal Grandmother    Stroke Paternal Grandmother    Coronary artery disease Neg Hx    Colon cancer Neg Hx    Liver disease Neg Hx    Kidney disease Neg Hx    Rectal cancer Neg Hx    Stomach cancer Neg  Hx      Social History   Socioeconomic History   Marital status: Married    Spouse name: John   Number of children: 2   Years of education: Not on file   Highest education level: Not on file  Occupational History   Occupation: retired    Fish farm manager: RETIRED  Tobacco Use   Smoking status: Never   Smokeless tobacco: Never  Vaping Use   Vaping Use: Never used  Substance and Sexual Activity   Alcohol use: No   Drug use: No   Sexual activity: Yes    Partners: Male  Other Topics Concern   Not on file  Social History Narrative   Not on file   Social Determinants of Health   Financial Resource Strain: Not on file  Food Insecurity: Not on file  Transportation Needs: Not on file  Physical Activity: Not on file  Stress: Not on file  Social Connections: Not on file  Intimate Partner Violence: Not on file     Allergies  Allergen Reactions   Ativan [Lorazepam] Other (See Comments)    Agitation, aggressive actions, significant mental changes   Cefdinir Hives    headache   Buprenorphine Hcl Itching and Rash     Outpatient Medications Prior to Visit  Medication Sig Dispense Refill   acetaminophen (TYLENOL) 500 MG tablet Take 1,000 mg by mouth every 6 (six) hours as needed (for pain.).      albuterol (PROVENTIL HFA;VENTOLIN HFA) 108 (90 Base) MCG/ACT inhaler Inhale 2 puffs into the lungs every 6 (six) hours as needed for shortness of breath or wheezing.     ALPRAZolam (XANAX) 0.25 MG tablet Take 0.25 mg by mouth daily as needed for anxiety.     calcium carbonate (TUMS - DOSED IN MG ELEMENTAL CALCIUM) 500 MG chewable tablet Chew 1,500 mg by mouth daily as needed for indigestion or heartburn.     cetirizine (ZYRTEC) 10 MG tablet Take 10 mg by mouth as needed for allergies.     Cholecalciferol (DIALYVITE VITAMIN D 5000) 125 MCG (5000 UT) capsule Take 5,000 Units by mouth 2 (two) times daily.     dicyclomine (BENTYL) 10 MG capsule TAKE 1 CAPSULE BY MOUTH TWICE DAILY AS NEEDED 60  capsule 0   diltiazem (CARDIZEM CD) 240 MG 24 hr capsule Take 1 capsule by mouth once daily 90 capsule 1   diphenhydrAMINE (BENADRYL) 25 MG tablet Take 25 mg by mouth every 6 (six) hours as needed for allergies.     Docusate Sodium (  COLACE PO) Take 1 tablet by mouth 3 (three) times daily.     DULoxetine (CYMBALTA) 60 MG capsule Take 60 mg by mouth daily.     esomeprazole (NEXIUM) 40 MG capsule TAKE 1 CAPSULE BY MOUTH TWICE DAILY BEFORE A MEAL 60 capsule 2   fluticasone (FLONASE) 50 MCG/ACT nasal spray Place 1 spray into both nostrils at bedtime as needed for allergies or rhinitis.     losartan (COZAAR) 50 MG tablet Take 50 mg by mouth daily.     Melatonin 5 MG CAPS Take 5 mg by mouth at bedtime.     methocarbamol (ROBAXIN) 500 MG tablet Take 1 tablet (500 mg total) by mouth every 8 (eight) hours as needed for muscle spasms. 40 tablet 1   Multiple Vitamin (MULTIVITAMIN WITH MINERALS) TABS tablet Take 1 tablet by mouth daily. Centrum     ondansetron (ZOFRAN-ODT) 8 MG disintegrating tablet DISSOLVE 1 TABLET IN MOUTH ONCE DAILY IN THE MORNING. MAY TAKE AN ADDITIONAL 1 TABLET AS NEEDED FOR NAUSEA AND VOMITING 90 tablet 0   simvastatin (ZOCOR) 20 MG tablet Take 1 tablet (20 mg total) by mouth daily. 30 tablet 11   traMADol (ULTRAM) 50 MG tablet Take 50 mg by mouth every 6 (six) hours as needed for pain.     vitamin B-12 (CYANOCOBALAMIN) 1000 MCG tablet Take 1,000 mcg by mouth daily.     Budeson-Glycopyrrol-Formoterol (BREZTRI AEROSPHERE) 160-9-4.8 MCG/ACT AERO Inhale 2 puffs into the lungs in the morning and at bedtime. (Patient not taking: Reported on 11/13/2020) 10.7 g 0   Budeson-Glycopyrrol-Formoterol (BREZTRI AEROSPHERE) 160-9-4.8 MCG/ACT AERO Inhale 2 puffs into the lungs in the morning and at bedtime. (Patient not taking: Reported on 11/13/2020) 4.8 g 0   No facility-administered medications prior to visit.    Review of Systems  Constitutional:  Positive for malaise/fatigue. Negative for chills,  fever and weight loss.  HENT:  Negative for congestion, sinus pain and sore throat.   Eyes: Negative.   Respiratory:  Positive for cough, shortness of breath and wheezing. Negative for hemoptysis and sputum production.   Cardiovascular:  Negative for chest pain, palpitations, orthopnea, claudication and leg swelling.  Gastrointestinal:  Positive for heartburn. Negative for abdominal pain, nausea and vomiting.  Genitourinary: Negative.   Musculoskeletal:  Negative for joint pain and myalgias.  Skin:  Negative for rash.  Neurological:  Negative for weakness.  Endo/Heme/Allergies: Negative.   Psychiatric/Behavioral: Negative.     Objective:   Vitals:   11/13/20 1212  BP: 128/80  Pulse: 71  SpO2: 94%  Weight: 105 kg  Height: '5\' 4"'$  (1.626 m)    Physical Exam Constitutional:      General: She is not in acute distress.    Appearance: She is obese. She is not ill-appearing.  HENT:     Head: Normocephalic and atraumatic.     Nose: Nose normal.     Mouth/Throat:     Mouth: Mucous membranes are moist.     Pharynx: Oropharynx is clear.  Eyes:     General: No scleral icterus.    Conjunctiva/sclera: Conjunctivae normal.     Pupils: Pupils are equal, round, and reactive to light.  Cardiovascular:     Rate and Rhythm: Normal rate and regular rhythm.     Pulses: Normal pulses.     Heart sounds: Normal heart sounds. No murmur heard. Pulmonary:     Effort: Pulmonary effort is normal.     Breath sounds: Examination of the left-upper field reveals wheezing. Examination  of the left-middle field reveals wheezing. Wheezing present. No rhonchi or rales.     Comments: Upper airway wheezing Abdominal:     General: Bowel sounds are normal.     Palpations: Abdomen is soft.  Musculoskeletal:     Right lower leg: No edema.     Left lower leg: No edema.  Skin:    General: Skin is warm and dry.  Neurological:     General: No focal deficit present.     Mental Status: She is alert.   CBC     Component Value Date/Time   WBC 6.2 05/08/2020 1426   RBC 4.23 05/08/2020 1426   HGB 10.2 (L) 05/17/2020 0244   HGB 13.0 02/15/2017 1207   HCT 32.9 (L) 05/17/2020 0244   HCT 40.0 02/15/2017 1207   PLT 225 05/08/2020 1426   MCV 92.2 05/08/2020 1426   MCV 94 02/15/2017 1207   MCH 28.6 05/08/2020 1426   MCHC 31.0 05/08/2020 1426   RDW 13.4 05/08/2020 1426   RDW 13.3 02/15/2017 1207   LYMPHSABS 2.4 10/02/2019 1243   LYMPHSABS 1.4 02/15/2017 1207   MONOABS 0.5 10/02/2019 1243   EOSABS 0.0 10/02/2019 1243   EOSABS 0.1 02/15/2017 1207   BASOSABS 0.0 10/02/2019 1243   BASOSABS 0.0 02/15/2017 1207   BMP Latest Ref Rng & Units 05/17/2020 05/08/2020 10/15/2019  Glucose 70 - 99 mg/dL 157(H) 98 95  BUN 8 - 23 mg/dL '21 18 15  '$ Creatinine 0.44 - 1.00 mg/dL 0.85 0.93 0.74  BUN/Creat Ratio 12 - 28 - - -  Sodium 135 - 145 mmol/L 139 140 135  Potassium 3.5 - 5.1 mmol/L 4.6 4.4 4.0  Chloride 98 - 111 mmol/L 107 105 104  CO2 22 - 32 mmol/L '23 25 22  '$ Calcium 8.9 - 10.3 mg/dL 8.5(L) 9.3 8.1(L)   Chest imaging: CT Chest 09/25/2020 postsurgical changes at the GE junction and stomach with moderate sized hiatal hernia.  The hiatal hernia has developed since 2019.  There appears to be elevation of the left hemidiaphragm with new linear and bandlike densities suggestive of atelectasis or scarring.  No other interstitial findings or opacities noted.  Barium Swallow 06/11/19 Moderate esophageal dysmotility with suspected intermittent esophageal spasm. No fixed esophageal narrowing or stricture. 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 No evidence of hiatal hernia.  PFT: PFT Results Latest Ref Rng & Units 08/03/2013  FVC-Pre L 2.60  FVC-Predicted Pre % 87  FVC-Post L 2.69  FVC-Predicted Post % 90  Pre FEV1/FVC % % 83  Post FEV1/FCV % % 84  FEV1-Pre L 2.17  FEV1-Predicted Pre % 96  FEV1-Post L 2.27  DLCO uncorrected ml/min/mmHg 20.38  DLCO [facility]% % 88  DLVA Predicted % 102  TLC L 4.51  TLC %  Predicted % 92  RV % Predicted % 89    Echo 10/26/2013: LVEF 55-60%, moderate LVH, normal atrial sizes, diastolic    dysfunction with indeterminate filling pressure.  Assessment & Plan:   Shortness of breath - Plan: Pulmonary Function Test  Gastroesophageal reflux disease without esophagitis  Cough  Discussion: [name] is a 75 year old woman, never smoker with GERD, esophageal dysmotility, history of vocal cord dysfunction, hiatal hernia, hypertension and obesity who returns to pulmonary clinic for evaluation of chronic cough and shortness of breath.  She has upper airway wheeze on exam along with wheezing of the left upper and middle lung fields. Unsure if the wheezing of the lung fields is transmitted from the upper airway.  This could be due to recurrent vocal cord dysfunction which she suffered from in 2013 where she was followed by [facility]. She has appointment with Dr. Benjamine Mola next week of ENT.   We will check pulmonary function testing to evaluate for any upper airway obstruction occurring. She is to stop the breztri inhaler since she did not receive symptom benefit.   Upon further review of her chest imaging, she does have elevated left hemidiaphragm on recent CT chest scan compared to a CT abdomen in 2018. This could be adding to her issues of dyspnea, but I think the upper airway is of most concern at this time.   My other concern is the development of hiatal hernia on CT chest imaging which is likely leading to significant GERD. She is on PPI therapy and elevated the head of her bed slightly at night. I encouraged her to elevated the head of the bed further. She should follow up with Dr. Havery Moros of GI for further evaluation.   We will follow up with her about the PFT results and schedule follow up as needed.  Freda Jackson, MD [facility] Office: [phone]   Current Outpatient Medications:    acetaminophen (TYLENOL) 500 MG  tablet, Take 1,000 mg by mouth every 6 (six) hours as needed (for pain.). , Disp: , Rfl:    albuterol (PROVENTIL HFA;VENTOLIN HFA) 108 (90 Base) MCG/ACT inhaler, Inhale 2 puffs into the lungs every 6 (six) hours as needed for shortness of breath or wheezing., Disp: , Rfl:    ALPRAZolam (XANAX) 0.25 MG tablet, Take 0.25 mg by mouth daily as needed for anxiety., Disp: , Rfl:    calcium carbonate (TUMS - DOSED IN MG ELEMENTAL CALCIUM) 500 MG chewable tablet, Chew 1,500 mg by mouth daily as needed for indigestion or heartburn., Disp: , Rfl:    cetirizine (ZYRTEC) 10 MG tablet, Take 10 mg by mouth as needed for allergies., Disp: , Rfl:    Cholecalciferol (DIALYVITE VITAMIN D 5000) 125 MCG (5000 UT) capsule, Take 5,000 Units by mouth 2 (two) times daily., Disp: , Rfl:    dicyclomine (BENTYL) 10 MG capsule, TAKE 1 CAPSULE BY MOUTH TWICE DAILY AS NEEDED, Disp: 60 capsule, Rfl: 0   diltiazem (CARDIZEM CD) 240 MG 24 hr capsule, Take 1 capsule by mouth once daily, Disp: 90 capsule, Rfl: 1   diphenhydrAMINE (BENADRYL) 25 MG tablet, Take 25 mg by mouth every 6 (six) hours as needed for allergies., Disp: , Rfl:    Docusate Sodium (COLACE PO), Take 1 tablet by mouth 3 (three) times daily., Disp: , Rfl:    DULoxetine (CYMBALTA) 60 MG capsule, Take 60 mg by mouth daily., Disp: , Rfl:    esomeprazole (NEXIUM) 40 MG capsule, TAKE 1 CAPSULE BY MOUTH TWICE DAILY BEFORE A MEAL, Disp: 60 capsule, Rfl: 2   fluticasone (FLONASE) 50 MCG/ACT nasal spray, Place 1 spray into both nostrils at bedtime as needed for allergies or rhinitis., Disp: , Rfl:    losartan (COZAAR) 50 MG tablet, Take 50 mg by mouth daily., Disp: , Rfl:    Melatonin 5 MG CAPS, Take 5 mg by mouth at bedtime., Disp: , Rfl:    methocarbamol (ROBAXIN) 500 MG tablet, Take 1 tablet (500 mg total) by mouth every 8 (eight) hours as needed for muscle spasms., Disp: 40 tablet, Rfl: 1   Multiple Vitamin (MULTIVITAMIN WITH MINERALS) TABS tablet, Take 1 tablet by mouth  daily. Centrum, Disp: , Rfl:  ondansetron (ZOFRAN-ODT) 8 MG disintegrating tablet, DISSOLVE 1 TABLET IN MOUTH ONCE DAILY IN THE MORNING. MAY TAKE AN ADDITIONAL 1 TABLET AS NEEDED FOR NAUSEA AND VOMITING, Disp: 90 tablet, Rfl: 0   simvastatin (ZOCOR) 20 MG tablet, Take 1 tablet (20 mg total) by mouth daily., Disp: 30 tablet, Rfl: 11   traMADol (ULTRAM) 50 MG tablet, Take 50 mg by mouth every 6 (six) hours as needed for pain., Disp: , Rfl:    vitamin B-12 (CYANOCOBALAMIN) 1000 MCG tablet, Take 1,000 mcg by mouth daily., Disp: , Rfl:

## 2020-11-14 ENCOUNTER — Telehealth: Payer: Self-pay

## 2020-11-14 ENCOUNTER — Ambulatory Visit (INDEPENDENT_AMBULATORY_CARE_PROVIDER_SITE_OTHER): Payer: Medicare Other | Admitting: Pulmonary Disease

## 2020-11-14 DIAGNOSIS — R0602 Shortness of breath: Secondary | ICD-10-CM | POA: Diagnosis not present

## 2020-11-14 NOTE — Progress Notes (Signed)
Full PFT performed today. °

## 2020-11-14 NOTE — Patient Instructions (Signed)
Full PFT performed today. °

## 2020-11-14 NOTE — Telephone Encounter (Signed)
-----   Message from Yetta Flock, MD sent at 11/13/2020  8:28 PM EDT ----- Hi, thanks for the note.  I am happy to see her back to help sort this out.  I looked at my endoscopy from last year and she did have a 3 cm hiatal hernia at that time so I do not think that hernia is too new.  We can consider a repeat EGD but if the question is reflux leading to her cough and shortness of breath a 24-hour pH test may provide the best information to correlate with her symptoms.  I can talk with her about it.  Thanks  [place] can you please contact this patient and help coordinate an office follow up? Thanks  ----- Message ----- From: Freddi Starr, MD Sent: 11/13/2020   4:41 PM EDT To: Yetta Flock, MD  Hi Dr. Havery Moros,  I have been following Rip Harbour in clinic for her cough and shortness of breath. Recent CT chest scan is showing new hiatal hernia. I am concern for ongoing reflux issues for her leading to her cough and hoarseness of voice. She is also undergoing ENT evaluation for vocal cord dysfunction next week, but I wanted to touch base with you if you thought she should be seen in your clinic and evaluated via EGD.   Thanks, Wille Glaser

## 2020-11-14 NOTE — Telephone Encounter (Signed)
Spoke with patient, she has been scheduled for a follow up with Dr. Havery Moros on Friday, 12/19/20 at 3:20 PM. Pt had no concerns at the end of the call.

## 2020-11-15 LAB — PULMONARY FUNCTION TEST
DL/VA % pred: 130 %
DL/VA: 5.41 ml/min/mmHg/L
DLCO cor % pred: 100 %
DLCO cor: 18.6 ml/min/mmHg
DLCO unc % pred: 100 %
DLCO unc: 18.6 ml/min/mmHg
FEF 25-75 Post: 2.1 L/sec
FEF 25-75 Pre: 1.72 L/sec
FEF2575-%Change-Post: 22 %
FEF2575-%Pred-Post: 127 %
FEF2575-%Pred-Pre: 104 %
FEV1-%Change-Post: 4 %
FEV1-%Pred-Post: 78 %
FEV1-%Pred-Pre: 74 %
FEV1-Post: 1.6 L
FEV1-Pre: 1.53 L
FEV1FVC-%Change-Post: 2 %
FEV1FVC-%Pred-Pre: 110 %
FEV6-%Change-Post: 1 %
FEV6-%Pred-Post: 72 %
FEV6-%Pred-Pre: 71 %
FEV6-Post: 1.89 L
FEV6-Pre: 1.85 L
FEV6FVC-%Pred-Post: 105 %
FEV6FVC-%Pred-Pre: 105 %
FVC-%Change-Post: 1 %
FVC-%Pred-Post: 69 %
FVC-%Pred-Pre: 67 %
FVC-Post: 1.89 L
FVC-Pre: 1.85 L
Post FEV1/FVC ratio: 85 %
Post FEV6/FVC ratio: 100 %
Pre FEV1/FVC ratio: 83 %
Pre FEV6/FVC Ratio: 100 %
RV % pred: 74 %
RV: 1.66 L
TLC % pred: 73 %
TLC: 3.59 L

## 2020-11-17 LAB — CUP PACEART REMOTE DEVICE CHECK
Date Time Interrogation Session: 20220827151027
Implantable Pulse Generator Implant Date: 20211103

## 2020-11-18 DIAGNOSIS — K219 Gastro-esophageal reflux disease without esophagitis: Secondary | ICD-10-CM | POA: Diagnosis not present

## 2020-11-18 DIAGNOSIS — R0982 Postnasal drip: Secondary | ICD-10-CM | POA: Diagnosis not present

## 2020-11-18 DIAGNOSIS — R490 Dysphonia: Secondary | ICD-10-CM | POA: Diagnosis not present

## 2020-11-27 NOTE — Progress Notes (Signed)
Carelink Summary Report / Loop Recorder 

## 2020-12-07 ENCOUNTER — Other Ambulatory Visit: Payer: Self-pay | Admitting: Gastroenterology

## 2020-12-15 ENCOUNTER — Ambulatory Visit (INDEPENDENT_AMBULATORY_CARE_PROVIDER_SITE_OTHER): Payer: Medicare Other

## 2020-12-15 DIAGNOSIS — I480 Paroxysmal atrial fibrillation: Secondary | ICD-10-CM

## 2020-12-16 ENCOUNTER — Other Ambulatory Visit: Payer: Self-pay | Admitting: Gastroenterology

## 2020-12-18 ENCOUNTER — Ambulatory Visit: Payer: Medicare Other | Admitting: Pulmonary Disease

## 2020-12-19 ENCOUNTER — Encounter: Payer: Self-pay | Admitting: Gastroenterology

## 2020-12-19 ENCOUNTER — Ambulatory Visit (INDEPENDENT_AMBULATORY_CARE_PROVIDER_SITE_OTHER): Payer: Medicare Other | Admitting: Gastroenterology

## 2020-12-19 VITALS — BP 116/60 | HR 74 | Ht 64.0 in | Wt 232.0 lb

## 2020-12-19 DIAGNOSIS — K449 Diaphragmatic hernia without obstruction or gangrene: Secondary | ICD-10-CM | POA: Diagnosis not present

## 2020-12-19 DIAGNOSIS — K219 Gastro-esophageal reflux disease without esophagitis: Secondary | ICD-10-CM

## 2020-12-19 DIAGNOSIS — R490 Dysphonia: Secondary | ICD-10-CM

## 2020-12-19 LAB — CUP PACEART REMOTE DEVICE CHECK
Date Time Interrogation Session: 20220929150956
Implantable Pulse Generator Implant Date: 20211103

## 2020-12-19 MED ORDER — DEXLANSOPRAZOLE 60 MG PO CPDR: 60.0000 mg | DELAYED_RELEASE_CAPSULE | Freq: Every day | ORAL | 1 refills | Status: DC

## 2020-12-19 MED ORDER — SUCRALFATE 1 G PO TABS: 1.0000 g | ORAL_TABLET | Freq: Four times a day (QID) | ORAL | 2 refills | Status: DC | PRN

## 2020-12-19 NOTE — Progress Notes (Signed)
HPI :  75 year old female well-known to me in the past for suspected diverticular bleeding in the setting of anticoagulation, GERD, dysphagia.  I have not seen her since July 2021.  Since the last time I seen her she has remained off of Eliquis after she had a loop recorder placed, she states she has not had issues with atrial fibrillation holding off on anticoagulation.  She denies any further rectal bleeding and has been doing okay in that regard.  She had COVID this past January and associated pneumonia.  Since that time she states her voice has been quite weak/hoarse.  She has no sore throat or pain in her mouth but voice is weak.  She states she has seen multiple providers including pulmonary and ENT.  She states she had a laryngoscopy recently which showed some inflammation of her vocal cords.  She had a CT scan of her chest done in July which showed a moderate sized hiatal hernia.  She has had an EGD with me in 2021 showing a 3 cm hiatal hernia.  She states she does feel some heartburn typically on most days.  She states occasionally she feels that there is "fire in her mouth" and a foul taste in her mouth, she is concerned reflux is causing this as is her other physicians.  She has had chronic nausea in the morning and I have given her some Zofran for that which really helped her.  She takes that once or twice daily.  She is concerned that the cost of the Zofran has gone from a few dollars to over $130 from [place].  She has not contacted her insurance about that.  She is not having much of nausea and vomiting.  She is able to eat okay otherwise.  She is using Tums frequently for her symptoms.  She has been on numerous PPIs in the past including omeprazole, Protonix and most recently Nexium dosed at 40 mg twice daily.  She most recently had Pepcid 20 mg added nightly the past month.  She has had multiple endoscopies, barium swallow, gastric emptying study, pH test remotely.  Gastric emptying and  pH test on a long time ago.  She denies any abdominal pains.  Bowel habits seem to be stable at this time.     Prior workup: EGD 04/15/2017 - sleeve gastrectomy, otherwise normal exam - biopsies taken to rule out H pylori -    Colonoscopy 04/15/2017 - normal TI, multiple diverticuli, normal exam otherwise - biopsies taken to rule out microscopic colitis   EGD 04/23/19 -  - A 3 cm hiatal hernia was present. - The exam of the esophagus was otherwise normal. - A guidewire was placed and the scope was withdrawn. Dilation was performed in the entire esophagus with a Savary dilator with mild resistance at 17 mm and 18 mm. Relook endoscopy showed no mucosal wrents. - Evidence of a sleeve gastrectomy was found in the cardia / fundus. This was characterized by healthy appearing mucosa. - The exam of the stomach was otherwise normal. - The duodenal bulb and second portion of the duodenum were normal.     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     CT scan 06/19/19 - IMPRESSION: 1. No acute findings or explanation for the patient's symptoms. 2. Moderate size hiatal hernia status post gastric sleeve resection. 3. Extensive colonic diverticulosis without evidence of acute inflammation. 4. Stable biliary dilatation, likely physiologic post cholecystectomy. 5. Aortic  Atherosclerosis (ICD10-I70.0).   Colonoscopy 10/18/19 - The perianal and digital rectal examinations were normal. - The terminal ileum appeared normal. - Multiple medium-mouthed diverticula were found in the entire colon. Lavaged extensively, no stigmata for bleeding. - Three sessile polyps were found in the transverse colon. The polyps were 2 to 3 mm in size. These polyps were removed with a cold biopsy forceps. Resection and retrieval were complete. - Internal hemorrhoids were found during retroflexion. The hemorrhoids were small. - The exam was otherwise without  abnormality. Brown stool noted throughout the colon - no blood noted anywhere.  Suspected diverticular bleed  CT chest 09/25/20 - IMPRESSION: 1. No acute chest abnormality. No evidence for acute inflammation or consolidation. 2. Development of a moderate sized hiatal hernia. Postsurgical changes at the GE junction and stomach. 3. Areas of scarring or mild atelectasis at the lung bases, left side greater than right. 4. Aortic Atherosclerosis (ICD10-I70.0). Coronary artery calcifications. 5. Probable left renal cyst      Past Medical History:  Diagnosis Date   Allergy    Anemia    Antral polyp    benign   Anxiety    Arthritis    Asthma    B12 deficiency    Back pain    Chest pain    Constipation    Depression    Diverticulosis    Gallbladder problem    GERD (gastroesophageal reflux disease) 09/09/2011   History of transfusion    HTN (hypertension) 09/09/2011   Hyperlipemia 09/09/2011   Hypertension    IBS (irritable bowel syndrome)    Internal hemorrhoid    Joint pain    Obesity    Osteoarthritis    Paroxysmal atrial fibrillation (HCC)    Pneumonia    PONV (postoperative nausea and vomiting)    with big surgeries, none with endos etc, with colonoscopy- 5-6 years ago could not swallow    Shortness of breath    Sleep apnea    borderline per patient no cpap    SOB (shortness of breath) on exertion    Swallowing difficulty    Tachycardia    Unstable angina ([place]) 09/09/2011   Vitamin D deficiency    Vocal cord dysfunction 2013   swelling     Past Surgical History:  Procedure Laterality Date   ABDOMINAL HYSTERECTOMY  1988   ANKLE FUSION Right    x 2   BACK SURGERY  [phone]   3 ruptured discs, lower back   BILATERAL ANTERIOR TOTAL HIP ARTHROPLASTY Bilateral 11/12/2014   Procedure: BILATERAL ANTERIOR TOTAL HIP ARTHROPLASTY;  Surgeon: Paralee Cancel, MD;  Location: [facility];  Service: Orthopedics;  Laterality: Bilateral;   BRAVO [place] STUDY [name] 03/06/2013    Procedure: BRAVO [place] STUDY;  Surgeon: Inda Castle, MD;  Location: [facility];  Service: Endoscopy;  Laterality: [name];   CARDIAC CATHETERIZATION  2013   NORMAL   CHOLECYSTECTOMY     COLONOSCOPY     ELBOW SURGERY Left    removed bone chip   ESOPHAGOGASTRODUODENOSCOPY [name] 03/06/2013   Procedure: ESOPHAGOGASTRODUODENOSCOPY (EGD);  Surgeon: Inda Castle, MD;  Location: [facility];  Service: Endoscopy;  Laterality: [name];   FOOT OSTEOTOMY W/ PLANTAR FASCIA RELEASE Left    ILIOTIBIAL BAND RELEASE Right 1997   implantable loop recorder placement  01/23/2020   Medtronic Reveal Linq model LNQ 22 (RLB [id number]) implantable loop recorder   JOINT REPLACEMENT     LAPAROSCOPIC GASTRIC SLEEVE RESECTION WITH HIATAL HERNIA REPAIR  08/20/2014   Procedure:  LAPAROSCOPIC GASTRIC SLEEVE RESECTION WITH HIATAL HERNIA  REPAIR AND  UPPER ENDOSCOPY;  Surgeon: Johnathan Hausen, MD;  Location: [facility];  Service: General;;   LEFT HEART CATHETERIZATION WITH CORONARY ANGIOGRAM Bilateral 09/10/2011   Procedure: LEFT HEART CATHETERIZATION WITH CORONARY ANGIOGRAM;  Surgeon: Lorretta Harp, MD;  Location: [facility];  Service: Cardiovascular;  Laterality: Bilateral;   NASAL SINUS SURGERY  1997   REVERSE SHOULDER ARTHROPLASTY Right 05/14/2016   Procedure: RIGHT REVERSE SHOULDER ARTHROPLASTY;  Surgeon: Netta Cedars, MD;  Location: [place];  Service: Orthopedics;  Laterality: Right;   REVERSE SHOULDER ARTHROPLASTY Left 05/16/2020   Procedure: REVERSE SHOULDER ARTHROPLASTY;  Surgeon: Netta Cedars, MD;  Location: [facility];  Service: Orthopedics;  Laterality: Left;  interscalene exparel   SHOULDER SURGERY Left    tfc wrist Left 1997   thumb replacement Bilateral    one 2009 and one 2014   TOTAL ANKLE REPLACEMENT Right    x 3   TOTAL ANKLE REPLACEMENT Left    TOTAL ANKLE REPLACEMENT     TOTAL HIP ARTHROPLASTY Bilateral    TOTAL KNEE ARTHROPLASTY Bilateral    left 2002, right 2012   TOTAL SHOULDER ARTHROPLASTY Right 05/14/2016    TRANSTHORACIC ECHOCARDIOGRAM  09/09/2011   MILD CONCENTRIC LVH. TRACE MITRAL REGURG. TRACE TR. MILD AORTIC REGURG.   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   UPPER GI ENDOSCOPY  08/20/2014   Procedure: UPPER GI ENDOSCOPY;  Surgeon: Johnathan Hausen, MD;  Location: [facility];  Service: General;;   WRIST SURGERY Left 1997, 2009    Family History  Problem Relation Age of Onset   Hypertension Mother    Diabetes Mother    Arthritis Mother    Esophageal cancer Mother    Cancer Mother    Esophageal cancer Father 32   Cancer Father    Alcoholism Father    Other Sister        kidney mass removed   Hypertension Sister    Stroke Sister        died in 2018/12/18   Heart disease Maternal Grandmother    Stroke Paternal Grandmother    Coronary artery disease Neg Hx    Colon cancer Neg Hx    Liver disease Neg Hx    Kidney disease Neg Hx    Rectal cancer Neg Hx    Stomach cancer Neg Hx    Social History   Tobacco Use   Smoking status: Never   Smokeless tobacco: Never  Vaping Use   Vaping Use: Never used  Substance Use Topics   Alcohol use: No   Drug use: No   Current Outpatient Medications  Medication Sig Dispense Refill   acetaminophen (TYLENOL) 500 MG tablet Take 1,000 mg by mouth every 6 (six) hours as needed (for pain.).      albuterol (PROVENTIL HFA;VENTOLIN HFA) 108 (90 Base) MCG/ACT inhaler Inhale 2 puffs into the lungs every 6 (six) hours as needed for shortness of breath or wheezing.     ALPRAZolam (XANAX) 0.25 MG tablet Take 0.25 mg by mouth daily as needed for anxiety.     BIOTIN PO Take 1 tablet by mouth daily.     calcium carbonate (TUMS - DOSED IN MG ELEMENTAL CALCIUM) 500 MG chewable tablet Chew 1,500 mg by mouth daily as needed for indigestion or heartburn.     cetirizine (ZYRTEC) 10 MG tablet Take 10 mg by mouth as needed for allergies.     Cholecalciferol (DIALYVITE VITAMIN  D 5000) 125 MCG (5000 UT) capsule Take 5,000 Units by mouth 2 (two) times daily.      dicyclomine (BENTYL) 10 MG capsule TAKE 1 CAPSULE BY MOUTH TWICE DAILY AS NEEDED 60 capsule 0   diltiazem (CARDIZEM CD) 240 MG 24 hr capsule Take 1 capsule by mouth once daily 90 capsule 1   Docusate Sodium (COLACE PO) Take 1 tablet by mouth 2 (two) times daily.     DULoxetine (CYMBALTA) 60 MG capsule Take 60 mg by mouth daily.     esomeprazole (NEXIUM) 40 MG capsule TAKE 1 CAPSULE BY MOUTH TWICE DAILY BEFORE A MEAL 60 capsule 2   famotidine (PEPCID) 20 MG tablet Take 20 mg by mouth daily.     ipratropium (ATROVENT) 0.06 % nasal spray Place 2 sprays into both nostrils daily.     losartan (COZAAR) 50 MG tablet Take 50 mg by mouth daily.     Melatonin 5 MG CAPS Take 5 mg by mouth at bedtime.     methocarbamol (ROBAXIN) 500 MG tablet Take 1 tablet (500 mg total) by mouth every 8 (eight) hours as needed for muscle spasms. 40 tablet 1   Multiple Vitamin (MULTIVITAMIN WITH MINERALS) TABS tablet Take 1 tablet by mouth daily. Centrum     ondansetron (ZOFRAN-ODT) 8 MG disintegrating tablet Take 1 tablet (8 mg total) by mouth every 8 (eight) hours as needed for nausea. 30 tablet 1   simvastatin (ZOCOR) 20 MG tablet Take 1 tablet (20 mg total) by mouth daily. 30 tablet 11   traMADol (ULTRAM) 50 MG tablet Take 50 mg by mouth every 6 (six) hours as needed for pain.     vitamin B-12 (CYANOCOBALAMIN) 1000 MCG tablet Take 1,000 mcg by mouth daily.     No current facility-administered medications for this visit.   Allergies  Allergen Reactions   Ativan [Lorazepam] Other (See Comments)    Agitation, aggressive actions, significant mental changes   Cefdinir Hives    headache   Buprenorphine Hcl Itching and Rash     Review of Systems: All systems reviewed and negative except where noted in HPI.   Lab Results  Component Value Date   WBC 6.2 05/08/2020   HGB 10.2 (L) 05/17/2020   HCT 32.9 (L) 05/17/2020   MCV 92.2 05/08/2020   PLT 225 05/08/2020    Lab Results  Component Value Date   CREATININE  0.85 05/17/2020   BUN 21 05/17/2020   NA 139 05/17/2020   K 4.6 05/17/2020   CL 107 05/17/2020   CO2 23 05/17/2020    Lab Results  Component Value Date   ALT 23 10/14/2019   AST 23 10/14/2019   ALKPHOS 61 10/14/2019   BILITOT 0.6 10/14/2019     Physical Exam: BP 116/60   Pulse 74   Ht 5\' 4"  (1.626 m)   Wt 232 lb (105.2 kg)   SpO2 96%   BMI 39.82 kg/m  Constitutional: Pleasant,well-developed, female in no acute distress. Neurological: Alert and oriented to person place and time. Psychiatric: Normal mood and affect. Behavior is normal.   ASSESSMENT AND PLAN: 75 year old female here for reassessment following:  Voice hoarseness GERD Hiatal hernia Long term use of PPI  Patient with a history of reflux has been on a variety of PPIs, most recently Nexium 40 mg twice daily.  Continues to have some pyrosis on a daily basis and now with a very hoarse voice.  Certainly possible that reflux is driving her voice change.  She  has had an ENT evaluation showing some nonspecific inflammatory change.  Discussed options with her.  She has been on pretty aggressive medical therapy, I tried getting her Dexilant in the past which she could not afford however now having failed 3 other PPIs in the next dose will see if we can give her a prescription of Dexilant 60 mg daily to see if that will help.  I agree with adding Pepcid 20 mg nightly.  Ultimately the question is reflux driving this problem.  If that is the case she might need surgery to repair the hiatal hernia.  She wants to avoid surgery if at all possible.  She is agreeable to Dexilant 60 mg daily, continuing Pepcid 20 mg nightly, I will also add Carafate 1 tablet every 6 hours (she did not want to take a slurry).  She understands this and maximal medical therapy if her symptoms persist we will plan on proceeding with 24-hour pH impedance test/manometry, as well as gastric emptying study to assess her candidacy for surgery and to correlate  her symptoms with presence of nonacid reflux.  She is in agreement with the plan, would like to hear back from her in a few days.  We may need to appeal to her insurance to get [facility] covered.  Otherwise we will refill her Zofran.  She is concerned about cost of this at [facility] however we looked and found a much more reasonable price at [facility]  Plan: - Continue pepcid 20mg  q HS - switch nexium to Dexilant 60mg  daily - take 1/2 hour prior to a meal - Carafate tablet- 1 tab every 6 hours PRN (patient will not take liquids) - continue Zofran - If no better will need 24 hour pH test / manometry to confirm if she is having breakthrough nonacid reflux that correlates with her symptoms. If that is the case would then need GES to rule out gastroparesis and surgical evaluation for definitive treatment.    Jolly Mango, MD [facility]

## 2020-12-19 NOTE — Patient Instructions (Signed)
If you are age 74 or older, your body mass index should be between 23-30. Your Body mass index is 39.82 kg/m. If this is out of the aforementioned range listed, please consider follow up with your Primary Care Provider.  If you are age 28 or younger, your body mass index should be between 19-25. Your Body mass index is 39.82 kg/m. If this is out of the aformentioned range listed, please consider follow up with your Primary Care Provider.   __________________________________________________________  The [facility] providers would like to encourage you to use [facility] to communicate with providers for non-urgent requests or questions.  Due to long hold times on the telephone, sending your provider a message by [facility] may be a faster and more efficient way to get a response.  Please allow 48 business hours for a response.  Please remember that this is for non-urgent requests.   Continue Pepcid 20 mg: Take daily at bedtime  We have sent the following medications to your pharmacy for you to pick up at your convenience: Dexilant 60 mg: Take once daily (stop Nexium) Carafate 1 g: Take every 6 hours as needed  Thank you for entrusting me with your care and for choosing [employer], Dr. Waltham Cellar

## 2020-12-22 NOTE — Progress Notes (Signed)
Carelink Summary Report / Loop Recorder 

## 2021-01-01 DIAGNOSIS — R490 Dysphonia: Secondary | ICD-10-CM | POA: Diagnosis not present

## 2021-01-01 DIAGNOSIS — K219 Gastro-esophageal reflux disease without esophagitis: Secondary | ICD-10-CM | POA: Diagnosis not present

## 2021-01-10 DIAGNOSIS — Z23 Encounter for immunization: Secondary | ICD-10-CM | POA: Diagnosis not present

## 2021-01-15 ENCOUNTER — Ambulatory Visit (INDEPENDENT_AMBULATORY_CARE_PROVIDER_SITE_OTHER): Payer: Medicare Other

## 2021-01-15 DIAGNOSIS — I480 Paroxysmal atrial fibrillation: Secondary | ICD-10-CM | POA: Diagnosis not present

## 2021-01-20 LAB — CUP PACEART REMOTE DEVICE CHECK
Date Time Interrogation Session: 20221101150749
Implantable Pulse Generator Implant Date: 20211103

## 2021-01-22 NOTE — Progress Notes (Signed)
Carelink Summary Report / Loop Recorder 

## 2021-01-27 ENCOUNTER — Other Ambulatory Visit: Payer: Self-pay

## 2021-01-27 ENCOUNTER — Ambulatory Visit (INDEPENDENT_AMBULATORY_CARE_PROVIDER_SITE_OTHER): Payer: Medicare Other | Admitting: Cardiovascular Disease

## 2021-01-27 ENCOUNTER — Encounter: Payer: Self-pay | Admitting: Cardiovascular Disease

## 2021-01-27 VITALS — BP 133/79 | HR 100 | Ht 64.0 in | Wt 233.4 lb

## 2021-01-27 DIAGNOSIS — I480 Paroxysmal atrial fibrillation: Secondary | ICD-10-CM | POA: Diagnosis not present

## 2021-01-27 DIAGNOSIS — I10 Essential (primary) hypertension: Secondary | ICD-10-CM | POA: Diagnosis not present

## 2021-01-27 DIAGNOSIS — E782 Mixed hyperlipidemia: Secondary | ICD-10-CM | POA: Diagnosis not present

## 2021-01-27 NOTE — Patient Instructions (Signed)

## 2021-01-27 NOTE — Progress Notes (Signed)
01/27/2021 [name]   1945-12-29  [id number]  Primary Physician Brigitte Pulse Emily Filbert., MD Primary Cardiologist: Lorretta Harp MD FACP, [employer], [place], [place]  HPI:  [name] is a 75 y.o.  severely overweight, married Caucasian female who was seen by Dr. Aldona Bar in the office on September 08, 2011, for chest pain. I last saw her in the office    04/25/2019 she had a.Marland KitchenMarland KitchenHer risk factors include hypertension and hyperlipidemia. She also has GERD. She was admitted for heart cath which I performed radially on September 10, 2011, that was essentially normal with normal LV function. Her 2D echo was normal as well. Her major complaints have been progressive dyspnea on exertion. She does have reactive airway disease and had pneumonia in the recent past as well. I saw her last 7-10/13 she has been evaluated at [facility] and was told she had vocal cord dysfunction. She is seeing Dr. Halford Chessman for pulmonary evaluation for her back here for evaluation of sinus tachycardia. She really denies chest pain but does get dyspneic on exertion which was a complaint when I saw her 2 years ago as well. Recent thyroid function tests were normal. An event monitor showed sinus rhythm, sinus tachycardia with episodes of paroxysmal atrial fibrillation. Based on her age, is gender and history of hypertension she would be a candidate for oral anticoagulation.  Because of a CHA2DS2VASC score of 3, she was begun on Eiquis oral anticoagulations.She has had bariatric surgery performed by Dr. 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 Alvan Dame. She has since lost 90 pounds and is now ambulatory.she denies chest pain or shortness of breath. She is much more active and has more energy. A 30 day event monitor showed sinus rhythm with PACs but no evidence of PAF last year and therefore I discontinued oral anticoagulation. She recently had a right shoulder replacement by Dr. Veverly Fells and slowly  recuperating from this. She is still suffering from vocal cord dysfunction and episodic dyspnea that we do not think this is cardiovascular in nature.   Since I saw her 3 months ago she is done well.  She did have esophageal stricture stretching and was noted to be in atrial fibrillation at that time . She is a unaware and asymptomatic of her A. fib.  Her Eliquis was discontinued because of rectal bleeding.  I subsequently recorder was implanted which has not shown subsequent AF.  She denies chest pain but has chronic dyspnea on exertion.  Current Meds  Medication Sig   acetaminophen (TYLENOL) 500 MG tablet Take 1,000 mg by mouth every 6 (six) hours as needed (for pain.).    albuterol (PROVENTIL HFA;VENTOLIN HFA) 108 (90 Base) MCG/ACT inhaler Inhale 2 puffs into the lungs every 6 (six) hours as needed for shortness of breath or wheezing.   ALPRAZolam (XANAX) 0.25 MG tablet Take 0.25 mg by mouth daily as needed for anxiety.   BIOTIN PO Take 1 tablet by mouth daily.   calcium carbonate (TUMS - DOSED IN MG ELEMENTAL CALCIUM) 500 MG chewable tablet Chew 1,500 mg by mouth daily as needed for indigestion or heartburn.   Cholecalciferol (DIALYVITE VITAMIN D 5000) 125 MCG (5000 UT) capsule Take 5,000 Units by mouth 2 (two) times daily.   dicyclomine (BENTYL) 10 MG capsule TAKE 1 CAPSULE BY MOUTH TWICE DAILY AS NEEDED   diltiazem (CARDIZEM CD) 240 MG 24 hr capsule Take 1 capsule by mouth once daily  Docusate Sodium (COLACE PO) Take 1 tablet by mouth 2 (two) times daily.   DULoxetine (CYMBALTA) 60 MG capsule Take 60 mg by mouth daily.   famotidine (PEPCID) 20 MG tablet Take 20 mg by mouth daily.   ipratropium (ATROVENT) 0.06 % nasal spray Place 2 sprays into both nostrils daily.   losartan (COZAAR) 50 MG tablet Take 50 mg by mouth daily.   Melatonin 5 MG CAPS Take 5 mg by mouth at bedtime.   Multiple Vitamin (MULTIVITAMIN WITH MINERALS) TABS tablet Take 1 tablet by mouth daily. Centrum   ondansetron  (ZOFRAN-ODT) 8 MG disintegrating tablet Take 1 tablet (8 mg total) by mouth every 8 (eight) hours as needed for nausea.   simvastatin (ZOCOR) 20 MG tablet Take 1 tablet (20 mg total) by mouth daily.   sucralfate (CARAFATE) 1 g tablet Take 1 tablet (1 g total) by mouth every 6 (six) hours as needed.   vitamin B-12 (CYANOCOBALAMIN) 1000 MCG tablet Take 1,000 mcg by mouth daily.     Allergies  Allergen Reactions   Ativan [Lorazepam] Other (See Comments)    Agitation, aggressive actions, significant mental changes   Cefdinir Hives    headache   Buprenorphine Hcl Itching and Rash    Social History   Socioeconomic History   Marital status: Married    Spouse name: John   Number of children: 2   Years of education: Not on file   Highest education level: Not on file  Occupational History   Occupation: retired    Fish farm manager: RETIRED  Tobacco Use   Smoking status: Never   Smokeless tobacco: Never  Vaping Use   Vaping Use: Never used  Substance and Sexual Activity   Alcohol use: No   Drug use: No   Sexual activity: Yes    Partners: Male  Other Topics Concern   Not on file  Social History Narrative   Not on file   Social Determinants of Health   Financial Resource Strain: Not on file  Food Insecurity: Not on file  Transportation Needs: Not on file  Physical Activity: Not on file  Stress: Not on file  Social Connections: Not on file  Intimate Partner Violence: Not on file     Review of Systems: General: negative for chills, fever, night sweats or weight changes.  Cardiovascular: negative for chest pain, dyspnea on exertion, edema, orthopnea, palpitations, paroxysmal nocturnal dyspnea or shortness of breath Dermatological: negative for rash Respiratory: negative for cough or wheezing Urologic: negative for hematuria Abdominal: negative for nausea, vomiting, diarrhea, bright red blood per rectum, melena, or hematemesis Neurologic: negative for visual changes, syncope, or  dizziness All other systems reviewed and are otherwise negative except as noted above.    Blood pressure 133/79, pulse 100, height 5\' 4"  (1.626 m), weight 233 lb 6.4 oz (105.9 kg), SpO2 95 %.  General appearance: alert and no distress Neck: no adenopathy, no carotid bruit, no JVD, supple, symmetrical, trachea midline, and thyroid not enlarged, symmetric, no tenderness/mass/nodules Lungs: clear to auscultation bilaterally Heart: regular rate and rhythm, S1, S2 normal, no murmur, click, rub or gallop Extremities: extremities normal, atraumatic, no cyanosis or edema Pulses: 2+ and symmetric Skin: Skin color, texture, turgor normal. No rashes or lesions Neurologic: Grossly normal  EKG sinus tachycardia at 100 with left anterior fascicular block and voltage for LVH.  I personally reviewed this EKG.  ASSESSMENT AND PLAN:   Hyperlipemia History of hyperlipidemia on statin therapy with lipid profile performed 04/03/2020 revealing total cholesterol  of 199, LDL 102 and HDL 79.  Essential hypertension History of essential hypertension blood pressure measured today at 133/79.  She is on diltiazem and losartan.  Paroxysmal atrial fibrillation (HCC) History of PAF in the past on Eliquis however because of rectal bleeding a Linq recorder was placed which revealed no recurrent A. fib and therefore Eliquis was discontinued.     Lorretta Harp MD [place], [facility] 01/27/2021 11:20 AM

## 2021-01-27 NOTE — Assessment & Plan Note (Signed)
History of hyperlipidemia on statin therapy with lipid profile performed 04/03/2020 revealing total cholesterol of 199, LDL 102 and HDL 79.

## 2021-01-27 NOTE — Assessment & Plan Note (Signed)
History of PAF in the past on Eliquis however because of rectal bleeding a Linq recorder was placed which revealed no recurrent A. fib and therefore Eliquis was discontinued.

## 2021-01-27 NOTE — Assessment & Plan Note (Signed)
History of essential hypertension blood pressure measured today at 133/79.  She is on diltiazem and losartan.

## 2021-02-16 ENCOUNTER — Ambulatory Visit (INDEPENDENT_AMBULATORY_CARE_PROVIDER_SITE_OTHER): Payer: Medicare Other

## 2021-02-16 DIAGNOSIS — I480 Paroxysmal atrial fibrillation: Secondary | ICD-10-CM

## 2021-02-18 ENCOUNTER — Other Ambulatory Visit: Payer: Self-pay | Admitting: Gastroenterology

## 2021-02-23 LAB — CUP PACEART REMOTE DEVICE CHECK
Date Time Interrogation Session: 20221204151009
Implantable Pulse Generator Implant Date: 20211103

## 2021-02-24 NOTE — Progress Notes (Signed)
Carelink Summary Report / Loop Recorder 

## 2021-03-13 ENCOUNTER — Other Ambulatory Visit: Payer: Self-pay | Admitting: Gastroenterology

## 2021-03-15 ENCOUNTER — Other Ambulatory Visit: Payer: Self-pay | Admitting: Gastroenterology

## 2021-03-17 ENCOUNTER — Other Ambulatory Visit: Payer: Self-pay | Admitting: Gastroenterology

## 2021-03-19 ENCOUNTER — Ambulatory Visit (INDEPENDENT_AMBULATORY_CARE_PROVIDER_SITE_OTHER): Payer: Medicare Other

## 2021-03-19 DIAGNOSIS — I480 Paroxysmal atrial fibrillation: Secondary | ICD-10-CM | POA: Diagnosis not present

## 2021-03-19 LAB — CUP PACEART REMOTE DEVICE CHECK
Date Time Interrogation Session: 20221228230146
Implantable Pulse Generator Implant Date: 20211103

## 2021-03-20 DIAGNOSIS — L820 Inflamed seborrheic keratosis: Secondary | ICD-10-CM | POA: Diagnosis not present

## 2021-03-20 DIAGNOSIS — D2239 Melanocytic nevi of other parts of face: Secondary | ICD-10-CM | POA: Diagnosis not present

## 2021-03-20 DIAGNOSIS — D485 Neoplasm of uncertain behavior of skin: Secondary | ICD-10-CM | POA: Diagnosis not present

## 2021-03-20 DIAGNOSIS — Z85828 Personal history of other malignant neoplasm of skin: Secondary | ICD-10-CM | POA: Diagnosis not present

## 2021-03-20 DIAGNOSIS — L430 Hypertrophic lichen planus: Secondary | ICD-10-CM | POA: Diagnosis not present

## 2021-03-31 NOTE — Progress Notes (Signed)
Carelink Summary Report / Loop Recorder 

## 2021-04-07 DIAGNOSIS — Z961 Presence of intraocular lens: Secondary | ICD-10-CM | POA: Diagnosis not present

## 2021-04-07 DIAGNOSIS — H26493 Other secondary cataract, bilateral: Secondary | ICD-10-CM | POA: Diagnosis not present

## 2021-04-07 DIAGNOSIS — H52203 Unspecified astigmatism, bilateral: Secondary | ICD-10-CM | POA: Diagnosis not present

## 2021-04-09 ENCOUNTER — Other Ambulatory Visit (HOSPITAL_BASED_OUTPATIENT_CLINIC_OR_DEPARTMENT_OTHER): Payer: Self-pay

## 2021-04-09 MED ORDER — SHINGRIX 50 MCG/0.5ML IM SUSR
INTRAMUSCULAR | 1 refills | Status: DC
  Filled 2021-04-09: qty 0.5, 1d supply, fill #0
  Filled 2021-06-15: qty 0.5, 1d supply, fill #1

## 2021-04-14 ENCOUNTER — Other Ambulatory Visit: Payer: Self-pay | Admitting: Gastroenterology

## 2021-04-15 ENCOUNTER — Other Ambulatory Visit (HOSPITAL_BASED_OUTPATIENT_CLINIC_OR_DEPARTMENT_OTHER): Payer: Self-pay

## 2021-04-21 ENCOUNTER — Ambulatory Visit (INDEPENDENT_AMBULATORY_CARE_PROVIDER_SITE_OTHER): Payer: Medicare Other

## 2021-04-21 DIAGNOSIS — I480 Paroxysmal atrial fibrillation: Secondary | ICD-10-CM

## 2021-04-21 LAB — CUP PACEART REMOTE DEVICE CHECK
Date Time Interrogation Session: 20230131071842
Implantable Pulse Generator Implant Date: 20211103

## 2021-04-27 DIAGNOSIS — Z1231 Encounter for screening mammogram for malignant neoplasm of breast: Secondary | ICD-10-CM | POA: Diagnosis not present

## 2021-04-28 ENCOUNTER — Other Ambulatory Visit: Payer: Self-pay | Admitting: Cardiovascular Disease

## 2021-04-29 NOTE — Progress Notes (Signed)
Carelink Summary Report / Loop Recorder 

## 2021-05-06 DIAGNOSIS — E785 Hyperlipidemia, unspecified: Secondary | ICD-10-CM | POA: Diagnosis not present

## 2021-05-06 DIAGNOSIS — R7301 Impaired fasting glucose: Secondary | ICD-10-CM | POA: Diagnosis not present

## 2021-05-06 DIAGNOSIS — I10 Essential (primary) hypertension: Secondary | ICD-10-CM | POA: Diagnosis not present

## 2021-05-06 DIAGNOSIS — E559 Vitamin D deficiency, unspecified: Secondary | ICD-10-CM | POA: Diagnosis not present

## 2021-05-18 ENCOUNTER — Other Ambulatory Visit: Payer: Self-pay | Admitting: Gastroenterology

## 2021-05-25 ENCOUNTER — Ambulatory Visit (INDEPENDENT_AMBULATORY_CARE_PROVIDER_SITE_OTHER): Payer: Medicare Other

## 2021-05-25 DIAGNOSIS — I480 Paroxysmal atrial fibrillation: Secondary | ICD-10-CM | POA: Diagnosis not present

## 2021-05-26 LAB — CUP PACEART REMOTE DEVICE CHECK
Date Time Interrogation Session: 20230305230227
Implantable Pulse Generator Implant Date: 20211103

## 2021-06-04 DIAGNOSIS — Z0000 Encounter for general adult medical examination without abnormal findings: Secondary | ICD-10-CM | POA: Diagnosis not present

## 2021-06-04 DIAGNOSIS — G3184 Mild cognitive impairment, so stated: Secondary | ICD-10-CM | POA: Diagnosis not present

## 2021-06-04 DIAGNOSIS — Z1331 Encounter for screening for depression: Secondary | ICD-10-CM | POA: Diagnosis not present

## 2021-06-04 DIAGNOSIS — I700 Atherosclerosis of aorta: Secondary | ICD-10-CM | POA: Diagnosis not present

## 2021-06-04 DIAGNOSIS — Z1339 Encounter for screening examination for other mental health and behavioral disorders: Secondary | ICD-10-CM | POA: Diagnosis not present

## 2021-06-04 DIAGNOSIS — E559 Vitamin D deficiency, unspecified: Secondary | ICD-10-CM | POA: Diagnosis not present

## 2021-06-04 DIAGNOSIS — I10 Essential (primary) hypertension: Secondary | ICD-10-CM | POA: Diagnosis not present

## 2021-06-04 DIAGNOSIS — J0100 Acute maxillary sinusitis, unspecified: Secondary | ICD-10-CM | POA: Diagnosis not present

## 2021-06-04 DIAGNOSIS — Z9884 Bariatric surgery status: Secondary | ICD-10-CM | POA: Diagnosis not present

## 2021-06-04 DIAGNOSIS — M8580 Other specified disorders of bone density and structure, unspecified site: Secondary | ICD-10-CM | POA: Diagnosis not present

## 2021-06-04 DIAGNOSIS — E785 Hyperlipidemia, unspecified: Secondary | ICD-10-CM | POA: Diagnosis not present

## 2021-06-04 DIAGNOSIS — R7301 Impaired fasting glucose: Secondary | ICD-10-CM | POA: Diagnosis not present

## 2021-06-04 DIAGNOSIS — J383 Other diseases of vocal cords: Secondary | ICD-10-CM | POA: Diagnosis not present

## 2021-06-04 DIAGNOSIS — R82998 Other abnormal findings in urine: Secondary | ICD-10-CM | POA: Diagnosis not present

## 2021-06-05 NOTE — Progress Notes (Signed)
Carelink Summary Report / Loop Recorder 

## 2021-06-10 ENCOUNTER — Other Ambulatory Visit: Payer: Self-pay | Admitting: Gastroenterology

## 2021-06-15 ENCOUNTER — Other Ambulatory Visit (HOSPITAL_BASED_OUTPATIENT_CLINIC_OR_DEPARTMENT_OTHER): Payer: Self-pay

## 2021-06-18 ENCOUNTER — Other Ambulatory Visit (HOSPITAL_BASED_OUTPATIENT_CLINIC_OR_DEPARTMENT_OTHER): Payer: Self-pay

## 2021-06-29 ENCOUNTER — Ambulatory Visit (INDEPENDENT_AMBULATORY_CARE_PROVIDER_SITE_OTHER): Payer: Medicare Other

## 2021-06-29 DIAGNOSIS — I480 Paroxysmal atrial fibrillation: Secondary | ICD-10-CM | POA: Diagnosis not present

## 2021-07-01 LAB — CUP PACEART REMOTE DEVICE CHECK
Date Time Interrogation Session: 20230412073733
Implantable Pulse Generator Implant Date: 20211103

## 2021-07-16 NOTE — Progress Notes (Signed)
Carelink Summary Report / Loop Recorder 

## 2021-08-03 ENCOUNTER — Ambulatory Visit (INDEPENDENT_AMBULATORY_CARE_PROVIDER_SITE_OTHER): Payer: Medicare Other

## 2021-08-03 DIAGNOSIS — I480 Paroxysmal atrial fibrillation: Secondary | ICD-10-CM | POA: Diagnosis not present

## 2021-08-05 LAB — CUP PACEART REMOTE DEVICE CHECK
Date Time Interrogation Session: 20230517110748
Implantable Pulse Generator Implant Date: 20211103

## 2021-08-11 ENCOUNTER — Other Ambulatory Visit: Payer: Self-pay | Admitting: Cardiovascular Disease

## 2021-08-12 ENCOUNTER — Ambulatory Visit (INDEPENDENT_AMBULATORY_CARE_PROVIDER_SITE_OTHER): Payer: Medicare Other | Admitting: Internal Medicine

## 2021-08-12 ENCOUNTER — Encounter: Payer: Self-pay | Admitting: Internal Medicine

## 2021-08-12 VITALS — BP 122/80 | HR 91 | Ht 63.5 in | Wt 237.0 lb

## 2021-08-12 DIAGNOSIS — I480 Paroxysmal atrial fibrillation: Secondary | ICD-10-CM

## 2021-08-12 NOTE — Progress Notes (Signed)
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 female who presents today for routine electrophysiology followup.  Since last being seen in our clinic, the patient reports doing very well.  She has chronic SOB and dizziness. She is not very active.  Today, she denies symptoms of palpitations, chest pain,   lower extremity edema, presyncope, or syncope.  The patient is otherwise without complaint today.   Past Medical History:  Diagnosis Date   Allergy    Anemia    Antral polyp    benign   Anxiety    Arthritis    Asthma    B12 deficiency    Back pain    Chest pain    Constipation    Depression    Diverticulosis    Gallbladder problem    GERD (gastroesophageal reflux disease) 09/09/2011   History of transfusion    HTN (hypertension) 09/09/2011   Hyperlipemia 09/09/2011   Hypertension    IBS (irritable bowel syndrome)    Internal hemorrhoid    Joint pain    Obesity    Osteoarthritis    Paroxysmal atrial fibrillation (HCC)    Pneumonia    PONV (postoperative nausea and vomiting)    with big surgeries, none with endos etc, with colonoscopy- 5-6 years ago could not swallow    Shortness of breath    Sleep apnea    borderline per patient no cpap    SOB (shortness of breath) on exertion    Swallowing difficulty    Tachycardia    Unstable angina ([place]) 09/09/2011   Vitamin D deficiency    Vocal cord dysfunction 2013   swelling   Past Surgical History:  Procedure Laterality Date   ABDOMINAL HYSTERECTOMY  1988   ANKLE FUSION Right    x 2   BACK SURGERY  [phone]   3 ruptured discs, lower back   BILATERAL ANTERIOR TOTAL HIP ARTHROPLASTY Bilateral 11/12/2014   Procedure: BILATERAL ANTERIOR TOTAL HIP ARTHROPLASTY;  Surgeon: Paralee Cancel, MD;  Location: [facility];  Service: Orthopedics;  Laterality: Bilateral;   BRAVO [place] STUDY [name] 03/06/2013   Procedure: BRAVO [place] STUDY;  Surgeon: Inda Castle, MD;  Location: [facility];  Service:  Endoscopy;  Laterality: [name];   CARDIAC CATHETERIZATION  2013   NORMAL   CHOLECYSTECTOMY     COLONOSCOPY     ELBOW SURGERY Left    removed bone chip   ESOPHAGOGASTRODUODENOSCOPY [name] 03/06/2013   Procedure: ESOPHAGOGASTRODUODENOSCOPY (EGD);  Surgeon: Inda Castle, MD;  Location: [facility];  Service: Endoscopy;  Laterality: [name];   FOOT OSTEOTOMY W/ PLANTAR FASCIA RELEASE Left    ILIOTIBIAL BAND RELEASE Right 1997   implantable loop recorder placement  01/23/2020   Medtronic Reveal Linq model LNQ 22 (RLB [id number]) implantable loop recorder   JOINT REPLACEMENT     LAPAROSCOPIC GASTRIC SLEEVE RESECTION WITH HIATAL HERNIA REPAIR  08/20/2014   Procedure: LAPAROSCOPIC GASTRIC SLEEVE RESECTION WITH HIATAL HERNIA  REPAIR AND  UPPER ENDOSCOPY;  Surgeon: Johnathan Hausen, MD;  Location: [facility];  Service: General;;   LEFT HEART CATHETERIZATION WITH CORONARY ANGIOGRAM Bilateral 09/10/2011   Procedure: LEFT HEART CATHETERIZATION WITH CORONARY ANGIOGRAM;  Surgeon: Lorretta Harp, MD;  Location: [facility] C[facility] LAB;  Service: Cardiovascular;  Laterality: Bilateral;   NASAL SINUS SURGERY  1997   REVERSE SHOULDER ARTHROPLASTY Right 05/14/2016   Procedure: RIGHT REVERSE SHOULDER ARTHROPLASTY;  Surgeon: Netta Cedars, MD;  Location: [facility];  Service: Orthopedics;  Laterality: Right;   REVERSE SHOULDER ARTHROPLASTY Left 05/16/2020   Procedure: REVERSE SHOULDER ARTHROPLASTY;  Surgeon: Netta Cedars, MD;  Location: [facility];  Service: Orthopedics;  Laterality: Left;  interscalene exparel   SHOULDER SURGERY Left    tfc wrist Left 1997   thumb replacement Bilateral    one 2009 and one 2014   TOTAL ANKLE REPLACEMENT Right    x 3   TOTAL ANKLE REPLACEMENT Left    TOTAL ANKLE REPLACEMENT     TOTAL HIP ARTHROPLASTY Bilateral    TOTAL KNEE ARTHROPLASTY Bilateral    left 2002, right 2012   TOTAL SHOULDER ARTHROPLASTY Right 05/14/2016   TRANSTHORACIC ECHOCARDIOGRAM  09/09/2011   MILD CONCENTRIC LVH. TRACE MITRAL REGURG. TRACE  TR. MILD AORTIC REGURG.   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      albuterol (PROVENTIL HFA;VENTOLIN HFA) 108 (90 Base) MCG/ACT inhaler Inhale 2 puffs into the lungs every 6 (six) hours as needed for shortness of breath or wheezing.     ALPRAZolam (XANAX) 0.25 MG tablet Take 0.25 mg by mouth daily as needed for anxiety.     BIOTIN PO Take 1 tablet by mouth daily.     calcium carbonate (TUMS - DOSED IN MG ELEMENTAL CALCIUM) 500 MG chewable tablet Chew 1,500 mg by mouth daily as needed for indigestion or heartburn.     cetirizine (ZYRTEC) 10 MG tablet Take 10 mg by mouth as needed for allergies.     Cholecalciferol (DIALYVITE VITAMIN D 5000) 125 MCG (5000 UT) capsule Take 5,000 Units by mouth 2 (two) times daily.     dicyclomine (BENTYL) 10 MG capsule TAKE 1 CAPSULE BY MOUTH TWICE DAILY AS NEEDED 60 capsule 1   diltiazem (CARDIZEM CD) 240 MG 24 hr capsule TAKE 1 CAPSULE BY MOUTH ONCE DAILY . APPOINTMENT REQUIRED FOR FUTURE REFILLS 90 capsule 3   Docusate Sodium (COLACE PO) Take 1 tablet by mouth 2 (two) times daily.     DULoxetine (CYMBALTA) 60 MG capsule Take 60 mg by mouth daily.     esomeprazole (NEXIUM) 40 MG capsule TAKE 1 CAPSULE BY MOUTH TWICE DAILY BEFORE A MEAL 60 capsule 2   famotidine (PEPCID) 20 MG tablet Take 20 mg by mouth daily.     ipratropium (ATROVENT) 0.06 % nasal spray Place 2 sprays into both nostrils daily.     losartan (COZAAR) 50 MG tablet Take 50 mg by mouth daily.     Melatonin 5 MG CAPS Take 5 mg by mouth at bedtime.     methocarbamol (ROBAXIN) 500 MG  tablet Take 1 tablet (500 mg total) by mouth every 8 (eight) hours as needed for muscle spasms. 40 tablet 1   Multiple Vitamin (MULTIVITAMIN WITH MINERALS) TABS tablet Take 1 tablet by mouth daily. Centrum     ondansetron (ZOFRAN-ODT) 8 MG disintegrating tablet DISSOLVE 1 TABLET IN MOUTH EVERY 8 HOURS AS NEEDED FOR NAUSEA 30 tablet 1   simvastatin (ZOCOR) 20 MG tablet Take 1 tablet (20 mg total) by mouth  daily. 30 tablet 11   sucralfate (CARAFATE) 1 g tablet Take 1 tablet (1 g total) by mouth every 6 (six) hours as needed. 60 tablet 2   traMADol (ULTRAM) 50 MG tablet Take 50 mg by mouth every 6 (six) hours as needed for pain.     vitamin B-12 (CYANOCOBALAMIN) 1000 MCG tablet Take 1,000 mcg by mouth daily.     Zoster Vaccine Adjuvanted [facility]) injection Inject into the muscle. 0.5 mL 1   No current facility-administered medications for this visit.    Physical Exam: Vitals:   08/12/21 1459  BP: 122/80  Pulse: 91  SpO2: 92%  Weight: 237 lb (107.5 kg)  Height: 5' 3.5" (1.613 m)    GEN- The patient is well appearing, alert and oriented x 3 today.   Head- normocephalic, atraumatic Eyes-  Sclera clear, conjunctiva pink Ears- hearing intact Oropharynx- clear Lungs- Clear to ausculation bilaterally, normal work of breathing Heart- Regular rate and rhythm, no murmurs, rubs or gallops, PMI not laterally displaced GI- soft, NT, ND, + BS Extremities- no clubbing, cyanosis, or edema  Wt Readings from Last 3 Encounters:  08/12/21 237 lb (107.5 kg)  01/27/21 233 lb 6.4 oz (105.9 kg)  12/19/20 232 lb (105.2 kg)    EKG tracing ordered today is personally reviewed and shows sinus  Assessment and Plan:  Paroxysmal atrial fibrillation Afib is well controlled Chads2vasc score is 3.  We will continue to follow with ILR and consider [place] if her afib increases  2. Obesity Body mass index is 41.32 kg/m. Lifestyle modification again advised   Risks, benefits and potential toxicities for  medications prescribed and/or refilled reviewed with patient today.   Return in a year  Thompson Grayer MD, [facility] 08/12/2021 3:05 PM

## 2021-08-12 NOTE — Patient Instructions (Signed)
Medication Instructions:  Your physician recommends that you continue on your current medications as directed. Please refer to the Current Medication list given to you today.  *If you need a refill on your cardiac medications before your next appointment, please call your pharmacy*   Lab Work: None ordered If you have labs (blood work) drawn today and your tests are completely normal, you will receive your results only by: [place] (if you have MyChart) OR A paper copy in the mail If you have any lab test that is abnormal or we need to change your treatment, we will call you to review the results.   Testing/Procedures: None ordered   Follow-Up: At [facility], you and your health needs are our priority.  As part of our continuing mission to provide you with exceptional heart care, we have created designated Provider Care Teams.  These Care Teams include your primary Cardiologist (physician) and Advanced Practice Providers (APPs -  Physician Assistants and Nurse Practitioners) who all work together to provide you with the care you need, when you need it.  We recommend signing up for the patient portal called "MyChart".  Sign up information is provided on this After Visit Summary.  MyChart is used to connect with patients for Virtual Visits (Telemedicine).  Patients are able to view lab/test results, encounter notes, upcoming appointments, etc.  Non-urgent messages can be sent to your provider as well.   To learn more about what you can do with MyChart, go to [email or web address].    Remote monitoring is used to monitor your Pacemaker or ICD from home. This monitoring reduces the number of office visits required to check your device to one time per year. It allows [place] to keep an eye on the functioning of your device to ensure it is working properly. You are scheduled for a device check from home on 09/07/2021. You may send your transmission at any time that day. If you have a  wireless device, the transmission will be sent automatically. After your physician reviews your transmission, you will receive a postcard with your next transmission date.  Your next appointment:   12 month(s)  The format for your next appointment:   In Person  Provider:   Tommye Standard, PA-C{   Thank you for choosing [facility]!!   Forde Dandy, RN [phone]    Other Instructions   Important Information About Sugar

## 2021-08-14 ENCOUNTER — Other Ambulatory Visit: Payer: Self-pay | Admitting: Gastroenterology

## 2021-08-24 NOTE — Progress Notes (Signed)
Carelink Summary Report / Loop Recorder 

## 2021-08-25 ENCOUNTER — Other Ambulatory Visit: Payer: Self-pay | Admitting: Gastroenterology

## 2021-09-07 ENCOUNTER — Ambulatory Visit (INDEPENDENT_AMBULATORY_CARE_PROVIDER_SITE_OTHER): Payer: Medicare Other

## 2021-09-07 DIAGNOSIS — I480 Paroxysmal atrial fibrillation: Secondary | ICD-10-CM

## 2021-09-10 LAB — CUP PACEART REMOTE DEVICE CHECK
Date Time Interrogation Session: 20230622095817
Implantable Pulse Generator Implant Date: 20211103

## 2021-09-20 ENCOUNTER — Other Ambulatory Visit: Payer: Self-pay | Admitting: Gastroenterology

## 2021-09-28 NOTE — Progress Notes (Signed)
Carelink Summary Report / Loop Recorder 

## 2021-10-12 ENCOUNTER — Ambulatory Visit (INDEPENDENT_AMBULATORY_CARE_PROVIDER_SITE_OTHER): Payer: Medicare Other

## 2021-10-12 DIAGNOSIS — I480 Paroxysmal atrial fibrillation: Secondary | ICD-10-CM

## 2021-10-14 LAB — CUP PACEART REMOTE DEVICE CHECK
Date Time Interrogation Session: 20230725160130
Implantable Pulse Generator Implant Date: 20211103

## 2021-11-15 ENCOUNTER — Other Ambulatory Visit: Payer: Self-pay | Admitting: Gastroenterology

## 2021-11-16 ENCOUNTER — Ambulatory Visit (INDEPENDENT_AMBULATORY_CARE_PROVIDER_SITE_OTHER): Payer: Medicare Other

## 2021-11-16 DIAGNOSIS — I480 Paroxysmal atrial fibrillation: Secondary | ICD-10-CM

## 2021-11-17 LAB — CUP PACEART REMOTE DEVICE CHECK
Date Time Interrogation Session: 20230829081254
Implantable Pulse Generator Implant Date: 20211103

## 2021-11-20 NOTE — Progress Notes (Signed)
Carelink Summary Report / Loop Recorder 

## 2021-11-26 DIAGNOSIS — I10 Essential (primary) hypertension: Secondary | ICD-10-CM | POA: Diagnosis not present

## 2021-11-26 DIAGNOSIS — Z9884 Bariatric surgery status: Secondary | ICD-10-CM | POA: Diagnosis not present

## 2021-11-26 DIAGNOSIS — K582 Mixed irritable bowel syndrome: Secondary | ICD-10-CM | POA: Diagnosis not present

## 2021-11-26 DIAGNOSIS — F418 Other specified anxiety disorders: Secondary | ICD-10-CM | POA: Diagnosis not present

## 2021-11-26 DIAGNOSIS — J45909 Unspecified asthma, uncomplicated: Secondary | ICD-10-CM | POA: Diagnosis not present

## 2021-11-26 DIAGNOSIS — G3184 Mild cognitive impairment, so stated: Secondary | ICD-10-CM | POA: Diagnosis not present

## 2021-11-26 DIAGNOSIS — M8580 Other specified disorders of bone density and structure, unspecified site: Secondary | ICD-10-CM | POA: Diagnosis not present

## 2021-11-26 DIAGNOSIS — F3342 Major depressive disorder, recurrent, in full remission: Secondary | ICD-10-CM | POA: Diagnosis not present

## 2021-11-26 DIAGNOSIS — E785 Hyperlipidemia, unspecified: Secondary | ICD-10-CM | POA: Diagnosis not present

## 2021-11-26 DIAGNOSIS — R7301 Impaired fasting glucose: Secondary | ICD-10-CM | POA: Diagnosis not present

## 2021-11-26 DIAGNOSIS — I700 Atherosclerosis of aorta: Secondary | ICD-10-CM | POA: Diagnosis not present

## 2021-12-11 ENCOUNTER — Other Ambulatory Visit: Payer: Self-pay | Admitting: Gastroenterology

## 2021-12-11 NOTE — Progress Notes (Signed)
Carelink Summary Report / Loop Recorder 

## 2021-12-18 DIAGNOSIS — J45901 Unspecified asthma with (acute) exacerbation: Secondary | ICD-10-CM | POA: Diagnosis not present

## 2021-12-18 DIAGNOSIS — R0981 Nasal congestion: Secondary | ICD-10-CM | POA: Diagnosis not present

## 2021-12-18 DIAGNOSIS — Z1152 Encounter for screening for COVID-19: Secondary | ICD-10-CM | POA: Diagnosis not present

## 2021-12-18 DIAGNOSIS — L299 Pruritus, unspecified: Secondary | ICD-10-CM | POA: Diagnosis not present

## 2021-12-21 ENCOUNTER — Ambulatory Visit (INDEPENDENT_AMBULATORY_CARE_PROVIDER_SITE_OTHER): Payer: Medicare Other

## 2021-12-21 DIAGNOSIS — I480 Paroxysmal atrial fibrillation: Secondary | ICD-10-CM

## 2021-12-23 LAB — CUP PACEART REMOTE DEVICE CHECK
Date Time Interrogation Session: 20231004065357
Implantable Pulse Generator Implant Date: 20211103

## 2022-01-06 NOTE — Progress Notes (Signed)
Carelink Summary Report / Loop Recorder 

## 2022-01-11 ENCOUNTER — Other Ambulatory Visit: Payer: Self-pay | Admitting: Gastroenterology

## 2022-01-25 ENCOUNTER — Ambulatory Visit (INDEPENDENT_AMBULATORY_CARE_PROVIDER_SITE_OTHER): Payer: Medicare Other

## 2022-01-25 DIAGNOSIS — I480 Paroxysmal atrial fibrillation: Secondary | ICD-10-CM

## 2022-01-26 ENCOUNTER — Encounter: Payer: Self-pay | Admitting: Gastroenterology

## 2022-01-26 ENCOUNTER — Ambulatory Visit (INDEPENDENT_AMBULATORY_CARE_PROVIDER_SITE_OTHER): Payer: Medicare Other | Admitting: Gastroenterology

## 2022-01-26 ENCOUNTER — Other Ambulatory Visit: Payer: Medicare Other

## 2022-01-26 VITALS — BP 120/60 | HR 90 | Ht 64.5 in | Wt 236.0 lb

## 2022-01-26 DIAGNOSIS — K580 Irritable bowel syndrome with diarrhea: Secondary | ICD-10-CM

## 2022-01-26 DIAGNOSIS — K915 Postcholecystectomy syndrome: Secondary | ICD-10-CM | POA: Diagnosis not present

## 2022-01-26 DIAGNOSIS — G894 Chronic pain syndrome: Secondary | ICD-10-CM | POA: Diagnosis not present

## 2022-01-26 MED ORDER — COLESTIPOL HCL 1 G PO TABS: ORAL_TABLET | ORAL | 2 refills | Status: DC

## 2022-01-26 MED ORDER — DICYCLOMINE HCL 10 MG PO CAPS: 10.0000 mg | ORAL_CAPSULE | Freq: Two times a day (BID) | ORAL | 3 refills | Status: DC

## 2022-01-26 MED ORDER — LIDOCAINE 5 % EX PTCH: 1.0000 | MEDICATED_PATCH | CUTANEOUS | 0 refills | Status: DC

## 2022-01-26 MED ORDER — ONDANSETRON 8 MG PO TBDP: 8.0000 mg | ORAL_TABLET | ORAL | 1 refills | Status: DC

## 2022-01-26 MED ORDER — IBGARD 90 MG PO CPCR: ORAL_CAPSULE | ORAL | 0 refills | Status: DC

## 2022-01-26 NOTE — Progress Notes (Signed)
HPI :  76 year old female here for a follow-up visit.  I last saw her in September 2022.  Recall she has a history of suspected diverticular bleeding in the setting of anticoagulation, GERD, dysphagia, chronic abdominal pain with altered bowel habits, suspected IBS.  She states she is not doing too well today and has not been feeling well for several months.  She has multiple complaints that are bothering her.  Her main complaint is that she is having urgent loose stools after she eats.  She has been going about 4 times in the morning and sometimes later in the day, BM with most meals.  She has a hard time eating out.  Sometimes the urgency is so bad she has had incontinence.  She will really have constipation.  About 90% of the time her stools are loose.  She is been trying to use fiber supplement once daily.  She had a satisfaction a few weeks ago and took some antibiotics which she states "tore up" her stomach.  She states for the past 3 weeks her diarrhea is been worse than usual.  She denies any fevers etc.  I looked through her prior regimen and we had tried her on Colestid in the past given her cholecystectomy state and this made her constipated at the time and she stopped it.  Over the years she has had abdominal cramping with her bowel habits that have caused discomfort.  She also has a chronic soreness in her right upper quadrant along through her right flank.  She reminds me she has had multiple back surgeries dating back to 76 years old.  She has chronic pain in her right upper side all the time, she has soreness there.  She states it is more painful when she has a "attack" when her bowels are worse.  Symptoms are similar to what they have been in the past.  Recall she had endoscopy, colonoscopy, imaging studies with CT scan etc. in the past.  At one point time I placed her on Cymbalta, and we took off Zoloft.  She has been on stable dosing of Cymbalta and the last time I saw her she was doing  well on this.  She takes Bentyl as needed for flares of symptoms but does not take routinely.  She is also using IBgard once daily in the morning.  She initially thought the IBgard helped but she is not so sure anymore.  She takes Zofran as needed for nausea but she does not vomit much.  She also uses a probiotic every morning.  Recall she has had celiac testing in the past which has been negative as well as fecal pancreatic elastase.  Her symptoms appear chronic and bothering her more so than usual but similar in characteristics of what pain she has had in years past.  Recall she has had multiple endoscopies, barium swallow, gastric emptying study, pH test remotely.  Gastric emptying and pH test on a long time ago.          Prior workup: EGD 04/15/2017 - sleeve gastrectomy, otherwise normal exam - biopsies taken to rule out H pylori -    Colonoscopy 04/15/2017 - normal TI, multiple diverticuli, normal exam otherwise - biopsies taken to rule out microscopic colitis   EGD 04/23/19 -  - A 3 cm hiatal hernia was present. - The exam of the esophagus was otherwise normal. - A guidewire was placed and the scope was withdrawn. Dilation was performed in the entire esophagus  with a Savary dilator with mild resistance at 17 mm and 18 mm. Relook endoscopy showed no mucosal wrents. - Evidence of a sleeve gastrectomy was found in the cardia / fundus. This was characterized by healthy appearing mucosa. - The exam of the stomach was otherwise normal. - The duodenal bulb and second portion of the duodenum were normal.     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     CT scan 06/19/19 - IMPRESSION: 1. No acute findings or explanation for the patient's symptoms. 2. Moderate size hiatal hernia status post gastric sleeve resection. 3. Extensive colonic diverticulosis without evidence of acute inflammation. 4. Stable biliary dilatation, likely  physiologic post cholecystectomy. 5. Aortic Atherosclerosis (ICD10-I70.0).   Colonoscopy 10/18/19 - The perianal and digital rectal examinations were normal. - The terminal ileum appeared normal. - Multiple medium-mouthed diverticula were found in the entire colon. Lavaged extensively, no stigmata for bleeding. - Three sessile polyps were found in the transverse colon. The polyps were 2 to 3 mm in size. These polyps were removed with a cold biopsy forceps. Resection and retrieval were complete. - Internal hemorrhoids were found during retroflexion. The hemorrhoids were small. - The exam was otherwise without abnormality. Brown stool noted throughout the colon - no blood noted anywhere.   Suspected diverticular bleed    CT renal stone study 08/07/20: IMPRESSION: 1. Negative for hydronephrosis or ureteral stone. 2. Extensive colon diverticular disease without acute inflammatory change 3. Small hiatal hernia with evidence of prior gastric sleeve surgery 4. Post cholecystectomy with stable extrahepatic biliary dilatation   CT chest 09/25/20 - IMPRESSION: 1. No acute chest abnormality. No evidence for acute inflammation or consolidation. 2. Development of a moderate sized hiatal hernia. Postsurgical changes at the GE junction and stomach. 3. Areas of scarring or mild atelectasis at the lung bases, left side greater than right. 4. Aortic Atherosclerosis (ICD10-I70.0). Coronary artery calcifications. 5. Probable left renal cyst     Past Medical History:  Diagnosis Date   Allergy    Anemia    Antral polyp    benign   Anxiety    Arthritis    Asthma    B12 deficiency    Back pain    Chest pain    Constipation    Depression    Diverticulosis    Gallbladder problem    GERD (gastroesophageal reflux disease) 09/09/2011   History of transfusion    HTN (hypertension) 09/09/2011   Hyperlipemia 09/09/2011   Hypertension    IBS (irritable bowel syndrome)    Internal hemorrhoid     Joint pain    Obesity    Osteoarthritis    Paroxysmal atrial fibrillation (HCC)    Pneumonia    PONV (postoperative nausea and vomiting)    with big surgeries, none with endos etc, with colonoscopy- 5-6 years ago could not swallow    Shortness of breath    Sleep apnea    borderline per patient no cpap    SOB (shortness of breath) on exertion    Swallowing difficulty    Tachycardia    Unstable angina ([place]) 09/09/2011   Vitamin D deficiency    Vocal cord dysfunction 2013   swelling     Past Surgical History:  Procedure Laterality Date   ABDOMINAL HYSTERECTOMY  1988   ANKLE FUSION Right    x 2   BACK SURGERY  [phone]   3 ruptured discs, lower back   BILATERAL ANTERIOR TOTAL HIP  ARTHROPLASTY Bilateral 11/12/2014   Procedure: BILATERAL ANTERIOR TOTAL HIP ARTHROPLASTY;  Surgeon: Paralee Cancel, MD;  Location: [facility];  Service: Orthopedics;  Laterality: Bilateral;   BRAVO [place] STUDY [name] 03/06/2013   Procedure: BRAVO [place] STUDY;  Surgeon: Inda Castle, MD;  Location: [facility];  Service: Endoscopy;  Laterality: [name];   CARDIAC CATHETERIZATION  2013   NORMAL   CHOLECYSTECTOMY     COLONOSCOPY     ELBOW SURGERY Left    removed bone chip   ESOPHAGOGASTRODUODENOSCOPY [name] 03/06/2013   Procedure: ESOPHAGOGASTRODUODENOSCOPY (EGD);  Surgeon: Inda Castle, MD;  Location: [facility];  Service: Endoscopy;  Laterality: [name];   FOOT OSTEOTOMY W/ PLANTAR FASCIA RELEASE Left    ILIOTIBIAL BAND RELEASE Right 1997   implantable loop recorder placement  01/23/2020   Medtronic Reveal Linq model LNQ 22 (RLB [id number]) implantable loop recorder   JOINT REPLACEMENT     LAPAROSCOPIC GASTRIC SLEEVE RESECTION WITH HIATAL HERNIA REPAIR  08/20/2014   Procedure: LAPAROSCOPIC GASTRIC SLEEVE RESECTION WITH HIATAL HERNIA  REPAIR AND  UPPER ENDOSCOPY;  Surgeon: Johnathan Hausen, MD;  Location: [facility];  Service: General;;   LEFT HEART CATHETERIZATION WITH CORONARY ANGIOGRAM Bilateral 09/10/2011   Procedure:  LEFT HEART CATHETERIZATION WITH CORONARY ANGIOGRAM;  Surgeon: Lorretta Harp, MD;  Location: [facility];  Service: Cardiovascular;  Laterality: Bilateral;   NASAL SINUS SURGERY  1997   REVERSE SHOULDER ARTHROPLASTY Right 05/14/2016   Procedure: RIGHT REVERSE SHOULDER ARTHROPLASTY;  Surgeon: Netta Cedars, MD;  Location: [facility];  Service: Orthopedics;  Laterality: Right;   REVERSE SHOULDER ARTHROPLASTY Left 05/16/2020   Procedure: REVERSE SHOULDER ARTHROPLASTY;  Surgeon: Netta Cedars, MD;  Location: [facility];  Service: Orthopedics;  Laterality: Left;  interscalene exparel   SHOULDER SURGERY Left    tfc wrist Left 1997   thumb replacement Bilateral    one 2009 and one 2014   TOTAL ANKLE REPLACEMENT Right    x 3   TOTAL ANKLE REPLACEMENT Left    TOTAL ANKLE REPLACEMENT     TOTAL HIP ARTHROPLASTY Bilateral    TOTAL KNEE ARTHROPLASTY Bilateral    left 2002, right 2012   TOTAL SHOULDER ARTHROPLASTY Right 05/14/2016   TRANSTHORACIC ECHOCARDIOGRAM  09/09/2011   MILD CONCENTRIC LVH. TRACE MITRAL REGURG. TRACE TR. MILD AORTIC REGURG.   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   UPPER GI ENDOSCOPY  08/20/2014   Procedure: UPPER GI ENDOSCOPY;  Surgeon: Johnathan Hausen, MD;  Location: [facility];  Service: General;;   WRIST SURGERY Left 1997, 2009    Family History  Problem Relation Age of Onset   Hypertension Mother    Diabetes Mother    Arthritis Mother    Esophageal cancer Mother    Cancer Mother    Esophageal cancer Father 21   Cancer Father    Alcoholism Father    Other Sister        kidney mass removed   Hypertension Sister    Stroke Sister        died in 2018-12-15   Heart disease Maternal Grandmother    Stroke Paternal Grandmother    Coronary artery disease Neg Hx    Colon cancer Neg Hx    Liver disease Neg Hx    Kidney disease Neg Hx    Rectal cancer Neg Hx    Stomach cancer Neg Hx    Social History   Tobacco Use   Smoking status: Never   Smokeless tobacco: Never  Vaping Use   Vaping Use: Never used  Substance Use Topics   Alcohol use: No   Drug use: No   Current Outpatient Medications  Medication Sig Dispense Refill   acetaminophen (TYLENOL) 500 MG tablet Take 1,000 mg by mouth every 6 (six) hours as needed (for pain.).      albuterol (PROVENTIL HFA;VENTOLIN HFA) 108 (90 Base) MCG/ACT inhaler Inhale 2 puffs into the lungs every 6 (six) hours as needed for shortness of breath or wheezing.     ALPRAZolam (XANAX) 0.25 MG tablet Take 0.25 mg by mouth daily as needed for anxiety.     BIOTIN PO Take 1 tablet by mouth daily.     calcium carbonate (TUMS - DOSED IN MG ELEMENTAL CALCIUM) 500 MG chewable tablet Chew 1,500 mg by mouth daily as needed for indigestion or heartburn.     cetirizine (ZYRTEC) 10 MG tablet Take 10 mg by mouth as needed for allergies.     Cholecalciferol (DIALYVITE VITAMIN D 5000) 125 MCG (5000 UT) capsule Take 5,000 Units by mouth 2 (two) times daily.     dicyclomine (BENTYL) 10 MG capsule Take 1 capsule (10 mg total) by mouth 2 (two) times daily as needed for spasms. Please schedule a yearly follow up at your convenience. Thank you. 60 capsule 1   diltiazem (CARDIZEM CD) 240 MG 24 hr capsule TAKE 1 CAPSULE BY MOUTH ONCE DAILY . APPOINTMENT REQUIRED FOR FUTURE REFILLS 90 capsule 3   Docusate Sodium (COLACE PO) Take 1 tablet by mouth 2 (two) times daily.     DULoxetine (CYMBALTA) 60 MG capsule Take 60 mg by mouth daily.     esomeprazole (NEXIUM) 40 MG capsule TAKE 1 CAPSULE BY MOUTH TWICE DAILY BEFORE A MEAL 60 capsule 2   famotidine (PEPCID) 20 MG tablet Take 20 mg by mouth daily.     ipratropium (ATROVENT) 0.06 % nasal spray Place 2 sprays into both nostrils daily.     losartan (COZAAR) 50 MG tablet Take 50 mg by mouth daily.     Melatonin 5 MG CAPS Take 5 mg by mouth at bedtime.     Multiple Vitamin (MULTIVITAMIN WITH MINERALS) TABS tablet Take 1 tablet by mouth daily. Centrum     ondansetron (ZOFRAN-ODT) 8 MG disintegrating tablet  DISSOLVE 1 TABLET IN MOUTH EVERY 8 HOURS AS NEEDED FOR NAUSEA 30 tablet 1   simvastatin (ZOCOR) 20 MG tablet Take 1 tablet (20 mg total) by mouth daily. 30 tablet 11   sucralfate (CARAFATE) 1 g tablet Take 1 tablet (1 g total) by mouth every 6 (six) hours as needed. 60 tablet 2   traMADol (ULTRAM) 50 MG tablet Take 50 mg by mouth every 6 (six) hours as needed for pain.     vitamin B-12 (CYANOCOBALAMIN) 1000 MCG tablet Take 1,000 mcg by mouth daily.     Zoster Vaccine Adjuvanted [facility]) injection Inject into the muscle. 0.5 mL 1   No current facility-administered medications for this visit.   Allergies  Allergen Reactions   Ativan [Lorazepam] Other (See Comments)    Agitation, aggressive actions, significant mental changes   Cefdinir Hives    headache   Buprenorphine Hcl Itching and Rash     Review of Systems: All systems reviewed and negative except where noted in HPI.   Physical Exam: BP 120/60   Pulse 90   Ht 5' 4.5" (1.638 m)   Wt 236 lb (107 kg)   BMI 39.88 kg/m  Constitutional: Pleasant,well-developed, female in no acute  distress. Abdominal: Soft, nondistended, tenderness to palpation in the RUQ / mid abdomen - positive [place]. There are no masses palpable.  Neurological: Alert and oriented to person place and time. Psychiatric: Normal mood and affect. Behavior is normal.   ASSESSMENT: 76 y.o. female here for assessment of the following  1. Chronic pain syndrome   2. Irritable bowel syndrome with diarrhea   3. Post-cholecystectomy syndrome    Patient with chronic abdominal pain as outlined above along with altered bowel habits over the years.  She has gone back and forth between loose stools and constipation although much more loose recently.  She also had a antibiotic course in recent weeks for sinusitis.  I reviewed her prior work-up and history with her.  I think she very likely has functional bowel disorder/IBS at baseline along with chronic  musculoskeletal/neuropathic pain in her right upper quadrant.  Given her recent antibiotic use, I do think we need to rule out C. difficile however as a cause of her worsening loose stools.  She will go to the lab for C. difficile stool test.  Discussed options to treat her loose stools which is her main complaint today.  Colestid clearly worked for her in the past, suspect component of bile salt diarrhea.  We will add back Colestid 1 g once daily in the morning to see how this affects her day.  If this works she can increase as needed to twice daily.  If she finds it is too constipating she can back off and take it less frequently.  I think we can stop the probiotic as it has not been helping her.  Bentyl does help her when she takes it, she can take this twice daily scheduled to try to prevent episodes.  If IBgard helps her she can continue that as well.  She should continue Cymbalta.  Zofran helps with her nausea, she can also take that daily and titrate up as needed to minimize her symptoms and that may help reduce her diarrhea as well.  As for her right upper quadrant/right side pain, this appears very much musculoskeletal to me.  She has Flexeril at home which helps when she takes it, she continue that however caution for use of using this daily as it could cause drowsiness.  Recommend she try some lidocaine patches over-the-counter to see if that will help.  If her pain really bothers her there and persists over time we may need to send her to see pain management to consider trigger point injection.  Otherwise spent a lot of time reassuring her that I do not think we are missing anything bad at this point given chronicity of symptoms and her work-up today.  PLAN: - stop probiotic - C diff PCR stool test - start colestid 1gm once daily - titrate up as needed. Counseled it could cause constipation - increase bentyl to BID scheduled dosing - continue IB gard PRN - continue Cymbalta - take Zofran q  AM and titrate up as needed - trial of lidocaine patches OTC PRN - continue flexeril PRN - may need to pain management pending her course - contact me in a few weeks with update on her progress  Jolly Mango, MD [facility]

## 2022-01-26 NOTE — Patient Instructions (Addendum)
_______________________________________________________  If you are age 75 or older, your body mass index should be between 23-30. Your Body mass index is 39.88 kg/m. If this is out of the aforementioned range listed, please consider follow up with your Primary Care Provider.  If you are age 80 or younger, your body mass index should be between 19-25. Your Body mass index is 39.88 kg/m. If this is out of the aformentioned range listed, please consider follow up with your Primary Care Provider.   ________________________________________________________  The [facility] providers would like to encourage you to use [facility] to communicate with providers for non-urgent requests or questions.  Due to long hold times on the telephone, sending your provider a message by [facility] may be a faster and more efficient way to get a response.  Please allow 48 business hours for a response.  Please remember that this is for non-urgent requests.  _______________________________________________________  Please go to the lab in the basement of our building to have lab work done as you leave today. Hit "B" for basement when you get on the elevator.  When the doors open the lab is on your left.  We will call you with the results. Thank you.  Stop probiotic  We have sent the following medications to your pharmacy for you to pick up at your convenience:  Colestid 1 g: Take once daily and titrate as needed  Take Bentyl twice a day Continue IBgard as needed Continue Cymbalta  Take zofran every morning and titrate as needed.  Continue Flexeril  Please purchase the following medications over the counter and take as directed: Lidocaine patches  Thank you for entrusting me with your care and for choosing  HealthCare, Dr. Inman Cellar

## 2022-01-27 LAB — CUP PACEART REMOTE DEVICE CHECK
Date Time Interrogation Session: 20231103230515
Implantable Pulse Generator Implant Date: 20211103

## 2022-01-28 DIAGNOSIS — Z23 Encounter for immunization: Secondary | ICD-10-CM | POA: Diagnosis not present

## 2022-03-01 ENCOUNTER — Ambulatory Visit (INDEPENDENT_AMBULATORY_CARE_PROVIDER_SITE_OTHER): Payer: Medicare Other

## 2022-03-01 DIAGNOSIS — I480 Paroxysmal atrial fibrillation: Secondary | ICD-10-CM

## 2022-03-02 LAB — CUP PACEART REMOTE DEVICE CHECK
Date Time Interrogation Session: 20231210230238
Implantable Pulse Generator Implant Date: 20211103

## 2022-03-03 NOTE — Progress Notes (Signed)
Carelink Summary Report / Loop Recorder 

## 2022-03-09 ENCOUNTER — Other Ambulatory Visit: Payer: Self-pay | Admitting: Gastroenterology

## 2022-03-17 ENCOUNTER — Other Ambulatory Visit: Payer: Self-pay | Admitting: Gastroenterology

## 2022-04-05 ENCOUNTER — Ambulatory Visit (INDEPENDENT_AMBULATORY_CARE_PROVIDER_SITE_OTHER): Payer: Medicare Other

## 2022-04-05 DIAGNOSIS — I480 Paroxysmal atrial fibrillation: Secondary | ICD-10-CM

## 2022-04-07 LAB — CUP PACEART REMOTE DEVICE CHECK
Date Time Interrogation Session: 20240112230609
Implantable Pulse Generator Implant Date: 20211103

## 2022-04-09 NOTE — Progress Notes (Signed)
Carelink Summary Report / Loop Recorder

## 2022-04-13 DIAGNOSIS — H532 Diplopia: Secondary | ICD-10-CM | POA: Diagnosis not present

## 2022-04-13 DIAGNOSIS — H52203 Unspecified astigmatism, bilateral: Secondary | ICD-10-CM | POA: Diagnosis not present

## 2022-04-29 ENCOUNTER — Encounter (HOSPITAL_COMMUNITY): Payer: Self-pay | Admitting: *Deleted

## 2022-04-29 DIAGNOSIS — Z1231 Encounter for screening mammogram for malignant neoplasm of breast: Secondary | ICD-10-CM | POA: Diagnosis not present

## 2022-04-30 ENCOUNTER — Other Ambulatory Visit: Payer: Self-pay | Admitting: Gastroenterology

## 2022-05-05 ENCOUNTER — Other Ambulatory Visit (HOSPITAL_COMMUNITY): Payer: Self-pay

## 2022-05-05 ENCOUNTER — Telehealth: Payer: Self-pay | Admitting: Pharmacy Technician

## 2022-05-05 NOTE — Telephone Encounter (Signed)
Patient Advocate Encounter  Prior Authorization for ESOMEPRAZOLE 400MG has been approved.    PA# [id number] Effective dates: 1.1.24 through 12.31.24   Received notification from [facility] that prior authorization for ESOMEPRAZOLE 40MG is required.   PA submitted on 2.14.24 Key [id number] Status is pending

## 2022-05-06 ENCOUNTER — Ambulatory Visit (INDEPENDENT_AMBULATORY_CARE_PROVIDER_SITE_OTHER): Payer: Medicare Other

## 2022-05-06 DIAGNOSIS — I480 Paroxysmal atrial fibrillation: Secondary | ICD-10-CM

## 2022-05-07 LAB — CUP PACEART REMOTE DEVICE CHECK
Date Time Interrogation Session: 20240214230529
Implantable Pulse Generator Implant Date: 20211103

## 2022-05-09 ENCOUNTER — Other Ambulatory Visit: Payer: Self-pay | Admitting: Gastroenterology

## 2022-05-26 NOTE — Progress Notes (Signed)
Carelink Summary Report / Loop Recorder 

## 2022-06-03 NOTE — Progress Notes (Signed)
Carelink Summary Report / Loop Recorder 

## 2022-06-08 ENCOUNTER — Ambulatory Visit (INDEPENDENT_AMBULATORY_CARE_PROVIDER_SITE_OTHER): Payer: Medicare Other

## 2022-06-08 DIAGNOSIS — I480 Paroxysmal atrial fibrillation: Secondary | ICD-10-CM | POA: Diagnosis not present

## 2022-06-09 LAB — CUP PACEART REMOTE DEVICE CHECK
Date Time Interrogation Session: 20240318230419
Implantable Pulse Generator Implant Date: 20211103

## 2022-06-10 DIAGNOSIS — H16202 Unspecified keratoconjunctivitis, left eye: Secondary | ICD-10-CM | POA: Diagnosis not present

## 2022-06-10 DIAGNOSIS — H04122 Dry eye syndrome of left lacrimal gland: Secondary | ICD-10-CM | POA: Diagnosis not present

## 2022-06-21 DIAGNOSIS — D649 Anemia, unspecified: Secondary | ICD-10-CM | POA: Diagnosis not present

## 2022-06-21 DIAGNOSIS — R399 Unspecified symptoms and signs involving the genitourinary system: Secondary | ICD-10-CM | POA: Diagnosis not present

## 2022-06-21 DIAGNOSIS — I10 Essential (primary) hypertension: Secondary | ICD-10-CM | POA: Diagnosis not present

## 2022-06-21 DIAGNOSIS — E559 Vitamin D deficiency, unspecified: Secondary | ICD-10-CM | POA: Diagnosis not present

## 2022-06-21 DIAGNOSIS — R7301 Impaired fasting glucose: Secondary | ICD-10-CM | POA: Diagnosis not present

## 2022-06-21 DIAGNOSIS — E785 Hyperlipidemia, unspecified: Secondary | ICD-10-CM | POA: Diagnosis not present

## 2022-06-21 DIAGNOSIS — N390 Urinary tract infection, site not specified: Secondary | ICD-10-CM | POA: Diagnosis not present

## 2022-06-28 ENCOUNTER — Other Ambulatory Visit: Payer: Self-pay | Admitting: Gastroenterology

## 2022-06-30 DIAGNOSIS — Z23 Encounter for immunization: Secondary | ICD-10-CM | POA: Diagnosis not present

## 2022-06-30 DIAGNOSIS — G4700 Insomnia, unspecified: Secondary | ICD-10-CM | POA: Diagnosis not present

## 2022-06-30 DIAGNOSIS — Z86010 Personal history of colonic polyps: Secondary | ICD-10-CM | POA: Diagnosis not present

## 2022-06-30 DIAGNOSIS — K219 Gastro-esophageal reflux disease without esophagitis: Secondary | ICD-10-CM | POA: Diagnosis not present

## 2022-06-30 DIAGNOSIS — Z1331 Encounter for screening for depression: Secondary | ICD-10-CM | POA: Diagnosis not present

## 2022-06-30 DIAGNOSIS — Z0000 Encounter for general adult medical examination without abnormal findings: Secondary | ICD-10-CM | POA: Diagnosis not present

## 2022-06-30 DIAGNOSIS — J383 Other diseases of vocal cords: Secondary | ICD-10-CM | POA: Diagnosis not present

## 2022-06-30 DIAGNOSIS — I700 Atherosclerosis of aorta: Secondary | ICD-10-CM | POA: Diagnosis not present

## 2022-06-30 DIAGNOSIS — E785 Hyperlipidemia, unspecified: Secondary | ICD-10-CM | POA: Diagnosis not present

## 2022-06-30 DIAGNOSIS — R7301 Impaired fasting glucose: Secondary | ICD-10-CM | POA: Diagnosis not present

## 2022-06-30 DIAGNOSIS — R053 Chronic cough: Secondary | ICD-10-CM | POA: Diagnosis not present

## 2022-06-30 DIAGNOSIS — Z9884 Bariatric surgery status: Secondary | ICD-10-CM | POA: Diagnosis not present

## 2022-06-30 DIAGNOSIS — I10 Essential (primary) hypertension: Secondary | ICD-10-CM | POA: Diagnosis not present

## 2022-06-30 DIAGNOSIS — Z1339 Encounter for screening examination for other mental health and behavioral disorders: Secondary | ICD-10-CM | POA: Diagnosis not present

## 2022-07-05 ENCOUNTER — Other Ambulatory Visit: Payer: Self-pay | Admitting: Gastroenterology

## 2022-07-06 ENCOUNTER — Telehealth: Payer: Self-pay | Admitting: Pharmacy Technician

## 2022-07-06 NOTE — Telephone Encounter (Signed)
Patient Advocate Encounter  Prior Authorization for DICYLOMINE  has been approved.    PA# [id number] Effective dates: 1.1.24 through 4.16.25

## 2022-07-06 NOTE — Telephone Encounter (Signed)
Patient Advocate Encounter  Received notification from [facility] that prior authorization for DICYCLOMINE  is required.   PA submitted on 4.16.24 Key [id number] Status is pending

## 2022-07-12 ENCOUNTER — Ambulatory Visit (INDEPENDENT_AMBULATORY_CARE_PROVIDER_SITE_OTHER): Payer: Medicare Other

## 2022-07-12 DIAGNOSIS — I480 Paroxysmal atrial fibrillation: Secondary | ICD-10-CM | POA: Diagnosis not present

## 2022-07-13 LAB — CUP PACEART REMOTE DEVICE CHECK
Date Time Interrogation Session: 20240420230239
Implantable Pulse Generator Implant Date: 20211103

## 2022-07-20 NOTE — Progress Notes (Signed)
Carelink Summary Report / Loop Recorder 

## 2022-08-11 DIAGNOSIS — M5136 Other intervertebral disc degeneration, lumbar region: Secondary | ICD-10-CM | POA: Diagnosis not present

## 2022-08-11 DIAGNOSIS — M48062 Spinal stenosis, lumbar region with neurogenic claudication: Secondary | ICD-10-CM | POA: Diagnosis not present

## 2022-08-11 DIAGNOSIS — Z96643 Presence of artificial hip joint, bilateral: Secondary | ICD-10-CM | POA: Diagnosis not present

## 2022-08-13 ENCOUNTER — Encounter: Payer: Medicare Other | Admitting: Physician Assistant

## 2022-08-13 LAB — CUP PACEART REMOTE DEVICE CHECK
Date Time Interrogation Session: 20240523230114
Implantable Pulse Generator Implant Date: 20211103

## 2022-08-17 ENCOUNTER — Ambulatory Visit (INDEPENDENT_AMBULATORY_CARE_PROVIDER_SITE_OTHER): Payer: Medicare Other

## 2022-08-17 DIAGNOSIS — I480 Paroxysmal atrial fibrillation: Secondary | ICD-10-CM

## 2022-08-18 NOTE — Progress Notes (Signed)
Carelink Summary Report / Loop Recorder 

## 2022-08-20 ENCOUNTER — Other Ambulatory Visit: Payer: Self-pay | Admitting: Gastroenterology

## 2022-08-20 ENCOUNTER — Other Ambulatory Visit: Payer: Self-pay | Admitting: Cardiovascular Disease

## 2022-08-26 ENCOUNTER — Ambulatory Visit: Payer: Medicare Other | Attending: Physician Assistant | Admitting: Physician Assistant

## 2022-08-26 ENCOUNTER — Encounter: Payer: Self-pay | Admitting: Physician Assistant

## 2022-08-26 VITALS — BP 108/72 | HR 93 | Ht 64.5 in | Wt 236.2 lb

## 2022-08-26 DIAGNOSIS — Z4509 Encounter for adjustment and management of other cardiac device: Secondary | ICD-10-CM | POA: Diagnosis not present

## 2022-08-26 DIAGNOSIS — I480 Paroxysmal atrial fibrillation: Secondary | ICD-10-CM

## 2022-08-26 DIAGNOSIS — I10 Essential (primary) hypertension: Secondary | ICD-10-CM | POA: Diagnosis not present

## 2022-08-26 LAB — CUP PACEART INCLINIC DEVICE CHECK
Date Time Interrogation Session: 20240606192347
Implantable Pulse Generator Implant Date: 20211103

## 2022-08-26 NOTE — Patient Instructions (Signed)
Medication Instructions:   Your physician recommends that you continue on your current medications as directed. Please refer to the Current Medication list given to you today.  *If you need a refill on your cardiac medications before your next appointment, please call your pharmacy*   Lab Work: NONE ORDERED  TODAY   If you have labs (blood work) drawn today and your tests are completely normal, you will receive your results only by: MyChart Message (if you have MyChart) OR A paper copy in the mail If you have any lab test that is abnormal or we need to change your treatment, we will call you to review the results.   Testing/Procedures: NONE ORDERED  TODAY     Follow-Up: At [facility], you and your health needs are our priority.  As part of our continuing mission to provide you with exceptional heart care, we have created designated Provider Care Teams.  These Care Teams include your primary Cardiologist (physician) and Advanced Practice Providers (APPs -  Physician Assistants and Nurse Practitioners) who all work together to provide you with the care you need, when you need it.  We recommend signing up for the patient portal called "MyChart".  Sign up information is provided on this After Visit Summary.  MyChart is used to connect with patients for Virtual Visits (Telemedicine).  Patients are able to view lab/test results, encounter notes, upcoming appointments, etc.  Non-urgent messages can be sent to your provider as well.   To learn more about what you can do with MyChart, go to [email or web address].    Your next appointment:   1 year(s)  Provider:   Augustus Mealor, MD    Other Instructions  

## 2022-08-26 NOTE — Progress Notes (Signed)
Cardiology Office Note Date:  08/26/2022  Patient ID:  [name] June 14, 1945, MRN [id number] PCP:  Cleatis Polka., MD  Electrophysiologist: Dr. Johney Frame    Chief Complaint: annual visit  History of Present Illness: [name] is a 77 y.o. female with history of HTN, HLD, AFib, obesity (hx of bariatric surgery/gastric sleeve)  She saw Dr. Johney Frame 08/12/21, doing well, described AFib as well controlled with plans to revisit OAC tx if burden increased. Advised weight loss  Off OAC 2/2 LGIB (while on Eliquis)  TODAY She is accompanied by her husband. She reports that remotely during a colonoscopy she was told she had Afib, to the best of her knowledge, she may have been seen once since then, but isn't sure. She does not think or know if she would know if she had Afib by symptoms.  She denies CP She has vocal cord dysplasia that she thinks sometimes causes some mild breathlessness, no overt SOB, no DOE No dizzy spells, near syncope or syncope.     Device information MDT LINQ II implanted 01/23/20, AFib survellience  AFib diagnosed 2015 I do not find any AAD hx  Past Medical History:  Diagnosis Date   Allergy    Anemia    Antral polyp    benign   Anxiety    Arthritis    Asthma    B12 deficiency    Back pain    Chest pain    Constipation    Depression    Diverticulosis    Gallbladder problem    GERD (gastroesophageal reflux disease) 09/09/2011   History of transfusion    HTN (hypertension) 09/09/2011   Hyperlipemia 09/09/2011   Hypertension    IBS (irritable bowel syndrome)    Internal hemorrhoid    Joint pain    Obesity    Osteoarthritis    Paroxysmal atrial fibrillation (HCC)    Pneumonia    PONV (postoperative nausea and vomiting)    with big surgeries, none with endos etc, with colonoscopy- 5-6 years ago could not swallow    Shortness of breath    Sleep apnea    borderline per patient no cpap    SOB (shortness of breath) on exertion     Swallowing difficulty    Tachycardia    Unstable angina (HCC) 09/09/2011   Vitamin D deficiency    Vocal cord dysfunction 2013   swelling    Past Surgical History:  Procedure Laterality Date   ABDOMINAL HYSTERECTOMY  1988   ANKLE FUSION Right    x 2   BACK SURGERY  [phone]   3 ruptured discs, lower back   BILATERAL ANTERIOR TOTAL HIP ARTHROPLASTY Bilateral 11/12/2014   Procedure: BILATERAL ANTERIOR TOTAL HIP ARTHROPLASTY;  Surgeon: Durene Romans, MD;  Location: [facility];  Service: Orthopedics;  Laterality: Bilateral;   BRAVO PH STUDY [name] 03/06/2013   Procedure: BRAVO PH STUDY;  Surgeon: Louis Meckel, MD;  Location: [facility];  Service: Endoscopy;  Laterality: [name];   CARDIAC CATHETERIZATION  2013   NORMAL   CHOLECYSTECTOMY     COLONOSCOPY     ELBOW SURGERY Left    removed bone chip   ESOPHAGOGASTRODUODENOSCOPY [name] 03/06/2013   Procedure: ESOPHAGOGASTRODUODENOSCOPY (EGD);  Surgeon: Louis Meckel, MD;  Location: [facility];  Service: Endoscopy;  Laterality: [name];   FOOT OSTEOTOMY W/ PLANTAR FASCIA RELEASE Left    ILIOTIBIAL BAND RELEASE Right 1997   implantable loop recorder placement  01/23/2020  Medtronic Reveal Linq model LNQ 22 (RLB [id number]) implantable loop recorder   JOINT REPLACEMENT     LAPAROSCOPIC GASTRIC SLEEVE RESECTION WITH HIATAL HERNIA REPAIR  08/20/2014   Procedure: LAPAROSCOPIC GASTRIC SLEEVE RESECTION WITH HIATAL HERNIA  REPAIR AND  UPPER ENDOSCOPY;  Surgeon: Luretha Murphy, MD;  Location: [facility];  Service: General;;   LEFT HEART CATHETERIZATION WITH CORONARY ANGIOGRAM Bilateral 09/10/2011   Procedure: LEFT HEART CATHETERIZATION WITH CORONARY ANGIOGRAM;  Surgeon: Runell Gess, MD;  Location[facility] CATH LAB;  Service: Cardiovascular;  Laterality: Bilateral;   NASAL SINUS SURGERY  1997   REVERSE SHOULDER ARTHROPLASTY Right 05/14/2016   Procedure: RIGHT REVERSE SHOULDER ARTHROPLASTY;  Surgeon: Beverely Low, MD;  [facility];  Service: Orthopedics;   Laterality: Right;   REVERSE SHOULDER ARTHROPLASTY Left 05/16/2020   Procedure: REVERSE SHOULDER ARTHROPLASTY;  Surgeon: Beverely Low, MD;  Location: [facility];  Service: Orthopedics;  Laterality: Left;  interscalene exparel   SHOULDER SURGERY Left    tfc wrist Left 1997   thumb replacement Bilateral    one 2009 and one 2014   TOTAL ANKLE REPLACEMENT Right    x 3   TOTAL ANKLE REPLACEMENT Left    TOTAL ANKLE REPLACEMENT     TOTAL HIP ARTHROPLASTY Bilateral    TOTAL KNEE ARTHROPLASTY Bilateral    left 2002, right 2012   TOTAL SHOULDER ARTHROPLASTY Right 05/14/2016   TRANSTHORACIC ECHOCARDIOGRAM  09/09/2011   MILD CONCENTRIC LVH. TRACE MITRAL REGURG. TRACE TR. MILD AORTIC REGURG.   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   UPPER GI ENDOSCOPY  08/20/2014   Procedure: UPPER GI ENDOSCOPY;  Surgeon: Luretha Murphy, MD;  Location: [facility];  Service: General;;   WRIST SURGERY Left 1997, 2009     Current Outpatient Medications  Medication Sig Dispense Refill   acetaminophen (TYLENOL) 500 MG tablet Take 1,000 mg by mouth every 6 (six) hours as needed (for pain.).      albuterol (PROVENTIL HFA;VENTOLIN HFA) 108 (90 Base) MCG/ACT inhaler Inhale 2 puffs into the lungs every 6 (six) hours as needed for shortness of breath or wheezing.     ALPRAZolam (XANAX) 0.25 MG tablet Take 0.25 mg by mouth daily as needed for anxiety.     BIOTIN PO Take 1 tablet by mouth daily.     calcium carbonate (TUMS - DOSED IN MG ELEMENTAL CALCIUM) 500 MG chewable tablet Chew 1,500 mg by mouth daily as needed for indigestion or heartburn.     Cholecalciferol (DIALYVITE VITAMIN D 5000) 125 MCG (5000 UT) capsule Take 5,000 Units by mouth 2 (two) times daily.     colestipol (COLESTID) 1 g tablet Take once daily and titrate as needed 60 tablet 2   diclofenac (VOLTAREN) 75 MG EC tablet Take 75 mg by mouth as needed.     dicyclomine (BENTYL) 10 MG capsule TAKE 1 CAPSULE BY MOUTH TWICE DAILY AS NEEDED 60 capsule 1   diltiazem  (CARDIZEM CD) 240 MG 24 hr capsule TAKE 1 CAPSULE BY MOUTH EVERY DAY 30 capsule 1   Docusate Sodium (COLACE PO) Take 1 tablet by mouth 2 (two) times daily.     DULoxetine (CYMBALTA) 60 MG capsule Take 60 mg by mouth daily.     esomeprazole (NEXIUM) 40 MG capsule TAKE 1 CAPSULE BY MOUTH TWICE A DAY BEFOR A MEAL 180 capsule 0   fluticasone (FLONASE) 50 MCG/ACT nasal spray Place into both nostrils daily.     loratadine (CLARITIN) 10 MG tablet Take  10 mg by mouth daily.     losartan (COZAAR) 50 MG tablet Take 50 mg by mouth daily.     Melatonin 10 MG TABS Take 10 mg by mouth at bedtime.     Multiple Vitamin (MULTIVITAMIN WITH MINERALS) TABS tablet Take 1 tablet by mouth daily. Centrum     ondansetron (ZOFRAN-ODT) 8 MG disintegrating tablet DISSOLVE 1 TABLET IN MOUTH EVERY 8 HRS AS NEEDED NAUSEA 30 tablet 1   simvastatin (ZOCOR) 20 MG tablet Take 1 tablet (20 mg total) by mouth daily. 30 tablet 11   vitamin B-12 (CYANOCOBALAMIN) 1000 MCG tablet Take 1,000 mcg by mouth daily.     No current facility-administered medications for this visit.    Allergies:   Ativan [lorazepam], Cefdinir, and Buprenorphine hcl   Social History:  The patient  reports that she has never smoked. She has never used smokeless tobacco. She reports that she does not drink alcohol and does not use drugs.   Family History:  The patient's family history includes Alcoholism in her father; Arthritis in her mother; Cancer in her father and mother; Diabetes in her mother; Esophageal cancer in her mother; Esophageal cancer (age of onset: 87) in her father; Heart disease in her maternal grandmother; Hypertension in her mother and sister; Other in her sister; Stroke in her paternal grandmother and sister.  ROS:  Please see the history of present illness.    All other systems are reviewed and otherwise negative.   PHYSICAL EXAM:  VS:  BP 108/72   Pulse 93   Ht 5' 4.5" (1.638 m)   Wt 236 lb 3.2 oz (107.1 kg)   SpO2 93%   BMI 39.92  kg/m  BMI: Body mass index is 39.92 kg/m. Well nourished, well developed, in no acute distress HEENT: normocephalic, atraumatic Neck: no JVD, carotid bruits or masses Cardiac:  RRR; no significant murmurs, no rubs, or gallops Lungs:  CTA b/l, no wheezing, rhonchi or rales Abd: soft, nontender MS: no deformity or atrophy Ext: no edema Skin: warm and dry, no rash Neuro:  No gross deficits appreciated Psych: euthymic mood, full affect  ILR site is stable, no tethering or discomfort   EKG:  Done today and reviewed by myself shows  SR 93bpm, LAD, poor/no R progression, unchanged from prior  Device interrogation done today and reviewed by myself:  Battery is good R waves 0.30mV pause, brady episodes available for review are all false undersensing AFib episodes available for review also false with PACs  10/26/2013: TTE Study Conclusions  - Left ventricle: The cavity size was normal. There was moderate    concentric hypertrophy. Systolic function was normal. The    estimated ejection fraction was in the range of 55% to 60%. Wall    motion was normal; there were no regional wall motion    abnormalities. Doppler parameters are consistent with abnormal    left ventricular relaxation (grade 1 diastolic dysfunction). The    E/e&' ratio is between 8-15, suggesting indeterminate LV filling    pressure.  - Aortic valve: Poorly visualized. Trileaflet. There was trivial    regurgitation.  - Right atrium: The atrium was normal in size.   Recent Labs: No results found for requested labs within last 365 days.  No results found for requested labs within last 365 days.   CrCl cannot be calculated (Patient's most recent lab result is older than the maximum 21 days allowed.).   Wt Readings from Last 3 Encounters:  08/26/22 236 lb  3.2 oz (107.1 kg)  01/26/22 236 lb (107 kg)  08/12/21 237 lb (107.5 kg)     Other studies reviewed: Additional studies/records reviewed today include: summarized  above  ASSESSMENT AND PLAN:  Paroxysmal Afib CHA2DS2Vasc is 3 Not currently on a/c Hx of GIB  No true AFib on available tracings for review today  HTN Looks good   Discussed need for new EP MD, plan to transition to Dr. Nelly Laurence   Disposition: F/u with remotes as usual, in clinic in a year, sooner if needed  Current medicines are reviewed at length with the patient today.  The patient did not have any concerns regarding medicines.  Norma Fredrickson, PA-C 08/26/2022 4:51 PM     [facility] [address] [place] [address] [phone] (office)  [phone] (fax)

## 2022-09-07 ENCOUNTER — Other Ambulatory Visit: Payer: Self-pay | Admitting: Gastroenterology

## 2022-09-10 NOTE — Progress Notes (Signed)
Carelink Summary Report / Loop Recorder 

## 2022-09-15 DIAGNOSIS — M5451 Vertebrogenic low back pain: Secondary | ICD-10-CM | POA: Diagnosis not present

## 2022-09-15 LAB — CUP PACEART REMOTE DEVICE CHECK
Date Time Interrogation Session: 20240625230421
Implantable Pulse Generator Implant Date: 20211103

## 2022-09-20 ENCOUNTER — Ambulatory Visit (INDEPENDENT_AMBULATORY_CARE_PROVIDER_SITE_OTHER): Payer: Medicare Other

## 2022-09-20 DIAGNOSIS — I480 Paroxysmal atrial fibrillation: Secondary | ICD-10-CM

## 2022-09-20 DIAGNOSIS — M7061 Trochanteric bursitis, right hip: Secondary | ICD-10-CM | POA: Diagnosis not present

## 2022-09-20 DIAGNOSIS — M7062 Trochanteric bursitis, left hip: Secondary | ICD-10-CM | POA: Diagnosis not present

## 2022-09-21 DIAGNOSIS — M5451 Vertebrogenic low back pain: Secondary | ICD-10-CM | POA: Diagnosis not present

## 2022-09-27 DIAGNOSIS — M5451 Vertebrogenic low back pain: Secondary | ICD-10-CM | POA: Diagnosis not present

## 2022-09-29 DIAGNOSIS — M5451 Vertebrogenic low back pain: Secondary | ICD-10-CM | POA: Diagnosis not present

## 2022-10-04 DIAGNOSIS — M7918 Myalgia, other site: Secondary | ICD-10-CM | POA: Diagnosis not present

## 2022-10-04 DIAGNOSIS — M5451 Vertebrogenic low back pain: Secondary | ICD-10-CM | POA: Diagnosis not present

## 2022-10-04 DIAGNOSIS — M7062 Trochanteric bursitis, left hip: Secondary | ICD-10-CM | POA: Diagnosis not present

## 2022-10-05 ENCOUNTER — Other Ambulatory Visit: Payer: Self-pay | Admitting: Cardiovascular Disease

## 2022-10-07 ENCOUNTER — Other Ambulatory Visit: Payer: Self-pay | Admitting: Gastroenterology

## 2022-10-11 DIAGNOSIS — M5451 Vertebrogenic low back pain: Secondary | ICD-10-CM | POA: Diagnosis not present

## 2022-10-11 NOTE — Progress Notes (Signed)
Carelink Summary Report / Loop Recorder 

## 2022-10-13 DIAGNOSIS — M5451 Vertebrogenic low back pain: Secondary | ICD-10-CM | POA: Diagnosis not present

## 2022-10-18 DIAGNOSIS — M5451 Vertebrogenic low back pain: Secondary | ICD-10-CM | POA: Diagnosis not present

## 2022-10-20 DIAGNOSIS — M5451 Vertebrogenic low back pain: Secondary | ICD-10-CM | POA: Diagnosis not present

## 2022-10-21 DIAGNOSIS — F418 Other specified anxiety disorders: Secondary | ICD-10-CM | POA: Diagnosis not present

## 2022-10-21 DIAGNOSIS — F3342 Major depressive disorder, recurrent, in full remission: Secondary | ICD-10-CM | POA: Diagnosis not present

## 2022-10-21 DIAGNOSIS — G3184 Mild cognitive impairment, so stated: Secondary | ICD-10-CM | POA: Diagnosis not present

## 2022-10-22 ENCOUNTER — Encounter: Payer: Self-pay | Admitting: Physician Assistant

## 2022-10-25 ENCOUNTER — Ambulatory Visit (INDEPENDENT_AMBULATORY_CARE_PROVIDER_SITE_OTHER): Payer: Medicare Other

## 2022-10-25 ENCOUNTER — Other Ambulatory Visit: Payer: Self-pay | Admitting: Internal Medicine

## 2022-10-25 DIAGNOSIS — G3184 Mild cognitive impairment, so stated: Secondary | ICD-10-CM

## 2022-10-25 DIAGNOSIS — I480 Paroxysmal atrial fibrillation: Secondary | ICD-10-CM | POA: Diagnosis not present

## 2022-11-03 DIAGNOSIS — H903 Sensorineural hearing loss, bilateral: Secondary | ICD-10-CM | POA: Diagnosis not present

## 2022-11-03 DIAGNOSIS — R42 Dizziness and giddiness: Secondary | ICD-10-CM | POA: Diagnosis not present

## 2022-11-05 ENCOUNTER — Telehealth: Payer: Self-pay

## 2022-11-05 DIAGNOSIS — M5451 Vertebrogenic low back pain: Secondary | ICD-10-CM | POA: Diagnosis not present

## 2022-11-05 NOTE — Telephone Encounter (Signed)
Pt's husband came into the office and wants to talk to the doctor about getting her off of some medications. She has not seen Dr. Allyson Sabal in "years". Advised pt she needs an appointment. Husband agreed and verbalized understanding.

## 2022-11-08 ENCOUNTER — Other Ambulatory Visit: Payer: Self-pay | Admitting: Gastroenterology

## 2022-11-08 DIAGNOSIS — M5451 Vertebrogenic low back pain: Secondary | ICD-10-CM | POA: Diagnosis not present

## 2022-11-09 NOTE — Progress Notes (Signed)
Carelink Summary Report / Loop Recorder 

## 2022-11-12 ENCOUNTER — Encounter: Payer: Self-pay | Admitting: Physician Assistant

## 2022-11-12 ENCOUNTER — Ambulatory Visit (INDEPENDENT_AMBULATORY_CARE_PROVIDER_SITE_OTHER): Payer: Medicare Other | Admitting: Physician Assistant

## 2022-11-12 VITALS — Resp 20 | Ht 64.5 in

## 2022-11-12 DIAGNOSIS — R413 Other amnesia: Secondary | ICD-10-CM | POA: Diagnosis not present

## 2022-11-12 MED ORDER — MEMANTINE HCL 5 MG PO TABS: 5.0000 mg | ORAL_TABLET | Freq: Every evening | ORAL | 11 refills | Status: DC

## 2022-11-12 NOTE — Progress Notes (Signed)
Assessment/Plan:   [name] is a very pleasant 77 y.o. year old RH female with a history of hypertension, hyperlipidemia, atrial fibrillation on Eliquis, anxiety, depression on remission, seen today for evaluation of memory loss. MoCA today is 18/30.  Patient is able to participate on his IADLs and continues to drive. Workup is in progress.    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   Neurocognitive testing to further evaluate cognitive concerns and determine other underlying cause of memory changes, including potential contribution from sleep, anxiety, or depression  Start memantine 5 mg nightly, may increase to 5 mg bid if tolerated. Side effects discussed . ( Intermittent diarrhea, IBS) Check the results of B12, TSH done by PCP Continue to control mood as per PCP Recommend good control of cardiovascular risk factors Folllow up in 1-2  months  Subjective:   The patient is accompanied by her husband  who supplements the history.   How long did patient have memory difficulties? For the last year.  Sometimes she forgets the times of her appointments or names  repeats oneself?  Endorsed Disoriented when walking into a room?  Patient denies   Leaving objects in unusual places? denies   Wandering behavior?  denies   Any personality changes?  Patient denies   Any history of depression?:  Patient denies any recent depression, it is on remission  Hallucinations or paranoia?  Patient denies   Seizures?   Patient denies    Any sleep changes?   Sleeps well " most of the time" Occasional vivid dreams, denies REM behavior or sleepwalking   Sleep apnea?  Patient denies   Any hygiene concerns?  Patient denies   Independent of bathing and dressing?  Endorsed  Does the patient needs help with medications? Husband is in charge    Who is in charge of the finances? We have separate accounts but husband is in charge     Any changes in appetite?  Denies      Patient have trouble swallowing? Denies    Does the patient cook?  Yes "sometimes she forgets ingredients, which frustrates her"-husband says.   Any kitchen accidents such as leaving the stove on? denies   Any headaches?   Denies    Chronic pain ?  arthritis     Ambulates with difficulty?  Started balance therapy and likes like it   Recent falls or head injuries? Denies    Vision changes? denies   Unilateral weakness, numbness or tingling? Denies    Any tremors?   Denies    Any anosmia?  Denies    Any incontinence of urine?  Endorsed. Any bowel dysfunction? Yes "it goes back and forth, mostly diarrhea". She has a history of IBS  Patient lives with her husband History of heavy alcohol intake? Denies.   History of heavy tobacco use? Denies.   Family history of dementia? "I don't really know".   Does patient drive?  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    Past Medical History:  Diagnosis Date   Allergy    Anemia    Antral polyp    benign   Anxiety    Arthritis    Asthma    B12 deficiency    Back pain    Chest pain  Constipation    Depression    Diverticulosis    Gallbladder problem    GERD (gastroesophageal reflux disease) 09/09/2011   History of transfusion    HTN (hypertension) 09/09/2011   Hyperlipemia 09/09/2011   Hypertension    IBS (irritable bowel syndrome)    Internal hemorrhoid    Joint pain    Obesity    Osteoarthritis    Paroxysmal atrial fibrillation (HCC)    Pneumonia    PONV (postoperative nausea and vomiting)    with big surgeries, none with endos etc, with colonoscopy- 5-6 years ago could not swallow    Shortness of breath    Sleep apnea    borderline per patient no cpap    SOB (shortness of breath) on exertion    Swallowing difficulty    Tachycardia    Unstable angina (HCC)  09/09/2011   Vitamin D deficiency    Vocal cord dysfunction 2013   swelling     Past Surgical History:  Procedure Laterality Date   ABDOMINAL HYSTERECTOMY  1988   ANKLE FUSION Right    x 2   BACK SURGERY  [phone]   3 ruptured discs, lower back   BILATERAL ANTERIOR TOTAL HIP ARTHROPLASTY Bilateral 11/12/2014   Procedure: BILATERAL ANTERIOR TOTAL HIP ARTHROPLASTY;  Surgeon: Durene Romans, MD;  Location: [facility];  Service: Orthopedics;  Laterality: Bilateral;   BRAVO PH STUDY [name] 03/06/2013   Procedure: BRAVO PH STUDY;  Surgeon: Louis Meckel, MD;  Location: [facility];  Service: Endoscopy;  Laterality: [name];   CARDIAC CATHETERIZATION  2013   NORMAL   CHOLECYSTECTOMY     COLONOSCOPY     ELBOW SURGERY Left    removed bone chip   ESOPHAGOGASTRODUODENOSCOPY [name] 03/06/2013   Procedure: ESOPHAGOGASTRODUODENOSCOPY (EGD);  Surgeon: Louis Meckel, MD;  Location: [facility];  Service: Endoscopy;  Laterality: [name];   FOOT OSTEOTOMY W/ PLANTAR FASCIA RELEASE Left    ILIOTIBIAL BAND RELEASE Right 1997   implantable loop recorder placement  01/23/2020   Medtronic Reveal Linq model LNQ 22 (RLB [id number]) implantable loop recorder   JOINT REPLACEMENT     LAPAROSCOPIC GASTRIC SLEEVE RESECTION WITH HIATAL HERNIA REPAIR  08/20/2014   Procedure: LAPAROSCOPIC GASTRIC SLEEVE RESECTION WITH HIATAL HERNIA  REPAIR AND  UPPER ENDOSCOPY;  Surgeon: Luretha Murphy, MD;  Location: [facility];  Service: General;;   LEFT HEART CATHETERIZATION WITH CORONARY ANGIOGRAM Bilateral 09/10/2011   Procedure: LEFT HEART CATHETERIZATION WITH CORONARY ANGIOGRAM;  Surgeon: Runell Gess, MD;  Location: [facility] CATH LAB;  Service: Cardiovascular;  Laterality: Bilateral;   NASAL SINUS SURGERY  1997   REVERSE SHOULDER ARTHROPLASTY Right 05/14/2016   Procedure: RIGHT REVERSE SHOULDER ARTHROPLASTY;  Surgeon: Beverely Low, MD;  Location: [facility] OR;  Service: Orthopedics;  Laterality: Right;   REVERSE SHOULDER ARTHROPLASTY Left 05/16/2020    Procedure: REVERSE SHOULDER ARTHROPLASTY;  Surgeon: Beverely Low, MD;  Location: [facility];  Service: Orthopedics;  Laterality: Left;  interscalene exparel   SHOULDER SURGERY Left    tfc wrist Left 1997   thumb replacement Bilateral    one 2009 and one 2014   TOTAL ANKLE REPLACEMENT Right    x 3   TOTAL ANKLE REPLACEMENT Left    TOTAL ANKLE REPLACEMENT     TOTAL HIP ARTHROPLASTY Bilateral    TOTAL KNEE ARTHROPLASTY Bilateral    left 2002, right 2012   TOTAL SHOULDER ARTHROPLASTY Right 05/14/2016   TRANSTHORACIC ECHOCARDIOGRAM  09/09/2011   MILD CONCENTRIC LVH. TRACE MITRAL REGURG. TRACE TR.  MILD AORTIC REGURG.   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   UPPER GI ENDOSCOPY  08/20/2014   Procedure: UPPER GI ENDOSCOPY;  Surgeon: Luretha Murphy, MD;  Location: [facility];  Service: General;;   WRIST SURGERY Left 1997, 2009      Allergies  Allergen Reactions   Ativan [Lorazepam] Other (See Comments)    Agitation, aggressive actions, significant mental changes   Cefdinir Hives    headache   Buprenorphine Hcl Itching and Rash    Current Outpatient Medications  Medication Instructions   acetaminophen (TYLENOL) 1,000 mg, Oral, Every 6 hours PRN   albuterol (PROVENTIL HFA;VENTOLIN HFA) 108 (90 Base) MCG/ACT inhaler 2 puffs, Every 6 hours PRN   ALPRAZolam (XANAX) 0.25 mg, Oral, Daily PRN   BIOTIN PO 1 tablet, Oral, Daily   calcium carbonate (TUMS - DOSED IN MG ELEMENTAL CALCIUM) 1,500 mg, Oral, Daily PRN   colestipol (COLESTID) 1 g tablet Take once daily and titrate as needed   cyanocobalamin (VITAMIN B12) 3,000 mcg, Oral, Daily   Dialyvite Vitamin D 5000 5,000 Units, Oral, 2 times daily   diclofenac (VOLTAREN) 75 mg, As needed   dicyclomine (BENTYL) 10 MG capsule TAKE 1 CAPSULE BY MOUTH TWICE DAILY AS NEEDED   diltiazem (CARDIZEM CD) 240 MG 24 hr capsule Oral, Daily   Docusate Sodium (COLACE PO) 1 tablet, Oral, 2 times daily   DULoxetine (CYMBALTA) 60 mg, Oral, Daily, Tapering down  to 30mg     esomeprazole (NEXIUM) 40 MG capsule TAKE 1 CAPSULE BY MOUTH TWICE A DAY BEFOR A MEAL   fluticasone (FLONASE) 50 MCG/ACT nasal spray Each Nare, Daily   loratadine (CLARITIN) 10 mg, Oral, Daily   losartan (COZAAR) 50 mg, Oral, Daily   Melatonin 10 mg, Oral, Daily at bedtime   memantine (NAMENDA) 5 mg, Oral, Nightly   Multiple Vitamin (MULTIVITAMIN WITH MINERALS) TABS tablet 1 tablet, Oral, Daily, Centrum    ondansetron (ZOFRAN-ODT) 8 MG disintegrating tablet DISSOLVE 1 TABLET IN MOUTH EVERY 8 HRS AS NEEDED NAUSEA   simvastatin (ZOCOR) 20 mg, Oral, Daily     VITALS:   Vitals:   11/12/22 1257  Resp: 20  Height: 5' 4.5" (1.638 m)     PHYSICAL EXAM   HEENT:  Normocephalic, atraumatic. The superficial temporal arteries are without ropiness or tenderness. Cardiovascular: Regular rate and rhythm. Lungs: Clear to auscultation bilaterally. Neck: There are no carotid bruits noted bilaterally.  NEUROLOGICAL:    11/12/2022    1:00 PM  Montreal Cognitive Assessment   Visuospatial/ Executive (0/5) 1  Naming (0/3) 3  Attention: Read list of digits (0/2) 2  Attention: Read list of letters (0/1) 1  Attention: Serial 7 subtraction starting at 100 (0/3) 1  Language: Repeat phrase (0/2) 2  Language : Fluency (0/1) 1  Abstraction (0/2) 0  Delayed Recall (0/5) 0  Orientation (0/6) 6  Total 17  Adjusted Score (based on education) 18        No data to display           Orientation:  Alert and oriented to person, place and time. No aphasia or dysarthria. Fund of knowledge is appropriate. Recent memory impaired and remote memory intact.  Attention and concentration are reduced.  Able to name objects and repeat phrases. Delayed recall 0/5 Cranial nerves: There is good facial symmetry. Extraocular muscles are intact and visual fields are full to confrontational testing. Speech is fluent and clear. No tongue deviation. Hearing is intact to conversational  tone. Tone: Tone is good  throughout. Sensation: Sensation is intact to light touch. Vibration is intact at the bilateral big toe.  Coordination: The patient has no difficulty with RAM's or FNF bilaterally. Normal finger to nose  Motor: Strength is 5/5 in the bilateral upper and lower extremities. There is no pronator drift. There are no fasciculations noted. DTR's: Deep tendon reflexes are 2/4 bilaterally. Gait and Station: The patient is able to ambulate without difficulty.The patient is able to heel toe walk without any difficulty. Gait is cautious and narrow. The patient is able to ambulate in a tandem fashion.       Thank you for allowing [place] the opportunity to participate in the care of this nice patient. Please do not hesitate to contact us for any questions or concerns.   Total time spent on today's visit was 65 minutes dedicated to this patient today, preparing to see patient, examining the patient, ordering tests and/or medications and counseling the patient, documenting clinical information in the EHR or other health record, independently interpreting results and communicating results to the patient/family, discussing treatment and goals, answering patient's questions and coordinating care.  Cc:  Cleatis Polka., MD  Marlowe Kays 11/14/2022 6:05 PM

## 2022-11-12 NOTE — Patient Instructions (Addendum)
It was a pleasure to see you today at our office.   Recommendations:  Neurocognitive evaluation at our office   MRI of the brain results to be discussed on neext appt Oct 10 at 1 pm   Start Memantine 5 mg at night   For psychiatric meds, mood meds: Please have your primary care physician manage these medications.  If you have any severe symptoms of a stroke, or other severe issues such as confusion,severe chills or fever, etc call 911 or go to the ER as you may need to be evaluated further    For assessment of decision of mental capacity and competency:  Call Dr. Erick Blinks, geriatric psychiatrist at [phone]  Counseling regarding caregiver distress, including caregiver depression, anxiety and issues regarding community resources, adult day care programs, adult living facilities, or memory care questions:  please contact your  Primary Doctor's Social Worker   Whom to call: Memory  decline, memory medications: Call our office [phone]    [email or web address]   RECOMMENDATIONS FOR ALL PATIENTS WITH MEMORY PROBLEMS: 1. Continue to exercise (Recommend 30 minutes of walking everyday, or 3 hours every week) 2. Increase social interactions - continue going to [facility] and enjoy social gatherings with friends and family 3. Eat healthy, avoid fried foods and eat more fruits and vegetables 4. Maintain adequate blood pressure, blood sugar, and blood cholesterol level. Reducing the risk of stroke and cardiovascular disease also helps promoting better memory. 5. Avoid stressful situations. Live a simple life and avoid aggravations. Organize your time and prepare for the next day in anticipation. 6. Sleep well, avoid any interruptions of sleep and avoid any distractions in the bedroom that may interfere with adequate sleep quality 7. Avoid sugar, avoid sweets as there is a strong link between excessive sugar intake, diabetes, and  cognitive impairment We discussed the Mediterranean diet, which has been shown to help patients reduce the risk of progressive memory disorders and reduces cardiovascular risk. This includes eating fish, eat fruits and green leafy vegetables, nuts like almonds and hazelnuts, walnuts, and also use olive oil. Avoid fast foods and fried foods as much as possible. Avoid sweets and sugar as sugar use has been linked to worsening of memory function.  There is always a concern of gradual progression of memory problems. If this is the case, then we may need to adjust level of care according to patient needs. Support, both to the patient and caregiver, should then be put into place.      You have been referred for a neuropsychological evaluation (i.e., evaluation of memory and thinking abilities). Please bring someone with you to this appointment if possible, as it is helpful for the doctor to hear from both you and another adult who knows you well. Please bring eyeglasses and hearing aids if you wear them.    The evaluation will take approximately 3 hours and has two parts:   The first part is a clinical interview with the neuropsychologist (Dr. Milbert Coulter or Dr. Roseanne Reno). During the interview, the neuropsychologist will speak with you and the individual you brought to the appointment.    The second part of the evaluation is testing with the doctor's technician Annabelle Harman or Selena Batten). During the testing, the technician will ask you to remember different types of material, solve problems, and answer some questionnaires. Your family member will not be present for this portion of the evaluation.   Please note: We must reserve several hours of the neuropsychologist's time and the psychometrician's time  for your evaluation appointment. As such, there is a No-Show fee of $100. If you are unable to attend any of your appointments, please contact our office as soon as possible to reschedule.      DRIVING: Regarding driving,  in patients with progressive memory problems, driving will be impaired. We advise to have someone else do the driving if trouble finding directions or if minor accidents are reported. Independent driving assessment is available to determine safety of driving.   If you are interested in the driving assessment, you can contact the following:  [facility] [employer] in [place] [phone]  [facility] [phone]  [facility] [phone]  [facility] [phone] or [phone]   FALL PRECAUTIONS: Be cautious when walking. Scan the area for obstacles that may increase the risk of trips and falls. When getting up in the mornings, sit up at the edge of the bed for a few minutes before getting out of bed. Consider elevating the bed at the head end to avoid drop of blood pressure when getting up. Walk always in a well-lit room (use night lights in the walls). Avoid area rugs or power cords from appliances in the middle of the walkways. Use a walker or a cane if necessary and consider physical therapy for balance exercise. Get your eyesight checked regularly.  FINANCIAL OVERSIGHT: Supervision, especially oversight when making financial decisions or transactions is also recommended.  HOME SAFETY: Consider the safety of the kitchen when operating appliances like stoves, microwave oven, and blender. Consider having supervision and share cooking responsibilities until no longer able to participate in those. Accidents with firearms and other hazards in the house should be identified and addressed as well.   ABILITY TO BE LEFT ALONE: If patient is unable to contact 911 operator, consider using LifeLine, or when the need is there, arrange for someone to stay with patients. Smoking is a fire hazard, consider supervision or cessation. Risk of wandering should be assessed by caregiver and if detected at any point, supervision and safe proof recommendations should be  instituted.  MEDICATION SUPERVISION: Inability to self-administer medication needs to be constantly addressed. Implement a mechanism to ensure safe administration of the medications.      Mediterranean Diet A Mediterranean diet refers to food and lifestyle choices that are based on the traditions of countries located on the [employer]. This way of eating has been shown to help prevent certain conditions and improve outcomes for people who have chronic diseases, like kidney disease and heart disease. What are tips for following this plan? Lifestyle  Cook and eat meals together with your family, when possible. Drink enough fluid to keep your urine clear or pale yellow. Be physically active every day. This includes: Aerobic exercise like running or swimming. Leisure activities like gardening, walking, or housework. Get 7-8 hours of sleep each night. If recommended by your health care provider, drink red wine in moderation. This means 1 glass a day for nonpregnant women and 2 glasses a day for men. A glass of wine equals 5 oz (150 mL). Reading food labels  Check the serving size of packaged foods. For foods such as rice and pasta, the serving size refers to the amount of cooked product, not dry. Check the total fat in packaged foods. Avoid foods that have saturated fat or trans fats. Check the ingredients list for added sugars, such as corn syrup. Shopping  At the grocery store, buy most of your food from the areas near the  walls of the store. This includes: Fresh fruits and vegetables (produce). Grains, beans, nuts, and seeds. Some of these may be available in unpackaged forms or large amounts (in bulk). Fresh seafood. Poultry and eggs. Low-fat dairy products. Buy whole ingredients instead of prepackaged foods. Buy fresh fruits and vegetables in-season from local farmers markets. Buy frozen fruits and vegetables in resealable bags. If you do not have access to quality fresh  seafood, buy precooked frozen shrimp or canned fish, such as tuna, salmon, or sardines. Buy small amounts of raw or cooked vegetables, salads, or olives from the deli or salad bar at your store. Stock your pantry so you always have certain foods on hand, such as olive oil, canned tuna, canned tomatoes, rice, pasta, and beans. Cooking  Cook foods with extra-virgin olive oil instead of using butter or other vegetable oils. Have meat as a side dish, and have vegetables or grains as your main dish. This means having meat in small portions or adding small amounts of meat to foods like pasta or stew. Use beans or vegetables instead of meat in common dishes like chili or lasagna. Experiment with different cooking methods. Try roasting or broiling vegetables instead of steaming or sauteing them. Add frozen vegetables to soups, stews, pasta, or rice. Add nuts or seeds for added healthy fat at each meal. You can add these to yogurt, salads, or vegetable dishes. Marinate fish or vegetables using olive oil, lemon juice, garlic, and fresh herbs. Meal planning  Plan to eat 1 vegetarian meal one day each week. Try to work up to 2 vegetarian meals, if possible. Eat seafood 2 or more times a week. Have healthy snacks readily available, such as: Vegetable sticks with hummus. Greek yogurt. Fruit and nut trail mix. Eat balanced meals throughout the week. This includes: Fruit: 2-3 servings a day Vegetables: 4-5 servings a day Low-fat dairy: 2 servings a day Fish, poultry, or lean meat: 1 serving a day Beans and legumes: 2 or more servings a week Nuts and seeds: 1-2 servings a day Whole grains: 6-8 servings a day Extra-virgin olive oil: 3-4 servings a day Limit red meat and sweets to only a few servings a month What are my food choices? Mediterranean diet Recommended Grains: Whole-grain pasta. Brown rice. Bulgar wheat. Polenta. Couscous. Whole-wheat bread. Orpah Cobb. Vegetables: Artichokes. Beets.  Broccoli. Cabbage. Carrots. Eggplant. Green beans. Chard. Kale. Spinach. Onions. Leeks. Peas. Squash. Tomatoes. Peppers. Radishes. Fruits: Apples. Apricots. Avocado. Berries. Bananas. Cherries. Dates. Figs. Grapes. Lemons. Melon. Oranges. Peaches. Plums. Pomegranate. Meats and other protein foods: Beans. Almonds. Sunflower seeds. Pine nuts. Peanuts. Cod. Salmon. Scallops. Shrimp. Tuna. Tilapia. Clams. Oysters. Eggs. Dairy: Low-fat milk. Cheese. Greek yogurt. Beverages: Water. Red wine. Herbal tea. Fats and oils: Extra virgin olive oil. Avocado oil. Grape seed oil. Sweets and desserts: [place] yogurt with honey. Baked apples. Poached pears. Trail mix. Seasoning and other foods: Basil. Cilantro. Coriander. Cumin. Mint. Parsley. Sage. Rosemary. Tarragon. Garlic. Oregano. Thyme. Pepper. Balsalmic vinegar. Tahini. Hummus. Tomato sauce. Olives. Mushrooms. Limit these Grains: Prepackaged pasta or rice dishes. Prepackaged cereal with added sugar. Vegetables: Deep fried potatoes (french fries). Fruits: Fruit canned in syrup. Meats and other protein foods: Beef. Pork. Lamb. Poultry with skin. Hot dogs. Tomasa Blase. Dairy: Ice cream. Sour cream. Whole milk. Beverages: Juice. Sugar-sweetened soft drinks. Beer. Liquor and spirits. Fats and oils: Butter. Canola oil. Vegetable oil. Beef fat (tallow). Lard. Sweets and desserts: Cookies. Cakes. Pies. Candy. Seasoning and other foods: Mayonnaise. Premade sauces and marinades. The items  listed may not be a complete list. Talk with your dietitian about what dietary choices are right for you. Summary The Mediterranean diet includes both food and lifestyle choices. Eat a variety of fresh fruits and vegetables, beans, nuts, seeds, and whole grains. Limit the amount of red meat and sweets that you eat. Talk with your health care provider about whether it is safe for you to drink red wine in moderation. This means 1 glass a day for nonpregnant women and 2 glasses a day for  men. A glass of wine equals 5 oz (150 mL). This information is not intended to replace advice given to you by your health care provider. Make sure you discuss any questions you have with your health care provider. Document Released: 10/30/2015 Document Revised: 12/02/2015 Document Reviewed: 10/30/2015 Elsevier Interactive Patient Education  2017 [employer].

## 2022-11-13 ENCOUNTER — Other Ambulatory Visit: Payer: Self-pay | Admitting: Gastroenterology

## 2022-11-15 DIAGNOSIS — M5451 Vertebrogenic low back pain: Secondary | ICD-10-CM | POA: Diagnosis not present

## 2022-11-17 ENCOUNTER — Ambulatory Visit: Payer: Medicare Other | Admitting: Cardiology

## 2022-11-17 NOTE — Progress Notes (Signed)
Cardiology Clinic Note   Patient Name: [name] Date of Encounter: 11/19/2022  Primary Care Provider:  Cleatis Polka., MD Primary Cardiologist:  Theresa Batty, MD  Patient Profile    77 year old female with history of HTN, HLD, Afib CHADS VASC Score of 3, obesity (hx of bariatric surgery/gastric sleeve) ILR in situ.  She is not on anticoagulation due to hx of GIB. She was not found to have evidence of atrial fib on last office visit with [name] on 08/26/2022 in Afib clinic on interrogation of ILR.  Has not been seen by [name] since November 2022.  Past Medical History    Past Medical History:  Diagnosis Date   Allergy    Anemia    Antral polyp    benign   Anxiety    Arthritis    Asthma    B12 deficiency    Back pain    Chest pain    Constipation    Depression    Diverticulosis    Gallbladder problem    GERD (gastroesophageal reflux disease) 09/09/2011   History of transfusion    HTN (hypertension) 09/09/2011   Hyperlipemia 09/09/2011   Hypertension    IBS (irritable bowel syndrome)    Internal hemorrhoid    Joint pain    Obesity    Osteoarthritis    Paroxysmal atrial fibrillation (HCC)    Pneumonia    PONV (postoperative nausea and vomiting)    with big surgeries, none with endos etc, with colonoscopy- 5-6 years ago could not swallow    Shortness of breath    Sleep apnea    borderline per patient no cpap    SOB (shortness of breath) on exertion    Swallowing difficulty    Tachycardia    Unstable angina (HCC) 09/09/2011   Vitamin D deficiency    Vocal cord dysfunction 2013   swelling   Past Surgical History:  Procedure Laterality Date   ABDOMINAL HYSTERECTOMY  1988   ANKLE FUSION Right    x 2   BACK SURGERY  [phone]   3 ruptured discs, lower back   BILATERAL ANTERIOR TOTAL HIP ARTHROPLASTY Bilateral 11/12/2014   Procedure: BILATERAL ANTERIOR TOTAL HIP ARTHROPLASTY;  Surgeon: Durene Romans, MD;  Location: [facility];  Service:  Orthopedics;  Laterality: Bilateral;   BRAVO PH STUDY [name] 03/06/2013   Procedure: BRAVO PH STUDY;  Surgeon: Louis Meckel, MD;  Location: [facility];  Service: Endoscopy;  Laterality: [name];   CARDIAC CATHETERIZATION  2013   NORMAL   CHOLECYSTECTOMY     COLONOSCOPY     ELBOW SURGERY Left    removed bone chip   ESOPHAGOGASTRODUODENOSCOPY [name] 03/06/2013   Procedure: ESOPHAGOGASTRODUODENOSCOPY (EGD);  Surgeon: Louis Meckel, MD;  Location: [facility];  Service: Endoscopy;  Laterality: [name];   FOOT OSTEOTOMY W/ PLANTAR FASCIA RELEASE Left    ILIOTIBIAL BAND RELEASE Right 1997   implantable loop recorder placement  01/23/2020   Medtronic Reveal Linq model LNQ 22 (RLB [id number]) implantable loop recorder   JOINT REPLACEMENT     LAPAROSCOPIC GASTRIC SLEEVE RESECTION WITH HIATAL HERNIA REPAIR  08/20/2014   Procedure: LAPAROSCOPIC GASTRIC SLEEVE RESECTION WITH HIATAL HERNIA  REPAIR AND  UPPER ENDOSCOPY;  Surgeon: Luretha Murphy, MD;  Location: [facility];  Service: General;;   LEFT HEART CATHETERIZATION WITH CORONARY ANGIOGRAM Bilateral 09/10/2011   Procedure: LEFT HEART CATHETERIZATION WITH CORONARY ANGIOGRAM;  Surgeon: Runell Gess, MD;  Location: [facility];  Service: Cardiovascular;  Laterality: Bilateral;   NASAL SINUS SURGERY  1997   REVERSE SHOULDER ARTHROPLASTY Right 05/14/2016   Procedure: RIGHT REVERSE SHOULDER ARTHROPLASTY;  Surgeon: Beverely Low, MD;  Location: [facility] OR;  Service: Orthopedics;  Laterality: Right;   REVERSE SHOULDER ARTHROPLASTY Left 05/16/2020   Procedure: REVERSE SHOULDER ARTHROPLASTY;  Surgeon: Beverely Low, MD;  Location: [facility];  Service: Orthopedics;  Laterality: Left;  interscalene exparel   SHOULDER SURGERY Left    tfc wrist Left 1997   thumb replacement Bilateral    one 2009 and one 2014   TOTAL ANKLE REPLACEMENT Right    x 3   TOTAL ANKLE REPLACEMENT Left    TOTAL ANKLE REPLACEMENT     TOTAL HIP ARTHROPLASTY Bilateral    TOTAL KNEE ARTHROPLASTY Bilateral     left 2002, right 2012   TOTAL SHOULDER ARTHROPLASTY Right 05/14/2016   TRANSTHORACIC ECHOCARDIOGRAM  09/09/2011   MILD CONCENTRIC LVH. TRACE MITRAL REGURG. TRACE TR. MILD AORTIC REGURG.   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  She has been having issues with memory loss, has been seen by her primary care provider Dr. Clelia Green who has discontinued some of her medications already and she is noted improvement in her symptoms.  Her husband has looked up all the side effects of the medication she is taking and is concerned that diltiazem is also contributing to her memory loss.  She is being seen by neurology and does have a MRI planned, as well as medications which have been added to help with memory, Namenda 5 mg at bedtime.  The patient also has complaints of worsening dyspnea on exertion and has been using inhaler more often.  She denies chest pain, she does have some mild dizziness, denies PND or orthopnea.   Home Medications    Current Outpatient Medications  Medication Sig Dispense Refill   acetaminophen (TYLENOL) 500 MG tablet Take 1,000 mg by mouth every 6 (six) hours as needed (for pain.).      albuterol (PROVENTIL HFA;VENTOLIN HFA) 108 (90 Base) MCG/ACT inhaler Inhale 2 puffs into the lungs every 6 (six) hours as needed for shortness of breath or wheezing.     ALPRAZolam (XANAX) 0.25 MG tablet Take 0.25 mg by mouth daily as needed for anxiety.     BIOTIN PO Take 1 tablet by  mouth daily.     calcium carbonate (TUMS - DOSED IN MG ELEMENTAL CALCIUM) 500 MG chewable tablet Chew 1,500 mg by mouth daily as needed for indigestion or heartburn.     Cholecalciferol (DIALYVITE VITAMIN D 5000) 125 MCG (5000 UT) capsule Take 5,000 Units by mouth 2 (two) times daily.     dicyclomine (BENTYL) 10 MG capsule TAKE 1 CAPSULE BY MOUTH TWICE DAILY AS NEEDED 60 capsule 1   diltiazem (CARDIZEM CD) 240 MG 24 hr capsule TAKE 1 CAPSULE BY MOUTH EVERY DAY 90 capsule 3   fluticasone (FLONASE) 50 MCG/ACT nasal  spray Place into both nostrils daily.     loratadine (CLARITIN) 10 MG tablet Take 10 mg by mouth daily.     losartan (COZAAR) 50 MG tablet Take 50 mg by mouth daily.     Melatonin 10 MG TABS Take 10 mg by mouth at bedtime.     Multiple Vitamin (MULTIVITAMIN WITH MINERALS) TABS tablet Take 1 tablet by mouth daily. Centrum     ondansetron (ZOFRAN-ODT) 8 MG disintegrating tablet DISSOLVE 1 TABLET IN MOUTH EVERY 8 HRS AS NEEDED NAUSEA 30 tablet 1   simvastatin (ZOCOR) 20 MG tablet Take 1 tablet (20 mg total) by mouth daily. 30 tablet 11   vitamin B-12 (CYANOCOBALAMIN) 1000 MCG tablet Take 3,000 mcg by mouth daily.     colestipol (COLESTID) 1 g tablet Take once daily and titrate as needed (Patient not taking: Reported on 11/19/2022) 60 tablet 2   diclofenac (VOLTAREN) 75 MG EC tablet Take 75 mg by mouth as needed. (Patient not taking: Reported on 11/19/2022)     Docusate Sodium (COLACE PO) Take 1 tablet by mouth 2 (two) times daily. (Patient not taking: Reported on 11/19/2022)     DULoxetine (CYMBALTA) 60 MG capsule Take 60 mg by mouth daily. Tapering down to 30mg  (Patient not taking: Reported on 11/19/2022)     esomeprazole (NEXIUM) 40 MG capsule TAKE 1 CAPSULE BY MOUTH TWICE A DAY BEFOR A MEAL (Patient not taking: Reported on 11/19/2022) 180 capsule 0   memantine (NAMENDA) 5 MG tablet Take 1 tablet (5 mg total) by mouth at bedtime. (Patient not taking: Reported on 11/19/2022) 30 tablet 11   No  current facility-administered medications for this visit.     Family History    Family History  Problem Relation Age of Onset   Hypertension Mother    Diabetes Mother    Arthritis Mother    Esophageal cancer Mother    Cancer Mother    Esophageal cancer Father 41   Cancer Father    Alcoholism Father    Other Sister        kidney mass removed   Hypertension Sister    Stroke Sister        died in 12-10-2018  Heart disease Maternal Grandmother    Stroke Paternal Grandmother    Coronary artery disease Neg Hx    Colon cancer Neg Hx    Liver disease Neg Hx    Kidney disease Neg Hx    Rectal cancer Neg Hx    Stomach cancer Neg Hx    She indicated that her mother is deceased. She indicated that her father is deceased. She indicated that her sister is deceased. She indicated that her maternal grandmother is deceased. She indicated that her paternal grandmother is deceased. She indicated that the status of her neg hx is unknown. She indicated that her other is deceased.  Social History    Social History   Socioeconomic History   Marital status: Married    Spouse name: John   Number of children: 2   Years of education: 12   Highest education level: Not on file  Occupational History   Occupation: retired    Associate Professor: RETIRED  Tobacco Use   Smoking status: Never   Smokeless tobacco: Never  Vaping Use   Vaping status: Never Used  Substance and Sexual Activity   Alcohol use: No   Drug use: No   Sexual activity: Yes    Partners: Male  Other Topics Concern   Not  on file  Social History Narrative   Left handed   One floor home   Drinks caffeine free diet coke   Retired   Lives with husband and queen dog   Social Determinants of Corporate investment banker Strain: Not on file  Food Insecurity: Not on file  Transportation Needs: Not on file  Physical Activity: Not on file  Stress: Not on file  Social Connections: Unknown (08/01/2021)   Received from [facility]   Social Network    Social Network: Not on file  Intimate Partner Violence: Unknown (06/23/2021)   Received from [facility]   HITS    Physically Hurt: Not on file    Insult or Talk Down To: Not on file    Threaten Physical Harm: Not on file    Scream or Curse: Not on file     Review of Systems    General:  No chills, fever, night sweats or weight changes.  Cardiovascular:  No chest pain, worsening dyspnea on exertion, edema, orthopnea, palpitations, paroxysmal nocturnal dyspnea. Dermatological: No rash, lesions/masses Respiratory: No cough, dyspnea Urologic: No hematuria, dysuria Abdominal:   No nausea, vomiting, diarrhea, bright red blood per rectum, melena, or hematemesis Neurologic:  No visual changes, wkns, positive for changes in mental status, positive for memory loss. All other systems reviewed and are otherwise negative except as noted above.       Physical Exam    VS:  BP 112/72 (BP Location: Left Arm, Patient Position: Sitting, Cuff Size: Normal)   Pulse 92   Ht 5\' 2"  (1.575 m)   Wt 232 lb 12.8 oz (105.6 kg)   SpO2 93%   BMI 42.58 kg/m  , BMI Body mass index is 42.58 kg/m.     GEN: Well nourished, well developed, in no acute distress.  Obese HEENT: normal. Neck: Supple, no JVD, carotid bruits, or masses. Cardiac: RRR, occasional extrasystole, no murmurs, rubs, or gallops. No clubbing, cyanosis, edema.  Radials/DP/PT 2+ and equal bilaterally.  Respiratory:  Respirations regular and unlabored, clear to auscultation bilaterally. GI: Soft, nontender, nondistended, BS + x 4. MS: no deformity or atrophy. Skin: warm and dry, no rash. Neuro:  Strength and sensation are intact. Psych: Normal affect.  Trouble remembering dates.      Lab Results  Component Value Date   WBC 6.2 05/08/2020   HGB 10.2 (L) 05/17/2020   HCT 32.9 (L) 05/17/2020   MCV 92.2 05/08/2020   PLT 225 05/08/2020   Lab Results  Component Value Date    CREATININE 0.85 05/17/2020   BUN 21 05/17/2020   NA 139 05/17/2020   K 4.6 05/17/2020   CL 107 05/17/2020   CO2 23 05/17/2020   Lab Results  Component Value Date   ALT 23 10/14/2019   AST 23 10/14/2019   ALKPHOS 61 10/14/2019   BILITOT 0.6 10/14/2019   Lab Results  Component Value Date   CHOL 190 11/17/2017   HDL 95 11/17/2017   LDLCALC 83 11/17/2017   TRIG 60 11/17/2017    Lab Results  Component Value Date   HGBA1C 5.6 11/17/2017   Review of Prior Studies    None since 2015.  Assessment & Plan   1.  Prior history of atrial fibrillation: Being followed in A-fib clinic ILR in place without evidence of atrial fibs.  The patient's husband is adamant that she needs to stop taking diltiazem.  Today her heart rate is elevated in the 90s.  She has been using her inhaler more often.  I am not going to decrease diltiazem at this time as I want to keep her heart rate well-controlled.  I will consider repeating echocardiogram for changes in LV function.  Most recent echocardiogram was completed in 2015.  2.  Chronic dyspnea on exertion: She has been using her inhaler a lot.  With obesity, daytime somnolence, snoring at night, I believe this will warrant a home sleep study to evaluate further need for treatment of OSA.  This may also assist with improved memory if she is getting oxygen as needed during the nighttime hours.  3.  Hypertension: Blood pressure is low normal today.  If any medications would be changed I would decrease the losartan to 25 mg daily from 50 mg daily.  However the patient will need to take her blood pressure at home daily at the same time every day and record this.  Will see her back on follow-up to evaluate what her blood pressure is doing at home along with her heart rate based upon her blood pressure machine.  I am aware that she has an ILR, which has not been found to show any evidence of atrial fibrillation.  On review of her heart rates they are ranging in the high  80s to low 90 bpm.  4.  History of reactive airway disease and asthma.  She will follow-up with PCP.  Considers changing steroid inhaler to Xopenex which would have less effect on heart rate.             Signed, Bettey Mare. Liborio Nixon, ANP, AACC   11/19/2022 4:33 PM      Office [phone] Fax [phone]  Notice: This dictation was prepared with Dragon dictation along with smaller phrase technology. Any transcriptional errors that result from this process are unintentional and may not be corrected upon review.

## 2022-11-19 ENCOUNTER — Ambulatory Visit: Payer: Medicare Other | Attending: Cardiology | Admitting: Adult Health

## 2022-11-19 ENCOUNTER — Encounter: Payer: Self-pay | Admitting: Adult Health

## 2022-11-19 VITALS — BP 112/72 | HR 92 | Ht 62.0 in | Wt 232.8 lb

## 2022-11-19 DIAGNOSIS — R0602 Shortness of breath: Secondary | ICD-10-CM | POA: Insufficient documentation

## 2022-11-19 NOTE — Patient Instructions (Signed)
Medication Instructions:  No Changes *If you need a refill on your cardiac medications before your next appointment, please call your pharmacy*   Lab Work: No Labs If you have labs (blood work) drawn today and your tests are completely normal, you will receive your results only by: MyChart Message (if you have MyChart) OR A paper copy in the mail If you have any lab test that is abnormal or we need to change your treatment, we will call you to review the results.   Testing/Procedures: No Testing   Follow-Up: At [facility], you and your health needs are our priority.  As part of our continuing mission to provide you with exceptional heart care, we have created designated Provider Care Teams.  These Care Teams include your primary Cardiologist (physician) and Advanced Practice Providers (APPs -  Physician Assistants and Nurse Practitioners) who all work together to provide you with the care you need, when you need it.  We recommend signing up for the patient portal called "MyChart".  Sign up information is provided on this After Visit Summary.  MyChart is used to connect with patients for Virtual Visits (Telemedicine).  Patients are able to view lab/test results, encounter notes, upcoming appointments, etc.  Non-urgent messages can be sent to your provider as well.   To learn more about what you can do with MyChart, go to [email or web address].    Your next appointment:   4 week(s)  Provider:   Joni Reining, DNP, ANP

## 2022-11-28 ENCOUNTER — Ambulatory Visit
Admission: RE | Admit: 2022-11-28 | Discharge: 2022-11-28 | Disposition: A | Discharge status: Home / Self Care | Payer: Medicare Other | Source: Ambulatory Visit | Attending: Internal Medicine | Admitting: Internal Medicine

## 2022-11-28 DIAGNOSIS — R413 Other amnesia: Secondary | ICD-10-CM | POA: Diagnosis not present

## 2022-11-28 DIAGNOSIS — G319 Degenerative disease of nervous system, unspecified: Secondary | ICD-10-CM | POA: Diagnosis not present

## 2022-11-28 DIAGNOSIS — I6782 Cerebral ischemia: Secondary | ICD-10-CM | POA: Diagnosis not present

## 2022-11-28 DIAGNOSIS — G3184 Mild cognitive impairment, so stated: Secondary | ICD-10-CM

## 2022-11-28 MED ORDER — GADOPICLENOL 0.5 MMOL/ML IV SOLN
10.0000 mL | Freq: Once | INTRAVENOUS | Status: AC | PRN
  Administered 2022-11-28: 10 mL via INTRAVENOUS

## 2022-11-29 ENCOUNTER — Ambulatory Visit: Payer: Medicare Other

## 2022-11-29 ENCOUNTER — Telehealth: Payer: Self-pay | Admitting: Gastroenterology

## 2022-11-29 DIAGNOSIS — I480 Paroxysmal atrial fibrillation: Secondary | ICD-10-CM | POA: Diagnosis not present

## 2022-11-29 LAB — CUP PACEART REMOTE DEVICE CHECK
Date Time Interrogation Session: 20240906230302
Implantable Pulse Generator Implant Date: 20211103

## 2022-11-29 NOTE — Telephone Encounter (Signed)
Dr. Chales Abrahams as DOD PM of 11/29/22 please advise, thanks.   Dr. Lanetta Inch patient with a history of GI bleed.   Pt returned call. Pt reports that she first noticed the rectal bleeding Saturday night. Pt also reports (R) abdominal pain all of the way under her ribs to her lower abdomen. Pt takes Dicyclomine daily with no relief. 2 Extra strength Tylenol helps the pain subside. Pt reports that she blood "was pouring out of her". I asked pt if she was just passing blood with no stool, pt stated that it was all liquid. Possible clots or small specs of stool, pt is not sure. Pt reports that the blood in the commode is "blood red", and when she wipes its "dark red with brown pieces". The brown pieces do not have the consistency of clots. Pt has not been on Eliquis for a while. Pt reports that she does have a headache which is new for her. Pt typically has dizziness and SOB, nothing worse that what she is used to. I told pt that you may direct her to ED for further evaluation or may order labs. Pt states that she will not go to [facility] ED, I told her she could go to [facility] or a [facility]. Pt is aware that if she is seen at a [facility] they will transfer her to larger hospital for treatment of possible GI bleed.

## 2022-11-29 NOTE — Telephone Encounter (Signed)
PT has had rectal bleeding since Saturday along with pain under the rib cage. Please advise.

## 2022-11-29 NOTE — Telephone Encounter (Signed)
Lm on vm for patient to return call 

## 2022-11-29 NOTE — Telephone Encounter (Signed)
Agreed Needs eval @ nearest ED RG

## 2022-11-30 ENCOUNTER — Inpatient Hospital Stay (HOSPITAL_COMMUNITY)
Admission: EM | Admit: 2022-11-30 | Discharge: 2022-12-05 | DRG: 378 | Disposition: A | Discharge status: Home / Self Care | Payer: Medicare Other | Attending: Family Medicine | Admitting: Family Medicine

## 2022-11-30 ENCOUNTER — Encounter (HOSPITAL_COMMUNITY): Payer: Self-pay | Admitting: Emergency Medicine

## 2022-11-30 ENCOUNTER — Emergency Department (HOSPITAL_COMMUNITY): Payer: Medicare Other

## 2022-11-30 ENCOUNTER — Other Ambulatory Visit: Payer: Self-pay

## 2022-11-30 DIAGNOSIS — R1310 Dysphagia, unspecified: Secondary | ICD-10-CM | POA: Diagnosis present

## 2022-11-30 DIAGNOSIS — Z8249 Family history of ischemic heart disease and other diseases of the circulatory system: Secondary | ICD-10-CM

## 2022-11-30 DIAGNOSIS — K580 Irritable bowel syndrome with diarrhea: Secondary | ICD-10-CM | POA: Diagnosis present

## 2022-11-30 DIAGNOSIS — Z79899 Other long term (current) drug therapy: Secondary | ICD-10-CM | POA: Diagnosis not present

## 2022-11-30 DIAGNOSIS — Z888 Allergy status to other drugs, medicaments and biological substances status: Secondary | ICD-10-CM

## 2022-11-30 DIAGNOSIS — F411 Generalized anxiety disorder: Secondary | ICD-10-CM | POA: Diagnosis present

## 2022-11-30 DIAGNOSIS — K5730 Diverticulosis of large intestine without perforation or abscess without bleeding: Secondary | ICD-10-CM | POA: Diagnosis present

## 2022-11-30 DIAGNOSIS — Z7901 Long term (current) use of anticoagulants: Secondary | ICD-10-CM

## 2022-11-30 DIAGNOSIS — Z9884 Bariatric surgery status: Secondary | ICD-10-CM

## 2022-11-30 DIAGNOSIS — K922 Gastrointestinal hemorrhage, unspecified: Principal | ICD-10-CM

## 2022-11-30 DIAGNOSIS — K760 Fatty (change of) liver, not elsewhere classified: Secondary | ICD-10-CM | POA: Diagnosis not present

## 2022-11-30 DIAGNOSIS — D696 Thrombocytopenia, unspecified: Secondary | ICD-10-CM | POA: Diagnosis present

## 2022-11-30 DIAGNOSIS — R42 Dizziness and giddiness: Secondary | ICD-10-CM

## 2022-11-30 DIAGNOSIS — D62 Acute posthemorrhagic anemia: Secondary | ICD-10-CM | POA: Diagnosis present

## 2022-11-30 DIAGNOSIS — Z96653 Presence of artificial knee joint, bilateral: Secondary | ICD-10-CM | POA: Diagnosis present

## 2022-11-30 DIAGNOSIS — E785 Hyperlipidemia, unspecified: Secondary | ICD-10-CM | POA: Diagnosis present

## 2022-11-30 DIAGNOSIS — K838 Other specified diseases of biliary tract: Secondary | ICD-10-CM | POA: Diagnosis present

## 2022-11-30 DIAGNOSIS — I480 Paroxysmal atrial fibrillation: Secondary | ICD-10-CM | POA: Diagnosis present

## 2022-11-30 DIAGNOSIS — K625 Hemorrhage of anus and rectum: Secondary | ICD-10-CM | POA: Diagnosis not present

## 2022-11-30 DIAGNOSIS — F03A4 Unspecified dementia, mild, with anxiety: Secondary | ICD-10-CM | POA: Diagnosis present

## 2022-11-30 DIAGNOSIS — E876 Hypokalemia: Secondary | ICD-10-CM | POA: Diagnosis not present

## 2022-11-30 DIAGNOSIS — F03A3 Unspecified dementia, mild, with mood disturbance: Secondary | ICD-10-CM | POA: Diagnosis present

## 2022-11-30 DIAGNOSIS — K5900 Constipation, unspecified: Secondary | ICD-10-CM | POA: Diagnosis present

## 2022-11-30 DIAGNOSIS — Z981 Arthrodesis status: Secondary | ICD-10-CM | POA: Diagnosis not present

## 2022-11-30 DIAGNOSIS — I10 Essential (primary) hypertension: Secondary | ICD-10-CM | POA: Diagnosis present

## 2022-11-30 DIAGNOSIS — Z96643 Presence of artificial hip joint, bilateral: Secondary | ICD-10-CM | POA: Diagnosis present

## 2022-11-30 DIAGNOSIS — F32A Depression, unspecified: Secondary | ICD-10-CM | POA: Diagnosis present

## 2022-11-30 DIAGNOSIS — Z90710 Acquired absence of both cervix and uterus: Secondary | ICD-10-CM

## 2022-11-30 DIAGNOSIS — Z6841 Body Mass Index (BMI) 40.0 and over, adult: Secondary | ICD-10-CM | POA: Diagnosis not present

## 2022-11-30 DIAGNOSIS — R1011 Right upper quadrant pain: Secondary | ICD-10-CM | POA: Diagnosis not present

## 2022-11-30 DIAGNOSIS — K5731 Diverticulosis of large intestine without perforation or abscess with bleeding: Principal | ICD-10-CM

## 2022-11-30 DIAGNOSIS — Z96612 Presence of left artificial shoulder joint: Secondary | ICD-10-CM | POA: Diagnosis present

## 2022-11-30 DIAGNOSIS — Z86010 Personal history of colonic polyps: Secondary | ICD-10-CM

## 2022-11-30 DIAGNOSIS — Z96611 Presence of right artificial shoulder joint: Secondary | ICD-10-CM | POA: Diagnosis present

## 2022-11-30 DIAGNOSIS — K449 Diaphragmatic hernia without obstruction or gangrene: Secondary | ICD-10-CM | POA: Diagnosis not present

## 2022-11-30 DIAGNOSIS — K921 Melena: Secondary | ICD-10-CM | POA: Diagnosis present

## 2022-11-30 DIAGNOSIS — E2749 Other adrenocortical insufficiency: Secondary | ICD-10-CM | POA: Diagnosis not present

## 2022-11-30 DIAGNOSIS — Z9049 Acquired absence of other specified parts of digestive tract: Secondary | ICD-10-CM

## 2022-11-30 DIAGNOSIS — Z0389 Encounter for observation for other suspected diseases and conditions ruled out: Secondary | ICD-10-CM | POA: Diagnosis not present

## 2022-11-30 LAB — COMPREHENSIVE METABOLIC PANEL
ALT: 20 U/L (ref 0–44)
AST: 21 U/L (ref 15–41)
Albumin: 3.4 g/dL — ABNORMAL LOW (ref 3.5–5.0)
Alkaline Phosphatase: 58 U/L (ref 38–126)
Anion gap: 9 (ref 5–15)
BUN: 22 mg/dL (ref 8–23)
CO2: 25 mmol/L (ref 22–32)
Calcium: 9.2 mg/dL (ref 8.9–10.3)
Chloride: 105 mmol/L (ref 98–111)
Creatinine, Ser: 1.02 mg/dL — ABNORMAL HIGH (ref 0.44–1.00)
GFR, Estimated: 57 mL/min — ABNORMAL LOW (ref >60)
Glucose, Bld: 116 mg/dL — ABNORMAL HIGH (ref 70–99)
Potassium: 5.1 mmol/L (ref 3.5–5.1)
Sodium: 139 mmol/L (ref 135–145)
Total Bilirubin: 0.3 mg/dL (ref 0.3–1.2)
Total Protein: 6.1 g/dL — ABNORMAL LOW (ref 6.5–8.1)

## 2022-11-30 LAB — PROTIME-INR
INR: 1 (ref 0.8–1.2)
Prothrombin Time: 13.7 s (ref 11.4–15.2)

## 2022-11-30 LAB — CBC
HCT: 37.2 % (ref 36.0–46.0)
Hemoglobin: 11.8 g/dL — ABNORMAL LOW (ref 12.0–15.0)
MCH: 31.6 pg (ref 26.0–34.0)
MCHC: 31.7 g/dL (ref 30.0–36.0)
MCV: 99.7 fL (ref 80.0–100.0)
Platelets: 225 10*3/uL (ref 150–400)
RBC: 3.73 MIL/uL — ABNORMAL LOW (ref 3.87–5.11)
RDW: 12.6 % (ref 11.5–15.5)
WBC: 7.6 10*3/uL (ref 4.0–10.5)
nRBC: 0 % (ref 0.0–0.2)

## 2022-11-30 LAB — TYPE AND SCREEN
ABO/RH(D): A POS
Antibody Screen: NEGATIVE

## 2022-11-30 LAB — POC OCCULT BLOOD, ED: Fecal Occult Bld: POSITIVE — AB

## 2022-11-30 LAB — LIPASE, BLOOD: Lipase: 36 U/L (ref 11–51)

## 2022-11-30 MED ORDER — DULOXETINE HCL 60 MG PO CPEP
60.0000 mg | ORAL_CAPSULE | Freq: Every day | ORAL | Status: DC
  Administered 2022-12-01 – 2022-12-04 (×4): 60 mg via ORAL
  Filled 2022-11-30 (×5): qty 1

## 2022-11-30 MED ORDER — ACETAMINOPHEN 500 MG PO TABS
1000.0000 mg | ORAL_TABLET | Freq: Once | ORAL | Status: AC
  Administered 2022-11-30: 1000 mg via ORAL
  Filled 2022-11-30: qty 2

## 2022-11-30 MED ORDER — ALPRAZOLAM 0.5 MG PO TABS
0.2500 mg | ORAL_TABLET | Freq: Every day | ORAL | Status: DC | PRN
  Administered 2022-11-30 – 2022-12-04 (×6): 0.25 mg via ORAL
  Filled 2022-11-30 (×6): qty 1

## 2022-11-30 MED ORDER — SODIUM CHLORIDE 0.9 % IV BOLUS
1000.0000 mL | Freq: Once | INTRAVENOUS | Status: AC
  Administered 2022-11-30: 1000 mL via INTRAVENOUS

## 2022-11-30 MED ORDER — LOSARTAN POTASSIUM 50 MG PO TABS
50.0000 mg | ORAL_TABLET | Freq: Every day | ORAL | Status: DC
  Administered 2022-12-01 – 2022-12-04 (×4): 50 mg via ORAL
  Filled 2022-11-30 (×5): qty 1

## 2022-11-30 MED ORDER — ACETAMINOPHEN 650 MG RE SUPP: 650.0000 mg | Freq: Four times a day (QID) | RECTAL | Status: DC | PRN

## 2022-11-30 MED ORDER — SODIUM CHLORIDE 0.9 % IV SOLN: INTRAVENOUS | Status: DC

## 2022-11-30 MED ORDER — LORAZEPAM 2 MG/ML IJ SOLN
1.0000 mg | Freq: Once | INTRAMUSCULAR | Status: AC
  Administered 2022-11-30: 1 mg via INTRAVENOUS
  Filled 2022-11-30: qty 1

## 2022-11-30 MED ORDER — SIMVASTATIN 20 MG PO TABS
20.0000 mg | ORAL_TABLET | Freq: Every day | ORAL | Status: DC
  Administered 2022-12-01: 20 mg via ORAL
  Filled 2022-11-30: qty 1

## 2022-11-30 MED ORDER — PANTOPRAZOLE 80MG IVPB - SIMPLE MED
80.0000 mg | Freq: Once | INTRAVENOUS | Status: AC
  Administered 2022-11-30: 80 mg via INTRAVENOUS
  Filled 2022-11-30: qty 100

## 2022-11-30 MED ORDER — PANTOPRAZOLE INFUSION (NEW) - SIMPLE MED
8.0000 mg/h | INTRAVENOUS | Status: DC
  Administered 2022-11-30 – 2022-12-01 (×2): 8 mg/h via INTRAVENOUS
  Filled 2022-11-30 (×3): qty 100

## 2022-11-30 MED ORDER — ONDANSETRON HCL 4 MG/2ML IJ SOLN
4.0000 mg | Freq: Four times a day (QID) | INTRAMUSCULAR | Status: DC | PRN
  Administered 2022-12-03: 4 mg via INTRAVENOUS
  Filled 2022-11-30: qty 2

## 2022-11-30 MED ORDER — OXYCODONE HCL 5 MG PO TABS: 5.0000 mg | ORAL_TABLET | ORAL | Status: DC | PRN

## 2022-11-30 MED ORDER — ACETAMINOPHEN 325 MG PO TABS
650.0000 mg | ORAL_TABLET | Freq: Four times a day (QID) | ORAL | Status: DC | PRN
  Administered 2022-12-02: 650 mg via ORAL
  Filled 2022-11-30: qty 2

## 2022-11-30 MED ORDER — IOHEXOL 350 MG/ML SOLN
100.0000 mL | Freq: Once | INTRAVENOUS | Status: AC | PRN
  Administered 2022-11-30: 100 mL via INTRAVENOUS

## 2022-11-30 MED ORDER — ONDANSETRON HCL 4 MG PO TABS
4.0000 mg | ORAL_TABLET | Freq: Four times a day (QID) | ORAL | Status: DC | PRN
  Administered 2022-12-01 – 2022-12-04 (×3): 4 mg via ORAL
  Filled 2022-11-30 (×3): qty 1

## 2022-11-30 MED ORDER — DILTIAZEM HCL ER COATED BEADS 240 MG PO CP24
240.0000 mg | ORAL_CAPSULE | Freq: Every day | ORAL | Status: DC
  Administered 2022-12-01 – 2022-12-04 (×4): 240 mg via ORAL
  Filled 2022-11-30 (×6): qty 1

## 2022-11-30 MED ORDER — BUSPIRONE HCL 15 MG PO TABS
7.5000 mg | ORAL_TABLET | Freq: Two times a day (BID) | ORAL | Status: DC
  Administered 2022-11-30 – 2022-12-04 (×9): 7.5 mg via ORAL
  Filled 2022-11-30 (×3): qty 2
  Filled 2022-11-30 (×3): qty 1
  Filled 2022-11-30 (×2): qty 2
  Filled 2022-11-30: qty 1
  Filled 2022-11-30: qty 2
  Filled 2022-11-30 (×3): qty 1
  Filled 2022-11-30: qty 2
  Filled 2022-11-30: qty 1
  Filled 2022-11-30 (×2): qty 2
  Filled 2022-11-30 (×2): qty 1
  Filled 2022-11-30: qty 2

## 2022-11-30 NOTE — ED Triage Notes (Signed)
Patient brought in by wheelchair with spouse c/o rectal bleeding since Friday night. States some bright red blood and some black smiley substance. Patient reports light headedness and back pain onset of yesterday. States she has been having abdominal cramping before episodes of rectal bleeding.

## 2022-11-30 NOTE — ED Provider Triage Note (Signed)
Emergency Medicine Provider Triage Evaluation Note  [name] , a 77 y.o. female  was evaluated in triage.  Pt complains of intermittent rectal bleeding for the last 3 days.  States that it is a mix of bright red blood in the toilet as well as dark black stools.  Also complains of right upper quadrant abdominal pain.  No fever, nausea, vomiting.  Had an episode where she felt lightheaded and dizzy.  No chest pain or shortness of breath.  Has previously had a colonoscopy which found a polyp but no other abnormalities.  States that she has previously had GI bleeding and is followed by GI for this.  She has required blood transfusions before but these were all postop.  Also complains of generalized itching.  No known history of liver problems.  Review of Systems  Positive: See HPI Negative: See HPI  Physical Exam  BP (!) 96/45 (BP Location: Left Arm)   Pulse (!) 106   Temp (!) 97.5 F (36.4 C)   Resp 19   Ht 5\' 2"  (1.575 m)   Wt 105.2 kg   SpO2 100%   BMI 42.43 kg/m  Gen:   Awake, no distress   Resp:  Normal effort  MSK:   Moves extremities without difficulty  Other:  Mild right upper quadrant abdominal tenderness, no rebound, guarding, or peritoneal signs  Medical Decision Making  Medically screening exam initiated at 12:54 PM.  Appropriate orders placed.  [name] was informed that the remainder of the evaluation will be completed by another provider, this initial triage assessment does not replace that evaluation, and the importance of remaining in the ED until their evaluation is complete.     Tonette Lederer, PA-C 11/30/22 1259

## 2022-11-30 NOTE — ED Notes (Signed)
ED TO INPATIENT HANDOFF REPORT  ED Nurse Name and Phone #: Maydelle Kindler RN  [phone]  S Name/Age/Gender [name] 77 y.o. female Room/Bed: 041C/041C  Code Status   Code Status: Full Code  Home/SNF/Other Home Patient oriented to: self, place, time, and situation Is this baseline? Yes   Triage Complete: Triage complete  Chief Complaint Rectal bleeding [K62.5]  Triage Note Patient brought in by wheelchair with spouse c/o rectal bleeding since Friday night. States some bright red blood and some black smiley substance. Patient reports light headedness and back pain onset of yesterday. States she has been having abdominal cramping before episodes of rectal bleeding.    Allergies Allergies  Allergen Reactions   Ativan [Lorazepam] Other (See Comments)    Agitation, aggressive actions, significant mental changes   Cefdinir Hives    headache   Buprenorphine Hcl Itching and Rash    Level of Care/Admitting Diagnosis ED Disposition     ED Disposition  Admit   Condition  --   Comment  Hospital Area: [facility] [[id number]]  Level of Care: Med-Surg [16]  May place patient in observation at [facility] or [facility] if equivalent level of care is available:: Yes  Covid Evaluation: Asymptomatic - no recent exposure (last 10 days) testing not required  Diagnosis: Rectal bleeding [217577]  Admitting Physician: Alan Mulder [[id number]]  Attending Physician: Alan Mulder [[id number]]          B Medical/Surgery History Past Medical History:  Diagnosis Date   Allergy    Anemia    Antral polyp    benign   Anxiety    Arthritis    Asthma    B12 deficiency    Back pain    Chest pain    Constipation    Depression    Diverticulosis    Gallbladder problem    GERD (gastroesophageal reflux disease) 09/09/2011   History of transfusion    HTN (hypertension) 09/09/2011   Hyperlipemia 09/09/2011   Hypertension    IBS (irritable bowel syndrome)     Internal hemorrhoid    Joint pain    Obesity    Osteoarthritis    Paroxysmal atrial fibrillation (HCC)    Pneumonia    PONV (postoperative nausea and vomiting)    with big surgeries, none with endos etc, with colonoscopy- 5-6 years ago could not swallow    Shortness of breath    Sleep apnea    borderline per patient no cpap    SOB (shortness of breath) on exertion    Swallowing difficulty    Tachycardia    Unstable angina (HCC) 09/09/2011   Vitamin D deficiency    Vocal cord dysfunction 2013   swelling   Past Surgical History:  Procedure Laterality Date   ABDOMINAL HYSTERECTOMY  1988   ANKLE FUSION Right    x 2   BACK SURGERY  [phone]   3 ruptured discs, lower back   BILATERAL ANTERIOR TOTAL HIP ARTHROPLASTY Bilateral 11/12/2014   Procedure: BILATERAL ANTERIOR TOTAL HIP ARTHROPLASTY;  Surgeon: Durene Romans, MD;  Location: [facility];  Service: Orthopedics;  Laterality: Bilateral;   BRAVO PH STUDY [name] 03/06/2013   Procedure: BRAVO PH STUDY;  Surgeon: Louis Meckel, MD;  Location: [facility];  Service: Endoscopy;  Laterality: [name];   CARDIAC CATHETERIZATION  2013   NORMAL   CHOLECYSTECTOMY     COLONOSCOPY     ELBOW SURGERY Left    removed bone chip   ESOPHAGOGASTRODUODENOSCOPY [name] 03/06/2013  Procedure: ESOPHAGOGASTRODUODENOSCOPY (EGD);  Surgeon: Louis Meckel, MD;  Location: [facility];  Service: Endoscopy;  Laterality: [name];   FOOT OSTEOTOMY W/ PLANTAR FASCIA RELEASE Left    ILIOTIBIAL BAND RELEASE Right 1997   implantable loop recorder placement  01/23/2020   [employer] Reveal Linq model LNQ 22 (RLB [id number]) implantable loop recorder   JOINT REPLACEMENT     LAPAROSCOPIC GASTRIC SLEEVE RESECTION WITH HIATAL HERNIA REPAIR  08/20/2014   Procedure: LAPAROSCOPIC GASTRIC SLEEVE RESECTION WITH HIATAL HERNIA  REPAIR AND  UPPER ENDOSCOPY;  Surgeon: Luretha Murphy, MD;  Location: [facility];  Service: General;;   LEFT HEART CATHETERIZATION WITH CORONARY ANGIOGRAM Bilateral  09/10/2011   Procedure: LEFT HEART CATHETERIZATION WITH CORONARY ANGIOGRAM;  Surgeon: Runell Gess, MD;  Location: [facility] CATH LAB;  Service: Cardiovascular;  Laterality: Bilateral;   NASAL SINUS SURGERY  1997   REVERSE SHOULDER ARTHROPLASTY Right 05/14/2016   Procedure: RIGHT REVERSE SHOULDER ARTHROPLASTY;  Surgeon: Beverely Low, MD;  Location: [facility] OR;  Service: Orthopedics;  Laterality: Right;   REVERSE SHOULDER ARTHROPLASTY Left 05/16/2020   Procedure: REVERSE SHOULDER ARTHROPLASTY;  Surgeon: Beverely Low, MD;  Location: [facility];  Service: Orthopedics;  Laterality: Left;  interscalene exparel   SHOULDER SURGERY Left    tfc wrist Left 1997   thumb replacement Bilateral    one 2009 and one 2014   TOTAL ANKLE REPLACEMENT Right    x 3   TOTAL ANKLE REPLACEMENT Left    TOTAL ANKLE REPLACEMENT     TOTAL HIP ARTHROPLASTY Bilateral    TOTAL KNEE ARTHROPLASTY Bilateral    left 2002, right 2012   TOTAL SHOULDER ARTHROPLASTY Right 05/14/2016   TRANSTHORACIC ECHOCARDIOGRAM  09/09/2011   MILD CONCENTRIC LVH. TRACE MITRAL REGURG. TRACE TR. MILD AORTIC REGURG.   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   UPPER GI ENDOSCOPY  08/20/2014   Procedure: UPPER GI ENDOSCOPY;  Surgeon: Luretha Murphy, MD;  Location: [facility];  Service: General;;   WRIST SURGERY Left 1997, 2009      A IV Location/Drains/Wounds Patient Lines/Drains/Airways Status     Active Line/Drains/Airways     Name Placement date Placement time Site Days   Peripheral IV 11/30/22 20 G Right Antecubital 11/30/22  1530  Antecubital  less than 1   Peripheral IV 11/30/22 20 G Anterior;Left Forearm 11/30/22  1618  Forearm  less than 1            Intake/Output Last 24 hours  Intake/Output Summary (Last 24 hours) at 11/30/2022 1926 Last data filed at 11/30/2022 1825 Gross per 24 hour  Intake 1000 ml  Output --  Net 1000 ml    Labs/Imaging Results for orders placed or performed during the hospital encounter of 11/30/22 (from the  past 48 hour(s))  Comprehensive metabolic panel     Status: Abnormal   Collection Time: 11/30/22 12:24 PM  Result Value Ref Range   Sodium 139 135 - 145 mmol/L   Potassium 5.1 3.5 - 5.1 mmol/L   Chloride 105 98 - 111 mmol/L   CO2 25 22 - 32 mmol/L   Glucose, Bld 116 (H) 70 - 99 mg/dL    Comment: Glucose reference range applies only to samples taken after fasting for at least 8 hours.   BUN 22 8 - 23 mg/dL   Creatinine, Ser 2.44 (H) 0.44 - 1.00 mg/dL   Calcium 9.2 8.9 - 01.0 mg/dL   Total Protein 6.1 (L) 6.5 - 8.1 g/dL   Albumin  3.4 (L) 3.5 - 5.0 g/dL   AST 21 15 - 41 U/L   ALT 20 0 - 44 U/L   Alkaline Phosphatase 58 38 - 126 U/L   Total Bilirubin 0.3 0.3 - 1.2 mg/dL   GFR, Estimated 57 (L) >60 mL/min    Comment: (NOTE) Calculated using the CKD-EPI Creatinine Equation (2021)    Anion gap 9 5 - 15    Comment: Performed at [facility], [address], [place], [place] [address]  CBC     Status: Abnormal   Collection Time: 11/30/22 12:24 PM  Result Value Ref Range   WBC 7.6 4.0 - 10.5 K/uL   RBC 3.73 (L) 3.87 - 5.11 MIL/uL   Hemoglobin 11.8 (L) 12.0 - 15.0 g/dL   HCT 17.5 10.2 - 58.5 %   MCV 99.7 80.0 - 100.0 fL   MCH 31.6 26.0 - 34.0 pg   MCHC 31.7 30.0 - 36.0 g/dL   RDW 27.7 82.4 - 23.5 %   Platelets 225 150 - 400 K/uL   nRBC 0.0 0.0 - 0.2 %    Comment: Performed at [facility], [address] [place], [place] [address]  Type and screen [facility]     Status: None   Collection Time: 11/30/22 12:24 PM  Result Value Ref Range   ABO/RH(D) A POS    Antibody Screen NEG    Sample Expiration      12/03/2022,2359 Performed at [facility], [address] [place], [place] [address]   Lipase, blood     Status: None   Collection Time: 11/30/22 12:42 PM  Result Value Ref Range   Lipase 36 11 - 51 U/L    Comment: Performed at [facility], [address], [place], [place] [address]  POC occult blood, ED     Status: Abnormal    Collection Time: 11/30/22  4:16 PM  Result Value Ref Range   Fecal Occult Bld POSITIVE (A) NEGATIVE  Protime-INR     Status: None   Collection Time: 11/30/22  4:20 PM  Result Value Ref Range   Prothrombin Time 13.7 11.4 - 15.2 seconds   INR 1.0 0.8 - 1.2    Comment: (NOTE) INR goal varies based on device and disease states. Performed at [facility], [address] [place], [place] [address]    CT ANGIO GI BLEED  Result Date: 11/30/2022 CLINICAL DATA:  Rectal bleeding EXAM: CTA ABDOMEN AND PELVIS WITHOUT AND WITH CONTRAST TECHNIQUE: Multidetector CT imaging of the abdomen and pelvis was performed using the standard protocol during bolus administration of intravenous contrast. Multiplanar reconstructed images and MIPs were obtained and reviewed to evaluate the vascular anatomy. RADIATION DOSE REDUCTION: This exam was performed according to the departmental dose-optimization program which includes automated exposure control, adjustment of the mA and/or kV according to patient size and/or use of iterative reconstruction technique. CONTRAST:  OMNIPAQUE IOHEXOL 350 MG/ML SOLN COMPARISON:  08/07/2020 and previous FINDINGS: VASCULAR Aorta: Moderate partially calcified atheromatous plaque without aneurysm, dissection, for for Celiac: Calcified ostial plaque without significant stenosis, patent distally. SMA: Eccentric short-segment calcified ostial plaque without high-grade stenosis, patent distally with classic branch anatomy. Renals: Single bilaterally, both with calcified ostial plaque resulting in mild stenosis, patent distally. IMA: Patent without evidence of aneurysm, dissection, vasculitis or significant stenosis. Inflow: Mild scattered calcified plaque in bilateral common and internal iliac arteries. No aneurysm or stenosis. External iliac arteries normal. Proximal  Outflow: Bilateral common femoral and visualized portions of the superficial and profunda femoral arteries are patent  without evidence of aneurysm, dissection, vasculitis or significant stenosis. Veins: Patent hepatic veins, portal vein, SMV, splenic vein, bilateral renal veins, iliac venous system and IVC. Review of the MIP images confirms the above findings. NON-VASCULAR Lower chest: No pleural or pericardial effusion. Visualized lung bases clear. Hepatobiliary: No focal liver abnormality is seen. Status post cholecystectomy. Ectatic central intrahepatic biliary tree and CBD, stable. Pancreas: Unremarkable. No pancreatic ductal dilatation or surrounding inflammatory changes. Spleen: Normal in size without focal abnormality. Adrenals/Urinary Tract: Chronic linear right adrenal calcifications without mass. No urolithiasis. No hydronephrosis. Stable 1.5 cm left upper pole cyst; no follow-up recommended. Urinary bladder physiologically distended. Stomach/Bowel: No active GI bleed. Moderate hiatal hernia. Post gastric bypass surgery. Small bowel decompressed. Appendix not identified. The colon is partially distended with multiple scattered diverticula throughout, most numerous in the distal descending and sigmoid segments, without adjacent inflammatory change. Lymphatic: No abdominal or pelvic adenopathy. Reproductive: Status post hysterectomy. No adnexal masses. Other: No ascites.  No free air. Musculoskeletal: Mild spondylitic changes throughout the visualized lower thoracic and lumbar spine. Bilateral hip arthroplasty components project in expected location. No fracture or worrisome bone lesion. IMPRESSION: 1. No active GI bleed. 2. Colonic diverticulosis without acute inflammation. 3. Moderate hiatal hernia. 4.  Aortic Atherosclerosis (ICD10-I70.0). Electronically Signed   By: Corlis Leak M.D.   On: 11/30/2022 19:06    Pending Labs Unresulted Labs (From admission, onward)     Start     Ordered   12/01/22 0500  CBC  Tomorrow morning,   R        11/30/22 1919   12/01/22 0500  Basic metabolic panel  Tomorrow morning,   R         11/30/22 1919            Vitals/Pain Today's Vitals   11/30/22 1545 11/30/22 1617 11/30/22 1619 11/30/22 1630  BP: 126/75  129/63 (!) 133/54  Pulse: 80  77 78  Resp: 16  (!) 24 19  Temp:  97.7 F (36.5 C)    TempSrc:  Oral    SpO2: 100%  98% 100%  Weight:      Height:      PainSc:        Isolation Precautions No active isolations  Medications Medications  pantoprozole (PROTONIX) 80 mg /NS 100 mL infusion (8 mg/hr Intravenous New Bag/Given 11/30/22 1638)  diltiazem (CARDIZEM CD) 24 hr capsule 240 mg (has no administration in time range)  losartan (COZAAR) tablet 50 mg (has no administration in time range)  simvastatin (ZOCOR) tablet 20 mg (has no administration in time range)  busPIRone (BUSPAR) tablet 7.5 mg (has no administration in time range)  DULoxetine (CYMBALTA) DR capsule 60 mg (has no administration in time range)  0.9 %  sodium chloride infusion (has no administration in time range)  acetaminophen (TYLENOL) tablet 650 mg (has no administration in time range)    Or  acetaminophen (TYLENOL) suppository 650 mg (has no administration in time range)  oxyCODONE (Oxy IR/ROXICODONE) immediate release tablet 5 mg (has no administration in time range)  ondansetron (ZOFRAN) tablet 4 mg (has no administration in time range)    Or  ondansetron (ZOFRAN) injection 4 mg (has no administration in time range)  acetaminophen (TYLENOL) tablet 1,000 mg (1,000 mg Oral Given 11/30/22 1303)  sodium chloride 0.9 % bolus 1,000 mL (0 mLs Intravenous Stopped 11/30/22 1825)  pantoprazole (PROTONIX)  80 mg /NS 100 mL IVPB (0 mg Intravenous Stopped 11/30/22 1637)  LORazepam (ATIVAN) injection 1 mg (1 mg Intravenous Given 11/30/22 1650)  iohexol (OMNIPAQUE) 350 MG/ML injection 100 mL (100 mLs Intravenous Contrast Given 11/30/22 1823)    Mobility walks     Focused Assessments Neuro Assessment Handoff:  Swallow screen pass? Yes          Neuro Assessment:   Neuro Checks:      Has  TPA been given? No If patient is a Neuro Trauma and patient is going to OR before floor call report to 4N Charge nurse: [phone] or [phone]   R Recommendations: See Admitting Provider Note  Report given to:   Additional Notes: family with patient, pt normally walks on her own but is dizzy

## 2022-11-30 NOTE — H&P (Signed)
History and Physical    [name] HKV:[id number] DOB: [birth year] DOA: 11/30/2022  PCP: Cleatis Polka., MD   Chief Complaint: Rectal bleeding  HPI: [name] is a 77 y.o. female with medical history significant of GERD, hypertension, hyperlipidemia, IBS, paroxysmal A-fib presented to the emergency department due to 3 days of bright red blood per rectum.  Patient had generalized abdominal pain without any fever or chills.  She is felt lightheaded and dizzy and subsequently developed shortness of breath and rectal bleeding.  She describes it as bright red fairly associated with defecation.  She presented to the ER for further assessment.  On arrival she was afebrile and hemodynamically stable.  Labs were obtained which showed CMP unrevealing, WBC 7.6, hemoglobin 11.8 baseline around 11. CT GI bleed study was obtained which showed no acute GI blood loss.  Patient was admitted for further workup.  On evaluation she states she has had longstanding episodes of large bloody bowel movements that seem to self resolve dating back several months.  She denies any infectious complaints including fever or chills.  Review of Systems: Review of Systems  Constitutional: Negative.  Negative for chills and fever.  HENT: Negative.    Respiratory: Negative.    Cardiovascular: Negative.   Gastrointestinal:  Negative for heartburn and nausea.  Genitourinary: Negative.   Musculoskeletal: Negative.   Skin: Negative.   Neurological: Negative.   Endo/Heme/Allergies: Negative.   Psychiatric/Behavioral: Negative.    All other systems reviewed and are negative.    As per HPI otherwise 10 point review of systems negative.   Allergies  Allergen Reactions   Ativan [Lorazepam] Other (See Comments)    Agitation, aggressive actions, significant mental changes   Cefdinir Hives    headache   Buprenorphine Hcl Itching and Rash    Past Medical History:  Diagnosis Date   Allergy    Anemia     Antral polyp    benign   Anxiety    Arthritis    Asthma    B12 deficiency    Back pain    Chest pain    Constipation    Depression    Diverticulosis    Gallbladder problem    GERD (gastroesophageal reflux disease) 09/09/2011   History of transfusion    HTN (hypertension) 09/09/2011   Hyperlipemia 09/09/2011   Hypertension    IBS (irritable bowel syndrome)    Internal hemorrhoid    Joint pain    Obesity    Osteoarthritis    Paroxysmal atrial fibrillation (HCC)    Pneumonia    PONV (postoperative nausea and vomiting)    with big surgeries, none with endos etc, with colonoscopy- 5-6 years ago could not swallow    Shortness of breath    Sleep apnea    borderline per patient no cpap    SOB (shortness of breath) on exertion    Swallowing difficulty    Tachycardia    Unstable angina (HCC) 09/09/2011   Vitamin D deficiency    Vocal cord dysfunction 2013   swelling    Past Surgical History:  Procedure Laterality Date   ABDOMINAL HYSTERECTOMY  1988   ANKLE FUSION Right    x 2   BACK SURGERY  [phone]   3 ruptured discs, lower back   BILATERAL ANTERIOR TOTAL HIP ARTHROPLASTY Bilateral 11/12/2014   Procedure: BILATERAL ANTERIOR TOTAL HIP ARTHROPLASTY;  Surgeon: Durene Romans, MD;  Location: [facility];  Service: Orthopedics;  Laterality: Bilateral;   BRAVO PH  STUDY [name] 03/06/2013   Procedure: BRAVO PH STUDY;  Surgeon: Louis Meckel, MD;  Location: [facility];  Service: Endoscopy;  Laterality: [name];   CARDIAC CATHETERIZATION  2011-12-09   NORMAL   CHOLECYSTECTOMY     COLONOSCOPY     ELBOW SURGERY Left    removed bone chip   ESOPHAGOGASTRODUODENOSCOPY [name] 03/06/2013   Procedure: ESOPHAGOGASTRODUODENOSCOPY (EGD);  Surgeon: Louis Meckel, MD;  Location: [facility];  Service: Endoscopy;  Laterality: [name];   FOOT OSTEOTOMY W/ PLANTAR FASCIA RELEASE Left    ILIOTIBIAL BAND RELEASE Right 1997   implantable loop recorder placement  01/23/2020   [employer] Reveal Linq model LNQ 22  (RLB [id number]) implantable loop recorder   JOINT REPLACEMENT     LAPAROSCOPIC GASTRIC SLEEVE RESECTION WITH HIATAL HERNIA REPAIR  08/20/2014   Procedure: LAPAROSCOPIC GASTRIC SLEEVE RESECTION WITH HIATAL HERNIA  REPAIR AND  UPPER ENDOSCOPY;  Surgeon: Luretha Murphy, MD;  Location: [facility];  Service: General;;   LEFT HEART CATHETERIZATION WITH CORONARY ANGIOGRAM Bilateral 09/10/2011   Procedure: LEFT HEART CATHETERIZATION WITH CORONARY ANGIOGRAM;  Surgeon: Runell Gess, MD;  Location: [facility] CATH LAB;  Service: Cardiovascular;  Laterality: Bilateral;   NASAL SINUS SURGERY  1997   REVERSE SHOULDER ARTHROPLASTY Right 05/14/2016   Procedure: RIGHT REVERSE SHOULDER ARTHROPLASTY;  Surgeon: Beverely Low, MD;  Location: [facility] OR;  Service: Orthopedics;  Laterality: Right;   REVERSE SHOULDER ARTHROPLASTY Left 05/16/2020   Procedure: REVERSE SHOULDER ARTHROPLASTY;  Surgeon: Beverely Low, MD;  Location: [facility];  Service: Orthopedics;  Laterality: Left;  interscalene exparel   SHOULDER SURGERY Left    tfc wrist Left 1997   thumb replacement Bilateral    one 2007/12/09 and one 2014   TOTAL ANKLE REPLACEMENT Right    x 3   TOTAL ANKLE REPLACEMENT Left    TOTAL ANKLE REPLACEMENT     TOTAL HIP ARTHROPLASTY Bilateral    TOTAL KNEE ARTHROPLASTY Bilateral    left 08-Dec-2000, right 12-09-2010   TOTAL SHOULDER ARTHROPLASTY Right 05/14/2016   TRANSTHORACIC ECHOCARDIOGRAM  09/09/2011   MILD CONCENTRIC LVH. TRACE MITRAL REGURG. TRACE TR. MILD AORTIC REGURG.   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 She has never used smokeless tobacco. She reports that she does not drink alcohol and does not use drugs.  Family History  Problem Relation Age of Onset   Hypertension Mother    Diabetes Mother    Arthritis Mother    Esophageal  cancer Mother    Cancer Mother    Esophageal cancer Father 32   Cancer Father    Alcoholism Father    Other Sister        kidney mass removed   Hypertension Sister    Stroke Sister        died in Dec 09, 2018  Heart disease Maternal Grandmother    Stroke Paternal Grandmother    Coronary artery disease Neg Hx    Colon cancer Neg Hx    Liver disease Neg Hx    Kidney disease Neg Hx    Rectal cancer Neg Hx    Stomach cancer Neg Hx     Prior to Admission medications   Medication Sig Start Date End Date Taking? Authorizing Provider  busPIRone (BUSPAR) 7.5 MG tablet Take 7.5 mg by mouth 2 (two) times daily. 11/17/22  Yes [provider]  diltiazem (CARDIZEM CD) 240 MG 24 hr capsule TAKE 1 CAPSULE BY MOUTH EVERY DAY Patient taking differently: Take 240 mg by mouth daily. 10/05/22  Yes Runell Gess, MD  acetaminophen (TYLENOL) 500 MG tablet Take 1,000 mg by mouth every 6 (six) hours as needed (for pain.).     [provider]  albuterol (PROVENTIL HFA;VENTOLIN HFA) 108 (90 Base) MCG/ACT inhaler Inhale 2 puffs into the lungs every 6 (six) hours as needed for shortness of breath or wheezing.    [provider]  ALPRAZolam Prudy Feeler) 0.25 MG tablet Take 0.25 mg by mouth daily as needed for anxiety.    [provider]  BIOTIN PO Take 1 tablet by mouth daily.    [provider]  calcium carbonate (TUMS - DOSED IN MG ELEMENTAL CALCIUM) 500 MG chewable tablet Chew 1,500 mg by mouth daily as needed for indigestion or heartburn.    [provider]  Cholecalciferol (DIALYVITE VITAMIN D 5000) 125 MCG (5000 UT) capsule Take 5,000 Units by mouth 2 (two) times daily.    [provider]  colestipol (COLESTID) 1 g tablet Take once daily and titrate as needed Patient not taking: Reported on 11/19/2022 01/26/22   Benancio Deeds, MD  diclofenac (VOLTAREN) 75 MG EC tablet Take 75 mg by mouth as needed. Patient not taking: Reported on 11/19/2022     [provider]  dicyclomine (BENTYL) 10 MG capsule TAKE 1 CAPSULE BY MOUTH TWICE DAILY AS NEEDED Patient taking differently: Take 10 mg by mouth 2 (two) times daily as needed for spasms. 11/08/22   Armbruster, Willaim Rayas, MD  Docusate Sodium (COLACE PO) Take 1 tablet by mouth 2 (two) times daily. Patient not taking: Reported on 11/19/2022    [provider]  DULoxetine (CYMBALTA) 60 MG capsule Take 60 mg by mouth daily. Tapering down to 30mg  Patient not taking: Reported on 11/19/2022 09/12/19   [provider]  esomeprazole (NEXIUM) 40 MG capsule TAKE 1 CAPSULE BY MOUTH TWICE A DAY BEFOR A MEAL Patient not taking: Reported on 11/19/2022 11/13/22   Benancio Deeds, MD  fluticasone (FLONASE) 50 MCG/ACT nasal spray Place into both nostrils daily.    [provider]  loratadine (CLARITIN) 10 MG tablet Take 10 mg by mouth daily.    [provider]  losartan (COZAAR) 50 MG tablet Take 50 mg by mouth daily. 09/24/19   [provider]  Melatonin 10 MG TABS Take 10 mg by mouth at bedtime.    [provider]  memantine (NAMENDA) 5 MG tablet Take 1 tablet (5 mg total) by mouth at bedtime. Patient not taking: Reported on 11/19/2022 11/12/22   Marcos Eke, PA-C  Multiple Vitamin (MULTIVITAMIN WITH MINERALS) TABS tablet Take 1 tablet by mouth daily. Centrum    [provider]  ondansetron (ZOFRAN-ODT) 8 MG disintegrating tablet DISSOLVE 1 TABLET IN MOUTH EVERY 8 HRS AS NEEDED NAUSEA Patient taking differently: Take 8 mg by mouth every 8 (eight) hours as needed for nausea. 10/07/22   Armbruster, Willaim Rayas, MD  simvastatin (ZOCOR) 20 MG tablet Take 1 tablet (20 mg total) by mouth daily. 08/25/16   Runell Gess, MD  vitamin B-12 (CYANOCOBALAMIN) 1000 MCG tablet Take 3,000 mcg by mouth daily.    [provider]    Physical Exam: Vitals:   11/30/22 1545 11/30/22 1617 11/30/22 1619  11/30/22 1630  BP: 126/75  129/63 (!) 133/54   Pulse: 80  77 78  Resp: 16  (!) 24 19  Temp:  97.7 F (36.5 C)    TempSrc:  Oral    SpO2: 100%  98% 100%  Weight:      Height:       Physical Exam Vitals reviewed.  Constitutional:      Appearance: She is normal weight.  HENT:     Head: Normocephalic.     Mouth/Throat:     Mouth: Mucous membranes are moist.     Pharynx: Oropharynx is clear.  Eyes:     Pupils: Pupils are equal, round, and reactive to light.  Cardiovascular:     Rate and Rhythm: Normal rate and regular rhythm.     Pulses: Normal pulses.  Pulmonary:     Effort: Pulmonary effort is normal.  Abdominal:     General: Abdomen is flat. Bowel sounds are normal.  Musculoskeletal:        General: Normal range of motion.     Cervical back: Normal range of motion.  Skin:    General: Skin is warm and dry.  Neurological:     General: No focal deficit present.     Mental Status: She is alert and oriented to person, place, and time.  Psychiatric:        Mood and Affect: Mood normal.        Labs on Admission: I have personally reviewed the patients's labs and imaging studies.  Assessment/Plan Principal Problem:   Rectal bleeding   # Acute blood loss anemia secondary to lower GI bleed, POA, active - Patient has hematochezia with hemoglobin at baseline - GI consulted in the emergency department and will assess the patient -Prior endoscopic evaluation in 2021 which showed colon polyps and evidence of gastric sleeve with empiric dilation due to dysphagia. Pain: Start clear liquid diet Trend hemoglobin Appreciate GI recommendations  # Paroxysmal A-fib-continue diltiazem.  Patient not on systemic anticoagulation  # Hyperlipidemia-continue statin  # Hypertension-continue losartan  # Generalized anxiety-continue buspirone, Cymbalta  Admission status: Observation Med-Surg  Certification: The appropriate patient status for this patient is OBSERVATION. Observation status is judged to be reasonable and  necessary in order to provide the required intensity of service to ensure the patient's safety. The patient's presenting symptoms, physical exam findings, and initial radiographic and laboratory data in the context of their medical condition is felt to place them at decreased risk for further clinical deterioration. Furthermore, it is anticipated that the patient will be medically stable for discharge from the hospital within 2 midnights of admission.     Alan Mulder MD [facility] If 7PM-7AM, please contact night-coverage [email or web address]  11/30/2022, 7:19 PM

## 2022-11-30 NOTE — ED Notes (Signed)
Patient transported to CT 

## 2022-11-30 NOTE — Telephone Encounter (Signed)
Called and spoke with patient. I advised pt that she needs ED evaluation today. Pt complained about going to [facility] due to her experience the last time. I informed pt that she can go to [facility] as we have providers that round at that hospital as well. Pt questioned wether or not she could go to a [facility], I informed pt that the [facility] could initiate her workup but they do not provide any specialty services such as colonoscopy or GI consults. I again informed pt that she would likely be transferred to a larger hospital for further evaluation. Pt wanted to know why she could not come here, I explained to her that if she is having a GI bleed we do not have the resources as an outpatient office to handle that situation, best place again for her is ED as DOD previously recommended. Pt states that she will discuss with her husband when he returns home. Pt verbalized understanding and had no concerns at the end of the call.

## 2022-11-30 NOTE — ED Provider Notes (Signed)
[place] EMERGENCY DEPARTMENT AT [facility] Provider Note   CSN: [id number] Arrival date & time: 11/30/22  1114     History  Chief Complaint  Patient presents with   Rectal Bleeding   Dizziness    [name] is a 77 y.o. female.   Rectal Bleeding Associated symptoms: light-headedness   Dizziness    77 year old female with medical history significant for diverticular bleeds, diverticulitis, colon polyps, hemorrhoids who presents to the emergency department with bright red blood per rectum for the last 3 days.  She has also had dark black stools.  She endorses right upper quadrant pain and discomfort.  No fevers, nausea or vomiting.  She is felt lightheaded and dizzy.  She denies any chest pain or shortness of breath.  She states that she has previously had a colonoscopy which found polyps.  She does endorse a history of diverticulosis.  She has followed outpatient with [facility] in the past.  Home Medications Prior to Admission medications   Medication Sig Start Date End Date Taking? Authorizing Provider  busPIRone (BUSPAR) 7.5 MG tablet Take 7.5 mg by mouth 2 (two) times daily. 11/17/22  Yes [provider]  diltiazem (CARDIZEM CD) 240 MG 24 hr capsule TAKE 1 CAPSULE BY MOUTH EVERY DAY Patient taking differently: Take 240 mg by mouth daily. 10/05/22  Yes Runell Gess, MD  acetaminophen (TYLENOL) 500 MG tablet Take 1,000 mg by mouth every 6 (six) hours as needed (for pain.).     [provider]  albuterol (PROVENTIL HFA;VENTOLIN HFA) 108 (90 Base) MCG/ACT inhaler Inhale 2 puffs into the lungs every 6 (six) hours as needed for shortness of breath or wheezing.    [provider]  ALPRAZolam Prudy Feeler) 0.25 MG tablet Take 0.25 mg by mouth daily as needed for anxiety.    [provider]  BIOTIN PO Take 1 tablet by mouth daily.    [provider]  calcium carbonate (TUMS - DOSED IN MG ELEMENTAL CALCIUM) 500 MG chewable  tablet Chew 1,500 mg by mouth daily as needed for indigestion or heartburn.    [provider]  Cholecalciferol (DIALYVITE VITAMIN D 5000) 125 MCG (5000 UT) capsule Take 5,000 Units by mouth 2 (two) times daily.    [provider]  colestipol (COLESTID) 1 g tablet Take once daily and titrate as needed Patient not taking: Reported on 11/19/2022 01/26/22   Benancio Deeds, MD  diclofenac (VOLTAREN) 75 MG EC tablet Take 75 mg by mouth as needed. Patient not taking: Reported on 11/19/2022    [provider]  dicyclomine (BENTYL) 10 MG capsule TAKE 1 CAPSULE BY MOUTH TWICE DAILY AS NEEDED Patient taking differently: Take 10 mg by mouth 2 (two) times daily as needed for spasms. 11/08/22   Armbruster, Willaim Rayas, MD  Docusate Sodium (COLACE PO) Take 1 tablet by mouth 2 (two) times daily. Patient not taking: Reported on 11/19/2022    [provider]  DULoxetine (CYMBALTA) 60 MG capsule Take 60 mg by mouth daily. Tapering down to 30mg  Patient not taking: Reported on 11/19/2022 09/12/19   [provider]  esomeprazole (NEXIUM) 40 MG capsule TAKE 1 CAPSULE BY MOUTH TWICE A DAY BEFOR A MEAL Patient not taking: Reported on 11/19/2022 11/13/22   Benancio Deeds, MD  fluticasone (FLONASE) 50 MCG/ACT nasal spray Place into both nostrils daily.    [provider]  loratadine (CLARITIN) 10 MG tablet Take 10 mg by mouth daily.  [provider]  losartan (COZAAR) 50 MG tablet Take 50 mg by mouth daily. 09/24/19   [provider]  Melatonin 10 MG TABS Take 10 mg by mouth at bedtime.    [provider]  memantine (NAMENDA) 5 MG tablet Take 1 tablet (5 mg total) by mouth at bedtime. Patient not taking: Reported on 11/19/2022 11/12/22   Marcos Eke, PA-C  Multiple Vitamin (MULTIVITAMIN WITH MINERALS) TABS tablet Take 1 tablet by mouth daily. Centrum    [provider]  ondansetron (ZOFRAN-ODT) 8 MG disintegrating tablet DISSOLVE  1 TABLET IN MOUTH EVERY 8 HRS AS NEEDED NAUSEA Patient taking differently: Take 8 mg by mouth every 8 (eight) hours as needed for nausea. 10/07/22   Armbruster, Willaim Rayas, MD  simvastatin (ZOCOR) 20 MG tablet Take 1 tablet (20 mg total) by mouth daily. 08/25/16   Runell Gess, MD  vitamin B-12 (CYANOCOBALAMIN) 1000 MCG tablet Take 3,000 mcg by mouth daily.    [provider]      Allergies    Ativan [lorazepam], Cefdinir, and Buprenorphine hcl    Review of Systems   Review of Systems  Gastrointestinal:  Positive for anal bleeding and hematochezia.  Neurological:  Positive for light-headedness.  All other systems reviewed and are negative.   Physical Exam Updated Vital Signs BP (!) 133/54   Pulse 78   Temp 97.7 F (36.5 C) (Oral)   Resp 19   Ht 5\' 2"  (1.575 m)   Wt 105.2 kg   SpO2 100%   BMI 42.43 kg/m  Physical Exam Vitals and nursing note reviewed. Exam conducted with a chaperone present.  Constitutional:      General: She is not in acute distress.    Appearance: She is well-developed.  HENT:     Head: Normocephalic and atraumatic.  Eyes:     Conjunctiva/sclera: Conjunctivae normal.  Cardiovascular:     Rate and Rhythm: Normal rate and regular rhythm.  Pulmonary:     Effort: Pulmonary effort is normal. No respiratory distress.     Breath sounds: Normal breath sounds.  Abdominal:     Palpations: Abdomen is soft.     Tenderness: There is no abdominal tenderness.  Genitourinary:    Comments: Hematochezia present on the exam glove, fecal occult positive no active hemorrhoidal bleed Musculoskeletal:        General: No swelling.     Cervical back: Neck supple.  Skin:    General: Skin is warm and dry.     Capillary Refill: Capillary refill takes less than 2 seconds.  Neurological:     Mental Status: She is alert.  Psychiatric:        Mood and Affect: Mood normal.     ED Results / Procedures / Treatments   Labs (all labs ordered are listed, but only  abnormal results are displayed) Labs Reviewed  COMPREHENSIVE METABOLIC PANEL - Abnormal; Notable for the following components:      Result Value   Glucose, Bld 116 (*)    Creatinine, Ser 1.02 (*)    Total Protein 6.1 (*)    Albumin 3.4 (*)    GFR, Estimated 57 (*)    All other components within normal limits  CBC - Abnormal; Notable for the following components:   RBC 3.73 (*)    Hemoglobin 11.8 (*)    All other components within normal limits  POC OCCULT BLOOD, ED - Abnormal; Notable for the following components:   Fecal Occult Bld POSITIVE (*)  All other components within normal limits  LIPASE, BLOOD  PROTIME-INR  TYPE AND SCREEN    EKG EKG Interpretation Date/Time:  Tuesday November 30 2022 13:06:31 EDT Ventricular Rate:  93 PR Interval:  140 QRS Duration:  88 QT Interval:  370 QTC Calculation: 460 R Axis:   -56  Text Interpretation: Sinus rhythm with Premature atrial complexes Left anterior fascicular block Moderate voltage criteria for LVH, may be normal variant ( R in aVL , Cornell product ) Anterior infarct , age undetermined Abnormal ECG When compared with ECG of 30-Jul-2020 11:31, PREVIOUS ECG IS PRESENT Confirmed by Ernie Avena ([id number]) on 11/30/2022 4:23:52 PM  Radiology CT ANGIO GI BLEED  Result Date: 11/30/2022 CLINICAL DATA:  Rectal bleeding EXAM: CTA ABDOMEN AND PELVIS WITHOUT AND WITH CONTRAST TECHNIQUE: Multidetector CT imaging of the abdomen and pelvis was performed using the standard protocol during bolus administration of intravenous contrast. Multiplanar reconstructed images and MIPs were obtained and reviewed to evaluate the vascular anatomy. RADIATION DOSE REDUCTION: This exam was performed according to the departmental dose-optimization program which includes automated exposure control, adjustment of the mA and/or kV according to patient size and/or use of iterative reconstruction technique. CONTRAST:  OMNIPAQUE IOHEXOL 350 MG/ML SOLN COMPARISON:   08/07/2020 and previous FINDINGS: VASCULAR Aorta: Moderate partially calcified atheromatous plaque without aneurysm, dissection, for for Celiac: Calcified ostial plaque without significant stenosis, patent distally. SMA: Eccentric short-segment calcified ostial plaque without high-grade stenosis, patent distally with classic branch anatomy. Renals: Single bilaterally, both with calcified ostial plaque resulting in mild stenosis, patent distally. IMA: Patent without evidence of aneurysm, dissection, vasculitis or significant stenosis. Inflow: Mild scattered calcified plaque in bilateral common and internal iliac arteries. No aneurysm or stenosis. External iliac arteries normal. Proximal Outflow: Bilateral common femoral and visualized portions of the superficial and profunda femoral arteries are patent without evidence of aneurysm, dissection, vasculitis or significant stenosis. Veins: Patent hepatic veins, portal vein, SMV, splenic vein, bilateral renal veins, iliac venous system and IVC. Review of the MIP images confirms the above findings. NON-VASCULAR Lower chest: No pleural or pericardial effusion. Visualized lung bases clear. Hepatobiliary: No focal liver abnormality is seen. Status post cholecystectomy. Ectatic central intrahepatic biliary tree and CBD, stable. Pancreas: Unremarkable. No pancreatic ductal dilatation or surrounding inflammatory changes. Spleen: Normal in size without focal abnormality. Adrenals/Urinary Tract: Chronic linear right adrenal calcifications without mass. No urolithiasis. No hydronephrosis. Stable 1.5 cm left upper pole cyst; no follow-up recommended. Urinary bladder physiologically distended. Stomach/Bowel: No active GI bleed. Moderate hiatal hernia. Post gastric bypass surgery. Small bowel decompressed. Appendix not identified. The colon is partially distended with multiple scattered diverticula throughout, most numerous in the distal descending and sigmoid segments, without  adjacent inflammatory change. Lymphatic: No abdominal or pelvic adenopathy. Reproductive: Status post hysterectomy. No adnexal masses. Other: No ascites.  No free air. Musculoskeletal: Mild spondylitic changes throughout the visualized lower thoracic and lumbar spine. Bilateral hip arthroplasty components project in expected location. No fracture or worrisome bone lesion. IMPRESSION: 1. No active GI bleed. 2. Colonic diverticulosis without acute inflammation. 3. Moderate hiatal hernia. 4.  Aortic Atherosclerosis (ICD10-I70.0). Electronically Signed   By: Corlis Leak M.D.   On: 11/30/2022 19:06    Procedures .Critical Care  Performed by: Ernie Avena, MD Authorized by: Ernie Avena, MD   Critical care provider statement:    Critical care time (minutes):  30   Critical care was time spent personally by me on the following activities:  Development of treatment plan with  patient or surrogate, discussions with consultants, evaluation of patient's response to treatment, examination of patient, ordering and review of laboratory studies, ordering and review of radiographic studies, ordering and performing treatments and interventions, pulse oximetry, re-evaluation of patient's condition and review of old charts     Medications Ordered in ED Medications  pantoprozole (PROTONIX) 80 mg /NS 100 mL infusion (8 mg/hr Intravenous New Bag/Given 11/30/22 1638)  acetaminophen (TYLENOL) tablet 1,000 mg (1,000 mg Oral Given 11/30/22 1303)  sodium chloride 0.9 % bolus 1,000 mL (0 mLs Intravenous Stopped 11/30/22 1825)  pantoprazole (PROTONIX) 80 mg /NS 100 mL IVPB (0 mg Intravenous Stopped 11/30/22 1637)  LORazepam (ATIVAN) injection 1 mg (1 mg Intravenous Given 11/30/22 1650)  iohexol (OMNIPAQUE) 350 MG/ML injection 100 mL (100 mLs Intravenous Contrast Given 11/30/22 1823)    ED Course/ Medical Decision Making/ A&P Clinical Course as of 11/30/22 1909  Tue Nov 30, 2022  1656 Fecal Occult Blood, POC(!): POSITIVE  [JL]    Clinical Course User Index [JL] Ernie Avena, MD                                 Medical Decision Making Amount and/or Complexity of Data Reviewed Labs: ordered. Decision-making details documented in ED Course. Radiology: ordered.  Risk Prescription drug management. Decision regarding hospitalization.     77 year old female with medical history significant for diverticular bleeds, diverticulitis, colon polyps, hemorrhoids who presents to the emergency department with bright red blood per rectum for the last 3 days.  She has also had dark black stools.  She endorses right upper quadrant pain and discomfort.  No fevers, nausea or vomiting.  She is felt lightheaded and dizzy.  She denies any chest pain or shortness of breath.  She states that she has previously had a colonoscopy which found polyps.  She does endorse a history of diverticulosis.  She has followed outpatient with [facility] in the past.  On arrival, the patient was afebrile, tachycardic heart rate 106, not tachypneic, initially soft blood pressures 96/45, saturating her percent on room air.  IV access was obtained and the patient was administered IV fluid bolus for volume resuscitation.  Subsequent blood pressures were improved with fluid resuscitation.  Physical exam concerning for fecal occult positive stool noted with hematochezia present.  Concern for GI bleed.  The patient was started on Protonix bolus and infusion.  [facility] was consulted for further recommendations.  CT angiogram was ordered in addition to screening labs.  Labs: Fecal occult positive, lipase negative, CMP generally unremarkable, CBC with a stable hemoglobin at 11.6.  CTA GI Bleed:  IMPRESSION:  1. No active GI bleed.  2. Colonic diverticulosis without acute inflammation.  3. Moderate hiatal hernia.  4.  Aortic Atherosclerosis (ICD10-I70.0).    The patient remained hemodynamically stable while in the emergency department, improved after  initial fluid resuscitation.  Discussed care of the patient with Dr. Chales Abrahams of on-call [facility] gastroenterology.  The patient will be seen in consultation while inpatient.  Hemoglobin is stable and patient remains vitally stable. Dr. Avie Arenas of hospitalist medicine was consulted and accepted the patient in admission.   Final Clinical Impression(s) / ED Diagnoses Final diagnoses:  Gastrointestinal hemorrhage, unspecified gastrointestinal hemorrhage type  Lightheadedness  Hematochezia    Rx / DC Orders ED Discharge Orders     None         Ernie Avena, MD 11/30/22 1909

## 2022-12-01 ENCOUNTER — Observation Stay (HOSPITAL_COMMUNITY): Payer: Medicare Other

## 2022-12-01 DIAGNOSIS — Z981 Arthrodesis status: Secondary | ICD-10-CM | POA: Diagnosis not present

## 2022-12-01 DIAGNOSIS — R1011 Right upper quadrant pain: Secondary | ICD-10-CM | POA: Diagnosis not present

## 2022-12-01 DIAGNOSIS — D696 Thrombocytopenia, unspecified: Secondary | ICD-10-CM | POA: Diagnosis present

## 2022-12-01 DIAGNOSIS — K921 Melena: Secondary | ICD-10-CM | POA: Diagnosis present

## 2022-12-01 DIAGNOSIS — Z0389 Encounter for observation for other suspected diseases and conditions ruled out: Secondary | ICD-10-CM | POA: Diagnosis not present

## 2022-12-01 DIAGNOSIS — E785 Hyperlipidemia, unspecified: Secondary | ICD-10-CM | POA: Diagnosis present

## 2022-12-01 DIAGNOSIS — K5900 Constipation, unspecified: Secondary | ICD-10-CM | POA: Diagnosis present

## 2022-12-01 DIAGNOSIS — K625 Hemorrhage of anus and rectum: Secondary | ICD-10-CM | POA: Diagnosis not present

## 2022-12-01 DIAGNOSIS — Z96653 Presence of artificial knee joint, bilateral: Secondary | ICD-10-CM | POA: Diagnosis present

## 2022-12-01 DIAGNOSIS — F32A Depression, unspecified: Secondary | ICD-10-CM | POA: Diagnosis present

## 2022-12-01 DIAGNOSIS — Z79899 Other long term (current) drug therapy: Secondary | ICD-10-CM | POA: Diagnosis not present

## 2022-12-01 DIAGNOSIS — F411 Generalized anxiety disorder: Secondary | ICD-10-CM | POA: Diagnosis present

## 2022-12-01 DIAGNOSIS — F03A4 Unspecified dementia, mild, with anxiety: Secondary | ICD-10-CM | POA: Diagnosis present

## 2022-12-01 DIAGNOSIS — F03A3 Unspecified dementia, mild, with mood disturbance: Secondary | ICD-10-CM | POA: Diagnosis present

## 2022-12-01 DIAGNOSIS — K5731 Diverticulosis of large intestine without perforation or abscess with bleeding: Secondary | ICD-10-CM | POA: Diagnosis present

## 2022-12-01 DIAGNOSIS — I10 Essential (primary) hypertension: Secondary | ICD-10-CM | POA: Diagnosis present

## 2022-12-01 DIAGNOSIS — R1310 Dysphagia, unspecified: Secondary | ICD-10-CM | POA: Diagnosis present

## 2022-12-01 DIAGNOSIS — Z96611 Presence of right artificial shoulder joint: Secondary | ICD-10-CM | POA: Diagnosis present

## 2022-12-01 DIAGNOSIS — K838 Other specified diseases of biliary tract: Secondary | ICD-10-CM | POA: Diagnosis present

## 2022-12-01 DIAGNOSIS — K5730 Diverticulosis of large intestine without perforation or abscess without bleeding: Secondary | ICD-10-CM | POA: Diagnosis not present

## 2022-12-01 DIAGNOSIS — Z96643 Presence of artificial hip joint, bilateral: Secondary | ICD-10-CM | POA: Diagnosis present

## 2022-12-01 DIAGNOSIS — Z6841 Body Mass Index (BMI) 40.0 and over, adult: Secondary | ICD-10-CM | POA: Diagnosis not present

## 2022-12-01 DIAGNOSIS — K580 Irritable bowel syndrome with diarrhea: Secondary | ICD-10-CM | POA: Diagnosis present

## 2022-12-01 DIAGNOSIS — Z9884 Bariatric surgery status: Secondary | ICD-10-CM | POA: Diagnosis not present

## 2022-12-01 DIAGNOSIS — D62 Acute posthemorrhagic anemia: Secondary | ICD-10-CM | POA: Diagnosis present

## 2022-12-01 DIAGNOSIS — Z96612 Presence of left artificial shoulder joint: Secondary | ICD-10-CM | POA: Diagnosis present

## 2022-12-01 DIAGNOSIS — I480 Paroxysmal atrial fibrillation: Secondary | ICD-10-CM | POA: Diagnosis present

## 2022-12-01 DIAGNOSIS — K760 Fatty (change of) liver, not elsewhere classified: Secondary | ICD-10-CM | POA: Diagnosis present

## 2022-12-01 LAB — BASIC METABOLIC PANEL
Anion gap: 9 (ref 5–15)
BUN: 15 mg/dL (ref 8–23)
CO2: 22 mmol/L (ref 22–32)
Calcium: 8 mg/dL — ABNORMAL LOW (ref 8.9–10.3)
Chloride: 107 mmol/L (ref 98–111)
Creatinine, Ser: 0.86 mg/dL (ref 0.44–1.00)
GFR, Estimated: >60 mL/min (ref >60)
Glucose, Bld: 101 mg/dL — ABNORMAL HIGH (ref 70–99)
Potassium: 4 mmol/L (ref 3.5–5.1)
Sodium: 138 mmol/L (ref 135–145)

## 2022-12-01 LAB — CBC
HCT: 28.1 % — ABNORMAL LOW (ref 36.0–46.0)
Hemoglobin: 8.9 g/dL — ABNORMAL LOW (ref 12.0–15.0)
MCH: 31.1 pg (ref 26.0–34.0)
MCHC: 31.7 g/dL (ref 30.0–36.0)
MCV: 98.3 fL (ref 80.0–100.0)
Platelets: 153 10*3/uL (ref 150–400)
RBC: 2.86 MIL/uL — ABNORMAL LOW (ref 3.87–5.11)
RDW: 12.8 % (ref 11.5–15.5)
WBC: 5.3 10*3/uL (ref 4.0–10.5)
nRBC: 0 % (ref 0.0–0.2)

## 2022-12-01 MED ORDER — PANTOPRAZOLE SODIUM 40 MG IV SOLR
40.0000 mg | Freq: Two times a day (BID) | INTRAVENOUS | Status: DC
  Administered 2022-12-01 – 2022-12-02 (×3): 40 mg via INTRAVENOUS
  Filled 2022-12-01 (×3): qty 10

## 2022-12-01 MED ORDER — ATORVASTATIN CALCIUM 10 MG PO TABS
10.0000 mg | ORAL_TABLET | Freq: Every day | ORAL | Status: DC
  Administered 2022-12-02 – 2022-12-04 (×3): 10 mg via ORAL
  Filled 2022-12-01 (×4): qty 1

## 2022-12-01 NOTE — Progress Notes (Signed)
Phlebotomy called by this RN and reminded of pending labs that need to be drawn. The receiver Freida Busman) states "the team is on [address] now and should be heading your way next".

## 2022-12-01 NOTE — Consult Note (Addendum)
Consultation  Referring Provider:   [facility] Primary Care Physician:  Cleatis Polka., MD Primary Gastroenterologist:  Dr. Adela Lank       Reason for Consultation:  rectal bleeding    DOA: 11/30/2022         Hospital Day: 2         HPI:   [name] is a 77 y.o. female with past medical history significant for atrial fibrillation, chronic abdominal pain altered bowel habits suspected IBS, reflux, suspected diverticular bleed in setting of anticoagulation September 2022, status post cholecystectomy.   10/18/2019 colonoscopy during admission for rectal bleeding suspected diverticular bleed multiple medium mouth diverticula entire colon 3 sessile polyps 2 to 3 mm size internal hemorrhoids Brown stool noted throughout colon 04/2019 EGD sleeve gastrectomy anatomy esophageal stricture dilated  Presents to the ER with 3 days of rectal bleeding with some generalized abdominal pain.  Work up notable for positive FOBT, Hgb 11.8 with baseline appears to be between 11 and 12, albumin 3.4, BUN 22, creatinine 1.02, lipase 36, INR 1, positive FOBT CT angio GI bleed showed no active GI bleeding, colonic diverticulosis without inflammation, moderate hiatal hernia, aortic atherosclerosis.  She states she has had RUQ pain with radiation to her flank, not shoulder blade intermittently for last several weeks.  She has memory issues, husband provides some of the history.  States she has complained of RUQ pain in the morning, tylenol helps, not worse after food. No melena She has had pruritus generalized for 2-3 weeks. No jaundice.  Then late Friday evening, she had rectal bleeding. She states she has alternating diarrhea/constipation, she states Friday evening she had an "explosion" of BRB/dark blood with clots in it.  She had BM last night, has not had another, she feels she is full and may need to have a BM.  She was having some dizziness, weakness, denies CP/SOB.  Denies rectal pain, burning,  or itching.  Has had some BRB on TP if she is constipated.  Denies nausea, vomiting, fever, chills.  She denies GERD but has intermittent dysphagia, mostly with rice and can have issues at night with swallowing saliva. She is on nexium at home.  She has had swelling in her AB and feeling bloated.  She has lost 8 lbs in the last month without trying. Appetite is okay.  She is s/p cholecystectomy.  She is complaining of right arm IV pain.   She denies NSAIDS use, ETOH, drug use, smoking.   Abnormal ED labs: Abnormal Labs Reviewed  COMPREHENSIVE METABOLIC PANEL - Abnormal; Notable for the following components:      Result Value   Glucose, Bld 116 (*)    Creatinine, Ser 1.02 (*)    Total Protein 6.1 (*)    Albumin 3.4 (*)    GFR, Estimated 57 (*)    All other components within normal limits  CBC - Abnormal; Notable for the following components:   RBC 3.73 (*)    Hemoglobin 11.8 (*)    All other components within normal limits  POC OCCULT BLOOD, ED - Abnormal; Notable for the following components:   Fecal Occult Bld POSITIVE (*)    All other components within normal limits    Past Medical History:  Diagnosis Date   Allergy    Anemia    Antral polyp    benign   Anxiety    Arthritis    Asthma    B12 deficiency    Back  pain    Chest pain    Constipation    Depression    Diverticulosis    Gallbladder problem    GERD (gastroesophageal reflux disease) 09/09/2011   History of transfusion    HTN (hypertension) 09/09/2011   Hyperlipemia 09/09/2011   Hypertension    IBS (irritable bowel syndrome)    Internal hemorrhoid    Joint pain    Obesity    Osteoarthritis    Paroxysmal atrial fibrillation (HCC)    Pneumonia    PONV (postoperative nausea and vomiting)    with big surgeries, none with endos etc, with colonoscopy- 5-6 years ago could not swallow    Shortness of breath    Sleep apnea    borderline per patient no cpap    SOB (shortness of breath) on exertion     Swallowing difficulty    Tachycardia    Unstable angina (HCC) 09/09/2011   Vitamin D deficiency    Vocal cord dysfunction 2013   swelling    Surgical History:  She  has a past surgical history that includes Total knee arthroplasty (Bilateral); Cholecystectomy; Abdominal hysterectomy (1988); Tumor excision (1964); Wrist surgery (Left, 1997, 2009 ); Elbow surgery (Left); Foot osteotomy w/ plantar fascia release (Left); Total ankle replacement (Right); Total ankle replacement (Left); Ankle Fusion (Right); thumb replacement (Bilateral); Back surgery [id number]); Nasal sinus surgery (1997); Esophagogastroduodenoscopy ([name], 03/06/2013); BRAVO ph study ([name], 03/06/2013); tfc wrist (Left, 1997); left heart catheterization with coronary angiogram (Bilateral, 09/10/2011); Laparoscopic gastric sleeve resection with hiatal hernia repair (08/20/2014); Upper gi endoscopy (08/20/2014); Cardiac catheterization (2013); transthoracic echocardiogram (09/09/2011); Bilateral anterior total hip arthroplasty (Bilateral, 11/12/2014); Shoulder surgery (Left); Total ankle replacement; Total hip arthroplasty (Bilateral); Joint replacement; Total shoulder arthroplasty (Right, 05/14/2016); Iliotibial band release (Right, 1997); Reverse shoulder arthroplasty (Right, 05/14/2016); Colonoscopy; implantable loop recorder placement (01/23/2020); and Reverse shoulder arthroplasty (Left, 05/16/2020). Family History:  Her family history includes Alcoholism in her father; Arthritis in her mother; Cancer in her father and mother; Diabetes in her mother; Esophageal cancer in her mother; Esophageal cancer (age of onset: 88) in her father; Heart disease in her maternal grandmother; Hypertension in her mother and sister; Other in her sister; Stroke in her paternal grandmother and sister. Social History:   reports that she has never smoked. She has never used smokeless tobacco. She reports that she does not drink alcohol and does not use  drugs.  Prior to Admission medications   Medication Sig Start Date End Date Taking? Authorizing Provider  acetaminophen (TYLENOL) 500 MG tablet Take 1,000 mg by mouth every 6 (six) hours as needed (for pain.).    Yes [provider]  albuterol (PROVENTIL HFA;VENTOLIN HFA) 108 (90 Base) MCG/ACT inhaler Inhale 2 puffs into the lungs every 6 (six) hours as needed for shortness of breath or wheezing.   Yes [provider]  ALPRAZolam (XANAX) 0.25 MG tablet Take 0.25 mg by mouth daily as needed for anxiety.   Yes [provider]  BIOTIN PO Take 1 tablet by mouth daily.   Yes [provider]  Cholecalciferol (DIALYVITE VITAMIN D 5000) 125 MCG (5000 UT) capsule Take 5,000 Units by mouth 2 (two) times daily.   Yes [provider]  cyclobenzaprine (FLEXERIL) 10 MG tablet Take 10 mg by mouth 3 (three) times daily as needed for muscle spasms.   Yes [provider]  dicyclomine (BENTYL) 10 MG capsule TAKE 1 CAPSULE BY MOUTH TWICE DAILY AS NEEDED Patient taking differently: Take 10 mg by mouth 2 (  two) times daily as needed for spasms. 11/08/22  Yes Armbruster, Willaim Rayas, MD  diltiazem (CARDIZEM CD) 240 MG 24 hr capsule TAKE 1 CAPSULE BY MOUTH EVERY DAY Patient taking differently: Take 240 mg by mouth daily. 10/05/22  Yes Runell Gess, MD  esomeprazole (NEXIUM) 40 MG capsule TAKE 1 CAPSULE BY MOUTH TWICE A DAY BEFOR A MEAL Patient taking differently: Take 40 mg by mouth daily at 12 noon. 11/13/22  Yes Armbruster, Willaim Rayas, MD  loratadine (CLARITIN) 10 MG tablet Take 10 mg by mouth daily.   Yes [provider]  losartan (COZAAR) 50 MG tablet Take 50 mg by mouth daily. 09/24/19  Yes [provider]  Melatonin 10 MG TABS Take 10 mg by mouth at bedtime.   Yes [provider]  Multiple Vitamin (MULTIVITAMIN WITH MINERALS) TABS tablet Take 1 tablet by mouth daily. Centrum   Yes [provider]  ondansetron (ZOFRAN-ODT) 8 MG  disintegrating tablet DISSOLVE 1 TABLET IN MOUTH EVERY 8 HRS AS NEEDED NAUSEA Patient taking differently: Take 8 mg by mouth every 8 (eight) hours as needed for nausea. 10/07/22  Yes Armbruster, Willaim Rayas, MD  simvastatin (ZOCOR) 20 MG tablet Take 1 tablet (20 mg total) by mouth daily. 08/25/16  Yes Runell Gess, MD  vitamin B-12 (CYANOCOBALAMIN) 1000 MCG tablet Take 1,000 mcg by mouth daily.   Yes [provider]  colestipol (COLESTID) 1 g tablet Take once daily and titrate as needed Patient not taking: Reported on 11/19/2022 01/26/22   Benancio Deeds, MD  memantine (NAMENDA) 5 MG tablet Take 1 tablet (5 mg total) by mouth at bedtime. Patient not taking: Reported on 11/19/2022 11/12/22   Marcos Eke, PA-C    Current Facility-Administered Medications  Medication Dose Route Frequency Provider Last Rate Last Admin   0.9 %  sodium chloride infusion   Intravenous Continuous Alan Mulder, MD 75 mL/hr at 11/30/22 2133 New Bag at 11/30/22 2133   acetaminophen (TYLENOL) tablet 650 mg  650 mg Oral Q6H PRN Alan Mulder, MD       Or   acetaminophen (TYLENOL) suppository 650 mg  650 mg Rectal Q6H PRN Alan Mulder, MD       ALPRAZolam Prudy Feeler) tablet 0.25 mg  0.25 mg Oral Daily PRN Joneen Roach, Debby, MD   0.25 mg at 11/30/22 2132   busPIRone (BUSPAR) tablet 7.5 mg  7.5 mg Oral BID Alan Mulder, MD   7.5 mg at 11/30/22 2131   diltiazem (CARDIZEM CD) 24 hr capsule 240 mg  240 mg Oral Daily Dorrell, Robert, MD       DULoxetine (CYMBALTA) DR capsule 60 mg  60 mg Oral Daily Dorrell, Robert, MD       losartan (COZAAR) tablet 50 mg  50 mg Oral Daily Dorrell, Robert, MD       ondansetron [facility]) tablet 4 mg  4 mg Oral Q6H PRN Alan Mulder, MD   4 mg at 12/01/22 0347   Or   ondansetron (ZOFRAN) injection 4 mg  4 mg Intravenous Q6H PRN Dorrell, Molly Maduro, MD       oxyCODONE (Oxy IR/ROXICODONE) immediate release tablet 5 mg  5 mg Oral Q4H PRN Dorrell, Robert, MD       pantoprozole  (PROTONIX) 80 mg /NS 100 mL infusion  8 mg/hr Intravenous Continuous Alan Mulder, MD 10 mL/hr at 12/01/22 0106 8 mg/hr at 12/01/22 0106   simvastatin (ZOCOR) tablet 20 mg  20 mg Oral Daily Alan Mulder, MD  Allergies as of 11/30/2022 - Review Complete 11/30/2022  Allergen Reaction Noted   Ativan [lorazepam] Other (See Comments) 10/13/2014   Cefdinir Hives 04/18/2019   Buprenorphine hcl Itching and Rash 06/27/2014    Review of Systems:    Constitutional: No weight loss, fever, chills, weakness or fatigue HEENT: Eyes: No change in vision               Ears, Nose, Throat:  No change in hearing or congestion Skin: No rash or itching Cardiovascular: No chest pain, chest pressure or palpitations   Respiratory: No SOB or cough Gastrointestinal: See HPI and otherwise negative Genitourinary: No dysuria or change in urinary frequency Neurological: No headache, dizziness or syncope Musculoskeletal: No new muscle or joint pain Hematologic: No bleeding or bruising Psychiatric: No history of depression or anxiety     Physical Exam:  Vital signs in last 24 hours: Temp:  [97.3 F (36.3 C)-97.8 F (36.6 C)] 97.3 F (36.3 C) (09/11 0752) Pulse Rate:  [77-106] 93 (09/11 0752) Resp:  [16-24] 18 (09/11 0752) BP: (96-147)/(45-91) 147/68 (09/11 0752) SpO2:  [96 %-100 %] 96 % (09/11 0752) Weight:  [105.2 kg] 105.2 kg (09/10 1220) Last BM Date : 11/30/22 Last BM recorded by nurses in past 5 days Stool Type: Type 7 (Liquid consistency with no solid pieces) (12/01/2022  5:00 AM)  General:   Pleasant, well developed female in no acute distress Head:  Normocephalic and atraumatic. Eyes: sclerae anicteric,conjunctive pink  Heart:  regular rate and rhythm Pulm: Clear anteriorly; no wheezing Abdomen:  Soft, Obese AB, Active bowel sounds. mild- moderate tenderness in the RUQ., neg murphy, lower AB fullness. Without guarding and Without rebound, No organomegaly appreciated. Extremities:  With  edema. Msk:  Symmetrical without gross deformities. Peripheral pulses intact.  Neurologic:  Alert and  oriented x4;  No focal deficits.  Skin:   Dry and intact without significant lesions or rashes. Psychiatric:  Cooperative. Normal mood and affect.  LAB RESULTS: Recent Labs    11/30/22 1224  WBC 7.6  HGB 11.8*  HCT 37.2  PLT 225   BMET Recent Labs    11/30/22 1224  NA 139  K 5.1  CL 105  CO2 25  GLUCOSE 116*  BUN 22  CREATININE 1.02*  CALCIUM 9.2   LFT Recent Labs    11/30/22 1224  PROT 6.1*  ALBUMIN 3.4*  AST 21  ALT 20  ALKPHOS 58  BILITOT 0.3   PT/INR Recent Labs    11/30/22 1620  LABPROT 13.7  INR 1.0    STUDIES: CT ANGIO GI BLEED  Result Date: 11/30/2022 CLINICAL DATA:  Rectal bleeding EXAM: CTA ABDOMEN AND PELVIS WITHOUT AND WITH CONTRAST TECHNIQUE: Multidetector CT imaging of the abdomen and pelvis was performed using the standard protocol during bolus administration of intravenous contrast. Multiplanar reconstructed images and MIPs were obtained and reviewed to evaluate the vascular anatomy. RADIATION DOSE REDUCTION: This exam was performed according to the departmental dose-optimization program which includes automated exposure control, adjustment of the mA and/or kV according to patient size and/or use of iterative reconstruction technique. CONTRAST:  OMNIPAQUE IOHEXOL 350 MG/ML SOLN COMPARISON:  08/07/2020 and previous FINDINGS: VASCULAR Aorta: Moderate partially calcified atheromatous plaque without aneurysm, dissection, for for Celiac: Calcified ostial plaque without significant stenosis, patent distally. SMA: Eccentric short-segment calcified ostial plaque without high-grade stenosis, patent distally with classic branch anatomy. Renals: Single bilaterally, both with calcified ostial plaque resulting in mild stenosis, patent distally. IMA: Patent without evidence of  aneurysm, dissection, vasculitis or significant stenosis. Inflow: Mild scattered  calcified plaque in bilateral common and internal iliac arteries. No aneurysm or stenosis. External iliac arteries normal. Proximal Outflow: Bilateral common femoral and visualized portions of the superficial and profunda femoral arteries are patent without evidence of aneurysm, dissection, vasculitis or significant stenosis. Veins: Patent hepatic veins, portal vein, SMV, splenic vein, bilateral renal veins, iliac venous system and IVC. Review of the MIP images confirms the above findings. NON-VASCULAR Lower chest: No pleural or pericardial effusion. Visualized lung bases clear. Hepatobiliary: No focal liver abnormality is seen. Status post cholecystectomy. Ectatic central intrahepatic biliary tree and CBD, stable. Pancreas: Unremarkable. No pancreatic ductal dilatation or surrounding inflammatory changes. Spleen: Normal in size without focal abnormality. Adrenals/Urinary Tract: Chronic linear right adrenal calcifications without mass. No urolithiasis. No hydronephrosis. Stable 1.5 cm left upper pole cyst; no follow-up recommended. Urinary bladder physiologically distended. Stomach/Bowel: No active GI bleed. Moderate hiatal hernia. Post gastric bypass surgery. Small bowel decompressed. Appendix not identified. The colon is partially distended with multiple scattered diverticula throughout, most numerous in the distal descending and sigmoid segments, without adjacent inflammatory change. Lymphatic: No abdominal or pelvic adenopathy. Reproductive: Status post hysterectomy. No adnexal masses. Other: No ascites.  No free air. Musculoskeletal: Mild spondylitic changes throughout the visualized lower thoracic and lumbar spine. Bilateral hip arthroplasty components project in expected location. No fracture or worrisome bone lesion. IMPRESSION: 1. No active GI bleed. 2. Colonic diverticulosis without acute inflammation. 3. Moderate hiatal hernia. 4.  Aortic Atherosclerosis (ICD10-I70.0). Electronically Signed   By: Corlis Leak M.D.   On: 11/30/2022 19:06      Impression    Hematochezia without hemodynamic compromise HGB 11.8-->8.9  11/30/2022 BUN 22 Cr 1.02- no elevation of BUN  Patient has had diverticular bleed in the past.  With clinical presentation of large painless hematochezia, most likely this is a diverticular bleed.  Last colonoscopy 2021   RUQ pain s/p cholecystectomy Ectatic central intrahepatic biliary tree and CBD, stable. S/p gastric bypass surgery, moderate HH Remote history of elevated AST/ALT 2021, normal since that time.  No rash, better with tyelnol  GERD EGD 04/2019 sleeve gastrectomy + stricture s/p dilation Currently on protonix infusion, on nexium at home Some dysphagia intermittent, no GERD  Afib Not on anticoagulation NSR at this time  Morbid obesity BMI 42   Principal Problem:   Rectal bleeding    LOS: 0 days     Plan   - with history this is most likely diverticular bleed, CTA negative, last colon was 2021 - If patient has large volume rectal bleeding again, please get CTA and consult IR if positive.  --Continue to monitor H&H with transfusion as needed to maintain hemoglobin greater than 7. - stop PPI infusion, can do protonix IV BID -Check iron/ferritin/B12 -Check RUQ [place], consider AMA/ASMA with pruritus -Lidocaine patch for possible muscular component for RUQ pain -No plan for enodsocpic evaluation at this time, if patient continues to bleed, can consider EGD/Colon this visit   Thank you for your kind consultation, we will continue to follow.   Doree Albee  12/01/2022, 8:29 AM   Attending physician's note  I have taken a history, reviewed the chart and examined the patient. I performed a substantive portion of this encounter, including complete performance of at least one of the key components, in conjunction with the APP. I agree with the APP's note, impression and recommendations.    77 year old female with history of chronic abdominal  pain,  IBS, A-fib on chronic anticoagulation admitted with hematochezia, likely etiology acute diverticular hemorrhage  Monitor hemoglobin and transfuse if below 7 Supportive care Nuclear med RBC tagged scan if has ongoing episodes of recurrent lower GI bleed or CT angio if has a large volume bleed  Right upper quadrant pain, ordered abdominal ultrasound She is s/p cholecystectomy and sleeve gastrectomy Clear liquid diet PPI twice daily  GI will continue to follow along  The patient was provided an opportunity to ask questions and all were answered. The patient agreed with the plan and demonstrated an understanding of the instructions.  Iona Beard , MD [phone]

## 2022-12-01 NOTE — Plan of Care (Signed)

## 2022-12-01 NOTE — Progress Notes (Signed)
HOSPITALIST ROUNDING NOTE [name] MWN:[id number]  DOB: 03/16/1946  DOA: 11/30/2022  PCP: Cleatis Polka., MD 12/01/2022,7:04 AM   LOS: 0 days      Code Status: full    From: Home  current Dispo: Likely home     8 white 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 Adela Lank back in 2021 Also atrial fibrillation CHADVASC >4 previously on Eliquis HTN Morbid obesity BMI 42  Admit with RUQ pain radiating to flank only taking Tylenol and Alka-Seltzer-9/6 developed rectal bleeding explosion bright red blood and dark blood Hemoglobin dropped from 12 to about 9 with no elevation in BUN 9/11 GI consulted  Plan  Hematochezia without shocklike physiology--prior gastric sleeve 2016 [done because needed to the lower BMI for bilateral hip surgery under Dr. Constance Goltz Diverticular likely as no upper abdominal symptoms other than below-no vomiting Symptomatic management, continue Protonix 40 IV twice daily, only clear liquid or as per GI If large amount of bleeding-next step CT angio stat  RUQ pain Mild dilatation of CBD but this is chronic-no further workup  Hepatic steatosis Outpatient eval, FibroSure testing etc. etc. given patient habitus  Atrial fibrillation CHADVASC >4-not on anticoagulation Continue Cardizem 240 daily given intact physiology  HTN continue losartan 50  Mild dementia Continue Namenda 5 at bedtime   DVT prophylaxis: SCD at this time  Status is: Observation The patient will require care spanning > 2 midnights and should be moved to inpatient because:   Requires hemodynamic stability and clear plan from GI prior to discharge    Subjective:  Coherent gets a little confused at times Husband at bedside supplementing history Overall looks well No distress overall 1 large stool today which was dark was sitting on the commode but none further  Objective  + exam Vitals:   11/30/22 1619 11/30/22 1630 11/30/22 2046 12/01/22 0443  BP: 129/63 (!) 133/54 (!) 136/91 124/82  Pulse: 77 78 91 98  Resp: (!) 24 19 18 18   Temp:   (!) 97.5 F (36.4 C) 97.8 F (36.6 C)  TempSrc:   Oral Oral  SpO2: 98% 100%  98%  Weight:      Height:       Filed Weights   11/30/22 1220  Weight: 105.2 kg    Examination:  EOMI NCAT no focal deficit Abdomen obese nontender slight right upper quadrant tenderness S1-S2 no murmur Chest clear ROM intact moving 4 limbs equally trace lower extremity edema Thick neck Mallampati 4  Data Reviewed: reviewed   CBC    Component Value Date/Time   WBC 7.6 11/30/2022 1224   RBC 3.73 (L) 11/30/2022 1224   HGB 11.8 (L) 11/30/2022 1224   HGB 13.0 02/15/2017 1207   HCT 37.2 11/30/2022 1224   HCT 40.0 02/15/2017 1207   PLT 225 11/30/2022 1224   MCV 99.7 11/30/2022 1224   MCV 94 02/15/2017 1207   MCH 31.6 11/30/2022 1224   MCHC 31.7 11/30/2022 1224   RDW 12.6 11/30/2022 1224   RDW 13.3 02/15/2017 1207   LYMPHSABS 2.4 10/02/2019 1243   LYMPHSABS 1.4 02/15/2017 1207   MONOABS 0.5 10/02/2019 1243   EOSABS 0.0 10/02/2019 1243   EOSABS 0.1 02/15/2017 1207   BASOSABS 0.0 10/02/2019 1243   BASOSABS 0.0 02/15/2017 1207      Latest Ref Rng & Units 11/30/2022   12:24 PM 05/17/2020    2:44 AM 05/08/2020    2:26 PM  CMP  Glucose 70 - 99 mg/dL 161  096  98   BUN 8 - 23 mg/dL 22  21  18    Creatinine 0.44 - 1.00 mg/dL 0.45  4.09  8.11   Sodium 135 - 145 mmol/L 139  139  140   Potassium 3.5 - 5.1 mmol/L 5.1  4.6  4.4   Chloride 98 - 111 mmol/L 105  107  105   CO2 22 - 32 mmol/L 25  23  25    Calcium 8.9 - 10.3 mg/dL 9.2  8.5  9.3   Total Protein 6.5 - 8.1 g/dL 6.1     Total Bilirubin 0.3 - 1.2 mg/dL 0.3     Alkaline Phos 38 - 126 U/L 58     AST 15 - 41 U/L 21     ALT 0 - 44 U/L 20        Scheduled Meds:  busPIRone  7.5 mg Oral BID   diltiazem  240 mg Oral Daily   DULoxetine  60 mg Oral Daily   losartan  50 mg  Oral Daily   simvastatin  20 mg Oral Daily   Continuous Infusions:  sodium chloride 75 mL/hr at 11/30/22 2133   pantoprazole 8 mg/hr (12/01/22 0106)    Time  46  Rhetta Mura, MD  [facility]

## 2022-12-02 DIAGNOSIS — K5731 Diverticulosis of large intestine without perforation or abscess with bleeding: Secondary | ICD-10-CM | POA: Diagnosis not present

## 2022-12-02 DIAGNOSIS — K921 Melena: Secondary | ICD-10-CM

## 2022-12-02 DIAGNOSIS — K625 Hemorrhage of anus and rectum: Secondary | ICD-10-CM | POA: Diagnosis not present

## 2022-12-02 LAB — COMPREHENSIVE METABOLIC PANEL
ALT: 18 U/L (ref 0–44)
AST: 20 U/L (ref 15–41)
Albumin: 2.7 g/dL — ABNORMAL LOW (ref 3.5–5.0)
Alkaline Phosphatase: 46 U/L (ref 38–126)
Anion gap: 6 (ref 5–15)
BUN: 8 mg/dL (ref 8–23)
CO2: 24 mmol/L (ref 22–32)
Calcium: 8 mg/dL — ABNORMAL LOW (ref 8.9–10.3)
Chloride: 110 mmol/L (ref 98–111)
Creatinine, Ser: 0.81 mg/dL (ref 0.44–1.00)
GFR, Estimated: >60 mL/min (ref >60)
Glucose, Bld: 102 mg/dL — ABNORMAL HIGH (ref 70–99)
Potassium: 3.6 mmol/L (ref 3.5–5.1)
Sodium: 140 mmol/L (ref 135–145)
Total Bilirubin: 0.3 mg/dL (ref 0.3–1.2)
Total Protein: 4.7 g/dL — ABNORMAL LOW (ref 6.5–8.1)

## 2022-12-02 LAB — CBC
HCT: 26.7 % — ABNORMAL LOW (ref 36.0–46.0)
Hemoglobin: 8.3 g/dL — ABNORMAL LOW (ref 12.0–15.0)
MCH: 30.4 pg (ref 26.0–34.0)
MCHC: 31.1 g/dL (ref 30.0–36.0)
MCV: 97.8 fL (ref 80.0–100.0)
Platelets: 167 10*3/uL (ref 150–400)
RBC: 2.73 MIL/uL — ABNORMAL LOW (ref 3.87–5.11)
RDW: 12.8 % (ref 11.5–15.5)
WBC: 4.5 10*3/uL (ref 4.0–10.5)
nRBC: 0 % (ref 0.0–0.2)

## 2022-12-02 MED ORDER — LORATADINE 10 MG PO TABS
10.0000 mg | ORAL_TABLET | Freq: Every day | ORAL | Status: DC
  Administered 2022-12-02 – 2022-12-04 (×3): 10 mg via ORAL
  Filled 2022-12-02 (×4): qty 1

## 2022-12-02 MED ORDER — PANTOPRAZOLE SODIUM 40 MG PO TBEC
40.0000 mg | DELAYED_RELEASE_TABLET | Freq: Two times a day (BID) | ORAL | Status: DC
  Administered 2022-12-02 – 2022-12-04 (×5): 40 mg via ORAL
  Filled 2022-12-02 (×6): qty 1

## 2022-12-02 NOTE — Progress Notes (Addendum)
HOSPITALIST ROUNDING NOTE ZONG MOGEL ZDG:[id number]  DOB: 02-May-1945  DOA: 11/30/2022  PCP: Cleatis Polka., MD 12/02/2022,3:47 PM   LOS: 1 day      Code Status: full    From: Home  current Dispo: Likely home     50 white 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 Adela Lank back in 2021 Also atrial fibrillation CHADVASC >4 previously on Eliquis but this was discontinued in 2021 HTN Morbid obesity BMI 42  Admit with RUQ pain radiating to flank only taking Tylenol and Alka-Seltzer-9/6 developed rectal bleeding explosion bright red blood and dark blood Hemoglobin dropped from 12 to about 9 with no elevation in BUN 9/11 GI consulted  Plan  Hematochezia without shocklike physiology--prior gastric sleeve 2016 [done because needed to the lower BMI for bilateral hip surgery under Dr. Constance Goltz Diverticular likely --diet, meds per GI  If large amount of bleeding-next step CT angio stat  RUQ pain Mild dilatation of CBD but this is chronic-no further workup  Hepatic steatosis Outpatient eval, FibroSure testing etc. etc. given patient habitus  Atrial fibrillation CHADVASC >4-not on anticoagulation Continue Cardizem 240 daily has been off Eliquis since hospitalization in 2021 Will need to follow-up with A-fib clinic (last seen 08/2022) versus general cardiology and will send a message to them  HTN continue losartan 50  Mild dementia Continue Namenda 5 at bedtime   DVT prophylaxis: SCD at this time  Status is: Observation The patient will require care spanning > 2 midnights and should be moved to inpatient because:   Requires clearance from GI prior to discharge    Subjective:  Happy no new dark stool tarry stool just some blood on wiping No chest pain Had chicken noodle soup also has been cleared for regular diet-I encouraged her to challenge her gut properly with 2  more meals and probably we can talk about discharge soon Husband is at the bedside  Objective + exam Vitals:   12/01/22 1642 12/01/22 1947 12/02/22 0507 12/02/22 0743  BP: 120/60 (!) 108/59 126/79 134/73  Pulse: 86 91 79 85  Resp: 18 17 18 17   Temp: 97.8 F (36.6 C)  97.7 F (36.5 C) 97.7 F (36.5 C)  TempSrc: Oral   Oral  SpO2: 98% 100% 97% 100%  Weight:      Height:       Filed Weights   11/30/22 1220  Weight: 105.2 kg    Examination:  Jovial pleasant white lady, Mallampati 4 S1-S2 no murmur Abdomen soft with slight tenderness in RUQ Trace lower extremity edema Chest clear  Data Reviewed: reviewed   CBC    Component Value Date/Time   WBC 4.5 12/02/2022 0547   RBC 2.73 (L) 12/02/2022 0547   HGB 8.3 (L) 12/02/2022 0547   HGB 13.0 02/15/2017 1207   HCT 26.7 (L) 12/02/2022 0547   HCT 40.0 02/15/2017 1207   PLT 167 12/02/2022 0547   MCV 97.8 12/02/2022 0547   MCV 94 02/15/2017 1207   MCH 30.4 12/02/2022 0547   MCHC 31.1 12/02/2022 0547   RDW 12.8 12/02/2022 0547   RDW 13.3 02/15/2017 1207   LYMPHSABS 2.4 10/02/2019 1243   LYMPHSABS 1.4 02/15/2017 1207   MONOABS 0.5 10/02/2019 1243   EOSABS 0.0 10/02/2019 1243   EOSABS 0.1 02/15/2017 1207   BASOSABS 0.0 10/02/2019 1243   BASOSABS 0.0 02/15/2017 1207      Latest Ref Rng & Units 12/02/2022  5:47 AM 12/01/2022    8:43 AM 11/30/2022   12:24 PM  CMP  Glucose 70 - 99 mg/dL 324  401  027   BUN 8 - 23 mg/dL 8  15  22    Creatinine 0.44 - 1.00 mg/dL 2.53  6.64  4.03   Sodium 135 - 145 mmol/L 140  138  139   Potassium 3.5 - 5.1 mmol/L 3.6  4.0  5.1   Chloride 98 - 111 mmol/L 110  107  105   CO2 22 - 32 mmol/L 24  22  25    Calcium 8.9 - 10.3 mg/dL 8.0  8.0  9.2   Total Protein 6.5 - 8.1 g/dL 4.7   6.1   Total Bilirubin 0.3 - 1.2 mg/dL 0.3   0.3   Alkaline Phos 38 - 126 U/L 46   58   AST 15 - 41 U/L 20   21   ALT 0 - 44 U/L 18   20      Scheduled Meds:  atorvastatin  10 mg Oral Daily   busPIRone  7.5 mg  Oral BID   diltiazem  240 mg Oral Daily   DULoxetine  60 mg Oral Daily   loratadine  10 mg Oral Daily   losartan  50 mg Oral Daily   pantoprazole  40 mg Oral BID AC   Continuous Infusions:  sodium chloride 75 mL/hr at 12/02/22 0252    Time  46  Rhetta Mura, MD  [facility]

## 2022-12-02 NOTE — Progress Notes (Addendum)
Progress Note  Primary GI: Dr. Adela Lank DOA: 11/30/2022         Hospital Day: 3   Subjective  Chief Complaint:  rectal bleeding   Patient with family at bedside, husband. Provided some of the history.  Patient has poor memory.  She had red liquid stool documented at 1130 last night, had two Bm's this AM, one at 730 liquid brown, and one more formed, small volume brown stool at 930.  She had some RUQ pain, tylenol helped, no nausea, vomiting.      Objective   Vital signs in last 24 hours: Temp:  [97.7 F (36.5 C)-97.8 F (36.6 C)] 97.7 F (36.5 C) (09/12 0743) Pulse Rate:  [79-91] 85 (09/12 0743) Resp:  [17-18] 17 (09/12 0743) BP: (108-134)/(59-79) 134/73 (09/12 0743) SpO2:  [97 %-100 %] 100 % (09/12 0743) Last BM Date : 12/01/22 Last BM recorded by nurses in past 5 days Stool Type: Type 2 (Lump and sausage like) (12/02/2022  9:48 AM)  General:   female in no acute distress  Heart:  Regular rate and rhythm; no murmurs Pulm: Clear anteriorly; no wheezing Abdomen:  Soft, Obese AB, Active bowel sounds. mild tenderness in the RUQ and in the right flank. Without guarding and Without rebound, No organomegaly appreciated. Extremities:  without  edema. Neurologic:  Alert and  oriented x4;  No focal deficits.  Psych:  Cooperative. Normal mood and affect.  Intake/Output from previous day: 09/11 0701 - 09/12 0700 In: 200 [P.O.:200] Out: 1500 [Urine:1500] Intake/Output this shift: Total I/O In: -  Out: 400 [Urine:400]  Studies/Results: US ABDOMEN LIMITED RUQ (LIVER/GB)  Result Date: 12/01/2022 CLINICAL DATA:  Right upper quadrant pain EXAM: ULTRASOUND ABDOMEN LIMITED RIGHT UPPER QUADRANT COMPARISON:  GI bleed CTA dated November 30, 2022; CT abdomen and pelvis dated July 25th 2021 FINDINGS: Gallbladder: Prior cholecystectomy. Common bile duct: Diameter: 14 mm Liver: Left lobe of the liver is difficult to visualize. Within limitations, no focal lesion identified. Increased  parenchymal echogenicity. Portal vein is patent on color Doppler imaging with normal direction of blood flow towards the liver. Other: None. IMPRESSION: 1. Dilated common bile duct, unchanged when compared with the prior CTs and likely due to post cholecystectomy state. 2. Hepatic steatosis. Electronically Signed   By: Allegra Lai M.D.   On: 12/01/2022 13:33   CT ANGIO GI BLEED  Result Date: 11/30/2022 CLINICAL DATA:  Rectal bleeding EXAM: CTA ABDOMEN AND PELVIS WITHOUT AND WITH CONTRAST TECHNIQUE: Multidetector CT imaging of the abdomen and pelvis was performed using the standard protocol during bolus administration of intravenous contrast. Multiplanar reconstructed images and MIPs were obtained and reviewed to evaluate the vascular anatomy. RADIATION DOSE REDUCTION: This exam was performed according to the departmental dose-optimization program which includes automated exposure control, adjustment of the mA and/or kV according to patient size and/or use of iterative reconstruction technique. CONTRAST:  OMNIPAQUE IOHEXOL 350 MG/ML SOLN COMPARISON:  08/07/2020 and previous FINDINGS: VASCULAR Aorta: Moderate partially calcified atheromatous plaque without aneurysm, dissection, for for Celiac: Calcified ostial plaque without significant stenosis, patent distally. SMA: Eccentric short-segment calcified ostial plaque without high-grade stenosis, patent distally with classic branch anatomy. Renals: Single bilaterally, both with calcified ostial plaque resulting in mild stenosis, patent distally. IMA: Patent without evidence of aneurysm, dissection, vasculitis or significant stenosis. Inflow: Mild scattered calcified plaque in bilateral common and internal iliac arteries. No aneurysm or stenosis. External iliac arteries normal. Proximal Outflow: Bilateral common femoral and visualized portions of the superficial  and profunda femoral arteries are patent without evidence of aneurysm, dissection, vasculitis or  significant stenosis. Veins: Patent hepatic veins, portal vein, SMV, splenic vein, bilateral renal veins, iliac venous system and IVC. Review of the MIP images confirms the above findings. NON-VASCULAR Lower chest: No pleural or pericardial effusion. Visualized lung bases clear. Hepatobiliary: No focal liver abnormality is seen. Status post cholecystectomy. Ectatic central intrahepatic biliary tree and CBD, stable. Pancreas: Unremarkable. No pancreatic ductal dilatation or surrounding inflammatory changes. Spleen: Normal in size without focal abnormality. Adrenals/Urinary Tract: Chronic linear right adrenal calcifications without mass. No urolithiasis. No hydronephrosis. Stable 1.5 cm left upper pole cyst; no follow-up recommended. Urinary bladder physiologically distended. Stomach/Bowel: No active GI bleed. Moderate hiatal hernia. Post gastric bypass surgery. Small bowel decompressed. Appendix not identified. The colon is partially distended with multiple scattered diverticula throughout, most numerous in the distal descending and sigmoid segments, without adjacent inflammatory change. Lymphatic: No abdominal or pelvic adenopathy. Reproductive: Status post hysterectomy. No adnexal masses. Other: No ascites.  No free air. Musculoskeletal: Mild spondylitic changes throughout the visualized lower thoracic and lumbar spine. Bilateral hip arthroplasty components project in expected location. No fracture or worrisome bone lesion. IMPRESSION: 1. No active GI bleed. 2. Colonic diverticulosis without acute inflammation. 3. Moderate hiatal hernia. 4.  Aortic Atherosclerosis (ICD10-I70.0). Electronically Signed   By: Corlis Leak M.D.   On: 11/30/2022 19:06    Lab Results: Recent Labs    11/30/22 1224 12/01/22 0843 12/02/22 0547  WBC 7.6 5.3 4.5  HGB 11.8* 8.9* 8.3*  HCT 37.2 28.1* 26.7*  PLT 225 153 167   BMET Recent Labs    11/30/22 1224 12/01/22 0843 12/02/22 0547  NA 139 138 140  K 5.1 4.0 3.6  CL 105  107 110  CO2 25 22 24   GLUCOSE 116* 101* 102*  BUN 22 15 8   CREATININE 1.02* 0.86 0.81  CALCIUM 9.2 8.0* 8.0*   LFT Recent Labs    12/02/22 0547  PROT 4.7*  ALBUMIN 2.7*  AST 20  ALT 18  ALKPHOS 46  BILITOT 0.3   PT/INR Recent Labs    11/30/22 1620  LABPROT 13.7  INR 1.0     Scheduled Meds:  atorvastatin  10 mg Oral Daily   busPIRone  7.5 mg Oral BID   diltiazem  240 mg Oral Daily   DULoxetine  60 mg Oral Daily   losartan  50 mg Oral Daily   pantoprazole (PROTONIX) IV  40 mg Intravenous Q12H   Continuous Infusions:  sodium chloride 75 mL/hr at 12/02/22 0252     Impression/Plan:   Hematochezia without hemodynamic compromise. HGB 11.8-->8.9 -- 8.3 Patient has had diverticular bleed in the past.  With clinical presentation of large painless hematochezia, most likely this is a diverticular bleed.  Last colonoscopy 2021  Nuclear med tagged scan if ongoing episodes of recurrent lower GI bleeding for CT angio for large volume. Appears to be slowing down, HGB stable, last BM with brown stool, patient is hungry, will start soft diet and see how patient does.  Continue follow up today, repeat CBC in the morning, if no further bleeding and CBC is stable, can likely go home tomorrow.   RUQ pain s/p cholecystectomy Ectatic central intrahepatic biliary tree and CBD, stable. S/p gastric bypass surgery, moderate HH Right upper quadrant abdominal ultrasound unremarkable Liver function normal From history and physical believe this is potentially more musculoskeletal can try lidocaine patches/continue Tylenol   GERD EGD 04/2019 sleeve gastrectomy +  stricture s/p dilation Currently on protonix infusion, on nexium at home Some dysphagia intermittent, no GERD Stop IV PPI, switch to PO protonix 40 mg BID  Afib Not on anticoagulation NSR at this time   Morbid obesity BMI 42  Principal Problem:   Rectal bleeding    LOS: 1 day   [name]  12/02/2022, 11:47  AM   Attending physician's note   I have taken a history, reviewed the chart and examined the patient. I performed a substantive portion of this encounter, including complete performance of at least one of the key components, in conjunction with the APP. I agree with the APP's note, impression and recommendations.    No further bleeding Hgb 8.3 Advance diet as tolerated CTA negative, likely etiology acute diverticular hemorrhage  Ok to discharge patient home tomorrow if no further bleeding  The patient was provided an opportunity to ask questions and all were answered. The patient agreed with the plan and demonstrated an understanding of the instructions.   Iona Beard , MD [phone]

## 2022-12-03 ENCOUNTER — Inpatient Hospital Stay (HOSPITAL_COMMUNITY): Payer: Medicare Other

## 2022-12-03 DIAGNOSIS — K5731 Diverticulosis of large intestine without perforation or abscess with bleeding: Secondary | ICD-10-CM | POA: Diagnosis not present

## 2022-12-03 DIAGNOSIS — K625 Hemorrhage of anus and rectum: Secondary | ICD-10-CM | POA: Diagnosis not present

## 2022-12-03 LAB — BASIC METABOLIC PANEL
Anion gap: 12 (ref 5–15)
BUN: 10 mg/dL (ref 8–23)
CO2: 20 mmol/L — ABNORMAL LOW (ref 22–32)
Calcium: 8.5 mg/dL — ABNORMAL LOW (ref 8.9–10.3)
Chloride: 106 mmol/L (ref 98–111)
Creatinine, Ser: 0.92 mg/dL (ref 0.44–1.00)
GFR, Estimated: >60 mL/min (ref >60)
Glucose, Bld: 95 mg/dL (ref 70–99)
Potassium: 3.2 mmol/L — ABNORMAL LOW (ref 3.5–5.1)
Sodium: 138 mmol/L (ref 135–145)

## 2022-12-03 LAB — CBC
HCT: 26.6 % — ABNORMAL LOW (ref 36.0–46.0)
Hemoglobin: 8.3 g/dL — ABNORMAL LOW (ref 12.0–15.0)
MCH: 31.4 pg (ref 26.0–34.0)
MCHC: 31.2 g/dL (ref 30.0–36.0)
MCV: 100.8 fL — ABNORMAL HIGH (ref 80.0–100.0)
Platelets: 156 10*3/uL (ref 150–400)
RBC: 2.64 MIL/uL — ABNORMAL LOW (ref 3.87–5.11)
RDW: 12.7 % (ref 11.5–15.5)
WBC: 4.7 10*3/uL (ref 4.0–10.5)
nRBC: 0 % (ref 0.0–0.2)

## 2022-12-03 LAB — HEMOGLOBIN AND HEMATOCRIT, BLOOD
HCT: 28.6 % — ABNORMAL LOW (ref 36.0–46.0)
Hemoglobin: 8.9 g/dL — ABNORMAL LOW (ref 12.0–15.0)

## 2022-12-03 MED ORDER — MELATONIN 3 MG PO TABS
3.0000 mg | ORAL_TABLET | Freq: Every evening | ORAL | Status: DC | PRN
  Administered 2022-12-03 – 2022-12-04 (×2): 3 mg via ORAL
  Filled 2022-12-03 (×2): qty 1

## 2022-12-03 MED ORDER — TECHNETIUM TC 99M-LABELED RED BLOOD CELLS IV KIT
25.0000 | PACK | Freq: Once | INTRAVENOUS | Status: AC | PRN
  Administered 2022-12-03: 25 via INTRAVENOUS

## 2022-12-03 NOTE — Progress Notes (Signed)
HOSPITALIST ROUNDING NOTE [name] [id number]:[id number]  DOB: [birth year]  DOA: 11/30/2022  PCP: [name]., MD 12/03/2022,5:14 PM   LOS: 2 days      Code Status: full    From: Home  current Dispo: Likely home     45 white 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 Adela Green back in 2021 Also atrial fibrillation CHADVASC >4 previously on Eliquis but this was discontinued in 2021 HTN Morbid obesity BMI 42  Admit with RUQ pain radiating to flank only taking Tylenol and Alka-Seltzer-9/6 developed rectal bleeding explosion bright red blood and dark blood Hemoglobin dropped from 12 to about 9 with no elevation in BUN 9/11 GI consulted  Plan  Hematochezia without shocklike physiology--prior gastric sleeve 2016 [done because needed to the lower BMI for bilateral hip surgery under Dr. Constance Green Diverticular likely --3 BM today before I had after breakfast and then once again midmorning Getting tagged RBC scan?  EGD  RUQ pain Mild dilatation of CBD but this is chronic-no further workup  Hepatic steatosis Outpatient eval, FibroSure testing etc. etc. given patient habitus  Atrial fibrillation CHADVASC >4-not on anticoagulation Continue Cardizem 240 daily has been off Eliquis since hospitalization in 2021 Will need to follow-up with A-fib clinic (last seen 08/2022) versus general cardiology and will send a message to them  HTN continue losartan 50  Mild dementia Continue Namenda 5 at bedtime   DVT prophylaxis: SCD at this time  Status is: Observation The patient will require care spanning > 2 midnights and should be moved to inpatient because:   Requires clearance from GI prior to discharge    Subjective:  Patient off unit several times today-I discussed plan with husband  Objective + exam Vitals:   12/03/22 0624 12/03/22 0730 12/03/22 1205 12/03/22 1206  BP:  136/68 (!) 149/73 (!) 142/64 (!) 142/64  Pulse: 80 80 87   Resp: 18 17    Temp: 97.6 F (36.4 C) 98.1 F (36.7 C) 97.9 F (36.6 C)   TempSrc: Oral Oral Oral   SpO2: 98% 99% 100%   Weight:      Height:       Filed Weights   11/30/22 1220  Weight: 105.2 kg    Examination:  Patient off unit  Data Reviewed: reviewed   CBC    Component Value Date/Time   WBC 4.7 12/03/2022 0609   RBC 2.64 (L) 12/03/2022 0609   HGB 8.3 (L) 12/03/2022 0609   HGB 13.0 02/15/2017 1207   HCT 26.6 (L) 12/03/2022 0609   HCT 40.0 02/15/2017 1207   PLT 156 12/03/2022 0609   MCV 100.8 (H) 12/03/2022 0609   MCV 94 02/15/2017 1207   MCH 31.4 12/03/2022 0609   MCHC 31.2 12/03/2022 0609   RDW 12.7 12/03/2022 0609   RDW 13.3 02/15/2017 1207   LYMPHSABS 2.4 10/02/2019 1243   LYMPHSABS 1.4 02/15/2017 1207   MONOABS 0.5 10/02/2019 1243   EOSABS 0.0 10/02/2019 1243   EOSABS 0.1 02/15/2017 1207   BASOSABS 0.0 10/02/2019 1243   BASOSABS 0.0 02/15/2017 1207      Latest Ref Rng & Units 12/02/2022    5:47 AM 12/01/2022    8:43 AM 11/30/2022   12:24 PM  CMP  Glucose 70 - 99 mg/dL 841  324  401   BUN 8 - 23 mg/dL 8  15  22    Creatinine 0.44 - 1.00 mg/dL 0.27  2.53  1.02   Sodium 135 - 145 mmol/L 140  138  139   Potassium 3.5 - 5.1 mmol/L 3.6  4.0  5.1   Chloride 98 - 111 mmol/L 110  107  105   CO2 22 - 32 mmol/L 24  22  25    Calcium 8.9 - 10.3 mg/dL 8.0  8.0  9.2   Total Protein 6.5 - 8.1 g/dL 4.7   6.1   Total Bilirubin 0.3 - 1.2 mg/dL 0.3   0.3   Alkaline Phos 38 - 126 U/L 46   58   AST 15 - 41 U/L 20   21   ALT 0 - 44 U/L 18   20      Scheduled Meds:  atorvastatin  10 mg Oral Daily   busPIRone  7.5 mg Oral BID   diltiazem  240 mg Oral Daily   DULoxetine  60 mg Oral Daily   loratadine  10 mg Oral Daily   losartan  50 mg Oral Daily   pantoprazole  40 mg Oral BID AC   Continuous Infusions:  sodium chloride 10 mL/hr at 12/02/22 1805    Time  46  Rhetta Mura, MD  [facility]

## 2022-12-03 NOTE — Progress Notes (Addendum)
Progress Note  Primary GI: Dr. Adela Lank DOA: 11/30/2022         Hospital Day: 4   Subjective  Chief Complaint:  rectal bleeding   Patient with family at bedside, husband. Provided some of the history.  Patient has poor memory.  Per patient and husband after breakfast, had feeling of need to have BM,, had small volume stool with BRB, then had 2-3 more episodes of dark black tarry stools with BRB in the toilet. Nurse confirms stools.  Still with RUQ AB pain, worse with sitting up. Some nausea, no vomiting.  No chest pain, no SOB.     Objective   Vital signs in last 24 hours: Temp:  [97.4 F (36.3 C)-98.1 F (36.7 C)] 98.1 F (36.7 C) (09/13 0730) Pulse Rate:  [72-80] 80 (09/13 0730) Resp:  [17-18] 17 (09/13 0730) BP: (125-149)/(65-73) 149/73 (09/13 0730) SpO2:  [98 %-99 %] 99 % (09/13 0730) Last BM Date : 12/01/22 Last BM recorded by nurses in past 5 days Stool Type: Type 2 (Lump and sausage like) (12/02/2022  9:48 AM)  General:   female in no acute distress  Heart:  Regular rate and rhythm; no murmurs Pulm: Clear anteriorly; no wheezing Abdomen:  Soft, Obese AB, Active bowel sounds. mild tenderness in the RUQ and in the right flank. Without guarding and Without rebound, No organomegaly appreciated. Extremities:  without  edema. Neurologic:  Alert and  oriented x4;  No focal deficits.  Psych:  Cooperative. Normal mood and affect.  Intake/Output from previous day: 09/12 0701 - 09/13 0700 In: -  Out: 400 [Urine:400] Intake/Output this shift: No intake/output data recorded.  Studies/Results: US ABDOMEN LIMITED RUQ (LIVER/GB)  Result Date: 12/01/2022 CLINICAL DATA:  Right upper quadrant pain EXAM: ULTRASOUND ABDOMEN LIMITED RIGHT UPPER QUADRANT COMPARISON:  GI bleed CTA dated November 30, 2022; CT abdomen and pelvis dated July 25th 2021 FINDINGS: Gallbladder: Prior cholecystectomy. Common bile duct: Diameter: 14 mm Liver: Left lobe of the liver is difficult to  visualize. Within limitations, no focal lesion identified. Increased parenchymal echogenicity. Portal vein is patent on color Doppler imaging with normal direction of blood flow towards the liver. Other: None. IMPRESSION: 1. Dilated common bile duct, unchanged when compared with the prior CTs and likely due to post cholecystectomy state. 2. Hepatic steatosis. Electronically Signed   By: Allegra Lai M.D.   On: 12/01/2022 13:33    Lab Results: Recent Labs    12/01/22 0843 12/02/22 0547 12/03/22 0609  WBC 5.3 4.5 4.7  HGB 8.9* 8.3* 8.3*  HCT 28.1* 26.7* 26.6*  PLT 153 167 156   BMET Recent Labs    11/30/22 1224 12/01/22 0843 12/02/22 0547  NA 139 138 140  K 5.1 4.0 3.6  CL 105 107 110  CO2 25 22 24   GLUCOSE 116* 101* 102*  BUN 22 15 8   CREATININE 1.02* 0.86 0.81  CALCIUM 9.2 8.0* 8.0*   LFT Recent Labs    12/02/22 0547  PROT 4.7*  ALBUMIN 2.7*  AST 20  ALT 18  ALKPHOS 46  BILITOT 0.3   PT/INR Recent Labs    11/30/22 1620  LABPROT 13.7  INR 1.0     Scheduled Meds:  atorvastatin  10 mg Oral Daily   busPIRone  7.5 mg Oral BID   diltiazem  240 mg Oral Daily   DULoxetine  60 mg Oral Daily   loratadine  10 mg Oral Daily   losartan  50 mg Oral Daily  pantoprazole  40 mg Oral BID AC   Continuous Infusions:  sodium chloride 10 mL/hr at 12/02/22 1805     Impression/Plan:   Hematochezia without hemodynamic compromise. HGB 11.8-->8.9 -- 8.3 Patient has had diverticular bleed in the past.  With clinical presentation of large painless hematochezia, most likely this is a diverticular bleed.  Last colonoscopy 2021  Stat Nuclear med tagged scan ordered today Last HGB stable 8.3, will get H/H now, will get repeat 4 hours.  Clear liquid diet Will discuss with Dr. Lavon Paganini pending red tagged cell, can prep tonight for colon/EGD. NPO at 5 AM Risk of bowel prep, conscious sedation, and EGD and colonoscopy were discussed.  Risks include but are not limited to  dehydration, pain, bleeding, cardiopulmonary process, bowel perforation, or other possible adverse outcomes..  Treatment plan was discussed with patient, and agreed upon.  RUQ pain s/p cholecystectomy Ectatic central intrahepatic biliary tree and CBD, stable. S/p gastric bypass surgery, moderate HH Right upper quadrant abdominal ultrasound unremarkable Liver function normal Continue tylenol as needed Continue PPI BID Will consider EGD with tarry stools.    GERD EGD 04/2019 sleeve gastrectomy + stricture s/p dilation Currently on protonix infusion, on nexium at home Some dysphagia intermittent, no GERD PO protonix to IV protonix  Afib Not on anticoagulation NSR at this time   Morbid obesity BMI 42  Principal Problem:   Rectal bleeding Active Problems:   Hematochezia   Diverticular hemorrhage    LOS: 2 days   [name]  12/03/2022, 11:30 AM   Attending physician's note   I have taken a history, reviewed the chart and examined the patient. I performed a substantive portion of this encounter, including complete performance of at least one of the key components, in conjunction with the APP. I agree with the APP's note, impression and recommendations.    Patient had episodes of rectal bleeding, currently undergoing nuclear med tagged scan Likely etiology diverticular hemorrhage No repeat CBC since 6 AM Continue to monitor and supportive care    K. Scherry Ran , MD [phone]

## 2022-12-03 NOTE — Care Management Important Message (Signed)
Important Message  Patient Details  Name: [name] MRN: [id number] Date of Birth: [birth year]   Medicare Important Message Given:  Yes     Mardene Sayer 12/03/2022, 2:58 PM

## 2022-12-03 NOTE — Plan of Care (Signed)

## 2022-12-04 ENCOUNTER — Other Ambulatory Visit: Payer: Self-pay | Admitting: Physician Assistant

## 2022-12-04 DIAGNOSIS — K625 Hemorrhage of anus and rectum: Secondary | ICD-10-CM | POA: Diagnosis not present

## 2022-12-04 DIAGNOSIS — K5730 Diverticulosis of large intestine without perforation or abscess without bleeding: Secondary | ICD-10-CM | POA: Diagnosis not present

## 2022-12-04 LAB — CBC
HCT: 28.3 % — ABNORMAL LOW (ref 36.0–46.0)
Hemoglobin: 9.1 g/dL — ABNORMAL LOW (ref 12.0–15.0)
MCH: 32.2 pg (ref 26.0–34.0)
MCHC: 32.2 g/dL (ref 30.0–36.0)
MCV: 100 fL (ref 80.0–100.0)
Platelets: 179 10*3/uL (ref 150–400)
RBC: 2.83 MIL/uL — ABNORMAL LOW (ref 3.87–5.11)
RDW: 12.8 % (ref 11.5–15.5)
WBC: 5.2 10*3/uL (ref 4.0–10.5)
nRBC: 0 % (ref 0.0–0.2)

## 2022-12-04 NOTE — Progress Notes (Signed)
[facility] ROUNDING NOTE   Subjective: She had episodes of dark maroon bowel movements Nuclear med RBC tagged scan negative for active GI bleed Hemoglobin is stable   Objective: Vital signs in last 24 hours: Temp:  [97.4 F (36.3 C)-97.9 F (36.6 C)] 97.7 F (36.5 C) (09/14 0748) Pulse Rate:  [78-91] 84 (09/14 0748) Resp:  [17-18] 17 (09/14 0748) BP: (131-144)/(53-75) 139/75 (09/14 0748) SpO2:  [96 %-100 %] 100 % (09/14 0748) Last BM Date : 12/03/22 General: NAD Abdomen: Soft nontender no distention    Intake/Output from previous day: No intake/output data recorded. Intake/Output this shift: No intake/output data recorded.   Lab Results: Recent Labs    12/02/22 0547 12/03/22 0609 12/03/22 1735 12/04/22 0743  WBC 4.5 4.7  --  5.2  HGB 8.3* 8.3* 8.9* 9.1*  PLT 167 156  --  179  MCV 97.8 100.8*  --  100.0   BMET Recent Labs    12/02/22 0547 12/03/22 1735  NA 140 138  K 3.6 3.2*  CL 110 106  CO2 24 20*  GLUCOSE 102* 95  BUN 8 10  CREATININE 0.81 0.92  CALCIUM 8.0* 8.5*   LFT Recent Labs    12/02/22 0547  PROT 4.7*  ALBUMIN 2.7*  AST 20  ALT 18  ALKPHOS 46  BILITOT 0.3   PT/INR No results for input(s): "INR" in the last 72 hours.    Imaging/Other results: NM GI Blood Loss  Addendum Date: 12/03/2022   ADDENDUM REPORT: 12/03/2022 18:05 ADDENDUM: CLINICAL DATA: Rectal bleeding. EXAM: NUCLEAR MEDICINE GASTROINTESTINAL BLEEDING SCAN TECHNIQUE: Sequential abdominal images were obtained following intravenous administration of Tc-59m labeled red blood cells. RADIOPHARMACEUTICALS: 25.0 mCi Tc-84m pertechnetate in-vitro labeled red cells. COMPARISON: CTA abdomen and pelvis 11/30/2022. FINDINGS: No abnormal areas of increased radiotracer uptake identified to indicate active GI bleeding. Normal physiologic distribution of the radiopharmaceutical is noted within the blood pool, liver, spleen and urinary bladder. IMPRESSION: Examination is  negative for active GI bleeding Electronically Signed   By: Signa Kell M.D.   On: 12/03/2022 18:05   Result Date: 12/03/2022 CLINICAL DATA:  Rectal bleeding. EXAM: NUCLEAR MEDICINE GASTROINTESTINAL BLEEDING SCAN TECHNIQUE: Sequential abdominal images were obtained following intravenous administration of Tc-68m labeled red blood cells. RADIOPHARMACEUTICALS:  25.0 mCi Tc-92m pertechnetate in-vitro labeled red cells. COMPARISON:  CTA abdomen and pelvis 11/30/2022. FINDINGS: No abnormal areas of increased radiotracer uptake identified to indicate active GI bleeding. Normal physiologic distribution of the radiopharmaceutical is noted within the blood pool, liver, spleen and urinary bladder. IMPRESSION: Examination is negative for acute pulmonary embolus. Electronically Signed: By: Signa Kell M.D. On: 12/03/2022 17:31      Assessment &Plan  77 year old very pleasant female with history of sleeve gastrectomy, A-fib currently not on anticoagulation, colonic diverticulosis presented with painless hematochezia consistent with acute diverticular hemorrhage  She is passing dark stool with maroon clots, likely residual blood in the colon Hemodynamically stable No significant drop in hemoglobin since yesterday Continue soft diet No plan for repeat endoscopic evaluation at this point If hemoglobin remains stable, she can potentially go home tomorrow morning Will arrange for outpatient CBC next week and GI follow-up as needed    K. Scherry Ran , MD [phone]  [facility]

## 2022-12-04 NOTE — Progress Notes (Signed)
HOSPITALIST ROUNDING NOTE [name] MVH:[id number]  DOB: 1945/04/10  DOA: 11/30/2022  PCP: Cleatis Polka., MD 12/04/2022,9:31 AM   LOS: 3 days      Code Status: full    From: Home  current Dispo: Likely home     89 white 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 Adela Lank back in 2021 Also atrial fibrillation CHADVASC >4 previously on Eliquis but this was discontinued in 2021 HTN Morbid obesity BMI 42  Admit with RUQ pain radiating to flank only taking Tylenol and Alka-Seltzer-9/6 developed rectal bleeding explosion bright red blood and dark blood Hemoglobin dropped from 12 to about 9 with no elevation in BUN 9/11 GI consulted 9/13 nuclear medicine study was negative for acute bleed-GI planning endoscopic workup  Plan  Hematochezia without shocklike physiology--prior gastric sleeve 2016 [done because needed to the lower BMI for bilateral hip surgery under Dr. Charlann Boxer No further bleeding since yesterday morning-nuclear study above-Protonix 40 twice daily-continues clear liquid diet or as per GI Await endoscopy  RUQ pain Mild dilatation of CBD but this is chronic-no further workup  Hepatic steatosis Outpatient eval, FibroSure testing etc. etc. given patient habitus  Atrial fibrillation CHADVASC >4-not on anticoagulation Continue Cardizem 240 daily has been off Eliquis since hospitalization in 2021 Will need to follow-up with A-fib clinic (last seen 08/2022) versus general cardiology and will send a message to them  HTN continue losartan 50 Blood pressure is controlled  Mild dementia,?  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   DVT prophylaxis: SCD at this time  Status is: Observation The patient will require care spanning > 2 midnights and should  be moved to inpatient because:   Requires clearance from GI prior to discharge    Subjective:  Seen late yesterday evening and seen this morning early with her daughter at the bedside She has had no further stool Hemoglobin is actually higher than it was previously  Objective + exam Vitals:   12/03/22 1813 12/03/22 1929 12/04/22 0504 12/04/22 0748  BP: (!) 144/64 (!) 142/67 (!) 131/53 139/75  Pulse: 91 83 78 84  Resp: 17 18 18 17   Temp: (!) 97.4 F (36.3 C) 97.7 F (36.5 C) (!) 97.5 F (36.4 C) 97.7 F (36.5 C)  TempSrc: Oral Oral Oral Oral  SpO2: 99% 98% 96% 100%  Weight:      Height:       Filed Weights   11/30/22 1220  Weight: 105.2 kg    Examination:  Eomi NCAT no focal deficit Chest is clear no rales or rhonchi S1-S2 no murmur Abdomen obese soft No lower extremity edema  Data Reviewed: reviewed   CBC    Component Value Date/Time   WBC 5.2 12/04/2022 0743   RBC 2.83 (L) 12/04/2022 0743   HGB 9.1 (L) 12/04/2022 0743   HGB 13.0 02/15/2017 1207   HCT 28.3 (L) 12/04/2022 0743   HCT 40.0 02/15/2017 1207   PLT 179 12/04/2022 0743   MCV 100.0 12/04/2022 0743   MCV 94 02/15/2017 1207   MCH 32.2 12/04/2022 0743   MCHC 32.2 12/04/2022 0743   RDW 12.8 12/04/2022 0743   RDW 13.3 02/15/2017 1207   LYMPHSABS 2.4 10/02/2019 1243   LYMPHSABS 1.4 02/15/2017 1207   MONOABS 0.5 10/02/2019 1243   EOSABS 0.0 10/02/2019 1243  EOSABS 0.1 02/15/2017 1207   BASOSABS 0.0 10/02/2019 1243   BASOSABS 0.0 02/15/2017 1207      Latest Ref Rng & Units 12/03/2022    5:35 PM 12/02/2022    5:47 AM 12/01/2022    8:43 AM  CMP  Glucose 70 - 99 mg/dL 95  829  562   BUN 8 - 23 mg/dL 10  8  15    Creatinine 0.44 - 1.00 mg/dL 1.30  8.65  7.84   Sodium 135 - 145 mmol/L 138  140  138   Potassium 3.5 - 5.1 mmol/L 3.2  3.6  4.0   Chloride 98 - 111 mmol/L 106  110  107   CO2 22 - 32 mmol/L 20  24  22    Calcium 8.9 - 10.3 mg/dL 8.5  8.0  8.0   Total Protein 6.5 - 8.1 g/dL  4.7     Total Bilirubin 0.3 - 1.2 mg/dL  0.3    Alkaline Phos 38 - 126 U/L  46    AST 15 - 41 U/L  20    ALT 0 - 44 U/L  18       Scheduled Meds:  atorvastatin  10 mg Oral Daily   busPIRone  7.5 mg Oral BID   diltiazem  240 mg Oral Daily   DULoxetine  60 mg Oral Daily   loratadine  10 mg Oral Daily   losartan  50 mg Oral Daily   pantoprazole  40 mg Oral BID AC   Continuous Infusions:  sodium chloride 10 mL/hr at 12/02/22 1805    Time  46  Rhetta Mura, MD  [facility]

## 2022-12-04 NOTE — Plan of Care (Signed)

## 2022-12-04 NOTE — Plan of Care (Signed)

## 2022-12-05 ENCOUNTER — Other Ambulatory Visit: Payer: Self-pay | Admitting: Physician Assistant

## 2022-12-05 DIAGNOSIS — K625 Hemorrhage of anus and rectum: Secondary | ICD-10-CM | POA: Diagnosis not present

## 2022-12-05 DIAGNOSIS — D500 Iron deficiency anemia secondary to blood loss (chronic): Secondary | ICD-10-CM

## 2022-12-05 LAB — CBC WITH DIFFERENTIAL/PLATELET
Abs Immature Granulocytes: 0.01 10*3/uL (ref 0.00–0.07)
Basophils Absolute: 0 10*3/uL (ref 0.0–0.1)
Basophils Relative: 1 %
Eosinophils Absolute: 0.1 10*3/uL (ref 0.0–0.5)
Eosinophils Relative: 1 %
HCT: 26.8 % — ABNORMAL LOW (ref 36.0–46.0)
Hemoglobin: 8.5 g/dL — ABNORMAL LOW (ref 12.0–15.0)
Immature Granulocytes: 0 %
Lymphocytes Relative: 30 %
Lymphs Abs: 1.3 10*3/uL (ref 0.7–4.0)
MCH: 31.7 pg (ref 26.0–34.0)
MCHC: 31.7 g/dL (ref 30.0–36.0)
MCV: 100 fL (ref 80.0–100.0)
Monocytes Absolute: 0.3 10*3/uL (ref 0.1–1.0)
Monocytes Relative: 7 %
Neutro Abs: 2.7 10*3/uL (ref 1.7–7.7)
Neutrophils Relative %: 61 %
Platelets: 168 10*3/uL (ref 150–400)
RBC: 2.68 MIL/uL — ABNORMAL LOW (ref 3.87–5.11)
RDW: 12.8 % (ref 11.5–15.5)
WBC: 4.4 10*3/uL (ref 4.0–10.5)
nRBC: 0.5 % — ABNORMAL HIGH (ref 0.0–0.2)

## 2022-12-05 MED ORDER — PANTOPRAZOLE SODIUM 40 MG PO TBEC: 40.0000 mg | DELAYED_RELEASE_TABLET | Freq: Two times a day (BID) | ORAL | 3 refills | Status: DC

## 2022-12-05 NOTE — Discharge Summary (Signed)
Physician Discharge Summary  [name] WUJ:[id number] DOB: 05-21-1945 DOA: 11/30/2022  PCP: Cleatis Polka., MD  Admit date: 11/30/2022 Discharge date: 12/05/2022  Time spent: 50 minutes  Recommendations for Outpatient Follow-up:  Med change, Prilosec to Protonix twice daily Needs CBC and differential in about [redacted] week along with Chem-7 Recommend outpatient evaluation for colonoscopy/endoscopy within about a month Absolutely no nonsteroidals forward for now PCP to discuss weight loss strategies given hepatic steatosis as well as slight thrombocytopenia probably related to this  Discharge Diagnoses:  MAIN problem for hospitalization  probable diverticular bleed Some hepatic steatosis with dilated CBD/slight thrombocytopenia Mild hypokalemia and hyperkalemia   Please see below for itemized issues addressed in HOpsital- refer to other progress notes for clarity if needed  Discharge Condition: Improved  Diet recommendation: Heart healthy  Filed Weights   11/30/22 1220 12/05/22 0519  Weight: 105.2 kg 105.2 kg    History of present illness:   34 white 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 Adela Lank back in 2021 Also atrial fibrillation CHADVASC >4 previously on Eliquis but this was discontinued in 2021 HTN Morbid obesity BMI 42   Admit with RUQ pain radiating to flank only taking Tylenol and Alka-Seltzer-9/6 developed rectal bleeding explosion bright red blood and dark blood Hemoglobin dropped from 12 to about 9 with no elevation in BUN 9/11 GI consulted 9/13 nuclear medicine study was negative for acute bleed 9/14 GI cleared patient for outpatient follow-up  Hospital Course:  Hematochezia without shocklike physiology--prior gastric sleeve 2016 [done because needed to the lower BMI for bilateral hip surgery under Dr. Charlann Boxer No further bleeding since  9/13-change Prilosec to Protonix twice daily-no plan currently for inpatient colon or esophageal evaluation with scope-defer to GI who are aware   RUQ pain Mild dilatation of CBD but this is chronic-no further workup   Hepatic steatosis Outpatient eval, FibroSure testing etc. etc. given patient habitus   Atrial fibrillation CHADVASC >4-not on anticoagulation Continue Cardizem 240 daily has been off Eliquis since hospitalization in 2021 Will need to follow-up with A-fib clinic (last seen 08/2022) versus general cardiology and will send a message to them   HTN continue losartan 50 Blood pressure is controlled   Mild dementia,?  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 mild   Discharge Exam: Vitals:   12/04/22 1948 12/05/22 0503  BP: (!) 125/57 (!) 111/54  Pulse: 84 82  Resp: 18 18  Temp: 97.7 F (36.5 C) 97.6 F (36.4 C)  SpO2: 100% 96%    Subj on day of d/c   Pleasant awake coherent no distress no bleeding eating and drinking fine  General Exam on discharge  EOMI NCAT thick neck Mallampati 4 no icterus no pallor Chest is clear no added sound S1-S2 no murmur Abdomen soft slightly tender in RUQ No lower extremity edema  Discharge Instructions   Discharge Instructions     Diet - low sodium heart healthy   Complete by: As directed    Discharge instructions   Complete by: As directed    This hospitalization you were diagnosed with what is termed as a diverticular bleed-this is some bleeding from your colon which might of occurred because of risk factors of aging and slow sluggish movement  of your gut and sticking of residual fluid particles that are causing irritation-usually these resolve on their own-you have been changed to Protonix twice a day and will need a close follow-up with your  regular gastroenterologist to determine when we can do another colonoscopy-as long as you are not bleeding it is quite reasonable to continue all of your other meds without change as we have gone over them Make sure that you look at your list carefully as we have discussed these meds with you   in the outpatient setting you will also need to follow-up with your primary physician for labs in about a month Please ensure that you get follow-up  Have a nice fall   Increase activity slowly   Complete by: As directed       Allergies as of 12/05/2022       Reactions   Ativan [lorazepam] Other (See Comments)   Agitation, aggressive actions, significant mental changes   Cefdinir Hives   headache   Buprenorphine Hcl Itching, Rash        Medication List     STOP taking these medications    dicyclomine 10 MG capsule Commonly known as: BENTYL   esomeprazole 40 MG capsule Commonly known as: NEXIUM   memantine 5 MG tablet Commonly known as: NAMENDA       TAKE these medications    acetaminophen 500 MG tablet Commonly known as: TYLENOL Take 1,000 mg by mouth every 6 (six) hours as needed (for pain.).   albuterol 108 (90 Base) MCG/ACT inhaler Commonly known as: VENTOLIN HFA Inhale 2 puffs into the lungs every 6 (six) hours as needed for shortness of breath or wheezing.   ALPRAZolam 0.25 MG tablet Commonly known as: XANAX Take 0.25 mg by mouth daily as needed for anxiety.   BIOTIN PO Take 1 tablet by mouth daily.   colestipol 1 g tablet Commonly known as: Colestid Take once daily and titrate as needed   cyanocobalamin 1000 MCG tablet Commonly known as: VITAMIN B12 Take 1,000 mcg by mouth daily.   cyclobenzaprine 10 MG tablet Commonly known as: FLEXERIL Take 10 mg by mouth 3 (three) times daily as needed for muscle spasms.   Dialyvite Vitamin D 5000 125 MCG (5000 UT) capsule Generic drug: Cholecalciferol Take 5,000 Units by mouth 2 (two) times daily.   diltiazem  240 MG 24 hr capsule Commonly known as: CARDIZEM CD TAKE 1 CAPSULE BY MOUTH EVERY DAY What changed: how much to take   loratadine 10 MG tablet Commonly known as: CLARITIN Take 10 mg by mouth daily.   losartan 50 MG tablet Commonly known as: COZAAR Take 50 mg by mouth daily.   Melatonin 10 MG Tabs Take 10 mg by mouth at bedtime.   multivitamin with minerals Tabs tablet Take 1 tablet by mouth daily. Centrum   ondansetron 8 MG disintegrating tablet Commonly known as: ZOFRAN-ODT DISSOLVE 1 TABLET IN MOUTH EVERY 8 HRS AS NEEDED NAUSEA What changed: See the new instructions.   pantoprazole 40 MG tablet Commonly known as: PROTONIX Take 1 tablet (40 mg total) by mouth 2 (two) times daily before a meal.   simvastatin 20 MG tablet Commonly known as: ZOCOR Take 1 tablet (20 mg total) by mouth daily.       Allergies  Allergen Reactions   Ativan [Lorazepam] Other (See Comments)    Agitation, aggressive actions, significant mental changes   Cefdinir Hives    headache   Buprenorphine Hcl Itching and Rash  The results of significant diagnostics from this hospitalization (including imaging, microbiology, ancillary and laboratory) are listed below for reference.    Significant Diagnostic Studies: NM GI Blood Loss  Addendum Date: 12/03/2022   ADDENDUM REPORT: 12/03/2022 18:05 ADDENDUM: CLINICAL DATA: Rectal bleeding. EXAM: NUCLEAR MEDICINE GASTROINTESTINAL BLEEDING SCAN TECHNIQUE: Sequential abdominal images were obtained following intravenous administration of Tc-43m labeled red blood cells. RADIOPHARMACEUTICALS: 25.0 mCi Tc-68m pertechnetate in-vitro labeled red cells. COMPARISON: CTA abdomen and pelvis 11/30/2022. FINDINGS: No abnormal areas of increased radiotracer uptake identified to indicate active GI bleeding. Normal physiologic distribution of the radiopharmaceutical is noted within the blood pool, liver, spleen and urinary bladder. IMPRESSION: Examination is negative for  active GI bleeding Electronically Signed   By: Signa Kell M.D.   On: 12/03/2022 18:05   Result Date: 12/03/2022 CLINICAL DATA:  Rectal bleeding. EXAM: NUCLEAR MEDICINE GASTROINTESTINAL BLEEDING SCAN TECHNIQUE: Sequential abdominal images were obtained following intravenous administration of Tc-30m labeled red blood cells. RADIOPHARMACEUTICALS:  25.0 mCi Tc-25m pertechnetate in-vitro labeled red cells. COMPARISON:  CTA abdomen and pelvis 11/30/2022. FINDINGS: No abnormal areas of increased radiotracer uptake identified to indicate active GI bleeding. Normal physiologic distribution of the radiopharmaceutical is noted within the blood pool, liver, spleen and urinary bladder. IMPRESSION: Examination is negative for acute pulmonary embolus. Electronically Signed: By: Signa Kell M.D. On: 12/03/2022 17:31   MR BRAIN W WO CONTRAST  Result Date: 12/02/2022 CLINICAL DATA:  Provided history: Mild cognitive impairment. Additional history provided by the scanning technologist: History of hypertension. EXAM: MRI HEAD WITHOUT AND WITH CONTRAST TECHNIQUE: Multiplanar, multiecho pulse sequences of the brain and surrounding structures were obtained without and with intravenous contrast. CONTRAST:  10 mL Vueway intravenous contrast. COMPARISON:  Brain MRI 10/13/2014. FINDINGS: Brain: Mild generalized cerebral atrophy. Multifocal T2 FLAIR hyperintense signal abnormality within the cerebral white matter (mild) and pons (moderate). Findings are nonspecific, but compatible with chronic small vessel image disease. No cortical encephalomalacia is identified. There is no acute infarct. No evidence of an intracranial mass. No chronic intracranial blood products. No extra-axial fluid collection. No midline shift. No pathologic intracranial enhancement identified. Vascular: Maintained flow voids within the proximal large arterial vessels. Skull and upper cervical spine: No focal suspicious marrow lesion. Sinuses/Orbits: No mass  or acute finding within the imaged orbits. Prior bilateral ocular lens replacement. No significant inflammatory paranasal sinus disease. IMPRESSION: 1. No evidence of an acute intracranial abnormality. 2. Chronic small vessel ischemic changes which are mild in the cerebral white matter, and moderate in the pons, progressed from the prior brain MRI of 10/13/2014. 3. Mild generalized cerebral atrophy. Electronically Signed   By: Jackey Loge D.O.   On: 12/02/2022 09:25   US ABDOMEN LIMITED RUQ (LIVER/GB)  Result Date: 12/01/2022 CLINICAL DATA:  Right upper quadrant pain EXAM: ULTRASOUND ABDOMEN LIMITED RIGHT UPPER QUADRANT COMPARISON:  GI bleed CTA dated November 30, 2022; CT abdomen and pelvis dated July 25th 2021 FINDINGS: Gallbladder: Prior cholecystectomy. Common bile duct: Diameter: 14 mm Liver: Left lobe of the liver is difficult to visualize. Within limitations, no focal lesion identified. Increased parenchymal echogenicity. Portal vein is patent on color Doppler imaging with normal direction of blood flow towards the liver. Other: None. IMPRESSION: 1. Dilated common bile duct, unchanged when compared with the prior CTs and likely due to post cholecystectomy state. 2. Hepatic steatosis. Electronically Signed   By: Allegra Lai M.D.   On: 12/01/2022 13:33   CT ANGIO GI BLEED  Result Date: 11/30/2022 CLINICAL DATA:  Rectal  bleeding EXAM: CTA ABDOMEN AND PELVIS WITHOUT AND WITH CONTRAST TECHNIQUE: Multidetector CT imaging of the abdomen and pelvis was performed using the standard protocol during bolus administration of intravenous contrast. Multiplanar reconstructed images and MIPs were obtained and reviewed to evaluate the vascular anatomy. RADIATION DOSE REDUCTION: This exam was performed according to the departmental dose-optimization program which includes automated exposure control, adjustment of the mA and/or kV according to patient size and/or use of iterative reconstruction technique.  CONTRAST:  OMNIPAQUE IOHEXOL 350 MG/ML SOLN COMPARISON:  08/07/2020 and previous FINDINGS: VASCULAR Aorta: Moderate partially calcified atheromatous plaque without aneurysm, dissection, for for Celiac: Calcified ostial plaque without significant stenosis, patent distally. SMA: Eccentric short-segment calcified ostial plaque without high-grade stenosis, patent distally with classic branch anatomy. Renals: Single bilaterally, both with calcified ostial plaque resulting in mild stenosis, patent distally. IMA: Patent without evidence of aneurysm, dissection, vasculitis or significant stenosis. Inflow: Mild scattered calcified plaque in bilateral common and internal iliac arteries. No aneurysm or stenosis. External iliac arteries normal. Proximal Outflow: Bilateral common femoral and visualized portions of the superficial and profunda femoral arteries are patent without evidence of aneurysm, dissection, vasculitis or significant stenosis. Veins: Patent hepatic veins, portal vein, SMV, splenic vein, bilateral renal veins, iliac venous system and IVC. Review of the MIP images confirms the above findings. NON-VASCULAR Lower chest: No pleural or pericardial effusion. Visualized lung bases clear. Hepatobiliary: No focal liver abnormality is seen. Status post cholecystectomy. Ectatic central intrahepatic biliary tree and CBD, stable. Pancreas: Unremarkable. No pancreatic ductal dilatation or surrounding inflammatory changes. Spleen: Normal in size without focal abnormality. Adrenals/Urinary Tract: Chronic linear right adrenal calcifications without mass. No urolithiasis. No hydronephrosis. Stable 1.5 cm left upper pole cyst; no follow-up recommended. Urinary bladder physiologically distended. Stomach/Bowel: No active GI bleed. Moderate hiatal hernia. Post gastric bypass surgery. Small bowel decompressed. Appendix not identified. The colon is partially distended with multiple scattered diverticula throughout, most  numerous in the distal descending and sigmoid segments, without adjacent inflammatory change. Lymphatic: No abdominal or pelvic adenopathy. Reproductive: Status post hysterectomy. No adnexal masses. Other: No ascites.  No free air. Musculoskeletal: Mild spondylitic changes throughout the visualized lower thoracic and lumbar spine. Bilateral hip arthroplasty components project in expected location. No fracture or worrisome bone lesion. IMPRESSION: 1. No active GI bleed. 2. Colonic diverticulosis without acute inflammation. 3. Moderate hiatal hernia. 4.  Aortic Atherosclerosis (ICD10-I70.0). Electronically Signed   By: Corlis Leak M.D.   On: 11/30/2022 19:06   CUP PACEART REMOTE DEVICE CHECK  Result Date: 11/29/2022 ILR summary report received. Battery status OK. Normal device function. No new symptom, tachy, brady, or pause episodes. No new AF episodes. Monthly summary reports and ROV/PRN - CS, CVRS   Microbiology: No results found for this or any previous visit (from the past 240 hour(s)).   Labs: Basic Metabolic Panel: Recent Labs  Lab 11/30/22 1224 12/01/22 0843 12/02/22 0547 12/03/22 1735  NA 139 138 140 138  K 5.1 4.0 3.6 3.2*  CL 105 107 110 106  CO2 25 22 24  20*  GLUCOSE 116* 101* 102* 95  BUN 22 15 8 10   CREATININE 1.02* 0.86 0.81 0.92  CALCIUM 9.2 8.0* 8.0* 8.5*   Liver Function Tests: Recent Labs  Lab 11/30/22 1224 12/02/22 0547  AST 21 20  ALT 20 18  ALKPHOS 58 46  BILITOT 0.3 0.3  PROT 6.1* 4.7*  ALBUMIN 3.4* 2.7*   Recent Labs  Lab 11/30/22 1242  LIPASE 36   No results  for input(s): "AMMONIA" in the last 168 hours. CBC: Recent Labs  Lab 12/01/22 0843 12/02/22 0547 12/03/22 0609 12/03/22 1735 12/04/22 0743 12/05/22 0754  WBC 5.3 4.5 4.7  --  5.2 4.4  NEUTROABS  --   --   --   --   --  2.7  HGB 8.9* 8.3* 8.3* 8.9* 9.1* 8.5*  HCT 28.1* 26.7* 26.6* 28.6* 28.3* 26.8*  MCV 98.3 97.8 100.8*  --  100.0 100.0  PLT 153 167 156  --  179 168   Cardiac  Enzymes: No results for input(s): "CKTOTAL", "CKMB", "CKMBINDEX", "TROPONINI" in the last 168 hours. BNP: BNP (last 3 results) No results for input(s): "BNP" in the last 8760 hours.  ProBNP (last 3 results) No results for input(s): "PROBNP" in the last 8760 hours.  CBG: No results for input(s): "GLUCAP" in the last 168 hours.     Signed:  Rhetta Mura MD   [facility] 12/05/2022, 8:57 AM

## 2022-12-09 ENCOUNTER — Other Ambulatory Visit (INDEPENDENT_AMBULATORY_CARE_PROVIDER_SITE_OTHER): Payer: Medicare Other

## 2022-12-09 DIAGNOSIS — D500 Iron deficiency anemia secondary to blood loss (chronic): Secondary | ICD-10-CM

## 2022-12-09 LAB — CBC WITH DIFFERENTIAL/PLATELET
Basophils Absolute: 0 10*3/uL (ref 0.0–0.1)
Basophils Relative: 0.6 % (ref 0.0–3.0)
Eosinophils Absolute: 0 10*3/uL (ref 0.0–0.7)
Eosinophils Relative: 0.6 % (ref 0.0–5.0)
HCT: 28.3 % — ABNORMAL LOW (ref 36.0–46.0)
Hemoglobin: 9 g/dL — ABNORMAL LOW (ref 12.0–15.0)
Lymphocytes Relative: 21.9 % (ref 12.0–46.0)
Lymphs Abs: 1.3 10*3/uL (ref 0.7–4.0)
MCHC: 31.7 g/dL (ref 30.0–36.0)
MCV: 95.6 fl (ref 78.0–100.0)
Monocytes Absolute: 0.4 10*3/uL (ref 0.1–1.0)
Monocytes Relative: 6.8 % (ref 3.0–12.0)
Neutro Abs: 4.1 10*3/uL (ref 1.4–7.7)
Neutrophils Relative %: 70.1 % (ref 43.0–77.0)
Platelets: 261 10*3/uL (ref 150.0–400.0)
RBC: 2.96 Mil/uL — ABNORMAL LOW (ref 3.87–5.11)
RDW: 13.4 % (ref 11.5–15.5)
WBC: 5.9 10*3/uL (ref 4.0–10.5)

## 2022-12-11 NOTE — Progress Notes (Unsigned)
Cardiology Clinic Note   Patient Name: [name] Date of Encounter: 12/13/2022  Primary Care Provider:  Cleatis Polka., MD Primary Cardiologist:  Nanetta Batty, MD  Patient Profile    77 year old female with history of HTN, HLD, Afib CHADS VASC Score of 3, ILR recently reviewed with no evidence of atrial fibrillation, obesity (hx of bariatric surgery/gastric sleeve) ILR in situ.  She is not on anticoagulation due to hx of GIB.  When seen last her husband was insistent that she be taken off of diltiazem as she had been using her inhaler more often as he believed that this was causing her symptoms.     Blood pressure was low normal she was to take her blood pressure daily at home to correlate.  She was also scheduled to have a sleep study on follow-up if she continues her shortness of breath.   Unfortunately, the patient was admitted to the hospital between 11/30/2018 for 12/05/2022 in the setting of diverticular bleed, with history of prior gastric sleeve.  She complained of significant right upper quadrant pain radiating to the flank and found to have hematochezia.  Was noted that she also had hepatic steatosis and was to follow-up with GI. She was continued on diltiazem for atrial fibrillation. Has not been on anticoagulation.   Past Medical History    Past Medical History:  Diagnosis Date   Allergy    Anemia    Antral polyp    benign   Anxiety    Arthritis    Asthma    B12 deficiency    Back pain    Chest pain    Constipation    Depression    Diverticulosis    Gallbladder problem    GERD (gastroesophageal reflux disease) 09/09/2011   History of transfusion    HTN (hypertension) 09/09/2011   Hyperlipemia 09/09/2011   Hypertension    IBS (irritable bowel syndrome)    Internal hemorrhoid    Joint pain    Obesity    Osteoarthritis    Paroxysmal atrial fibrillation (HCC)    Pneumonia    PONV (postoperative nausea and vomiting)    with big surgeries, none with  endos etc, with colonoscopy- 5-6 years ago could not swallow    Shortness of breath    Sleep apnea    borderline per patient no cpap    SOB (shortness of breath) on exertion    Swallowing difficulty    Tachycardia    Unstable angina (HCC) 09/09/2011   Vitamin D deficiency    Vocal cord dysfunction 2013   swelling   Past Surgical History:  Procedure Laterality Date   ABDOMINAL HYSTERECTOMY  1988   ANKLE FUSION Right    x 2   BACK SURGERY  [phone]   3 ruptured discs, lower back   BILATERAL ANTERIOR TOTAL HIP ARTHROPLASTY Bilateral 11/12/2014   Procedure: BILATERAL ANTERIOR TOTAL HIP ARTHROPLASTY;  Surgeon: Durene Romans, MD;  Location: [facility];  Service: Orthopedics;  Laterality: Bilateral;   BRAVO PH STUDY [name] 03/06/2013   Procedure: BRAVO PH STUDY;  Surgeon: Louis Meckel, MD;  Location: [facility];  Service: Endoscopy;  Laterality: [name];   CARDIAC CATHETERIZATION  2013   NORMAL   CHOLECYSTECTOMY     COLONOSCOPY     ELBOW SURGERY Left    removed bone chip   ESOPHAGOGASTRODUODENOSCOPY [name] 03/06/2013   Procedure: ESOPHAGOGASTRODUODENOSCOPY (EGD);  Surgeon: Louis Meckel, MD;  Location: [facility];  Service: Endoscopy;  Laterality:  [name];   FOOT OSTEOTOMY W/ PLANTAR FASCIA RELEASE Left    ILIOTIBIAL BAND RELEASE Right 1997   implantable loop recorder placement  01/23/2020   Medtronic Reveal Linq model LNQ 22 (RLB [id number]) implantable loop recorder   JOINT REPLACEMENT     LAPAROSCOPIC GASTRIC SLEEVE RESECTION WITH HIATAL HERNIA REPAIR  08/20/2014   Procedure: LAPAROSCOPIC GASTRIC SLEEVE RESECTION WITH HIATAL HERNIA  REPAIR AND  UPPER ENDOSCOPY;  Surgeon: Luretha Murphy, MD;  Location: [facility];  Service: General;;   LEFT HEART CATHETERIZATION WITH CORONARY ANGIOGRAM Bilateral 09/10/2011   Procedure: LEFT HEART CATHETERIZATION WITH CORONARY ANGIOGRAM;  Surgeon: Runell Gess, MD;  Location: [facility] CATH LAB;  Service: Cardiovascular;  Laterality: Bilateral;   NASAL SINUS SURGERY   1997   REVERSE SHOULDER ARTHROPLASTY Right 05/14/2016   Procedure: RIGHT REVERSE SHOULDER ARTHROPLASTY;  Surgeon: Beverely Low, MD;  Location: [facility] OR;  Service: Orthopedics;  Laterality: Right;   REVERSE SHOULDER ARTHROPLASTY Left 05/16/2020   Procedure: REVERSE SHOULDER ARTHROPLASTY;  Surgeon: Beverely Low, MD;  Location: [facility];  Service: Orthopedics;  Laterality: Left;  interscalene exparel   SHOULDER SURGERY Left    tfc wrist Left 1997   thumb replacement Bilateral    one 2009 and one 2014   TOTAL ANKLE REPLACEMENT Right    x 3   TOTAL ANKLE REPLACEMENT Left    TOTAL ANKLE REPLACEMENT     TOTAL HIP ARTHROPLASTY Bilateral    TOTAL KNEE ARTHROPLASTY Bilateral    left 2002, right 2012   TOTAL SHOULDER ARTHROPLASTY Right 05/14/2016   TRANSTHORACIC ECHOCARDIOGRAM  09/09/2011   MILD CONCENTRIC LVH. TRACE MITRAL REGURG. TRACE TR. MILD AORTIC REGURG.   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   UPPER GI ENDOSCOPY  08/20/2014   Procedure: UPPER GI ENDOSCOPY;  Surgeon: Luretha Murphy, MD;  Location: [facility];  Service: General;;   WRIST SURGERY Left 1997, 2009     Allergies  Allergies  Allergen Reactions   Ativan [Lorazepam] Other (See Comments)    Agitation, aggressive actions, significant mental changes   Cefdinir Hives    headache   Buprenorphine Hcl Itching and Rash    History of Present Illness    She comes today with her husband feeling "awful" because she is dizzy, lightheaded, having urinary incontinence, dysuria, burning with urination, and itching.  They have contacted their primary care provider Dr. Clelia Croft, and he is requested that they come by his office for a UA/CS as well as blood work.   She continues to feel generalized fatigue.  Shortness of breath is improved.  She denies palpitations.  She continues on diltiazem as directed.  She brings with her a list of her blood pressures since hospitalization.  124/64 to 136/84 with heart rates in the 80s.   Home Medications     Current Outpatient Medications  Medication Sig Dispense Refill   acetaminophen (TYLENOL) 500 MG tablet Take 1,000 mg by mouth every 6 (six) hours as needed (for pain.).      albuterol (PROVENTIL HFA;VENTOLIN HFA) 108 (90 Base) MCG/ACT inhaler Inhale 2 puffs into the lungs every 6 (six) hours as needed for shortness of breath or wheezing.     ALPRAZolam (XANAX) 0.25 MG tablet Take 0.25 mg by mouth daily as needed for anxiety.     BIOTIN PO Take 1 tablet by mouth daily.     Cholecalciferol (DIALYVITE VITAMIN D 5000) 125 MCG (5000 UT) capsule Take 5,000 Units by mouth 2 (two) times daily.  cyclobenzaprine (FLEXERIL) 10 MG tablet Take 10 mg by mouth 3 (three) times daily as needed for muscle spasms.     diltiazem (CARDIZEM CD) 240 MG 24 hr capsule TAKE 1 CAPSULE BY MOUTH EVERY DAY (Patient taking differently: Take 240 mg by mouth daily.) 90 capsule 3   loratadine (CLARITIN) 10 MG tablet Take 10 mg by mouth daily.     losartan (COZAAR) 50 MG tablet Take 50 mg by mouth daily.     Melatonin 10 MG TABS Take 10 mg by mouth at bedtime.     Multiple Vitamin (MULTIVITAMIN WITH MINERALS) TABS tablet Take 1 tablet by mouth daily. Centrum     ondansetron (ZOFRAN-ODT) 8 MG disintegrating tablet DISSOLVE 1 TABLET IN MOUTH EVERY 8 HRS AS NEEDED NAUSEA (Patient taking differently: Take 8 mg by mouth as needed.) 30 tablet 1   pantoprazole (PROTONIX) 40 MG tablet Take 1 tablet (40 mg total) by mouth 2 (two) times daily before a meal. 60 tablet 3   simvastatin (ZOCOR) 20 MG tablet Take 1 tablet (20 mg total) by mouth daily. 30 tablet 11   vitamin B-12 (CYANOCOBALAMIN) 1000 MCG tablet Take 1,000 mcg by mouth daily.     colestipol (COLESTID) 1 g tablet Take once daily and titrate as needed (Patient not taking: Reported on 11/19/2022) 60 tablet 2   memantine (NAMENDA) 5 MG tablet TAKE 1 TABLET BY MOUTH EVERYDAY AT BEDTIME (Patient not taking: Reported on 12/13/2022) 90 tablet 4   No current facility-administered  medications for this visit.     Family History    Family History  Problem Relation Age of Onset   Hypertension Mother    Diabetes Mother    Arthritis Mother    Esophageal cancer Mother    Cancer Mother    Esophageal cancer Father 42   Cancer Father    Alcoholism Father    Other Sister        kidney mass removed   Hypertension Sister    Stroke Sister        died in 07-Jan-2019  Heart disease Maternal Grandmother    Stroke Paternal Grandmother    Coronary artery disease Neg Hx    Colon cancer Neg Hx    Liver disease Neg Hx    Kidney disease Neg Hx    Rectal cancer Neg Hx    Stomach cancer Neg Hx    She indicated that her mother is deceased. She indicated that her father is deceased. She indicated that her sister is deceased. She indicated that her maternal grandmother is deceased. She indicated that her paternal grandmother is deceased. She indicated that the status of her neg hx is unknown. She indicated that her other is deceased.  Social History    Social History   Socioeconomic History   Marital status: Married    Spouse name: John   Number of children: 2   Years of education: 12   Highest education level: Not on file  Occupational History   Occupation: retired    Associate Professor: RETIRED  Tobacco Use   Smoking status: Never   Smokeless tobacco: Never  Vaping Use   Vaping status: Never Used  Substance and Sexual Activity   Alcohol use: No   Drug use: No   Sexual activity: Yes    Partners: Male  Other Topics Concern   Not on file  Social History Narrative   Left handed   One floor home   Drinks caffeine free diet coke  Retired   Lives with husband and queen dog   Social Determinants of Corporate investment banker Strain: Not on file  Food Insecurity: No Food Insecurity (11/30/2022)   Hunger Vital Sign    Worried About Running Out of Food in the Last Year: Never true    Ran Out of Food in the Last Year: Never true  Transportation Needs: No  Transportation Needs (11/30/2022)   PRAPARE - Administrator, Civil Service (Medical): No    Lack of Transportation (Non-Medical): No  Physical Activity: Not on file  Stress: Not on file  Social Connections: Unknown (08/01/2021)   Received [facility]   Social Network    Social Network: Not on file  Intimate Partner Violence: Not At Risk (11/30/2022)   Humiliation, Afraid, Rape, and Kick questionnaire    Fear of Current or Ex-Partner: No    Emotionally Abused: No    Physically Abused: No    Sexually Abused: No     Review of Systems    General:  No chills, fever, night sweats or weight changes.  Dizziness fatigue Cardiovascular:  No chest pain, dyspnea on exertion, edema, orthopnea, palpitations, paroxysmal nocturnal dyspnea. Dermatological: No rash, lesions/masses Respiratory: No cough, dyspnea Urologic: No hematuria, positive for dysuria, frequency, incontinence.  Abdominal:   No nausea, vomiting, diarrhea, bright red blood per rectum, melena, or hematemesis Neurologic:  No visual changes, wkns, changes in mental status.  Positive for dizziness. All other systems reviewed and are otherwise negative except as noted above.       Physical Exam    VS:  BP 110/66 (BP Location: Left Arm, Patient Position: Sitting, Cuff Size: Normal)   Pulse 91   Ht 5\' 2"  (1.575 m)   Wt 230 lb (104.3 kg)   SpO2 91%   BMI 42.07 kg/m  , BMI Body mass index is 42.07 kg/m.     GEN: Well nourished, well developed, in no acute distress. HEENT: normal. Neck: Supple, no JVD, carotid bruits, or masses. Cardiac: IRRR, no murmurs, rubs, or gallops. No clubbing, cyanosis, edema.  Radials/DP/PT 2+ and equal bilaterally.  Respiratory:  Respirations regular and unlabored, scant inspiratory wheeze heard only once, otherwise clear to auscultation bilaterally. GI: Soft, nontender, nondistended, BS + x 4. MS: no deformity or atrophy. Skin: warm and dry, no rash. Neuro:  Strength  and sensation are intact. Psych: Normal affect.      Lab Results  Component Value Date   WBC 5.9 12/09/2022   HGB 9.0 (L) 12/09/2022   HCT 28.3 (L) 12/09/2022   MCV 95.6 12/09/2022   PLT 261.0 12/09/2022   Lab Results  Component Value Date   CREATININE 0.92 12/03/2022   BUN 10 12/03/2022   NA 138 12/03/2022   K 3.2 (L) 12/03/2022   CL 106 12/03/2022   CO2 20 (L) 12/03/2022   Lab Results  Component Value Date   ALT 18 12/02/2022   AST 20 12/02/2022   ALKPHOS 46 12/02/2022   BILITOT 0.3 12/02/2022   Lab Results  Component Value Date   CHOL 190 11/17/2017   HDL 95 11/17/2017   LDLCALC 83 11/17/2017   TRIG 60 11/17/2017    Lab Results  Component Value Date   HGBA1C 5.6 11/17/2017     Review of Prior Studies ILR Interrogation 11/16/2022 ILR summary report received. Battery status OK. Normal device function. No new symptom, tachy, brady, or pause episodes. No new AF episodes. Monthly summary reports and  ROV/PRN  LR      Echocardiogram 10/26/2013 Left ventricle: The cavity size was normal. There was moderate    concentric hypertrophy. Systolic function was normal. The    estimated ejection fraction was in the range of 55% to 60%. Wall    motion was normal; there were no regional wall motion    abnormalities. Doppler parameters are consistent with abnormal    left ventricular relaxation (grade 1 diastolic dysfunction). The    E/e&' ratio is between 8-15, suggesting indeterminate LV filling    pressure.  - Aortic valve: Poorly visualized. Trileaflet. There was trivial    regurgitation.  - Right atrium: The atrium was normal in size.   Assessment & Plan   1.  PAF: Continues on diltiazem diltiazem 240 mg daily.  She is no longer on any anticoagulation.  She is not on anticoagulation due to frequent GI bleeds.  ILR did not reveal any recurrence of A-fib.  Heart rate was irregular today may have been a PAC versus PVC.  No changes in her regimen.   2.  Hypertension:  Blood pressure is stable.  She continues on losartan 50 mg daily and diltiazem as directed.  I think her dizziness is related to UTI therefore I will not change anything.  Blood pressure is low normal today at 110/66.  3.  Probable UTI: Having dysuria, frequency, and incontinence.  She also feels very weak and lightheaded at times especially when changing positions.  She is going to follow-up with Dr. Clelia Croft her PCP today for labs and urinalysis with C/S.   4.  History of GI bleed with diverticulitis: Recent hospitalization.  Hemoglobin was less than 7, now up to 9.0 per recent labs.  She is to follow-up with GI.         Signed, Bettey Mare. Liborio Nixon, ANP, AACC   12/13/2022 2:39 PM      Office [phone] Fax [phone]  Notice: This dictation was prepared with Dragon dictation along with smaller phrase technology. Any transcriptional errors that result from this process are unintentional and may not be corrected upon review.

## 2022-12-13 ENCOUNTER — Ambulatory Visit: Payer: Medicare Other | Attending: Adult Health | Admitting: Adult Health

## 2022-12-13 ENCOUNTER — Encounter: Payer: Self-pay | Admitting: Adult Health

## 2022-12-13 VITALS — BP 110/66 | HR 91 | Ht 62.0 in | Wt 230.0 lb

## 2022-12-13 DIAGNOSIS — E785 Hyperlipidemia, unspecified: Secondary | ICD-10-CM | POA: Diagnosis not present

## 2022-12-13 DIAGNOSIS — I480 Paroxysmal atrial fibrillation: Secondary | ICD-10-CM

## 2022-12-13 DIAGNOSIS — K2901 Acute gastritis with bleeding: Secondary | ICD-10-CM | POA: Diagnosis not present

## 2022-12-13 DIAGNOSIS — N390 Urinary tract infection, site not specified: Secondary | ICD-10-CM | POA: Diagnosis not present

## 2022-12-13 DIAGNOSIS — N3000 Acute cystitis without hematuria: Secondary | ICD-10-CM | POA: Diagnosis not present

## 2022-12-13 DIAGNOSIS — I10 Essential (primary) hypertension: Secondary | ICD-10-CM | POA: Diagnosis not present

## 2022-12-13 NOTE — Patient Instructions (Signed)
Medication Instructions:  No Changes *If you need a refill on your cardiac medications before your next appointment, please call your pharmacy*   Lab Work: No labs If you have labs (blood work) drawn today and your tests are completely normal, you will receive your results only by: MyChart Message (if you have MyChart) OR A paper copy in the mail If you have any lab test that is abnormal or we need to change your treatment, we will call you to review the results.   Testing/Procedures: No Testing   Follow-Up: At [facility], you and your health needs are our priority.  As part of our continuing mission to provide you with exceptional heart care, we have created designated Provider Care Teams.  These Care Teams include your primary Cardiologist (physician) and Advanced Practice Providers (APPs -  Physician Assistants and Nurse Practitioners) who all work together to provide you with the care you need, when you need it.  We recommend signing up for the patient portal called "MyChart".  Sign up information is provided on this After Visit Summary.  MyChart is used to connect with patients for Virtual Visits (Telemedicine).  Patients are able to view lab/test results, encounter notes, upcoming appointments, etc.  Non-urgent messages can be sent to your provider as well.   To learn more about what you can do with MyChart, go to [email or web address].    Your next appointment:   Keep Scheduled Appointment  Provider:   Nanetta Batty, MD

## 2022-12-14 DIAGNOSIS — K5731 Diverticulosis of large intestine without perforation or abscess with bleeding: Secondary | ICD-10-CM | POA: Diagnosis not present

## 2022-12-14 DIAGNOSIS — G3184 Mild cognitive impairment, so stated: Secondary | ICD-10-CM | POA: Diagnosis not present

## 2022-12-14 DIAGNOSIS — I480 Paroxysmal atrial fibrillation: Secondary | ICD-10-CM | POA: Diagnosis not present

## 2022-12-14 DIAGNOSIS — N390 Urinary tract infection, site not specified: Secondary | ICD-10-CM | POA: Diagnosis not present

## 2022-12-14 DIAGNOSIS — K219 Gastro-esophageal reflux disease without esophagitis: Secondary | ICD-10-CM | POA: Diagnosis not present

## 2022-12-14 DIAGNOSIS — K760 Fatty (change of) liver, not elsewhere classified: Secondary | ICD-10-CM | POA: Diagnosis not present

## 2022-12-14 DIAGNOSIS — R319 Hematuria, unspecified: Secondary | ICD-10-CM | POA: Diagnosis not present

## 2022-12-14 DIAGNOSIS — K921 Melena: Secondary | ICD-10-CM | POA: Diagnosis not present

## 2022-12-14 DIAGNOSIS — L299 Pruritus, unspecified: Secondary | ICD-10-CM | POA: Diagnosis not present

## 2022-12-14 DIAGNOSIS — F418 Other specified anxiety disorders: Secondary | ICD-10-CM | POA: Diagnosis not present

## 2022-12-14 DIAGNOSIS — F3342 Major depressive disorder, recurrent, in full remission: Secondary | ICD-10-CM | POA: Diagnosis not present

## 2022-12-16 NOTE — Progress Notes (Signed)
Carelink Summary Report / Loop Recorder 

## 2022-12-22 DIAGNOSIS — I10 Essential (primary) hypertension: Secondary | ICD-10-CM | POA: Diagnosis not present

## 2022-12-22 DIAGNOSIS — G3184 Mild cognitive impairment, so stated: Secondary | ICD-10-CM | POA: Diagnosis not present

## 2022-12-22 DIAGNOSIS — Z1389 Encounter for screening for other disorder: Secondary | ICD-10-CM | POA: Diagnosis not present

## 2022-12-22 DIAGNOSIS — D649 Anemia, unspecified: Secondary | ICD-10-CM | POA: Diagnosis not present

## 2022-12-22 DIAGNOSIS — L299 Pruritus, unspecified: Secondary | ICD-10-CM | POA: Diagnosis not present

## 2022-12-27 DIAGNOSIS — L299 Pruritus, unspecified: Secondary | ICD-10-CM | POA: Diagnosis not present

## 2022-12-27 DIAGNOSIS — R0609 Other forms of dyspnea: Secondary | ICD-10-CM | POA: Diagnosis not present

## 2022-12-27 DIAGNOSIS — D500 Iron deficiency anemia secondary to blood loss (chronic): Secondary | ICD-10-CM | POA: Diagnosis not present

## 2022-12-30 ENCOUNTER — Ambulatory Visit (INDEPENDENT_AMBULATORY_CARE_PROVIDER_SITE_OTHER): Payer: Medicare Other | Admitting: Physician Assistant

## 2022-12-30 ENCOUNTER — Encounter: Payer: Self-pay | Admitting: Physician Assistant

## 2022-12-30 VITALS — BP 102/78 | HR 93 | Resp 18 | Ht 62.0 in

## 2022-12-30 DIAGNOSIS — R413 Other amnesia: Secondary | ICD-10-CM | POA: Diagnosis not present

## 2022-12-30 NOTE — Progress Notes (Signed)
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 RH female with a history of hypertension, hyperlipidemia, anxiety, depression in remission, seen today in follow up to discuss the MRI of the brain results from 11/28/2022. These were personally reviewed, remarkable for chronic small vessel ischemic changes, mild in the cerebral white matter, moderate in the pons, progressed from prior MRI in 2016, as well as mild generalized cerebral atrophy.  There are no acute findings. Patient was prescribed memantine 5 mg daily, initially tolerating it well.  However, recently, when the patient was hospitalized for diverticulitis and acute blood loss, memantine was placed on hold, and given her symptomatic iron deficiency anemia/recent ABL, did not likely recommend to reinitiated as of today until symptoms improve.  She is able to participate on her ADLs, continues to drive without difficulties.  This patient is accompanied in the office by her husband who supplements the history.  Previous records as well as any outside records available were reviewed prior to todays visit.     Follow up in 6  months. Resume memantine 5 mg daily when symptoms of anemia-recent ABL improve .  Side effects discussed.   Patient is scheduled for neuropsych evaluation in the near future for clarity of the diagnosis and to rule out other causes of memory loss such as sleep, anxiety or depression. Continue B12 supplements. Recommend good control of cardiovascular risk factors Continue to control mood as per PCP     Initial visit 11/12/2022 How long did patient have memory difficulties? For the last year.  Sometimes she forgets the times of her appointments or names  repeats oneself?  Endorsed Disoriented when walking into a room?  Patient denies   Leaving objects in unusual places? denies   Wandering behavior?  denies   Any personality changes?  Patient denies   Any  history of depression?:  Patient denies any recent depression, it is on remission  Hallucinations or paranoia?  Patient denies   Seizures?   Patient denies    Any sleep changes?   Sleeps well " most of the time" Occasional vivid dreams, denies REM behavior or sleepwalking   Sleep apnea?  Patient denies   Any hygiene concerns?  Patient denies   Independent of bathing and dressing?  Endorsed  Does the patient needs help with medications? Husband is in charge   Who is in charge of the finances? We have separate accounts but husband is in charge     Any changes in appetite?  Denies      Patient have trouble swallowing? Denies    Does the patient cook?  Yes "sometimes she forgets ingredients, which frustrates her"-husband says.   Any kitchen accidents such as leaving the stove on? denies   Any headaches?   Denies    Chronic pain ?  arthritis     Ambulates with difficulty?  Started balance therapy and likes like it   Recent falls or head injuries? Denies    Vision changes? denies   Unilateral weakness, numbness or tingling? Denies    Any tremors?   Denies    Any anosmia?  Denies    Any incontinence of urine?  Endorsed. Any bowel dysfunction? Yes "it goes back and forth, mostly diarrhea". She has a history of IBS  Patient lives with her husband History of heavy alcohol intake? Denies.   History of heavy tobacco use? Denies.   Family history of dementia? "I don't  really know".   Does patient drive?  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     CURRENT MEDICATIONS:  Outpatient Encounter Medications as of 12/30/2022  Medication Sig   acetaminophen (TYLENOL) 500 MG tablet Take 1,000 mg by mouth every 6 (six) hours as needed (for pain.).    albuterol (PROVENTIL HFA;VENTOLIN HFA) 108 (90 Base) MCG/ACT inhaler Inhale 2 puffs into the lungs every 6 (six) hours as needed for shortness of breath or wheezing.   ALPRAZolam (XANAX) 0.25 MG tablet Take 0.25  mg by mouth daily as needed for anxiety.   BIOTIN PO Take 1 tablet by mouth daily.   Cholecalciferol (DIALYVITE VITAMIN D 5000) 125 MCG (5000 UT) capsule Take 5,000 Units by mouth 2 (two) times daily.   colestipol (COLESTID) 1 g tablet Take once daily and titrate as needed   cyclobenzaprine (FLEXERIL) 10 MG tablet Take 10 mg by mouth 3 (three) times daily as needed for muscle spasms.   diltiazem (CARDIZEM CD) 240 MG 24 hr capsule TAKE 1 CAPSULE BY MOUTH EVERY DAY (Patient taking differently: Take 240 mg by mouth daily.)   DULoxetine (CYMBALTA) 60 MG capsule SMARTSIG:1 Capsule(s) By Mouth Every Evening   hydrOXYzine (ATARAX) 25 MG tablet 1 tablet Orally as needed every 8hrs for 30 days   loratadine (CLARITIN) 10 MG tablet Take 10 mg by mouth daily.   losartan (COZAAR) 50 MG tablet Take 50 mg by mouth daily.   Melatonin 10 MG TABS Take 10 mg by mouth at bedtime.   memantine (NAMENDA) 5 MG tablet TAKE 1 TABLET BY MOUTH EVERYDAY AT BEDTIME   Multiple Vitamin (MULTIVITAMIN WITH MINERALS) TABS tablet Take 1 tablet by mouth daily. Centrum   ondansetron (ZOFRAN-ODT) 8 MG disintegrating tablet DISSOLVE 1 TABLET IN MOUTH EVERY 8 HRS AS NEEDED NAUSEA (Patient taking differently: Take 8 mg by mouth as needed.)   pantoprazole (PROTONIX) 40 MG tablet Take 1 tablet (40 mg total) by mouth 2 (two) times daily before a meal.   simvastatin (ZOCOR) 20 MG tablet Take 1 tablet (20 mg total) by mouth daily.   vitamin B-12 (CYANOCOBALAMIN) 1000 MCG tablet Take 1,000 mcg by mouth daily.   No facility-administered encounter medications on file as of 12/30/2022.        No data to display            11/12/2022    1:00 PM  Montreal Cognitive Assessment   Visuospatial/ Executive (0/5) 1  Naming (0/3) 3  Attention: Read list of digits (0/2) 2  Attention: Read list of letters (0/1) 1  Attention: Serial 7 subtraction starting at 100 (0/3) 1  Language: Repeat phrase (0/2) 2  Language : Fluency (0/1) 1   Abstraction (0/2) 0  Delayed Recall (0/5) 0  Orientation (0/6) 6  Total 17  Adjusted Score (based on education) 18   Thank you for allowing [place] the opportunity to participate in the care of this nice patient. Please do not hesitate to contact us for any questions or concerns.   Total time spent on today's visit was 30 minutes dedicated to this patient today, preparing to see patient, examining the patient, ordering tests and/or medications and counseling the patient, documenting clinical information in the EHR or other health record, independently interpreting results and communicating results to the patient/family, discussing treatment and goals, answering patient's questions and coordinating care.  Cc:  Cleatis Polka., MD  Marlowe Kays 12/30/2022 2:28 PM

## 2022-12-30 NOTE — Patient Instructions (Addendum)
It was a pleasure to see you today at our office.   Recommendations:  Neurocognitive evaluation at our office    Start Memantine 5 mg at night when  you are back to normal Continue B12  Take care of the iron deficiency anemia    For psychiatric meds, mood meds: Please have your primary care physician manage these medications.  If you have any severe symptoms of a stroke, or other severe issues such as confusion,severe chills or fever, etc call 911 or go to the ER as you may need to be evaluated further    For assessment of decision of mental capacity and competency:  Call Dr. Erick Blinks, geriatric psychiatrist at [phone]  Counseling regarding caregiver distress, including caregiver depression, anxiety and issues regarding community resources, adult day care programs, adult living facilities, or memory care questions:  please contact your  Primary Doctor's Social Worker   Whom to call: Memory  decline, memory medications: Call our office [phone]    [email or web address]   RECOMMENDATIONS FOR ALL PATIENTS WITH MEMORY PROBLEMS: 1. Continue to exercise (Recommend 30 minutes of walking everyday, or 3 hours every week) 2. Increase social interactions - continue going to [place] and enjoy social gatherings with friends and family 3. Eat healthy, avoid fried foods and eat more fruits and vegetables 4. Maintain adequate blood pressure, blood sugar, and blood cholesterol level. Reducing the risk of stroke and cardiovascular disease also helps promoting better memory. 5. Avoid stressful situations. Live a simple life and avoid aggravations. Organize your time and prepare for the next day in anticipation. 6. Sleep well, avoid any interruptions of sleep and avoid any distractions in the bedroom that may interfere with adequate sleep quality 7. Avoid sugar, avoid sweets as there is a strong link between excessive sugar intake,  diabetes, and cognitive impairment We discussed the Mediterranean diet, which has been shown to help patients reduce the risk of progressive memory disorders and reduces cardiovascular risk. This includes eating fish, eat fruits and green leafy vegetables, nuts like almonds and hazelnuts, walnuts, and also use olive oil. Avoid fast foods and fried foods as much as possible. Avoid sweets and sugar as sugar use has been linked to worsening of memory function.  There is always a concern of gradual progression of memory problems. If this is the case, then we may need to adjust level of care according to patient needs. Support, both to the patient and caregiver, should then be put into place.      You have been referred for a neuropsychological evaluation (i.e., evaluation of memory and thinking abilities). Please bring someone with you to this appointment if possible, as it is helpful for the doctor to hear from both you and another adult who knows you well. Please bring eyeglasses and hearing aids if you wear them.    The evaluation will take approximately 3 hours and has two parts:   The first part is a clinical interview with the neuropsychologist (Dr. Milbert Coulter or Dr. Roseanne Reno). During the interview, the neuropsychologist will speak with you and the individual you brought to the appointment.    The second part of the evaluation is testing with the doctor's technician Annabelle Harman or Selena Batten). During the testing, the technician will ask you to remember different types of material, solve problems, and answer some questionnaires. Your family member will not be present for this portion of the evaluation.   Please note: We must reserve several hours of the neuropsychologist's time and the psychometrician's  time for your evaluation appointment. As such, there is a No-Show fee of $100. If you are unable to attend any of your appointments, please contact our office as soon as possible to reschedule.      DRIVING:  Regarding driving, in patients with progressive memory problems, driving will be impaired. We advise to have someone else do the driving if trouble finding directions or if minor accidents are reported. Independent driving assessment is available to determine safety of driving.   If you are interested in the driving assessment, you can contact the following:  [facility] [employer] [facility] in [place] [phone]  [facility] [phone]  [facility] [phone]  [facility] [phone] or [phone]   FALL PRECAUTIONS: Be cautious when walking. Scan the area for obstacles that may increase the risk of trips and falls. When getting up in the mornings, sit up at the edge of the bed for a few minutes before getting out of bed. Consider elevating the bed at the head end to avoid drop of blood pressure when getting up. Walk always in a well-lit room (use night lights in the walls). Avoid area rugs or power cords from appliances in the middle of the walkways. Use a walker or a cane if necessary and consider physical therapy for balance exercise. Get your eyesight checked regularly.  FINANCIAL OVERSIGHT: Supervision, especially oversight when making financial decisions or transactions is also recommended.  HOME SAFETY: Consider the safety of the kitchen when operating appliances like stoves, microwave oven, and blender. Consider having supervision and share cooking responsibilities until no longer able to participate in those. Accidents with firearms and other hazards in the house should be identified and addressed as well.   ABILITY TO BE LEFT ALONE: If patient is unable to contact 911 operator, consider using LifeLine, or when the need is there, arrange for someone to stay with patients. Smoking is a fire hazard, consider supervision or cessation. Risk of wandering should be assessed by caregiver and if detected at any point, supervision and safe proof  recommendations should be instituted.  MEDICATION SUPERVISION: Inability to self-administer medication needs to be constantly addressed. Implement a mechanism to ensure safe administration of the medications.      Mediterranean Diet A Mediterranean diet refers to food and lifestyle choices that are based on the traditions of countries located on the [employer] [place]. This way of eating has been shown to help prevent certain conditions and improve outcomes for people who have chronic diseases, like kidney disease and heart disease. What are tips for following this plan? Lifestyle  Cook and eat meals together with your family, when possible. Drink enough fluid to keep your urine clear or pale yellow. Be physically active every day. This includes: Aerobic exercise like running or swimming. Leisure activities like gardening, walking, or housework. Get 7-8 hours of sleep each night. If recommended by your health care provider, drink red wine in moderation. This means 1 glass a day for nonpregnant women and 2 glasses a day for men. A glass of wine equals 5 oz (150 mL). Reading food labels  Check the serving size of packaged foods. For foods such as rice and pasta, the serving size refers to the amount of cooked product, not dry. Check the total fat in packaged foods. Avoid foods that have saturated fat or trans fats. Check the ingredients list for added sugars, such as corn syrup. Shopping  At the grocery store, buy most of your food from the areas near  the walls of the store. This includes: Fresh fruits and vegetables (produce). Grains, beans, nuts, and seeds. Some of these may be available in unpackaged forms or large amounts (in bulk). Fresh seafood. Poultry and eggs. Low-fat dairy products. Buy whole ingredients instead of prepackaged foods. Buy fresh fruits and vegetables in-season from local farmers markets. Buy frozen fruits and vegetables in resealable bags. If you do not have  access to quality fresh seafood, buy precooked frozen shrimp or canned fish, such as tuna, salmon, or sardines. Buy small amounts of raw or cooked vegetables, salads, or olives from the deli or salad bar at your store. Stock your pantry so you always have certain foods on hand, such as olive oil, canned tuna, canned tomatoes, rice, pasta, and beans. Cooking  Cook foods with extra-virgin olive oil instead of using butter or other vegetable oils. Have meat as a side dish, and have vegetables or grains as your main dish. This means having meat in small portions or adding small amounts of meat to foods like pasta or stew. Use beans or vegetables instead of meat in common dishes like chili or lasagna. Experiment with different cooking methods. Try roasting or broiling vegetables instead of steaming or sauteing them. Add frozen vegetables to soups, stews, pasta, or rice. Add nuts or seeds for added healthy fat at each meal. You can add these to yogurt, salads, or vegetable dishes. Marinate fish or vegetables using olive oil, lemon juice, garlic, and fresh herbs. Meal planning  Plan to eat 1 vegetarian meal one day each week. Try to work up to 2 vegetarian meals, if possible. Eat seafood 2 or more times a week. Have healthy snacks readily available, such as: Vegetable sticks with hummus. Greek yogurt. Fruit and nut trail mix. Eat balanced meals throughout the week. This includes: Fruit: 2-3 servings a day Vegetables: 4-5 servings a day Low-fat dairy: 2 servings a day Fish, poultry, or lean meat: 1 serving a day Beans and legumes: 2 or more servings a week Nuts and seeds: 1-2 servings a day Whole grains: 6-8 servings a day Extra-virgin olive oil: 3-4 servings a day Limit red meat and sweets to only a few servings a month What are my food choices? Mediterranean diet Recommended Grains: Whole-grain pasta. Brown rice. Bulgar wheat. Polenta. Couscous. Whole-wheat bread. Orpah Cobb. Vegetables: Artichokes. Beets. Broccoli. Cabbage. Carrots. Eggplant. Green beans. Chard. Kale. Spinach. Onions. Leeks. Peas. Squash. Tomatoes. Peppers. Radishes. Fruits: Apples. Apricots. Avocado. Berries. Bananas. Cherries. Dates. Figs. Grapes. Lemons. Melon. Oranges. Peaches. Plums. Pomegranate. Meats and other protein foods: Beans. Almonds. Sunflower seeds. Pine nuts. Peanuts. Cod. Salmon. Scallops. Shrimp. Tuna. Tilapia. Clams. Oysters. Eggs. Dairy: Low-fat milk. Cheese. Greek yogurt. Beverages: Water. Red wine. Herbal tea. Fats and oils: Extra virgin olive oil. Avocado oil. Grape seed oil. Sweets and desserts: [place] yogurt with honey. Baked apples. Poached pears. Trail mix. Seasoning and other foods: Basil. Cilantro. Coriander. Cumin. Mint. Parsley. Sage. Rosemary. Tarragon. Garlic. Oregano. Thyme. Pepper. Balsalmic vinegar. Tahini. Hummus. Tomato sauce. Olives. Mushrooms. Limit these Grains: Prepackaged pasta or rice dishes. Prepackaged cereal with added sugar. Vegetables: Deep fried potatoes (french fries). Fruits: Fruit canned in syrup. Meats and other protein foods: Beef. Pork. Lamb. Poultry with skin. Hot dogs. Tomasa Blase. Dairy: Ice cream. Sour cream. Whole milk. Beverages: Juice. Sugar-sweetened soft drinks. Beer. Liquor and spirits. Fats and oils: Butter. Canola oil. Vegetable oil. Beef fat (tallow). Lard. Sweets and desserts: Cookies. Cakes. Pies. Candy. Seasoning and other foods: Mayonnaise. Premade sauces and marinades. The  items listed may not be a complete list. Talk with your dietitian about what dietary choices are right for you. Summary The Mediterranean diet includes both food and lifestyle choices. Eat a variety of fresh fruits and vegetables, beans, nuts, seeds, and whole grains. Limit the amount of red meat and sweets that you eat. Talk with your health care provider about whether it is safe for you to drink red wine in moderation. This means 1 glass a day for  nonpregnant women and 2 glasses a day for men. A glass of wine equals 5 oz (150 mL). This information is not intended to replace advice given to you by your health care provider. Make sure you discuss any questions you have with your health care provider. Document Released: 10/30/2015 Document Revised: 12/02/2015 Document Reviewed: 10/30/2015 Elsevier Interactive Patient Education  2017 [employer].

## 2023-01-03 ENCOUNTER — Ambulatory Visit (INDEPENDENT_AMBULATORY_CARE_PROVIDER_SITE_OTHER): Payer: Medicare Other

## 2023-01-03 DIAGNOSIS — I480 Paroxysmal atrial fibrillation: Secondary | ICD-10-CM

## 2023-01-03 LAB — CUP PACEART REMOTE DEVICE CHECK
Date Time Interrogation Session: 20241013230255
Implantable Pulse Generator Implant Date: 20211103

## 2023-01-06 ENCOUNTER — Other Ambulatory Visit (HOSPITAL_COMMUNITY): Payer: Self-pay

## 2023-01-07 ENCOUNTER — Encounter (HOSPITAL_COMMUNITY)
Admission: RE | Admit: 2023-01-07 | Discharge: 2023-01-07 | Disposition: A | Discharge status: Home / Self Care | Payer: Medicare Other | Source: Ambulatory Visit | Attending: Internal Medicine | Admitting: Internal Medicine

## 2023-01-07 DIAGNOSIS — D649 Anemia, unspecified: Secondary | ICD-10-CM | POA: Insufficient documentation

## 2023-01-07 MED ORDER — SODIUM CHLORIDE 0.9 % IV SOLN
510.0000 mg | INTRAVENOUS | Status: DC
  Administered 2023-01-07: 510 mg via INTRAVENOUS
  Filled 2023-01-07: qty 510

## 2023-01-14 ENCOUNTER — Encounter (HOSPITAL_COMMUNITY)
Admission: RE | Admit: 2023-01-14 | Discharge: 2023-01-14 | Disposition: A | Discharge status: Home / Self Care | Payer: Medicare Other | Source: Ambulatory Visit | Attending: Internal Medicine | Admitting: Internal Medicine

## 2023-01-14 DIAGNOSIS — D649 Anemia, unspecified: Secondary | ICD-10-CM | POA: Diagnosis not present

## 2023-01-14 MED ORDER — SODIUM CHLORIDE 0.9 % IV SOLN
510.0000 mg | INTRAVENOUS | Status: DC
  Administered 2023-01-14: 510 mg via INTRAVENOUS
  Filled 2023-01-14: qty 510

## 2023-01-17 ENCOUNTER — Telehealth: Payer: Self-pay | Admitting: Physician Assistant

## 2023-01-17 NOTE — Telephone Encounter (Signed)
Doree Albee, PA-C  You29 minutes ago (3:33 PM)   [facility] Suggest going ahead and going to the ER for pains 8 out of 10 and she has been having black stools.   Spoke with pt and she is aware of recommendations per Quentin Mulling PA. Pt states her husband is not at home but she will try and go this evening.

## 2023-01-17 NOTE — Telephone Encounter (Signed)
Without severe of a pain and passing dark stool would suggest going to the ER to rule out gastric ulcer.

## 2023-01-17 NOTE — Telephone Encounter (Signed)
Pt states she passed a few black rock looking stool that was kin slimy yellow material.    States she is hurting a lot on her RUQ under her ribs that goes down to her back. Rates pain conservatively at an 8 or 9.She has no gallbladder or appendix. Describes the pain as a pulling grabbing pain. She is taking ES tylenol for pain. Pt states the pain is bad enough she may go to the ER. Please advise.

## 2023-01-17 NOTE — Telephone Encounter (Signed)
Pt states she passed a few black rock looking stool that was kin slimy yellow material.

## 2023-01-17 NOTE — Telephone Encounter (Signed)
Patient called stated she has not  yet received a call, advised another message would be sent to the nurse. She then said she will be going to the hospital if she doesn't hear from the office within an hour.

## 2023-01-17 NOTE — Telephone Encounter (Signed)
Patient called states she is having right lower rib area discomfort and pain. Please Advise.

## 2023-01-19 NOTE — Progress Notes (Signed)
Carelink Summary Report / Loop Recorder 

## 2023-01-21 ENCOUNTER — Other Ambulatory Visit: Payer: Self-pay | Admitting: Gastroenterology

## 2023-01-28 ENCOUNTER — Telehealth: Payer: Self-pay | Admitting: Gastroenterology

## 2023-01-28 NOTE — Telephone Encounter (Signed)
Inbound call from patient stating she received a call regarding sooner appointment with Dr. Adela Lank. Patient unsure who called and stated the number left on voicemail was incorrect. Please advise, thank you,

## 2023-01-28 NOTE — Telephone Encounter (Signed)
Spoke to patient who states she was in the hospital 11/2022 for GI bleed; having continued episodic abdominal pain, change in bowels (alternating between constipation/diarrhea). Also notes some dark stools, although now on iron supplementation. Per hospital note, pt was to have f/u endo/colon in about 1 month.   Patient has been scheduled to see Alcide Evener, NP on 01/31/23 in follow up. She verbalizes understanding.  Patient is reminded that if she has severe or worsening abdominal pain (as she called about 01/17/23) over the weekend or passage of black, tarry stools, she should seek emergent evaluation. She also verbalizes understanding of this information.

## 2023-01-31 ENCOUNTER — Other Ambulatory Visit (INDEPENDENT_AMBULATORY_CARE_PROVIDER_SITE_OTHER): Payer: Medicare Other

## 2023-01-31 ENCOUNTER — Encounter: Payer: Self-pay | Admitting: Nurse Practitioner

## 2023-01-31 ENCOUNTER — Ambulatory Visit (INDEPENDENT_AMBULATORY_CARE_PROVIDER_SITE_OTHER): Payer: Medicare Other | Admitting: Nurse Practitioner

## 2023-01-31 VITALS — BP 124/80 | HR 105 | Ht 62.0 in | Wt 228.0 lb

## 2023-01-31 DIAGNOSIS — D649 Anemia, unspecified: Secondary | ICD-10-CM | POA: Diagnosis not present

## 2023-01-31 DIAGNOSIS — K922 Gastrointestinal hemorrhage, unspecified: Secondary | ICD-10-CM | POA: Diagnosis not present

## 2023-01-31 DIAGNOSIS — R112 Nausea with vomiting, unspecified: Secondary | ICD-10-CM

## 2023-01-31 LAB — IBC + FERRITIN
Ferritin: 206.7 ng/mL (ref 10.0–291.0)
Iron: 135 ug/dL (ref 42–145)
Saturation Ratios: 39 % (ref 20.0–50.0)
TIBC: 345.8 ug/dL (ref 250.0–450.0)
Transferrin: 247 mg/dL (ref 212.0–360.0)

## 2023-01-31 LAB — COMPREHENSIVE METABOLIC PANEL
ALT: 16 U/L (ref 0–35)
AST: 21 U/L (ref 0–37)
Albumin: 4 g/dL (ref 3.5–5.2)
Alkaline Phosphatase: 61 U/L (ref 39–117)
BUN: 17 mg/dL (ref 6–23)
CO2: 27 meq/L (ref 19–32)
Calcium: 9 mg/dL (ref 8.4–10.5)
Chloride: 105 meq/L (ref 96–112)
Creatinine, Ser: 1.26 mg/dL — ABNORMAL HIGH (ref 0.40–1.20)
GFR: 41.48 mL/min — ABNORMAL LOW (ref >60.00)
Glucose, Bld: 107 mg/dL — ABNORMAL HIGH (ref 70–99)
Potassium: 4 meq/L (ref 3.5–5.1)
Sodium: 141 meq/L (ref 135–145)
Total Bilirubin: 0.4 mg/dL (ref 0.2–1.2)
Total Protein: 6.3 g/dL (ref 6.0–8.3)

## 2023-01-31 LAB — CBC WITH DIFFERENTIAL/PLATELET
Basophils Absolute: 0 10*3/uL (ref 0.0–0.1)
Basophils Relative: 0.5 % (ref 0.0–3.0)
Eosinophils Absolute: 0 10*3/uL (ref 0.0–0.7)
Eosinophils Relative: 0.3 % (ref 0.0–5.0)
HCT: 37.5 % (ref 36.0–46.0)
Hemoglobin: 12 g/dL (ref 12.0–15.0)
Lymphocytes Relative: 22.6 % (ref 12.0–46.0)
Lymphs Abs: 1.4 10*3/uL (ref 0.7–4.0)
MCHC: 32 g/dL (ref 30.0–36.0)
MCV: 92.8 fL (ref 78.0–100.0)
Monocytes Absolute: 0.5 10*3/uL (ref 0.1–1.0)
Monocytes Relative: 7.9 % (ref 3.0–12.0)
Neutro Abs: 4.1 10*3/uL (ref 1.4–7.7)
Neutrophils Relative %: 68.7 % (ref 43.0–77.0)
Platelets: 190 10*3/uL (ref 150.0–400.0)
RBC: 4.04 Mil/uL (ref 3.87–5.11)
RDW: 21.4 % — ABNORMAL HIGH (ref 11.5–15.5)
WBC: 6 10*3/uL (ref 4.0–10.5)

## 2023-01-31 LAB — B12 AND FOLATE PANEL
Folate: >24.2 ng/mL (ref >5.9)
Vitamin B-12: >1537 pg/mL — ABNORMAL HIGH (ref 211–911)

## 2023-01-31 NOTE — Progress Notes (Unsigned)
01/31/2023 [name] [id number] [birth year]   Chief Complaint:  History of Present Illness: [name] is a 77 year old female with a past medical history of anxiety, depression, arthritis, hypertension, hyperlipidemia, atrial fibrillation, sleep apnea, hepatic steatosis, GERD, colon polyps, diverticulosis and GI bleed 2021. Past cholecystectomy and gastric sleeve surgery 2016.   She was admitted to the hospital 11/30/2022 with RUQ pain which radiated to the flank area with significant bright red and dark red hematochezia. Admission Hg 11.8. Abdominal/pelvic CTA 9/10 was negative for active GI bleeding.  Hemoglobin 12 dropped to 9.  Tagged red blood cell scan 9/13 was negative for active GI bleeding.  Hemoglobin 8.5 at time of discharge 12/05/2022.  Some days are awful. Same right sided pain. Not stabbing, just hurts. Comes and goes, not constant, always aware of it. Sometimes more in front and sometimes more in the back.   Always has diarrhea x since before 2021 colonoscopy . Diarrhea once daily. Ate muffin today 4 times. Mucous with stool. No  bloody mucous. Pebbles of stool on TP. On iron. Dark Green or black on TP.  Solid stool once every few weeks.  Pukey if she goes too long without eating, takes pukey pills. Vomits up Green and yellow, watery, no CGE or red blood. Occurs daily chronic. Vomits more past few months.   Lays down at night, difficulty swallowing saliva. Voice hoarseness PPI BID  Afib lasts colonoscopy loop recorder     Latest Ref Rng & Units 12/09/2022    1:13 PM 12/05/2022    7:54 AM 12/04/2022    7:43 AM  CBC  WBC 4.0 - 10.5 K/uL 5.9  4.4  5.2   Hemoglobin 12.0 - 15.0 g/dL 9.0  8.5  9.1   Hematocrit 36.0 - 46.0 % 28.3  26.8  28.3   Platelets 150.0 - 400.0 K/uL 261.0  168  179         Latest Ref Rng & Units 12/03/2022    5:35 PM 12/02/2022    5:47 AM 12/01/2022    8:43 AM  CMP  Glucose 70 - 99 mg/dL 95  010  272   BUN 8 - 23 mg/dL 10  8  15     Creatinine 0.44 - 1.00 mg/dL 5.36  6.44  0.34   Sodium 135 - 145 mmol/L 138  140  138   Potassium 3.5 - 5.1 mmol/L 3.2  3.6  4.0   Chloride 98 - 111 mmol/L 106  110  107   CO2 22 - 32 mmol/L 20  24  22    Calcium 8.9 - 10.3 mg/dL 8.5  8.0  8.0   Total Protein 6.5 - 8.1 g/dL  4.7    Total Bilirubin 0.3 - 1.2 mg/dL  0.3    Alkaline Phos 38 - 126 U/L  46    AST 15 - 41 U/L  20    ALT 0 - 44 U/L  18      Tagged RBC scan 12/03/2022: FINDINGS: No abnormal areas of increased radiotracer uptake identified to indicate active GI bleeding. Normal physiologic distribution of the radiopharmaceutical is noted within the blood pool, liver, spleen and urinary bladder.   IMPRESSION: Examination is negative for active GI bleeding  RUQ sono 11/30/2021:  Gallbladder: Green cholecystectomy.   Common bile duct: Diameter: 14 mm   Liver: Left lobe of the liver is difficult to visualize. Within limitations, no focal lesion identified. Increased parenchymal echogenicity. Portal vein is patent  on color Doppler imaging with normal direction of blood flow towards the liver.   IMPRESSION: 1. Dilated common bile duct, unchanged when compared with the Green CTs and likely due to post cholecystectomy state. 2. Hepatic steatosis.  CTA 11/30/2022:  FINDINGS: VASCULAR   Aorta: Moderate partially calcified atheromatous plaque without aneurysm, dissection, for for   Celiac: Calcified ostial plaque without significant stenosis, patent distally.   SMA: Eccentric short-segment calcified ostial plaque without high-grade stenosis, patent distally with classic branch anatomy.   Renals: Single bilaterally, both with calcified ostial plaque resulting in mild stenosis, patent distally.   IMA: Patent without evidence of aneurysm, dissection, vasculitis or significant stenosis.   Inflow: Mild scattered calcified plaque in bilateral common and internal iliac arteries. No aneurysm or stenosis. External  iliac arteries normal.   Proximal Outflow: Bilateral common femoral and visualized portions of the superficial and profunda femoral arteries are patent without evidence of aneurysm, dissection, vasculitis or significant stenosis.   Veins: Patent hepatic veins, portal vein, SMV, splenic vein, bilateral renal veins, iliac venous system and IVC.   Review of the MIP images confirms the above findings.   NON-VASCULAR   Lower chest: No pleural or pericardial effusion. Visualized lung bases clear.   Hepatobiliary: No focal liver abnormality is seen. Status post cholecystectomy. Ectatic central intrahepatic biliary tree and CBD, stable.   Pancreas: Unremarkable. No pancreatic ductal dilatation or surrounding inflammatory changes.   Spleen: Normal in size without focal abnormality.   Adrenals/Urinary Tract: Chronic linear right adrenal calcifications without mass. No urolithiasis. No hydronephrosis. Stable 1.5 cm left upper pole cyst; no follow-up recommended. Urinary bladder physiologically distended.   Stomach/Bowel: No active GI bleed. Moderate hiatal hernia. Post gastric bypass surgery. Small bowel decompressed. Appendix not identified. The colon is partially distended with multiple scattered diverticula throughout, most numerous in the distal descending and sigmoid segments, without adjacent inflammatory change.   Lymphatic: No abdominal or pelvic adenopathy.   Reproductive: Status post hysterectomy. No adnexal masses.   Other: No ascites.  No free air.   Musculoskeletal: Mild spondylitic changes throughout the visualized lower thoracic and lumbar spine. Bilateral hip arthroplasty components project in expected location. No fracture or worrisome bone lesion.   IMPRESSION: 1. No active GI bleed. 2. Colonic diverticulosis without acute inflammation. 3. Moderate hiatal hernia. 4. Aortic Atherosclerosis     MOST RECENT GI PROCEDURES:  Colonoscopy 10/18/2019 by Dr.  Adela Lank: - The examined portion of the ileum was normal.  - Diverticulosis in the entire examined colon. - Three 2 to 3 mm polyps in the transverse colon, removed with a cold biopsy forceps. Resected and retrieved.  - Internal hemorrhoids, small.  - The examination was otherwise normal. Brown stool throughout, no heme appreciated. Suspect the patient has had low grade stuttering diverticular bleed, hopefully resolved with holding Eliquis for the past 5 days.   Surgical [P], colon, transverse, polyps (3) - TUBULAR ADENOMA (5 OF 6 FRAGMENTS) - BENIGN COLONIC MUCOSA (1 OF 6 FRAGMENTS) - NO HIGH GRADE DYSPLASIA OR MALIGNANCY IDENTIFIED  Incidentally I found 3 very small polyps in your colon during this exam, 2 or 3 of them returned as adenomas, small little pre-cancerous polyps. I would normally consider a repeat colonoscopy in 5-7 years or so for this, however you will be in your late 34s to 77 years old at that time, and risks of the exam may outweigh the benefits. I don't feel strongly that you need another colonoscopy but you can see me at that  time to discuss it.   EGD 09/20/2019: - Esophagogastric landmarks identified.  - 3 cm hiatal hernia.  - Normal esophagus otherwise  - Empiric dilation performed at 17mm and 18mm as above  - A sleeve gastrectomy was found, characterized by healthy appearing mucosa.  - Normal duodenal bulb and second portion of the duodenum.  EGD 04/15/2017: - Esophagogastric landmarks identified.  - Normal esophagus - A sleeve gastrectomy was found, characterized by healthy appearing mucosa.  - Erythematous mucosa in the antrum.  - Normal duodenal bulb and second portion of the duodenum.  - Biopsies were taken with a cold forceps for Helicobacter pylori testing.  Colonoscopy 04/15/2017: - The examined portion of the ileum was normal.  - Diverticulosis in the entire examined colon.  - The examination was otherwise normal on direct and retroflexion views.  -  Biopsies were taken with a cold forceps from the right colon, left colon and transverse colon for evaluation of microscopic colitis.   Past Medical History:  Diagnosis Date   Allergy    Anemia    Antral polyp    benign   Anxiety    Arthritis    Asthma    B12 deficiency    Back pain    Chest pain    Constipation    Depression    Diverticulosis    Gallbladder problem    GERD (gastroesophageal reflux disease) 09/09/2011   History of transfusion    HTN (hypertension) 09/09/2011   Hyperlipemia 09/09/2011   Hypertension    IBS (irritable bowel syndrome)    Internal hemorrhoid    Joint pain    Obesity    Osteoarthritis    Paroxysmal atrial fibrillation (HCC)    Pneumonia    PONV (postoperative nausea and vomiting)    with big surgeries, none with endos etc, with colonoscopy- 5-6 years ago could not swallow    Shortness of breath    Sleep apnea    borderline per patient no cpap    SOB (shortness of breath) on exertion    Swallowing difficulty    Tachycardia    Unstable angina (HCC) 09/09/2011   Vitamin D deficiency    Vocal cord dysfunction 2013   swelling   Past Surgical History:  Procedure Laterality Date   ABDOMINAL HYSTERECTOMY  1988   ANKLE FUSION Right    x 2   BACK SURGERY  [phone]   3 ruptured discs, lower back   BILATERAL ANTERIOR TOTAL HIP ARTHROPLASTY Bilateral 11/12/2014   Procedure: BILATERAL ANTERIOR TOTAL HIP ARTHROPLASTY;  Surgeon: Durene Romans, MD;  Location: [facility];  Service: Orthopedics;  Laterality: Bilateral;   BRAVO PH STUDY [name] 03/06/2013   Procedure: BRAVO PH STUDY;  Surgeon: Louis Meckel, MD;  Location: [facility];  Service: Endoscopy;  Laterality: [name];   CARDIAC CATHETERIZATION  2013   NORMAL   CHOLECYSTECTOMY     COLONOSCOPY     ELBOW SURGERY Left    removed bone chip   ESOPHAGOGASTRODUODENOSCOPY [name] 03/06/2013   Procedure: ESOPHAGOGASTRODUODENOSCOPY (EGD);  Surgeon: Louis Meckel, MD;  Location: [facility];  Service:  Endoscopy;  Laterality: [name];   FOOT OSTEOTOMY W/ PLANTAR FASCIA RELEASE Left    ILIOTIBIAL BAND RELEASE Right 1997   implantable loop recorder placement  01/23/2020   Medtronic Reveal Linq model LNQ 22 (RLB [id number]) implantable loop recorder   JOINT REPLACEMENT     LAPAROSCOPIC GASTRIC SLEEVE RESECTION WITH HIATAL HERNIA REPAIR  08/20/2014   Procedure: LAPAROSCOPIC GASTRIC SLEEVE RESECTION WITH  HIATAL HERNIA  REPAIR AND  UPPER ENDOSCOPY;  Surgeon: Luretha Murphy, MD;  Location: [facility];  Service: General;;   LEFT HEART CATHETERIZATION WITH CORONARY ANGIOGRAM Bilateral 09/10/2011   Procedure: LEFT HEART CATHETERIZATION WITH CORONARY ANGIOGRAM;  Surgeon: Runell Gess, MD;  Location[facility] CATH LAB;  Service: Cardiovascular;  Laterality: Bilateral;   NASAL SINUS SURGERY  1997   REVERSE SHOULDER ARTHROPLASTY Right 05/14/2016   Procedure: RIGHT REVERSE SHOULDER ARTHROPLASTY;  Surgeon: Beverely Low, MD;  Location: [facility];  Service: Orthopedics;  Laterality: Right;   REVERSE SHOULDER ARTHROPLASTY Left 05/16/2020   Procedure: REVERSE SHOULDER ARTHROPLASTY;  Surgeon: Beverely Low, MD;  Location: [facility];  Service: Orthopedics;  Laterality: Left;  interscalene exparel   SHOULDER SURGERY Left    tfc wrist Left 1997   thumb replacement Bilateral    one 2009 and one 2014   TOTAL ANKLE REPLACEMENT Right    x 3   TOTAL ANKLE REPLACEMENT Left    TOTAL ANKLE REPLACEMENT     TOTAL HIP ARTHROPLASTY Bilateral    TOTAL KNEE ARTHROPLASTY Bilateral    left 2002, right 2012   TOTAL SHOULDER ARTHROPLASTY Right 05/14/2016   TRANSTHORACIC ECHOCARDIOGRAM  09/09/2011   MILD CONCENTRIC LVH. TRACE MITRAL REGURG. TRACE TR. MILD AORTIC REGURG.   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   UPPER GI ENDOSCOPY  08/20/2014   Procedure: UPPER GI ENDOSCOPY;  Surgeon: Luretha Murphy, MD;  Location: [facility];  Service: General;;   WRIST SURGERY Left 1997, 2009        Current Medications, Allergies, Past Medical History, Past  Surgical History, Family History and Social History were reviewed in [employer] electronic medical record.   Review of Systems:   Constitutional: Negative for fever, sweats, chills or weight loss.  Respiratory: Negative for shortness of breath.   Cardiovascular: Negative for chest pain, palpitations and leg swelling.  Gastrointestinal: See HPI.  Musculoskeletal: Negative for back pain or muscle aches.  Neurological: Negative for dizziness, headaches or paresthesias.    Physical Exam: There were no vitals taken for this visit. General: in no acute distress. Head: Normocephalic and atraumatic. Eyes: No scleral icterus. Conjunctiva pink . Ears: Normal auditory acuity. Mouth: Dentition intact. No ulcers or lesions.  Lungs: Clear throughout to auscultation. Heart: Regular rate and rhythm, no murmur. Abdomen: Soft, nontender and nondistended. No masses or hepatomegaly. Normal bowel sounds x 4 quadrants.  Rectal: Deferred.  Musculoskeletal: Symmetrical with no gross deformities. Extremities: No edema. Neurological: Alert oriented x 4. No focal deficits.  Psychological: Alert and cooperative. Normal mood and affect  Assessment and Recommendations:  77 year old female admitted to the hospital 11/30/2022 with hematochezia, likely diverticular bleed. -CBC, IBC + Ferritin   GERD, hiatal hernia -Continue Pantoprazole 40 mg twice daily  History of colon polyps   Atrial fibrillation CHADVASC >4-not on anticoagulation secondary to history of GI bleed. On Cardizem.

## 2023-01-31 NOTE — Patient Instructions (Addendum)
Your provider has requested that you go to the basement level for lab work before leaving today. Press "B" on the elevator. The lab is located at the first door on the left as you exit the elevator.  Contact our office if blood from the rectum recurs.  Go to the emergency room if you develop severe nausea and vomiting or severe abdominal pain.  Due to recent changes in healthcare laws, you may see the results of your imaging and laboratory studies on MyChart before your provider has had a chance to review them.  We understand that in some cases there may be results that are confusing or concerning to you. Not all laboratory results come back in the same time frame and the provider may be waiting for multiple results in order to interpret others.  Please give [place] 48 hours in order for your provider to thoroughly review all the results before contacting the office for clarification of your results.   Thank you for trusting me with your gastrointestinal care!   Alcide Evener, CRNP

## 2023-02-01 NOTE — Progress Notes (Signed)
Nursing staff covering for [name] and [name], pls contact patient and let her know Dr. Adela Lank reviewed her office visit yesterday and he did not recommend any endoscopic evaluation for now.  Patient to contact our office if she has recurrent blood from the rectum.  To consider a colonoscopy if she has recurrent hematochezia.  Refer to Dr. Lanetta Inch addendum, see if patient is willing to take cholestyramine 4 g packet mixed into 4 ounces of any clear liquid once daily for her diarrhea, not to take within 4 hours of any other medication. # 30, no refills.  Patient to stop if she develops constipation on cholestyramine.  Note, she did try Colestid in the past which resulted in constipation therefore was discontinued.  Please have her obtain Fodzyme supplement otc to facilitate her bowel function and to provide stomach discomfort.

## 2023-02-01 NOTE — Progress Notes (Signed)
Agree with assessment and plan as outlined.  Her pain is chronic and has been ongoing for years, as well as her loose stools.  I think the yield of putting her through repeated endoscopic procedures at this time is pretty low.  Suspect some baseline functional bowel disorder causing her symptoms.  She did have a GI bleed fairly recently that was very consistent with diverticular in etiology.  If she has repeated bleeding we can consider colonoscopy but again I think yield is going to be low right now.  She has been on Colestid in the past which treated her diarrhea but caused constipation.  We could try low-dose cholestyramine powder so she could titrate the dose a bit better if the loose stools are really bothering her.  Also may be reasonable to give her a trial of Fodzyme supplement OTC to see if that helps her bowel function and stomach discomfort otherwise.  Thanks

## 2023-02-03 ENCOUNTER — Telehealth: Payer: Self-pay

## 2023-02-03 NOTE — Telephone Encounter (Signed)
Spoke with patient regarding NP/MD recommendations. She has an appointment with her PCP in a few weeks & plans to discuss labs and medications further with him then. She would prefer to hold off on medication until seen, and will give [place] a call back if she decides to proceed with prescription.

## 2023-02-03 NOTE — Telephone Encounter (Signed)
Author: Arnaldo Natal, NP Service: Gastroenterology Author Type: Nurse Practitioner  Filed: 02/01/2023  1:23 PM Encounter Date: 01/31/2023 Status: Signed  Editor: Arnaldo Natal, NP (Nurse Practitioner)   Nursing staff covering for Jory Sims, pls contact patient and let her know Dr. Adela Lank reviewed her office visit yesterday and he did not recommend any endoscopic evaluation for now.  Patient to contact our office if she has recurrent blood from the rectum.  To consider a colonoscopy if she has recurrent hematochezia.   Refer to Dr. Lanetta Inch addendum, see if patient is willing to take cholestyramine 4 g packet mixed into 4 ounces of any clear liquid once daily for her diarrhea, not to take within 4 hours of any other medication. # 30, no refills.  Patient to stop if she develops constipation on cholestyramine.  Note, she did try Colestid in the past which resulted in constipation therefore was discontinued.  Please have her obtain Fodzyme supplement otc to facilitate her bowel function and to provide stomach discomfort.

## 2023-02-07 ENCOUNTER — Ambulatory Visit (INDEPENDENT_AMBULATORY_CARE_PROVIDER_SITE_OTHER): Payer: Medicare Other

## 2023-02-07 DIAGNOSIS — I480 Paroxysmal atrial fibrillation: Secondary | ICD-10-CM

## 2023-02-07 LAB — CUP PACEART REMOTE DEVICE CHECK
Date Time Interrogation Session: 20241117230913
Implantable Pulse Generator Implant Date: 20211103

## 2023-02-18 DIAGNOSIS — G319 Degenerative disease of nervous system, unspecified: Secondary | ICD-10-CM | POA: Diagnosis not present

## 2023-02-18 DIAGNOSIS — G3184 Mild cognitive impairment, so stated: Secondary | ICD-10-CM | POA: Diagnosis not present

## 2023-02-18 DIAGNOSIS — L299 Pruritus, unspecified: Secondary | ICD-10-CM | POA: Diagnosis not present

## 2023-02-18 DIAGNOSIS — Z23 Encounter for immunization: Secondary | ICD-10-CM | POA: Diagnosis not present

## 2023-02-18 DIAGNOSIS — D500 Iron deficiency anemia secondary to blood loss (chronic): Secondary | ICD-10-CM | POA: Diagnosis not present

## 2023-02-18 DIAGNOSIS — R7989 Other specified abnormal findings of blood chemistry: Secondary | ICD-10-CM | POA: Diagnosis not present

## 2023-02-18 DIAGNOSIS — M545 Low back pain, unspecified: Secondary | ICD-10-CM | POA: Diagnosis not present

## 2023-02-18 DIAGNOSIS — F3342 Major depressive disorder, recurrent, in full remission: Secondary | ICD-10-CM | POA: Diagnosis not present

## 2023-02-18 DIAGNOSIS — R0609 Other forms of dyspnea: Secondary | ICD-10-CM | POA: Diagnosis not present

## 2023-03-03 ENCOUNTER — Ambulatory Visit (INDEPENDENT_AMBULATORY_CARE_PROVIDER_SITE_OTHER): Payer: Self-pay | Admitting: Audiology

## 2023-03-03 DIAGNOSIS — Z719 Counseling, unspecified: Secondary | ICD-10-CM

## 2023-03-03 NOTE — Progress Notes (Signed)
Patient was seen today for a hearing aid check. She currently wears Deno Etienne P50-R hearing aids. She wears a dome on the right side and a slimtip on the left. Her left earmold was cracked in half. Replaced the mold with a vented dome and replaced both hearing aid wax filters. Will send her left earmold for repair once we have our [employer] contract in place. Otoscopy revealed clear canals bilaterally. Updated the software to reflect her most recent hearing test. Connected the hearing aids to the software and ran Armed forces logistics/support/administrative officer. Set her at 100% overall gain and turned Sound recover 2 towards distinction and clarity. She was happy with the volume, clarity, and balance between her ears. Instructed her to call if any concerns arise.  No charge for today's visit.  Conley Rolls Kaide Gage, AUD, CCC-A 03/03/23

## 2023-03-04 NOTE — Progress Notes (Signed)
Carelink Summary Report / Loop Recorder 

## 2023-03-07 ENCOUNTER — Telehealth: Payer: Self-pay | Admitting: Nurse Practitioner

## 2023-03-07 MED ORDER — PANTOPRAZOLE SODIUM 40 MG PO TBEC: 40.0000 mg | DELAYED_RELEASE_TABLET | Freq: Two times a day (BID) | ORAL | 3 refills | Status: DC

## 2023-03-07 NOTE — Telephone Encounter (Signed)
Rx sent patient was notified

## 2023-03-07 NOTE — Telephone Encounter (Signed)
Patient's husband called to request a refill on Protonix stated they only have enough for today.

## 2023-03-14 ENCOUNTER — Ambulatory Visit (INDEPENDENT_AMBULATORY_CARE_PROVIDER_SITE_OTHER): Payer: Medicare Other

## 2023-03-14 DIAGNOSIS — I480 Paroxysmal atrial fibrillation: Secondary | ICD-10-CM | POA: Diagnosis not present

## 2023-03-15 LAB — CUP PACEART REMOTE DEVICE CHECK
Date Time Interrogation Session: 20241222230442
Implantable Pulse Generator Implant Date: 20211103

## 2023-03-27 ENCOUNTER — Emergency Department (HOSPITAL_COMMUNITY)
Admission: EM | Admit: 2023-03-27 | Discharge: 2023-03-27 | Disposition: A | Discharge status: Home / Self Care | Payer: Medicare Other | Attending: Emergency Medicine | Admitting: Emergency Medicine

## 2023-03-27 ENCOUNTER — Emergency Department (HOSPITAL_COMMUNITY): Payer: Medicare Other

## 2023-03-27 DIAGNOSIS — K625 Hemorrhage of anus and rectum: Secondary | ICD-10-CM | POA: Diagnosis present

## 2023-03-27 DIAGNOSIS — I10 Essential (primary) hypertension: Secondary | ICD-10-CM | POA: Diagnosis not present

## 2023-03-27 DIAGNOSIS — K5730 Diverticulosis of large intestine without perforation or abscess without bleeding: Secondary | ICD-10-CM | POA: Diagnosis not present

## 2023-03-27 DIAGNOSIS — K922 Gastrointestinal hemorrhage, unspecified: Secondary | ICD-10-CM | POA: Diagnosis not present

## 2023-03-27 DIAGNOSIS — E2749 Other adrenocortical insufficiency: Secondary | ICD-10-CM | POA: Diagnosis not present

## 2023-03-27 DIAGNOSIS — Z79899 Other long term (current) drug therapy: Secondary | ICD-10-CM | POA: Insufficient documentation

## 2023-03-27 DIAGNOSIS — N281 Cyst of kidney, acquired: Secondary | ICD-10-CM | POA: Diagnosis not present

## 2023-03-27 DIAGNOSIS — R000 Tachycardia, unspecified: Secondary | ICD-10-CM | POA: Diagnosis not present

## 2023-03-27 DIAGNOSIS — K449 Diaphragmatic hernia without obstruction or gangrene: Secondary | ICD-10-CM | POA: Diagnosis not present

## 2023-03-27 LAB — URINALYSIS, ROUTINE W REFLEX MICROSCOPIC
Bilirubin Urine: NEGATIVE
Glucose, UA: NEGATIVE mg/dL
Hgb urine dipstick: NEGATIVE
Ketones, ur: NEGATIVE mg/dL
Leukocytes,Ua: NEGATIVE
Nitrite: NEGATIVE
Protein, ur: NEGATIVE mg/dL
Specific Gravity, Urine: 1.008 (ref 1.005–1.030)
pH: 6 (ref 5.0–8.0)

## 2023-03-27 LAB — CBC WITH DIFFERENTIAL/PLATELET
Abs Immature Granulocytes: 0.02 10*3/uL (ref 0.00–0.07)
Basophils Absolute: 0 10*3/uL (ref 0.0–0.1)
Basophils Relative: 1 %
Eosinophils Absolute: 0 10*3/uL (ref 0.0–0.5)
Eosinophils Relative: 1 %
HCT: 37.3 % (ref 36.0–46.0)
Hemoglobin: 12.1 g/dL (ref 12.0–15.0)
Immature Granulocytes: 0 %
Lymphocytes Relative: 25 %
Lymphs Abs: 1.6 10*3/uL (ref 0.7–4.0)
MCH: 30.3 pg (ref 26.0–34.0)
MCHC: 32.4 g/dL (ref 30.0–36.0)
MCV: 93.3 fL (ref 80.0–100.0)
Monocytes Absolute: 0.4 10*3/uL (ref 0.1–1.0)
Monocytes Relative: 6 %
Neutro Abs: 4.3 10*3/uL (ref 1.7–7.7)
Neutrophils Relative %: 67 %
Platelets: 243 10*3/uL (ref 150–400)
RBC: 4 MIL/uL (ref 3.87–5.11)
RDW: 15.9 % — ABNORMAL HIGH (ref 11.5–15.5)
WBC: 6.3 10*3/uL (ref 4.0–10.5)
nRBC: 0 % (ref 0.0–0.2)

## 2023-03-27 LAB — LIPASE, BLOOD: Lipase: 36 U/L (ref 11–51)

## 2023-03-27 LAB — COMPREHENSIVE METABOLIC PANEL
ALT: 16 U/L (ref 0–44)
AST: 22 U/L (ref 15–41)
Albumin: 2.9 g/dL — ABNORMAL LOW (ref 3.5–5.0)
Alkaline Phosphatase: 53 U/L (ref 38–126)
Anion gap: 13 (ref 5–15)
BUN: 14 mg/dL (ref 8–23)
CO2: 20 mmol/L — ABNORMAL LOW (ref 22–32)
Calcium: 9.2 mg/dL (ref 8.9–10.3)
Chloride: 106 mmol/L (ref 98–111)
Creatinine, Ser: 1.27 mg/dL — ABNORMAL HIGH (ref 0.44–1.00)
GFR, Estimated: 44 mL/min — ABNORMAL LOW (ref >60)
Glucose, Bld: 122 mg/dL — ABNORMAL HIGH (ref 70–99)
Potassium: 4.1 mmol/L (ref 3.5–5.1)
Sodium: 139 mmol/L (ref 135–145)
Total Bilirubin: 0.5 mg/dL (ref 0.0–1.2)
Total Protein: 5.8 g/dL — ABNORMAL LOW (ref 6.5–8.1)

## 2023-03-27 LAB — POC OCCULT BLOOD, ED: Fecal Occult Bld: POSITIVE — AB

## 2023-03-27 MED ORDER — IOHEXOL 350 MG/ML SOLN
80.0000 mL | Freq: Once | INTRAVENOUS | Status: AC | PRN
  Administered 2023-03-27: 80 mL via INTRAVENOUS

## 2023-03-27 MED ORDER — ALPRAZOLAM 0.25 MG PO TABS
0.2500 mg | ORAL_TABLET | Freq: Once | ORAL | Status: AC
  Administered 2023-03-27: 0.25 mg via ORAL
  Filled 2023-03-27: qty 1

## 2023-03-27 NOTE — Discharge Instructions (Addendum)
 You were seen in the ER for evaluation of lower GI bleed.  I spoken with one of your GI partners who should be reaching out for you tomorrow.  Please make sure you call them if they have not called you.  Please continue taking your medications as prescribed.  If you have any worsening abdominal pain, fever, worsening bleeding, feeling lightheaded, having trouble with urinating or passing any bowel movements, please return to the nearest emergency department.  If you have any concerns, new or worsening symptoms, please return to the nearest emerged department for evaluation.  Contact a doctor if: Your symptoms do not get better with treatment. Get help right away if: Your bleeding does not stop. You feel dizzy or you pass out (faint). You feel weak. You have very bad cramps in your back or belly. You pass large clumps of blood (clots) in your poop. Your symptoms are getting worse. You have chest pain or fast heartbeats. These symptoms may be an emergency. Get help right away. Call 911. Do not wait to see if the symptoms will go away. Do not drive yourself to the hospital.

## 2023-03-27 NOTE — ED Provider Notes (Signed)
 [place] [facility] AT [facility] Provider Note   CSN: [id number] Arrival date & time: 03/27/23  1230     History  Chief Complaint  Patient presents with   GI Bleeding    [name] is a 78 y.o. female hypertension, hyperlipidemia, diverticulitis, GI bleed presents emergency department today for evaluation of hematochezia with melena today.  Patient reports she has had between 3 and 5 episodes.  She reports its black slime.  She denies any abdominal pain.  Denies any nausea, vomiting, dysuria, hematuria, rectal pain, or any fevers.  She denies any rectal bleeding other than when having a bowel movement.  She reports that she is not wearing a pad there is been no blood on the pad.  She reports compliancy to her medications.  Is not on any anticoagulants.  She denies any allergies to any medications.  Denies any tobacco, EtOH, licit drug use.  HPI     Home Medications Prior to Admission medications   Medication Sig Start Date End Date Taking? Authorizing Provider  acetaminophen  (TYLENOL ) 500 MG tablet Take 1,000 mg by mouth every 6 (six) hours as needed (for pain.).     [provider]  albuterol  (PROVENTIL  HFA;VENTOLIN  HFA) 108 (90 Base) MCG/ACT inhaler Inhale 2 puffs into the lungs every 6 (six) hours as needed for shortness of breath or wheezing.    [provider]  ALPRAZolam  (XANAX ) 0.25 MG tablet Take 0.25 mg by mouth daily as needed for anxiety.    [provider]  BIOTIN  PO Take 1 tablet by mouth daily.    [provider]  Cholecalciferol  (DIALYVITE VITAMIN D  5000) 125 MCG (5000 UT) capsule Take 5,000 Units by mouth 2 (two) times daily.    [provider]  colestipol  (COLESTID ) 1 g tablet Take once daily and titrate as needed 01/26/22   Armbruster, Elspeth SQUIBB, MD  cyclobenzaprine (FLEXERIL) 10 MG tablet Take 10 mg by mouth 3 (three) times daily as needed for muscle spasms.    [provider]   diltiazem  (CARDIZEM  CD) 240 MG 24 hr capsule TAKE 1 CAPSULE BY MOUTH EVERY DAY Patient taking differently: Take 240 mg by mouth daily. 10/05/22   Court Dorn PARAS, MD  DULoxetine  (CYMBALTA ) 60 MG capsule SMARTSIG:1 Capsule(s) By Mouth Every Evening 12/09/22   [provider]  hydrOXYzine (ATARAX) 25 MG tablet 1 tablet Orally as needed every 8hrs for 30 days 12/20/22   [provider]  loratadine  (CLARITIN ) 10 MG tablet Take 10 mg by mouth daily.    [provider]  losartan  (COZAAR ) 50 MG tablet Take 50 mg by mouth daily. 09/24/19   [provider]  Melatonin 10 MG TABS Take 10 mg by mouth at bedtime.    [provider]  memantine  (NAMENDA ) 5 MG tablet TAKE 1 TABLET BY MOUTH EVERYDAY AT BEDTIME 12/08/22   Wertman, Sara E, PA-C  Multiple Vitamin (MULTIVITAMIN WITH MINERALS) TABS tablet Take 1 tablet by mouth daily. Centrum    [provider]  ondansetron  (ZOFRAN -ODT) 8 MG disintegrating tablet DISSOLVE 1 TABLET IN MOUTH EVERY 8 HRS AS NEEDED NAUSEA 01/21/23   Armbruster, Elspeth SQUIBB, MD  pantoprazole  (PROTONIX ) 40 MG tablet Take 1 tablet (40 mg total) by mouth 2 (two) times daily before a meal. 03/07/23   Kennedy-Smith, Elida HERO, NP  simvastatin  (ZOCOR ) 20 MG tablet Take 1 tablet (20 mg total) by mouth daily. 08/25/16   Court Dorn PARAS, MD  vitamin B-12 (CYANOCOBALAMIN ) 1000  MCG tablet Take 1,000 mcg by mouth daily.    [provider]      Allergies    Ativan  [lorazepam ], Cefdinir, and Buprenorphine hcl    Review of Systems   Review of Systems  Constitutional:  Negative for chills and fever.  Respiratory:  Negative for shortness of breath.   Cardiovascular:  Negative for chest pain.  Gastrointestinal:  Positive for anal bleeding, blood in stool and diarrhea. Negative for abdominal pain, nausea, rectal pain and vomiting.  Genitourinary:  Negative for dysuria and hematuria.    Physical Exam Updated Vital Signs BP (!) 133/96   Pulse  86   Temp 98.1 F (36.7 C) (Oral)   Resp 13   SpO2 100%  Physical Exam Vitals and nursing note reviewed. Exam conducted with a chaperone present Abigail, EMT-P).  Constitutional:      General: She is not in acute distress.    Appearance: She is not ill-appearing or toxic-appearing.  Eyes:     General: No scleral icterus. Cardiovascular:     Rate and Rhythm: Normal rate.  Pulmonary:     Effort: Pulmonary effort is normal. No respiratory distress.  Abdominal:     Palpations: Abdomen is soft.     Tenderness: There is no abdominal tenderness. There is no guarding or rebound.  Genitourinary:    Comments: Skin tag seen on the anus. No large external hemorrhoid appreciated. Darker blood appreciated on DRE with some mucous. Very small amount of seedy stool present.  Skin:    General: Skin is dry.  Neurological:     Mental Status: She is alert.     ED Results / Procedures / Treatments   Labs (all labs ordered are listed, but only abnormal results are displayed) Labs Reviewed  COMPREHENSIVE METABOLIC PANEL - Abnormal; Notable for the following components:      Result Value   CO2 20 (*)    Glucose, Bld 122 (*)    Creatinine, Ser 1.27 (*)    Total Protein 5.8 (*)    Albumin 2.9 (*)    GFR, Estimated 44 (*)    All other components within normal limits  CBC WITH DIFFERENTIAL/PLATELET - Abnormal; Notable for the following components:   RDW 15.9 (*)    All other components within normal limits  POC OCCULT BLOOD, ED - Abnormal; Notable for the following components:   Fecal Occult Bld POSITIVE (*)    All other components within normal limits  URINALYSIS, ROUTINE W REFLEX MICROSCOPIC  LIPASE, BLOOD    EKG None  Radiology CT ANGIO GI BLEED Result Date: 03/27/2023 CLINICAL DATA:  Left upper quadrant pain, melena, bright red blood per rectum. EXAM: CTA ABDOMEN AND PELVIS WITHOUT AND WITH CONTRAST TECHNIQUE: Multidetector CT imaging of the abdomen and pelvis was performed using the  standard protocol during bolus administration of intravenous contrast. Multiplanar reconstructed images and MIPs were obtained and reviewed to evaluate the vascular anatomy. RADIATION DOSE REDUCTION: This exam was performed according to the departmental dose-optimization program which includes automated exposure control, adjustment of the mA and/or kV according to patient size and/or use of iterative reconstruction technique. CONTRAST:  80mL OMNIPAQUE  IOHEXOL  350 MG/ML SOLN COMPARISON:  Nuclear medicine GI blood loss 12/03/2022. CTA GI bleed 11/30/2022. FINDINGS: VASCULAR Aorta: Normal caliber aorta without aneurysm, dissection, vasculitis or significant stenosis. There is mild calcified atherosclerotic disease throughout the aorta. Celiac: Patent without evidence of aneurysm, dissection, vasculitis or significant stenosis. SMA: Patent without evidence of aneurysm, dissection, vasculitis or significant  stenosis. Renals: Both renal arteries are patent without evidence of aneurysm, dissection, vasculitis, fibromuscular dysplasia or significant stenosis. IMA: Patent without evidence of aneurysm, dissection, vasculitis or significant stenosis. Inflow: Patent without evidence of aneurysm, dissection, vasculitis or significant stenosis. Proximal Outflow: Bilateral common femoral and visualized portions of the superficial and profunda femoral arteries are patent without evidence of aneurysm, dissection, vasculitis or significant stenosis. Veins: No obvious venous abnormality within the limitations of this arterial phase study. Review of the MIP images confirms the above findings. NON-VASCULAR Lower chest: No acute abnormality. Hepatobiliary: No focal liver abnormality is seen. Status post cholecystectomy. No biliary dilatation. Pancreas: Unremarkable. No pancreatic ductal dilatation or surrounding inflammatory changes. Spleen: Normal in size without focal abnormality. Adrenals/Urinary Tract: Right adrenal calcifications  are again seen which may be related to prior trauma or hemorrhage. Otherwise, the bilateral adrenal glands are within normal limits. There is no hydronephrosis or perinephric fluid. There is a cyst in the superior pole the left kidney measuring 12 mm. Bladder is not well seen secondary to streak artifact in the pelvis. Stomach/Bowel: No evidence of bowel wall thickening, distention, or inflammatory changes. There are postsurgical changes in the stomach. There is a small hiatal hernia. No active gastrointestinal bleeding identified. There is diffuse colonic diverticulosis. The appendix is not seen. Lymphatic: No enlarged lymph nodes are seen. Reproductive: Status post hysterectomy. No adnexal masses. Other: No abdominal wall hernia or abnormality. No abdominopelvic ascites. Musculoskeletal: Degenerative changes affect the spine. Bilateral hip arthroplasties are present. IMPRESSION: VASCULAR 1. No evidence for active gastrointestinal bleeding. Aortic Atherosclerosis (ICD10-I70.0). NON-VASCULAR 1. No acute localizing process in the abdomen or pelvis. 2. Colonic diverticulosis. 3. Small hiatal hernia. 4. Left Bosniak I benign renal cyst measuring 1.2 cm. No follow-up imaging recommended. Electronically Signed   By: Greig Pique M.D.   On: 03/27/2023 20:54    Procedures Procedures   Medications Ordered in ED Medications  ALPRAZolam  (XANAX ) tablet 0.25 mg (0.25 mg Oral Given 03/27/23 1901)    ED Course/ Medical Decision Making/ A&P Clinical Course as of 03/27/23 2317  Sun Mar 27, 2023  2158 Dr. Rollin  [RR]    Clinical Course User Index [RR] Bernis Ernst, PA-C   Medical Decision Making Amount and/or Complexity of Data Reviewed Radiology: ordered.  Risk Prescription drug management.   78 y.o. female presents to the ER for evaluation of GI bleed. Differential diagnosis includes but is not limited to PUD, upper GI bleed, lower GI bleed, hemorrhoids, diverticulitis, colitis. Vital signs mildly  elevated blood pressure 133/96, otherwise unremarkable. Physical exam as noted above.   Patient reports she was having some anxiety.  She takes Xanax  0.25 mg as needed.  Home dose ordered.  On previous chart evaluation, patient was admitted in September for bright red blood per rectum and black stools. [facility] is her specialist. It appears she was seen, however I do not see a colonoscopy mentioned.   I independently reviewed and interpreted the patient's labs.  CMP shows bicarb of 20, glucose 122.  Creatinine 1.27 which based on patient's baseline.  Mildly decreased total protein and albumin.  No other electrolyte or LFT abnormality.  Urinalysis unremarkable.  CBC without leukocytosis or anemia.  Hemoglobin appears to be around baseline.  Lipase within normal limits.  Patient is Hemoccult positive however she did have some gross frank red blood on DRE.  CT imaging shows IMPRESSION: VASCULAR 1. No evidence for active gastrointestinal bleeding. Aortic Atherosclerosis (ICD10-I70.0). NON-VASCULAR 1. No acute localizing process in  the abdomen or pelvis. 2. Colonic diverticulosis. 3. Small hiatal hernia. 4. Left Bosniak I benign renal cyst measuring 1.2 cm. No follow-up imaging recommended. Per radiologist's interpretation.  I consulted GI and spoke with Dr. Rollin who is taking call for [facility]. He reports that he feels that the patient is appropriate for discharge home and can follow up with GI in the morning. He reports that he will get in touch with the office to give her a call.   The patient reports she has not had any bloody bowel movements since being here. She does not have any belly pain. She would like to go home.  Her hemoglobin is stable.  Her CTA does not show any acute bleed.  She does not have any brisk bleeding from her rectum.  Her vital signs show mild hypertensive otherwise she is hemodynamically stable.  I do feel get appropriate for her to follow-up with [facility] tomorrow and return  for any new or worsening symptoms.  We discussed the results of the labs/imaging. The plan is follow-up with our, return to the ER for any new or worsening symptoms. We discussed strict return precautions and red flag symptoms. The patient verbalized their understanding and agrees to the plan. The patient is stable and being discharged home in good condition.  Portions of this report may have been transcribed using voice recognition software. Every effort was made to ensure accuracy; however, inadvertent computerized transcription errors may be present.   Final Clinical Impression(s) / ED Diagnoses Final diagnoses:  Lower GI bleed    Rx / DC Orders ED Discharge Orders     None         Bernis Ernst, PA-C 03/27/23 2325    Tegeler, Lonni PARAS, MD 03/27/23 2329

## 2023-03-27 NOTE — ED Notes (Signed)
Discharge instructions reviewed with patient. Patient questions answered and opportunity for education reviewed. Patient voices understanding of discharge instructions with no further questions. Patient to lobby via wheelchair. 

## 2023-03-27 NOTE — ED Triage Notes (Signed)
 Pt c/o both bright red and black stool that started this morning. Hx of GI bleeds. Not anticoagulated.

## 2023-03-27 NOTE — ED Provider Triage Note (Signed)
 Emergency Medicine Provider Triage Evaluation Note  SEIDY LABRECK , a 78 y.o. female  was evaluated in triage.  Pt complains of rectal bleeding.  Review of Systems  Positive: Tarry stools and BRB, Hx: diverticulitis, low back/abd pain, dizziness Negative: N/V, fever, chills, HA, Sob, CP  Physical Exam  BP 103/89 (BP Location: Right Arm)   Pulse (!) 108   Temp (!) 97.4 F (36.3 C)   Resp (!) 22   SpO2 96%  Gen:   Awake, no distress   Resp:  Normal effort  MSK:   Moves extremities without difficulty  Other:    Medical Decision Making  Medically screening exam initiated at 12:40 PM.  Appropriate orders placed.  IZZIE GEERS was informed that the remainder of the evaluation will be completed by another provider, this initial triage assessment does not replace that evaluation, and the importance of remaining in the ED until their evaluation is complete.  Labs ordered   Francis Ileana SAILOR, PA-C 03/27/23 1246

## 2023-03-28 ENCOUNTER — Telehealth: Payer: Self-pay | Admitting: Gastroenterology

## 2023-03-28 NOTE — Telephone Encounter (Signed)
 Returned call to patient. We reviewed ED visit from yesterday. Pt states that she passed another bloody stool this morning. Pt only notices blood with a BM, which she only has when she eats. Pt describes as seeing BRB with black slimy pieces. Pt denies any use of iron or Pepto bismol, denies abdominal and rectal pain. Pt denies any SOB, dizziness, or fatigue. I reassured pt that based on her labs and imaging from ED she is stable at this time. Pt states that she has not been eating, she can tolerate solid foods but she is scared about blood in her stool again. I told pt that she can increase her diet as tolerated. She needs to drink plenty of fluids. Pt has been advised to monitor symptoms today. Pt is aware that she may have internal hemorrhoids, less likely a fissure since DRE was performed yesterday. Pt knows to report to ER if she has rectal bleeding without any stool, or if she develops severe abdominal pain, SOB, dizziness, or fatigue. Pt will keep OV tomorrow for further assessment. Pt verbalized understanding and had no concerns at the end of the call.

## 2023-03-28 NOTE — Telephone Encounter (Signed)
 Patient called and stated and she was seen in the ED for rectal bleeding per information below but she also has some concerns that she would like to go over with a nurse. Patient is requesting a call back. Please advise.

## 2023-03-28 NOTE — Telephone Encounter (Signed)
 Patient called to schedule an appt with Dr. Leigh following going to the ED with a rectal bleed.  There was an appointment available tomorrow at 11 and she was put in that spot.  She also had an appointment scheduled with him on 2/13.  That appointment was left there in case she needed a follow up.  Patient also stated she was still bleeding this morning.  Just wanted to give you a head's up that she was put on the schedule for tomorrow at 11.

## 2023-03-29 ENCOUNTER — Other Ambulatory Visit (INDEPENDENT_AMBULATORY_CARE_PROVIDER_SITE_OTHER): Payer: Medicare Other

## 2023-03-29 ENCOUNTER — Ambulatory Visit: Payer: Medicare Other | Admitting: Cardiovascular Disease

## 2023-03-29 ENCOUNTER — Telehealth: Payer: Self-pay

## 2023-03-29 ENCOUNTER — Ambulatory Visit (INDEPENDENT_AMBULATORY_CARE_PROVIDER_SITE_OTHER): Payer: Medicare Other | Admitting: Gastroenterology

## 2023-03-29 VITALS — BP 112/72 | HR 95 | Ht 62.0 in | Wt 225.2 lb

## 2023-03-29 DIAGNOSIS — K625 Hemorrhage of anus and rectum: Secondary | ICD-10-CM | POA: Diagnosis not present

## 2023-03-29 DIAGNOSIS — K5730 Diverticulosis of large intestine without perforation or abscess without bleeding: Secondary | ICD-10-CM

## 2023-03-29 DIAGNOSIS — D649 Anemia, unspecified: Secondary | ICD-10-CM

## 2023-03-29 LAB — HEMOGLOBIN: Hemoglobin: 12.4 g/dL (ref 12.0–15.0)

## 2023-03-29 LAB — BUN: BUN: 19 mg/dL (ref 6–23)

## 2023-03-29 MED ORDER — SUTAB 1479-225-188 MG PO TABS: 24.0000 | ORAL_TABLET | Freq: Once | ORAL | Status: AC

## 2023-03-29 NOTE — Progress Notes (Signed)
 HPI :  78 year old female here for follow-up visit for rectal bleeding.  I know her well for multiple issues over time.  Recall she had a history of GI bleeding in the past.  Was admitted to the hospital this past September with bright red blood per rectum.  She had a CTA initially that was negative for GI bleeding.  Her hemoglobin dropped 3 points during the hospital stay.  She had a tagged RBC scan which was also negative.  She eventually stopped bleeding on her own which was discharged.  Recall she has a history of A-fib but not currently on anticoagulation.  Recall her last colonoscopy was in 2021, significant diverticulosis.  She states that September she had not had any problems with blood in her stool until this past Sunday.  She woke up with red blood in her stool and on the tissue.  She states she is also passed brown stool with dark clots.  She states whenever she wipes herself she has bright red blood per rectum and red blood leeches out of the darker clot.  Stool is brown in color.  She was seen in the ED on January 5.  Her hemoglobin was normal at 12, they did a CTA which was negative.  She had 1 episode of bleeding at that time and had no recurrence while monitored in the ED and they discharged her home.  She states no bleeding Sunday night, had a few episodes on Monday and another episode Monday night.  She has not had an episode of bleeding today.  She states again she has bright red blood on the toilet paper in the toilet and stool seems to be dark brown.  She denies any abdominal pains right now or rectal pain with this.  She had a rectal exam in the ED showing some old blood in the vault, no concerning pathology.  She does feel fatigued.  Again she remains off anticoagulation.  She states every time I eat it stimulates her to have a bowel movement.  If she states she does not eat she does not have a bowel movement and hence does not bleed.  She has been maintaining hydration  with drinking a lot of tea.  Most recent imaging workup: CTA 03/26/22: IMPRESSION: VASCULAR 1. No evidence for active gastrointestinal bleeding.    NON-VASCULAR   1. No acute localizing process in the abdomen or pelvis. 2. Colonic diverticulosis. 3. Small hiatal hernia. 4. Left Bosniak I benign renal cyst measuring 1.2 cm. No follow-up imaging recommended.    Tagged RBC scan 12/03/2022: FINDINGS: No abnormal areas of increased radiotracer uptake identified to indicate active GI bleeding. Normal physiologic distribution of the radiopharmaceutical is noted within the blood pool, liver, spleen and urinary bladder.   IMPRESSION: Examination is negative for active GI bleeding    CTA 11/30/2022: FINDINGS: VASCULAR   Aorta: Moderate partially calcified atheromatous plaque without aneurysm, dissection, for for   Celiac: Calcified ostial plaque without significant stenosis, patent distally.   SMA: Eccentric short-segment calcified ostial plaque without high-grade stenosis, patent distally with classic branch anatomy.   Renals: Single bilaterally, both with calcified ostial plaque resulting in mild stenosis, patent distally.   IMA: Patent without evidence of aneurysm, dissection, vasculitis or significant stenosis.   Inflow: Mild scattered calcified plaque in bilateral common and internal iliac arteries. No aneurysm or stenosis. External iliac arteries normal.   Proximal Outflow: Bilateral common femoral and visualized portions of the superficial and profunda femoral arteries are  patent without evidence of aneurysm, dissection, vasculitis or significant stenosis.   Veins: Patent hepatic veins, portal vein, SMV, splenic vein, bilateral renal veins, iliac venous system and IVC.   Review of the MIP images confirms the above findings.   NON-VASCULAR   Lower chest: No pleural or pericardial effusion. Visualized lung bases clear.   Hepatobiliary: No focal liver  abnormality is seen. Status post cholecystectomy. Ectatic central intrahepatic biliary tree and CBD, stable.   Pancreas: Unremarkable. No pancreatic ductal dilatation or surrounding inflammatory changes.   Spleen: Normal in size without focal abnormality.   Adrenals/Urinary Tract: Chronic linear right adrenal calcifications without mass. No urolithiasis. No hydronephrosis. Stable 1.5 cm left upper pole cyst; no follow-up recommended. Urinary bladder physiologically distended.   Stomach/Bowel: No active GI bleed. Moderate hiatal hernia. Post gastric bypass surgery. Small bowel decompressed. Appendix not identified. The colon is partially distended with multiple scattered diverticula throughout, most numerous in the distal descending and sigmoid segments, without adjacent inflammatory change.   Lymphatic: No abdominal or pelvic adenopathy.   Reproductive: Status post hysterectomy. No adnexal masses.   Other: No ascites.  No free air.   Musculoskeletal: Mild spondylitic changes throughout the visualized lower thoracic and lumbar spine. Bilateral hip arthroplasty components project in expected location. No fracture or worrisome bone lesion.   IMPRESSION: 1. No active GI bleed. 2. Colonic diverticulosis without acute inflammation. 3. Moderate hiatal hernia. 4. Aortic Atherosclerosis     MOST RECENT GI PROCEDURES:   Colonoscopy 10/18/2019 by Dr. Leigh: - The examined portion of the ileum was normal.  - Diverticulosis in the entire examined colon. - Three 2 to 3 mm polyps in the transverse colon, removed with a cold biopsy forceps. Resected and retrieved.  - Internal hemorrhoids, small.  - The examination was otherwise normal. Brown stool throughout, no heme appreciated. Suspect the patient has had low grade stuttering diverticular bleed, hopefully resolved with holding Eliquis  for the past 5 days.  -Consider a repeat colonoscopy in 5 to 7 years   Surgical [P], colon,  transverse, polyps (3) - TUBULAR ADENOMA (5 OF 6 FRAGMENTS) - BENIGN COLONIC MUCOSA (1 OF 6 FRAGMENTS) - NO HIGH GRADE DYSPLASIA OR MALIGNANCY IDENTIFIED   EGD 09/20/2019: - Esophagogastric landmarks identified.  - 3 cm hiatal hernia.  - Normal esophagus otherwise  - Empiric dilation performed at 17mm and 18mm as above  - A sleeve gastrectomy was found, characterized by healthy appearing mucosa.  - Normal duodenal bulb and second portion of the duodenum.   EGD 04/15/2017: - Esophagogastric landmarks identified.  - Normal esophagus - A sleeve gastrectomy was found, characterized by healthy appearing mucosa.  - Erythematous mucosa in the antrum.  - Normal duodenal bulb and second portion of the duodenum.  - Biopsies were taken with a cold forceps for Helicobacter pylori testing.   Colonoscopy 04/15/2017: - The examined portion of the ileum was normal.  - Diverticulosis in the entire examined colon.  - The examination was otherwise normal on direct and retroflexion views.  - Biopsies were taken with a cold forceps from the right colon, left colon and transverse colon for evaluation of microscopic colitis.  Past Medical History:  Diagnosis Date   Allergy    Anemia    Antral polyp    benign   Anxiety    Arthritis    Asthma    B12 deficiency    Back pain    Chest pain    Constipation    Depression  Diverticulosis    Gallbladder problem    GERD (gastroesophageal reflux disease) 09/09/2011   History of transfusion    HTN (hypertension) 09/09/2011   Hyperlipemia 09/09/2011   Hypertension    IBS (irritable bowel syndrome)    Internal hemorrhoid    Joint pain    Obesity    Osteoarthritis    Paroxysmal atrial fibrillation (HCC)    Pneumonia    PONV (postoperative nausea and vomiting)    with big surgeries, none with endos etc, with colonoscopy- 5-6 years ago could not swallow    Shortness of breath    Sleep apnea    borderline per patient no cpap    SOB (shortness of  breath) on exertion    Swallowing difficulty    Tachycardia    Unstable angina (HCC) 09/09/2011   Vitamin D  deficiency    Vocal cord dysfunction 2013   swelling     Past Surgical History:  Procedure Laterality Date   ABDOMINAL HYSTERECTOMY  1988   ANKLE FUSION Right    x 2   BACK SURGERY  [phone]   3 ruptured discs, lower back   BILATERAL ANTERIOR TOTAL HIP ARTHROPLASTY Bilateral 11/12/2014   Procedure: BILATERAL ANTERIOR TOTAL HIP ARTHROPLASTY;  Surgeon: Donnice Car, MD;  Location: [facility];  Service: Orthopedics;  Laterality: Bilateral;   BRAVO PH STUDY [name] 03/06/2013   Procedure: BRAVO PH STUDY;  Surgeon: Lamar JONETTA Aho, MD;  Location: [facility];  Service: Endoscopy;  Laterality: [name];   CARDIAC CATHETERIZATION  2013   NORMAL   CHOLECYSTECTOMY     COLONOSCOPY     ELBOW SURGERY Left    removed bone chip   ESOPHAGOGASTRODUODENOSCOPY [name] 03/06/2013   Procedure: ESOPHAGOGASTRODUODENOSCOPY (EGD);  Surgeon: Lamar JONETTA Aho, MD;  Location: [facility];  Service: Endoscopy;  Laterality: [name];   FOOT OSTEOTOMY W/ PLANTAR FASCIA RELEASE Left    ILIOTIBIAL BAND RELEASE Right 1997   implantable loop recorder placement  01/23/2020   Medtronic Reveal Linq model LNQ 22 (RLB [id number]) implantable loop recorder   JOINT REPLACEMENT     LAPAROSCOPIC GASTRIC SLEEVE RESECTION WITH HIATAL HERNIA REPAIR  08/20/2014   Procedure: LAPAROSCOPIC GASTRIC SLEEVE RESECTION WITH HIATAL HERNIA  REPAIR AND  UPPER ENDOSCOPY;  Surgeon: Donnice Lunger, MD;  Location: [facility];  Service: General;;   LEFT HEART CATHETERIZATION WITH CORONARY ANGIOGRAM Bilateral 09/10/2011   Procedure: LEFT HEART CATHETERIZATION WITH CORONARY ANGIOGRAM;  Surgeon: Dorn JINNY Lesches, MD;  Location: [facility] C[facility] LAB;  Service: Cardiovascular;  Laterality: Bilateral;   NASAL SINUS SURGERY  1997   REVERSE SHOULDER ARTHROPLASTY Right 05/14/2016   Procedure: RIGHT REVERSE SHOULDER ARTHROPLASTY;  Surgeon: Marcey Her, MD;  Location: [facility] OR;   Service: Orthopedics;  Laterality: Right;   REVERSE SHOULDER ARTHROPLASTY Left 05/16/2020   Procedure: REVERSE SHOULDER ARTHROPLASTY;  Surgeon: Her Marcey, MD;  Location: [facility];  Service: Orthopedics;  Laterality: Left;  interscalene exparel    SHOULDER SURGERY Left    tfc wrist Left 1997   thumb replacement Bilateral    one 2009 and one 2014   TOTAL ANKLE REPLACEMENT Right    x 3   TOTAL ANKLE REPLACEMENT Left    TOTAL ANKLE REPLACEMENT     TOTAL HIP ARTHROPLASTY Bilateral    TOTAL KNEE ARTHROPLASTY Bilateral    left 2002, right 2012   TOTAL SHOULDER ARTHROPLASTY Right 05/14/2016   TRANSTHORACIC ECHOCARDIOGRAM  09/09/2011   MILD CONCENTRIC LVH. TRACE MITRAL REGURG. TRACE TR. MILD AORTIC REGURG.   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   UPPER GI ENDOSCOPY  08/20/2014   Procedure: UPPER GI ENDOSCOPY;  Surgeon: Donnice Lunger, MD;  Location: [facility];  Service: General;;   WRIST SURGERY Left 1997, 2009    Family History  Problem Relation Age of Onset   Hypertension Mother    Diabetes Mother    Arthritis Mother    Esophageal cancer Mother    Cancer Mother    Esophageal cancer Father 69   Cancer Father    Alcoholism Father    Other Sister        kidney mass removed   Hypertension Sister    Stroke Sister        died in November 28, 2018  Heart disease Maternal Grandmother    Stroke Paternal Grandmother    Coronary artery disease Neg Hx    Colon cancer Neg Hx    Liver disease Neg Hx    Kidney disease Neg Hx    Rectal cancer Neg Hx    Stomach cancer Neg Hx    Social History   Tobacco Use   Smoking status: Never   Smokeless tobacco: Never  Vaping Use   Vaping status: Never Used  Substance Use Topics   Alcohol  use: No   Drug use: No   Current Outpatient Medications  Medication Sig Dispense Refill   acetaminophen  (TYLENOL ) 500 MG tablet Take 1,000 mg by mouth every 6 (six) hours as needed (for pain.).      albuterol  (PROVENTIL  HFA;VENTOLIN  HFA) 108 (90 Base)  MCG/ACT inhaler Inhale 2 puffs into the lungs every 6 (six) hours as needed for shortness of breath or wheezing.     ALPRAZolam  (XANAX ) 0.25 MG tablet Take 0.25 mg by mouth daily as needed for anxiety.     BIOTIN  PO Take 1 tablet by mouth daily.     cetirizine (ZYRTEC) 10 MG chewable tablet Chew 10 mg by mouth daily.     Cholecalciferol  (DIALYVITE VITAMIN D  5000) 125 MCG (5000 UT) capsule Take 5,000 Units by mouth 2 (two) times daily.     cyclobenzaprine (FLEXERIL) 10 MG tablet Take 10 mg by mouth 3 (three) times daily as needed for muscle spasms.     diltiazem  (CARDIZEM  CD) 240 MG 24 hr capsule TAKE 1 CAPSULE BY MOUTH EVERY DAY (Patient taking differently: Take 240 mg by mouth daily.) 90 capsule 3   famotidine (PEPCID) 20 MG tablet Take 20 mg by mouth 2 (two) times daily.     loratadine  (CLARITIN ) 10 MG tablet Take 10 mg by mouth daily.     losartan  (COZAAR ) 50 MG tablet Take 50 mg by mouth daily.     Melatonin 10 MG TABS Take 10 mg by mouth at bedtime.     Multiple Vitamin (MULTIVITAMIN WITH MINERALS) TABS tablet Take 1 tablet by mouth daily. Centrum     ondansetron  (ZOFRAN -ODT) 8 MG disintegrating tablet DISSOLVE 1 TABLET IN MOUTH EVERY 8 HRS AS NEEDED NAUSEA 30 tablet 1   pantoprazole  (PROTONIX ) 40 MG tablet Take 1 tablet (40 mg total) by mouth 2 (two) times daily before a meal. 60 tablet 3   simvastatin  (ZOCOR ) 20 MG tablet Take 1 tablet (20 mg total) by mouth daily. 30 tablet 11   vitamin B-12 (CYANOCOBALAMIN ) 1000 MCG tablet Take 1,000 mcg by mouth daily.     No current facility-administered medications for this visit.   Allergies  Allergen Reactions   Ativan  [Lorazepam ] Other (See Comments)    Agitation, aggressive  actions, significant mental changes   Cefdinir Hives    headache   Buprenorphine Hcl Itching and Rash     Review of Systems: All systems reviewed and negative except where noted in HPI.    CT ANGIO GI BLEED Result Date: 03/27/2023 CLINICAL DATA:  Left upper  quadrant pain, melena, bright red blood per rectum. EXAM: CTA ABDOMEN AND PELVIS WITHOUT AND WITH CONTRAST TECHNIQUE: Multidetector CT imaging of the abdomen and pelvis was performed using the standard protocol during bolus administration of intravenous contrast. Multiplanar reconstructed images and MIPs were obtained and reviewed to evaluate the vascular anatomy. RADIATION DOSE REDUCTION: This exam was performed according to the departmental dose-optimization program which includes automated exposure control, adjustment of the mA and/or kV according to patient size and/or use of iterative reconstruction technique. CONTRAST:  80mL OMNIPAQUE  IOHEXOL  350 MG/ML SOLN COMPARISON:  Nuclear medicine GI blood loss 12/03/2022. CTA GI bleed 11/30/2022. FINDINGS: VASCULAR Aorta: Normal caliber aorta without aneurysm, dissection, vasculitis or significant stenosis. There is mild calcified atherosclerotic disease throughout the aorta. Celiac: Patent without evidence of aneurysm, dissection, vasculitis or significant stenosis. SMA: Patent without evidence of aneurysm, dissection, vasculitis or significant stenosis. Renals: Both renal arteries are patent without evidence of aneurysm, dissection, vasculitis, fibromuscular dysplasia or significant stenosis. IMA: Patent without evidence of aneurysm, dissection, vasculitis or significant stenosis. Inflow: Patent without evidence of aneurysm, dissection, vasculitis or significant stenosis. Proximal Outflow: Bilateral common femoral and visualized portions of the superficial and profunda femoral arteries are patent without evidence of aneurysm, dissection, vasculitis or significant stenosis. Veins: No obvious venous abnormality within the limitations of this arterial phase study. Review of the MIP images confirms the above findings. NON-VASCULAR Lower chest: No acute abnormality. Hepatobiliary: No focal liver abnormality is seen. Status post cholecystectomy. No biliary dilatation.  Pancreas: Unremarkable. No pancreatic ductal dilatation or surrounding inflammatory changes. Spleen: Normal in size without focal abnormality. Adrenals/Urinary Tract: Right adrenal calcifications are again seen which may be related to prior trauma or hemorrhage. Otherwise, the bilateral adrenal glands are within normal limits. There is no hydronephrosis or perinephric fluid. There is a cyst in the superior pole the left kidney measuring 12 mm. Bladder is not well seen secondary to streak artifact in the pelvis. Stomach/Bowel: No evidence of bowel wall thickening, distention, or inflammatory changes. There are postsurgical changes in the stomach. There is a small hiatal hernia. No active gastrointestinal bleeding identified. There is diffuse colonic diverticulosis. The appendix is not seen. Lymphatic: No enlarged lymph nodes are seen. Reproductive: Status post hysterectomy. No adnexal masses. Other: No abdominal wall hernia or abnormality. No abdominopelvic ascites. Musculoskeletal: Degenerative changes affect the spine. Bilateral hip arthroplasties are present. IMPRESSION: VASCULAR 1. No evidence for active gastrointestinal bleeding. Aortic Atherosclerosis (ICD10-I70.0). NON-VASCULAR 1. No acute localizing process in the abdomen or pelvis. 2. Colonic diverticulosis. 3. Small hiatal hernia. 4. Left Bosniak I benign renal cyst measuring 1.2 cm. No follow-up imaging recommended. Electronically Signed   By: Greig Pique M.D.   On: 03/27/2023 20:54   CUP PACEART REMOTE DEVICE CHECK Result Date: 03/15/2023 ILR summary report received. Battery status OK. Normal device function. No new symptom, tachy, brady, or pause episodes. No new AF episodes. Monthly summary reports and ROV/PRN KS, CVRS   Lab Results  Component Value Date   WBC 6.3 03/27/2023   HGB 12.1 03/27/2023   HCT 37.3 03/27/2023   MCV 93.3 03/27/2023   PLT 243 03/27/2023       Latest Ref Rng & Units  03/27/2023   12:49 PM 01/31/2023   12:13 PM  12/09/2022    1:13 PM  CBC  WBC 4.0 - 10.5 K/uL 6.3  6.0  5.9   Hemoglobin 12.0 - 15.0 g/dL 87.8  87.9  9.0   Hematocrit 36.0 - 46.0 % 37.3  37.5  28.3   Platelets 150 - 400 K/uL 243  190.0  261.0      Physical Exam: BP 112/72   Pulse 95   Ht 5' 2 (1.575 m)   Wt 225 lb 4 oz (102.2 kg)   SpO2 98%   BMI 41.20 kg/m  Constitutional: Pleasant,well-developed, female in no acute distress. Neurological: Alert and oriented to person place and time. Psychiatric: Normal mood and affect. Behavior is normal.   ASSESSMENT: 78 y.o. female here for assessment of the following  1. Rectal bleeding   2. Diverticular disease of colon    Admitted in September with a suspected diverticular bleed that take a few days to resolve.  She had a negative CTA and tagged RBC scan at that time.  Bleeding had resolved and then she had recurrence this past Sunday night on January 5.  Seen in the ED with a negative CTA again and stable hemoglobin in the 12's.  Discharged from the ED.  She has had a few more bowel movements, mostly bright red blood with dark brown stool.  Her BUN was normal in the ED.  This sounds very much like stuttering diverticular bleeding versus anorectal bleeding.  I doubt this is an upper GI bleed based on her description of symptoms today.  We discussed options moving forward.  I hope with more time this may resolve on its own.  I will send her to the lab today for stat labs.  If she has had any significant drop in her hemoglobin she will need to be admitted to the hospital.  If her hemoglobin remains normal we will continue to monitor.  I recommend she stay on a clear liquid diet for now and drink plenty of fluids with either Gatorade or Pedialyte.  Her blood pressure stable, her heart rate is stable in the 90s.  I do think we may need to repeat a colonoscopy for her in the next few days.  If her hemoglobin is stable we can try to do a colonoscopy tomorrow or Thursday in outpatient setting.   They would really prefer to avoid being hospitalized if bleeding is mild and hemoglobin is stable.  If she has any significant bleeding again she will need to be hospitalized for this workup.  In the interim while we are sorting this out, if she has any significant bleeding she will need to go back to the ED.  Hopefully hemoglobin remained stable at this time. No bleeding so far today. If this is diverticular hopefully this improves with more time.   PLAN: - Hgb and BUN STAT to determine if she needs to be readmitted to the hospital or not. - clear liquid diet - drink gatorade / pedialyte instead of water  or tea if she is on clear liquids. - possible colonoscopy at [facility] in the next 1-2 days pending her course and if stable for outpatient workup. She really wants to remain out of the hospital but if any significant bleeding will need admission. She understands this. Currently vitals are stable and no further bleeding today so far   Marcey Naval, MD [facility]

## 2023-03-29 NOTE — Patient Instructions (Addendum)
 Please go to the lab in the basement of our building to have lab work done as you leave today. Hit B for basement when you get on the elevator.  When the doors open the lab is on your left.  We will call you with the results. Thank you.  Remain on a clear liquid diet.  Drink Gatorade or Pedialyte.  Thank you for entrusting me with your care and for choosing [facility], Dr. Elspeth Naval  If your blood pressure at your visit was 140/90 or greater, please contact your primary care physician to follow up on this. ______________________________________________________  If you are age 90 or older, your body mass index should be between 23-30. Your Body mass index is 41.2 kg/m. If this is out of the aforementioned range listed, please consider follow up with your Primary Care Provider.  If you are age 39 or younger, your body mass index should be between 19-25. Your Body mass index is 41.2 kg/m. If this is out of the aformentioned range listed, please consider follow up with your Primary Care Provider.  ________________________________________________________  The [facility] providers would like to encourage you to use MYCHART to communicate with providers for non-urgent requests or questions.  Due to long hold times on the telephone, sending your provider a message by [facility] may be a faster and more efficient way to get a response.  Please allow 48 business hours for a response.  Please remember that this is for non-urgent requests.  _______________________________________________________  Due to recent changes in healthcare laws, you may see the results of your imaging and laboratory studies on MyChart before your provider has had a chance to review them.  We understand that in some cases there may be results that are confusing or concerning to you. Not all laboratory results come back in the same time frame and the provider may be waiting for multiple results in order to interpret  others.  Please give us  48 hours in order for your provider to thoroughly review all the results before contacting the office for clarification of your results.

## 2023-03-29 NOTE — Telephone Encounter (Signed)
 Called and spoke to patient. Let her know her labs came back normal, hemoglobin is better.   Dr. Leigh is recommending colonoscopy and he has an opening tomorrow in the [facility].  She  said she is having more bleeding. Per  Dr. DELENA, if she is more comfortable she can go to the hospital if she doesn't want to wait until tomorrow for the procedure.  She agreed to wait and have colonoscopy tomorrow since her labs are good.  She is scheduled for 1-8.  Patient's husband will come to the office to get Sutab  sample and instructions today. Sample and instructions Left at front desk on third floor

## 2023-03-30 ENCOUNTER — Ambulatory Visit: Payer: Medicare Other | Admitting: Gastroenterology

## 2023-03-30 ENCOUNTER — Encounter: Payer: Self-pay | Admitting: Gastroenterology

## 2023-03-30 VITALS — BP 143/56 | HR 85 | Temp 97.3°F | Resp 15 | Ht 62.0 in | Wt 225.0 lb

## 2023-03-30 DIAGNOSIS — D120 Benign neoplasm of cecum: Secondary | ICD-10-CM

## 2023-03-30 DIAGNOSIS — K633 Ulcer of intestine: Secondary | ICD-10-CM

## 2023-03-30 DIAGNOSIS — K625 Hemorrhage of anus and rectum: Secondary | ICD-10-CM

## 2023-03-30 DIAGNOSIS — K5730 Diverticulosis of large intestine without perforation or abscess without bleeding: Secondary | ICD-10-CM

## 2023-03-30 MED ORDER — SODIUM CHLORIDE 0.9 % IV SOLN: 500.0000 mL | Freq: Once | INTRAVENOUS | Status: DC

## 2023-03-30 NOTE — Op Note (Signed)
 [facility] Patient Name: [name] Procedure Date: 03/30/2023 2:21 PM MRN: [id number] Endoscopist: Elspeth P. Leigh , MD, [id number] Age: 78 Referring MD:  Date of Birth: 10-Jun-1945 Gender: Female Account #: [id number] Procedure:                Colonoscopy Indications:              Rectal bleeding - history of lower GI bleed                            admitted in Sept, negative CTA and tagged RBC scan                            at that time. Bleeding stopped but then recurred                            this past week. Recent ED visit, Hgb in 12s, repeat                            CTA negative, she has continued to have                            intermittent bleeding. History of diverticulosis Medicines:                Monitored Anesthesia Care Procedure:                Pre-Anesthesia Assessment:                           - Prior to the procedure, a History and Physical                            was performed, and patient medications and                            allergies were reviewed. The patient's tolerance of                            previous anesthesia was also reviewed. The risks                            and benefits of the procedure and the sedation                            options and risks were discussed with the patient.                            All questions were answered, and informed consent                            was obtained. Prior Anticoagulants: The patient has                            taken no anticoagulant or antiplatelet agents. ASA  Grade Assessment: III - A patient with severe                            systemic disease. After reviewing the risks and                            benefits, the patient was deemed in satisfactory                            condition to undergo the procedure.                           After obtaining informed consent, the colonoscope                            was passed  under direct vision. Throughout the                            procedure, the patient's blood pressure, pulse, and                            oxygen  saturations were monitored continuously. The                            Olympus Scope SN [id number] was introduced through the                            anus and advanced to the the terminal ileum, with                            identification of the appendiceal orifice and IC                            valve. The colonoscopy was performed without                            difficulty. The patient tolerated the procedure                            well. The quality of the bowel preparation was                            adequate. The terminal ileum, ileocecal valve,                            appendiceal orifice, and rectum were photographed. Scope In: 2:34:09 PM Scope Out: 3:04:48 PM Scope Withdrawal Time: 0 hours 27 minutes 14 seconds  Total Procedure Duration: 0 hours 30 minutes 39 seconds  Findings:                 The perianal and digital rectal examinations were                            normal.  Very limited views of the terminal ileum appeared                            normal. It was not easily intubated.                           Many medium-mouthed diverticula were found in the                            entire colon.                           Two ulcers were found at the ileocecal valve. One                            was roughly 3-22mm in size and the largest was                            roughly 10mm or so. There was erythema on the                            lateral half of the ulcer with mild oozing.                            Biopsies were taken with a cold forceps from the                            upper portion of it for histology Two hemostatic                            clips were then placed across the lesion. It was                            fibrotic and first clip went on well across the                             lower left lateral area. Second clip attempted to                            approximate the middle of the ulcer and the edges                            but area was fibrotic and mostly adhered to the                            lateral side of the ulcer. There was no active                            bleeding at the end of the exam.                           The exam was otherwise without abnormality. There  was some old heme in the transverse and right colon                            that was lavaged. Complications:            No immediate complications. Estimated blood loss:                            Minimal. Estimated Blood Loss:     Estimated blood loss was minimal. Impression:               - The examined portion of the ileum was normal.                           - Diverticulosis in the entire examined colon.                           - Two ulcers at the ileocecal valve as outlined.                            Biopsied. Clips were placed as outlined.                           - The examination was otherwise normal.                           Suspect ulcer at the IC valve has caused some low                            grade bleeding, while diverticular bleed possible                            seems less likely. Unclear cause of ulcer. Biopsies                            taken. Need to avoid NSAIDs. Recommendation:           - Patient has a contact number available for                            emergencies. The signs and symptoms of potential                            delayed complications were discussed with the                            patient. Return to normal activities tomorrow.                            Written discharge instructions were provided to the                            patient.                           - Advance  diet as tolerated.                           - Continue present medications.                            - Avoid all NSAIDs                           - Await pathology results.                           - Monitor for recurrent bleeding Elspeth SQUIBB. Tamana Hatfield, MD 03/30/2023 3:18:41 PM This report has been signed electronically.

## 2023-03-30 NOTE — Progress Notes (Signed)
 Called to room to assist during endoscopic procedure.  Patient ID and intended procedure confirmed with present staff. Received instructions for my participation in the procedure from the performing physician.

## 2023-03-30 NOTE — Patient Instructions (Signed)
 Please read handouts provided. Continue present medications. Advance diet as tolerated. Avoid all NSAIDs. Await pathology results. Monitor for recurrent bleeding. Clip card.  YOU HAD AN ENDOSCOPIC PROCEDURE TODAY AT THE [facility]:   Refer to the procedure report that was given to you for any specific questions about what was found during the examination.  If the procedure report does not answer your questions, please call your gastroenterologist to clarify.  If you requested that your care partner not be given the details of your procedure findings, then the procedure report has been included in a sealed envelope for you to review at your convenience later.  YOU SHOULD EXPECT: Some feelings of bloating in the abdomen. Passage of more gas than usual.  Walking can help get rid of the air that was put into your GI tract during the procedure and reduce the bloating. If you had a lower endoscopy (such as a colonoscopy or flexible sigmoidoscopy) you may notice spotting of blood in your stool or on the toilet paper. If you underwent a bowel prep for your procedure, you may not have a normal bowel movement for a few days.  Please Note:  You might notice some irritation and congestion in your nose or some drainage.  This is from the oxygen  used during your procedure.  There is no need for concern and it should clear up in a day or so.  SYMPTOMS TO REPORT IMMEDIATELY:  Following lower endoscopy (colonoscopy or flexible sigmoidoscopy):  Excessive amounts of blood in the stool  Significant tenderness or worsening of abdominal pains  Swelling of the abdomen that is new, acute  Fever of 100F or higher.  For urgent or emergent issues, a gastroenterologist can be reached at any hour by calling [phone]. Do not use MyChart messaging for urgent concerns.    DIET:  We do recommend a small meal at first, but then you may proceed to your regular diet.  Drink plenty of fluids but you should  avoid alcoholic beverages for 24 hours.  ACTIVITY:  You should plan to take it easy for the rest of today and you should NOT DRIVE or use heavy machinery until tomorrow (because of the sedation medicines used during the test).    FOLLOW UP: Our staff will call the number listed on your records the next business day following your procedure.  We will call around 7:15- 8:00 am to check on you and address any questions or concerns that you may have regarding the information given to you following your procedure. If we do not reach you, we will leave a message.     If any biopsies were taken you will be contacted by phone or by letter within the next 1-3 weeks.  Please call us  at [phone] if you have not heard about the biopsies in 3 weeks.    SIGNATURES/CONFIDENTIALITY: You and/or your care partner have signed paperwork which will be entered into your electronic medical record.  These signatures attest to the fact that that the information above on your After Visit Summary has been reviewed and is understood.  Full responsibility of the confidentiality of this discharge information lies with you and/or your care-partner.

## 2023-03-30 NOTE — Progress Notes (Signed)
 Report to PACU, RN, vss, BBS= Clear.

## 2023-03-30 NOTE — Progress Notes (Signed)
 History and Physical Interval Note: See yesterday in the office. Colonoscopy to further evaluate rectal bleeding. Repeat Hgb yesterday was NORMAL.  Completed prep, had some bleeding with first half but she states it stopped, cleared. She wishes to proceed today after discussion of the risks / benefits   03/30/2023 2:26 PM  [name]  has presented today for endoscopic procedure(s), with the diagnosis of  Encounter Diagnosis  Name Primary?   Rectal bleeding Yes  .  The various methods of evaluation and treatment have been discussed with the patient and/or family. After consideration of risks, benefits and other options for treatment, the patient has consented to  the endoscopic procedure(s).   The patient's history has been reviewed, patient examined, no change in status, stable for surgery.  I have reviewed the patient's chart and labs.  Questions were answered to the patient's satisfaction.    Marcey Naval, MD [facility]

## 2023-03-30 NOTE — Progress Notes (Signed)
 Pt's states no medical or surgical changes since previsit or office visit.

## 2023-03-30 NOTE — Progress Notes (Deleted)
 Called to room to assist during endoscopic procedure.  Patient ID and intended procedure confirmed with present staff. Received instructions for my participation in the procedure from the performing physician.

## 2023-03-31 ENCOUNTER — Telehealth: Payer: Self-pay | Admitting: *Deleted

## 2023-03-31 NOTE — Telephone Encounter (Signed)
  Follow up Call-     03/30/2023    2:00 PM  Call back number  Post procedure Call Back phone  # [phone]  Permission to leave phone message Yes     Patient questions:  Do you have a fever, pain , or abdominal swelling? No. Pain Score  0 *  Have you tolerated food without any problems? Yes.    Have you been able to return to your normal activities? Yes.    Do you have any questions about your discharge instructions: Diet   No. Medications  No. Follow up visit  No.  Do you have questions or concerns about your Care? No.  Actions: * If pain score is 4 or above: No action needed, pain <4.

## 2023-04-04 ENCOUNTER — Encounter: Payer: Self-pay | Admitting: Gastroenterology

## 2023-04-04 LAB — SURGICAL PATHOLOGY

## 2023-04-07 DIAGNOSIS — K922 Gastrointestinal hemorrhage, unspecified: Secondary | ICD-10-CM | POA: Diagnosis not present

## 2023-04-07 DIAGNOSIS — K582 Mixed irritable bowel syndrome: Secondary | ICD-10-CM | POA: Diagnosis not present

## 2023-04-07 DIAGNOSIS — F3342 Major depressive disorder, recurrent, in full remission: Secondary | ICD-10-CM | POA: Diagnosis not present

## 2023-04-07 DIAGNOSIS — K633 Ulcer of intestine: Secondary | ICD-10-CM | POA: Diagnosis not present

## 2023-04-15 DIAGNOSIS — H5000 Unspecified esotropia: Secondary | ICD-10-CM | POA: Diagnosis not present

## 2023-04-15 DIAGNOSIS — Z961 Presence of intraocular lens: Secondary | ICD-10-CM | POA: Diagnosis not present

## 2023-04-15 DIAGNOSIS — H52203 Unspecified astigmatism, bilateral: Secondary | ICD-10-CM | POA: Diagnosis not present

## 2023-04-18 ENCOUNTER — Ambulatory Visit (INDEPENDENT_AMBULATORY_CARE_PROVIDER_SITE_OTHER): Payer: Medicare Other

## 2023-04-18 DIAGNOSIS — I480 Paroxysmal atrial fibrillation: Secondary | ICD-10-CM | POA: Diagnosis not present

## 2023-04-18 LAB — CUP PACEART REMOTE DEVICE CHECK
Date Time Interrogation Session: 20250126230508
Implantable Pulse Generator Implant Date: 20211103

## 2023-04-22 ENCOUNTER — Encounter: Payer: Self-pay | Admitting: Cardiovascular Disease

## 2023-04-25 NOTE — Progress Notes (Signed)
 [facility] Summary Report / Loop Recorder

## 2023-04-25 NOTE — Addendum Note (Signed)
Addended by: Geralyn Flash D on: 04/25/2023 10:26 AM   Modules accepted: Orders

## 2023-05-05 ENCOUNTER — Ambulatory Visit: Payer: Medicare Other | Admitting: Gastroenterology

## 2023-05-05 NOTE — Progress Notes (Deleted)
 HPI :  78 year old female here for follow-up visit for rectal bleeding.   I know her well for multiple issues over time.   Recall she had a history of GI bleeding in the past.  Was admitted to the hospital this past September with bright red blood per rectum.  She had a CTA initially that was negative for GI bleeding.  Her hemoglobin dropped 3 points during the hospital stay.  She had a tagged RBC scan which was also negative.  She eventually stopped bleeding on her own which was discharged.  Recall she has a history of A-fib but not currently on anticoagulation.   Recall her last colonoscopy was in 2021, significant diverticulosis.   She states that September she had not had any problems with blood in her stool until this past Sunday.  She woke up with red blood in her stool and on the tissue.  She states she is also passed brown stool with "dark clots".  She states whenever she wipes herself she has bright red blood per rectum and red blood leeches out of the darker clot.  Stool is brown in color.   She was seen in the ED on January 5.  Her hemoglobin was normal at 12, they did a CTA which was negative.  She had 1 episode of bleeding at that time and had no recurrence while monitored in the ED and they discharged her home.   She states no bleeding Sunday night, had a few episodes on Monday and another episode Monday night.  She has not had an episode of bleeding today.  She states again she has bright red blood on the toilet paper in the toilet and stool seems to be dark brown.  She denies any abdominal pains right now or rectal pain with this.  She had a rectal exam in the ED showing some old blood in the vault, no concerning pathology.  She does feel fatigued.  Again she remains off anticoagulation.   She states "every time I eat" it stimulates her to have a bowel movement.  If she states she does not eat she does not have a bowel movement and hence does not bleed.  She has been maintaining  hydration with drinking a lot of tea.   Most recent imaging workup: CTA 03/26/22: IMPRESSION: VASCULAR 1. No evidence for active gastrointestinal bleeding.    NON-VASCULAR   1. No acute localizing process in the abdomen or pelvis. 2. Colonic diverticulosis. 3. Small hiatal hernia. 4. Left Bosniak I benign renal cyst measuring 1.2 cm. No follow-up imaging recommended.       Tagged RBC scan 12/03/2022: FINDINGS: No abnormal areas of increased radiotracer uptake identified to indicate active GI bleeding. Normal physiologic distribution of the radiopharmaceutical is noted within the blood pool, liver, spleen and urinary bladder.   IMPRESSION: Examination is negative for active GI bleeding    CTA 11/30/2022: FINDINGS: VASCULAR   Aorta: Moderate partially calcified atheromatous plaque without aneurysm, dissection, for for   Celiac: Calcified ostial plaque without significant stenosis, patent distally.   SMA: Eccentric short-segment calcified ostial plaque without high-grade stenosis, patent distally with classic branch anatomy.   Renals: Single bilaterally, both with calcified ostial plaque resulting in mild stenosis, patent distally.   IMA: Patent without evidence of aneurysm, dissection, vasculitis or significant stenosis.   Inflow: Mild scattered calcified plaque in bilateral common and internal iliac arteries. No aneurysm or stenosis. External iliac arteries normal.   Proximal Outflow: Bilateral common femoral  and visualized portions of the superficial and profunda femoral arteries are patent without evidence of aneurysm, dissection, vasculitis or significant stenosis.   Veins: Patent hepatic veins, portal vein, SMV, splenic vein, bilateral renal veins, iliac venous system and IVC.   Review of the MIP images confirms the above findings.   NON-VASCULAR   Lower chest: No pleural or pericardial effusion. Visualized lung bases clear.   Hepatobiliary: No focal  liver abnormality is seen. Status post cholecystectomy. Ectatic central intrahepatic biliary tree and CBD, stable.   Pancreas: Unremarkable. No pancreatic ductal dilatation or surrounding inflammatory changes.   Spleen: Normal in size without focal abnormality.   Adrenals/Urinary Tract: Chronic linear right adrenal calcifications without mass. No urolithiasis. No hydronephrosis. Stable 1.5 cm left upper pole cyst; no follow-up recommended. Urinary bladder physiologically distended.   Stomach/Bowel: No active GI bleed. Moderate hiatal hernia. Post gastric bypass surgery. Small bowel decompressed. Appendix not identified. The colon is partially distended with multiple scattered diverticula throughout, most numerous in the distal descending and sigmoid segments, without adjacent inflammatory change.   Lymphatic: No abdominal or pelvic adenopathy.   Reproductive: Status post hysterectomy. No adnexal masses.   Other: No ascites.  No free air.   Musculoskeletal: Mild spondylitic changes throughout the visualized lower thoracic and lumbar spine. Bilateral hip arthroplasty components project in expected location. No fracture or worrisome bone lesion.   IMPRESSION: 1. No active GI bleed. 2. Colonic diverticulosis without acute inflammation. 3. Moderate hiatal hernia. 4. Aortic Atherosclerosis     MOST RECENT GI PROCEDURES:   Colonoscopy 10/18/2019 by Dr. Adela Lank: - The examined portion of the ileum was normal.  - Diverticulosis in the entire examined colon. - Three 2 to 3 mm polyps in the transverse colon, removed with a cold biopsy forceps. Resected and retrieved.  - Internal hemorrhoids, small.  - The examination was otherwise normal. Brown stool throughout, no heme appreciated. Suspect the patient has had low grade stuttering diverticular bleed, hopefully resolved with holding Eliquis for the past 5 days.  -Consider a repeat colonoscopy in 5 to 7 years   Surgical [P],  colon, transverse, polyps (3) - TUBULAR ADENOMA (5 OF 6 FRAGMENTS) - BENIGN COLONIC MUCOSA (1 OF 6 FRAGMENTS) - NO HIGH GRADE DYSPLASIA OR MALIGNANCY IDENTIFIED   EGD 09/20/2019: - Esophagogastric landmarks identified.  - 3 cm hiatal hernia.  - Normal esophagus otherwise  - Empiric dilation performed at 17mm and 18mm as above  - A sleeve gastrectomy was found, characterized by healthy appearing mucosa.  - Normal duodenal bulb and second portion of the duodenum.   EGD 04/15/2017: - Esophagogastric landmarks identified.  - Normal esophagus - A sleeve gastrectomy was found, characterized by healthy appearing mucosa.  - Erythematous mucosa in the antrum.  - Normal duodenal bulb and second portion of the duodenum.  - Biopsies were taken with a cold forceps for Helicobacter pylori testing.   Colonoscopy 04/15/2017: - The examined portion of the ileum was normal.  - Diverticulosis in the entire examined colon.  - The examination was otherwise normal on direct and retroflexion views.  - Biopsies were taken with a cold forceps from the right colon, left colon and transverse colon for evaluation of microscopic colitis.   78 y.o. female here for assessment of the following   1. Rectal bleeding   2. Diverticular disease of colon     Admitted in September with a suspected diverticular bleed that take a few days to resolve.  She had a negative CTA and  tagged RBC scan at that time.  Bleeding had resolved and then she had recurrence this past Sunday night on January 5.  Seen in the ED with a negative CTA again and stable hemoglobin in the 12's.  Discharged from the ED.  She has had a few more bowel movements, mostly bright red blood with dark brown stool.  Her BUN was normal in the ED.   This sounds very much like stuttering diverticular bleeding versus anorectal bleeding.  I doubt this is an upper GI bleed based on her description of symptoms today.   We discussed options moving forward.  I hope  with more time this may resolve on its own.  I will send her to the lab today for stat labs.  If she has had any significant drop in her hemoglobin she will need to be admitted to the hospital.  If her hemoglobin remains normal we will continue to monitor.  I recommend she stay on a clear liquid diet for now and drink plenty of fluids with either Gatorade or Pedialyte.  Her blood pressure stable, her heart rate is stable in the 90s.  I do think we may need to repeat a colonoscopy for her in the next few days.  If her hemoglobin is stable we can try to do a colonoscopy tomorrow or Thursday in outpatient setting.  They would really prefer to avoid being hospitalized if bleeding is mild and hemoglobin is stable.  If she has any significant bleeding again she will need to be hospitalized for this workup.   In the interim while we are sorting this out, if she has any significant bleeding she will need to go back to the ED.  Hopefully hemoglobin remained stable at this time. No bleeding so far today. If this is diverticular hopefully this improves with more time.    PLAN: - Hgb and BUN STAT to determine if she needs to be readmitted to the hospital or not. - clear liquid diet - drink gatorade / pedialyte instead of water or tea if she is on clear liquids. - possible colonoscopy at [facility] in the next 1-2 days pending her course and if stable for outpatient workup. She really wants to remain out of the hospital but if any significant bleeding will need admission. She understands this. Currently vitals are stable and no further bleeding today so far    Rectal bleeding - history of lower GI bleed admitted in Sept, negative CTA and tagged RBC scan at that time. Bleeding stopped but then recurred this past week. Recent ED visit, Hgb in 12s, repeat CTA negative, she has continued to have intermittent bleeding. History of diverticulosis Colonoscopy 03/30/2023: - The perianal and digital rectal examinations were normal. -  Very limited views of the terminal ileum appeared normal. It was not easily intubated. - Many medium-mouthed diverticula were found in the entire colon. - Two ulcers were found at the ileocecal valve. One was roughly 3-40mm in size and the largest was roughly 10mm or so. There was erythema on the lateral half of the ulcer with mild oozing. Biopsies were taken with a cold forceps from the upper portion of it for histology Two hemostatic clips were then placed across the lesion. It was fibrotic and first clip went on well across the lower left lateral area. Second clip attempted to approximate the middle of the ulcer and the edges but area was fibrotic and mostly adhered to the lateral side of the ulcer. There was no active  bleeding at the end of the exam. - The exam was otherwise without abnormality. There was some old heme in the transverse and right colon that was lavaged.  1. Surgical [P], small bowel, ileocecal valve ulcer :       -  FRAGMENT OF GRANULATION TISSUE WITH REACTIVE/REPARATIVE CHANGES, CONSISTENT       WITH ULCER.       -  SEPARATE FRAGMENTS OF INTESTINAL MUCOSA (PREDOMINANTLY COLONIC) WITH MILD       HYPERPLASTIC CHANGE, OTHERWISE NO SIGNIFICANT PATHOLOGY.       -  NEGATIVE FOR DEFINITIVE EVIDENCE OF CHRONICITY, GRANULOMAS, VIRAL CYTOPATHIC       EFFECT AND DYSPLASIA.    [place] can you help relay the following to the patient: - biopsies came back as a benign ulcer.  There is no precancerous change here which is good news.  There is no evidence of IBD or viral infection otherwise to cause the ulcer.  It remains unclear to me why she has this ulcer here but hopefully it will heal with time. - can you clarify if she has had any further rectal bleeding.  If not we will continue to monitor for now.  If she continues to have periodic rectal bleeding let me know, and to repeat her blood counts to make sure stable and we would check a hemoglobin.  Could also consider capsule endoscopy to clear  her small intestine should she continue to have recurrent bleeding.  Hopefully again this has resolved and if that is the case we may just monitor her for recurrent symptoms for now.   I would like you to book her a follow-up with me in the next 2 months or so for reassessment to review findings and her course.  Thanks     Past Medical History:  Diagnosis Date   Allergy    Anemia    Antral polyp    benign   Anxiety    Arthritis    Asthma    B12 deficiency    Back pain    Chest pain    Constipation    Depression    Diverticulosis    Gallbladder problem    GERD (gastroesophageal reflux disease) 09/09/2011   History of transfusion    HTN (hypertension) 09/09/2011   Hyperlipemia 09/09/2011   Hypertension    IBS (irritable bowel syndrome)    Internal hemorrhoid    Joint pain    Obesity    Osteoarthritis    Paroxysmal atrial fibrillation (HCC)    Pneumonia    PONV (postoperative nausea and vomiting)    with big surgeries, none with endos etc, with colonoscopy- 5-6 years ago could not swallow    Shortness of breath    Sleep apnea    borderline per patient no cpap    SOB (shortness of breath) on exertion    Swallowing difficulty    Tachycardia    Unstable angina (HCC) 09/09/2011   Vitamin D deficiency    Vocal cord dysfunction 2013   swelling     Past Surgical History:  Procedure Laterality Date   ABDOMINAL HYSTERECTOMY  1988   ANKLE FUSION Right    x 2   BACK SURGERY  [phone]   3 ruptured discs, lower back   BILATERAL ANTERIOR TOTAL HIP ARTHROPLASTY Bilateral 11/12/2014   Procedure: BILATERAL ANTERIOR TOTAL HIP ARTHROPLASTY;  Surgeon: Durene Romans, MD;  Location: [facility];  Service: Orthopedics;  Laterality: Bilateral;   BRAVO PH STUDY [name] 03/06/2013  Procedure: BRAVO PH STUDY;  Surgeon: Louis Meckel, MD;  Location: [facility];  Service: Endoscopy;  Laterality: [name];   CARDIAC CATHETERIZATION  2013   NORMAL   CHOLECYSTECTOMY     COLONOSCOPY     ELBOW  SURGERY Left    removed bone chip   ESOPHAGOGASTRODUODENOSCOPY [name] 03/06/2013   Procedure: ESOPHAGOGASTRODUODENOSCOPY (EGD);  Surgeon: Louis Meckel, MD;  Location: [facility];  Service: Endoscopy;  Laterality: [name];   FOOT OSTEOTOMY W/ PLANTAR FASCIA RELEASE Left    ILIOTIBIAL BAND RELEASE Right 1997   implantable loop recorder placement  01/23/2020   Medtronic Reveal Linq model LNQ 22 (RLB [id number]) implantable loop recorder   JOINT REPLACEMENT     LAPAROSCOPIC GASTRIC SLEEVE RESECTION WITH HIATAL HERNIA REPAIR  08/20/2014   Procedure: LAPAROSCOPIC GASTRIC SLEEVE RESECTION WITH HIATAL HERNIA  REPAIR AND  UPPER ENDOSCOPY;  Surgeon: Luretha Murphy, MD;  Location: [facility];  Service: General;;   LEFT HEART CATHETERIZATION WITH CORONARY ANGIOGRAM Bilateral 09/10/2011   Procedure: LEFT HEART CATHETERIZATION WITH CORONARY ANGIOGRAM;  Surgeon: Runell Gess, MD;  Location: [facility] C[facility] LAB;  Service: Cardiovascular;  Laterality: Bilateral;   NASAL SINUS SURGERY  1997   REVERSE SHOULDER ARTHROPLASTY Right 05/14/2016   Procedure: RIGHT REVERSE SHOULDER ARTHROPLASTY;  Surgeon: Beverely Low, MD;  Location: [facility] OR;  Service: Orthopedics;  Laterality: Right;   REVERSE SHOULDER ARTHROPLASTY Left 05/16/2020   Procedure: REVERSE SHOULDER ARTHROPLASTY;  Surgeon: Beverely Low, MD;  Location: [facility];  Service: Orthopedics;  Laterality: Left;  interscalene exparel   SHOULDER SURGERY Left    tfc wrist Left 1997   thumb replacement Bilateral    one 2009 and one 2014   TOTAL ANKLE REPLACEMENT Right    x 3   TOTAL ANKLE REPLACEMENT Left    TOTAL ANKLE REPLACEMENT     TOTAL HIP ARTHROPLASTY Bilateral    TOTAL KNEE ARTHROPLASTY Bilateral    left 2002, right 2012   TOTAL SHOULDER ARTHROPLASTY Right 05/14/2016   TRANSTHORACIC ECHOCARDIOGRAM  09/09/2011   MILD CONCENTRIC LVH. TRACE MITRAL REGURG. TRACE TR. MILD AORTIC REGURG.   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   UPPER GI ENDOSCOPY  08/20/2014   Procedure:  UPPER GI ENDOSCOPY;  Surgeon: Luretha Murphy, MD;  Location: [facility];  Service: General;;   WRIST SURGERY Left 1997, 2009    Family History  Problem Relation Age of Onset   Hypertension Mother    Diabetes Mother    Arthritis Mother    Esophageal cancer Mother    Cancer Mother    Esophageal cancer Father 38   Cancer Father    Alcoholism Father    Other Sister        kidney mass removed   Hypertension Sister    Stroke Sister        died in 12-09-2018  Heart disease Maternal Grandmother    Stroke Paternal Grandmother    Coronary artery disease Neg Hx    Colon cancer Neg Hx    Liver disease Neg Hx    Kidney disease Neg Hx    Rectal cancer Neg Hx    Stomach cancer Neg Hx    Social History   Tobacco Use   Smoking status: Never   Smokeless tobacco: Never  Vaping Use   Vaping status: Never Used  Substance Use Topics   Alcohol use: No   Drug use: No   Current Outpatient Medications  Medication Sig Dispense Refill  acetaminophen (TYLENOL) 500 MG tablet Take 1,000 mg by mouth every 6 (six) hours as needed (for pain.).      albuterol (PROVENTIL HFA;VENTOLIN HFA) 108 (90 Base) MCG/ACT inhaler Inhale 2 puffs into the lungs every 6 (six) hours as needed for shortness of breath or wheezing.     ALPRAZolam (XANAX) 0.25 MG tablet Take 0.25 mg by mouth daily as needed for anxiety.     BIOTIN PO Take 1 tablet by mouth daily.     cetirizine (ZYRTEC) 10 MG chewable tablet Chew 10 mg by mouth daily.     Cholecalciferol (DIALYVITE VITAMIN D 5000) 125 MCG (5000 UT) capsule Take 5,000 Units by mouth 2 (two) times daily.     cyclobenzaprine (FLEXERIL) 10 MG tablet Take 10 mg by mouth 3 (three) times daily as needed for muscle spasms.     diltiazem (CARDIZEM CD) 240 MG 24 hr capsule TAKE 1 CAPSULE BY MOUTH EVERY DAY (Patient taking differently: Take 240 mg by mouth daily.) 90 capsule 3   famotidine (PEPCID) 20 MG tablet Take 20 mg by mouth 2 (two) times daily.     loratadine (CLARITIN) 10  MG tablet Take 10 mg by mouth daily.     losartan (COZAAR) 50 MG tablet Take 50 mg by mouth daily.     Melatonin 10 MG TABS Take 10 mg by mouth at bedtime.     Multiple Vitamin (MULTIVITAMIN WITH MINERALS) TABS tablet Take 1 tablet by mouth daily. Centrum     ondansetron (ZOFRAN-ODT) 8 MG disintegrating tablet DISSOLVE 1 TABLET IN MOUTH EVERY 8 HRS AS NEEDED NAUSEA 30 tablet 1   pantoprazole (PROTONIX) 40 MG tablet Take 1 tablet (40 mg total) by mouth 2 (two) times daily before a meal. 60 tablet 3   simvastatin (ZOCOR) 20 MG tablet Take 1 tablet (20 mg total) by mouth daily. 30 tablet 11   vitamin B-12 (CYANOCOBALAMIN) 1000 MCG tablet Take 1,000 mcg by mouth daily.     No current facility-administered medications for this visit.   Allergies  Allergen Reactions   Ativan [Lorazepam] Other (See Comments)    Agitation, aggressive actions, significant mental changes   Cefdinir Hives    headache   Buprenorphine Hcl Itching and Rash     Review of Systems: All systems reviewed and negative except where noted in HPI.    CUP PACEART REMOTE DEVICE CHECK Result Date: 04/18/2023 ILR summary report received. Battery status OK. Normal device function. No new symptom, tachy, brady, or pause episodes. No new AF episodes. Minimal AF detections noted on trends below detection thresholds.  When in AF, v. rates are > 100 bpm.  Known history of PAF with no recurrence, no OAC per Epic. Monthly summary reports and ROV/PRN - CS, CVRS   Physical Exam: There were no vitals taken for this visit. Constitutional: Pleasant,well-developed, ***female in no acute distress. HEENT: Normocephalic and atraumatic. Conjunctivae are normal. No scleral icterus. Neck supple.  Cardiovascular: Normal rate, regular rhythm.  Pulmonary/chest: Effort normal and breath sounds normal. No wheezing, rales or rhonchi. Abdominal: Soft, nondistended, nontender. Bowel sounds active throughout. There are no masses palpable. No  hepatomegaly. Extremities: no edema Lymphadenopathy: No cervical adenopathy noted. Neurological: Alert and oriented to person place and time. Skin: Skin is warm and dry. No rashes noted. Psychiatric: Normal mood and affect. Behavior is normal.   ASSESSMENT: 78 y.o. female here for assessment of the following  No diagnosis found.  PLAN:   Cleatis Polka., MD

## 2023-05-07 ENCOUNTER — Other Ambulatory Visit: Payer: Self-pay | Admitting: Nurse Practitioner

## 2023-05-23 ENCOUNTER — Ambulatory Visit (INDEPENDENT_AMBULATORY_CARE_PROVIDER_SITE_OTHER): Payer: Medicare Other

## 2023-05-23 DIAGNOSIS — I480 Paroxysmal atrial fibrillation: Secondary | ICD-10-CM

## 2023-05-24 LAB — CUP PACEART REMOTE DEVICE CHECK
Date Time Interrogation Session: 20250302230306
Implantable Pulse Generator Implant Date: 20211103

## 2023-05-25 ENCOUNTER — Encounter: Payer: Self-pay | Admitting: Cardiovascular Disease

## 2023-05-30 NOTE — Progress Notes (Signed)
 [facility] Summary Report / Loop Recorder

## 2023-06-01 ENCOUNTER — Ambulatory Visit: Payer: Medicare Other

## 2023-06-01 ENCOUNTER — Ambulatory Visit (INDEPENDENT_AMBULATORY_CARE_PROVIDER_SITE_OTHER): Payer: Medicare Other | Admitting: Psychology

## 2023-06-01 DIAGNOSIS — G3184 Mild cognitive impairment, so stated: Secondary | ICD-10-CM | POA: Diagnosis not present

## 2023-06-01 DIAGNOSIS — R4189 Other symptoms and signs involving cognitive functions and awareness: Secondary | ICD-10-CM

## 2023-06-01 DIAGNOSIS — R413 Other amnesia: Secondary | ICD-10-CM

## 2023-06-01 NOTE — Progress Notes (Signed)
   Psychometrician Note   Cognitive testing was administered to [name] by Shan Levans, B.S. (psychometrist) under the supervision of Dr. Annice Pih, Psy.D., licensed psychologist on 06/01/2023. [name] did not appear overtly distressed by the testing session per behavioral observation or responses across self-report questionnaires. Rest breaks were offered.    The battery of tests administered was selected by Dr. Annice Pih, Psy.D. with consideration to [name]'s current level of functioning, the nature of her symptoms, emotional and behavioral responses during interview, level of literacy, observed level of motivation/effort, and the nature of the referral question. This battery was communicated to the psychometrist. Communication between Dr. Annice Pih, Psy.D. and the psychometrist was ongoing throughout the evaluation and Dr. Annice Pih, Psy.D. was immediately accessible at all times. Dr. Annice Pih, Psy.D. provided supervision to the psychometrist on the date of this service to the extent necessary to assure the quality of all services provided.    [name] will return within approximately 1-2 weeks for an interactive feedback session with Dr. Robbie Lis at which time her test performances, clinical impressions, and treatment recommendations will be reviewed in detail. [name] understands she can contact our office should she require our assistance before this time.  A total of 130 minutes of billable time were spent face-to-face with [name] by the psychometrist. This includes both test administration and scoring time. Billing for these services is reflected in the clinical report generated by Dr. Annice Pih, Psy.D.  This note reflects time spent with the psychometrician and does not include test scores or any clinical interpretations made by Dr. Robbie Lis. The full report will follow in a separate note.

## 2023-06-01 NOTE — Progress Notes (Unsigned)
 NEUROPSYCHOLOGICAL EVALUATION [place]. [facility]  [place] Department of Neurology  Date of Evaluation: 06/01/2023  REASON FOR REFERRAL   [name] is a*** ***-year-old, {KDEISSHANDEDNESS:32240}-handed, *** female with *** years of formal education. She was referred for neuropsychological evaluation by her neurologist, {KDEISSPROVIDERS1:32312} to assess current neurocognitive functioning, document potential cognitive deficits, and assist with treatment planning. This is her first neuropsychological evaluation.  She previously underwent neuropsychological evaluation on *** at ***. {KDEISSPREVIOUSNP:32242}  SUMMARY OF RESULTS   ***  DIAGNOSTIC IMPRESSION   Results of the current evaluation ***  ICD-10 Codes: ***  RECOMMENDATIONS   A repeat neuropsychological evaluation in *** months (or sooner if functional decline is noted) is recommended.  ***In consultation with your doctor, schedule cognitive reevaluation on an as-needed basis to assess for cognitive decline and update treatment recommendations. Reevaluation should occur during a period of medical and affective stability.  ***Discuss with your neurologist the risks and benefits of starting a medication that can help slow memory decline.  ***Patient has already been prescribed a medication (i.e., ***) aimed at addressing memory loss and concerns surrounding Alzheimer's disease. She is encouraged to continue taking this medication as prescribed. It is important to highlight that this medication has been shown to slow functional decline in some individuals.  ***Consider additional brain imaging. FDG-PET scan may be helpful to characterize regional hypometabolism suggestive of underlying  pathology.  {KDEISSDRIVINGRECS:32201}  {KDEISSDEMENTIARECS:32187}  {KDEISSHEALTHYLIVINGRECS:32196}  {KDEISSPSYCHRECS:32202}  {KDEISSREFERRALRECS:32193}  {KDEISSSLEEPRECS:32198}  {KDEISSSUBSTANCERECS:32199}  {KDEISSSENSORYRECS:32189}  {KDEISSACPRECS (Optional):32200}  {KDEISSCOGRECS:32216}  {KDEISSCAREGIVINGRECS:32194}  {KdeissRecommendations:32183}  DISPOSITION   Patient will follow up with the referring provider, {KDEISSNEUROLOGISTS:32243}. {KDEISSFOLLOWUP:32247}. She and *** will be provided verbal feedback in approximately one week regarding the findings and impression during this visit.  The remainder of the report includes the details of the patient's background and a table of results from the current evaluation, which support the summary and recommendations described above.  BACKGROUND   History of Presenting Illness: The following information was obtained following a review of medical records and an interview with the patient and ***. ***  Previous Neuropsychological Evaluation(s): ***  Cognitive Functioning: During today's appointment, the patient reported ***.  Physical Functioning: Patient denied difficulties with sleep initiation and maintenance. Appetite is stable. No changes to sense of taste or smell were reported. Vision and hearing are stable. ***RBD, falls, tremor, speech, incontinence, pain***  Emotional Functioning: Patient reported her current mood as "***." ***  Imaging: MRI of the brain (***) documented ***.  Other Relevant Medical History: Remarkable for ***. No history of stroke, CNS infection, head injury, or seizure was reported.***  Current Medications: Per patient, ***.  The patient denied taking medications.*** At this time, she reports only taking vitamins and supplements.***  Functional Status: Patient independently performs all ADLs and IADLs, including driving, without difficulty.***  Family Neurological History: Remarkable for  ***  Psychiatric History: Remarkable for ***.  History of depression, anxiety, prior mental health treatment, suicidal ideation, hallucinations, and psychiatric hospitalizations was not reported.  Substance Use History: {KDEISSSUBSTANCES:32250}  Social and Developmental History: Patient was born in ***. History of perinatal complications and developmental delays was not reported. Language ***. Patient is *** and has *** children who live locally. Patient resides in ***.  Educational and Occupational History: No history of childhood learning disability, special education services, or grade retention was reported. Patient completed *** or obtained a *** degree. Patient was employed as ***. Patient retired in [employer]   BEHAVIORAL OBSERVATIONS   Patient arrived  on time and was accompanied by her husband, Jonny Ruiz. Gait was slow and unsteady, so the patient's husband had to help walk her between rooms. She was alert and oriented to self and place but not to time (i.e., stated it was sometime after Easter 2025) . She was appropriately groomed and dressed for the setting. No significant sensory or motor abnormalities were observed. Vision (corrected) and hearing (corrected) were adequate for testing purposes. Speech was of normal rate, prosody, and volume. No conversational word-finding difficulties, paraphasic errors, or dysarthria were observed. Comprehension was conversationally intact. Thought processes were linear, logical, and coherent. Thought content was organized and devoid of delusions. Insight appeared intact. Affect was tearful due to test anxiety***. She was cooperative and gave adequate effort during testing, including on standalone and embedded measures of performance validity***. Results are thought to accurately reflect her cognitive functioning at this time.  NEUROPSYCHOLOGICAL TESTING RESULTS   Tests Administered: {KDEISSNPTESTS:32266}  Test results are provided in the table below. Whenever  possible, the patient's scores were compared against age-, sex-, and education-corrected normative samples. Interpretive descriptions are based on the AACN consensus conference statement on uniform labeling (Guilmette et al., 2020).  ***  INFORMED CONSENT   Patient was provided with a verbal description of the nature and purpose of the neuropsychological evaluation. Also reviewed were the foreseeable risks and/or discomforts and benefits of the procedure, limits of confidentiality, and mandatory reporting requirements of this provider. Patient was given the opportunity to have their questions answered. Oral consent to participate was provided by the patient.   This report was prepared as part of a clinical evaluation and is not intended for forensic use.  SERVICE   This evaluation was conducted by Annice Pih, Psy.D. In addition to time spent directly with the patient, total professional time includes record review, integration of relevant medical history, test selection, interpretation of findings, and report preparation. A technician, {KDEISSTECHS:32253}, provided testing and scoring assistance for *** minutes.  Psychiatric Diagnostic Evaluation Services (Professional): 41324 x 1 Neuropsychological Testing Evaluation Services (Professional): 40102 x 1 Neuropsychological Testing Evaluation Services (Professional): 72536 x *** Neuropsychological Test Administration and Scoring (Technician): [phone] x 1 Neuropsychological Test Administration and Scoring (Technician): [phone] x ***  This report was generated using voice recognition software. While this document has been carefully reviewed, transcription errors may be present. I apologize in advance for any inconvenience. Please contact me if further clarification is needed.            Annice Pih, Psy.D.             Neuropsychologist

## 2023-06-08 ENCOUNTER — Other Ambulatory Visit: Payer: Self-pay | Admitting: Gastroenterology

## 2023-06-08 ENCOUNTER — Ambulatory Visit: Payer: Medicare Other | Admitting: Psychology

## 2023-06-08 DIAGNOSIS — G3184 Mild cognitive impairment, so stated: Secondary | ICD-10-CM | POA: Diagnosis not present

## 2023-06-08 DIAGNOSIS — R413 Other amnesia: Secondary | ICD-10-CM

## 2023-06-08 NOTE — Progress Notes (Signed)
   NEUROPSYCHOLOGY FEEDBACK SESSION [place]. [facility] [place]  [place] Department of Neurology  Date of Feedback Session: 06/08/2023  REASON FOR REFERRAL   Kenndra Morris is a 78 year old, left-handed, White female with 12 years of formal education. She was referred for neuropsychological evaluation by Marlowe Kays, PA-C, to assess current neurocognitive functioning, document potential cognitive deficits, and assist with treatment planning.  FEEDBACK   Patient completed a comprehensive neuropsychological evaluation on 06/01/2023. Please refer to that encounter for the full report and recommendations. Briefly, results indicated cognitive deficits in multiple domains, with the most prominent impairments being in memory and visuospatial skills. While overall functioning remains largely preserved, there are concerns about potential decline. Considering her cognitive and functional status, findings are at the threshold between a diagnosis of advanced mild cognitive impairment and mild dementia. 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 The overall clinical picture, including the insidious onset of symptoms, difficulties with orientation, amnestic memory profile, and visuospatial impairments, raises concern for a underlying Alzheimer's disease process. Other possible etiological contributors include hearing loss, anxiety, chronic cerebrovascular disease noted on MRI, and the possibility of untreated sleep apnea.  Today, the patient was accompanied by her husband, Jonny Ruiz. They were provided verbal feedback regarding the findings and impression during this visit, and their questions were answered. A copy of the report was provided at the conclusion of the visit.  Of note, the patient's husband reported that the patient has been taking memantine despite prior  reports that it was stopped.   DISPOSITION   Patient will follow up with the referring provider, Ms. Wertman. She should return for repeat neuropsychological testing in one year to monitor her course and assist with diagnosis and treatment planning.   SERVICE   This feedback session was conducted by Annice Pih, Psy.D. [place] unit of [id number] was billed for Dr. Robbie Lis' time spent in preparing, conducting, and documenting the current feedback session.  This report was generated using voice recognition software. While this document has been carefully reviewed, transcription errors may be present. I apologize in advance for any inconvenience. Please contact me if further clarification is needed.

## 2023-06-27 ENCOUNTER — Ambulatory Visit (INDEPENDENT_AMBULATORY_CARE_PROVIDER_SITE_OTHER): Payer: Medicare Other

## 2023-06-27 DIAGNOSIS — I480 Paroxysmal atrial fibrillation: Secondary | ICD-10-CM

## 2023-06-27 NOTE — Progress Notes (Signed)
 [facility] Summary Report / Loop Recorder

## 2023-06-27 NOTE — Addendum Note (Signed)
 Addended by: Geralyn Flash D on: 06/27/2023 09:46 AM   Modules accepted: Orders

## 2023-06-28 LAB — CUP PACEART REMOTE DEVICE CHECK
Date Time Interrogation Session: 20250406230304
Implantable Pulse Generator Implant Date: 20211103

## 2023-06-30 ENCOUNTER — Encounter: Payer: Self-pay | Admitting: Physician Assistant

## 2023-06-30 ENCOUNTER — Ambulatory Visit (INDEPENDENT_AMBULATORY_CARE_PROVIDER_SITE_OTHER): Payer: Medicare Other | Admitting: Physician Assistant

## 2023-06-30 VITALS — BP 134/76 | HR 93 | Resp 20 | Ht 62.0 in | Wt 222.0 lb

## 2023-06-30 DIAGNOSIS — G3184 Mild cognitive impairment, so stated: Secondary | ICD-10-CM | POA: Diagnosis not present

## 2023-06-30 MED ORDER — MEMANTINE HCL 10 MG PO TABS: 10.0000 mg | ORAL_TABLET | Freq: Two times a day (BID) | ORAL | 11 refills | Status: DC

## 2023-06-30 NOTE — Patient Instructions (Addendum)
 It was a pleasure to see you today at our office.   Recommendations:   Continue  Memantine 10  mg at night  Continue B12  Take care of the iron deficiency anemia  Recommend no driving  For psychiatric meds, mood meds: Please have your primary care physician manage these medications.  If you have any severe symptoms of a stroke, or other severe issues such as confusion,severe chills or fever, etc call 911 or go to the ER as you may need to be evaluated further    For assessment of decision of mental capacity and competency:  Call Dr. Erick Blinks, geriatric psychiatrist at [phone]  Counseling regarding caregiver distress, including caregiver depression, anxiety and issues regarding community resources, adult day care programs, adult living facilities, or memory care questions:  please contact your  Primary Doctor's Social Worker   Whom to call: Memory  decline, memory medications: Call our office [phone]    [email or web address]   RECOMMENDATIONS FOR ALL PATIENTS WITH MEMORY PROBLEMS: 1. Continue to exercise (Recommend 30 minutes of walking everyday, or 3 hours every week) 2. Increase social interactions - continue going to [place] and enjoy social gatherings with friends and family 3. Eat healthy, avoid fried foods and eat more fruits and vegetables 4. Maintain adequate blood pressure, blood sugar, and blood cholesterol level. Reducing the risk of stroke and cardiovascular disease also helps promoting better memory. 5. Avoid stressful situations. Live a simple life and avoid aggravations. Organize your time and prepare for the next day in anticipation. 6. Sleep well, avoid any interruptions of sleep and avoid any distractions in the bedroom that may interfere with adequate sleep quality 7. Avoid sugar, avoid sweets as there is a strong link between excessive sugar intake, diabetes, and cognitive impairment We discussed  the Mediterranean diet, which has been shown to help patients reduce the risk of progressive memory disorders and reduces cardiovascular risk. This includes eating fish, eat fruits and green leafy vegetables, nuts like almonds and hazelnuts, walnuts, and also use olive oil. Avoid fast foods and fried foods as much as possible. Avoid sweets and sugar as sugar use has been linked to worsening of memory function.  There is always a concern of gradual progression of memory problems. If this is the case, then we may need to adjust level of care according to patient needs. Support, both to the patient and caregiver, should then be put into place.      You have been referred for a neuropsychological evaluation (i.e., evaluation of memory and thinking abilities). Please bring someone with you to this appointment if possible, as it is helpful for the doctor to hear from both you and another adult who knows you well. Please bring eyeglasses and hearing aids if you wear them.    The evaluation will take approximately 3 hours and has two parts:   The first part is a clinical interview with the neuropsychologist (Dr. Milbert Coulter or Dr. Roseanne Reno). During the interview, the neuropsychologist will speak with you and the individual you brought to the appointment.    The second part of the evaluation is testing with the doctor's technician Annabelle Harman or Selena Batten). During the testing, the technician will ask you to remember different types of material, solve problems, and answer some questionnaires. Your family member will not be present for this portion of the evaluation.   Please note: We must reserve several hours of the neuropsychologist's time and the psychometrician's time for your evaluation appointment. As such, there is  a No-Show fee of $100. If you are unable to attend any of your appointments, please contact our office as soon as possible to reschedule.      DRIVING: Regarding driving, in patients with progressive memory  problems, driving will be impaired. We advise to have someone else do the driving if trouble finding directions or if minor accidents are reported. Independent driving assessment is available to determine safety of driving.   If you are interested in the driving assessment, you can contact the following:  [facility] [employer] in [place] [phone]  [facility] [phone]  [facility] [phone]  [facility] [phone] or [phone]   FALL PRECAUTIONS: Be cautious when walking. Scan the area for obstacles that may increase the risk of trips and falls. When getting up in the mornings, sit up at the edge of the bed for a few minutes before getting out of bed. Consider elevating the bed at the head end to avoid drop of blood pressure when getting up. Walk always in a well-lit room (use night lights in the walls). Avoid area rugs or power cords from appliances in the middle of the walkways. Use a walker or a cane if necessary and consider physical therapy for balance exercise. Get your eyesight checked regularly.  FINANCIAL OVERSIGHT: Supervision, especially oversight when making financial decisions or transactions is also recommended.  HOME SAFETY: Consider the safety of the kitchen when operating appliances like stoves, microwave oven, and blender. Consider having supervision and share cooking responsibilities until no longer able to participate in those. Accidents with firearms and other hazards in the house should be identified and addressed as well.   ABILITY TO BE LEFT ALONE: If patient is unable to contact 911 operator, consider using LifeLine, or when the need is there, arrange for someone to stay with patients. Smoking is a fire hazard, consider supervision or cessation. Risk of wandering should be assessed by caregiver and if detected at any point, supervision and safe proof recommendations should be instituted.  MEDICATION SUPERVISION:  Inability to self-administer medication needs to be constantly addressed. Implement a mechanism to ensure safe administration of the medications.      Mediterranean Diet A Mediterranean diet refers to food and lifestyle choices that are based on the traditions of countries located on the [employer]. This way of eating has been shown to help prevent certain conditions and improve outcomes for people who have chronic diseases, like kidney disease and heart disease. What are tips for following this plan? Lifestyle  Cook and eat meals together with your family, when possible. Drink enough fluid to keep your urine clear or pale yellow. Be physically active every day. This includes: Aerobic exercise like running or swimming. Leisure activities like gardening, walking, or housework. Get 7-8 hours of sleep each night. If recommended by your health care provider, drink red wine in moderation. This means 1 glass a day for nonpregnant women and 2 glasses a day for men. A glass of wine equals 5 oz (150 mL). Reading food labels  Check the serving size of packaged foods. For foods such as rice and pasta, the serving size refers to the amount of cooked product, not dry. Check the total fat in packaged foods. Avoid foods that have saturated fat or trans fats. Check the ingredients list for added sugars, such as corn syrup. Shopping  At the grocery store, buy most of your food from the areas near the walls of the store. This includes: Fresh fruits  and vegetables (produce). Grains, beans, nuts, and seeds. Some of these may be available in unpackaged forms or large amounts (in bulk). Fresh seafood. Poultry and eggs. Low-fat dairy products. Buy whole ingredients instead of prepackaged foods. Buy fresh fruits and vegetables in-season from local farmers markets. Buy frozen fruits and vegetables in resealable bags. If you do not have access to quality fresh seafood, buy precooked frozen shrimp or  canned fish, such as tuna, salmon, or sardines. Buy small amounts of raw or cooked vegetables, salads, or olives from the deli or salad bar at your store. Stock your pantry so you always have certain foods on hand, such as olive oil, canned tuna, canned tomatoes, rice, pasta, and beans. Cooking  Cook foods with extra-virgin olive oil instead of using butter or other vegetable oils. Have meat as a side dish, and have vegetables or grains as your main dish. This means having meat in small portions or adding small amounts of meat to foods like pasta or stew. Use beans or vegetables instead of meat in common dishes like chili or lasagna. Experiment with different cooking methods. Try roasting or broiling vegetables instead of steaming or sauteing them. Add frozen vegetables to soups, stews, pasta, or rice. Add nuts or seeds for added healthy fat at each meal. You can add these to yogurt, salads, or vegetable dishes. Marinate fish or vegetables using olive oil, lemon juice, garlic, and fresh herbs. Meal planning  Plan to eat 1 vegetarian meal one day each week. Try to work up to 2 vegetarian meals, if possible. Eat seafood 2 or more times a week. Have healthy snacks readily available, such as: Vegetable sticks with hummus. Greek yogurt. Fruit and nut trail mix. Eat balanced meals throughout the week. This includes: Fruit: 2-3 servings a day Vegetables: 4-5 servings a day Low-fat dairy: 2 servings a day Fish, poultry, or lean meat: 1 serving a day Beans and legumes: 2 or more servings a week Nuts and seeds: 1-2 servings a day Whole grains: 6-8 servings a day Extra-virgin olive oil: 3-4 servings a day Limit red meat and sweets to only a few servings a month What are my food choices? Mediterranean diet Recommended Grains: Whole-grain pasta. Brown rice. Bulgar wheat. Polenta. Couscous. Whole-wheat bread. Orpah Cobb. Vegetables: Artichokes. Beets. Broccoli. Cabbage. Carrots. Eggplant.  Green beans. Chard. Kale. Spinach. Onions. Leeks. Peas. Squash. Tomatoes. Peppers. Radishes. Fruits: Apples. Apricots. Avocado. Berries. Bananas. Cherries. Dates. Figs. Grapes. Lemons. Melon. Oranges. Peaches. Plums. Pomegranate. Meats and other protein foods: Beans. Almonds. Sunflower seeds. Pine nuts. Peanuts. Cod. Salmon. Scallops. Shrimp. Tuna. Tilapia. Clams. Oysters. Eggs. Dairy: Low-fat milk. Cheese. Greek yogurt. Beverages: Water. Red wine. Herbal tea. Fats and oils: Extra virgin olive oil. Avocado oil. Grape seed oil. Sweets and desserts: [place] yogurt with honey. Baked apples. Poached pears. Trail mix. Seasoning and other foods: Basil. Cilantro. Coriander. Cumin. Mint. Parsley. Sage. Rosemary. Tarragon. Garlic. Oregano. Thyme. Pepper. Balsalmic vinegar. Tahini. Hummus. Tomato sauce. Olives. Mushrooms. Limit these Grains: Prepackaged pasta or rice dishes. Prepackaged cereal with added sugar. Vegetables: Deep fried potatoes (french fries). Fruits: Fruit canned in syrup. Meats and other protein foods: Beef. Pork. Lamb. Poultry with skin. Hot dogs. Tomasa Blase. Dairy: Ice cream. Sour cream. Whole milk. Beverages: Juice. Sugar-sweetened soft drinks. Beer. Liquor and spirits. Fats and oils: Butter. Canola oil. Vegetable oil. Beef fat (tallow). Lard. Sweets and desserts: Cookies. Cakes. Pies. Candy. Seasoning and other foods: Mayonnaise. Premade sauces and marinades. The items listed may not be a complete list. Talk  with your dietitian about what dietary choices are right for you. Summary The Mediterranean diet includes both food and lifestyle choices. Eat a variety of fresh fruits and vegetables, beans, nuts, seeds, and whole grains. Limit the amount of red meat and sweets that you eat. Talk with your health care provider about whether it is safe for you to drink red wine in moderation. This means 1 glass a day for nonpregnant women and 2 glasses a day for men. A glass of wine equals 5 oz (150  mL). This information is not intended to replace advice given to you by your health care provider. Make sure you discuss any questions you have with your health care provider. Document Released: 10/30/2015 Document Revised: 12/02/2015 Document Reviewed: 10/30/2015 Elsevier Interactive Patient Education  2017 [employer].

## 2023-06-30 NOTE — Progress Notes (Signed)
 Assessment/Plan:    Advanced mild cognitive impairment, concern for Alzheimer's disease  [name] is a very pleasant 78 y.o. RH female with a history of hypertension, hyperlipidemia, anxiety, depression in remission, diverticulitis and a diagnosis of advanced mild cognitive impairment per neuropsych evaluation 2025 seen today in follow up for memory loss. Patient is currently on memantine 5 mg twice daily.  Discussed increasing the dose of the medication for better coverage, patient both agree.  He is able to participate in her ADLs without difficulty.  She does not drive due to increased agitation doing so.  Mood is stable.  Follow up in 6 months. Repeat neuropsych evaluation in 1 year Increase memantine to 10 mg twice daily, side effects discussed  Recommend good control of her cardiovascular risk factors Continue to control mood as per PCP Continue B12 supplements Recommend checking hearing to improve comprehension. Recommend using hearing aids Recommend increasing level of physical activity    Subjective:    This patient is accompanied in the office by her husband who supplements the history.  Previous records as well as any outside records available were reviewed prior to todays visit. Patient was last seen on 11/12/2022 with [facility] 18/30    Any changes in memory since last visit? " About the same".  She reports more issues with short-term memory, long-term memory may be affected as well to a certain extent.  Likes to read.  repeats oneself?  Endorsed Disoriented when walking into a room?  Patient denies    Leaving objects?  May misplace things but not in unusual places   Wandering behavior?  denies   Any personality changes since last visit?  denies   Any worsening depression?:  Denies.   Hallucinations or paranoia?  Denies. Seizures? denies    Any sleep changes? Sleeps well "too much" to which she responds "I am not a morning person".  Denies vivid dreams, REM  behavior or sleepwalking   Sleep apnea?  She has done a recent study, results pending. Any hygiene concerns? Denies.  Independent of bathing and dressing?  Endorsed  Does the patient needs help with medications?  Husband is in charge   Who is in charge of the finances? Patient is in charge     Any changes in appetite?  denies     Patient have trouble swallowing? Denies.   Does the patient cook? No. Any headaches?   denies   Chronic back pain  denies   Ambulates with difficulty? She has not been doing balance therapy, does not enjoy it, but understands that it may be necessary so she is to resume.  She leans on her husband to walk, because she does not like to use a cane.  She denies any falls or head injuries Unilateral weakness, numbness or tingling? Denies.  No new signs or symptoms of a stroke Any tremors?  Denies   Any anosmia?  Denies   Any incontinence of urine?  Endorsed, wears pads Any bowel dysfunction?  She has a history of diverticulitis, intermittent diarrhea.    Patient lives with her husband Does the patient drive? No longer drives       Initial visit 11/12/2022 How long did patient have memory difficulties? For the last year.  Sometimes she forgets the times of her appointments or names  repeats oneself?  Endorsed Disoriented when walking into a room?  Patient denies   Leaving objects in unusual places? denies   Wandering behavior?  denies   Any  personality changes?  Patient denies   Any history of depression?:  Patient denies any recent depression, it is on remission  Hallucinations or paranoia?  Patient denies   Seizures?   Patient denies    Any sleep changes?   Sleeps well " most of the time" Occasional vivid dreams, denies REM behavior or sleepwalking   Sleep apnea?  Patient denies   Any hygiene concerns?  Patient denies   Independent of bathing and dressing?  Endorsed  Does the patient needs help with medications? Husband is in charge   Who is in charge of the  finances? We have separate accounts but husband is in charge     Any changes in appetite?  Denies      Patient have trouble swallowing? Denies    Does the patient cook?  Yes "sometimes she forgets ingredients, which frustrates her"-husband says.   Any kitchen accidents such as leaving the stove on? denies   Any headaches?   Denies    Chronic pain ?  arthritis     Ambulates with difficulty?  Started balance therapy and likes like it   Recent falls or head injuries? Denies    Vision changes? denies   Unilateral weakness, numbness or tingling? Denies    Any tremors?   Denies    Any anosmia?  Denies    Any incontinence of urine?  Endorsed. Any bowel dysfunction? Yes "it goes back and forth, mostly diarrhea". She has a history of IBS  Patient lives with her husband History of heavy alcohol intake? Denies.   History of heavy tobacco use? Denies.   Family history of dementia? "I don't really know".   Does patient drive?  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    Neuropsych evaluation March 2025 Dr. Robbie Lis Briefly, results indicated cognitive deficits in multiple domains, with the most prominent impairments being in memory and visuospatial skills. While overall functioning remains largely preserved, there are concerns about potential decline. Considering her cognitive and functional status, findings are at the threshold between a diagnosis of advanced mild cognitive impairment and mild dementia. 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 The overall clinical picture, including the insidious onset of symptoms, difficulties with orientation, amnestic memory profile, and visuospatial impairments, raises concern for a underlying Alzheimer's disease process. Other possible etiological contributors include hearing loss,  anxiety, chronic cerebrovascular disease noted on MRI, and the possibility of untreated sleep apnea.   MRI of the brain 11/28/2022, personally reviewed without acute findings, remarkable for chronic small vessel ischemic changes, mild in the cerebral white matter and moderate in the pons, mild generalized cerebral atrophy  PREVIOUS MEDICATIONS:    CURRENT MEDICATIONS:  Outpatient Encounter Medications as of 06/30/2023  Medication Sig   acetaminophen (TYLENOL) 500 MG tablet Take 1,000 mg by mouth every 6 (six) hours as needed (for pain.).    albuterol (PROVENTIL HFA;VENTOLIN HFA) 108 (90 Base) MCG/ACT inhaler Inhale 2 puffs into the lungs every 6 (six) hours as needed for shortness of breath or wheezing.   ALPRAZolam (XANAX) 0.25 MG tablet Take 0.25 mg by mouth daily as needed for anxiety.   BIOTIN PO Take 1 tablet by mouth daily.   cetirizine (ZYRTEC) 10 MG chewable tablet Chew 10 mg by mouth daily.   Cholecalciferol (DIALYVITE VITAMIN D 5000) 125 MCG (5000 UT) capsule  Take 5,000 Units by mouth 2 (two) times daily.   cyclobenzaprine (FLEXERIL) 10 MG tablet Take 10 mg by mouth 3 (three) times daily as needed for muscle spasms.   diltiazem (CARDIZEM CD) 240 MG 24 hr capsule TAKE 1 CAPSULE BY MOUTH EVERY DAY (Patient taking differently: Take 240 mg by mouth daily.)   DULoxetine (CYMBALTA) 30 MG capsule 1 capsule Orally Once a day at supper X 7 days for 7 days   famotidine (PEPCID) 20 MG tablet Take 20 mg by mouth 2 (two) times daily.   loratadine (CLARITIN) 10 MG tablet Take 10 mg by mouth daily.   losartan (COZAAR) 50 MG tablet Take 50 mg by mouth daily.   Melatonin 10 MG TABS Take 10 mg by mouth at bedtime.   memantine (NAMENDA) 10 MG tablet Take 1 tablet (10 mg total) by mouth 2 (two) times daily.   Multiple Vitamin (MULTIVITAMIN WITH MINERALS) TABS tablet Take 1 tablet by mouth daily. Centrum   ondansetron (ZOFRAN-ODT) 8 MG disintegrating tablet DISSOLVE 1 TABLET IN MOUTH EVERY 8 HRS AS NEEDED  NAUSEA   pantoprazole (PROTONIX) 40 MG tablet TAKE 1 TABLET (40 MG TOTAL) BY MOUTH TWICE A DAY BEFORE MEALS   simvastatin (ZOCOR) 20 MG tablet Take 1 tablet (20 mg total) by mouth daily.   vitamin B-12 (CYANOCOBALAMIN) 1000 MCG tablet Take 1,000 mcg by mouth daily.   No facility-administered encounter medications on file as of 06/30/2023.        No data to display            11/12/2022    1:00 PM  Montreal Cognitive Assessment   Visuospatial/ Executive (0/5) 1  Naming (0/3) 3  Attention: Read list of digits (0/2) 2  Attention: Read list of letters (0/1) 1  Attention: Serial 7 subtraction starting at 100 (0/3) 1  Language: Repeat phrase (0/2) 2  Language : Fluency (0/1) 1  Abstraction (0/2) 0  Delayed Recall (0/5) 0  Orientation (0/6) 6  Total 17  Adjusted Score (based on education) 18    Objective:     PHYSICAL EXAMINATION:    VITALS:   Vitals:   06/30/23 1250  BP: 134/76  Pulse: 93  Resp: 20  SpO2: 97%  Weight: 222 lb (100.7 kg)  Height: 5\' 2"  (1.575 m)    GEN:  The patient appears stated age and is in NAD. HEENT:  Normocephalic, atraumatic.   Neurological examination:  General: NAD, well-groomed, appears stated age. Orientation: The patient is alert. Oriented to person, place and not to date Cranial nerves: There is good facial symmetry.The speech is fluent and clear. No aphasia or dysarthria. Fund of knowledge is appropriate. Recent and remote memory are impaired. Attention and concentration are reduced.  Able to name objects and repeat phrases.  Hearing is intact to conversational tone. Sensation: Sensation is intact to light touch throughout Motor: Strength is at least antigravity x4. DTR's 2/4 in UE/LE     Movement examination: Tone: There is normal tone in the UE/LE Abnormal movements:  no tremor.  No myoclonus.  No asterixis.   Coordination:  There is no decremation with RAM's. Normal finger to nose  Gait and Station: The patient has no  difficulty arising out of a deep-seated chair without the use of the hands. The patient's stride length is good.  Gait is cautious and narrow.    Thank you for allowing [place] the opportunity to participate in the care of this nice patient. Please do not hesitate  to contact us for any questions or concerns.   Total time spent on today's visit was 32 minutes dedicated to this patient today, preparing to see patient, examining the patient, ordering tests and/or medications and counseling the patient, documenting clinical information in the EHR or other health record, independently interpreting results and communicating results to the patient/family, discussing treatment and goals, answering patient's questions and coordinating care.  Cc:  Cleatis Polka., MD  Marlowe Kays 06/30/2023 1:32 PM

## 2023-07-03 ENCOUNTER — Encounter: Payer: Self-pay | Admitting: Cardiovascular Disease

## 2023-07-05 DIAGNOSIS — D500 Iron deficiency anemia secondary to blood loss (chronic): Secondary | ICD-10-CM | POA: Diagnosis not present

## 2023-07-05 DIAGNOSIS — D649 Anemia, unspecified: Secondary | ICD-10-CM | POA: Diagnosis not present

## 2023-07-05 DIAGNOSIS — K219 Gastro-esophageal reflux disease without esophagitis: Secondary | ICD-10-CM | POA: Diagnosis not present

## 2023-07-05 DIAGNOSIS — E785 Hyperlipidemia, unspecified: Secondary | ICD-10-CM | POA: Diagnosis not present

## 2023-07-05 DIAGNOSIS — R7301 Impaired fasting glucose: Secondary | ICD-10-CM | POA: Diagnosis not present

## 2023-07-05 DIAGNOSIS — I480 Paroxysmal atrial fibrillation: Secondary | ICD-10-CM | POA: Diagnosis not present

## 2023-07-05 DIAGNOSIS — I10 Essential (primary) hypertension: Secondary | ICD-10-CM | POA: Diagnosis not present

## 2023-07-05 DIAGNOSIS — E559 Vitamin D deficiency, unspecified: Secondary | ICD-10-CM | POA: Diagnosis not present

## 2023-07-12 DIAGNOSIS — Z8719 Personal history of other diseases of the digestive system: Secondary | ICD-10-CM | POA: Diagnosis not present

## 2023-07-12 DIAGNOSIS — J45909 Unspecified asthma, uncomplicated: Secondary | ICD-10-CM | POA: Diagnosis not present

## 2023-07-12 DIAGNOSIS — Z9884 Bariatric surgery status: Secondary | ICD-10-CM | POA: Diagnosis not present

## 2023-07-12 DIAGNOSIS — Z860101 Personal history of adenomatous and serrated colon polyps: Secondary | ICD-10-CM | POA: Diagnosis not present

## 2023-07-12 DIAGNOSIS — I700 Atherosclerosis of aorta: Secondary | ICD-10-CM | POA: Diagnosis not present

## 2023-07-12 DIAGNOSIS — G4700 Insomnia, unspecified: Secondary | ICD-10-CM | POA: Diagnosis not present

## 2023-07-12 DIAGNOSIS — I10 Essential (primary) hypertension: Secondary | ICD-10-CM | POA: Diagnosis not present

## 2023-07-12 DIAGNOSIS — F418 Other specified anxiety disorders: Secondary | ICD-10-CM | POA: Diagnosis not present

## 2023-07-12 DIAGNOSIS — R7301 Impaired fasting glucose: Secondary | ICD-10-CM | POA: Diagnosis not present

## 2023-07-12 DIAGNOSIS — Z1339 Encounter for screening examination for other mental health and behavioral disorders: Secondary | ICD-10-CM | POA: Diagnosis not present

## 2023-07-12 DIAGNOSIS — Z1331 Encounter for screening for depression: Secondary | ICD-10-CM | POA: Diagnosis not present

## 2023-07-12 DIAGNOSIS — I480 Paroxysmal atrial fibrillation: Secondary | ICD-10-CM | POA: Diagnosis not present

## 2023-07-12 DIAGNOSIS — E785 Hyperlipidemia, unspecified: Secondary | ICD-10-CM | POA: Diagnosis not present

## 2023-07-12 DIAGNOSIS — G3184 Mild cognitive impairment, so stated: Secondary | ICD-10-CM | POA: Diagnosis not present

## 2023-07-12 DIAGNOSIS — R82998 Other abnormal findings in urine: Secondary | ICD-10-CM | POA: Diagnosis not present

## 2023-07-12 DIAGNOSIS — K582 Mixed irritable bowel syndrome: Secondary | ICD-10-CM | POA: Diagnosis not present

## 2023-07-12 DIAGNOSIS — Z0000 Encounter for general adult medical examination without abnormal findings: Secondary | ICD-10-CM | POA: Diagnosis not present

## 2023-07-12 DIAGNOSIS — K219 Gastro-esophageal reflux disease without esophagitis: Secondary | ICD-10-CM | POA: Diagnosis not present

## 2023-07-27 ENCOUNTER — Other Ambulatory Visit: Payer: Self-pay | Admitting: Physician Assistant

## 2023-08-01 ENCOUNTER — Ambulatory Visit: Payer: Medicare Other

## 2023-08-01 DIAGNOSIS — I480 Paroxysmal atrial fibrillation: Secondary | ICD-10-CM | POA: Diagnosis not present

## 2023-08-02 LAB — CUP PACEART REMOTE DEVICE CHECK
Date Time Interrogation Session: 20250511233259
Implantable Pulse Generator Implant Date: 20211103

## 2023-08-04 ENCOUNTER — Ambulatory Visit (INDEPENDENT_AMBULATORY_CARE_PROVIDER_SITE_OTHER): Admitting: Otolaryngology

## 2023-08-04 ENCOUNTER — Encounter (INDEPENDENT_AMBULATORY_CARE_PROVIDER_SITE_OTHER): Payer: Self-pay | Admitting: Otolaryngology

## 2023-08-04 VITALS — BP 127/76 | HR 100 | Ht 62.0 in | Wt 220.0 lb

## 2023-08-04 DIAGNOSIS — R0981 Nasal congestion: Secondary | ICD-10-CM

## 2023-08-04 DIAGNOSIS — J343 Hypertrophy of nasal turbinates: Secondary | ICD-10-CM

## 2023-08-04 DIAGNOSIS — H9313 Tinnitus, bilateral: Secondary | ICD-10-CM

## 2023-08-04 DIAGNOSIS — R42 Dizziness and giddiness: Secondary | ICD-10-CM

## 2023-08-04 DIAGNOSIS — J310 Chronic rhinitis: Secondary | ICD-10-CM

## 2023-08-04 DIAGNOSIS — H903 Sensorineural hearing loss, bilateral: Secondary | ICD-10-CM | POA: Diagnosis not present

## 2023-08-04 MED ORDER — PREDNISONE 10 MG (21) PO TBPK: ORAL_TABLET | ORAL | 0 refills | Status: DC

## 2023-08-05 ENCOUNTER — Ambulatory Visit: Payer: Self-pay | Admitting: Cardiovascular Disease

## 2023-08-06 DIAGNOSIS — R42 Dizziness and giddiness: Secondary | ICD-10-CM | POA: Insufficient documentation

## 2023-08-06 DIAGNOSIS — H9313 Tinnitus, bilateral: Secondary | ICD-10-CM | POA: Insufficient documentation

## 2023-08-06 DIAGNOSIS — J310 Chronic rhinitis: Secondary | ICD-10-CM | POA: Insufficient documentation

## 2023-08-06 DIAGNOSIS — H903 Sensorineural hearing loss, bilateral: Secondary | ICD-10-CM | POA: Insufficient documentation

## 2023-08-06 NOTE — Progress Notes (Signed)
 Patient ID: [name], female   DOB: 1945-09-05, 78 y.o.   MRN: [id number]  Follow up: Hearing loss, tinnitus, dizziness  HPI: The patient is a 78 year old female who returns today for follow-up of her bilateral hearing loss, tinnitus, and dizziness.  Her previous vestibular neurodiagnostic testing showed both peripheral and central vestibular dysfunction.  Her caloric test showed unilateral right ear weakness.  She was treated with vestibular rehabilitation and physical therapy.  The patient returns today complaining of persistent dizziness.  In addition, she also complains of nasal congestion, increasing tinnitus, and decreased hearing over the past month.  She was recently treated with 1 course of oral antibiotic without improvement in her symptoms.  She is currently on Flonase  daily.  She recently saw her neurologist, and was diagnosed with cognitive memory loss.  Her MRI scan was negative.  Currently she denies any otalgia, otorrhea, or spinning vertigo.  Exam: General: Communicates without difficulty, well nourished, no acute distress. Head: Normocephalic, no evidence injury, no tenderness, facial buttresses intact without stepoff. Face/sinus: No tenderness to palpation and percussion. Facial movement is normal and symmetric. Eyes: PERRL, EOMI. No scleral icterus, conjunctivae clear. Neuro: CN II exam reveals vision grossly intact.  No nystagmus at any point of gaze. Ears: Auricles well formed without lesions.  Ear canals are intact without mass or lesion.  No erythema or edema is appreciated.  The TMs are intact without fluid. Nose: External evaluation reveals normal support and skin without lesions.  Dorsum is intact.  Anterior rhinoscopy reveals congested mucosa over anterior aspect of inferior turbinates and intact septum.  No purulence noted. Oral:  Oral cavity and oropharynx are intact, symmetric, without erythema or edema.  Mucosa is moist without lesions. Neck: Full range of motion  without pain.  There is no significant lymphadenopathy.  No masses palpable.  Thyroid  bed within normal limits to palpation.  Parotid glands and submandibular glands equal bilaterally without mass.  Trachea is midline. Neuro:  CN 2-12 grossly intact. Gait wide-based. Vestibular: No nystagmus at any point of gaze. Dix Hallpike negative. The cerebellar examination is unremarkable. Vestibular: There is no nystagmus with pneumatic pressure on either tympanic membrane or Valsalva.   Assessment: 1.  Recurrent dizziness, likely exacerbation of her peripheral and central vestibular dysfunction.  The patient also has multiple risk factors, including history of bilateral knee, hip, and ankle replacement surgeries.  She was recently diagnosed with cognitive memory loss by her neurologist.  Her Dix-Hallpike maneuver is negative.  2.  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 3.  Chronic rhinitis with nasal mucosal congestion and bilateral inferior turbinate hypertrophy.  Plan: 1.  The physical exam findings are reviewed with the patient.  2.  Continue with her balance exercises at home. 3.  Prednisone  Dosepak for 6 days. 4.  Flonase  nasal spray 2 sprays each nostril daily. 5.  The pathophysiology of dizziness and vestibular dysfunction are extensively discussed.  Questions were invited and answered. 6.  The strategies to cope with tinnitus are also discussed. 7.  Continue the use of her hearing aids. 8.  The patient will return for reevaluation in 3 weeks.  We will repeat her hearing test at that time.

## 2023-08-17 NOTE — Progress Notes (Signed)
 [facility] Summary Report / Loop Recorder

## 2023-08-17 NOTE — Addendum Note (Signed)
 Addended by: Edra Govern D on: 08/17/2023 10:59 AM   Modules accepted: Orders

## 2023-08-29 ENCOUNTER — Ambulatory Visit (INDEPENDENT_AMBULATORY_CARE_PROVIDER_SITE_OTHER): Admitting: Otolaryngology

## 2023-08-29 ENCOUNTER — Ambulatory Visit (INDEPENDENT_AMBULATORY_CARE_PROVIDER_SITE_OTHER): Admitting: Audiology

## 2023-08-29 ENCOUNTER — Encounter (INDEPENDENT_AMBULATORY_CARE_PROVIDER_SITE_OTHER): Payer: Self-pay | Admitting: Otolaryngology

## 2023-08-29 VITALS — BP 129/80 | HR 94 | Ht 62.5 in | Wt 215.0 lb

## 2023-08-29 DIAGNOSIS — R0981 Nasal congestion: Secondary | ICD-10-CM | POA: Diagnosis not present

## 2023-08-29 DIAGNOSIS — H903 Sensorineural hearing loss, bilateral: Secondary | ICD-10-CM | POA: Diagnosis not present

## 2023-08-29 DIAGNOSIS — J343 Hypertrophy of nasal turbinates: Secondary | ICD-10-CM | POA: Diagnosis not present

## 2023-08-29 DIAGNOSIS — R42 Dizziness and giddiness: Secondary | ICD-10-CM | POA: Diagnosis not present

## 2023-08-29 DIAGNOSIS — H9313 Tinnitus, bilateral: Secondary | ICD-10-CM | POA: Diagnosis not present

## 2023-08-29 DIAGNOSIS — J310 Chronic rhinitis: Secondary | ICD-10-CM

## 2023-08-29 NOTE — Progress Notes (Signed)
  [address] [place], [place] [address] [phone]  Audiological Evaluation    Name: [name]     DOB:   1945-12-06      MRN:   [id number]                                                                                     Service Date: 08/29/2023     Accompanied by: husband in the booth   Patient comes today after Dr. Darlin Ehrlich, ENT sent a referral for a hearing evaluation due to concerns with hearing loss.   Symptoms Yes Details  Hearing loss  [x]  Reports to struggle hearing, previous audiograms have shown hearing loss  Tinnitus  [x]  Reports music in her ears  Ear pain/ infections/pressure  []    Balance problems  []    Noise exposure history  []    Previous ear surgeries  []    Family history of hearing loss  []    Amplification  [x]  Has a set of aids [facility], does not wear them at home.  Other  [x]  Reports recently was diagnosed with cognitive memory loss.     Otoscopy: Right ear: Clear external ear canal and notable landmarks visualized on the tympanic membrane. Left ear:  Clear external ear canal and notable landmarks visualized on the tympanic membrane.  Tympanometry: Right ear: Type A- Normal external ear canal volume with normal middle ear pressure and tympanic membrane compliance. Left ear: Type A- Normal external ear canal volume with normal middle ear pressure and tympanic membrane compliance.  Pure tone Audiometry:  Normal to profound sensorineural hearing loss from 125 Hz - 8000 Hz, bilaterally. It is slightly worse in the right ear (previous test also showed this).    Speech Audiometry: Right ear- Speech Reception Threshold (SRT) was obtained at 45 dBHL. Left ear-Speech Reception Threshold (SRT) was obtained at 40 dBHL.   Word Recognition Score Tested using NU-6 (recorded) Right ear: 50% was obtained at a presentation level of 85 dBHL with contralateral masking which is deemed as  poor. Left ear: 72% was obtained at a  presentation level of 85 dBHL with contralateral masking which is deemed as  fair.   The hearing test results were completed under inserts and results are deemed to be of good reliability. Test technique:  conventional    Impression: No significant decline when compared to her previous audiogram completed at [facility] in 2024.  Recommendations: Follow up with ENT as scheduled for today. Return for a hearing evaluation if concerns with hearing changes arise or per MD recommendation. Recommend full time hearing aid use and schedule hearing aid check if she does not perceive ( or family ) too much improvement from using the aids to see if the equipment for LSM is connected and working.     Phyllip Claw MARIE LEROUX-MARTINEZ, AUD

## 2023-08-31 DIAGNOSIS — J343 Hypertrophy of nasal turbinates: Secondary | ICD-10-CM | POA: Insufficient documentation

## 2023-08-31 NOTE — Addendum Note (Signed)
 Addended byReynold Caves on: 08/31/2023 07:03 PM   Modules accepted: Orders

## 2023-08-31 NOTE — Progress Notes (Signed)
 Patient ID: [name], female   DOB: [birth year], 78 y.o.   MRN: [id number]  Follow-up: Dizziness, bilateral hearing loss, bilateral tinnitus, chronic nasal congestion  HPI: The patient is a 78 year old female who returns today for her follow-up evaluation.  The patient was previously seen for her chronic nasal congestion, recurrent dizziness, bilateral hearing loss, and bilateral tinnitus.  She was previously noted to have peripheral and central vestibular dysfunction.  She was treated with vestibular rehabilitation/physical therapy.  Her chronic rhinitis was treated with Flonase  and prednisone .  The patient returns today reporting improvement in her nasal congestion.  However, she still has minimal smell or taste sensation.  She denies any change in her hearing.  She is using bilateral hearing aids.  She is still having bilateral tinnitus.  She also has frequent dizziness.  He describes the dizziness as an off-balance and lightheaded sensation.  Exam: General: Communicates without difficulty, well nourished, no acute distress. Head: Normocephalic, no evidence injury, no tenderness, facial buttresses intact without stepoff. Face/sinus: No tenderness to palpation and percussion. Facial movement is normal and symmetric. Eyes: PERRL, EOMI. No scleral icterus, conjunctivae clear. Neuro: CN II exam reveals vision grossly intact.  No nystagmus at any point of gaze. Ears: Auricles well formed without lesions.  Ear canals are intact without mass or lesion.  No erythema or edema is appreciated.  The TMs are intact without fluid. Nose: External evaluation reveals normal support and skin without lesions.  Dorsum is intact.  Anterior rhinoscopy reveals congested mucosa over anterior aspect of inferior turbinates and intact septum.  No purulence noted. Oral:  Oral cavity and oropharynx are intact, symmetric, without erythema or edema.  Mucosa is moist without lesions. Neck: Full range of motion without pain.  There  is no significant lymphadenopathy.  No masses palpable.  Thyroid  bed within normal limits to palpation.  Parotid glands and submandibular glands equal bilaterally without mass.  Trachea is midline. Neuro:  CN 2-12 grossly intact. Gait wide-based. Vestibular: No nystagmus at any point of gaze. Dix Hallpike negative. The cerebellar examination is unremarkable. Vestibular: There is no nystagmus with pneumatic pressure on either tympanic membrane or Valsalva.    Her hearing test shows stable bilateral high-frequency sensorineural hearing loss.  Assessment: 1.  Recurrent dizziness, a result of her peripheral and central vestibular dysfunction.  The patient also has multiple risk factors, including history of bilateral knee, hip, and ankle replacement surgeries.  She was also diagnosed with cognitive memory loss by her neurologist.  2.  Stable bilateral high-frequency sensorineural hearing loss and tinnitus. 3.  Chronic rhinitis with nasal mucosal congestion and bilateral inferior turbinate hypertrophy.  The severity of her nasal congestion has decreased.  Plan: 1.  The physical exam findings and hearing test results are reviewed with the patient. 2.  Continue the use of her hearing aids. 3.  Continue with her balance exercises at home. 4.  The strategies to cope with tinnitus are reviewed. 5.  Continue with Flonase  nasal spray and Valsalva exercise. 6.  The pathophysiology of dizziness and bilateral vestibular dysfunction are again discussed.  The benefit of vestibular exercises are explained. 7.  The patient is encouraged to call with any questions or concerns.

## 2023-09-01 ENCOUNTER — Ambulatory Visit (INDEPENDENT_AMBULATORY_CARE_PROVIDER_SITE_OTHER)

## 2023-09-01 DIAGNOSIS — I480 Paroxysmal atrial fibrillation: Secondary | ICD-10-CM | POA: Diagnosis not present

## 2023-09-01 LAB — CUP PACEART REMOTE DEVICE CHECK
Date Time Interrogation Session: 20250611230333
Implantable Pulse Generator Implant Date: 20211103

## 2023-09-03 ENCOUNTER — Ambulatory Visit: Payer: Self-pay | Admitting: Cardiovascular Disease

## 2023-09-05 ENCOUNTER — Encounter

## 2023-09-13 ENCOUNTER — Ambulatory Visit: Attending: Cardiovascular Disease | Admitting: Cardiovascular Disease

## 2023-09-13 VITALS — BP 110/70 | HR 102 | Ht 62.0 in | Wt 220.0 lb

## 2023-09-13 DIAGNOSIS — I480 Paroxysmal atrial fibrillation: Secondary | ICD-10-CM | POA: Insufficient documentation

## 2023-09-13 NOTE — Progress Notes (Signed)
  Electrophysiology Office Note:    Date:  09/13/2023   ID:  LILIAS LORENSEN, DOB 10-Oct-1945, MRN [id number]  PCP:  Loreli Elsie JONETTA Mickey., MD   [facility] Providers Cardiologist:  Dorn Lesches, MD Electrophysiologist:  Eulas FORBES Furbish, MD     Referring MD: Loreli Elsie JONETTA Mickey., MD   History of Present Illness:    [name] is a 78 y.o. female with a medical history significant for atrial fibrillation, obesity status post gastric sleeve referred for EP follow up.      Discussed the use of AI scribe software for clinical note transcription with the patient, who gave verbal consent to proceed.  History of Present Illness She had a Medtronic Link 2 loop recorder implanted in November 2021 for atrial fibrillation surveillance. Over the past year or two, the device has not shown any clinically significant events or changes.  There have been a few brief episodes of atrial fibrillation, but duration not significant to indicate oral anticoagulation.  She experienced a gastrointestinal bleed in the past, which led to the discontinuation of her anticoagulation therapy. The most significant GI bleed occurred in November of the previous year, requiring hospitalization and a blood transfusion. Due to this history, she is not currently on blood thinners, which raises her concern about the risk of stroke.  She does not experience symptoms when she has atrial fibrillation and was unaware of the condition until it was detected by the loop recorder. Her sister had a massive stroke, which heightens her awareness and concern about stroke risk.           Today, she reports that she feels well and has no complaints  EKGs/Labs/Other Studies Reviewed Today:       EKG:         Physical Exam:    VS:  BP 110/70 (BP Location: Right Arm, Patient Position: Sitting, Cuff Size: Large)   Pulse (!) 102   Ht 5' 2 (1.575 m)   Wt 220 lb (99.8 kg)   SpO2 96%   BMI 40.24 kg/m      Wt Readings from Last 3 Encounters:  09/13/23 220 lb (99.8 kg)  08/29/23 215 lb (97.5 kg)  08/04/23 220 lb (99.8 kg)     GEN: Well nourished, well developed in no acute distress CARDIAC: RRR, no murmurs, rubs, gallops RESPIRATORY:  Normal work of breathing MUSCULOSKELETAL: no edema    ASSESSMENT & PLAN:     History of paroxysmal atrial fibrillation No A-fib events detected on loop recorder in the past year of clinically significant duration She is not on anticoagulation due to history of recurrent GI bleeds Will continue to monitor with loop recorder If she has increase in A-fib burden, may need to refer for watchman placement  Obesity Status post gastric banding procedure   This is my first time meeting Ms. Green.  I reviewed previous loop recorder reports over the past 4 years, notes from Jenna Arthur and Dr. Kelsie.  Signed, Eulas FORBES Furbish, MD  09/13/2023 2:24 PM    [facility]

## 2023-09-13 NOTE — Patient Instructions (Signed)

## 2023-09-20 NOTE — Addendum Note (Signed)
 Addended by: TAWNI DRILLING D on: 09/20/2023 11:08 AM   Modules accepted: Orders

## 2023-09-20 NOTE — Progress Notes (Signed)
 [facility] Summary Report / Loop Recorder

## 2023-09-29 DIAGNOSIS — H5000 Unspecified esotropia: Secondary | ICD-10-CM | POA: Diagnosis not present

## 2023-09-29 DIAGNOSIS — H532 Diplopia: Secondary | ICD-10-CM | POA: Diagnosis not present

## 2023-10-03 ENCOUNTER — Ambulatory Visit (INDEPENDENT_AMBULATORY_CARE_PROVIDER_SITE_OTHER)

## 2023-10-03 DIAGNOSIS — I480 Paroxysmal atrial fibrillation: Secondary | ICD-10-CM

## 2023-10-04 ENCOUNTER — Other Ambulatory Visit: Payer: Self-pay | Admitting: Gastroenterology

## 2023-10-04 ENCOUNTER — Ambulatory Visit: Payer: Self-pay

## 2023-10-04 ENCOUNTER — Institutional Professional Consult (permissible substitution): Payer: Medicare Other | Admitting: Psychology

## 2023-10-04 LAB — CUP PACEART REMOTE DEVICE CHECK
Date Time Interrogation Session: 20250713231607
Implantable Pulse Generator Implant Date: 20211103

## 2023-10-10 ENCOUNTER — Encounter

## 2023-10-12 ENCOUNTER — Ambulatory Visit: Payer: Self-pay | Admitting: Cardiovascular Disease

## 2023-10-12 ENCOUNTER — Ambulatory Visit: Admitting: Gastroenterology

## 2023-10-12 ENCOUNTER — Encounter: Payer: Self-pay | Admitting: Physician Assistant

## 2023-10-12 ENCOUNTER — Ambulatory Visit: Admitting: Physician Assistant

## 2023-10-12 ENCOUNTER — Other Ambulatory Visit (INDEPENDENT_AMBULATORY_CARE_PROVIDER_SITE_OTHER)

## 2023-10-12 ENCOUNTER — Telehealth: Payer: Self-pay | Admitting: Gastroenterology

## 2023-10-12 VITALS — BP 130/88 | HR 84 | Ht 62.0 in | Wt 210.0 lb

## 2023-10-12 DIAGNOSIS — D649 Anemia, unspecified: Secondary | ICD-10-CM

## 2023-10-12 DIAGNOSIS — Z9049 Acquired absence of other specified parts of digestive tract: Secondary | ICD-10-CM

## 2023-10-12 DIAGNOSIS — R197 Diarrhea, unspecified: Secondary | ICD-10-CM

## 2023-10-12 DIAGNOSIS — R112 Nausea with vomiting, unspecified: Secondary | ICD-10-CM

## 2023-10-12 DIAGNOSIS — I4891 Unspecified atrial fibrillation: Secondary | ICD-10-CM

## 2023-10-12 DIAGNOSIS — R159 Full incontinence of feces: Secondary | ICD-10-CM

## 2023-10-12 DIAGNOSIS — N390 Urinary tract infection, site not specified: Secondary | ICD-10-CM

## 2023-10-12 DIAGNOSIS — Z860101 Personal history of adenomatous and serrated colon polyps: Secondary | ICD-10-CM | POA: Diagnosis not present

## 2023-10-12 DIAGNOSIS — R1013 Epigastric pain: Secondary | ICD-10-CM

## 2023-10-12 DIAGNOSIS — I480 Paroxysmal atrial fibrillation: Secondary | ICD-10-CM

## 2023-10-12 DIAGNOSIS — R109 Unspecified abdominal pain: Secondary | ICD-10-CM | POA: Diagnosis not present

## 2023-10-12 DIAGNOSIS — R1011 Right upper quadrant pain: Secondary | ICD-10-CM | POA: Diagnosis not present

## 2023-10-12 DIAGNOSIS — K915 Postcholecystectomy syndrome: Secondary | ICD-10-CM

## 2023-10-12 DIAGNOSIS — K922 Gastrointestinal hemorrhage, unspecified: Secondary | ICD-10-CM

## 2023-10-12 LAB — IBC + FERRITIN
Ferritin: 47.6 ng/mL (ref 10.0–291.0)
Iron: 113 ug/dL (ref 42–145)
Saturation Ratios: 31.4 % (ref 20.0–50.0)
TIBC: 359.8 ug/dL (ref 250.0–450.0)
Transferrin: 257 mg/dL (ref 212.0–360.0)

## 2023-10-12 LAB — SEDIMENTATION RATE: Sed Rate: 6 mm/h (ref 0–30)

## 2023-10-12 LAB — CBC WITH DIFFERENTIAL/PLATELET
Basophils Absolute: 0 K/uL (ref 0.0–0.1)
Basophils Relative: 0.6 % (ref 0.0–3.0)
Eosinophils Absolute: 0.1 K/uL (ref 0.0–0.7)
Eosinophils Relative: 1.8 % (ref 0.0–5.0)
HCT: 40.5 % (ref 36.0–46.0)
Hemoglobin: 13 g/dL (ref 12.0–15.0)
Lymphocytes Relative: 42.3 % (ref 12.0–46.0)
Lymphs Abs: 2.2 K/uL (ref 0.7–4.0)
MCHC: 32.1 g/dL (ref 30.0–36.0)
MCV: 93 fl (ref 78.0–100.0)
Monocytes Absolute: 0.4 K/uL (ref 0.1–1.0)
Monocytes Relative: 7.2 % (ref 3.0–12.0)
Neutro Abs: 2.5 K/uL (ref 1.4–7.7)
Neutrophils Relative %: 48.1 % (ref 43.0–77.0)
Platelets: 172 K/uL (ref 150.0–400.0)
RBC: 4.35 Mil/uL (ref 3.87–5.11)
RDW: 12.9 % (ref 11.5–15.5)
WBC: 5.2 K/uL (ref 4.0–10.5)

## 2023-10-12 LAB — URINALYSIS, ROUTINE W REFLEX MICROSCOPIC
Hgb urine dipstick: NEGATIVE
Ketones, ur: NEGATIVE
Nitrite: NEGATIVE
Specific Gravity, Urine: 1.02 (ref 1.000–1.030)
Urine Glucose: NEGATIVE
Urobilinogen, UA: 1 (ref 0.0–1.0)
pH: 6 (ref 5.0–8.0)

## 2023-10-12 LAB — COMPREHENSIVE METABOLIC PANEL WITH GFR
ALT: 26 U/L (ref 0–35)
AST: 31 U/L (ref 0–37)
Albumin: 4.2 g/dL (ref 3.5–5.2)
Alkaline Phosphatase: 93 U/L (ref 39–117)
BUN: 19 mg/dL (ref 6–23)
CO2: 26 meq/L (ref 19–32)
Calcium: 9.4 mg/dL (ref 8.4–10.5)
Chloride: 106 meq/L (ref 96–112)
Creatinine, Ser: 1.1 mg/dL (ref 0.40–1.20)
GFR: 48.58 mL/min — ABNORMAL LOW (ref >60.00)
Glucose, Bld: 100 mg/dL — ABNORMAL HIGH (ref 70–99)
Potassium: 4.4 meq/L (ref 3.5–5.1)
Sodium: 141 meq/L (ref 135–145)
Total Bilirubin: 0.5 mg/dL (ref 0.2–1.2)
Total Protein: 6.8 g/dL (ref 6.0–8.3)

## 2023-10-12 LAB — C-REACTIVE PROTEIN: CRP: <1 mg/dL (ref 0.5–20.0)

## 2023-10-12 LAB — LIPASE: Lipase: 24 U/L (ref 11.0–59.0)

## 2023-10-12 LAB — VITAMIN B12: Vitamin B-12: >1500 pg/mL — ABNORMAL HIGH (ref 211–911)

## 2023-10-12 LAB — CORTISOL: Cortisol, Plasma: 4.6 ug/dL

## 2023-10-12 MED ORDER — SUCRALFATE 1 G PO TABS
1.0000 g | ORAL_TABLET | Freq: Three times a day (TID) | ORAL | 0 refills | Status: DC
Start: 2023-10-12 — End: 2023-10-31

## 2023-10-12 MED ORDER — DICYCLOMINE HCL 20 MG PO TABS: 20.0000 mg | ORAL_TABLET | Freq: Three times a day (TID) | ORAL | 0 refills | Status: DC | PRN

## 2023-10-12 MED ORDER — COLESTIPOL HCL 1 G PO TABS
1.0000 g | ORAL_TABLET | Freq: Two times a day (BID) | ORAL | 0 refills | Status: DC
Start: 2023-10-12 — End: 2023-11-03

## 2023-10-12 NOTE — Progress Notes (Signed)
 Agree with assessment and plan as outlined.

## 2023-10-12 NOTE — Telephone Encounter (Signed)
 Pt states she has been having a lot of uncontrollable diarrhea. Reports she has also been having pain below her sternum and in her right upper quadrant. Requesting to be seen. Pt scheduled to see Alan Coombs PA today at 2:10pm.

## 2023-10-12 NOTE — Telephone Encounter (Signed)
 Patient called and stated that she is needing a soon appointment with Dr. Leigh. Patient was advise his soonest Is September 30 th, patient refused to accept that appointment time. I did offer her to be seen with a PA and there soonest was September the 12 th, patient again refused that appointment. Patient is complaining of Abdominal pain and nausea and vomiting. Please advise.

## 2023-10-12 NOTE — Patient Instructions (Signed)
 _______________________________________________________  If your blood pressure at your visit was 140/90 or greater, please contact your primary care physician to follow up on this.  _______________________________________________________  If you are age 77 or older, your body mass index should be between 23-30. Your Body mass index is 38.41 kg/m. If this is out of the aforementioned range listed, please consider follow up with your Primary Care Provider.  If you are age 3 or younger, your body mass index should be between 19-25. Your Body mass index is 38.41 kg/m. If this is out of the aformentioned range listed, please consider follow up with your Primary Care Provider.   ________________________________________________________  The [facility] providers would like to encourage you to use MYCHART to communicate with providers for non-urgent requests or questions.  Due to long hold times on the telephone, sending your provider a message by [facility] may be a faster and more efficient way to get a response.  Please allow 48 business hours for a response.  Please remember that this is for non-urgent requests.  _______________________________________________________  [facility] is using a team-based approach to care.  Your team is made up of your doctor and two to three APPS. Our APPS (Nurse Practitioners and Physician Assistants) work with your physician to ensure care continuity for you. They are fully qualified to address your health concerns and develop a treatment plan. They communicate directly with your gastroenterologist to care for you. Seeing the Advanced Practice Practitioners on your physician's team can help you by facilitating care more promptly, often allowing for earlier appointments, access to diagnostic testing, procedures, and other specialty referrals.   We have sent the following medications to your pharmacy for you to pick up at your convenience:  START:  colestipol  1g twice daily START: dicyclomine  20mg  one tablet three times daily as needed for spasms. START: sucralfate  1 gm 4 times daily with meals and at bedtime.  Your provider has requested that you go to the basement level for lab work before leaving today. Press B on the elevator. The lab is located at the first door on the left as you exit the elevator.  You have been scheduled for a CT scan of the abdomen and pelvis at [facility], 1st floor Radiology. You are scheduled on 10-21-23 at 3:30pm. You should arrive 15 minutes prior to your appointment time for registration.    If you have any questions regarding your exam or if you need to reschedule, you may call [facility] at [phone] between the hours of 8:00 am and 5:00 pm, Monday-Friday.   Due to recent changes in healthcare laws, you may see the results of your imaging and laboratory studies on MyChart before your provider has had a chance to review them.  We understand that in some cases there may be results that are confusing or concerning to you. Not all laboratory results come back in the same time frame and the provider may be waiting for multiple results in order to interpret others.  Please give us  48 hours in order for your provider to thoroughly review all the results before contacting the office for clarification of your results.   Thank you for entrusting me with your care and choosing [facility].  Alan Coombs, PA-C

## 2023-10-12 NOTE — Progress Notes (Signed)
 10/12/2023 NANIE DUNKLEBERGER [id number] 1945/10/03  Referring provider: Loreli Elsie JONETTA Mickey., MD Primary GI doctor: Dr. Leigh  ASSESSMENT AND PLAN:   RUQ/Epigastric pain post prandial after eating cookies GERD with history of hiatal hernia, status post sleeve gastrectomy S/p cholecystectomy  EGD 10/09/2019 3 cm HH otherwise normal empiric dilation of esophagus 17-18 gastric sleeve healthy appearing mucosa normal duodenum She has vomiting worse later in the day/worse lying down, early satiety, no bloating, no distension, no dysphagia Had medrol  dose pack 2 months ago, no NSAIDS, no ETOH - get CT AB and pelvis with contrast - continue pantoprazole  40 mg BID, add on carafate  -Lifestyle changes discussed, avoid NSAIDS, ETOH, hand out given to the patient -Weight loss discussed with the patient -pending labs and CT will schedule for repeat EGD to evaluate -Consider GES, gastroparesis diet given - ER precautions disucssed  AB pain x 1 weeks with worsening diarrhea and fecal incontinence  Life long stool issues worse since her sleeve gastrectomy and worse last 6 months Worse after eating, will yellow bile, can have hard pieces No further bleeding, negative FOBT in office with decreased rectal tone - stool samples to rule out infection  -CRP/ESR/ fecal calprotectin if elevated consider VCE -Can do trial of IBGARD daily,  Levsin as needed -Add on citracel/benefiber, FODMAP, trial off lactulose and lifestyle changes discussed -Will do trial of colestipol  for possible bile acid diarrhea - possible pelvic floor component, consider referral to PT, information given -Consider SIBO testing or xifaxin trial pending results  History of GI bleed  Colonoscopy 2021 significant diverticulosis September 2024 negative CTA RBC dropped 3 points tagged RBC - January 2025 ER for hematochezia CTA negative 03/30/2023 colonoscopy for bleeding normal TI, tics and 2 ulcers at ileocecal valve status post  clips Path negative chronicity, granulomas, viral, dysplasia no IBD. Negative FOBT in the office Consider capsule endoscopy  Personal history of adenomatous polyps 03/30/2023 colonoscopy Dr. Leigh for lower GI bleeding normal TI, diverticulosis entire examined colon, 2 ulcers at the ileocecal valve biopsies clips placed otherwise unremarkable.  A-fib Follows Dr. Nancey last seen June 2025 Loop recorder with no significant A-fib detected consider watchman if increased burden  Patient Care Team: Loreli Elsie JONETTA Mickey., MD as PCP - General (Internal Medicine) Court Dorn PARAS, MD as PCP - Cardiology (Cardiology) Mealor, Eulas BRAVO, MD as PCP - Electrophysiology (Cardiology)  HISTORY OF PRESENT ILLNESS: The patient presents with chronic diarrhea and new onset abdominal pain.  She has experienced chronic diarrhea for several years, which has worsened over time. The diarrhea is yellow, sometimes with a jelly-like consistency, and occasionally contains small hard pieces. She experiences urgency and incontinence, causing significant distress. The diarrhea has been present since her sleeve gastrectomy in 2016, although she has had stomach issues since college. No recent bleeding has occurred since her last colonoscopy in January 2025, which revealed two ulcers.  She describes a new onset of severe abdominal pain that began about a week ago after consuming hard vanilla cookies. The pain was intense, located below the stomach and above the belly button, and felt like 'knives.' It was severe enough to cause her to cancel appointments and go to bed. Although the pain has not recurred to the same extent, she continues to experience discomfort on the right side near the rib cage. She has a history of gallbladder removal, possibly during her sleeve gastrectomy.  She experiences frequent vomiting, especially if she eats after supper and then lies down.  She manages this with ondansetron . Despite feeling full  quickly, she has not lost weight. She is on pantoprazole  twice a day, which was started during a previous hospital stay.  She reports dizziness that has worsened over the past week, particularly with movement. She has a history of low iron following a significant bleed, for which she received iron infusions. She is not currently on iron supplements.  She denies any new medications or antibiotics in the past six months, except for a prednisone  pack for a suspected sinus infection. She does not consume alcohol  and primarily uses Tylenol  for pain relief. She drinks decaffeinated sweet tea with sweet and low, which she has used for many years  She  reports that she has never smoked. She has never used smokeless tobacco. She reports that she does not drink alcohol  and does not use drugs.  RELEVANT GI HISTORY, IMAGING AND LABS: Results         CTA 03/26/22: IMPRESSION: VASCULAR 1. No evidence for active gastrointestinal bleeding.    NON-VASCULAR   1. No acute localizing process in the abdomen or pelvis. 2. Colonic diverticulosis. 3. Small hiatal hernia. 4. Left Bosniak I benign renal cyst measuring 1.2 cm. No follow-up imaging recommended.       Tagged RBC scan 12/03/2022: FINDINGS: No abnormal areas of increased radiotracer uptake identified to indicate active GI bleeding. Normal physiologic distribution of the radiopharmaceutical is noted within the blood pool, liver, spleen and urinary bladder.   IMPRESSION: Examination is negative for active GI bleeding    CTA 11/30/2022: FINDINGS: VASCULAR   Aorta: Moderate partially calcified atheromatous plaque without aneurysm, dissection, for for   Celiac: Calcified ostial plaque without significant stenosis, patent distally.   SMA: Eccentric short-segment calcified ostial plaque without high-grade stenosis, patent distally with classic branch anatomy.   Renals: Single bilaterally, both with calcified ostial plaque resulting in mild  stenosis, patent distally.   IMA: Patent without evidence of aneurysm, dissection, vasculitis or significant stenosis.   Inflow: Mild scattered calcified plaque in bilateral common and internal iliac arteries. No aneurysm or stenosis. External iliac arteries normal.   Proximal Outflow: Bilateral common femoral and visualized portions of the superficial and profunda femoral arteries are patent without evidence of aneurysm, dissection, vasculitis or significant stenosis.   Veins: Patent hepatic veins, portal vein, SMV, splenic vein, bilateral renal veins, iliac venous system and IVC.   Review of the MIP images confirms the above findings.   NON-VASCULAR   Lower chest: No pleural or pericardial effusion. Visualized lung bases clear.   Hepatobiliary: No focal liver abnormality is seen. Status post cholecystectomy. Ectatic central intrahepatic biliary tree and CBD, stable.   Pancreas: Unremarkable. No pancreatic ductal dilatation or surrounding inflammatory changes.   Spleen: Normal in size without focal abnormality.   Adrenals/Urinary Tract: Chronic linear right adrenal calcifications without mass. No urolithiasis. No hydronephrosis. Stable 1.5 cm left upper pole cyst; no follow-up recommended. Urinary bladder physiologically distended.   Stomach/Bowel: No active GI bleed. Moderate hiatal hernia. Post gastric bypass surgery. Small bowel decompressed. Appendix not identified. The colon is partially distended with multiple scattered diverticula throughout, most numerous in the distal descending and sigmoid segments, without adjacent inflammatory change.   Lymphatic: No abdominal or pelvic adenopathy.   Reproductive: Status post hysterectomy. No adnexal masses.   Other: No ascites.  No free air.   Musculoskeletal: Mild spondylitic changes throughout the visualized lower thoracic and lumbar spine. Bilateral hip arthroplasty components project in expected location. No  fracture  or worrisome bone lesion.   IMPRESSION: 1. No active GI bleed. 2. Colonic diverticulosis without acute inflammation. 3. Moderate hiatal hernia. 4. Aortic Atherosclerosis     MOST RECENT GI PROCEDURES:   Colonoscopy 10/18/2019 by Dr. Leigh: - The examined portion of the ileum was normal.  - Diverticulosis in the entire examined colon. - Three 2 to 3 mm polyps in the transverse colon, removed with a cold biopsy forceps. Resected and retrieved.  - Internal hemorrhoids, small.  - The examination was otherwise normal. Brown stool throughout, no heme appreciated. Suspect the patient has had low grade stuttering diverticular bleed, hopefully resolved with holding Eliquis  for the past 5 days.  -Consider a repeat colonoscopy in 5 to 7 years   Surgical [P], colon, transverse, polyps (3) - TUBULAR ADENOMA (5 OF 6 FRAGMENTS) - BENIGN COLONIC MUCOSA (1 OF 6 FRAGMENTS) - NO HIGH GRADE DYSPLASIA OR MALIGNANCY IDENTIFIED   EGD 09/20/2019: - Esophagogastric landmarks identified.  - 3 cm hiatal hernia.  - Normal esophagus otherwise  - Empiric dilation performed at 17mm and 18mm as above  - A sleeve gastrectomy was found, characterized by healthy appearing mucosa.  - Normal duodenal bulb and second portion of the duodenum.   EGD 04/15/2017: - Esophagogastric landmarks identified.  - Normal esophagus - A sleeve gastrectomy was found, characterized by healthy appearing mucosa.  - Erythematous mucosa in the antrum.  - Normal duodenal bulb and second portion of the duodenum.  - Biopsies were taken with a cold forceps for Helicobacter pylori testing.   Colonoscopy 04/15/2017: - The examined portion of the ileum was normal.  - Diverticulosis in the entire examined colon.  - The examination was otherwise normal on direct and retroflexion views.  - Biopsies were taken with a cold forceps from the right colon, left colon and transverse colon for evaluation of microscopic colitis.   CBC     Component Value Date/Time   WBC 6.3 03/27/2023 1249   RBC 4.00 03/27/2023 1249   HGB 12.4 03/29/2023 1209   HGB 13.0 02/15/2017 1207   HCT 37.3 03/27/2023 1249   HCT 40.0 02/15/2017 1207   PLT 243 03/27/2023 1249   MCV 93.3 03/27/2023 1249   MCV 94 02/15/2017 1207   MCH 30.3 03/27/2023 1249   MCHC 32.4 03/27/2023 1249   RDW 15.9 (H) 03/27/2023 1249   RDW 13.3 02/15/2017 1207   LYMPHSABS 1.6 03/27/2023 1249   LYMPHSABS 1.4 02/15/2017 1207   MONOABS 0.4 03/27/2023 1249   EOSABS 0.0 03/27/2023 1249   EOSABS 0.1 02/15/2017 1207   BASOSABS 0.0 03/27/2023 1249   BASOSABS 0.0 02/15/2017 1207   Recent Labs    12/01/22 0843 12/02/22 0547 12/03/22 0609 12/03/22 1735 12/04/22 0743 12/05/22 0754 12/09/22 1313 01/31/23 1213 03/27/23 1249 03/29/23 1209  HGB 8.9* 8.3* 8.3* 8.9* 9.1* 8.5* 9.0* 12.0 12.1 12.4    CMP     Component Value Date/Time   NA 139 03/27/2023 1249   NA 145 (H) 05/09/2019 1057   K 4.1 03/27/2023 1249   CL 106 03/27/2023 1249   CO2 20 (L) 03/27/2023 1249   GLUCOSE 122 (H) 03/27/2023 1249   BUN 19 03/29/2023 1209   BUN 17 05/09/2019 1057   CREATININE 1.27 (H) 03/27/2023 1249   CALCIUM  9.2 03/27/2023 1249   PROT 5.8 (L) 03/27/2023 1249   PROT 6.4 11/17/2017 1230   ALBUMIN 2.9 (L) 03/27/2023 1249   ALBUMIN 4.3 11/17/2017 1230   AST 22 03/27/2023 1249   ALT 16  03/27/2023 1249   ALKPHOS 53 03/27/2023 1249   BILITOT 0.5 03/27/2023 1249   BILITOT 0.3 11/17/2017 1230   GFRNONAA 44 (L) 03/27/2023 1249   GFRAA >60 10/15/2019 0430      Latest Ref Rng & Units 03/27/2023   12:49 PM 01/31/2023   12:13 PM 12/02/2022    5:47 AM  Hepatic Function  Total Protein 6.5 - 8.1 g/dL 5.8  6.3  4.7   Albumin 3.5 - 5.0 g/dL 2.9  4.0  2.7   AST 15 - 41 U/L 22  21  20    ALT 0 - 44 U/L 16  16  18    Alk Phosphatase 38 - 126 U/L 53  61  46   Total Bilirubin 0.0 - 1.2 mg/dL 0.5  0.4  0.3       Current Medications:    Current Outpatient Medications (Cardiovascular):     colestipol  (COLESTID ) 1 g tablet, Take 1 tablet (1 g total) by mouth 2 (two) times daily.   diltiazem  (CARDIZEM  CD) 240 MG 24 hr capsule, TAKE 1 CAPSULE BY MOUTH EVERY DAY   losartan  (COZAAR ) 50 MG tablet, Take 50 mg by mouth daily.   simvastatin  (ZOCOR ) 20 MG tablet, Take 1 tablet (20 mg total) by mouth daily.  Current Outpatient Medications (Respiratory):    albuterol  (PROVENTIL  HFA;VENTOLIN  HFA) 108 (90 Base) MCG/ACT inhaler, Inhale 2 puffs into the lungs every 6 (six) hours as needed for shortness of breath or wheezing.   loratadine  (CLARITIN ) 10 MG tablet, Take 10 mg by mouth daily.  Current Outpatient Medications (Analgesics):    acetaminophen  (TYLENOL ) 500 MG tablet, Take 1,000 mg by mouth every 6 (six) hours as needed (for pain.).    Current Outpatient Medications (Other):    ALPRAZolam  (XANAX ) 0.25 MG tablet, Take 0.25 mg by mouth daily as needed for anxiety.   cyclobenzaprine (FLEXERIL) 10 MG tablet, Take 10 mg by mouth 3 (three) times daily as needed for muscle spasms.   dicyclomine  (BENTYL ) 20 MG tablet, Take 1 tablet (20 mg total) by mouth 3 (three) times daily as needed for spasms.   DULoxetine  (CYMBALTA ) 30 MG capsule, 1 capsule Orally Once a day at supper X 7 days for 7 days   famotidine (PEPCID) 20 MG tablet, Take 20 mg by mouth 2 (two) times daily.   Melatonin 10 MG TABS, Take 10 mg by mouth at bedtime.   ondansetron  (ZOFRAN -ODT) 8 MG disintegrating tablet, DISSOLVE 1 TABLET IN MOUTH EVERY 8 HRS AS NEEDED NAUSEA   pantoprazole  (PROTONIX ) 40 MG tablet, TAKE 1 TABLET (40 MG TOTAL) BY MOUTH TWICE A DAY BEFORE MEALS   sucralfate  (CARAFATE ) 1 g tablet, Take 1 tablet (1 g total) by mouth 4 (four) times daily -  with meals and at bedtime.   memantine  (NAMENDA ) 10 MG tablet, TAKE 1 TABLET BY MOUTH TWICE A DAY  Medical History:  Past Medical History:  Diagnosis Date   Allergy    Anemia    Antral polyp    benign   Anxiety    Arthritis    Asthma    B12 deficiency     Back pain    Chest pain    Constipation    Depression    Diverticulosis    Dizzinesses    Gallbladder problem    GERD (gastroesophageal reflux disease) 09/09/2011   History of transfusion    HTN (hypertension) 09/09/2011   Hyperlipemia 09/09/2011   Hypertension    IBS (irritable bowel syndrome)  Internal hemorrhoid    Joint pain    Obesity    Osteoarthritis    Paroxysmal atrial fibrillation (HCC)    Pneumonia    PONV (postoperative nausea and vomiting)    with big surgeries, none with endos etc, with colonoscopy- 5-6 years ago could not swallow    Shortness of breath    Sleep apnea    borderline per patient no cpap    SOB (shortness of breath) on exertion    Swallowing difficulty    Tachycardia    Unstable angina (HCC) 09/09/2011   Vitamin D  deficiency    Vocal cord dysfunction 2013   swelling   Allergies:  Allergies  Allergen Reactions   Ativan  [Lorazepam ] Other (See Comments)    Agitation, aggressive actions, significant mental changes   Cefdinir Hives    headache   Buprenorphine Hcl Itching and Rash     Surgical History:  She  has a past surgical history that includes Total knee arthroplasty (Bilateral); Cholecystectomy; Abdominal hysterectomy (1988); Tumor excision (1964); Wrist surgery (Left, 1997, 2009 ); Elbow surgery (Left); Foot osteotomy w/ plantar fascia release (Left); Total ankle replacement (Right); Total ankle replacement (Left); Ankle Fusion (Right); thumb replacement (Bilateral); Back surgery [phone]); Nasal sinus surgery (1997); Esophagogastroduodenoscopy ([name], 03/06/2013); BRAVO ph study ([name], 03/06/2013); tfc wrist (Left, 1997); left heart catheterization with coronary angiogram (Bilateral, 09/10/2011); Laparoscopic gastric sleeve resection with hiatal hernia repair (08/20/2014); Upper gi endoscopy (08/20/2014); Cardiac catheterization (2013); transthoracic echocardiogram (09/09/2011); Bilateral anterior total hip arthroplasty (Bilateral,  11/12/2014); Shoulder surgery (Left); Total ankle replacement; Total hip arthroplasty (Bilateral); Joint replacement; Total shoulder arthroplasty (Right, 05/14/2016); Iliotibial band release (Right, 1997); Reverse shoulder arthroplasty (Right, 05/14/2016); Colonoscopy; implantable loop recorder placement (01/23/2020); and Reverse shoulder arthroplasty (Left, 05/16/2020). Family History:  Her family history includes Alcoholism in her father; Arthritis in her mother; Cancer in her father and mother; Diabetes in her mother; Esophageal cancer in her mother; Esophageal cancer (age of onset: 37) in her father; Heart disease in her maternal grandmother; Hypertension in her mother and sister; Other in her sister; Stroke in her paternal grandmother and sister.  REVIEW OF SYSTEMS  : All other systems reviewed and negative except where noted in the History of Present Illness.  PHYSICAL EXAM: BP 130/88   Pulse 84   Ht 5' 2 (1.575 m)   Wt 210 lb (95.3 kg)   BMI 38.41 kg/m  GENERAL APPEARANCE: Well nourished, in no apparent distress. HEENT: No cervical lymphadenopathy, unremarkable thyroid , sclerae anicteric, conjunctiva pink. RESPIRATORY: Respiratory effort normal, breath sounds equal bilaterally without rales, rhonchi, or wheezing. CARDIO: Regular rate and rhythm with no murmurs, rubs, or gallops, peripheral pulses intact. ABDOMEN: Soft, non-distended, active bowel sounds in all four quadrants, tenderness to palpation, no rebound tenderness, no mass appreciated. Pain radiating from abdomen to back. RECTAL: Increased rectal tone, no masses, stool hemoccult negative. MUSCULOSKELETAL: Full range of motion, normal gait, without edema. SKIN: Dry, intact without rashes or lesions. No jaundice. NEURO: Alert, oriented, no focal deficits. PSYCH: Cooperative, normal mood and affect.  Alan JONELLE Coombs, PA-C 3:52 PM

## 2023-10-13 ENCOUNTER — Telehealth: Payer: Self-pay | Admitting: Physician Assistant

## 2023-10-13 ENCOUNTER — Other Ambulatory Visit (HOSPITAL_COMMUNITY): Payer: Self-pay

## 2023-10-13 ENCOUNTER — Encounter: Payer: Self-pay | Admitting: Gastroenterology

## 2023-10-13 ENCOUNTER — Ambulatory Visit: Payer: Self-pay | Admitting: Physician Assistant

## 2023-10-13 ENCOUNTER — Telehealth: Payer: Self-pay

## 2023-10-13 ENCOUNTER — Encounter: Payer: Medicare Other | Admitting: Psychology

## 2023-10-13 DIAGNOSIS — N390 Urinary tract infection, site not specified: Secondary | ICD-10-CM

## 2023-10-13 LAB — URINE CULTURE
MICRO NUMBER:: 16735620
SPECIMEN QUALITY:: ADEQUATE

## 2023-10-13 MED ORDER — CIPROFLOXACIN HCL 500 MG PO TABS: 500.0000 mg | ORAL_TABLET | Freq: Two times a day (BID) | ORAL | 0 refills | Status: AC

## 2023-10-13 NOTE — Telephone Encounter (Signed)
 Pharmacy Patient Advocate Encounter  Received notification from [employer] [facility] [employer] that Prior Authorization for Dicyclomine  HCl 20MG  tablets has been APPROVED from 03-23-2023 to 10-12-2024   PA #/Case ID/Reference #: [id number]

## 2023-10-13 NOTE — Telephone Encounter (Signed)
 PT is calling to go over lab results. I let the patient know that results had not been reviewed by provider just yet. Please advise

## 2023-10-13 NOTE — Telephone Encounter (Signed)
 Will contact patient once results have been reviewed.

## 2023-10-13 NOTE — Telephone Encounter (Signed)
 Pharmacy Patient Advocate Encounter   Received notification from CoverMyMeds that prior authorization for Dicyclomine  HCl 20MG  tablets is required/requested.   Insurance verification completed.   The patient is insured through [employer] [facility] [employer] .   Per test claim: PA required; PA submitted to above mentioned insurance via CoverMyMeds Key/confirmation #/EOC [id number] Status is pending

## 2023-10-14 NOTE — Telephone Encounter (Signed)
This has been addressed.  See result note.

## 2023-10-17 LAB — CALPROTECTIN, FECAL

## 2023-10-21 ENCOUNTER — Ambulatory Visit (HOSPITAL_COMMUNITY)
Admission: RE | Admit: 2023-10-21 | Discharge: 2023-10-21 | Disposition: A | Discharge status: Home / Self Care | Source: Ambulatory Visit | Attending: Physician Assistant | Admitting: Physician Assistant

## 2023-10-21 DIAGNOSIS — R109 Unspecified abdominal pain: Secondary | ICD-10-CM | POA: Insufficient documentation

## 2023-10-21 DIAGNOSIS — K449 Diaphragmatic hernia without obstruction or gangrene: Secondary | ICD-10-CM | POA: Diagnosis not present

## 2023-10-21 DIAGNOSIS — R197 Diarrhea, unspecified: Secondary | ICD-10-CM | POA: Diagnosis not present

## 2023-10-21 DIAGNOSIS — K5730 Diverticulosis of large intestine without perforation or abscess without bleeding: Secondary | ICD-10-CM | POA: Diagnosis not present

## 2023-10-21 MED ORDER — SODIUM CHLORIDE (PF) 0.9 % IJ SOLN
INTRAMUSCULAR | Status: AC
  Filled 2023-10-21: qty 50

## 2023-10-21 MED ORDER — IOHEXOL 300 MG/ML  SOLN
80.0000 mL | Freq: Once | INTRAMUSCULAR | Status: AC | PRN
  Administered 2023-10-21: 80 mL via INTRAVENOUS

## 2023-10-24 NOTE — Telephone Encounter (Signed)
 Lab and urine results faxed to PCP via Epic. Will electronically fax [employer] results as well.

## 2023-10-26 DIAGNOSIS — K219 Gastro-esophageal reflux disease without esophagitis: Secondary | ICD-10-CM | POA: Diagnosis not present

## 2023-10-26 DIAGNOSIS — H8110 Benign paroxysmal vertigo, unspecified ear: Secondary | ICD-10-CM | POA: Diagnosis not present

## 2023-10-26 DIAGNOSIS — R1013 Epigastric pain: Secondary | ICD-10-CM | POA: Diagnosis not present

## 2023-10-26 DIAGNOSIS — K529 Noninfective gastroenteritis and colitis, unspecified: Secondary | ICD-10-CM | POA: Diagnosis not present

## 2023-10-26 NOTE — Progress Notes (Signed)
 [facility] Summary Report / Loop Recorder

## 2023-10-29 ENCOUNTER — Other Ambulatory Visit: Payer: Self-pay | Admitting: Cardiovascular Disease

## 2023-10-29 ENCOUNTER — Other Ambulatory Visit: Payer: Self-pay | Admitting: Physician Assistant

## 2023-10-30 ENCOUNTER — Other Ambulatory Visit: Payer: Self-pay | Admitting: Physician Assistant

## 2023-11-03 ENCOUNTER — Ambulatory Visit (INDEPENDENT_AMBULATORY_CARE_PROVIDER_SITE_OTHER)

## 2023-11-03 ENCOUNTER — Other Ambulatory Visit: Payer: Self-pay | Admitting: Physician Assistant

## 2023-11-03 DIAGNOSIS — I480 Paroxysmal atrial fibrillation: Secondary | ICD-10-CM

## 2023-11-03 LAB — CUP PACEART REMOTE DEVICE CHECK
Date Time Interrogation Session: 20250813231142
Implantable Pulse Generator Implant Date: 20211103

## 2023-11-06 ENCOUNTER — Ambulatory Visit: Payer: Self-pay | Admitting: Cardiovascular Disease

## 2023-11-14 ENCOUNTER — Encounter

## 2023-11-15 ENCOUNTER — Encounter (INDEPENDENT_AMBULATORY_CARE_PROVIDER_SITE_OTHER): Payer: Self-pay | Admitting: Otolaryngology

## 2023-11-15 ENCOUNTER — Ambulatory Visit (INDEPENDENT_AMBULATORY_CARE_PROVIDER_SITE_OTHER): Payer: Medicare Other | Admitting: Audiology

## 2023-11-15 ENCOUNTER — Ambulatory Visit (INDEPENDENT_AMBULATORY_CARE_PROVIDER_SITE_OTHER): Payer: Medicare Other | Admitting: Otolaryngology

## 2023-11-15 VITALS — BP 117/59 | HR 80

## 2023-11-15 DIAGNOSIS — R42 Dizziness and giddiness: Secondary | ICD-10-CM | POA: Diagnosis not present

## 2023-11-15 DIAGNOSIS — R0981 Nasal congestion: Secondary | ICD-10-CM

## 2023-11-15 DIAGNOSIS — H903 Sensorineural hearing loss, bilateral: Secondary | ICD-10-CM

## 2023-11-15 DIAGNOSIS — J310 Chronic rhinitis: Secondary | ICD-10-CM

## 2023-11-15 DIAGNOSIS — J343 Hypertrophy of nasal turbinates: Secondary | ICD-10-CM | POA: Diagnosis not present

## 2023-11-15 DIAGNOSIS — H6121 Impacted cerumen, right ear: Secondary | ICD-10-CM

## 2023-11-15 NOTE — Progress Notes (Unsigned)
  [address], [address] [place], [place] [address] [phone]  Hearing Aid Check     [name] comes for a scheduled appointment for a hearing aid check.  Time in:*** Time out:*** Accompanied by:***   Right Left  Hearing aid manufacturer    Hearing aid style    Hearing aid battery    Receiver    Dome/ custom earpiece    Retention wire    Warranty expiration date    Loss and Damage    Additional accessories Expiration date    Initial fitting date    Device was fit at:    Hearing aid services Bundled until:  Bundled until:     Chief complaint: Patient reports ***.  Actions taken: Inspection of the device and listening check showed that both were working well. Had her use small power domes. Ran feedback test. Patient doe snot want to re- order her custom earpiece .  Services fee: $60 was paid at checkout.  Patient was oriented about allowing 2-3 weeks to get used to the changes in the hearing aid settings.  Recommend: Return for a hearing aid check ***. Return for a hearing evaluation and to see an ENT, if concerns with hearing changes arise. ***  Caius Silbernagel MARIE LEROUX-MARTINEZ, AUD

## 2023-11-15 NOTE — Progress Notes (Unsigned)
 Patient ID: [name], female   DOB: 21-Jan-1946, 78 y.o.   MRN: [id number]  Follow-up: Dizziness, bilateral hearing loss, bilateral tinnitus, chronic nasal congestion  HPI: 6/25 The patient is a 78 year old female who returns today for her follow-up evaluation.  The patient was previously seen for her chronic nasal congestion, recurrent dizziness, bilateral hearing loss, and bilateral tinnitus.  She was previously noted to have peripheral and central vestibular dysfunction.  She was treated with vestibular rehabilitation/physical therapy.  Her chronic rhinitis was treated with Flonase  and prednisone .  The patient returns today reporting improvement in her nasal congestion.  However, she still has minimal smell or taste sensation.  She denies any change in her hearing.  She is using bilateral hearing aids.  She is still having bilateral tinnitus.  She also has frequent dizziness.  He describes the dizziness as an off-balance and lightheaded sensation.  Exam: General: Communicates without difficulty, well nourished, no acute distress. Head: Normocephalic, no evidence injury, no tenderness, facial buttresses intact without stepoff. Face/sinus: No tenderness to palpation and percussion. Facial movement is normal and symmetric. Eyes: PERRL, EOMI. No scleral icterus, conjunctivae clear. Neuro: CN II exam reveals vision grossly intact.  No nystagmus at any point of gaze. Ears: Auricles well formed without lesions.  Ear canals are intact without mass or lesion.  No erythema or edema is appreciated.  The TMs are intact without fluid. Nose: External evaluation reveals normal support and skin without lesions.  Dorsum is intact.  Anterior rhinoscopy reveals congested mucosa over anterior aspect of inferior turbinates and intact septum.  No purulence noted. Oral:  Oral cavity and oropharynx are intact, symmetric, without erythema or edema.  Mucosa is moist without lesions. Neck: Full range of motion without pain.   There is no significant lymphadenopathy.  No masses palpable.  Thyroid  bed within normal limits to palpation.  Parotid glands and submandibular glands equal bilaterally without mass.  Trachea is midline. Neuro:  CN 2-12 grossly intact. Gait wide-based. Vestibular: No nystagmus at any point of gaze. Dix Hallpike negative. The cerebellar examination is unremarkable. Vestibular: There is no nystagmus with pneumatic pressure on either tympanic membrane or Valsalva.    Her hearing test shows stable bilateral high-frequency sensorineural hearing loss.  Assessment: 1.  Recurrent dizziness, a result of her peripheral and central vestibular dysfunction.  The patient also has multiple risk factors, including history of bilateral knee, hip, and ankle replacement surgeries.  She was also diagnosed with cognitive memory loss by her neurologist.  2.  Stable bilateral high-frequency sensorineural hearing loss and tinnitus. 3.  Chronic rhinitis with nasal mucosal congestion and bilateral inferior turbinate hypertrophy.  The severity of her nasal congestion has decreased.  Plan: 1.  The physical exam findings and hearing test results are reviewed with the patient. 2.  Continue the use of her hearing aids. 3.  Continue with her balance exercises at home. 4.  The strategies to cope with tinnitus are reviewed. 5.  Continue with Flonase  nasal spray and Valsalva exercise. 6.  The pathophysiology of dizziness and bilateral vestibular dysfunction are again discussed.  The benefit of vestibular exercises are explained. 7.  The patient is encouraged to call with any questions or concerns.

## 2023-11-16 DIAGNOSIS — H6121 Impacted cerumen, right ear: Secondary | ICD-10-CM | POA: Insufficient documentation

## 2023-11-18 ENCOUNTER — Ambulatory Visit: Attending: Internal Medicine

## 2023-11-18 VITALS — BP 122/81 | HR 81

## 2023-11-18 DIAGNOSIS — R2681 Unsteadiness on feet: Secondary | ICD-10-CM | POA: Insufficient documentation

## 2023-11-18 DIAGNOSIS — R42 Dizziness and giddiness: Secondary | ICD-10-CM | POA: Insufficient documentation

## 2023-11-18 NOTE — Therapy (Signed)
 OUTPATIENT PHYSICAL THERAPY VESTIBULAR EVALUATION     Patient Name: [name] MRN: [id number] DOB:[birth year], 78 y.o., female Today's Date: 11/18/2023  END OF SESSION:  PT End of Session - 11/18/23 1308     Visit Number 1    Number of Visits 9    Date for PT Re-Evaluation 12/16/23    Authorization Type Medicare/ [employer]    PT Start Time 1318    PT Stop Time 1403    PT Time Calculation (min) 45 min    Activity Tolerance Patient tolerated treatment well    Behavior During Therapy Anxious;Lability          Past Medical History:  Diagnosis Date   Allergy    Anemia    Antral polyp    benign   Anxiety    Arthritis    Asthma    B12 deficiency    Back pain    Chest pain    Constipation    Depression    Diverticulosis    Dizzinesses    Gallbladder problem    GERD (gastroesophageal reflux disease) 09/09/2011   History of transfusion    HTN (hypertension) 09/09/2011   Hyperlipemia 09/09/2011   Hypertension    IBS (irritable bowel syndrome)    Internal hemorrhoid    Joint pain    Obesity    Osteoarthritis    Paroxysmal atrial fibrillation (HCC)    Pneumonia    PONV (postoperative nausea and vomiting)    with big surgeries, none with endos etc, with colonoscopy- 5-6 years ago could not swallow    Shortness of breath    Sleep apnea    borderline per patient no cpap    SOB (shortness of breath) on exertion    Swallowing difficulty    Tachycardia    Unstable angina (HCC) 09/09/2011   Vitamin D  deficiency    Vocal cord dysfunction 2013   swelling   Past Surgical History:  Procedure Laterality Date   ABDOMINAL HYSTERECTOMY  1988   ANKLE FUSION Right    x 2   BACK SURGERY  [phone]   3 ruptured discs, lower back   BILATERAL ANTERIOR TOTAL HIP ARTHROPLASTY Bilateral 11/12/2014   Procedure: BILATERAL ANTERIOR TOTAL HIP ARTHROPLASTY;  Surgeon: Donnice Car, MD;  Location: [facility];  Service: Orthopedics;  Laterality: Bilateral;   BRAVO PH STUDY [name]  03/06/2013   Procedure: BRAVO PH STUDY;  Surgeon: Lamar JONETTA Aho, MD;  Location: [facility];  Service: Endoscopy;  Laterality: [name];   CARDIAC CATHETERIZATION  2013   NORMAL   CHOLECYSTECTOMY     COLONOSCOPY     ELBOW SURGERY Left    removed bone chip   ESOPHAGOGASTRODUODENOSCOPY [name] 03/06/2013   Procedure: ESOPHAGOGASTRODUODENOSCOPY (EGD);  Surgeon: Lamar JONETTA Aho, MD;  Location: [facility];  Service: Endoscopy;  Laterality: [name];   FOOT OSTEOTOMY W/ PLANTAR FASCIA RELEASE Left    ILIOTIBIAL BAND RELEASE Right 1997   implantable loop recorder placement  01/23/2020   Medtronic Reveal Linq model LNQ 22 (RLB [id number]) implantable loop recorder   JOINT REPLACEMENT     LAPAROSCOPIC GASTRIC SLEEVE RESECTION WITH HIATAL HERNIA REPAIR  08/20/2014   Procedure: LAPAROSCOPIC GASTRIC SLEEVE RESECTION WITH HIATAL HERNIA  REPAIR AND  UPPER ENDOSCOPY;  Surgeon: Donnice Lunger, MD;  Location: [facility];  Service: General;;   LEFT HEART CATHETERIZATION WITH CORONARY ANGIOGRAM Bilateral 09/10/2011   Procedure: LEFT HEART CATHETERIZATION WITH CORONARY ANGIOGRAM;  Surgeon: Dorn JINNY Lesches, MD;  Location: [facility];  Service:  Cardiovascular;  Laterality: Bilateral;   NASAL SINUS SURGERY  1997   REVERSE SHOULDER ARTHROPLASTY Right 05/14/2016   Procedure: RIGHT REVERSE SHOULDER ARTHROPLASTY;  Surgeon: Marcey Her, MD;  Location: [facility];  Service: Orthopedics;  Laterality: Right;   REVERSE SHOULDER ARTHROPLASTY Left 05/16/2020   Procedure: REVERSE SHOULDER ARTHROPLASTY;  Surgeon: Her Marcey, MD;  Location: [facility];  Service: Orthopedics;  Laterality: Left;  interscalene exparel    SHOULDER SURGERY Left    tfc wrist Left 1997   thumb replacement Bilateral    one 2009 and one 2014   TOTAL ANKLE REPLACEMENT Right    x 3   TOTAL ANKLE REPLACEMENT Left    TOTAL ANKLE REPLACEMENT     TOTAL HIP ARTHROPLASTY Bilateral    TOTAL KNEE ARTHROPLASTY Bilateral    left 2002, right 2012   TOTAL SHOULDER ARTHROPLASTY  Right 05/14/2016   TRANSTHORACIC ECHOCARDIOGRAM  09/09/2011   MILD CONCENTRIC LVH. TRACE MITRAL REGURG. TRACE TR. MILD AORTIC REGURG.   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   UPPER GI ENDOSCOPY  08/20/2014   Procedure: UPPER GI ENDOSCOPY;  Surgeon: Donnice Lunger, MD;  Location: [facility];  Service: General;;   WRIST SURGERY Left 1997, 2009    Patient Active Problem List   Diagnosis Date Noted   Impacted cerumen of right ear 11/16/2023   Hypertrophy of nasal turbinates 08/31/2023   Dizziness 08/06/2023   Sensorineural hearing loss, bilateral 08/06/2023   Tinnitus of both ears 08/06/2023   Chronic rhinitis 08/06/2023   Hematochezia 12/02/2022   Diverticular hemorrhage 12/02/2022   Rectal bleeding 11/30/2022   H/O total shoulder replacement, left 05/16/2020   Reactive airway disease 11/22/2019   GI bleed 10/14/2019   Elevated serum creatinine 10/14/2019   Chronic diarrhea 01/31/2018   Irritable bowel syndrome with diarrhea 01/31/2018   Bloating 01/31/2018   Generalized abdominal pain 01/31/2018   Colon cancer screening 03/24/2017   Insulin  resistance 02/15/2017   Vitamin D  deficiency 02/15/2017   High serum vitamin B12 02/15/2017   S/P shoulder replacement, right 05/14/2016   Acute blood loss anemia 12/04/2014   Obese 11/13/2014   S/P bilateral THA, AA 11/12/2014   Confusion 10/13/2014   Acute confusional state 10/13/2014   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 08/20/2014   Aftercare following joint replacement 03/28/2014   History of artificial joint 03/28/2014   Degenerative arthritis of hip 09/27/2013   Paroxysmal atrial fibrillation (HCC) 09/07/2013   Tachycardia 07/03/2013   Snoring 07/03/2013   Diverticular disease of colon 06/26/2013   RUQ abdominal pain 01/04/2013   Nausea without vomiting 01/04/2013   Hoarseness of voice 01/04/2013   Diarrhea 01/04/2013   Broken toe 03/09/2012   Dyspnea 11/05/2011   Upper airway cough syndrome 11/05/2011   Asthma,  persistent controlled 11/05/2011   Morbid obesity (HCC) 11/05/2011   Essential hypertension 09/09/2011   Hyperlipemia 09/09/2011   Bursitis, trochanteric 08/09/2011   Ankle arthropathy 05/31/2011    PCP: Elsie Gentry, MD REFERRING PROVIDER: Elsie Gentry, MD  REFERRING DIAG: H81.10 (ICD-10-CM) - Benign paroxysmal vertigo, unspecified ear   THERAPY DIAG:  Unsteadiness on feet - Plan: PT plan of care cert/re-cert  Dizziness and giddiness - Plan: PT plan of care cert/re-cert  ONSET DATE: 11/01/23 referral   Rationale for Evaluation and Treatment: Rehabilitation  SUBJECTIVE:   SUBJECTIVE STATEMENT: Patient arrives to clinic with husband. Per patient, she has a poor memory, so Husband supplements history. She cannot remember when her dizziness. Began. She has sustained  multiple falls, but none in the last 6 months. She endorses frequent stumbling, esp with shoes on. States that she would be most confident walking without shoes on. Denies neuropathy, but does have a history of multiple joint replacements. Patient denies dizziness when sitting in clinic, but states when she was walking back to mat table she was just scared. States that if she were not holding onto her husband, then she would be dizzy. Patient wears prism  glasses. Prior to that, when fatigued, her vision will double. Now reports that with her glasses on, at times will still double at times.  Pt accompanied by: self  PERTINENT HISTORY: anemia, anxiety, asthma, depression, GERD, HTN, HLD, OSA, swallowing difficulty, tachycardia, B hip/knee/ankle replacement  PAIN:  Are you having pain? No  PRECAUTIONS: Fall   WEIGHT BEARING RESTRICTIONS: No  FALLS: Has patient fallen in last 6 months? No  LIVING ENVIRONMENT: Lives with: lives with their family Lives in: House/apartment Stairs: Yes: External: 2 steps; on right going up Has following equipment at home: Single point cane, Walker - 2 wheeled, Environmental consultant - 4 wheeled,  Wheelchair (manual), Marine scientist  PLOF: Independent  PATIENT GOALS: balance  OBJECTIVE:  Note: Objective measures were completed at Evaluation unless otherwise noted.  DIAGNOSTIC FINDINGS: 11/28/22 brain MRI  IMPRESSION: 1. No evidence of an acute intracranial abnormality. 2. Chronic small vessel ischemic changes which are mild in the cerebral white matter, and moderate in the pons, progressed from the prior brain MRI of 10/13/2014. 3. Mild generalized cerebral atrophy.    COGNITION: Overall cognitive status: History of cognitive impairments - at baseline   SENSATION: WFL  Cervical ROM:   Generally limited, but pain free  STRENGTH: WFL  BED MOBILITY:  Reports independent and intermittent dizziness when first laying supine  GAIT: Gait pattern: step through pattern, decreased arm swing- Right, decreased arm swing- Left, decreased stride length, decreased hip/knee flexion- Right, Right foot flat, shuffling, trunk flexed, narrow BOS, and poor foot clearance- Right Distance walked: clinic Assistive device utilized: holding her husbands arm  Level of assistance: CGA Comments: slow gait speed    VESTIBULAR ASSESSMENT:  GENERAL OBSERVATION: NAD, no AD   SYMPTOM BEHAVIOR:  Subjective history: see above  Non-Vestibular symptoms: diplopia  Type of dizziness: scary  Frequency: multiple times a day   Duration: <1 min   Aggravating factors: Induced by position change: lying supine and sit to stand, Worse with fatigue, and Occurs when standing still   Relieving factors: no known relieving factors  Progression of symptoms: worse  OCULOMOTOR EXAM:  Ocular Alignment: normal  Ocular ROM: No Limitations  Spontaneous Nystagmus: absent  Gaze-Induced Nystagmus: absent  Smooth Pursuits: saccades  Saccades: extra eye movements  Convergence/Divergence: ~12 cm completed with prism  glasses on  VESTIBULAR - OCULAR REFLEX:   Slow VOR: Positive Bilaterally and Comment:  scary  VOR Cancellation: Normal  Head-Impulse Test: HIT Right: slow HIT Left: slow   POSITIONAL TESTING: To be assessed  MOTION SENSITIVITY:  Motion Sensitivity Quotient Intensity: 0 = none, 1 = Lightheaded, 2 = Mild, 3 = Moderate, 4 = Severe, 5 = Vomiting  Intensity  1. Sitting to supine   2. Supine to L side   3. Supine to R side   4. Supine to sitting   5. L Hallpike-Dix   6. Up from L    7. R Hallpike-Dix   8. Up from R    9. Sitting, head tipped to L knee 0  10. Head up from  L knee 1  11. Sitting, head tipped to R knee 0  12. Head up from R knee 1  13. Sitting head turns x5 1  14.Sitting head nods x5 0  15. In stance, 180 turn to L    16. In stance, 180 turn to R                                                                                                                               TREATMENT Self care/home management: -ddx, various causes of dizziness, use of rollator/ RW  -impact of anxiety/fear on balance/dizziness  PATIENT EDUCATION: Education details: PT POC, exam findings, see above Person educated: Patient and Spouse Education method: Medical illustrator Education comprehension: verbalized understanding and needs further education  HOME EXERCISE PROGRAM: To be provided  GOALS: Goals reviewed with patient? Yes  SHORT TERM GOALS: = LTG based on PT POC length   LONG TERM GOALS: Target date: 12/16/23  Pt will be independent with final HEP for improved symptoms  Baseline: to be provided Goal status: INITIAL  2.  ABC goal  Baseline: to be assessed Goal status: INITIAL  3.  BPPV goal  Baseline: to be assessed  Goal status: INITIAL  4.  MCTSIB goal  Baseline: to be assessed Goal status: INITIAL   ASSESSMENT:  CLINICAL IMPRESSION: Patient is a 78 y.o. female who was seen today for physical therapy evaluation and treatment for dizziness and imbalance. History largely provided by husband due to memory deficits. Unable to state  when this started, but has previously been treated at this clinic for dizziness/balance. She reports significant fear of falling and describes her dizziness as more scary than spinning/imbalance. She was unable to elaborate. There is the potential that her anxiety over falling is contributing to her unsteadiness and manifesting as dizziness. She consistently presented with athetoid tongue movements throughout the evaluation. She has a history of multiple joint replacements in R LE> L LE likely also contributing to her poor balance. Patient tangential at times limiting completion of multiple aspects of eval. She would benefit from skilled PT services to address the above mentioned deficits.    OBJECTIVE IMPAIRMENTS: decreased balance, decreased cognition, decreased knowledge of condition, and dizziness.   ACTIVITY LIMITATIONS: bending, bed mobility, locomotion level, and caring for others  PARTICIPATION LIMITATIONS: meal prep, cleaning, interpersonal relationship, driving, shopping, and community activity  PERSONAL FACTORS: Age, Behavior pattern, Past/current experiences, Sex, Time since onset of injury/illness/exacerbation, Transportation, and 3+ comorbidities: see above are also affecting patient's functional outcome.   REHAB POTENTIAL: Fair unknown etiology   CLINICAL DECISION MAKING: Evolving/moderate complexity  EVALUATION COMPLEXITY: Moderate   PLAN:  PT FREQUENCY: 1-2x/week  PT DURATION: 4 weeks  PLANNED INTERVENTIONS: 97164- PT Re-evaluation, 97750- Physical Performance Testing, 97110-Therapeutic exercises, 97530- Therapeutic activity, [id number]- Neuromuscular re-education, 97535- Self Care, [id number]- Manual therapy, [id number]- Gait training, [id number]- Orthotic Initial, [id number]- Orthotic/Prosthetic subsequent, [id number]- Canalith repositioning, [id number]- Aquatic Therapy, Patient/Family education, Balance  training, Stair training, Vestibular training, Visual/preceptual remediation/compensation, and DME  instructions  PLAN FOR NEXT SESSION: ABC, MCTSIB, BPPV testing + goals, standing balance   Delon DELENA Pop, PT Delon DELENA Pop, PT, DPT, CBIS  11/18/2023, 2:59 PM

## 2023-11-19 ENCOUNTER — Other Ambulatory Visit: Payer: Self-pay | Admitting: Nurse Practitioner

## 2023-11-21 ENCOUNTER — Other Ambulatory Visit: Payer: Self-pay | Admitting: Physician Assistant

## 2023-11-29 ENCOUNTER — Encounter: Payer: Self-pay | Admitting: Physical Therapy

## 2023-11-29 ENCOUNTER — Ambulatory Visit: Attending: Internal Medicine | Admitting: Physical Therapy

## 2023-11-29 VITALS — BP 83/56 | HR 91

## 2023-11-29 DIAGNOSIS — R42 Dizziness and giddiness: Secondary | ICD-10-CM | POA: Diagnosis not present

## 2023-11-29 DIAGNOSIS — R2681 Unsteadiness on feet: Secondary | ICD-10-CM | POA: Insufficient documentation

## 2023-11-29 NOTE — Therapy (Signed)
 OUTPATIENT PHYSICAL THERAPY VESTIBULAR EVALUATION     Patient Name: [name] MRN: [id number] DOB:[birth year], 78 y.o., female Today's Date: 11/29/2023  END OF SESSION:  PT End of Session - 11/29/23 1641     Visit Number 2    Number of Visits 9    Date for PT Re-Evaluation 12/16/23    Authorization Type Medicare/ [employer]    PT Start Time 1533    PT Stop Time 1629    PT Time Calculation (min) 56 min    Activity Tolerance Patient tolerated treatment well    Behavior During Therapy Anxious;Lability           Past Medical History:  Diagnosis Date   Allergy    Anemia    Antral polyp    benign   Anxiety    Arthritis    Asthma    B12 deficiency    Back pain    Chest pain    Constipation    Depression    Diverticulosis    Dizzinesses    Gallbladder problem    GERD (gastroesophageal reflux disease) 09/09/2011   History of transfusion    HTN (hypertension) 09/09/2011   Hyperlipemia 09/09/2011   Hypertension    IBS (irritable bowel syndrome)    Internal hemorrhoid    Joint pain    Obesity    Osteoarthritis    Paroxysmal atrial fibrillation (HCC)    Pneumonia    PONV (postoperative nausea and vomiting)    with big surgeries, none with endos etc, with colonoscopy- 5-6 years ago could not swallow    Shortness of breath    Sleep apnea    borderline per patient no cpap    SOB (shortness of breath) on exertion    Swallowing difficulty    Tachycardia    Unstable angina (HCC) 09/09/2011   Vitamin D  deficiency    Vocal cord dysfunction 2013   swelling   Past Surgical History:  Procedure Laterality Date   ABDOMINAL HYSTERECTOMY  1988   ANKLE FUSION Right    x 2   BACK SURGERY  [phone]   3 ruptured discs, lower back   BILATERAL ANTERIOR TOTAL HIP ARTHROPLASTY Bilateral 11/12/2014   Procedure: BILATERAL ANTERIOR TOTAL HIP ARTHROPLASTY;  Surgeon: Donnice Car, MD;  Location: [facility];  Service: Orthopedics;  Laterality: Bilateral;   BRAVO PH STUDY [name]  03/06/2013   Procedure: BRAVO PH STUDY;  Surgeon: Lamar JONETTA Aho, MD;  Location: [facility];  Service: Endoscopy;  Laterality: [name];   CARDIAC CATHETERIZATION  2013   NORMAL   CHOLECYSTECTOMY     COLONOSCOPY     ELBOW SURGERY Left    removed bone chip   ESOPHAGOGASTRODUODENOSCOPY [name] 03/06/2013   Procedure: ESOPHAGOGASTRODUODENOSCOPY (EGD);  Surgeon: Lamar JONETTA Aho, MD;  Location: [facility];  Service: Endoscopy;  Laterality: [name];   FOOT OSTEOTOMY W/ PLANTAR FASCIA RELEASE Left    ILIOTIBIAL BAND RELEASE Right 1997   implantable loop recorder placement  01/23/2020   Medtronic Reveal Linq model LNQ 22 (RLB [id number]) implantable loop recorder   JOINT REPLACEMENT     LAPAROSCOPIC GASTRIC SLEEVE RESECTION WITH HIATAL HERNIA REPAIR  08/20/2014   Procedure: LAPAROSCOPIC GASTRIC SLEEVE RESECTION WITH HIATAL HERNIA  REPAIR AND  UPPER ENDOSCOPY;  Surgeon: Donnice Lunger, MD;  Location: [facility];  Service: General;;   LEFT HEART CATHETERIZATION WITH CORONARY ANGIOGRAM Bilateral 09/10/2011   Procedure: LEFT HEART CATHETERIZATION WITH CORONARY ANGIOGRAM;  Surgeon: Dorn JINNY Lesches, MD;  Location: [facility] C[facility];  Service: Cardiovascular;  Laterality: Bilateral;   NASAL SINUS SURGERY  1997   REVERSE SHOULDER ARTHROPLASTY Right 05/14/2016   Procedure: RIGHT REVERSE SHOULDER ARTHROPLASTY;  Surgeon: Marcey Her, MD;  Location: [facility];  Service: Orthopedics;  Laterality: Right;   REVERSE SHOULDER ARTHROPLASTY Left 05/16/2020   Procedure: REVERSE SHOULDER ARTHROPLASTY;  Surgeon: Her Marcey, MD;  Location: [facility];  Service: Orthopedics;  Laterality: Left;  interscalene exparel    SHOULDER SURGERY Left    tfc wrist Left 1997   thumb replacement Bilateral    one 2009 and one 2014   TOTAL ANKLE REPLACEMENT Right    x 3   TOTAL ANKLE REPLACEMENT Left    TOTAL ANKLE REPLACEMENT     TOTAL HIP ARTHROPLASTY Bilateral    TOTAL KNEE ARTHROPLASTY Bilateral    left 2002, right 2012   TOTAL SHOULDER ARTHROPLASTY  Right 05/14/2016   TRANSTHORACIC ECHOCARDIOGRAM  09/09/2011   MILD CONCENTRIC LVH. TRACE MITRAL REGURG. TRACE TR. MILD AORTIC REGURG.   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   UPPER GI ENDOSCOPY  08/20/2014   Procedure: UPPER GI ENDOSCOPY;  Surgeon: Donnice Lunger, MD;  Location: [facility];  Service: General;;   WRIST SURGERY Left 1997, 2009    Patient Active Problem List   Diagnosis Date Noted   Impacted cerumen of right ear 11/16/2023   Hypertrophy of nasal turbinates 08/31/2023   Dizziness 08/06/2023   Sensorineural hearing loss, bilateral 08/06/2023   Tinnitus of both ears 08/06/2023   Chronic rhinitis 08/06/2023   Hematochezia 12/02/2022   Diverticular hemorrhage 12/02/2022   Rectal bleeding 11/30/2022   H/O total shoulder replacement, left 05/16/2020   Reactive airway disease 11/22/2019   GI bleed 10/14/2019   Elevated serum creatinine 10/14/2019   Chronic diarrhea 01/31/2018   Irritable bowel syndrome with diarrhea 01/31/2018   Bloating 01/31/2018   Generalized abdominal pain 01/31/2018   Colon cancer screening 03/24/2017   Insulin  resistance 02/15/2017   Vitamin D  deficiency 02/15/2017   High serum vitamin B12 02/15/2017   S/P shoulder replacement, right 05/14/2016   Acute blood loss anemia 12/04/2014   Obese 11/13/2014   S/P bilateral THA, AA 11/12/2014   Confusion 10/13/2014   Acute confusional state 10/13/2014   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 08/20/2014   Aftercare following joint replacement 03/28/2014   History of artificial joint 03/28/2014   Degenerative arthritis of hip 09/27/2013   Paroxysmal atrial fibrillation (HCC) 09/07/2013   Tachycardia 07/03/2013   Snoring 07/03/2013   Diverticular disease of colon 06/26/2013   RUQ abdominal pain 01/04/2013   Nausea without vomiting 01/04/2013   Hoarseness of voice 01/04/2013   Diarrhea 01/04/2013   Broken toe 03/09/2012   Dyspnea 11/05/2011   Upper airway cough syndrome 11/05/2011   Asthma,  persistent controlled 11/05/2011   Morbid obesity (HCC) 11/05/2011   Essential hypertension 09/09/2011   Hyperlipemia 09/09/2011   Bursitis, trochanteric 08/09/2011   Ankle arthropathy 05/31/2011    PCP: Elsie Gentry, MD REFERRING PROVIDER: Elsie Gentry, MD  REFERRING DIAG: H81.10 (ICD-10-CM) - Benign paroxysmal vertigo, unspecified ear   THERAPY DIAG:  Unsteadiness on feet  Dizziness and giddiness  ONSET DATE: 11/01/23 referral   Rationale for Evaluation and Treatment: Rehabilitation  SUBJECTIVE:   SUBJECTIVE STATEMENT: Patient using rollator at today's session - says she hasn't been anywhere to really use the walker since she was instructed to do so in previous PT session - Says she gets dizzy when she stands up - usually just  lasts for few seconds and then goes away. Pt states she walks barefooted in the house and does well with no fear of falling. Hasn't walked much at all with walker since she was here last - unable to walk in her driveway as it is gravel.  Pt reporting back discomfort due to getting boxes of fall decorations out of storage shed  Pt accompanied by: self  PERTINENT HISTORY: anemia, anxiety, asthma, depression, GERD, HTN, HLD, OSA, swallowing difficulty, tachycardia, B hip/knee/ankle replacement  PAIN:  Are you having pain? Back discomfort due to getting boxes out of storage shed  PRECAUTIONS: Fall   WEIGHT BEARING RESTRICTIONS: No  FALLS: Has patient fallen in last 6 months? No  LIVING ENVIRONMENT: Lives with: lives with their family Lives in: House/apartment Stairs: Yes: External: 2 steps; on right going up Has following equipment at home: Single point cane, Walker - 2 wheeled, Environmental consultant - 4 wheeled, Wheelchair (manual), Marine scientist  PLOF: Independent  PATIENT GOALS: balance  OBJECTIVE:  Note: Objective measures were completed at Evaluation unless otherwise noted.  DIAGNOSTIC FINDINGS: 11/28/22 brain MRI  IMPRESSION: 1. No evidence of an  acute intracranial abnormality. 2. Chronic small vessel ischemic changes which are mild in the cerebral white matter, and moderate in the pons, progressed from the prior brain MRI of 10/13/2014. 3. Mild generalized cerebral atrophy.    COGNITION: Overall cognitive status: History of cognitive impairments - at baseline   SENSATION: WFL  Cervical ROM:   Generally limited, but pain free  STRENGTH: WFL  BED MOBILITY:  Reports independent and intermittent dizziness when first laying supine  GAIT: Gait pattern: step through pattern, decreased arm swing- Right, decreased arm swing- Left, decreased stride length, decreased hip/knee flexion- Right, Right foot flat, shuffling, trunk flexed, narrow BOS, and poor foot clearance- Right Distance walked: clinic Assistive device utilized: holding her husbands arm  Level of assistance: CGA Comments: slow gait speed    VESTIBULAR ASSESSMENT:  GENERAL OBSERVATION: NAD, no AD   SYMPTOM BEHAVIOR:  Subjective history: see above  Non-Vestibular symptoms: diplopia  Type of dizziness: scary  Frequency: multiple times a day   Duration: <1 min   Aggravating factors: Induced by position change: lying supine and sit to stand, Worse with fatigue, and Occurs when standing still   Relieving factors: no known relieving factors  Progression of symptoms: worse  OCULOMOTOR EXAM:  Ocular Alignment: normal  Ocular ROM: No Limitations  Spontaneous Nystagmus: absent  Gaze-Induced Nystagmus: absent  Smooth Pursuits: saccades  Saccades: extra eye movements  Convergence/Divergence: ~12 cm completed with prism  glasses on  VESTIBULAR - OCULAR REFLEX:   Slow VOR: Positive Bilaterally and Comment: scary  VOR Cancellation: Normal  Head-Impulse Test: HIT Right: slow HIT Left: slow   POSITIONAL TESTING: To be assessed  MOTION SENSITIVITY:  Motion Sensitivity Quotient Intensity: 0 = none, 1 = Lightheaded, 2 = Mild, 3 = Moderate, 4 = Severe, 5 =  Vomiting  Intensity  1. Sitting to supine   2. Supine to L side   3. Supine to R side   4. Supine to sitting   5. L Hallpike-Dix   6. Up from L    7. R Hallpike-Dix   8. Up from R    9. Sitting, head tipped to L knee 0  10. Head up from L knee 1  11. Sitting, head tipped to R knee 0  12. Head up from R knee 1  13. Sitting head turns x5 1  14.Sitting head  nods x5 0  15. In stance, 180 turn to L    16. In stance, 180 turn to R                                                                                                                               TREATMENT NeuroRe-ed: Rt sidelying - no nystagmus noted but pt stated you are jumpy  Lt sidelying - no nystagmus - I get of sorts - lasted approx. 3-4 secs  Rt Dix-hallpike test - no nystagmus; c/o double vision with return to upright Lt Dix-Hallpike test - no nystagmus, but states she saw double in test position    mCTSIB; condition 1 - 16.66 secs with EO on floor 1st rep;  2nd rep 29.63 secs                 Condition 2 - 8.06 secs 1st trial;  15.97 secs 2nd trial (increased anxiety with standing with EC)               Conditions 3 & 4 not assessed due to difficulty and anxiety with conditions 1 and 2  Issued standing on floor with EO and EC for HEP Access Code: [email or web address] - Medbridge   Self Care; Pt discussed at length her symptoms including dizziness/light-headedness and double vision; appt with Dr. Karis - states he did not find BPPV at that appt Discussed using rollator for assistance with ambulation - states she has not been anywhere to use it (in community) and is unable to use it on her gravel driveway   Pt 's symptoms consistent with orthostatic hypotension ; BP checked in seated position and then in standing position - pt symptomatic with sit to stand transfer Vitals:   11/29/23 1615 11/29/23 1618  BP: 102/68 (!) 83/56  Pulse:  91     PATIENT EDUCATION: Education details:  Medbridge - see below Person  educated: Patient and Spouse Education method: Explanation and Demonstration Education comprehension: verbalized understanding and needs further education  HOME EXERCISE PROGRAM: Access Code: [email or web address] URL: [email or web address] Date: 11/29/2023 Prepared by: Rock Kussmaul  Exercises - Standing with Eyes Open  - 1 x daily - 7 x weekly - 1 sets - 2 reps - 30 sec hold - Romberg Stance with Eyes Closed  - 1 x daily - 7 x weekly - 1 sets - 2 reps - 30 secs hold  GOALS: Goals reviewed with patient? Yes  SHORT TERM GOALS: = LTG based on PT POC length   LONG TERM GOALS: Target date: 12/16/23  Pt will be independent with final HEP for improved symptoms  Baseline: to be provided Goal status: INITIAL  2.  ABC goal  Baseline: to be assessed Goal status: INITIAL  3.  BPPV goal  Baseline: to be assessed  Goal status: Goal deferred 11-29-23 due to no signs of BPPV  4.  MCTSIB goal;  stand for 30 secs on condition 2 without LOB (  unable to assess conditions 3 and 4 due to anxiety/fear of falling with standing on floor so foam not attempted) Baseline: to be assessed Goal status: INITIAL   ASSESSMENT:  CLINICAL IMPRESSION: PT session focused on further vestibular assessment with positional testing negative for nystagmus - no c/o spinning vertigo reported in Rt or Lt sidelying tests and no nystagmus noted in Rt or Lt Dix-Hallpike test, even though pt reported having double vision in Lt Dix-Hallpike test position and reported significant light headedness with return to upright from Rt Dix-Hallpike test. Pt noted to have orthostatic hypotension with BP dropping from 102/68 to 83/56 (seated to standing positions).  Anxiety appears to contribute to pt's dizziness; pt unable to stand with feet together on floor > 16 secs on 2nd rep, 1st rep 8.06 secs with pt reporting feeling like she was going to fall.  Dizziness appears to be multi-factorial in etiology.  Cont with POC.    OBJECTIVE  IMPAIRMENTS: decreased balance, decreased cognition, decreased knowledge of condition, and dizziness.   ACTIVITY LIMITATIONS: bending, bed mobility, locomotion level, and caring for others  PARTICIPATION LIMITATIONS: meal prep, cleaning, interpersonal relationship, driving, shopping, and community activity  PERSONAL FACTORS: Age, Behavior pattern, Past/current experiences, Sex, Time since onset of injury/illness/exacerbation, Transportation, and 3+ comorbidities: see above are also affecting patient's functional outcome.   REHAB POTENTIAL: Fair unknown etiology   CLINICAL DECISION MAKING: Evolving/moderate complexity  EVALUATION COMPLEXITY: Moderate   PLAN:  PT FREQUENCY: 1-2x/week  PT DURATION: 4 weeks  PLANNED INTERVENTIONS: [id number]- PT Re-evaluation, 97750- Physical Performance Testing, 97110-Therapeutic exercises, 97530- Therapeutic activity, [id number]- Neuromuscular re-education, 97535- Self Care, [id number]- Manual therapy, [phone]- Gait training, [phone]- Orthotic Initial, [phone]- Orthotic/Prosthetic subsequent, [phone]- Canalith repositioning, [phone]- Aquatic Therapy, Patient/Family education, Balance training, Stair training, Vestibular training, Visual/preceptual remediation/compensation, and DME instructions  PLAN FOR NEXT SESSION: do ABC (did not get to this on 11-29-23); may want to do entire orthostatic assessment; check balance exercises issued for HEP and add as appropriate; begin standing balance exercises as tolerated based on anxiety    Billyjoe Go, [place], PT   11/29/2023, 4:46 PM

## 2023-12-02 ENCOUNTER — Ambulatory Visit

## 2023-12-02 VITALS — BP 117/71 | HR 91

## 2023-12-02 DIAGNOSIS — R2681 Unsteadiness on feet: Secondary | ICD-10-CM

## 2023-12-02 DIAGNOSIS — R42 Dizziness and giddiness: Secondary | ICD-10-CM

## 2023-12-02 NOTE — Therapy (Signed)
 OUTPATIENT PHYSICAL THERAPY VESTIBULAR EVALUATION     Patient Name: [name] MRN: [id number] DOB:31-Dec-1945, 78 y.o., female Today's Date: 12/02/2023  END OF SESSION:  PT End of Session - 12/02/23 1533     Visit Number 3    Number of Visits 9    Date for PT Re-Evaluation 12/16/23    Authorization Type Medicare/ [employer]    PT Start Time 1531    PT Stop Time 1555   going on hold   PT Time Calculation (min) 24 min    Activity Tolerance Patient tolerated treatment well    Behavior During Therapy WFL for tasks assessed/performed           Past Medical History:  Diagnosis Date   Allergy    Anemia    Antral polyp    benign   Anxiety    Arthritis    Asthma    B12 deficiency    Back pain    Chest pain    Constipation    Depression    Diverticulosis    Dizzinesses    Gallbladder problem    GERD (gastroesophageal reflux disease) 09/09/2011   History of transfusion    HTN (hypertension) 09/09/2011   Hyperlipemia 09/09/2011   Hypertension    IBS (irritable bowel syndrome)    Internal hemorrhoid    Joint pain    Obesity    Osteoarthritis    Paroxysmal atrial fibrillation (HCC)    Pneumonia    PONV (postoperative nausea and vomiting)    with big surgeries, none with endos etc, with colonoscopy- 5-6 years ago could not swallow    Shortness of breath    Sleep apnea    borderline per patient no cpap    SOB (shortness of breath) on exertion    Swallowing difficulty    Tachycardia    Unstable angina (HCC) 09/09/2011   Vitamin D  deficiency    Vocal cord dysfunction 2013   swelling   Past Surgical History:  Procedure Laterality Date   ABDOMINAL HYSTERECTOMY  1988   ANKLE FUSION Right    x 2   BACK SURGERY  [phone]   3 ruptured discs, lower back   BILATERAL ANTERIOR TOTAL HIP ARTHROPLASTY Bilateral 11/12/2014   Procedure: BILATERAL ANTERIOR TOTAL HIP ARTHROPLASTY;  Surgeon: Donnice Car, MD;  Location: [facility];  Service: Orthopedics;  Laterality:  Bilateral;   BRAVO PH STUDY [name] 03/06/2013   Procedure: BRAVO PH STUDY;  Surgeon: Lamar JONETTA Aho, MD;  Location: [facility];  Service: Endoscopy;  Laterality: [name];   CARDIAC CATHETERIZATION  2013   NORMAL   CHOLECYSTECTOMY     COLONOSCOPY     ELBOW SURGERY Left    removed bone chip   ESOPHAGOGASTRODUODENOSCOPY [name] 03/06/2013   Procedure: ESOPHAGOGASTRODUODENOSCOPY (EGD);  Surgeon: Lamar JONETTA Aho, MD;  Location: [facility];  Service: Endoscopy;  Laterality: [name];   FOOT OSTEOTOMY W/ PLANTAR FASCIA RELEASE Left    ILIOTIBIAL BAND RELEASE Right 1997   implantable loop recorder placement  01/23/2020   Medtronic Reveal Linq model LNQ 22 (RLB [id number]) implantable loop recorder   JOINT REPLACEMENT     LAPAROSCOPIC GASTRIC SLEEVE RESECTION WITH HIATAL HERNIA REPAIR  08/20/2014   Procedure: LAPAROSCOPIC GASTRIC SLEEVE RESECTION WITH HIATAL HERNIA  REPAIR AND  UPPER ENDOSCOPY;  Surgeon: Donnice Lunger, MD;  Location: [facility];  Service: General;;   LEFT HEART CATHETERIZATION WITH CORONARY ANGIOGRAM Bilateral 09/10/2011   Procedure: LEFT HEART CATHETERIZATION WITH CORONARY ANGIOGRAM;  Surgeon: Dorn JINNY Lesches,  MD;  Location: [facility];  Service: Cardiovascular;  Laterality: Bilateral;   NASAL SINUS SURGERY  1997   REVERSE SHOULDER ARTHROPLASTY Right 05/14/2016   Procedure: RIGHT REVERSE SHOULDER ARTHROPLASTY;  Surgeon: Marcey Her, MD;  Location: [facility];  Service: Orthopedics;  Laterality: Right;   REVERSE SHOULDER ARTHROPLASTY Left 05/16/2020   Procedure: REVERSE SHOULDER ARTHROPLASTY;  Surgeon: Her Marcey, MD;  Location: [facility];  Service: Orthopedics;  Laterality: Left;  interscalene exparel    SHOULDER SURGERY Left    tfc wrist Left 1997   thumb replacement Bilateral    one 2009 and one 2014   TOTAL ANKLE REPLACEMENT Right    x 3   TOTAL ANKLE REPLACEMENT Left    TOTAL ANKLE REPLACEMENT     TOTAL HIP ARTHROPLASTY Bilateral    TOTAL KNEE ARTHROPLASTY Bilateral    left 2002, right 2012    TOTAL SHOULDER ARTHROPLASTY Right 05/14/2016   TRANSTHORACIC ECHOCARDIOGRAM  09/09/2011   MILD CONCENTRIC LVH. TRACE MITRAL REGURG. TRACE TR. MILD AORTIC REGURG.   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   UPPER GI ENDOSCOPY  08/20/2014   Procedure: UPPER GI ENDOSCOPY;  Surgeon: Donnice Lunger, MD;  Location: [facility];  Service: General;;   WRIST SURGERY Left 1997, 2009    Patient Active Problem List   Diagnosis Date Noted   Impacted cerumen of right ear 11/16/2023   Hypertrophy of nasal turbinates 08/31/2023   Dizziness 08/06/2023   Sensorineural hearing loss, bilateral 08/06/2023   Tinnitus of both ears 08/06/2023   Chronic rhinitis 08/06/2023   Hematochezia 12/02/2022   Diverticular hemorrhage 12/02/2022   Rectal bleeding 11/30/2022   H/O total shoulder replacement, left 05/16/2020   Reactive airway disease 11/22/2019   GI bleed 10/14/2019   Elevated serum creatinine 10/14/2019   Chronic diarrhea 01/31/2018   Irritable bowel syndrome with diarrhea 01/31/2018   Bloating 01/31/2018   Generalized abdominal pain 01/31/2018   Colon cancer screening 03/24/2017   Insulin  resistance 02/15/2017   Vitamin D  deficiency 02/15/2017   High serum vitamin B12 02/15/2017   S/P shoulder replacement, right 05/14/2016   Acute blood loss anemia 12/04/2014   Obese 11/13/2014   S/P bilateral THA, AA 11/12/2014   Confusion 10/13/2014   Acute confusional state 10/13/2014   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 08/20/2014   Aftercare following joint replacement 03/28/2014   History of artificial joint 03/28/2014   Degenerative arthritis of hip 09/27/2013   Paroxysmal atrial fibrillation (HCC) 09/07/2013   Tachycardia 07/03/2013   Snoring 07/03/2013   Diverticular disease of colon 06/26/2013   RUQ abdominal pain 01/04/2013   Nausea without vomiting 01/04/2013   Hoarseness of voice 01/04/2013   Diarrhea 01/04/2013   Broken toe 03/09/2012   Dyspnea 11/05/2011   Upper airway cough  syndrome 11/05/2011   Asthma, persistent controlled 11/05/2011   Morbid obesity (HCC) 11/05/2011   Essential hypertension 09/09/2011   Hyperlipemia 09/09/2011   Bursitis, trochanteric 08/09/2011   Ankle arthropathy 05/31/2011    PCP: Elsie Gentry, MD REFERRING PROVIDER: Elsie Gentry, MD  REFERRING DIAG: H81.10 (ICD-10-CM) - Benign paroxysmal vertigo, unspecified ear   THERAPY DIAG:  Unsteadiness on feet  Dizziness and giddiness  ONSET DATE: 11/01/23 referral   Rationale for Evaluation and Treatment: Rehabilitation  SUBJECTIVE:   SUBJECTIVE STATEMENT: Patient arrives to clinic with husband- using rollator. Reports PCP took her off BP rx and since then she has been feeling much better and has not had any dizziness. Denies falls.  Pt accompanied by: self  PERTINENT HISTORY: anemia, anxiety, asthma, depression, GERD, HTN, HLD, OSA, swallowing difficulty, tachycardia, B hip/knee/ankle replacement  PAIN:  Are you having pain? Back discomfort due to getting boxes out of storage shed  PRECAUTIONS: Fall  PATIENT GOALS: balance  OBJECTIVE:  Note: Objective measures were completed at Evaluation unless otherwise noted.  DIAGNOSTIC FINDINGS: 11/28/22 brain MRI  IMPRESSION: 1. No evidence of an acute intracranial abnormality. 2. Chronic small vessel ischemic changes which are mild in the cerebral white matter, and moderate in the pons, progressed from the prior brain MRI of 10/13/2014. 3. Mild generalized cerebral atrophy.                                                                                                                             TREATMENT Self care/home management:  Vitals:   12/02/23 1540 12/02/23 1541  BP: 124/74 117/71  Pulse: 85 91    -extensive discussion on impact of OH on dizziness and mobility -encouraged use of rollator in community  -slow movements to minimize OH instances   PATIENT EDUCATION: Education details:  PT POC, exam findings Person  educated: Patient and Spouse Education method: Medical illustrator Education comprehension: verbalized understanding and needs further education  HOME EXERCISE PROGRAM: Access Code: [email or web address] URL: [email or web address] Date: 11/29/2023 Prepared by: Rock Kussmaul  Exercises - Standing with Eyes Open  - 1 x daily - 7 x weekly - 1 sets - 2 reps - 30 sec hold - Romberg Stance with Eyes Closed  - 1 x daily - 7 x weekly - 1 sets - 2 reps - 30 secs hold  GOALS: Goals reviewed with patient? Yes  SHORT TERM GOALS: = LTG based on PT POC length   LONG TERM GOALS: Target date: 12/16/23  Pt will be independent with final HEP for improved symptoms  Baseline: to be provided Goal status: INITIAL  2.  ABC goal  Baseline: to be assessed Goal status: INITIAL  3.  BPPV goal  Baseline: to be assessed  Goal status: Goal deferred 11-29-23 due to no signs of BPPV  4.  MCTSIB goal;  stand for 30 secs on condition 2 without LOB (unable to assess conditions 3 and 4 due to anxiety/fear of falling with standing on floor so foam not attempted) Baseline: to be assessed Goal status: INITIAL   ASSESSMENT:  CLINICAL IMPRESSION: Patient seen for skilled PT session with emphasis on patient education. She reports that her BP rx has been stopped. Since then she has not experienced any instances of dizziness and overall feels much better. This more than likely confirms the assumption of orthostatic hypotension as driving cause for her dizziness. Patient and husband agreeable to go on hold with PT to assess for continued success and return PRN should dizziness return despite removal of BP rx. Continue POC PRN.   OBJECTIVE IMPAIRMENTS: decreased balance, decreased cognition, decreased knowledge of condition, and dizziness.   ACTIVITY LIMITATIONS: bending, bed  mobility, locomotion level, and caring for others  PARTICIPATION LIMITATIONS: meal prep, cleaning, interpersonal relationship,  driving, shopping, and community activity  PERSONAL FACTORS: Age, Behavior pattern, Past/current experiences, Sex, Time since onset of injury/illness/exacerbation, Transportation, and 3+ comorbidities: see above are also affecting patient's functional outcome.   REHAB POTENTIAL: Fair unknown etiology   CLINICAL DECISION MAKING: Evolving/moderate complexity  EVALUATION COMPLEXITY: Moderate   PLAN:  PT FREQUENCY: 1-2x/week  PT DURATION: 4 weeks  PLANNED INTERVENTIONS: 97164- PT Re-evaluation, 97750- Physical Performance Testing, 97110-Therapeutic exercises, 97530- Therapeutic activity, [id number]- Neuromuscular re-education, 97535- Self Care, [id number]- Manual therapy, [phone]- Gait training, [phone]- Orthotic Initial, [phone]- Orthotic/Prosthetic subsequent, [phone]- Canalith repositioning, [phone]- Aquatic Therapy, Patient/Family education, Balance training, Stair training, Vestibular training, Visual/preceptual remediation/compensation, and DME instructions  PLAN FOR NEXT SESSION: do ABC (did not get to this on 11-29-23); may want to do entire orthostatic assessment; check balance exercises issued for HEP and add as appropriate; begin standing balance exercises as tolerated based on anxiety    Delon DELENA Pop, PT Delon DELENA Pop, PT, DPT, CBIS   12/02/2023, 4:01 PM

## 2023-12-05 ENCOUNTER — Ambulatory Visit (INDEPENDENT_AMBULATORY_CARE_PROVIDER_SITE_OTHER)

## 2023-12-05 DIAGNOSIS — I480 Paroxysmal atrial fibrillation: Secondary | ICD-10-CM | POA: Diagnosis not present

## 2023-12-06 LAB — CUP PACEART REMOTE DEVICE CHECK
Date Time Interrogation Session: 20250913230409
Implantable Pulse Generator Implant Date: 20211103

## 2023-12-07 ENCOUNTER — Ambulatory Visit

## 2023-12-09 ENCOUNTER — Ambulatory Visit

## 2023-12-10 ENCOUNTER — Ambulatory Visit: Payer: Self-pay | Admitting: Cardiovascular Disease

## 2023-12-12 NOTE — Progress Notes (Signed)
 Remote Loop Recorder Transmission

## 2023-12-14 ENCOUNTER — Encounter

## 2023-12-14 ENCOUNTER — Other Ambulatory Visit: Payer: Self-pay | Admitting: Physician Assistant

## 2023-12-14 NOTE — Progress Notes (Signed)
 Remote Loop Recorder Transmission

## 2023-12-16 ENCOUNTER — Encounter

## 2023-12-19 ENCOUNTER — Encounter

## 2023-12-29 NOTE — Progress Notes (Signed)
 Remote Loop Recorder Transmission

## 2023-12-30 ENCOUNTER — Encounter: Payer: Self-pay | Admitting: Physician Assistant

## 2023-12-30 ENCOUNTER — Ambulatory Visit (INDEPENDENT_AMBULATORY_CARE_PROVIDER_SITE_OTHER): Admitting: Physician Assistant

## 2023-12-30 VITALS — BP 110/70 | HR 74 | Resp 20 | Ht 62.0 in | Wt 213.0 lb

## 2023-12-30 DIAGNOSIS — G3184 Mild cognitive impairment, so stated: Secondary | ICD-10-CM | POA: Diagnosis not present

## 2023-12-30 MED ORDER — MEMANTINE HCL 10 MG PO TABS: 10.0000 mg | ORAL_TABLET | Freq: Two times a day (BID) | ORAL | 3 refills | Status: AC

## 2023-12-30 NOTE — Progress Notes (Signed)
 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 LH female with a history of hypertension, hyperlipidemia, anxiety, depression in remission, diverticulitis and a diagnosis of advanced mild cognitive impairment per neuropsych evaluation 2025 seen today in follow up for memory loss. Patient is currently on memantine  10 mg twice daily, tolerating well. She is able to participate on ADLs.  Memory stable MMSE 27/30.     Follow up in  6 months. Continue memantine  10 mg twice daily, side effects discussed Repeat neuropsych evaluation March 2026 for diagnostic clarity and disease trajectory Recommend good control of her cardiovascular risk factors Continue to control mood as per PCP, consider psychotherapy for anxiety and depression Recommend checking hearing in an effort to improve comprehension, recommend using her hearing aids Recommend increasing level of physical activity      Subjective:    This patient is accompanied in the office by her husband who supplements the history.  Previous records as well as any outside records available were reviewed prior to todays visit. Patient was last seen on 06/30/2023   Any changes in memory since last visit? I am more upset I may forget more-she says. She is concerned about memory, and cannot tell if her memory is worse. Her husband reports that her memory is better. I am stuck in the house a lot. I need to get out of the house. repeats oneself?  Endorsed, especially when not using the hearing aid. Disoriented when walking into a room? Denies    Leaving objects?  May misplace things but not in unusual places   Wandering behavior?  denies   Any personality changes since last visit? I am more frustrated at times. I take a chill pill and may need to tweek it Any worsening depression?:  I am very sensitive about this memory issue. Hallucinations or paranoia?  Denies.   Seizures?  denies    Any sleep changes? I Am an avid reader and can't keep up with it, but I am upset I am unable to read T sleep at night Denies vivid dreams, REM behavior or sleepwalking   Sleep apnea?   Denies.   Any hygiene concerns? Denies.  Independent of bathing and dressing?  Endorsed  Does the patient needs help with medications?  Husband is in charge   Who is in charge of the finances?  Husband is in charge of the big ones.      Any changes in appetite?  Denies, does not drink enough water      Patient have trouble swallowing? Denies.   Does the patient cook? Yes, sometimes takes me longer, one time 4 hrs, because they were in my head and now I relay on recipes.  Any headaches?   denies   Any vision changes? Denies  Chronic back pain  denies.   Ambulates with difficulty? Denies. Recently had PT Recent falls or head injuries? Denies .     Unilateral weakness, numbness or tingling? denies   Any tremors?  Denies   Any anosmia?  Denies   Any incontinence of urine?  Endorsed   Any bowel dysfunction?  After 2016 she had the gastric sleeve,diverticulosis , chronic diarrhea      Patient lives with her husband   Does the patient drive? No longer drives       Initial visit 11/12/2022 How long did patient have memory difficulties? For the last year.  Sometimes she forgets the times  of her appointments or names  repeats oneself?  Endorsed Disoriented when walking into a room?  Patient denies   Leaving objects in unusual places? denies   Wandering behavior?  denies   Any personality changes?  Patient denies   Any history of depression?:  Patient denies any recent depression, it is on remission  Hallucinations or paranoia?  Patient denies   Seizures?   Patient denies    Any sleep changes?   Sleeps well  most of the time Occasional vivid dreams, denies REM behavior or sleepwalking   Sleep apnea?  Patient denies   Any hygiene concerns?  Patient denies   Independent of bathing and dressing?   Endorsed  Does the patient needs help with medications? Husband is in charge   Who is in charge of the finances? We have separate accounts but husband is in charge     Any changes in appetite?  Denies      Patient have trouble swallowing? Denies    Does the patient cook?  Yes sometimes she forgets ingredients, which frustrates her-husband says.   Any kitchen accidents such as leaving the stove on? denies   Any headaches?   Denies    Chronic pain ?  arthritis     Ambulates with difficulty?  Started balance therapy and likes like it   Recent falls or head injuries? Denies    Vision changes? denies   Unilateral weakness, numbness or tingling? Denies    Any tremors?   Denies    Any anosmia?  Denies    Any incontinence of urine?  Endorsed. Any bowel dysfunction? Yes it goes back and forth, mostly diarrhea. She has a history of IBS  Patient lives with her husband History of heavy alcohol  intake? Denies.   History of heavy tobacco use? Denies.   Family history of dementia? I don't really know.   Does patient drive?  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      Neuropsych evaluation March 2025 Dr. Gayland Briefly, results indicated cognitive deficits in multiple domains, with the most prominent impairments being in memory and visuospatial skills. While overall functioning remains largely preserved, there are concerns about potential decline. Considering her cognitive and functional status, findings are at the threshold between a diagnosis of advanced mild cognitive impairment and mild dementia. 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 The overall clinical picture, including the insidious onset of symptoms, difficulties with orientation, amnestic memory profile, and visuospatial impairments, raises concern  for a underlying Alzheimer's disease process. Other possible etiological contributors include hearing loss, anxiety, chronic cerebrovascular disease noted on MRI, and the possibility of untreated sleep apnea.    MRI of the brain 11/28/2022, personally reviewed without acute findings, remarkable for chronic small vessel ischemic changes, mild in the cerebral white matter and moderate in the pons, mild generalized cerebral atrophy PREVIOUS MEDICATIONS:   CURRENT MEDICATIONS:  Outpatient Encounter Medications as of 12/30/2023  Medication Sig   acetaminophen  (TYLENOL ) 500 MG tablet Take 1,000 mg by mouth every 6 (six) hours as needed (for pain.).    albuterol  (PROVENTIL  HFA;VENTOLIN  HFA) 108 (90 Base) MCG/ACT inhaler Inhale 2 puffs into the lungs every 6 (six) hours as needed for shortness of breath or wheezing.   ALPRAZolam  (XANAX ) 0.25 MG tablet Take 0.25 mg by mouth daily as needed for  anxiety.   colestipol  (COLESTID ) 1 g tablet TAKE 1 TABLET BY MOUTH 2 TIMES DAILY.   cyclobenzaprine (FLEXERIL) 10 MG tablet Take 10 mg by mouth 3 (three) times daily as needed for muscle spasms.   dicyclomine  (BENTYL ) 20 MG tablet TAKE 1 TABLET (20 MG TOTAL) BY MOUTH 3 (THREE) TIMES DAILY AS NEEDED FOR SPASMS.   diltiazem  (CARDIZEM  CD) 240 MG 24 hr capsule TAKE 1 CAPSULE BY MOUTH EVERY DAY   losartan  (COZAAR ) 50 MG tablet Take 50 mg by mouth daily. (Patient taking differently: Take 50 mg by mouth daily. Taken 25mg  daily)   pantoprazole  (PROTONIX ) 40 MG tablet TAKE 1 TABLET (40 MG TOTAL) BY MOUTH TWICE A DAY BEFORE MEALS   simvastatin  (ZOCOR ) 20 MG tablet Take 1 tablet (20 mg total) by mouth daily.   sucralfate  (CARAFATE ) 1 g tablet TAKE 1 TABLET (1 G TOTAL) BY MOUTH 4 TIMES A DAY WITH MEALS AND AT BEDTIME   [DISCONTINUED] memantine  (NAMENDA ) 10 MG tablet TAKE 1 TABLET BY MOUTH TWICE A DAY   ciprofloxacin  (CIPRO ) 500 MG tablet Take 1 tablet (500 mg total) by mouth 2 (two) times daily. (Patient not taking: Reported on  12/30/2023)   DULoxetine  (CYMBALTA ) 30 MG capsule 1 capsule Orally Once a day at supper X 7 days for 7 days (Patient not taking: Reported on 11/18/2023)   famotidine (PEPCID) 20 MG tablet Take 20 mg by mouth 2 (two) times daily. (Patient not taking: Reported on 12/30/2023)   loratadine  (CLARITIN ) 10 MG tablet Take 10 mg by mouth daily. (Patient not taking: Reported on 11/18/2023)   Melatonin 10 MG TABS Take 10 mg by mouth at bedtime. (Patient not taking: Reported on 12/30/2023)   memantine  (NAMENDA ) 10 MG tablet Take 1 tablet (10 mg total) by mouth 2 (two) times daily.   ondansetron  (ZOFRAN -ODT) 8 MG disintegrating tablet DISSOLVE 1 TABLET IN MOUTH EVERY 8 HRS AS NEEDED NAUSEA (Patient not taking: Reported on 12/30/2023)   No facility-administered encounter medications on file as of 12/30/2023.       12/30/2023    1:00 PM  MMSE - Mini Mental State Exam  Orientation to time 4  Orientation to Place 4  Registration 3  Attention/ Calculation 5  Recall 2  Language- name 2 objects 2  Language- repeat 1  Language- follow 3 step command 3  Language- read & follow direction 1  Write a sentence 1  Copy design 1  Total score 27      11/12/2022    1:00 PM  Montreal Cognitive Assessment   Visuospatial/ Executive (0/5) 1  Naming (0/3) 3  Attention: Read list of digits (0/2) 2  Attention: Read list of letters (0/1) 1  Attention: Serial 7 subtraction starting at 100 (0/3) 1  Language: Repeat phrase (0/2) 2  Language : Fluency (0/1) 1  Abstraction (0/2) 0  Delayed Recall (0/5) 0  Orientation (0/6) 6  Total 17  Adjusted Score (based on education) 18    Objective:     PHYSICAL EXAMINATION:    VITALS:   Vitals:   12/30/23 1255  Pulse: 74  Resp: 20  SpO2: 99%  Weight: 213 lb (96.6 kg)  Height: 5' 2 (1.575 m)    GEN:  The patient appears stated age and is in NAD. HEENT:  Normocephalic, atraumatic.   Neurological examination:  General: NAD, well-groomed, appears stated  age. Orientation: The patient is alert. Oriented to person, place and not to year Cranial nerves: There is good facial symmetry.  Anxious and tearful appearing. The speech is fluent and clear. No aphasia or dysarthria. Fund of knowledge is appropriate. Recent and remote memory are impaired. Attention and concentration are reduced. Able to name objects and repeat phrases.  Hearing is intact to conversational tone with aids.   Sensation: Sensation is intact to light touch throughout Motor: Strength is at least antigravity x4. DTR's 2/4 in UE/LE     Movement examination: Tone: There is normal tone in the UE/LE Abnormal movements:  no tremor.  No myoclonus.  No asterixis.   Coordination:  There is no decremation with RAM's. Normal finger to nose  Gait and Station: The patient has no difficulty arising out of a deep-seated chair without the use of the hands. The patient's stride length is good, slow.  Gait is cautious and narrow.    Thank you for allowing us  the opportunity to participate in the care of this nice patient. Please do not hesitate to contact us  for any questions or concerns.   Total time spent on today's visit was 41 minutes dedicated to this patient today, preparing to see patient, examining the patient, ordering tests and/or medications and counseling the patient, documenting clinical information in the EHR or other health record, independently interpreting results and communicating results to the patient/family, discussing treatment and goals, answering patient's questions and coordinating care.  Cc:  Loreli Elsie JONETTA Mickey., MD  Camie Sevin 12/30/2023 1:48 PM

## 2023-12-30 NOTE — Patient Instructions (Addendum)
 It was a pleasure to see you today at our office.   Recommendations:   Continue  Memantine  10  mg at night  Continue B12  Take care of the iron deficiency anemia  Recommend taking care of depression, anxiety.  Recommend no driving Check hearing April 20 at 11:30   For psychiatric meds, mood meds: Please have your primary care physician manage these medications.  If you have any severe symptoms of a stroke, or other severe issues such as confusion,severe chills or fever, etc call 911 or go to the ER as you may need to be evaluated further    For assessment of decision of mental capacity and competency:  Call Dr. Rosaline Nine, geriatric psychiatrist at [phone]    Whom to call: Memory  decline, memory medications: Call our office [phone]    [email or web address]   RECOMMENDATIONS FOR ALL PATIENTS WITH MEMORY PROBLEMS: 1. Continue to exercise (Recommend 30 minutes of walking everyday, or 3 hours every week) 2. Increase social interactions - continue going to [place] and enjoy social gatherings with friends and family 3. Eat healthy, avoid fried foods and eat more fruits and vegetables 4. Maintain adequate blood pressure, blood sugar, and blood cholesterol level. Reducing the risk of stroke and cardiovascular disease also helps promoting better memory. 5. Avoid stressful situations. Live a simple life and avoid aggravations. Organize your time and prepare for the next day in anticipation. 6. Sleep well, avoid any interruptions of sleep and avoid any distractions in the bedroom that may interfere with adequate sleep quality 7. Avoid sugar, avoid sweets as there is a strong link between excessive sugar intake, diabetes, and cognitive impairment We discussed the Mediterranean diet, which has been shown to help patients reduce the risk of progressive memory disorders and reduces cardiovascular risk. This includes eating fish,  eat fruits and green leafy vegetables, nuts like almonds and hazelnuts, walnuts, and also use olive oil. Avoid fast foods and fried foods as much as possible. Avoid sweets and sugar as sugar use has been linked to worsening of memory function.  There is always a concern of gradual progression of memory problems. If this is the case, then we may need to adjust level of care according to patient needs. Support, both to the patient and caregiver, should then be put into place.      You have been referred for a neuropsychological evaluation (i.e., evaluation of memory and thinking abilities). Please bring someone with you to this appointment if possible, as it is helpful for the doctor to hear from both you and another adult who knows you well. Please bring eyeglasses and hearing aids if you wear them.    The evaluation will take approximately 3 hours and has two parts:   The first part is a clinical interview with the neuropsychologist (Dr. Richie or Dr. Jackquline). During the interview, the neuropsychologist will speak with you and the individual you brought to the appointment.    The second part of the evaluation is testing with the doctor's technician Neal or Luke). During the testing, the technician will ask you to remember different types of material, solve problems, and answer some questionnaires. Your family member will not be present for this portion of the evaluation.   Please note: We must reserve several hours of the neuropsychologist's time and the psychometrician's time for your evaluation appointment. As such, there is a No-Show fee of $100. If you are unable to attend any of your appointments, please contact our office  as soon as possible to reschedule.      DRIVING: Regarding driving, in patients with progressive memory problems, driving will be impaired. We advise to have someone else do the driving if trouble finding directions or if minor accidents are reported. Independent driving  assessment is available to determine safety of driving.   If you are interested in the driving assessment, you can contact the following:  [facility] in [place] [phone]  [facility] [phone]  [facility] [phone]  [facility] [phone] or [phone]   FALL PRECAUTIONS: Be cautious when walking. Scan the area for obstacles that may increase the risk of trips and falls. When getting up in the mornings, sit up at the edge of the bed for a few minutes before getting out of bed. Consider elevating the bed at the head end to avoid drop of blood pressure when getting up. Walk always in a well-lit room (use night lights in the walls). Avoid area rugs or power cords from appliances in the middle of the walkways. Use a walker or a cane if necessary and consider physical therapy for balance exercise. Get your eyesight checked regularly.  FINANCIAL OVERSIGHT: Supervision, especially oversight when making financial decisions or transactions is also recommended.  HOME SAFETY: Consider the safety of the kitchen when operating appliances like stoves, microwave oven, and blender. Consider having supervision and share cooking responsibilities until no longer able to participate in those. Accidents with firearms and other hazards in the house should be identified and addressed as well.   ABILITY TO BE LEFT ALONE: If patient is unable to contact 911 operator, consider using [facility], or when the need is there, arrange for someone to stay with patients. Smoking is a fire hazard, consider supervision or cessation. Risk of wandering should be assessed by caregiver and if detected at any point, supervision and safe proof recommendations should be instituted.  MEDICATION SUPERVISION: Inability to self-administer medication needs to be constantly addressed. Implement a mechanism to ensure safe administration of the  medications.      Mediterranean Diet A Mediterranean diet refers to food and lifestyle choices that are based on the traditions of countries located on the [employer] [place]. This way of eating has been shown to help prevent certain conditions and improve outcomes for people who have chronic diseases, like kidney disease and heart disease. What are tips for following this plan? Lifestyle  Cook and eat meals together with your family, when possible. Drink enough fluid to keep your urine clear or pale yellow. Be physically active every day. This includes: Aerobic exercise like running or swimming. Leisure activities like gardening, walking, or housework. Get 7-8 hours of sleep each night. If recommended by your health care provider, drink red wine in moderation. This means 1 glass a day for nonpregnant women and 2 glasses a day for men. A glass of wine equals 5 oz (150 mL). Reading food labels  Check the serving size of packaged foods. For foods such as rice and pasta, the serving size refers to the amount of cooked product, not dry. Check the total fat in packaged foods. Avoid foods that have saturated fat or trans fats. Check the ingredients list for added sugars, such as corn syrup. Shopping  At the grocery store, buy most of your food from the areas near the walls of the store. This includes: Fresh fruits and vegetables (produce). Grains, beans, nuts, and seeds. Some of these may be available in unpackaged forms or large  amounts (in bulk). Fresh seafood. Poultry and eggs. Low-fat dairy products. Buy whole ingredients instead of prepackaged foods. Buy fresh fruits and vegetables in-season from local farmers markets. Buy frozen fruits and vegetables in resealable bags. If you do not have access to quality fresh seafood, buy precooked frozen shrimp or canned fish, such as tuna, salmon, or sardines. Buy small amounts of raw or cooked vegetables, salads, or olives from the deli or salad  bar at your store. Stock your pantry so you always have certain foods on hand, such as olive oil, canned tuna, canned tomatoes, rice, pasta, and beans. Cooking  Cook foods with extra-virgin olive oil instead of using butter or other vegetable oils. Have meat as a side dish, and have vegetables or grains as your main dish. This means having meat in small portions or adding small amounts of meat to foods like pasta or stew. Use beans or vegetables instead of meat in common dishes like chili or lasagna. Experiment with different cooking methods. Try roasting or broiling vegetables instead of steaming or sauteing them. Add frozen vegetables to soups, stews, pasta, or rice. Add nuts or seeds for added healthy fat at each meal. You can add these to yogurt, salads, or vegetable dishes. Marinate fish or vegetables using olive oil, lemon juice, garlic, and fresh herbs. Meal planning  Plan to eat 1 vegetarian meal one day each week. Try to work up to 2 vegetarian meals, if possible. Eat seafood 2 or more times a week. Have healthy snacks readily available, such as: Vegetable sticks with hummus. Greek yogurt. Fruit and nut trail mix. Eat balanced meals throughout the week. This includes: Fruit: 2-3 servings a day Vegetables: 4-5 servings a day Low-fat dairy: 2 servings a day Fish, poultry, or lean meat: 1 serving a day Beans and legumes: 2 or more servings a week Nuts and seeds: 1-2 servings a day Whole grains: 6-8 servings a day Extra-virgin olive oil: 3-4 servings a day Limit red meat and sweets to only a few servings a month What are my food choices? Mediterranean diet Recommended Grains: Whole-grain pasta. Brown rice. Bulgar wheat. Polenta. Couscous. Whole-wheat bread. [place] Madeira. Vegetables: Artichokes. Beets. Broccoli. Cabbage. Carrots. Eggplant. Green beans. Chard. Kale. Spinach. Onions. Leeks. Peas. Squash. Tomatoes. Peppers. Radishes. Fruits: Apples. Apricots. Avocado. Berries.  Bananas. Cherries. Dates. Figs. Grapes. Lemons. Melon. Oranges. Peaches. Plums. Pomegranate. Meats and other protein foods: Beans. Almonds. Sunflower seeds. Pine nuts. Peanuts. Cod. Salmon. Scallops. Shrimp. Tuna. Tilapia. Clams. Oysters. Eggs. Dairy: Low-fat milk. Cheese. Greek yogurt. Beverages: Water . Red wine. Herbal tea. Fats and oils: Extra virgin olive oil. Avocado oil. Grape seed oil. Sweets and desserts: [place] yogurt with honey. Baked apples. Poached pears. Trail mix. Seasoning and other foods: Basil. Cilantro. Coriander. Cumin. Mint. Parsley. Sage. Rosemary. Tarragon. Garlic. Oregano. Thyme. Pepper. Balsalmic vinegar. Tahini. Hummus. Tomato sauce. Olives. Mushrooms. Limit these Grains: Prepackaged pasta or rice dishes. Prepackaged cereal with added sugar. Vegetables: Deep fried potatoes (french fries). Fruits: Fruit canned in syrup. Meats and other protein foods: Beef. Pork. Lamb. Poultry with skin. Hot dogs. [place]. Dairy: Ice cream. Sour cream. Whole milk. Beverages: Juice. Sugar-sweetened soft drinks. Beer. Liquor and spirits. Fats and oils: Butter. Canola oil. Vegetable oil. Beef fat (tallow). Lard. Sweets and desserts: Cookies. Cakes. Pies. Candy. Seasoning and other foods: Mayonnaise. Premade sauces and marinades. The items listed may not be a complete list. Talk with your dietitian about what dietary choices are right for you. Summary The Mediterranean diet includes both food and  lifestyle choices. Eat a variety of fresh fruits and vegetables, beans, nuts, seeds, and whole grains. Limit the amount of red meat and sweets that you eat. Talk with your health care provider about whether it is safe for you to drink red wine in moderation. This means 1 glass a day for nonpregnant women and 2 glasses a day for men. A glass of wine equals 5 oz (150 mL). This information is not intended to replace advice given to you by your health care provider. Make sure you discuss any questions you  have with your health care provider. Document Released: 10/30/2015 Document Revised: 12/02/2015 Document Reviewed: 10/30/2015 Elsevier Interactive Patient Education  2017 [employer].

## 2024-01-03 ENCOUNTER — Other Ambulatory Visit: Payer: Self-pay | Admitting: Physician Assistant

## 2024-01-03 DIAGNOSIS — Z23 Encounter for immunization: Secondary | ICD-10-CM | POA: Diagnosis not present

## 2024-01-03 DIAGNOSIS — Z860101 Personal history of adenomatous and serrated colon polyps: Secondary | ICD-10-CM | POA: Diagnosis not present

## 2024-01-03 DIAGNOSIS — R7301 Impaired fasting glucose: Secondary | ICD-10-CM | POA: Diagnosis not present

## 2024-01-03 DIAGNOSIS — H8110 Benign paroxysmal vertigo, unspecified ear: Secondary | ICD-10-CM | POA: Diagnosis not present

## 2024-01-03 DIAGNOSIS — J383 Other diseases of vocal cords: Secondary | ICD-10-CM | POA: Diagnosis not present

## 2024-01-03 DIAGNOSIS — I10 Essential (primary) hypertension: Secondary | ICD-10-CM | POA: Diagnosis not present

## 2024-01-03 DIAGNOSIS — E785 Hyperlipidemia, unspecified: Secondary | ICD-10-CM | POA: Diagnosis not present

## 2024-01-03 DIAGNOSIS — I480 Paroxysmal atrial fibrillation: Secondary | ICD-10-CM | POA: Diagnosis not present

## 2024-01-03 DIAGNOSIS — Z8719 Personal history of other diseases of the digestive system: Secondary | ICD-10-CM | POA: Diagnosis not present

## 2024-01-03 DIAGNOSIS — G4700 Insomnia, unspecified: Secondary | ICD-10-CM | POA: Diagnosis not present

## 2024-01-03 DIAGNOSIS — F331 Major depressive disorder, recurrent, moderate: Secondary | ICD-10-CM | POA: Diagnosis not present

## 2024-01-03 DIAGNOSIS — G3184 Mild cognitive impairment, so stated: Secondary | ICD-10-CM | POA: Diagnosis not present

## 2024-01-05 ENCOUNTER — Encounter

## 2024-01-06 ENCOUNTER — Ambulatory Visit (INDEPENDENT_AMBULATORY_CARE_PROVIDER_SITE_OTHER)

## 2024-01-06 DIAGNOSIS — I480 Paroxysmal atrial fibrillation: Secondary | ICD-10-CM | POA: Diagnosis not present

## 2024-01-08 LAB — CUP PACEART REMOTE DEVICE CHECK
Date Time Interrogation Session: 20251016230436
Implantable Pulse Generator Implant Date: 20211103

## 2024-01-10 ENCOUNTER — Other Ambulatory Visit: Payer: Self-pay | Admitting: Gastroenterology

## 2024-01-10 NOTE — Progress Notes (Signed)
 Remote Loop Recorder Transmission

## 2024-01-16 ENCOUNTER — Ambulatory Visit: Payer: Self-pay | Admitting: Cardiovascular Disease

## 2024-01-23 ENCOUNTER — Encounter

## 2024-01-25 ENCOUNTER — Other Ambulatory Visit: Payer: Self-pay | Admitting: Physician Assistant

## 2024-01-31 ENCOUNTER — Encounter: Payer: Self-pay | Admitting: Psychology

## 2024-02-06 ENCOUNTER — Ambulatory Visit

## 2024-02-06 ENCOUNTER — Encounter

## 2024-02-06 DIAGNOSIS — I480 Paroxysmal atrial fibrillation: Secondary | ICD-10-CM

## 2024-02-06 LAB — CUP PACEART REMOTE DEVICE CHECK
Date Time Interrogation Session: 20251116231135
Implantable Pulse Generator Implant Date: 20211103

## 2024-02-08 NOTE — Progress Notes (Signed)
 Remote Loop Recorder Transmission

## 2024-02-18 ENCOUNTER — Other Ambulatory Visit: Payer: Self-pay | Admitting: Physician Assistant

## 2024-02-20 ENCOUNTER — Ambulatory Visit: Payer: Self-pay | Admitting: Cardiovascular Disease

## 2024-02-27 ENCOUNTER — Encounter

## 2024-03-08 ENCOUNTER — Ambulatory Visit

## 2024-03-08 DIAGNOSIS — I480 Paroxysmal atrial fibrillation: Secondary | ICD-10-CM

## 2024-03-08 LAB — CUP PACEART REMOTE DEVICE CHECK
Date Time Interrogation Session: 20251217230353
Implantable Pulse Generator Implant Date: 20211103

## 2024-03-09 NOTE — Progress Notes (Signed)
 Remote Loop Recorder Transmission

## 2024-03-19 ENCOUNTER — Other Ambulatory Visit: Payer: Self-pay | Admitting: Physician Assistant

## 2024-03-21 ENCOUNTER — Ambulatory Visit: Payer: Self-pay | Admitting: Cardiovascular Disease

## 2024-04-08 ENCOUNTER — Ambulatory Visit

## 2024-04-08 DIAGNOSIS — I480 Paroxysmal atrial fibrillation: Secondary | ICD-10-CM

## 2024-04-09 LAB — CUP PACEART REMOTE DEVICE CHECK
Date Time Interrogation Session: 20260117231255
Implantable Pulse Generator Implant Date: 20211103

## 2024-04-10 NOTE — Progress Notes (Signed)
 Remote Loop Recorder Transmission

## 2024-04-17 ENCOUNTER — Ambulatory Visit: Payer: Self-pay | Admitting: Cardiovascular Disease

## 2024-05-09 ENCOUNTER — Encounter

## 2024-06-09 ENCOUNTER — Encounter

## 2024-06-12 ENCOUNTER — Institutional Professional Consult (permissible substitution): Admitting: Psychology

## 2024-06-12 ENCOUNTER — Ambulatory Visit: Payer: Self-pay

## 2024-06-13 ENCOUNTER — Ambulatory Visit: Payer: Self-pay

## 2024-06-13 ENCOUNTER — Institutional Professional Consult (permissible substitution): Admitting: Psychology

## 2024-06-20 ENCOUNTER — Encounter: Admitting: Psychology

## 2024-07-09 ENCOUNTER — Ambulatory Visit: Admitting: Physician Assistant
